# Patient Record
Sex: Male | Born: 1950
Health system: Southern US, Community
[De-identification: ages and names within clinical notes are randomized; demographics above are authoritative.]

## PROBLEM LIST (undated history)

## (undated) DIAGNOSIS — C801 Malignant (primary) neoplasm, unspecified: Secondary | ICD-10-CM

## (undated) DIAGNOSIS — Z973 Presence of spectacles and contact lenses: Secondary | ICD-10-CM

## (undated) DIAGNOSIS — I10 Essential (primary) hypertension: Secondary | ICD-10-CM

## (undated) DIAGNOSIS — K760 Fatty (change of) liver, not elsewhere classified: Secondary | ICD-10-CM

## (undated) DIAGNOSIS — Z8619 Personal history of other infectious and parasitic diseases: Secondary | ICD-10-CM

## (undated) DIAGNOSIS — I2510 Atherosclerotic heart disease of native coronary artery without angina pectoris: Secondary | ICD-10-CM

## (undated) DIAGNOSIS — J302 Other seasonal allergic rhinitis: Secondary | ICD-10-CM

## (undated) DIAGNOSIS — Z87891 Personal history of nicotine dependence: Secondary | ICD-10-CM

## (undated) DIAGNOSIS — E119 Type 2 diabetes mellitus without complications: Secondary | ICD-10-CM

## (undated) DIAGNOSIS — G4733 Obstructive sleep apnea (adult) (pediatric): Secondary | ICD-10-CM

## (undated) DIAGNOSIS — R0902 Hypoxemia: Secondary | ICD-10-CM

## (undated) DIAGNOSIS — Z972 Presence of dental prosthetic device (complete) (partial): Secondary | ICD-10-CM

## (undated) DIAGNOSIS — M1990 Unspecified osteoarthritis, unspecified site: Secondary | ICD-10-CM

## (undated) DIAGNOSIS — H269 Unspecified cataract: Secondary | ICD-10-CM

## (undated) DIAGNOSIS — M8888 Osteitis deformans of other bones: Secondary | ICD-10-CM

## (undated) DIAGNOSIS — R351 Nocturia: Secondary | ICD-10-CM

## (undated) DIAGNOSIS — G4730 Sleep apnea, unspecified: Secondary | ICD-10-CM

## (undated) DIAGNOSIS — K219 Gastro-esophageal reflux disease without esophagitis: Secondary | ICD-10-CM

## (undated) DIAGNOSIS — I219 Acute myocardial infarction, unspecified: Secondary | ICD-10-CM

## (undated) DIAGNOSIS — K635 Polyp of colon: Secondary | ICD-10-CM

## (undated) DIAGNOSIS — I639 Cerebral infarction, unspecified: Secondary | ICD-10-CM

## (undated) DIAGNOSIS — E785 Hyperlipidemia, unspecified: Secondary | ICD-10-CM

## (undated) DIAGNOSIS — J3089 Other allergic rhinitis: Secondary | ICD-10-CM

## (undated) DIAGNOSIS — R21 Rash and other nonspecific skin eruption: Secondary | ICD-10-CM

## (undated) HISTORY — DX: Sleep apnea, unspecified: G47.30

## (undated) HISTORY — DX: Personal history of nicotine dependence: Z87.891

## (undated) HISTORY — DX: Fatty (change of) liver, not elsewhere classified: K76.0

## (undated) HISTORY — PX: ABDOMINAL HERNIA REPAIR: SHX539

## (undated) HISTORY — PX: DENTAL SURGERY: SHX609

## (undated) HISTORY — DX: Atherosclerotic heart disease of native coronary artery without angina pectoris: I25.10

## (undated) HISTORY — DX: Unspecified cataract: H26.9

## (undated) HISTORY — DX: Malignant (primary) neoplasm, unspecified: C80.1

## (undated) HISTORY — DX: Acute myocardial infarction, unspecified: I21.9

## (undated) HISTORY — DX: Personal history of other infectious and parasitic diseases: Z86.19

## (undated) HISTORY — DX: Hyperlipidemia, unspecified: E78.5

## (undated) HISTORY — PX: TONSILLECTOMY: SUR1361

## (undated) HISTORY — PX: APPENDECTOMY: SHX54

## (undated) HISTORY — DX: Hypoxemia: R09.02

## (undated) HISTORY — DX: Osteitis deformans of other bones: M88.88

## (undated) HISTORY — PX: CARDIAC CATHETERIZATION: SHX172

## (undated) HISTORY — DX: Polyp of colon: K63.5

## (undated) HISTORY — DX: Essential (primary) hypertension: I10

## (undated) SURGERY — ANTERIOR CERVICAL DECOMPRESSION/DISCECTOMY FUSION 4 LEVELS

## (undated) SURGERY — LEFT HEART CATH AND CORONARY ANGIOGRAPHY
Anesthesia: Moderate Sedation

## (undated) MED FILL — Dulaglutide Soln Auto-injector 0.75 MG/0.5ML: SUBCUTANEOUS | Fill #6 | Status: CN

---

## 1990-01-29 HISTORY — PX: CERVICAL FUSION: SHX112

## 2004-01-30 HISTORY — PX: CHOLECYSTECTOMY OPEN: SUR202

## 2006-05-13 ENCOUNTER — Ambulatory Visit: Payer: Self-pay | Admitting: Cardiovascular Disease

## 2006-09-13 ENCOUNTER — Ambulatory Visit: Payer: Self-pay | Admitting: Surgery

## 2006-09-16 ENCOUNTER — Ambulatory Visit: Payer: Self-pay | Admitting: Surgery

## 2007-10-03 ENCOUNTER — Encounter: Payer: Self-pay | Admitting: Cardiology

## 2007-10-03 LAB — CONVERTED CEMR LAB
Cholesterol: 138 mg/dL
HDL: 28 mg/dL
LDL Cholesterol: 62 mg/dL
Triglyceride fasting, serum: 192 mg/dL

## 2007-12-02 ENCOUNTER — Ambulatory Visit: Payer: Self-pay | Admitting: Cardiology

## 2007-12-12 ENCOUNTER — Ambulatory Visit: Payer: Self-pay

## 2007-12-22 ENCOUNTER — Ambulatory Visit: Payer: Self-pay | Admitting: Cardiology

## 2007-12-22 LAB — CONVERTED CEMR LAB
BUN: 22 mg/dL (ref 6–23)
CO2: 26 meq/L (ref 19–32)
Calcium: 9.8 mg/dL (ref 8.4–10.5)
Chloride: 102 meq/L (ref 96–112)
Creatinine, Ser: 1.08 mg/dL (ref 0.40–1.50)
Glucose, Bld: 125 mg/dL — ABNORMAL HIGH (ref 70–99)
HCT: 48.3 % (ref 39.0–52.0)
Hemoglobin: 16.4 g/dL (ref 13.0–17.0)
INR: 0.9 (ref 0.0–1.5)
MCHC: 34 g/dL (ref 30.0–36.0)
MCV: 98.4 fL (ref 78.0–100.0)
Platelets: 285 10*3/uL (ref 150–400)
Potassium: 4.5 meq/L (ref 3.5–5.3)
Prothrombin Time: 12.2 s (ref 11.6–15.2)
RBC: 4.91 M/uL (ref 4.22–5.81)
RDW: 12.5 % (ref 11.5–15.5)
Sodium: 141 meq/L (ref 135–145)
WBC: 8.1 10*3/uL (ref 4.0–10.5)

## 2007-12-23 ENCOUNTER — Ambulatory Visit: Payer: Self-pay | Admitting: Cardiology

## 2008-02-03 ENCOUNTER — Ambulatory Visit: Payer: Self-pay | Admitting: Cardiology

## 2008-02-24 ENCOUNTER — Ambulatory Visit: Payer: Self-pay | Admitting: General Surgery

## 2008-12-14 ENCOUNTER — Encounter: Payer: Self-pay | Admitting: Cardiology

## 2009-02-15 ENCOUNTER — Ambulatory Visit: Payer: Self-pay | Admitting: Cardiology

## 2009-02-15 DIAGNOSIS — R0789 Other chest pain: Secondary | ICD-10-CM | POA: Insufficient documentation

## 2009-02-15 DIAGNOSIS — R5383 Other fatigue: Secondary | ICD-10-CM

## 2009-02-15 DIAGNOSIS — I2510 Atherosclerotic heart disease of native coronary artery without angina pectoris: Secondary | ICD-10-CM | POA: Insufficient documentation

## 2009-02-15 DIAGNOSIS — I200 Unstable angina: Secondary | ICD-10-CM | POA: Insufficient documentation

## 2009-02-15 DIAGNOSIS — I10 Essential (primary) hypertension: Secondary | ICD-10-CM | POA: Insufficient documentation

## 2009-02-15 DIAGNOSIS — R0602 Shortness of breath: Secondary | ICD-10-CM | POA: Insufficient documentation

## 2009-02-15 DIAGNOSIS — R5381 Other malaise: Secondary | ICD-10-CM | POA: Insufficient documentation

## 2009-02-15 DIAGNOSIS — I209 Angina pectoris, unspecified: Secondary | ICD-10-CM | POA: Insufficient documentation

## 2010-01-19 ENCOUNTER — Encounter: Payer: Self-pay | Admitting: Cardiovascular Disease

## 2010-02-14 ENCOUNTER — Ambulatory Visit
Admission: RE | Admit: 2010-02-14 | Discharge: 2010-02-14 | Payer: Self-pay | Source: Home / Self Care | Attending: Cardiovascular Disease | Admitting: Cardiovascular Disease

## 2010-02-14 ENCOUNTER — Encounter: Payer: Self-pay | Admitting: Cardiovascular Disease

## 2010-02-28 NOTE — Assessment & Plan Note (Signed)
Summary: F1Y/AMD   Visit Type:  Follow-up Primary Provider:  Gelene Mink, MD  CC:  no cp and maybe once a month maybe a touch of pressure. no sob. no edema.  History of Present Illness: Casey Golden returns today for evaluation and management of his coronary artery disease, mixed hyperlipidemia, hypertension.  He occasionally has a little chest tightness in the center of his chest about the size of a half-dollar. It is not clearly exertion related. He denies any dyspnea on exertion or other ischemic equivalent.  He does have some occasional dyspepsia when he eats hot foods. October take p.r.n. Pepcid or ranitidine.  His weight is up and so is his blood pressure today. He had saw when he cooks. He seems to avoid other types of hidden salt.  Hip positive Myoview several years ago which resulted in a heart catheterization. He had nonobstructive disease. His normal left ventricular function.  Recent blood work showed total cholesterol 152, HDL 36, triglycerides 195, LDL 77, blood sugar 136.  Preventive Screening-Counseling & Management  Alcohol-Tobacco     Alcohol drinks/day: 0     Smoking Status: quit  Caffeine-Diet-Exercise     Caffeine use/day: 1     Does Patient Exercise: yes  Current Medications (verified): 1)  Simcor 1000-20 Mg Xr24h-Tab (Niacin-Simvastatin) .Marland Kitchen.. 1 By Mouth Once Daily 2)  Tricor 145 Mg Tabs (Fenofibrate) .Marland Kitchen.. 1 By Mouth Once Daily 3)  Testim 1 % Gel (Testosterone) .... As Directed 4)  Atenolol 25 Mg Tabs (Atenolol) .... Take One Tablet By Mouth Daily 5)  Aspirin 81 Mg Tbec (Aspirin) .... Take One Tablet By Mouth Daily 6)  Vitamin D 2000 Unit Tabs (Cholecalciferol) .Marland Kitchen.. 1 By Mouth Once Daily 7)  Cinnamon 500 Mg Caps (Cinnamon) .Marland Kitchen.. 1 By Mouth Two Times A Day 8)  Lovaza 1 Gm Caps (Omega-3-Acid Ethyl Esters) .... 2 By Mouth Two Times A Day 9)  Tylenol 325 Mg Tabs (Acetaminophen) .... As Needed 10)  Benadryl 25 Mg Caps (Diphenhydramine Hcl) .Marland Kitchen.. 1 By Mouth As  Needed 11)  Zegerid 40-1100 Mg Caps (Omeprazole-Sodium Bicarbonate) .Marland Kitchen.. 1 By Mouth As Needed - For Chest Tightness  Allergies (verified): 1)  ! Sulfa  Social History: Alcohol drinks/day:  0 Caffeine use/day:  1 Does Patient Exercise:  yes  Review of Systems       negative other than history of present illness  Vital Signs:  Patient profile:   60 year old male Height:      71 inches Weight:      264.50 pounds BMI:     37.02 Pulse rate:   65 / minute Pulse rhythm:   regular BP sitting:   162 / 80  (left arm) Cuff size:   large  Vitals Entered By: Charlena Cross, RN, BSN (February 15, 2009 10:31 AM)  Physical Exam  General:  obese.  obese.   Head:  normocephalic and atraumatic Eyes:  PERRLA/EOM intact; conjunctiva and lids normal. Mouth:  Teeth, gums and palate normal. Oral mucosa normal. Neck:  Neck supple, no JVD. No masses, thyromegaly or abnormal cervical nodes. Chest Shaquoya Cosper:  no deformities or breast masses noted Lungs:  Clear bilaterally to auscultation and percussion. Heart:  PMI difficult to appreciate, normal S1-S2, no S4, regular rate and rhythm, no carotid bruit Abdomen:  Bowel sounds positive; abdomen soft and non-tender without masses, organomegaly, or hernias noted. No hepatosplenomegaly. Msk:  Back normal, normal gait. Muscle strength and tone normal. Pulses:  pulses normal in all 4 extremities  Extremities:  No clubbing or cyanosis. Neurologic:  Alert and oriented x 3.   Problems:  Medical Problems Added: 1)  Dx of Hypertension, Unspecified  (ICD-401.9) 2)  Dx of Cad, Native Vessel  (ICD-414.01) 3)  Dx of Other Chest Pain  (ICD-786.59) 4)  Dx of Fatigue  (ICD-780.79) 5)  Dx of Shortness of Breath  (ICD-786.05) 6)  Dx of Cad  (ICD-414.00)  EKG  Procedure date:  02/15/2009  Findings:      normal sinus rhythm, normal EKG  Impression & Recommendations:  Problem # 1:  OTHER CHEST PAIN (ICD-786.59) Assessment New I do not think this is cardiac  or coronary related. I reviewed the symptoms of angina or acute cardiac syndrome. I do not see any value repeating a stress nuclear study since it was a false positive in the past. I'll see him back in one year. His updated medication list for this problem includes:    Atenolol 25 Mg Tabs (Atenolol) .Marland Kitchen... Take one tablet by mouth daily    Aspirin 81 Mg Tbec (Aspirin) .Marland Kitchen... Take one tablet by mouth daily  Problem # 2:  CAD, NATIVE VESSEL (ICD-414.01) Assessment: Unchanged  His updated medication list for this problem includes:    Atenolol 25 Mg Tabs (Atenolol) .Marland Kitchen... Take one tablet by mouth daily    Aspirin 81 Mg Tbec (Aspirin) .Marland Kitchen... Take one tablet by mouth daily  Problem # 3:  HYPERTENSION, UNSPECIFIED (ICD-401.9) Assessment: New His blood pressures clearly elevated currently systolically. I rechecked it with a large cuff in the left arm and we left. I spent about 15 minutes talking about dietary restriction and sodium, losing about 15-20 pounds which also helped his blood sugar, and walking about 3 hours per week. He has a blood pressure cuff and will continue to check it. I have told him if it runs above 140 that he needs medical treatment. He will follow with his primary care physician. His updated medication list for this problem includes:    Atenolol 25 Mg Tabs (Atenolol) .Marland Kitchen... Take one tablet by mouth daily    Aspirin 81 Mg Tbec (Aspirin) .Marland Kitchen... Take one tablet by mouth daily  Patient Instructions: 1)  Your physician recommends that you schedule a follow-up appointment in: 1 YEAR 2)  Your physician has requested that you regularly monitor and record your blood pressure readings at home.  Please use the same machine at the same time of day to check your readings and record them to bring to your follow-up visit.

## 2010-03-02 NOTE — Assessment & Plan Note (Signed)
Summary: F/U 6 months   Visit Type:  Follow-up Primary Provider:  Dr.Neimeyer  CC:  "doing well". Denies chest pain and SOB.  History of Present Illness: Casey Golden returns today for evaluation and management of his coronary artery disease, mixed hyperlipidemia, hypertension. he does report a syncopal episode many years ago while walking his dog. He was down for one minute. He had no warning. He has not had any further episodes since that time  Casey Golden reports that he is doing well. He is walking his dog on a frequent basis but no chest pain, no shortness of breath. He has no edema. Overall he has no new complaints. He did hurt his back lifting something.  EKG shows normal sinus rhythm with rate 57 beats per minute, no significant ST or T wave changes  He had a positive Myoview several years ago which resulted in a heart catheterization. He had nonobstructive disease. His normal left ventricular function.  Recent blood work showed total cholesterol 111, LDL 58, HDL 30, triglycerides 117  Current Medications (verified): 1)  Tricor 145 Mg Tabs (Fenofibrate) .Marland Kitchen.. 1 By Mouth Once Daily 2)  Testim 1 % Gel (Testosterone) .... As Directed 3)  Atenolol 25 Mg Tabs (Atenolol) .... Take One Tablet By Mouth Daily 4)  Aspirin 81 Mg Tbec (Aspirin) .... Take One Tablet By Mouth Daily 5)  Vitamin D 2000 Unit Tabs (Cholecalciferol) .Marland Kitchen.. 1 By Mouth Once Daily 6)  Lovaza 1 Gm Caps (Omega-3-Acid Ethyl Esters) .... 2 By Mouth Two Times A Day 7)  Benadryl 25 Mg Caps (Diphenhydramine Hcl) .Marland Kitchen.. 1 By Mouth As Needed 8)  Zegerid 40-1100 Mg Caps (Omeprazole-Sodium Bicarbonate) .Marland Kitchen.. 1 By Mouth As Needed - For Chest Tightness 9)  Lipitor 40 Mg Tabs (Atorvastatin Calcium) .Marland Kitchen.. 1 Tablet Daily 10)  Lisinopril 5 Mg Tabs (Lisinopril) .Marland Kitchen.. 1 Tablet Daily 11)  Metformin Hcl 500 Mg Tabs (Metformin Hcl) .Marland Kitchen.. 1 Tablet Two Times A Day 12)  Centrum Silver  Tabs (Multiple Vitamins-Minerals) .Marland Kitchen.. 1 Tablet Daily  Allergies  (verified): 1)  ! Sulfa  Past History:  Past Medical History: Last updated: 12/14/2008 Hyperlipidemia Hypertension  Past Surgical History: Last updated: 12/14/2008 Appendectomy Cardiac Catheterization x2 Cholecystectomy Tonsillectomy  Family History: Last updated: 12/14/2008 negative for premature coronary disease  Social History: Last updated: 12/14/2008 Full Time Married  Tobacco Use - Former: quit 1985 Alcohol Use - no Drug Use - no  Risk Factors: Alcohol Use: 0 (02/15/2009) Caffeine Use: 1 (02/15/2009) Exercise: yes (02/15/2009)  Risk Factors: Smoking Status: quit (02/15/2009)  Review of Systems  The patient denies fever, weight loss, weight gain, vision loss, decreased hearing, hoarseness, chest pain, syncope, dyspnea on exertion, peripheral edema, prolonged cough, abdominal pain, incontinence, muscle weakness, depression, and enlarged lymph nodes.         back pain  Vital Signs:  Patient profile:   60 year old male Height:      71 inches Weight:      263.75 pounds BMI:     36.92 Pulse rate:   57 / minute BP sitting:   153 / 79  (left arm) Cuff size:   large  Vitals Entered By: Lysbeth Galas CMA (February 14, 2010 10:08 AM)  Physical Exam  General:  Well developed, well nourished, in no acute distress. Head:  normocephalic and atraumatic Neck:  Neck supple, no JVD. No masses, thyromegaly or abnormal cervical nodes. Lungs:  Clear bilaterally to auscultation and percussion. Heart:  Non-displaced PMI, chest non-tender; regular rate  and rhythm, S1, S2 without murmurs, rubs or gallops. Carotid upstroke normal, no bruit.  Pedals normal pulses. No edema, no varicosities. Abdomen:  Bowel sounds positive; abdomen soft and non-tender without masses Msk:  Back normal, normal gait. Muscle strength and tone normal. Pulses:  pulses normal in all 4 extremities Extremities:  No clubbing or cyanosis. Neurologic:  Alert and oriented x 3. Skin:  Intact without  lesions or rashes. Psych:  Normal affect.   Impression & Recommendations:  Problem # 1:  CAD, NATIVE VESSEL (ICD-414.01)  mild nonobstructive coronary artery disease by remote cardiac catheterization. Continue aggressive medical management. Encouraged exercise and weight loss. Diabetes is under excellent control with hemoglobin A1c 6.3. He is on aspirin. Nonsmoker..  His updated medication list for this problem includes:    Atenolol 25 Mg Tabs (Atenolol) .Marland Kitchen... Take one tablet by mouth daily    Aspirin 81 Mg Tbec (Aspirin) .Marland Kitchen... Take one tablet by mouth daily    Lisinopril 5 Mg Tabs (Lisinopril) .Marland Kitchen... 1 tablet daily  Orders: EKG w/ Interpretation (93000)  His updated medication list for this problem includes:    Atenolol 25 Mg Tabs (Atenolol) .Marland Kitchen... Take one tablet by mouth daily    Aspirin 81 Mg Tbec (Aspirin) .Marland Kitchen... Take one tablet by mouth daily    Lisinopril 5 Mg Tabs (Lisinopril) .Marland Kitchen... 1 tablet daily  Problem # 2:  HYPERTENSION, UNSPECIFIED (ICD-401.9)  Repeat blood pressure was 140/70. Her last and to monitor his blood pressure at home and call us for systolic pressures greater than 135. We would increase his lisinopril if needed.  His updated medication list for this problem includes:    Atenolol 25 Mg Tabs (Atenolol) .Marland Kitchen... Take one tablet by mouth daily    Aspirin 81 Mg Tbec (Aspirin) .Marland Kitchen... Take one tablet by mouth daily    Lisinopril 5 Mg Tabs (Lisinopril) .Marland Kitchen... 1 tablet daily  His updated medication list for this problem includes:    Atenolol 25 Mg Tabs (Atenolol) .Marland Kitchen... Take one tablet by mouth daily    Aspirin 81 Mg Tbec (Aspirin) .Marland Kitchen... Take one tablet by mouth daily    Lisinopril 5 Mg Tabs (Lisinopril) .Marland Kitchen... 1 tablet daily  Problem # 3:  HYPERLIPIDEMIA-MIXED (ICD-272.4)  Cholesterol is under excellent control. Continue Lipitor.  The following medications were removed from the medication list:    Simcor 1000-20 Mg Xr24h-tab (Niacin-simvastatin) .Marland Kitchen... 1 by mouth  once daily His updated medication list for this problem includes:    Tricor 145 Mg Tabs (Fenofibrate) .Marland Kitchen... 1 by mouth once daily    Lovaza 1 Gm Caps (Omega-3-acid ethyl esters) .Marland Kitchen... 2 by mouth two times a day    Lipitor 40 Mg Tabs (Atorvastatin calcium) .Marland Kitchen... 1 tablet daily  The following medications were removed from the medication list:    Simcor 1000-20 Mg Xr24h-tab (Niacin-simvastatin) .Marland Kitchen... 1 by mouth once daily His updated medication list for this problem includes:    Tricor 145 Mg Tabs (Fenofibrate) .Marland Kitchen... 1 by mouth once daily    Lovaza 1 Gm Caps (Omega-3-acid ethyl esters) .Marland Kitchen... 2 by mouth two times a day    Lipitor 40 Mg Tabs (Atorvastatin calcium) .Marland Kitchen... 1 tablet daily  Patient Instructions: 1)  Your physician recommends that you schedule a follow-up appointment in: 1 year 2)  Your physician recommends that you continue on your current medications as directed. Please refer to the Current Medication list given to you today.

## 2010-06-13 NOTE — Assessment & Plan Note (Signed)
[facility] OFFICE NOTE   NAME:LEESherard, Sutch                          MRN:          [id number]  DATE:12/02/2007                            DOB:          May 31, 1950    I was asked by Dr. Gelene Mink to consult on Gillermo Murdoch with chest  discomfort and shortness of breath.   HISTORY OF PRESENT ILLNESS:  [name] is a 60 year old married white male  who has a history of coronary disease.  He has been having exertional  chest tightness with excess fatigue and shortness of breath.  This has  been going on for several months.  Interestingly, it does not happen on  a daily basis.  He has good days and bad days as he puts it.   He denies any orthopnea, PND, palpitations, presyncope, or syncope.  He  has had no peripheral edema.   Looking back through his records, he has had a catheterization at  [facility] in April 2008.  At that time, he had a  50% right coronary artery stenosis with normal circumflex and normal  right coronary artery.  His EF was 65% with normal left ventricular  function.  Prior to that catheterization, there was a question of an  inferior scar on the stress nuclear study.  In addition, he had a 2-D  echocardiogram that was normal.   He is intolerant of SULFA.   He does not smoke anymore.  He quit about 15 years ago.  He does not  drink alcohol.  He drinks very little coffee.  He enjoys walking 2-4  miles per day.   His current meds are:  1. Simcor 1000 mg/20 daily.  2. Tricor 145 mg a day.  3. Atenolol 25 mg a day.  4. Lovaza 4 daily.  5. Aspirin 81 mg a day.  6. Vitamin D 2000 units a day.   He has had a previous cholecystectomy, 2 catheterizations in the past  with no intervention, tonsillectomy, and appendectomy.   His history is significant for hypertension and hyperlipidemia.   Family history is negative for premature coronary disease.   SOCIAL HISTORY:   He is married and has 3 children.  He works at [employer] in [employer].   His review of systems are totally negative other than the HPI.  All  systems were queried.   PHYSICAL EXAMINATION:  VITAL SIGNS:  His blood pressure today is 146/86,  his pulse is 68 and regular.  He is 5 feet 11 inches, weighs 262 pounds.  HEENT:  Normal.  He wears glasses.  He has a ruddy complexion.  SKIN:  Warm and dry, otherwise.  NECK:  Supple.  Carotid upstrokes were equal bilateral bruits without  JVD.  Thyroid is not enlarged.  Trachea is midline.  LUNGS:  Clear to auscultation and percussion.  HEART:  Poorly appreciated PMI.  He is a big, muscular, thick male in  the chest.  He has a soft S1 and S2.  No murmur, rub, or gallop.  ABDOMEN:  Protuberant,  good bowel sounds.  No obvious midline bruit or  hepatomegaly.  There is no pulsatile mass.  EXTREMITIES:  There were no cyanosis, clubbing, or edema.  Pulses were  bilaterally symmetrical and present.  There was no sign of DVT.  NEUROLOGIC:  Intact.   An EKG in [facility] office on October 03, 2007, showed normal sinus  rhythm with first-degree AV block, but no sign of previous infarct.   ASSESSMENT AND PLAN:  Mr. Marcott has inconsistent symptoms of coronary  ischemia with exertion.  He does have a history of 50% right coronary  artery stenosis with normal left ventricular function in April 2008 by a  cardiac catheterization at [facility].  After a  long discussion, I think a stress nuclear study would be helpful.  If he  has inferior wall thinning, normal left ventricular function, no  significant ischemia, we would just reassure him and maintain current  secondary preventative strategies.   After a long discussion, he agrees to proceed.  We will arrange for this  in the next week or so.     Thomas C. Daleen Squibb, MD, [facility]  Electronically Signed    TCW/MedQ  DD: 12/02/2007  DT: 12/02/2007  Job #: [id number]   cc:   Galen Daft. Timoteo Gaul, MD

## 2010-06-13 NOTE — Assessment & Plan Note (Signed)
[facility] OFFICE NOTE   NAME:Casey Golden, Zimmerle                          MRN:          [id number]  DATE:02/03/2008                            DOB:          Mar 05, 1950    [name] returns today for followup after having a cardiac  catheterization.  I saw him on December 02, 2007.  He had had some  exertional chest tightness and excess fatigue and shortness of breath.  He had known coronary artery disease with a 50% lesion in the right  coronary artery.   We performed a stress Myoview, which showed relatively good exercise  tolerance; however, he did have some inferior wall ischemia at the mid  and basal level.  He had a hypertensive blood pressure response to  exercise.  His EF was 65%.   For this reason, he underwent cardiac catheterization at [facility] on December 23, 2007.  This showed a 40% mid right  coronary artery stenosis with nonobstructive and no significant plaque  in his left system.  His EF was 55%.   He has had no problems with the cath site.  He is on excellent medical  program for secondary prevention.   His blood pressure today is 146/88, his pulse is 74 and regular, his  weight is 264.  Rest of the exam is unchanged.   I spent 10 minutes or so talking to Mr. Pieczynski about secondary prevention  including decreasing plaque progression as well as the risk of plaque  rupture.  We also talked about acute coronary syndromes and their  associated symptoms, and how to respond with 911.   Assuming he is stable, we will see him back in a year.     Thomas C. Daleen Squibb, MD, [facility]  Electronically Signed    TCW/MedQ  DD: 02/03/2008  DT: 02/04/2008  Job #: [id number]   cc:   Dr. Gelene Mink  [facility]

## 2010-07-14 ENCOUNTER — Encounter: Payer: Self-pay | Admitting: Cardiovascular Disease

## 2011-03-12 ENCOUNTER — Ambulatory Visit: Payer: Self-pay | Admitting: Cardiovascular Disease

## 2011-05-01 ENCOUNTER — Ambulatory Visit (INDEPENDENT_AMBULATORY_CARE_PROVIDER_SITE_OTHER): Payer: BC Managed Care – PPO | Admitting: Cardiovascular Disease

## 2011-05-01 ENCOUNTER — Encounter: Payer: Self-pay | Admitting: Cardiovascular Disease

## 2011-05-01 VITALS — BP 158/90 | HR 64 | Resp 18 | Ht 71.0 in | Wt 240.1 lb

## 2011-05-01 DIAGNOSIS — I10 Essential (primary) hypertension: Secondary | ICD-10-CM

## 2011-05-01 DIAGNOSIS — R0602 Shortness of breath: Secondary | ICD-10-CM

## 2011-05-01 DIAGNOSIS — R0789 Other chest pain: Secondary | ICD-10-CM

## 2011-05-01 DIAGNOSIS — I2510 Atherosclerotic heart disease of native coronary artery without angina pectoris: Secondary | ICD-10-CM

## 2011-05-01 DIAGNOSIS — E785 Hyperlipidemia, unspecified: Secondary | ICD-10-CM

## 2011-05-01 MED ORDER — LISINOPRIL 10 MG PO TABS: 10.0000 mg | ORAL_TABLET | Freq: Every day | ORAL | Status: DC

## 2011-05-01 NOTE — Assessment & Plan Note (Signed)
Cholesterol is at goal on the current lipid regimen. No changes to the medications were made.  

## 2011-05-01 NOTE — Assessment & Plan Note (Signed)
Currently with no symptoms of angina. No further workup at this time. Continue current medication regimen. 

## 2011-05-01 NOTE — Progress Notes (Signed)
Patient ID: Casey Golden, male    DOB: 11-05-1950, 61 y.o.   MRN: [id number]  HPI Comments: Casey Golden returns today for evaluation and management of his coronary artery disease, history of smoking for 27 years,  mixed hyperlipidemia, hypertension. he does report a syncopal episode many years ago while walking his dog. He was down for one minute. He had no warning. He has not had any further episodes since that time   Casey Golden reports that he is doing well. He is walking his dog on a frequent basis but no chest pain, no shortness of breath. He has no edema. Overall he has no new complaints. he has started to use a total gym at home for exercise. Blood pressure is typically well controlled at home.   EKG shows normal sinus rhythm with rate 60 beats per minute, no significant ST or T wave changes, poor R wave progression through the anterior precordial leads   He had a positive Myoview several years ago which resulted in a heart catheterization. He had nonobstructive disease. His normal left ventricular function.   total cholesterol 111, LDL 58, HDL 30, triglycerides 117      Outpatient Encounter Prescriptions as of 05/01/2011  Medication Sig Dispense Refill  . aspirin 81 MG EC tablet Take 81 mg by mouth daily.        Marland Kitchen atenolol (TENORMIN) 25 MG tablet Take 25 mg by mouth daily.        Marland Kitchen atorvastatin (LIPITOR) 40 MG tablet Take 40 mg by mouth daily. Take 1/2 tablet by mouth daily.       . Cholecalciferol (VITAMIN D) 2000 UNITS tablet Take 2,000 Units by mouth daily.        . fenofibrate (TRICOR) 145 MG tablet Take 145 mg by mouth daily.        Marland Kitchen lisinopril (PRINIVIL,ZESTRIL) 10 MG tablet Take 1 tablet (10 mg total) by mouth daily.  90 tablet  3  . metFORMIN (GLUCOPHAGE) 500 MG tablet Take 500 mg by mouth 2 (two) times daily with a meal.        . omega-3 acid ethyl esters (LOVAZA) 1 G capsule Take 2 capsules by mouth 2 (two) times daily.        Marland Kitchen DISCONTD: lisinopril (PRINIVIL,ZESTRIL) 5 MG tablet  Take 5 mg by mouth daily.        . diphenhydrAMINE (SOMINEX) 25 MG tablet Take 25 mg by mouth as needed.        . Multiple Vitamins-Minerals (CENTRUM SILVER) tablet Take 1 tablet by mouth daily.        Marland Kitchen omeprazole-sodium bicarbonate (ZEGERID) 40-1100 MG per capsule Take 1 capsule by mouth as needed.        . testosterone (TESTIM) 50 MG/5GM GEL Place 5 g onto the skin as directed.           Review of Systems  Constitutional: Negative.   HENT: Negative.   Eyes: Negative.   Respiratory: Negative.   Cardiovascular: Negative.   Gastrointestinal: Negative.   Musculoskeletal: Negative.   Skin: Negative.   Neurological: Negative.   Hematological: Negative.   Psychiatric/Behavioral: Negative.   All other systems reviewed and are negative.    BP 158/90  Pulse 64  Resp 18  Ht 5\' 11"  (1.803 m)  Wt 240 lb 1.9 oz (108.918 kg)  BMI 33.49 kg/m2 He reports blood pressure is elevated because he was arguing with his wife this morning Physical Exam  Nursing note and vitals reviewed.  Constitutional: He is oriented to person, place, and time. He appears well-developed and well-nourished.  HENT:  Head: Normocephalic.  Nose: Nose normal.  Mouth/Throat: Oropharynx is clear and moist.  Eyes: Conjunctivae are normal. Pupils are equal, round, and reactive to light.  Neck: Normal range of motion. Neck supple. No JVD present.  Cardiovascular: Normal rate, regular rhythm, S1 normal, S2 normal, normal heart sounds and intact distal pulses.  Exam reveals no gallop and no friction rub.   No murmur heard. Pulmonary/Chest: Effort normal and breath sounds normal. No respiratory distress. He has no wheezes. He has no rales. He exhibits no tenderness.  Abdominal: Soft. Bowel sounds are normal. He exhibits no distension. There is no tenderness.  Musculoskeletal: Normal range of motion. He exhibits no edema and no tenderness.  Lymphadenopathy:    He has no cervical adenopathy.  Neurological: He is alert and  oriented to person, place, and time. Coordination normal.  Skin: Skin is warm and dry. No rash noted. No erythema.  Psychiatric: He has a normal mood and affect. His behavior is normal. Judgment and thought content normal.           Assessment and Plan

## 2011-05-01 NOTE — Assessment & Plan Note (Signed)
Blood pressure is elevated today which he attributes to an argument with his wife. Typically at home he reports better blood pressure. We have suggested he increase his lisinopril to 10 mg daily and monitor his blood pressure.

## 2011-05-01 NOTE — Patient Instructions (Signed)
You are doing well. No medication changes were made.  Please call us if you have new issues that need to be addressed before your next appt.  Your physician wants you to follow-up in: 12 months.  You will receive a reminder letter in the mail two months in advance. If you don't receive a letter, please call our office to schedule the follow-up appointment. 

## 2012-03-31 ENCOUNTER — Ambulatory Visit: Payer: BC Managed Care – PPO | Admitting: Adult Health

## 2012-04-25 ENCOUNTER — Other Ambulatory Visit: Payer: Self-pay | Admitting: Cardiovascular Disease

## 2012-04-25 NOTE — Telephone Encounter (Signed)
Refilled Lisinopril sent to [facility] pharmacy. 

## 2012-05-19 ENCOUNTER — Ambulatory Visit: Payer: BC Managed Care – PPO | Admitting: Cardiovascular Disease

## 2012-05-27 ENCOUNTER — Ambulatory Visit (INDEPENDENT_AMBULATORY_CARE_PROVIDER_SITE_OTHER): Payer: Managed Care, Other (non HMO) | Admitting: Cardiovascular Disease

## 2012-05-27 ENCOUNTER — Encounter: Payer: Self-pay | Admitting: Cardiovascular Disease

## 2012-05-27 VITALS — BP 130/80 | HR 61 | Ht 71.0 in | Wt 258.5 lb

## 2012-05-27 DIAGNOSIS — R0789 Other chest pain: Secondary | ICD-10-CM

## 2012-05-27 DIAGNOSIS — E785 Hyperlipidemia, unspecified: Secondary | ICD-10-CM

## 2012-05-27 DIAGNOSIS — I10 Essential (primary) hypertension: Secondary | ICD-10-CM

## 2012-05-27 DIAGNOSIS — I2510 Atherosclerotic heart disease of native coronary artery without angina pectoris: Secondary | ICD-10-CM

## 2012-05-27 DIAGNOSIS — R0602 Shortness of breath: Secondary | ICD-10-CM

## 2012-05-27 MED ORDER — AZITHROMYCIN 250 MG PO TABS: ORAL_TABLET | ORAL | Status: DC

## 2012-05-27 MED ORDER — LISINOPRIL 10 MG PO TABS: 10.0000 mg | ORAL_TABLET | Freq: Every day | ORAL | Status: DC

## 2012-05-27 MED ORDER — ALBUTEROL SULFATE HFA 108 (90 BASE) MCG/ACT IN AERS: 2.0000 | INHALATION_SPRAY | Freq: Four times a day (QID) | RESPIRATORY_TRACT | Status: DC | PRN

## 2012-05-27 NOTE — Assessment & Plan Note (Signed)
Currently with no symptoms of angina. No further workup at this time. Continue current medication regimen. 

## 2012-05-27 NOTE — Assessment & Plan Note (Signed)
Recent shortness of breath consistent with bronchitis. Worsening cough. We'll start him on Z-Pak and give him albuterol inhaler as needed for chest tightness

## 2012-05-27 NOTE — Progress Notes (Signed)
Patient ID: Casey Golden, male    DOB: 1950-08-27, 62 y.o.   MRN: [id number]  HPI Comments: Casey Golden is a very pleasant 62 year old gentleman returns today for evaluation and management of his coronary artery disease, history of smoking for 27 years,  mixed hyperlipidemia, hypertension. he does report a syncopal episode many years ago while walking his dog. He was down for one minute. He had no warning. He has not had any further episodes since that time   Casey Golden has been very active. In the past week, has had worsening congestion, cough, chest tightness. In the past with similar symptoms he was diagnosed with bronchitis and required antibiotics. Also used inhalers in the past for similar symptoms and symptom relief. Significant cough keeping him awake. Denies any chest pain with exertion. Otherwise feels well.   EKG shows normal sinus rhythm with rate 61 beats per minute, no significant ST or T wave changes   He had a positive Myoview several years ago which resulted in a heart catheterization. He had nonobstructive disease. His normal left ventricular function.   total cholesterol 111, LDL 58, HDL 30, triglycerides 117      Outpatient Encounter Prescriptions as of 05/27/2012  Medication Sig Dispense Refill  . aspirin 81 MG EC tablet Take 81 mg by mouth daily.        Marland Kitchen atenolol (TENORMIN) 25 MG tablet Take 25 mg by mouth daily.        Marland Kitchen atorvastatin (LIPITOR) 40 MG tablet Take 20 mg by mouth daily.       . Cholecalciferol (VITAMIN D) 2000 UNITS tablet Take 2,000 Units by mouth daily.        . diphenhydrAMINE (SOMINEX) 25 MG tablet Take 25 mg by mouth as needed.        . fenofibrate (TRICOR) 145 MG tablet Take 145 mg by mouth daily.        Marland Kitchen lisinopril (PRINIVIL,ZESTRIL) 10 MG tablet TAKE 1 TABLET (10 MG TOTAL) BY MOUTH DAILY.  90 tablet  3  . metFORMIN (GLUCOPHAGE) 500 MG tablet Take 500 mg by mouth 2 (two) times daily with a meal.        . Multiple Vitamins-Minerals (CENTRUM SILVER) tablet  Take 1 tablet by mouth daily.        Marland Kitchen omega-3 acid ethyl esters (LOVAZA) 1 G capsule Take 2 capsules by mouth 2 (two) times daily.        . [DISCONTINUED] omeprazole-sodium bicarbonate (ZEGERID) 40-1100 MG per capsule Take 1 capsule by mouth as needed.        . [DISCONTINUED] testosterone (TESTIM) 50 MG/5GM GEL Place 5 g onto the skin as directed.         No facility-administered encounter medications on file as of 05/27/2012.     Review of Systems  Constitutional: Negative.   HENT: Negative.   Eyes: Negative.   Respiratory: Positive for cough, chest tightness and shortness of breath.   Cardiovascular: Negative.   Gastrointestinal: Negative.   Musculoskeletal: Negative.   Skin: Negative.   Neurological: Negative.   Psychiatric/Behavioral: Negative.   All other systems reviewed and are negative.    BP 130/80  Pulse 61  Ht 5\' 11"  (1.803 m)  Wt 258 lb 8 oz (117.255 kg)  BMI 36.07 kg/m2  Physical Exam  Nursing note and vitals reviewed. Constitutional: He is oriented to person, place, and time. He appears well-developed and well-nourished.  HENT:  Head: Normocephalic.  Nose: Nose normal.  Mouth/Throat: Oropharynx is  clear and moist.  Eyes: Conjunctivae are normal. Pupils are equal, round, and reactive to light.  Neck: Normal range of motion. Neck supple. No JVD present.  Cardiovascular: Normal rate, regular rhythm, S1 normal, S2 normal, normal heart sounds and intact distal pulses.  Exam reveals no gallop and no friction rub.   No murmur heard. Pulmonary/Chest: Effort normal. No respiratory distress. He has no wheezes. He has no rales. He exhibits no tenderness.  Coarse upper airway breath sounds bilaterally  Abdominal: Soft. Bowel sounds are normal. He exhibits no distension. There is no tenderness.  Musculoskeletal: Normal range of motion. He exhibits no edema and no tenderness.  Lymphadenopathy:    He has no cervical adenopathy.  Neurological: He is alert and oriented to  person, place, and time. Coordination normal.  Skin: Skin is warm and dry. No rash noted. No erythema.  Psychiatric: He has a normal mood and affect. His behavior is normal. Judgment and thought content normal.      Assessment and Plan

## 2012-05-27 NOTE — Patient Instructions (Addendum)
Please start Z-pak 2 the first day then one a day until you run out Use albuterol inhaler for shortness of breath  Please call us if you have new issues that need to be addressed before your next appt.  Your physician wants you to follow-up in: 6 months.  You will receive a reminder letter in the mail two months in advance. If you don't receive a letter, please call our office to schedule the follow-up appointment.

## 2012-05-27 NOTE — Assessment & Plan Note (Signed)
Blood pressure is well controlled on today's visit. No changes made to the medications. 

## 2012-05-27 NOTE — Assessment & Plan Note (Signed)
Cholesterol is at goal on the current lipid regimen. No changes to the medications were made.  

## 2012-09-16 ENCOUNTER — Ambulatory Visit: Payer: Self-pay | Admitting: Family Medicine

## 2012-09-19 ENCOUNTER — Encounter: Payer: Self-pay | Admitting: Cardiovascular Disease

## 2012-09-19 ENCOUNTER — Ambulatory Visit (INDEPENDENT_AMBULATORY_CARE_PROVIDER_SITE_OTHER): Payer: Managed Care, Other (non HMO) | Admitting: Cardiovascular Disease

## 2012-09-19 VITALS — BP 124/72 | HR 62 | Ht 71.0 in | Wt 255.0 lb

## 2012-09-19 DIAGNOSIS — R0602 Shortness of breath: Secondary | ICD-10-CM

## 2012-09-19 DIAGNOSIS — R0789 Other chest pain: Secondary | ICD-10-CM

## 2012-09-19 DIAGNOSIS — I2510 Atherosclerotic heart disease of native coronary artery without angina pectoris: Secondary | ICD-10-CM

## 2012-09-19 DIAGNOSIS — I10 Essential (primary) hypertension: Secondary | ICD-10-CM

## 2012-09-19 NOTE — Assessment & Plan Note (Signed)
Currently with no symptoms of angina. No further workup at this time. Continue current medication regimen. 

## 2012-09-19 NOTE — Assessment & Plan Note (Signed)
I suspect his shortness of breath could be from mild COPD. He did report previous improvement on Symbicort, symptoms resolved at that time. He is unable to stay on Symbicort secondary to price. Have asked him to talk with Dr. Suzie Portela about a substitute. Albuterol does help but for a short period of time.  If he continues to have shortness of breath despite inhalers, we mentioned that additional cardiac testing to be done such as stress testing. He reports that he sees Dr. Suzie Portela next week.

## 2012-09-19 NOTE — Progress Notes (Signed)
Patient ID: Casey Golden, male    DOB: Jul 29, 1950, 62 y.o.   MRN: [id number]  HPI Comments: Casey Golden is a very pleasant 62 year old gentleman returns today for evaluation and management of his coronary artery disease, history of smoking for 27 years,  mixed hyperlipidemia, hypertension. he does report a syncopal episode many years ago while walking his dog. He was down for one minute. He had no warning. He has not had any further episodes since that time   Casey Golden has been very active. He works at [employer]. He does report having mild chronic shortness of breath. Even at rest he feels slight tightness with breathing. He thinks that the breathing is "sapping his strength". He reports trying Symbicort in the past which improved his breathing. He is unable to stay on Symbicort secondary to price. He does use albuterol which helps his symptoms for a short period of time.  He reports recent LFT abnormality, Lipitor was held by Dr. Marguerite Olea.  EKG shows normal sinus rhythm with rate 62 beats per minute, no significant ST or T wave changes  He had a positive Myoview several years ago which resulted in a heart catheterization. He had nonobstructive disease. His normal left ventricular function. Prior lab work showing total cholesterol 111, LDL 58, HDL 30, triglycerides 117      Outpatient Encounter Prescriptions as of 09/19/2012  Medication Sig Dispense Refill  . albuterol (PROVENTIL HFA;VENTOLIN HFA) 108 (90 BASE) MCG/ACT inhaler Inhale 2 puffs into the lungs every 6 (six) hours as needed for wheezing.  1 Inhaler  2  . aspirin 81 MG EC tablet Take 81 mg by mouth daily.        Marland Kitchen atenolol (TENORMIN) 25 MG tablet Take 25 mg by mouth daily.        Marland Kitchen atorvastatin (LIPITOR) 40 MG tablet Take 20 mg by mouth daily.       . Cholecalciferol (VITAMIN D) 2000 UNITS tablet Take 5,000 Units by mouth daily.       . fenofibrate (TRICOR) 145 MG tablet Take 145 mg by mouth daily.        . metFORMIN (GLUCOPHAGE) 500 MG  tablet Take 500 mg by mouth 2 (two) times daily with a meal.        . omega-3 acid ethyl esters (LOVAZA) 1 G capsule Take 2 capsules by mouth 2 (two) times daily.        . [DISCONTINUED] azithromycin (ZITHROMAX) 250 MG tablet Please take two teh first day, then one a day  6 each  1  . [DISCONTINUED] diphenhydrAMINE (SOMINEX) 25 MG tablet Take 25 mg by mouth as needed.        Marland Kitchen lisinopril (PRINIVIL,ZESTRIL) 10 MG tablet Take 1 tablet (10 mg total) by mouth daily.  90 tablet  3  . [DISCONTINUED] Multiple Vitamins-Minerals (CENTRUM SILVER) tablet Take 1 tablet by mouth daily.         No facility-administered encounter medications on file as of 09/19/2012.     Review of Systems  Constitutional: Negative.   HENT: Negative.   Eyes: Negative.   Respiratory: Positive for shortness of breath.   Cardiovascular: Negative.   Gastrointestinal: Negative.   Musculoskeletal: Negative.   Skin: Negative.   Neurological: Negative.   Psychiatric/Behavioral: Negative.   All other systems reviewed and are negative.    BP 124/72  Pulse 62  Ht 5\' 11"  (1.803 m)  Wt 255 lb (115.667 kg)  BMI 35.58 kg/m2  Physical Exam  Nursing  note and vitals reviewed. Constitutional: He is oriented to person, place, and time. He appears well-developed and well-nourished.  HENT:  Head: Normocephalic.  Nose: Nose normal.  Mouth/Throat: Oropharynx is clear and moist.  Eyes: Conjunctivae are normal. Pupils are equal, round, and reactive to light.  Neck: Normal range of motion. Neck supple. No JVD present.  Cardiovascular: Normal rate, regular rhythm, S1 normal, S2 normal, normal heart sounds and intact distal pulses.  Exam reveals no gallop and no friction rub.   No murmur heard. Pulmonary/Chest: Effort normal and breath sounds normal. No respiratory distress. He has no wheezes. He has no rales. He exhibits no tenderness.  Coarse upper airway breath sounds bilaterally  Abdominal: Soft. Bowel sounds are normal. He  exhibits no distension. There is no tenderness.  Musculoskeletal: Normal range of motion. He exhibits no edema and no tenderness.  Lymphadenopathy:    He has no cervical adenopathy.  Neurological: He is alert and oriented to person, place, and time. Coordination normal.  Skin: Skin is warm and dry. No rash noted. No erythema.  Psychiatric: He has a normal mood and affect. His behavior is normal. Judgment and thought content normal.      Assessment and Plan

## 2012-09-19 NOTE — Assessment & Plan Note (Signed)
Blood pressure is well controlled on today's visit. No changes made to the medications. 

## 2012-09-19 NOTE — Patient Instructions (Addendum)
You are doing well. No medication changes were made.  Please call us if you have new issues that need to be addressed before your next appt.  Your physician wants you to follow-up in: 6 months.  You will receive a reminder letter in the mail two months in advance. If you don't receive a letter, please call our office to schedule the follow-up appointment.   

## 2012-09-26 ENCOUNTER — Encounter: Payer: Self-pay | Admitting: Cardiovascular Disease

## 2012-12-03 ENCOUNTER — Ambulatory Visit: Payer: Self-pay | Admitting: Podiatry

## 2012-12-04 ENCOUNTER — Other Ambulatory Visit: Payer: Self-pay

## 2013-01-01 DIAGNOSIS — Z85828 Personal history of other malignant neoplasm of skin: Secondary | ICD-10-CM | POA: Insufficient documentation

## 2013-01-29 DIAGNOSIS — K760 Fatty (change of) liver, not elsewhere classified: Secondary | ICD-10-CM

## 2013-01-29 HISTORY — PX: MOHS SURGERY: SUR867

## 2013-01-29 HISTORY — DX: Fatty (change of) liver, not elsewhere classified: K76.0

## 2013-03-12 ENCOUNTER — Ambulatory Visit: Payer: Self-pay

## 2013-03-12 ENCOUNTER — Ambulatory Visit: Payer: Self-pay | Admitting: Urgent Care

## 2013-03-12 LAB — RAPID INFLUENZA A&B ANTIGENS

## 2013-03-18 ENCOUNTER — Ambulatory Visit: Payer: Self-pay | Admitting: Physician Assistant

## 2013-05-11 ENCOUNTER — Encounter: Payer: Self-pay | Admitting: Cardiovascular Disease

## 2013-05-11 ENCOUNTER — Ambulatory Visit (INDEPENDENT_AMBULATORY_CARE_PROVIDER_SITE_OTHER): Payer: Managed Care, Other (non HMO) | Admitting: Cardiovascular Disease

## 2013-05-11 VITALS — BP 142/78 | HR 62 | Ht 71.0 in | Wt 254.8 lb

## 2013-05-11 DIAGNOSIS — R0602 Shortness of breath: Secondary | ICD-10-CM

## 2013-05-11 DIAGNOSIS — E785 Hyperlipidemia, unspecified: Secondary | ICD-10-CM

## 2013-05-11 DIAGNOSIS — I10 Essential (primary) hypertension: Secondary | ICD-10-CM

## 2013-05-11 DIAGNOSIS — I2510 Atherosclerotic heart disease of native coronary artery without angina pectoris: Secondary | ICD-10-CM

## 2013-05-11 MED ORDER — ATORVASTATIN CALCIUM 20 MG PO TABS: 20.0000 mg | ORAL_TABLET | Freq: Every day | ORAL | Status: DC

## 2013-05-11 NOTE — Assessment & Plan Note (Signed)
Cholesterol is at goal on the current lipid regimen. No changes to the medications were made.  

## 2013-05-11 NOTE — Assessment & Plan Note (Signed)
Chronic mild shortness of breath at baseline. Long-standing issue. Stable

## 2013-05-11 NOTE — Assessment & Plan Note (Signed)
Currently with no symptoms of angina. No further workup at this time. Continue current medication regimen. 

## 2013-05-11 NOTE — Progress Notes (Signed)
Patient ID: Casey Golden, male    DOB: Apr 08, 1950, 63 y.o.   MRN: [id number]  HPI Comments: Casey Golden is a very pleasant 63 year old gentleman returns today for evaluation and management of his coronary artery disease, history of smoking for 27 years,  mixed hyperlipidemia, hypertension. he does report a syncopal episode many years ago while walking his dog. He was down for one minute. He had no warning. He has not had any further episodes since that time   Casey. Golden has been very active. He works full-time at [employer] Plans on working several more years. On today's visit, reports having some sweating episodes at times, some reflux symptoms. Reports he was previously off his statin for elevated LFTs, now back on his Lipitor Was previously on Symbicort but this was expensive for him, now uses albuterol as needed Continues to have mild chronic shortness of breath  EKG shows normal sinus rhythm with rate 62 beats per minute, no significant ST or T wave changes  He had a positive Myoview several years ago which resulted in a heart catheterization. He had nonobstructive disease. His normal left ventricular function. Prior lab work showing total cholesterol 111, LDL 58, HDL 30, triglycerides 117 while on a statin      Outpatient Encounter Prescriptions as of 05/11/2013  Medication Sig  . albuterol (PROVENTIL HFA;VENTOLIN HFA) 108 (90 BASE) MCG/ACT inhaler Inhale 2 puffs into the lungs every 6 (six) hours as needed for wheezing.  Marland Kitchen aspirin 81 MG EC tablet Take 81 mg by mouth daily.    Marland Kitchen atenolol (TENORMIN) 25 MG tablet Take 25 mg by mouth daily.    Marland Kitchen atorvastatin (LIPITOR) 20 MG tablet Take 20 mg by mouth daily.  . cholecalciferol (VITAMIN D) 1000 UNITS tablet Take 1,500 Units by mouth daily.  . ergocalciferol (VITAMIN D2) 50000 UNITS capsule Take 50,000 Units by mouth once a week.  . fenofibrate (TRICOR) 145 MG tablet Take 145 mg by mouth daily.    Marland Kitchen lisinopril (PRINIVIL,ZESTRIL) 10 MG tablet Take 1  tablet (10 mg total) by mouth daily.  . metFORMIN (GLUCOPHAGE) 500 MG tablet Take 500 mg by mouth 2 (two) times daily with a meal.      Review of Systems  Constitutional: Negative.   HENT: Negative.   Eyes: Negative.   Respiratory: Positive for shortness of breath.   Cardiovascular: Negative.   Gastrointestinal: Negative.   Endocrine: Negative.   Musculoskeletal: Negative.   Skin: Negative.   Allergic/Immunologic: Negative.   Neurological: Negative.   Hematological: Negative.   Psychiatric/Behavioral: Negative.   All other systems reviewed and are negative.   BP 142/78  Pulse 62  Ht 5\' 11"  (1.803 m)  Wt 254 lb 12 oz (115.554 kg)  BMI 35.55 kg/m2  Physical Exam  Nursing note and vitals reviewed. Constitutional: He is oriented to person, place, and time. He appears well-developed and well-nourished.  HENT:  Head: Normocephalic.  Nose: Nose normal.  Mouth/Throat: Oropharynx is clear and moist.  Eyes: Conjunctivae are normal. Pupils are equal, round, and reactive to light.  Neck: Normal range of motion. Neck supple. No JVD present.  Cardiovascular: Normal rate, regular rhythm, S1 normal, S2 normal, normal heart sounds and intact distal pulses.  Exam reveals no gallop and no friction rub.   No murmur heard. Pulmonary/Chest: Effort normal and breath sounds normal. No respiratory distress. He has no wheezes. He has no rales. He exhibits no tenderness.  Abdominal: Soft. Bowel sounds are normal. He exhibits no distension.  There is no tenderness.  Musculoskeletal: Normal range of motion. He exhibits no edema and no tenderness.  Lymphadenopathy:    He has no cervical adenopathy.  Neurological: He is alert and oriented to person, place, and time. Coordination normal.  Skin: Skin is warm and dry. No rash noted. No erythema.  Psychiatric: He has a normal mood and affect. His behavior is normal. Judgment and thought content normal.      Assessment and Plan

## 2013-05-11 NOTE — Patient Instructions (Addendum)
You are doing well. No medication changes were made.  For heartburn, try pepcid/zantac with tums  Please call us if you have new issues that need to be addressed before your next appt.  Your physician wants you to follow-up in: 12 months.  You will receive a reminder letter in the mail two months in advance. If you don't receive a letter, please call our office to schedule the follow-up appointment.

## 2013-05-11 NOTE — Assessment & Plan Note (Signed)
Blood pressure is well controlled on today's visit. No changes made to the medications. 

## 2013-07-29 ENCOUNTER — Ambulatory Visit: Payer: Self-pay | Admitting: Otolaryngology

## 2013-09-16 ENCOUNTER — Ambulatory Visit: Payer: Self-pay | Admitting: Otolaryngology

## 2013-12-05 ENCOUNTER — Ambulatory Visit: Payer: Self-pay | Admitting: Family Medicine

## 2013-12-05 LAB — COMPREHENSIVE METABOLIC PANEL
Albumin: 3.7 g/dL (ref 3.4–5.0)
Alkaline Phosphatase: 60 U/L
Anion Gap: 7 (ref 7–16)
BUN: 19 mg/dL — ABNORMAL HIGH (ref 7–18)
Bilirubin,Total: 0.6 mg/dL (ref 0.2–1.0)
Calcium, Total: 9.2 mg/dL (ref 8.5–10.1)
Chloride: 101 mmol/L (ref 98–107)
Co2: 32 mmol/L (ref 21–32)
Creatinine: 1.29 mg/dL (ref 0.60–1.30)
EGFR (African American): >60
EGFR (Non-African Amer.): 59 — ABNORMAL LOW
Glucose: 157 mg/dL — ABNORMAL HIGH (ref 65–99)
Osmolality: 285 (ref 275–301)
Potassium: 4.3 mmol/L (ref 3.5–5.1)
SGOT(AST): 35 U/L (ref 15–37)
SGPT (ALT): 55 U/L
Sodium: 140 mmol/L (ref 136–145)
Total Protein: 7.3 g/dL (ref 6.4–8.2)

## 2013-12-05 LAB — CBC WITH DIFFERENTIAL/PLATELET
Basophil #: 0.1 10*3/uL (ref 0.0–0.1)
Basophil %: 0.8 %
Eosinophil #: 0.3 10*3/uL (ref 0.0–0.7)
Eosinophil %: 2.7 %
HCT: 46 % (ref 40.0–52.0)
HGB: 15.6 g/dL (ref 13.0–18.0)
Lymphocyte #: 1.6 10*3/uL (ref 1.0–3.6)
Lymphocyte %: 16.8 %
MCH: 32.9 pg (ref 26.0–34.0)
MCHC: 33.9 g/dL (ref 32.0–36.0)
MCV: 97 fL (ref 80–100)
Monocyte #: 0.9 x10 3/mm (ref 0.2–1.0)
Monocyte %: 9.1 %
Neutrophil #: 6.7 10*3/uL — ABNORMAL HIGH (ref 1.4–6.5)
Neutrophil %: 70.6 %
Platelet: 243 10*3/uL (ref 150–440)
RBC: 4.74 10*6/uL (ref 4.40–5.90)
RDW: 12 % (ref 11.5–14.5)
WBC: 9.6 10*3/uL (ref 3.8–10.6)

## 2013-12-05 LAB — URINALYSIS, COMPLETE
Bacteria: NEGATIVE
Bilirubin,UR: NEGATIVE
Blood: NEGATIVE
Glucose,UR: 250
Ketone: NEGATIVE
Leukocyte Esterase: NEGATIVE
Nitrite: NEGATIVE
Ph: 6 (ref 5.0–8.0)
Protein: NEGATIVE
Specific Gravity: 1.02 (ref 1.000–1.030)
WBC UR: NONE SEEN /HPF (ref 0–5)

## 2013-12-05 LAB — SEDIMENTATION RATE: Erythrocyte Sed Rate: 6 mm/hr (ref 0–20)

## 2013-12-07 LAB — URINE CULTURE

## 2014-04-08 ENCOUNTER — Other Ambulatory Visit: Payer: Self-pay | Admitting: Cardiovascular Disease

## 2014-05-06 DIAGNOSIS — E118 Type 2 diabetes mellitus with unspecified complications: Secondary | ICD-10-CM | POA: Insufficient documentation

## 2014-05-06 DIAGNOSIS — E785 Hyperlipidemia, unspecified: Secondary | ICD-10-CM

## 2014-05-06 DIAGNOSIS — I2510 Atherosclerotic heart disease of native coronary artery without angina pectoris: Secondary | ICD-10-CM | POA: Insufficient documentation

## 2014-05-06 DIAGNOSIS — Z1331 Encounter for screening for depression: Secondary | ICD-10-CM | POA: Insufficient documentation

## 2014-05-06 DIAGNOSIS — Z9181 History of falling: Secondary | ICD-10-CM | POA: Insufficient documentation

## 2014-05-06 DIAGNOSIS — E875 Hyperkalemia: Secondary | ICD-10-CM | POA: Insufficient documentation

## 2014-05-06 DIAGNOSIS — N5089 Other specified disorders of the male genital organs: Secondary | ICD-10-CM | POA: Insufficient documentation

## 2014-05-06 DIAGNOSIS — E559 Vitamin D deficiency, unspecified: Secondary | ICD-10-CM | POA: Insufficient documentation

## 2014-05-06 DIAGNOSIS — M79603 Pain in arm, unspecified: Secondary | ICD-10-CM | POA: Insufficient documentation

## 2014-05-06 DIAGNOSIS — G4733 Obstructive sleep apnea (adult) (pediatric): Secondary | ICD-10-CM | POA: Insufficient documentation

## 2014-05-06 DIAGNOSIS — I10 Essential (primary) hypertension: Secondary | ICD-10-CM | POA: Insufficient documentation

## 2014-05-06 DIAGNOSIS — E1169 Type 2 diabetes mellitus with other specified complication: Secondary | ICD-10-CM | POA: Insufficient documentation

## 2014-05-06 DIAGNOSIS — Z23 Encounter for immunization: Secondary | ICD-10-CM | POA: Insufficient documentation

## 2014-05-12 ENCOUNTER — Ambulatory Visit (INDEPENDENT_AMBULATORY_CARE_PROVIDER_SITE_OTHER): Payer: Managed Care, Other (non HMO) | Admitting: Cardiovascular Disease

## 2014-05-12 ENCOUNTER — Encounter: Payer: Self-pay | Admitting: Cardiovascular Disease

## 2014-05-12 VITALS — BP 140/80 | HR 68 | Ht 72.0 in | Wt 255.2 lb

## 2014-05-12 DIAGNOSIS — E785 Hyperlipidemia, unspecified: Secondary | ICD-10-CM | POA: Diagnosis not present

## 2014-05-12 DIAGNOSIS — I10 Essential (primary) hypertension: Secondary | ICD-10-CM | POA: Diagnosis not present

## 2014-05-12 DIAGNOSIS — R0602 Shortness of breath: Secondary | ICD-10-CM

## 2014-05-12 DIAGNOSIS — I25119 Atherosclerotic heart disease of native coronary artery with unspecified angina pectoris: Secondary | ICD-10-CM

## 2014-05-12 MED ORDER — ALBUTEROL SULFATE HFA 108 (90 BASE) MCG/ACT IN AERS: 2.0000 | INHALATION_SPRAY | Freq: Four times a day (QID) | RESPIRATORY_TRACT | Status: DC | PRN

## 2014-05-12 NOTE — Assessment & Plan Note (Signed)
Currently with no symptoms of angina. No further workup at this time. Continue current medication regimen. 

## 2014-05-12 NOTE — Assessment & Plan Note (Signed)
Currently not taking his statin per primary care. We will try to obtain a copy of his LFTs

## 2014-05-12 NOTE — Assessment & Plan Note (Signed)
Blood pressure is well controlled on today's visit. No changes made to the medications. 

## 2014-05-12 NOTE — Progress Notes (Signed)
Patient ID: Calil Amor, male    DOB: 1950/06/04, 64 y.o.   MRN: [id number]  HPI Comments: Mr Bourbon is a very pleasant 64 year old gentleman returns today for evaluation and management of his coronary artery disease, history of smoking for 27 years,  mixed hyperlipidemia, hypertension. he does report a syncopal episode many years ago while walking his dog. He was down for one minute. He had no warning. He has not had any further episodes since that time  Mr. Brisco continues to work  full-time at [employer] Plans on working several more years. Rare episodes of shortness of breath. Does not take any inhalers on a regular basis. Reports having elevated liver function test, statin held one month ago by primary care. No regular exercise program but does do significant activity through his work  EKG shows normal sinus rhythm with rate 68 beats per minute, no significant ST or T wave changes  Other past medical history Previously had some sweating episodes at times Was previously on Symbicort but this was expensive for him, now uses albuterol as needed  He had a positive Myoview several years ago which resulted in a heart catheterization. He had nonobstructive disease. His normal left ventricular function. Prior lab work showing total cholesterol 111, LDL 58, HDL 30, triglycerides 117 while on a statin     Allergies  Allergen Reactions  . Sulfonamide Derivatives Hives  . Sulfa Antibiotics     Outpatient Encounter Prescriptions as of 05/12/2014  Medication Sig  . albuterol (PROVENTIL HFA;VENTOLIN HFA) 108 (90 BASE) MCG/ACT inhaler Inhale 2 puffs into the lungs every 6 (six) hours as needed for wheezing.  Marland Kitchen amLODipine (NORVASC) 5 MG tablet Take by mouth.  Marland Kitchen aspirin 81 MG EC tablet Take 81 mg by mouth daily.    Marland Kitchen atenolol (TENORMIN) 25 MG tablet Take 25 mg by mouth daily.    . budesonide-formoterol (SYMBICORT) 160-4.5 MCG/ACT inhaler Inhale into the lungs.  . cholecalciferol (VITAMIN D) 1000  UNITS tablet Take 5,000 Units by mouth daily.   . Cholecalciferol 5000 UNITS capsule Take 5,000 Units by mouth 2 (two) times daily.   . ergocalciferol (VITAMIN D2) 50000 UNITS capsule Take 50,000 Units by mouth once a week.  Marland Kitchen lisinopril (PRINIVIL,ZESTRIL) 10 MG tablet Take 1 tablet (10 mg total) by mouth daily.  . metFORMIN (GLUCOPHAGE) 500 MG tablet Take 500 mg by mouth 2 (two) times daily with a meal.    . [DISCONTINUED] albuterol (PROVENTIL HFA;VENTOLIN HFA) 108 (90 BASE) MCG/ACT inhaler Inhale 2 puffs into the lungs every 6 (six) hours as needed for wheezing. (Patient not taking: Reported on 05/12/2014)  . [DISCONTINUED] aspirin 81 MG EC tablet Take by mouth.  . [DISCONTINUED] atenolol (TENORMIN) 25 MG tablet Take by mouth.  . [DISCONTINUED] atorvastatin (LIPITOR) 20 MG tablet TAKE 1 TABLET (20 MG TOTAL) BY MOUTH DAILY. (Patient not taking: Reported on 05/12/2014)  . [DISCONTINUED] fenofibrate (TRICOR) 145 MG tablet Take 145 mg by mouth daily.    . [DISCONTINUED] fenofibrate (TRICOR) 145 MG tablet Take by mouth.  . [DISCONTINUED] metFORMIN (GLUCOPHAGE) 500 MG tablet Take by mouth.    Past Medical History  Diagnosis Date  . Hyperlipidemia   . Hypertension     Past Surgical History  Procedure Laterality Date  . Appendectomy    . Cardiac catheterization      x2  . Cholecystectomy    . Tonsillectomy      Social History  reports that he quit smoking about 31 years  ago. He does not have any smokeless tobacco history on file. He reports that he does not drink alcohol or use illicit drugs.  Family History family history includes Heart attack in his mother; Heart disease in his mother. There is no history of Coronary artery disease.   Review of Systems  Constitutional: Negative.   Respiratory: Positive for shortness of breath.   Cardiovascular: Negative.   Gastrointestinal: Negative.   Musculoskeletal: Negative.   Skin: Negative.   Neurological: Negative.   Hematological:  Negative.   Psychiatric/Behavioral: Negative.   All other systems reviewed and are negative.   BP 140/80 mmHg  Pulse 68  Ht 6' (1.829 m)  Wt 255 lb 4 oz (115.781 kg)  BMI 34.61 kg/m2  Physical Exam  Constitutional: He is oriented to person, place, and time. He appears well-developed and well-nourished.  HENT:  Head: Normocephalic.  Nose: Nose normal.  Mouth/Throat: Oropharynx is clear and moist.  Eyes: Conjunctivae are normal. Pupils are equal, round, and reactive to light.  Neck: Normal range of motion. Neck supple. No JVD present.  Cardiovascular: Normal rate, regular rhythm, S1 normal, S2 normal, normal heart sounds and intact distal pulses.  Exam reveals no gallop and no friction rub.   No murmur heard. Pulmonary/Chest: Effort normal and breath sounds normal. No respiratory distress. He has no wheezes. He has no rales. He exhibits no tenderness.  Abdominal: Soft. Bowel sounds are normal. He exhibits no distension. There is no tenderness.  Musculoskeletal: Normal range of motion. He exhibits no edema or tenderness.  Lymphadenopathy:    He has no cervical adenopathy.  Neurological: He is alert and oriented to person, place, and time. Coordination normal.  Skin: Skin is warm and dry. No rash noted. No erythema.  Psychiatric: He has a normal mood and affect. His behavior is normal. Judgment and thought content normal.      Assessment and Plan   Nursing note and vitals reviewed.

## 2014-05-12 NOTE — Assessment & Plan Note (Signed)
Prior smoking history. Unable to afford Symbicort. Recommended he use albuterol as needed

## 2014-05-12 NOTE — Patient Instructions (Addendum)
You are doing well. No medication changes were made.  We will request you lab work  Please call us if you have new issues that need to be addressed before your next appt.  Your physician wants you to follow-up in: 12 months.  You will receive a reminder letter in the mail two months in advance. If you don't receive a letter, please call our office to schedule the follow-up appointment.

## 2014-07-21 ENCOUNTER — Ambulatory Visit (INDEPENDENT_AMBULATORY_CARE_PROVIDER_SITE_OTHER): Payer: Managed Care, Other (non HMO) | Admitting: Family Medicine

## 2014-07-21 ENCOUNTER — Encounter: Payer: Self-pay | Admitting: Family Medicine

## 2014-07-21 VITALS — BP 130/68 | HR 74 | Temp 97.6°F | Ht 72.0 in | Wt 256.4 lb

## 2014-07-21 DIAGNOSIS — R748 Abnormal levels of other serum enzymes: Secondary | ICD-10-CM | POA: Diagnosis not present

## 2014-07-21 DIAGNOSIS — E119 Type 2 diabetes mellitus without complications: Secondary | ICD-10-CM | POA: Diagnosis not present

## 2014-07-21 DIAGNOSIS — I10 Essential (primary) hypertension: Secondary | ICD-10-CM | POA: Diagnosis not present

## 2014-07-21 DIAGNOSIS — E1169 Type 2 diabetes mellitus with other specified complication: Secondary | ICD-10-CM

## 2014-07-21 DIAGNOSIS — E785 Hyperlipidemia, unspecified: Secondary | ICD-10-CM

## 2014-07-21 DIAGNOSIS — E669 Obesity, unspecified: Secondary | ICD-10-CM

## 2014-07-21 DIAGNOSIS — I2510 Atherosclerotic heart disease of native coronary artery without angina pectoris: Secondary | ICD-10-CM

## 2014-07-21 DIAGNOSIS — E559 Vitamin D deficiency, unspecified: Secondary | ICD-10-CM

## 2014-07-21 NOTE — Progress Notes (Signed)
Name: Casey Golden   MRN: [id number]    DOB: 03/30/1950   Date:07/21/2014       Progress Note  Subjective  Chief Complaint  Chief Complaint  Patient presents with  . Establish Care    was Dr Jacqualine Code patient    Diabetes He presents for his follow-up diabetic visit. He has type 2 diabetes mellitus. There are no hypoglycemic associated symptoms. Pertinent negatives for hypoglycemia include no headaches. Pertinent negatives for diabetes include no chest pain, no fatigue, no foot paresthesias, no polydipsia and no polyuria. Symptoms are stable. Diabetic complications include heart disease. Pertinent negatives for diabetic complications include no autonomic neuropathy or CVA. Risk factors for coronary artery disease include diabetes mellitus, family history, male sex and obesity. Current diabetic treatment includes oral agent (monotherapy). He is compliant with treatment all of the time. His weight is stable. He is following a diabetic and generally healthy diet. He has not had a previous visit with a dietitian. He rarely participates in exercise. (Blood glucose at home is not being checked.) An ACE inhibitor/angiotensin II receptor blocker is being taken.  Hypertension This is a chronic problem. The problem is controlled. Pertinent negatives include no chest pain, headaches, palpitations, peripheral edema or shortness of breath. Past treatments include ACE inhibitors, calcium channel blockers and beta blockers. There are no compliance problems.  Hypertensive end-organ damage includes CAD/MI. There is no history of angina, kidney disease or CVA.  Hyperlipidemia This is a chronic problem. The problem is controlled. Recent lipid tests were reviewed and are high. Exacerbating diseases include diabetes and obesity. Pertinent negatives include no chest pain, myalgias or shortness of breath. He is currently on no antihyperlipidemic treatment (Patient was on a atorvastatin and fenofibrate, both of which were  stopped presumably due to elevated liver enzymes.).      Past Medical History  Diagnosis Date  . Hyperlipidemia   . Hypertension   . Diabetes mellitus without complication   . CAD (coronary artery disease)     Followed by Dr. Rockey Situ  . Chronic kidney disease     Past Surgical History  Procedure Laterality Date  . Cardiac catheterization      x2  . Cholecystectomy    . Tonsillectomy    . Appendectomy    . Spine surgery      Spinal fusion.  Marland Kitchen Hernia repair      umbilical hernia.    Family History  Problem Relation Age of Onset  . Coronary artery disease Neg Hx     Premature  . Heart attack Mother   . Heart disease Mother   . Diabetes Mother   . Cancer Father   . Heart disease Paternal Grandmother   . Heart disease Paternal Grandfather     History   Social History  . Marital Status: Married    Spouse Name: N/A  . Number of Children: N/A  . Years of Education: N/A   Occupational History  . Full time [employer]   Social History Main Topics  . Smoking status: Former Smoker    Quit date: 01/29/1990  . Smokeless tobacco: Not on file  . Alcohol Use: No  . Drug Use: No  . Sexual Activity: Not on file   Other Topics Concern  . Not on file   Social History Narrative   Married     Current outpatient prescriptions:  .  albuterol (PROVENTIL HFA;VENTOLIN HFA) 108 (90 BASE) MCG/ACT inhaler, Inhale 2 puffs into the lungs every 6 (six)  hours as needed for wheezing., Disp: 1 Inhaler, Rfl: 2 .  amLODipine (NORVASC) 5 MG tablet, Take by mouth., Disp: , Rfl:  .  aspirin 81 MG EC tablet, Take 81 mg by mouth daily.  , Disp: , Rfl:  .  atenolol (TENORMIN) 25 MG tablet, Take 25 mg by mouth daily.  , Disp: , Rfl:  .  budesonide-formoterol (SYMBICORT) 160-4.5 MCG/ACT inhaler, Inhale into the lungs., Disp: , Rfl:  .  ergocalciferol (VITAMIN D2) 50000 UNITS capsule, Take 50,000 Units by mouth once a week., Disp: , Rfl:  .  lisinopril (PRINIVIL,ZESTRIL) 10 MG  tablet, Take 1 tablet (10 mg total) by mouth daily., Disp: 90 tablet, Rfl: 3 .  metFORMIN (GLUCOPHAGE) 500 MG tablet, Take 500 mg by mouth 2 (two) times daily with a meal.  , Disp: , Rfl:   Allergies  Allergen Reactions  . Sulfonamide Derivatives Hives  . Sulfa Antibiotics      Review of Systems  Constitutional: Negative for fatigue.  Respiratory: Negative for shortness of breath.   Cardiovascular: Negative for chest pain, palpitations and leg swelling.  Musculoskeletal: Negative for myalgias.  Neurological: Negative for headaches.  Endo/Heme/Allergies: Negative for polydipsia.      Objective  Filed Vitals:   07/21/14 0817  BP: 130/68  Pulse: 74  Temp: 97.6 F (36.4 C)  TempSrc: Oral  Height: 6' (1.829 m)  Weight: 256 lb 6.4 oz (116.302 kg)  SpO2: 96%    Physical Exam  Constitutional: He is oriented to person, place, and time and well-developed, well-nourished, and in no distress.  HENT:  Head: Normocephalic and atraumatic.  Eyes: Conjunctivae are normal. Pupils are equal, round, and reactive to light.  Neck: Neck supple.  Cardiovascular: Normal rate and regular rhythm.   Pulmonary/Chest: Effort normal and breath sounds normal.  Abdominal: Soft. Bowel sounds are normal.  Musculoskeletal:       Right ankle: He exhibits swelling.       Left ankle: He exhibits swelling.  Neurological: He is alert and oriented to person, place, and time.  Nursing note and vitals reviewed.      Assessment & Plan 1. Essential hypertension Blood pressure at goal. Continue present management.  2. Coronary artery disease involving native coronary artery of native heart without angina pectoris Patient being followed by cardiology. Continue baby aspirin daily.  3. Diabetes mellitus type 2 in obese We will repeat A1c today and follow-up. - HgB A1c  4. Dyslipidemia associated with type 2 diabetes mellitus Patient is no longer on fenofibrate. Repeat fasting lipid panel today and  follow-up - Lipid Profile  5. Vitamin D deficiency Repeat vitamin D levels today. - Vitamin D (25 hydroxy)  6. Elevated liver enzymes Patient is being followed by gastroenterology. Elevated liver enzymes most likely from fatty liver disease. Repeat levels today. - Comprehensive metabolic panel   Kue Fox Asad A. [facility] 07/21/2014 9:07 AM

## 2014-07-22 LAB — COMPREHENSIVE METABOLIC PANEL
ALT: 82 IU/L — ABNORMAL HIGH (ref 0–44)
AST: 66 IU/L — ABNORMAL HIGH (ref 0–40)
Albumin/Globulin Ratio: 1.6 (ref 1.1–2.5)
Albumin: 4.4 g/dL (ref 3.6–4.8)
Alkaline Phosphatase: 74 IU/L (ref 39–117)
BUN/Creatinine Ratio: 14 (ref 10–22)
BUN: 13 mg/dL (ref 8–27)
Bilirubin Total: 0.8 mg/dL (ref 0.0–1.2)
CO2: 25 mmol/L (ref 18–29)
Calcium: 9.7 mg/dL (ref 8.6–10.2)
Chloride: 96 mmol/L — ABNORMAL LOW (ref 97–108)
Creatinine, Ser: 0.94 mg/dL (ref 0.76–1.27)
GFR calc Af Amer: 97 mL/min/{1.73_m2} (ref >59)
GFR calc non Af Amer: 84 mL/min/{1.73_m2} (ref >59)
Globulin, Total: 2.8 g/dL (ref 1.5–4.5)
Glucose: 133 mg/dL — ABNORMAL HIGH (ref 65–99)
Potassium: 5.1 mmol/L (ref 3.5–5.2)
Sodium: 139 mmol/L (ref 134–144)
Total Protein: 7.2 g/dL (ref 6.0–8.5)

## 2014-07-22 LAB — LIPID PANEL
Chol/HDL Ratio: 5.8 ratio units — ABNORMAL HIGH (ref 0.0–5.0)
Cholesterol, Total: 179 mg/dL (ref 100–199)
HDL: 31 mg/dL — ABNORMAL LOW (ref >39)
LDL Calculated: 74 mg/dL (ref 0–99)
Triglycerides: 370 mg/dL — ABNORMAL HIGH (ref 0–149)
VLDL Cholesterol Cal: 74 mg/dL — ABNORMAL HIGH (ref 5–40)

## 2014-07-22 LAB — HEMOGLOBIN A1C
Est. average glucose Bld gHb Est-mCnc: 151 mg/dL
Hgb A1c MFr Bld: 6.9 % — ABNORMAL HIGH (ref 4.8–5.6)

## 2014-07-22 LAB — VITAMIN D 25 HYDROXY (VIT D DEFICIENCY, FRACTURES): Vit D, 25-Hydroxy: 35.5 ng/mL (ref 30.0–100.0)

## 2014-07-23 ENCOUNTER — Telehealth: Payer: Self-pay

## 2014-07-23 NOTE — Telephone Encounter (Signed)
-----   Message from Roselee Nova, MD sent at 07/22/2014  6:06 PM EDT ----- Fasting lipid panel shows elevated triglycerides at 370, below normal HDL at 31, and a normal total cholesterol and LDL cholesterol at 179 and 74 respectively. Patient was initially on fenofibrate and atorvastatin, both of which were stopped by previous PCP due to elevated liver enzymes. We can start him on Lovaza or Vascepa for hypertriglyceridemia. Please schedule patient for an appointment. A1c 6.9%, considered at goal for diabetes mellitus. It is improved from 7.0% from 02/23/2014. He should continue on present metformin dosage and repeat A1c in 4 months. CMP shows elevated AST at 66 and ALT at 82. These are worse from 04/05/2014 when they were 36 and 54 respectively. Patient should be referred to gastroenterology for further management of elevated liver enzymes. Vitamin D level is within normal limits at 35.5.

## 2014-07-23 NOTE — Telephone Encounter (Signed)
Spoke with pt. Pt. Advised of all results and will call back to make an appointment with Dr. Manuella Ghazi (driving at time of phone call) Verbalized understanding of lab work. Thanks

## 2014-08-11 ENCOUNTER — Encounter: Payer: Self-pay | Admitting: Family Medicine

## 2014-08-11 ENCOUNTER — Ambulatory Visit (INDEPENDENT_AMBULATORY_CARE_PROVIDER_SITE_OTHER): Payer: Managed Care, Other (non HMO) | Admitting: Family Medicine

## 2014-08-11 VITALS — BP 130/70 | HR 69 | Temp 97.7°F | Resp 18 | Ht 71.0 in | Wt 254.9 lb

## 2014-08-11 DIAGNOSIS — E781 Pure hyperglyceridemia: Secondary | ICD-10-CM

## 2014-08-11 DIAGNOSIS — R748 Abnormal levels of other serum enzymes: Secondary | ICD-10-CM

## 2014-08-11 MED ORDER — OMEGA-3-ACID ETHYL ESTERS 1 G PO CAPS: 2.0000 g | ORAL_CAPSULE | Freq: Two times a day (BID) | ORAL | Status: DC

## 2014-08-11 NOTE — Progress Notes (Signed)
Name: Casey Golden   MRN: [id number]    DOB: 08-Jan-1951   Date:08/11/2014       Progress Note  Subjective  Chief Complaint  Chief Complaint  Patient presents with  . Follow-up    Cholesterol  . Hyperlipidemia    Hyperlipidemia This is a chronic problem. Recent lipid tests were reviewed and are high (Elevated TG, low HDL). Exacerbating diseases include diabetes, liver disease and obesity. Pertinent negatives include no chest pain, myalgias or shortness of breath.  Pt. has been on a atorvastatin and fenofibrate in the past, both of which were stopped by previous PCP Dr. Jacqualine Code because of elevated liver enzymes. He has recently restarted taking atorvastatin after it was refilled by his cardiologist Dr. Candis Musa.   Past Medical History  Diagnosis Date  . Hyperlipidemia   . Hypertension   . Diabetes mellitus without complication   . CAD (coronary artery disease)     Followed by Dr. Rockey Situ  . Chronic kidney disease     Past Surgical History  Procedure Laterality Date  . Cardiac catheterization      x2  . Cholecystectomy    . Tonsillectomy    . Appendectomy    . Spine surgery      Spinal fusion.  Marland Kitchen Hernia repair      umbilical hernia.    Family History  Problem Relation Age of Onset  . Coronary artery disease Neg Hx     Premature  . Heart attack Mother   . Heart disease Mother   . Diabetes Mother   . Cancer Father   . Heart disease Paternal Grandmother   . Heart disease Paternal Grandfather     History   Social History  . Marital Status: Married    Spouse Name: N/A  . Number of Children: N/A  . Years of Education: N/A   Occupational History  . Full time [employer]   Social History Main Topics  . Smoking status: Former Smoker    Quit date: 01/29/1990  . Smokeless tobacco: Not on file  . Alcohol Use: No  . Drug Use: No  . Sexual Activity: Not on file   Other Topics Concern  . Not on file   Social History Narrative   Married     Current  outpatient prescriptions:  .  albuterol (PROVENTIL HFA;VENTOLIN HFA) 108 (90 BASE) MCG/ACT inhaler, Inhale 2 puffs into the lungs every 6 (six) hours as needed for wheezing., Disp: 1 Inhaler, Rfl: 2 .  amLODipine (NORVASC) 5 MG tablet, Take by mouth., Disp: , Rfl:  .  aspirin 81 MG EC tablet, Take 81 mg by mouth daily.  , Disp: , Rfl:  .  atenolol (TENORMIN) 25 MG tablet, Take 25 mg by mouth daily.  , Disp: , Rfl:  .  atorvastatin (LIPITOR) 20 MG tablet, , Disp: , Rfl:  .  budesonide-formoterol (SYMBICORT) 160-4.5 MCG/ACT inhaler, Inhale into the lungs., Disp: , Rfl:  .  ergocalciferol (VITAMIN D2) 50000 UNITS capsule, Take 50,000 Units by mouth once a week., Disp: , Rfl:  .  lisinopril (PRINIVIL,ZESTRIL) 10 MG tablet, Take 1 tablet (10 mg total) by mouth daily., Disp: 90 tablet, Rfl: 3 .  metFORMIN (GLUCOPHAGE) 500 MG tablet, Take 500 mg by mouth 2 (two) times daily with a meal.  , Disp: , Rfl:  .  omega-3 acid ethyl esters (LOVAZA) 1 G capsule, Take 2 capsules (2 g total) by mouth 2 (two) times daily., Disp: 360 capsule, Rfl: 0  Allergies  Allergen Reactions  . Sulfonamide Derivatives Hives  . Sulfa Antibiotics      Review of Systems  Constitutional: Negative for fever and chills.  Respiratory: Negative for shortness of breath.   Cardiovascular: Negative for chest pain.  Musculoskeletal: Negative for myalgias.      Objective  Filed Vitals:   08/11/14 1210  BP: 130/70  Pulse: 69  Temp: 97.7 F (36.5 C)  TempSrc: Oral  Resp: 18  Height: 5\' 11"  (1.803 m)  Weight: 254 lb 14.4 oz (115.622 kg)  SpO2: 95%    Physical Exam  Constitutional: He is oriented to person, place, and time and well-developed, well-nourished, and in no distress.  Cardiovascular: Normal rate and regular rhythm.   Pulmonary/Chest: Effort normal and breath sounds normal.  Abdominal: Soft. Bowel sounds are normal.  Neurological: He is alert and oriented to person, place, and time.  Skin: Skin is warm and  dry.  Nursing note and vitals reviewed.      Recent Results (from the past 2160 hour(s))  Lipid Profile     Status: Abnormal   Collection Time: 07/21/14  9:35 AM  Result Value Ref Range   Cholesterol, Total 179 100 - 199 mg/dL   Triglycerides 370 (H) 0 - 149 mg/dL   HDL 31 (L) >39 mg/dL    Comment: According to ATP-III Guidelines, HDL-C >59 mg/dL is considered a negative risk factor for CHD.    VLDL Cholesterol Cal 74 (H) 5 - 40 mg/dL   LDL Calculated 74 0 - 99 mg/dL   Chol/HDL Ratio 5.8 (H) 0.0 - 5.0 ratio units    Comment:                                   T. Chol/HDL Ratio                                             Men  Women                               1/2 Avg.Risk  3.4    3.3                                   Avg.Risk  5.0    4.4                                2X Avg.Risk  9.6    7.1                                3X Avg.Risk 23.4   11.0   HgB A1c     Status: Abnormal   Collection Time: 07/21/14  9:35 AM  Result Value Ref Range   Hgb A1c MFr Bld 6.9 (H) 4.8 - 5.6 %    Comment:          Pre-diabetes: 5.7 - 6.4          Diabetes: >6.4          Glycemic control for adults with diabetes: <7.0  Est. average glucose Bld gHb Est-mCnc 151 mg/dL  Comprehensive metabolic panel     Status: Abnormal   Collection Time: 07/21/14  9:35 AM  Result Value Ref Range   Glucose 133 (H) 65 - 99 mg/dL   BUN 13 8 - 27 mg/dL   Creatinine, Ser 0.94 0.76 - 1.27 mg/dL   GFR calc non Af Amer 84 >59 mL/min/1.73   GFR calc Af Amer 97 >59 mL/min/1.73   BUN/Creatinine Ratio 14 10 - 22   Sodium 139 134 - 144 mmol/L   Potassium 5.1 3.5 - 5.2 mmol/L   Chloride 96 (L) 97 - 108 mmol/L   CO2 25 18 - 29 mmol/L   Calcium 9.7 8.6 - 10.2 mg/dL   Total Protein 7.2 6.0 - 8.5 g/dL   Albumin 4.4 3.6 - 4.8 g/dL   Globulin, Total 2.8 1.5 - 4.5 g/dL   Albumin/Globulin Ratio 1.6 1.1 - 2.5   Bilirubin Total 0.8 0.0 - 1.2 mg/dL   Alkaline Phosphatase 74 39 - 117 IU/L   AST 66 (H) 0 - 40 IU/L   ALT 82  (H) 0 - 44 IU/L  Vitamin D (25 hydroxy)     Status: None   Collection Time: 07/21/14  9:35 AM  Result Value Ref Range   Vit D, 25-Hydroxy 35.5 30.0 - 100.0 ng/mL    Comment: Vitamin D deficiency has been defined by the Institute of Medicine and an Endocrine Society practice guideline as a level of serum 25-OH vitamin D less than 20 ng/mL (1,2). The Endocrine Society went on to further define vitamin D insufficiency as a level between 21 and 29 ng/mL (2). 1. IOM (Institute of Medicine). 2010. Dietary reference    intakes for calcium and D. Mayer: The    [employer]. 2. Holick MF, Binkley Bostic, Bischoff-Ferrari HA, et al.    Evaluation, treatment, and prevention of vitamin D    deficiency: an Endocrine Society clinical practice    guideline. JCEM. 2011 Jul; 96(7):1911-30.      Assessment & Plan 1. Elevated liver enzymes Recheck liver enzymes and patient will return for complete workup if they're persistently elevated. Have explained the guidelines regarding statin therapy in the setting of elevated liver enzymes statins should be stopped if liver enzymes are more than 3 times the ULN. - Comprehensive metabolic panel  2. Hypertriglyceridemia Start patient on Lovaza to replace fenofibrate which was stopped by previous PCP. Recheck triglyceride levels in 6-8 weeks. - omega-3 acid ethyl esters (LOVAZA) 1 G capsule; Take 2 capsules (2 g total) by mouth 2 (two) times daily.  Dispense: 360 capsule; Refill: 0   Mickenzie Stolar Asad A. [facility] 08/11/2014 7:50 PM

## 2014-08-18 ENCOUNTER — Telehealth: Payer: Self-pay | Admitting: Cardiovascular Disease

## 2014-08-18 ENCOUNTER — Ambulatory Visit: Payer: Managed Care, Other (non HMO) | Admitting: Family Medicine

## 2014-08-18 NOTE — Telephone Encounter (Signed)
He is acceptable risk for non-cardiac surgery.

## 2014-08-18 NOTE — Telephone Encounter (Signed)
Patient needs cardiac clearance for Urological Surgery ( Dr. Junious Silk at [facility] in [place]  [phone]) that they want to do next week  and also wants new RX for Vitamin D.  Wants to be seen to talk about other medication changes as well.     Please call patient to discuss and advise.

## 2014-08-18 NOTE — Telephone Encounter (Signed)
Forward to [employer], PA-C for clearance

## 2014-08-19 NOTE — Telephone Encounter (Signed)
Clearance letter faxed to [phone]

## 2014-08-20 ENCOUNTER — Other Ambulatory Visit: Payer: Self-pay | Admitting: Urology

## 2014-08-20 ENCOUNTER — Other Ambulatory Visit: Payer: Self-pay | Admitting: Family Medicine

## 2014-08-20 MED ORDER — AMLODIPINE BESYLATE 5 MG PO TABS: 5.0000 mg | ORAL_TABLET | Freq: Every day | ORAL | Status: DC

## 2014-08-20 NOTE — Telephone Encounter (Signed)
Medication has been refilled and sent to [place].

## 2014-09-01 ENCOUNTER — Ambulatory Visit (INDEPENDENT_AMBULATORY_CARE_PROVIDER_SITE_OTHER): Payer: Managed Care, Other (non HMO) | Admitting: Cardiovascular Disease

## 2014-09-01 ENCOUNTER — Encounter: Payer: Self-pay | Admitting: Cardiovascular Disease

## 2014-09-01 VITALS — BP 130/70 | HR 73 | Ht 69.0 in | Wt 252.8 lb

## 2014-09-01 DIAGNOSIS — E785 Hyperlipidemia, unspecified: Secondary | ICD-10-CM

## 2014-09-01 DIAGNOSIS — I10 Essential (primary) hypertension: Secondary | ICD-10-CM | POA: Diagnosis not present

## 2014-09-01 DIAGNOSIS — R945 Abnormal results of liver function studies: Secondary | ICD-10-CM

## 2014-09-01 DIAGNOSIS — R7989 Other specified abnormal findings of blood chemistry: Secondary | ICD-10-CM

## 2014-09-01 DIAGNOSIS — Z01818 Encounter for other preprocedural examination: Secondary | ICD-10-CM | POA: Diagnosis not present

## 2014-09-01 DIAGNOSIS — I2510 Atherosclerotic heart disease of native coronary artery without angina pectoris: Secondary | ICD-10-CM

## 2014-09-01 DIAGNOSIS — Z01810 Encounter for preprocedural cardiovascular examination: Secondary | ICD-10-CM | POA: Insufficient documentation

## 2014-09-01 MED ORDER — ERGOCALCIFEROL 1.25 MG (50000 UT) PO CAPS: 50000.0000 [IU] | ORAL_CAPSULE | ORAL | Status: DC

## 2014-09-01 NOTE — Assessment & Plan Note (Signed)
Blood pressure is well controlled on today's visit. No changes made to the medications. 

## 2014-09-01 NOTE — Assessment & Plan Note (Signed)
Possibly secondary to statins, unable to exclude fatty liver. Weight has been relatively stable over the past 2 years. Elevated triglycerides has a rough indicator of his diet. Recommended a strict low calorie diet, starting an exercise program after his surgery is done

## 2014-09-01 NOTE — Progress Notes (Signed)
Patient ID: Casey Golden, male    DOB: [birth year], 64 y.o.   MRN: [id number]  HPI Comments: Casey Golden is a very pleasant 64 year old gentleman returns today for evaluation and management of his coronary artery disease, history of smoking for 27 years,  mixed hyperlipidemia, hypertension. he does report a syncopal episode many years ago while walking his dog. He was down for one minute. He had no warning. He has not had any further episodes since that time  In follow-up today, he reports that he is scheduled for testicular surgery 09/14/2014 in [place] with Dr. Junious Silk. Overall he feels well, denies any new symptoms concerning for angina. No shortness of breath, no leg swelling, no further near syncope or syncope He continues to work at [employer], long hours. Has been doing well recently Has had testicular swelling for quite some time, drained twice, now looking for to surgical intervention for permanent fix  He is currently taking Lipitor 20 mg daily. LFTs were normal range earlier in 2016, climbed to the 80, 90 range more recently in the past month Weight has been relatively stable. Denies alcohol, no excessive Tylenol Otherwise feels well. No regular exercise program but does do significant activity through his work  EKG shows normal sinus rhythm with rate 73 beats per minute, no significant ST or T wave changes  Other past medical history Previously had some sweating episodes at times Was previously on Symbicort but this was expensive for him, now uses albuterol as needed  He had a positive Myoview several years ago which resulted in a heart catheterization. He had nonobstructive disease. His normal left ventricular function.      Allergies  Allergen Reactions  . Sulfonamide Derivatives Hives  . Sulfa Antibiotics     Outpatient Encounter Prescriptions as of 09/01/2014  Medication Sig  . albuterol (PROVENTIL HFA;VENTOLIN HFA) 108 (90 BASE) MCG/ACT inhaler Inhale 2 puffs into  the lungs every 6 (six) hours as needed for wheezing.  Marland Kitchen amLODipine (NORVASC) 5 MG tablet Take 1 tablet (5 mg total) by mouth daily.  Marland Kitchen aspirin 81 MG EC tablet Take 81 mg by mouth daily.    Marland Kitchen atenolol (TENORMIN) 25 MG tablet Take 25 mg by mouth daily.    Marland Kitchen atorvastatin (LIPITOR) 20 MG tablet   . budesonide-formoterol (SYMBICORT) 160-4.5 MCG/ACT inhaler Inhale into the lungs as needed.   . ergocalciferol (VITAMIN D2) 50000 UNITS capsule Take 1 capsule (50,000 Units total) by mouth once a week.  Marland Kitchen lisinopril (PRINIVIL,ZESTRIL) 10 MG tablet Take 1 tablet (10 mg total) by mouth daily.  . metFORMIN (GLUCOPHAGE) 500 MG tablet Take 500 mg by mouth 2 (two) times daily with a meal.    . [DISCONTINUED] ergocalciferol (VITAMIN D2) 50000 UNITS capsule Take 50,000 Units by mouth once a week.  . [DISCONTINUED] omega-3 acid ethyl esters (LOVAZA) 1 G capsule Take 2 capsules (2 g total) by mouth 2 (two) times daily. (Patient not taking: Reported on 09/01/2014)   No facility-administered encounter medications on file as of 09/01/2014.    Past Medical History  Diagnosis Date  . Hyperlipidemia   . Hypertension   . Diabetes mellitus without complication   . CAD (coronary artery disease)     Followed by Dr. Rockey Situ  . Chronic kidney disease     Past Surgical History  Procedure Laterality Date  . Cardiac catheterization      x2  . Cholecystectomy    . Tonsillectomy    . Appendectomy    .  Spine surgery      Spinal fusion.  Marland Kitchen Hernia repair      umbilical hernia.    Social History  reports that he quit smoking about 24 years ago. He does not have any smokeless tobacco history on file. He reports that he does not drink alcohol or use illicit drugs.  Family History family history includes Cancer in his father; Diabetes in his mother; Heart attack in his mother; Heart disease in his mother, paternal grandfather, and paternal grandmother. There is no history of Coronary artery disease.   Review of Systems   Constitutional: Negative.   Respiratory: Negative.   Cardiovascular: Negative.   Gastrointestinal: Negative.   Genitourinary:       Testicular swelling  Musculoskeletal: Negative.   Skin: Negative.   Neurological: Negative.   Hematological: Negative.   Psychiatric/Behavioral: Negative.   All other systems reviewed and are negative.   BP 130/70 mmHg  Pulse 73  Ht 5\' 9"  (1.753 m)  Wt 252 lb 12 oz (114.647 kg)  BMI 37.31 kg/m2  Physical Exam  Constitutional: He is oriented to person, place, and time. He appears well-developed and well-nourished.  HENT:  Head: Normocephalic.  Nose: Nose normal.  Mouth/Throat: Oropharynx is clear and moist.  Eyes: Conjunctivae are normal. Pupils are equal, round, and reactive to light.  Neck: Normal range of motion. Neck supple. No JVD present.  Cardiovascular: Normal rate, regular rhythm, S1 normal, S2 normal, normal heart sounds and intact distal pulses.  Exam reveals no gallop and no friction rub.   No murmur heard. Pulmonary/Chest: Effort normal and breath sounds normal. No respiratory distress. He has no wheezes. He has no rales. He exhibits no tenderness.  Abdominal: Soft. Bowel sounds are normal. He exhibits no distension. There is no tenderness.  Genitourinary:  Testicular swelling not examined  Musculoskeletal: Normal range of motion. He exhibits no edema or tenderness.  Lymphadenopathy:    He has no cervical adenopathy.  Neurological: He is alert and oriented to person, place, and time. Coordination normal.  Skin: Skin is warm and dry. No rash noted. No erythema.  Psychiatric: He has a normal mood and affect. His behavior is normal. Judgment and thought content normal.      Assessment and Plan   Nursing note and vitals reviewed.

## 2014-09-01 NOTE — Assessment & Plan Note (Signed)
Currently with no symptoms of angina. No further workup at this time. Continue current medication regimen. 

## 2014-09-01 NOTE — Assessment & Plan Note (Signed)
He is acceptable risk for testicular surgery August 18. No further testing needed No recent symptoms of angina, blood pressure well controlled.

## 2014-09-01 NOTE — Patient Instructions (Signed)
You are doing well. No medication changes were made.  Repeat LFTS at the end of sept/beginning of October  Please call us if you have new issues that need to be addressed before your next appt.  Your physician wants you to follow-up in: 6 months.  You will receive a reminder letter in the mail two months in advance. If you don't receive a letter, please call our office to schedule the follow-up appointment.

## 2014-09-01 NOTE — Assessment & Plan Note (Signed)
Recommended he stay on his Lipitor for now. We'll recheck LFTs in late September, early October. Recommended he work on his weight. Suspect he will lose weight through over the next month given upcoming testicular surgery

## 2014-09-06 ENCOUNTER — Encounter (HOSPITAL_BASED_OUTPATIENT_CLINIC_OR_DEPARTMENT_OTHER): Payer: Self-pay | Admitting: *Deleted

## 2014-09-07 ENCOUNTER — Encounter (HOSPITAL_BASED_OUTPATIENT_CLINIC_OR_DEPARTMENT_OTHER): Payer: Self-pay | Admitting: *Deleted

## 2014-09-08 ENCOUNTER — Encounter (HOSPITAL_BASED_OUTPATIENT_CLINIC_OR_DEPARTMENT_OTHER): Payer: Self-pay | Admitting: *Deleted

## 2014-09-08 NOTE — Progress Notes (Signed)
NPO AFTER MN.  ARRIVE AT 0900.  NEEDS ISTAT, PTT, PT/INR.  CURRENT EKG IN CHART AND EPIC.

## 2014-09-14 ENCOUNTER — Encounter (HOSPITAL_BASED_OUTPATIENT_CLINIC_OR_DEPARTMENT_OTHER): Payer: Self-pay | Admitting: Anesthesiology

## 2014-09-14 ENCOUNTER — Ambulatory Visit (HOSPITAL_BASED_OUTPATIENT_CLINIC_OR_DEPARTMENT_OTHER): Payer: Managed Care, Other (non HMO) | Admitting: Anesthesiology

## 2014-09-14 ENCOUNTER — Encounter (HOSPITAL_BASED_OUTPATIENT_CLINIC_OR_DEPARTMENT_OTHER)
Admission: RE | Disposition: A | Discharge status: Home / Self Care | Payer: Self-pay | Source: Ambulatory Visit | Attending: Urology

## 2014-09-14 ENCOUNTER — Ambulatory Visit (HOSPITAL_BASED_OUTPATIENT_CLINIC_OR_DEPARTMENT_OTHER)
Admission: RE | Admit: 2014-09-14 | Discharge: 2014-09-14 | Disposition: A | Discharge status: Home / Self Care | Payer: Managed Care, Other (non HMO) | Source: Ambulatory Visit | Attending: Urology | Admitting: Urology

## 2014-09-14 DIAGNOSIS — Z7982 Long term (current) use of aspirin: Secondary | ICD-10-CM | POA: Insufficient documentation

## 2014-09-14 DIAGNOSIS — L918 Other hypertrophic disorders of the skin: Secondary | ICD-10-CM | POA: Insufficient documentation

## 2014-09-14 DIAGNOSIS — E669 Obesity, unspecified: Secondary | ICD-10-CM | POA: Insufficient documentation

## 2014-09-14 DIAGNOSIS — I10 Essential (primary) hypertension: Secondary | ICD-10-CM | POA: Insufficient documentation

## 2014-09-14 DIAGNOSIS — M1990 Unspecified osteoarthritis, unspecified site: Secondary | ICD-10-CM | POA: Diagnosis not present

## 2014-09-14 DIAGNOSIS — Z981 Arthrodesis status: Secondary | ICD-10-CM | POA: Insufficient documentation

## 2014-09-14 DIAGNOSIS — Z6834 Body mass index (BMI) 34.0-34.9, adult: Secondary | ICD-10-CM | POA: Diagnosis not present

## 2014-09-14 DIAGNOSIS — Z882 Allergy status to sulfonamides status: Secondary | ICD-10-CM | POA: Diagnosis not present

## 2014-09-14 DIAGNOSIS — G4733 Obstructive sleep apnea (adult) (pediatric): Secondary | ICD-10-CM | POA: Insufficient documentation

## 2014-09-14 DIAGNOSIS — I2510 Atherosclerotic heart disease of native coronary artery without angina pectoris: Secondary | ICD-10-CM | POA: Diagnosis not present

## 2014-09-14 DIAGNOSIS — K219 Gastro-esophageal reflux disease without esophagitis: Secondary | ICD-10-CM | POA: Insufficient documentation

## 2014-09-14 DIAGNOSIS — B356 Tinea cruris: Secondary | ICD-10-CM | POA: Insufficient documentation

## 2014-09-14 DIAGNOSIS — E119 Type 2 diabetes mellitus without complications: Secondary | ICD-10-CM | POA: Insufficient documentation

## 2014-09-14 DIAGNOSIS — E785 Hyperlipidemia, unspecified: Secondary | ICD-10-CM | POA: Diagnosis not present

## 2014-09-14 DIAGNOSIS — N433 Hydrocele, unspecified: Secondary | ICD-10-CM | POA: Insufficient documentation

## 2014-09-14 HISTORY — DX: Gastro-esophageal reflux disease without esophagitis: K21.9

## 2014-09-14 HISTORY — DX: Presence of dental prosthetic device (complete) (partial): Z97.2

## 2014-09-14 HISTORY — PX: EXCISION OF SKIN TAG: SHX6270

## 2014-09-14 HISTORY — DX: Type 2 diabetes mellitus without complications: E11.9

## 2014-09-14 HISTORY — PX: HYDROCELE EXCISION: SHX482

## 2014-09-14 HISTORY — DX: Other seasonal allergic rhinitis: J30.89

## 2014-09-14 HISTORY — DX: Other seasonal allergic rhinitis: J30.2

## 2014-09-14 HISTORY — DX: Unspecified osteoarthritis, unspecified site: M19.90

## 2014-09-14 HISTORY — DX: Presence of spectacles and contact lenses: Z97.3

## 2014-09-14 HISTORY — DX: Nocturia: R35.1

## 2014-09-14 HISTORY — DX: Rash and other nonspecific skin eruption: R21

## 2014-09-14 HISTORY — DX: Obstructive sleep apnea (adult) (pediatric): G47.33

## 2014-09-14 LAB — POCT I-STAT 4, (NA,K, GLUC, HGB,HCT)
Glucose, Bld: 153 mg/dL — ABNORMAL HIGH (ref 65–99)
HCT: 48 % (ref 39.0–52.0)
Hemoglobin: 16.3 g/dL (ref 13.0–17.0)
Potassium: 4.4 mmol/L (ref 3.5–5.1)
Sodium: 140 mmol/L (ref 135–145)

## 2014-09-14 LAB — GLUCOSE, CAPILLARY: Glucose-Capillary: 137 mg/dL — ABNORMAL HIGH (ref 65–99)

## 2014-09-14 LAB — APTT: aPTT: 30 seconds (ref 24–37)

## 2014-09-14 LAB — PROTIME-INR
INR: 1 (ref 0.00–1.49)
Prothrombin Time: 13.4 seconds (ref 11.6–15.2)

## 2014-09-14 SURGERY — HYDROCELECTOMY
Anesthesia: General | Site: Scrotum | Laterality: Right

## 2014-09-14 MED ORDER — BUPIVACAINE-EPINEPHRINE (PF) 0.25% -1:200000 IJ SOLN
INTRAMUSCULAR | Status: DC | PRN
  Administered 2014-09-14: 2 mL via INTRAMUSCULAR

## 2014-09-14 MED ORDER — FENTANYL CITRATE (PF) 100 MCG/2ML IJ SOLN
INTRAMUSCULAR | Status: DC | PRN
  Administered 2014-09-14 (×8): 25 ug via INTRAVENOUS

## 2014-09-14 MED ORDER — CEFAZOLIN SODIUM-DEXTROSE 2-3 GM-% IV SOLR
INTRAVENOUS | Status: AC
  Filled 2014-09-14: qty 50

## 2014-09-14 MED ORDER — FLUCONAZOLE 150 MG PO TABS: 150.0000 mg | ORAL_TABLET | Freq: Once | ORAL | Status: DC

## 2014-09-14 MED ORDER — FENTANYL CITRATE (PF) 100 MCG/2ML IJ SOLN
25.0000 ug | INTRAMUSCULAR | Status: DC | PRN
  Filled 2014-09-14: qty 1

## 2014-09-14 MED ORDER — ACETAMINOPHEN 10 MG/ML IV SOLN
INTRAVENOUS | Status: DC | PRN
  Administered 2014-09-14: 1000 mg via INTRAVENOUS

## 2014-09-14 MED ORDER — FENTANYL CITRATE (PF) 100 MCG/2ML IJ SOLN
INTRAMUSCULAR | Status: AC
  Filled 2014-09-14: qty 4

## 2014-09-14 MED ORDER — LIDOCAINE HCL (CARDIAC) 20 MG/ML IV SOLN
INTRAVENOUS | Status: DC | PRN
  Administered 2014-09-14: 80 mg via INTRAVENOUS

## 2014-09-14 MED ORDER — LIDOCAINE-EPINEPHRINE (PF) 1 %-1:200000 IJ SOLN
INTRAMUSCULAR | Status: DC | PRN
  Administered 2014-09-14: 2 mL

## 2014-09-14 MED ORDER — ASPIRIN 81 MG PO TBEC: 81.0000 mg | DELAYED_RELEASE_TABLET | Freq: Every day | ORAL | Status: AC

## 2014-09-14 MED ORDER — LACTATED RINGERS IV SOLN
INTRAVENOUS | Status: DC
  Administered 2014-09-14 (×2): via INTRAVENOUS
  Filled 2014-09-14: qty 1000

## 2014-09-14 MED ORDER — GLYCOPYRROLATE 0.2 MG/ML IJ SOLN
INTRAMUSCULAR | Status: DC | PRN
  Administered 2014-09-14: 0.2 mg via INTRAVENOUS

## 2014-09-14 MED ORDER — KETOROLAC TROMETHAMINE 30 MG/ML IJ SOLN
INTRAMUSCULAR | Status: DC | PRN
  Administered 2014-09-14: 30 mg via INTRAVENOUS

## 2014-09-14 MED ORDER — PROPOFOL 10 MG/ML IV BOLUS
INTRAVENOUS | Status: DC | PRN
  Administered 2014-09-14: 200 mg via INTRAVENOUS

## 2014-09-14 MED ORDER — CEFAZOLIN SODIUM 1-5 GM-% IV SOLN
1.0000 g | INTRAVENOUS | Status: DC
  Filled 2014-09-14: qty 50

## 2014-09-14 MED ORDER — CEFAZOLIN SODIUM-DEXTROSE 2-3 GM-% IV SOLR
2.0000 g | INTRAVENOUS | Status: AC
  Administered 2014-09-14: 2 g via INTRAVENOUS
  Filled 2014-09-14: qty 50

## 2014-09-14 MED ORDER — PROMETHAZINE HCL 25 MG/ML IJ SOLN
6.2500 mg | INTRAMUSCULAR | Status: DC | PRN
  Filled 2014-09-14: qty 1

## 2014-09-14 MED ORDER — MIDAZOLAM HCL 2 MG/2ML IJ SOLN
INTRAMUSCULAR | Status: AC
  Filled 2014-09-14: qty 2

## 2014-09-14 MED ORDER — DEXAMETHASONE SODIUM PHOSPHATE 10 MG/ML IJ SOLN
INTRAMUSCULAR | Status: DC | PRN
  Administered 2014-09-14: 10 mg via INTRAVENOUS

## 2014-09-14 MED ORDER — EPHEDRINE SULFATE 50 MG/ML IJ SOLN
INTRAMUSCULAR | Status: DC | PRN
  Administered 2014-09-14 (×3): 10 mg via INTRAVENOUS

## 2014-09-14 MED ORDER — CEPHALEXIN 500 MG PO CAPS: 500.0000 mg | ORAL_CAPSULE | Freq: Three times a day (TID) | ORAL | Status: DC

## 2014-09-14 MED ORDER — ONDANSETRON HCL 4 MG/2ML IJ SOLN
INTRAMUSCULAR | Status: DC | PRN
  Administered 2014-09-14: 4 mg via INTRAVENOUS

## 2014-09-14 MED ORDER — OXYCODONE-ACETAMINOPHEN 5-325 MG PO TABS: 1.0000 | ORAL_TABLET | Freq: Four times a day (QID) | ORAL | Status: DC | PRN

## 2014-09-14 MED ORDER — MIDAZOLAM HCL 5 MG/5ML IJ SOLN
INTRAMUSCULAR | Status: DC | PRN
  Administered 2014-09-14 (×2): 1 mg via INTRAVENOUS

## 2014-09-14 SURGICAL SUPPLY — 41 items
BLADE CLIPPER SURG (BLADE) ×4 IMPLANT
BLADE SURG 15 STRL LF DISP TIS (BLADE) ×2 IMPLANT
BLADE SURG 15 STRL SS (BLADE) ×2
BNDG GAUZE ELAST 4 BULKY (GAUZE/BANDAGES/DRESSINGS) ×4 IMPLANT
COVER BACK TABLE 60X90IN (DRAPES) ×4 IMPLANT
COVER MAYO STAND STRL (DRAPES) ×4 IMPLANT
DRAIN PENROSE 18X1/4 LTX STRL (WOUND CARE) IMPLANT
DRAPE PED LAPAROTOMY (DRAPES) ×4 IMPLANT
DRSG KUZMA FLUFF (GAUZE/BANDAGES/DRESSINGS) ×4 IMPLANT
DRSG TELFA 3X8 NADH (GAUZE/BANDAGES/DRESSINGS) ×4 IMPLANT
ELECT REM PT RETURN 9FT ADLT (ELECTROSURGICAL) ×4
ELECTRODE REM PT RTRN 9FT ADLT (ELECTROSURGICAL) ×2 IMPLANT
GAUZE SPONGE 4X4 16PLY XRAY LF (GAUZE/BANDAGES/DRESSINGS) ×4 IMPLANT
GLOVE BIOGEL M STRL SZ7.5 (GLOVE) ×4 IMPLANT
GLOVE BIOGEL PI IND STRL 7.5 (GLOVE) ×2 IMPLANT
GLOVE BIOGEL PI INDICATOR 7.5 (GLOVE) ×2
GLOVE INDICATOR 7.5 STRL GRN (GLOVE) ×4 IMPLANT
GOWN STRL REUS W/ TWL XL LVL3 (GOWN DISPOSABLE) ×2 IMPLANT
GOWN STRL REUS W/TWL XL LVL3 (GOWN DISPOSABLE) ×2
MANIFOLD NEPTUNE II (INSTRUMENTS) IMPLANT
NEEDLE HYPO 22GX1.5 SAFETY (NEEDLE) ×4 IMPLANT
NS IRRIG 500ML POUR BTL (IV SOLUTION) ×4 IMPLANT
PACK BASIN DAY SURGERY FS (CUSTOM PROCEDURE TRAY) ×4 IMPLANT
PENCIL BUTTON HOLSTER BLD 10FT (ELECTRODE) ×4 IMPLANT
SPONGE LAP 4X18 X RAY DECT (DISPOSABLE) ×4 IMPLANT
SUPPORT SCROTAL LG STRP (MISCELLANEOUS) ×3 IMPLANT
SUPPORT SCROTAL MED ADLT STRP (MISCELLANEOUS) IMPLANT
SUPPORTER ATHLETIC LG (MISCELLANEOUS) ×1
SUPPORTER ATHLETIC MED (MISCELLANEOUS)
SUT CHROMIC 3 0 PS 2 (SUTURE) ×4 IMPLANT
SUT CHROMIC 3 0 SH 27 (SUTURE) IMPLANT
SUT PDS AB 4-0 RB1 27 (SUTURE) IMPLANT
SUT VIC AB 2-0 SH 27 (SUTURE) ×4
SUT VIC AB 2-0 SH 27XBRD (SUTURE) ×4 IMPLANT
SYR BULB IRRIGATION 50ML (SYRINGE) ×4 IMPLANT
SYR CONTROL 10ML LL (SYRINGE) IMPLANT
TRAY DSU PREP LF (CUSTOM PROCEDURE TRAY) ×4 IMPLANT
TUBE CONNECTING 12'X1/4 (SUCTIONS) ×1
TUBE CONNECTING 12X1/4 (SUCTIONS) ×3 IMPLANT
WATER STERILE IRR 500ML POUR (IV SOLUTION) IMPLANT
YANKAUER SUCT BULB TIP NO VENT (SUCTIONS) ×4 IMPLANT

## 2014-09-14 NOTE — H&P (Signed)
H&P  Chief Complaint: Left hydrocele  History of Present Illness: Patient presents with bothersome enlarging left hydrocele. Plan for left hydrocelectomy. He has been well and without complaints.  Past Medical History  Diagnosis Date  . Hyperlipidemia   . Hypertension   . Type 2 diabetes mellitus   . GERD (gastroesophageal reflux disease)   . CAD (coronary artery disease)     Cardiologist--  Dr. Ida Rogue  . Arthritis   . Rash of genital area     09-08-2014  per pt Dr Junious Silk aware  . Nocturia   . Seasonal and perennial allergic rhinitis   . OSA (obstructive sleep apnea)     per pt no cpap due to sleep center/ insurance issue-- study done 2014  . Wears glasses   . Wears dentures     full upper/  partial lower   Past Surgical History  Procedure Laterality Date  . Tonsillectomy  age 35  . Appendectomy  age 20  . Cholecystectomy open  2006  . Abdominal hernia repair  2007      [facility]  . Cervical fusion  1992  . Cardiac catheterization  12-23-2007   [facility]    Abnormal myoview w/ ischemia/  40% mRCA with nonobstructive and no sig. plaque in his left system, ef 55%  . Cardiac catheterization  Apr 2008    [facility]    Abnormal myoview/  50% RCA,  ef 65%  . Cardiac catheterization  1999      [facility]    Home Medications:  Prescriptions prior to admission  Medication Sig Dispense Refill Last Dose  . aspirin 81 MG EC tablet Take 81 mg by mouth daily.     Past Week at Unknown time  . atenolol (TENORMIN) 25 MG tablet Take 25 mg by mouth every evening.    09/13/2014 at Unknown time  . atorvastatin (LIPITOR) 20 MG tablet Take 20 mg by mouth every evening.    09/13/2014 at Unknown time  . ergocalciferol (VITAMIN D2) 50000 UNITS capsule Take 1 capsule (50,000 Units total) by mouth once a week. 12 capsule 4 Past Month at Unknown time  . fenofibrate (TRICOR) 145 MG tablet Take 145 mg by mouth every evening.   09/13/2014 at Unknown time  . lisinopril (PRINIVIL,ZESTRIL) 10 MG tablet Take 1 tablet  (10 mg total) by mouth daily. (Patient taking differently: Take 10 mg by mouth every evening. ) 90 tablet 3 09/13/2014 at Unknown time  . metFORMIN (GLUCOPHAGE) 500 MG tablet Take 500 mg by mouth 2 (two) times daily with a meal.     09/13/2014 at Unknown time  . albuterol (PROVENTIL HFA;VENTOLIN HFA) 108 (90 BASE) MCG/ACT inhaler Inhale 2 puffs into the lungs every 6 (six) hours as needed for wheezing. 1 Inhaler 2 More than a month at Unknown time  . amLODipine (NORVASC) 5 MG tablet Take 1 tablet (5 mg total) by mouth daily. (Patient taking differently: Take 5 mg by mouth every evening. ) 90 tablet 0 Unknown at Unknown time  . budesonide-formoterol (SYMBICORT) 160-4.5 MCG/ACT inhaler Inhale into the lungs as needed.    More than a month at Unknown time   Allergies:  Allergies  Allergen Reactions  . Sulfa Antibiotics Hives    Family History  Problem Relation Age of Onset  . Coronary artery disease Neg Hx     Premature  . Heart attack Mother   . Heart disease Mother   . Diabetes Mother   . Cancer Father   .  Heart disease Paternal Grandmother   . Heart disease Paternal Grandfather    Social History:  reports that he quit smoking about 24 years ago. His smoking use included Cigarettes. He has a 70 pack-year smoking history. He has never used smokeless tobacco. He reports that he does not drink alcohol or use illicit drugs.  ROS: A complete review of systems was performed.  All systems are negative except for pertinent findings as noted. Review of Systems  All other systems reviewed and are negative.    Physical Exam:  Vital signs in last 24 hours: Temp:  [97.9 F (36.6 C)] 97.9 F (36.6 C) (08/16 0853) Pulse Rate:  [62] 62 (08/16 0853) Resp:  [18] 18 (08/16 0853) BP: (135)/(65) 135/65 mmHg (08/16 0853) SpO2:  [99 %] 99 % (08/16 0853) Weight:  [113.399 kg (250 lb)] 113.399 kg (250 lb) (08/16 0853) General:  Alert and oriented, No acute distress HEENT: Normocephalic,  atraumatic Cardiovascular: Regular rate and rhythm Lungs: Regular rate and effort Abdomen: Soft, nontender, nondistended, no abdominal masses Back: No CVA tenderness Extremities: No edema Neurologic: Grossly intact  Laboratory Data:  No results found for this or any previous visit (from the past 24 hour(s)). No results found for this or any previous visit (from the past 240 hour(s)). Creatinine: No results for input(s): CREATININE in the last 168 hours.  Impression/Assessment/plan: I discussed with the patient the nature, potential benefits, risks and alternatives to left hydrocelectomy, including side effects of the proposed treatment, the likelihood of the patient achieving the goals of the procedure, and any potential problems that might occur during the procedure or recuperation. We discussed some fluid around the testicle is normal for example we were not approached the right side or drain any of this fluid. We discussed risk of bleeding, infection and hematoma which can be significant after hydrocelectomy among other risks. We discussed postop care and limitations.  All questions answered. Patient elects to proceed.    Yan Pankratz 09/14/2014, 10:17 AM

## 2014-09-14 NOTE — Anesthesia Postprocedure Evaluation (Signed)
  Anesthesia Post-op Note  Patient: Casey Golden  Procedure(s) Performed: Procedure(s) (LRB): LEFT HYDROCELECTOMY ADULT (Left) EXCISION OF SKIN TAG (Right)  Patient Location: PACU  Anesthesia Type: General  Level of Consciousness: awake and alert   Airway and Oxygen Therapy: Patient Spontanous Breathing  Post-op Pain: mild  Post-op Assessment: Post-op Vital signs reviewed, Patient's Cardiovascular Status Stable, Respiratory Function Stable, Patent Airway and No signs of Nausea or vomiting  Last Vitals:  Filed Vitals:   09/14/14 1245  BP: 112/56  Pulse: 84  Temp:   Resp: 12    Post-op Vital Signs: stable   Complications: No apparent anesthesia complications

## 2014-09-14 NOTE — Anesthesia Preprocedure Evaluation (Addendum)
Anesthesia Evaluation  Patient identified by MRN, date of birth, ID band Patient awake    Reviewed: Allergy & Precautions, NPO status , Patient's Chart, lab work & pertinent test results  Airway Mallampati: II  TM Distance: >3 FB Neck ROM: Full    Dental no notable dental hx.    Pulmonary shortness of breath, sleep apnea , former smoker,  breath sounds clear to auscultation  Pulmonary exam normal       Cardiovascular hypertension, Pt. on medications and Pt. on home beta blockers + CAD Normal cardiovascular examRhythm:Regular Rate:Normal     Neuro/Psych negative neurological ROS  negative psych ROS   GI/Hepatic Neg liver ROS, GERD-  ,  Endo/Other  negative endocrine ROSdiabetes, Type 2, Oral Hypoglycemic Agents  Renal/GU negative Renal ROS  negative genitourinary   Musculoskeletal  (+) Arthritis -,   Abdominal (+) + obese,   Peds negative pediatric ROS (+)  Hematology negative hematology ROS (+)   Anesthesia Other Findings   Reproductive/Obstetrics negative OB ROS                            Anesthesia Physical Anesthesia Plan  ASA: III  Anesthesia Plan: General   Post-op Pain Management:    Induction: Intravenous  Airway Management Planned: LMA  Additional Equipment:   Intra-op Plan:   Post-operative Plan: Extubation in OR  Informed Consent: I have reviewed the patients History and Physical, chart, labs and discussed the procedure including the risks, benefits and alternatives for the proposed anesthesia with the patient or authorized representative who has indicated his/her understanding and acceptance.   Dental advisory given  Plan Discussed with: CRNA  Anesthesia Plan Comments:         Anesthesia Quick Evaluation

## 2014-09-14 NOTE — Op Note (Signed)
Preoperative diagnosis: Left hydrocele Postoperative diagnosis: Left hydrocele, right hemiscrotum skin tag, Tinea cruris  Procedure: Left hydrocelectomy; Excision of right hemiscrotal skin tag  Surgeon: Junious Silk  Anesthesia: Alert and any  Type of anesthesia: Gen.  Findings: Large left hydrocele 600 mL of straw-colored fluid, normal left testicle. 3 mm right hemiscrotal skin tag. Tinea cruris in the intertriginous folds of the left groin.  The right testicle was palpably normal.  Description of procedure: After consent was obtained patient brought to the operating room. After adequate anesthesia patient was prepped and draped in the usual sterile fashion. The left hemiscrotal incision was made and the dartos fascia dissected off the hydrocele sac. The sac was delivered and dissected clean. The cord and testicle were visible posteriorly. The anterior hydrocele sac was opened and the fluid drained. There was 600 mL of straw-colored fluid. The sac was opened superiorly and inferiorly completely. There was no peritoneal connection. The edges of the sac were trimmed and it was E everted behind the testicle and sewn together it with a running 20 Vicryls suture. Hemostasis was excellent. Hemostasis of the dartos and cremasteric layers was carefully insured. The testicle appeared normal and was palpably normal. The epididymis was palpably normal. The testicle was irrigated and placed back in the left hemiscrotum without torsion. The dartos fascia was closed with a running 20 Vicryls suture. The skin was closed with a running horizontal mattress suture. Toward the midline there was a small laceration from delivering the hydrocele sac and this was put back together with a few interrupted 3-0 chromic sutures. The patient was cleaned up and a sterile dressing of Telfa fluffs and scrotal support was placed. His awakened taken to recovery room in stable condition. There was a very small 3 mm flesh-colored skin  tag on the right hemiscrotum which was excised with the scalpel and the base cauterized.  Complications: None Blood loss: Minimal Specimens: None Drains: None

## 2014-09-14 NOTE — Transfer of Care (Signed)
Immediate Anesthesia Transfer of Care Note  Patient: Casey Golden  Procedure(s) Performed: Procedure(s) (LRB): LEFT HYDROCELECTOMY ADULT (Left) EXCISION OF SKIN TAG (Right)  Patient Location: PACU  Anesthesia Type: General  Level of Consciousness: awake, sedated, patient cooperative and responds to stimulation  Airway & Oxygen Therapy: Patient Spontanous Breathing and Patient connected to face mask oxygen  Post-op Assessment: Report given to PACU RN, Post -op Vital signs reviewed and stable and Patient moving all extremities  Post vital signs: Reviewed and stable  Complications: No apparent anesthesia complications

## 2014-09-14 NOTE — Anesthesia Procedure Notes (Signed)
Procedure Name: LMA Insertion Date/Time: 09/14/2014 10:28 AM Performed by: Justice Rocher Pre-anesthesia Checklist: Patient identified, Emergency Drugs available, Suction available and Patient being monitored Patient Re-evaluated:Patient Re-evaluated prior to inductionOxygen Delivery Method: Circle System Utilized Preoxygenation: Pre-oxygenation with 100% oxygen Intubation Type: IV induction Ventilation: Mask ventilation without difficulty LMA: LMA inserted LMA Size: 5.0 Number of attempts: 1 Airway Equipment and Method: Bite block Placement Confirmation: positive ETCO2 Tube secured with: Tape Dental Injury: Teeth and Oropharynx as per pre-operative assessment

## 2014-09-14 NOTE — Discharge Instructions (Signed)
Incision Care An incision is when a surgeon cuts into your body tissues. After surgery, the incision needs to be cared for properly to prevent infection.  HOME CARE INSTRUCTIONS   In the event that your dressing becomes wet, dirty, or starts to smell, change the dressing and call your surgeon for instructions as soon as possible.  No heavy lifting or strenuous activity for one week.   Ice the left scrotum 30 minutes on and 30 minutes off over next 12 - 24 hrs   Take showers. Do not take tub baths, swim, or do anything that may soak the wound until it is healed.  Resume your normal diet.  Do not pick or scratch at the wound.  Drink enough fluids to keep your urine clear or pale yellow. SEEK MEDICAL CARE IF:   You have redness, swelling, or increasing pain in the wound that is not controlled with medicine.  You have drainage, blood, or pus coming from the wound that lasts longer than 1 day.  You develop muscle aches, chills, or a general ill feeling.  You notice a bad smell coming from the wound or dressing.  Your wound edges separate after the sutures, staples, or skin adhesive strips have been removed.  You develop persistent nausea or vomiting. SEEK IMMEDIATE MEDICAL CARE IF:   You have a fever.  You develop a rash.  You develop dizzy episodes or faint while standing.  You have difficulty breathing.  You develop any reaction or side effects to medicine given. MAKE SURE YOU:   Understand these instructions.  Will watch your condition.  Will get help right away if you are not doing well or get worse. Document Released: 08/04/2004 Document Revised: 04/09/2011 Document Reviewed: 03/11/2013 [facility] Patient Information 2015 [place], [place]. This information is not intended to replace advice given to you by your health care provider. Make sure you discuss any questions you have with your health care provider.     HOME CARE INSTRUCTIONS FOR SCROTAL PROCEDURES  Wound  Care & Hygiene: You may apply an ice bag to the scrotum for the first 24 hours.  This may help decrease swelling and soreness.  You may have a dressing held in place by an athletic supporter.  You may remove the dressing in 24 hours and shower in 48 hours.  Continue to use the athletic supporter or tight briefs for at least a week. Activity: Rest today - not necessarily flat bed rest.  Just take it easy.  You should not do strenuous activities until your follow-up visit with your doctor.  You may resume light activity in 48 hours.  Return to Work:  Your doctor will advise you of this depending on the type of work you do  Diet: Drink liquids or eat a light diet this evening.  You may resume a regular diet tomorrow.  General Expectations: You may have a small amount of bleeding.  The scrotum may be swollen or bruised for about a week.  Call your Doctor if these occur:  -persistent or heavy bleeding  -temperature of 101 degrees or more  -severe pain, not relieved by your pain medication  Return to [employer]: Call to set up and appointment.      Post Anesthesia Home Care Instructions  Activity: Get plenty of rest for the remainder of the day. A responsible adult should stay with you for 24 hours following the procedure.  For the next 24 hours, DO NOT: -Drive a car Film/video editor -Drink  alcoholic beverages -Take any medication unless instructed by your physician -Make any legal decisions or sign important papers.  Meals: Start with liquid foods such as gelatin or soup. Progress to regular foods as tolerated. Avoid greasy, spicy, heavy foods. If nausea and/or vomiting occur, drink only clear liquids until the nausea and/or vomiting subsides. Call your physician if vomiting continues.  Special Instructions/Symptoms: Your throat may feel dry or sore from the anesthesia or the breathing tube placed in your throat during surgery. If this causes discomfort, gargle with warm  salt water. The discomfort should disappear within 24 hours.  If you had a scopolamine patch placed behind your ear for the management of post- operative nausea and/or vomiting:  1. The medication in the patch is effective for 72 hours, after which it should be removed.  Wrap patch in a tissue and discard in the trash. Wash hands thoroughly with soap and water. 2. You may remove the patch earlier than 72 hours if you experience unpleasant side effects which may include dry mouth, dizziness or visual disturbances. 3. Avoid touching the patch. Wash your hands with soap and water after contact with the patch.

## 2014-09-15 ENCOUNTER — Encounter (HOSPITAL_BASED_OUTPATIENT_CLINIC_OR_DEPARTMENT_OTHER): Payer: Self-pay | Admitting: Urology

## 2014-09-30 LAB — HM DIABETES EYE EXAM

## 2014-10-08 ENCOUNTER — Other Ambulatory Visit: Payer: Self-pay | Admitting: Cardiovascular Disease

## 2014-10-14 ENCOUNTER — Other Ambulatory Visit (INDEPENDENT_AMBULATORY_CARE_PROVIDER_SITE_OTHER): Payer: Managed Care, Other (non HMO) | Admitting: *Deleted

## 2014-10-14 DIAGNOSIS — E785 Hyperlipidemia, unspecified: Secondary | ICD-10-CM | POA: Diagnosis not present

## 2014-10-15 LAB — HEPATIC FUNCTION PANEL: Bilirubin, Direct: 0.15 mg/dL (ref 0.00–0.40)

## 2014-10-25 ENCOUNTER — Ambulatory Visit: Payer: Managed Care, Other (non HMO) | Admitting: Family Medicine

## 2014-11-09 ENCOUNTER — Telehealth: Payer: Self-pay | Admitting: Cardiovascular Disease

## 2014-11-09 ENCOUNTER — Other Ambulatory Visit: Payer: Self-pay | Admitting: Family Medicine

## 2014-11-09 NOTE — Telephone Encounter (Signed)
°  1. Which medications need to be refilled? fenofibrate (TRICOR) 145 MG tablet po daily   2. Which pharmacy is medication to be sent to?  [facility] [address] [place]   3. Do they need a 30 day or 90 day supply? 90  4. Would they like a call back once the medication has been sent to the pharmacy? [facility] with problems

## 2014-11-10 ENCOUNTER — Other Ambulatory Visit: Payer: Self-pay | Admitting: *Deleted

## 2014-11-10 MED ORDER — FENOFIBRATE 145 MG PO TABS: 145.0000 mg | ORAL_TABLET | Freq: Every evening | ORAL | Status: DC

## 2014-11-18 ENCOUNTER — Other Ambulatory Visit: Payer: Self-pay | Admitting: Cardiovascular Disease

## 2014-11-18 ENCOUNTER — Telehealth: Payer: Self-pay | Admitting: *Deleted

## 2014-11-18 MED ORDER — METFORMIN HCL 500 MG PO TABS: 500.0000 mg | ORAL_TABLET | Freq: Two times a day (BID) | ORAL | Status: DC

## 2014-11-18 NOTE — Telephone Encounter (Signed)
Pt calling requesting a refill on metformin 500 mg, pt stated that Dr. Rockey Situ stated that he would help with this medication until he sees his PCP in December. Please advise

## 2014-11-18 NOTE — Telephone Encounter (Signed)
Ok to renew medications

## 2014-11-18 NOTE — Telephone Encounter (Signed)
Pt is on metformin and is due for refill Has 2 days left. patient said that Dr Rockey Situ said  we could help him with these until pt go see's PCP in December

## 2014-11-19 NOTE — Telephone Encounter (Signed)
Reviewed for refill.

## 2014-11-20 ENCOUNTER — Other Ambulatory Visit: Payer: Self-pay | Admitting: Family Medicine

## 2015-01-04 ENCOUNTER — Ambulatory Visit (INDEPENDENT_AMBULATORY_CARE_PROVIDER_SITE_OTHER): Payer: Managed Care, Other (non HMO) | Admitting: Family Medicine

## 2015-01-04 ENCOUNTER — Encounter: Payer: Self-pay | Admitting: Family Medicine

## 2015-01-04 VITALS — BP 128/74 | HR 68 | Temp 97.8°F | Ht 69.0 in | Wt 250.5 lb

## 2015-01-04 DIAGNOSIS — E1169 Type 2 diabetes mellitus with other specified complication: Secondary | ICD-10-CM

## 2015-01-04 DIAGNOSIS — I2510 Atherosclerotic heart disease of native coronary artery without angina pectoris: Secondary | ICD-10-CM | POA: Diagnosis not present

## 2015-01-04 DIAGNOSIS — I10 Essential (primary) hypertension: Secondary | ICD-10-CM

## 2015-01-04 DIAGNOSIS — E785 Hyperlipidemia, unspecified: Secondary | ICD-10-CM

## 2015-01-04 DIAGNOSIS — R7989 Other specified abnormal findings of blood chemistry: Secondary | ICD-10-CM

## 2015-01-04 DIAGNOSIS — R945 Abnormal results of liver function studies: Secondary | ICD-10-CM

## 2015-01-04 DIAGNOSIS — E669 Obesity, unspecified: Secondary | ICD-10-CM

## 2015-01-04 DIAGNOSIS — E119 Type 2 diabetes mellitus without complications: Secondary | ICD-10-CM

## 2015-01-04 MED ORDER — METFORMIN HCL 500 MG PO TABS: 500.0000 mg | ORAL_TABLET | Freq: Two times a day (BID) | ORAL | Status: DC

## 2015-01-04 NOTE — Assessment & Plan Note (Signed)
Recheck next labs

## 2015-01-04 NOTE — Assessment & Plan Note (Signed)
Chronic, stable. Continue current regimen. 

## 2015-01-04 NOTE — Assessment & Plan Note (Signed)
Chronic, stable. Continue metformin 500mg  bid. Check A1c next labwork. Foot exam today.

## 2015-01-04 NOTE — Assessment & Plan Note (Signed)
Check FLP next labs. Continue lipitor, fenofibrate.

## 2015-01-04 NOTE — Progress Notes (Signed)
Pre visit review using our clinic review tool, if applicable. No additional management support is needed unless otherwise documented below in the visit note. 

## 2015-01-04 NOTE — Assessment & Plan Note (Signed)
Nonocclusive on latest cath.

## 2015-01-04 NOTE — Progress Notes (Signed)
BP 128/74 mmHg  Pulse 68  Temp(Src) 97.8 F (36.6 C) (Oral)  Ht 5\' 9"  (1.753 m)  Wt 250 lb 8 oz (113.626 kg)  BMI 36.98 kg/m2   CC: new pt to establish care  Subjective:    Patient ID: Casey Golden, male    DOB: [birth year], 64 y.o.   MRN: [id number]  HPI: Casey Golden is a 64 y.o. male presenting on 01/04/2015 for [place] patient to establish. Prior saw Dr Jacqualine Code. Now sees Dr Rockey Situ - non-obstructive heart catheterization several years ago with normal LV function (done after positive Myoview).   RAD - takes symbicort and albuterol when he gets bronchitis. In smoker, ?COPD.  HTN - Compliant with current antihypertensive regimen of amlodipine 5mg  daily, atenolol 25mg  daily, lisinporil 10mg  daily.  Does check blood pressures at home: and well controlled. No low blood pressure readings or symptoms of dizziness/syncope.  Denies HA, vision changes, CP/tightness, SOB, leg swelling.    DM - regularly does not check sugars. Compliant with antihyperglycemic regimen which includes: metformin 500mg  BID. Denies hypoglycemic symptoms. Occasional finger paresthesias. Last diabetic eye exam 09/2014. Pneumovax: ?Marland Kitchen Prevnar: ?. Pneumonia shot done by Dr Jacqualine Code. Records requested.  Lab Results  Component Value Date   HGBA1C 6.9* 07/21/2014   Diabetic Foot Exam - Simple   Simple Foot Form  Diabetic Foot exam was performed with the following findings:  Yes 01/04/2015  3:02 PM  Visual Inspection  No deformities, no ulcerations, no other skin breakdown bilaterally:  Yes  Sensation Testing  Intact to touch and monofilament testing bilaterally:  Yes  Pulse Check  Posterior Tibialis and Dorsalis pulse intact bilaterally:  Yes  Comments      HLD - compliant with lipitor 20mg  daily and fenofibrate 145mg  daily without myalgias. Recent elevated LFTs noted however. ?fatty liver. No EtOH use, no significant tylenol use.   Had hydrocele excision 08/2014 by Dr Junious Silk and tolerated well.    Relevant past medical, surgical, family and social history reviewed and updated as indicated. Interim medical history since our last visit reviewed. Allergies and medications reviewed and updated. Current Outpatient Prescriptions on File Prior to Visit  Medication Sig  . albuterol (PROVENTIL HFA;VENTOLIN HFA) 108 (90 BASE) MCG/ACT inhaler Inhale 2 puffs into the lungs every 6 (six) hours as needed for wheezing.  Marland Kitchen aspirin 81 MG EC tablet Take 1 tablet (81 mg total) by mouth daily.  Marland Kitchen atenolol (TENORMIN) 25 MG tablet TAKE 1 TABLET EVERY DAY  . atorvastatin (LIPITOR) 20 MG tablet Take 20 mg by mouth every evening.   . budesonide-formoterol (SYMBICORT) 160-4.5 MCG/ACT inhaler Inhale into the lungs as needed.   . ergocalciferol (VITAMIN D2) 50000 UNITS capsule Take 1 capsule (50,000 Units total) by mouth once a week.  . fenofibrate (TRICOR) 145 MG tablet Take 1 tablet (145 mg total) by mouth every evening.  Marland Kitchen lisinopril (PRINIVIL,ZESTRIL) 10 MG tablet Take 1 tablet (10 mg total) by mouth daily. (Patient taking differently: Take 10 mg by mouth every evening. )  . amLODipine (NORVASC) 5 MG tablet TAKE 1 TABLET (5 MG TOTAL) BY MOUTH DAILY. (Patient not taking: Reported on 01/04/2015)  . oxyCODONE-acetaminophen (ROXICET) 5-325 MG per tablet Take 1-2 tablets by mouth every 6 (six) hours as needed for severe pain. (Patient not taking: Reported on 01/04/2015)   No current facility-administered medications on file prior to visit.    Review of Systems Per HPI unless specifically indicated in ROS section  Objective:    BP 128/74 mmHg  Pulse 68  Temp(Src) 97.8 F (36.6 C) (Oral)  Ht 5\' 9"  (1.753 m)  Wt 250 lb 8 oz (113.626 kg)  BMI 36.98 kg/m2  Wt Readings from Last 3 Encounters:  01/04/15 250 lb 8 oz (113.626 kg)  09/14/14 250 lb (113.399 kg)  09/01/14 252 lb 12 oz (114.647 kg)    Physical Exam  Constitutional: He is oriented to person, place, and time. He appears well-developed and  well-nourished. No distress.  HENT:  Head: Normocephalic and atraumatic.  Right Ear: Hearing, tympanic membrane, external ear and ear canal normal.  Left Ear: Hearing, tympanic membrane, external ear and ear canal normal.  Nose: Nose normal.  Mouth/Throat: Uvula is midline, oropharynx is clear and moist and mucous membranes are normal. No oropharyngeal exudate, posterior oropharyngeal edema or posterior oropharyngeal erythema.  Eyes: Conjunctivae and EOM are normal. Pupils are equal, round, and reactive to light. No scleral icterus.  Neck: Normal range of motion. Neck supple.  Cardiovascular: Normal rate, regular rhythm, normal heart sounds and intact distal pulses.   No murmur heard. Pulses:      Radial pulses are 2+ on the right side, and 2+ on the left side.  Pulmonary/Chest: Effort normal and breath sounds normal. No respiratory distress. He has no wheezes. He has no rales.  Musculoskeletal: Normal range of motion. He exhibits no edema.  See HPI for foot exam if done  Lymphadenopathy:    He has no cervical adenopathy.  Neurological: He is alert and oriented to person, place, and time.  CN grossly intact, station and gait intact  Skin: Skin is warm and dry. No rash noted.  Psychiatric: He has a normal mood and affect.  Nursing note and vitals reviewed.     Assessment & Plan:   Problem List Items Addressed This Visit    HTN (hypertension) - Primary    Chronic, stable. Continue current regimen.      Dyslipidemia    Check FLP next labs. Continue lipitor, fenofibrate.      Diabetes mellitus type 2 in obese (HCC)    Chronic, stable. Continue metformin 500mg  bid. Check A1c next labwork. Foot exam today.      Relevant Medications   metFORMIN (GLUCOPHAGE) 500 MG tablet   CAD (coronary artery disease), native coronary artery    Nonocclusive on latest cath.       Abnormal LFTs    Recheck next labs.          Follow up plan: Return in about 3 months (around 04/04/2015), or  as needed, for annual exam, prior fasting for blood work.

## 2015-01-04 NOTE — Patient Instructions (Addendum)
Nice to meet you today, call us with questions. Sign release for records from Dr Jacqualine Code.  Return as needed or in 2-3 months for CPE.

## 2015-01-13 ENCOUNTER — Encounter: Payer: Self-pay | Admitting: Family Medicine

## 2015-01-13 ENCOUNTER — Ambulatory Visit (INDEPENDENT_AMBULATORY_CARE_PROVIDER_SITE_OTHER): Payer: Managed Care, Other (non HMO) | Admitting: Family Medicine

## 2015-01-13 VITALS — BP 148/72 | HR 72 | Temp 98.3°F | Wt 248.2 lb

## 2015-01-13 DIAGNOSIS — J209 Acute bronchitis, unspecified: Secondary | ICD-10-CM | POA: Insufficient documentation

## 2015-01-13 MED ORDER — AZITHROMYCIN 250 MG PO TABS: ORAL_TABLET | ORAL | Status: DC

## 2015-01-13 MED ORDER — GUAIFENESIN-CODEINE 100-10 MG/5ML PO SYRP: 5.0000 mL | ORAL_SOLUTION | Freq: Every evening | ORAL | Status: DC | PRN

## 2015-01-13 NOTE — Progress Notes (Signed)
BP 148/72 mmHg  Pulse 72  Temp(Src) 98.3 F (36.8 C) (Oral)  Wt 248 lb 4 oz (112.605 kg)  SpO2 97%   CC: chest congestion  Subjective:    Patient ID: Casey Golden, male    DOB: 1950/03/04, 64 y.o.   MRN: [id number]  HPI: Casey Golden is a 64 y.o. male presenting on 01/13/2015 for URI   Established care last week.   2d h/o chest congestion, worse yesterday - cough productive of heavy green mucous. Subjective fever/chills last night. Malaise, fatigue, body aches. Mild ST.   No headache, PNDrainage, ear or tooth pain.   Yesterday started mucinex, cough drops.   H/o RAD - usually takes symbicort and albuterol when he gets bronchitis. In ex smoker, ?COPD. Grandsons sick recently.  No smokers at home. Did receive flu shot this year.   Relevant past medical, surgical, family and social history reviewed and updated as indicated. Interim medical history since our last visit reviewed. Allergies and medications reviewed and updated. Current Outpatient Prescriptions on File Prior to Visit  Medication Sig  . albuterol (PROVENTIL HFA;VENTOLIN HFA) 108 (90 BASE) MCG/ACT inhaler Inhale 2 puffs into the lungs every 6 (six) hours as needed for wheezing.  Marland Kitchen aspirin 81 MG EC tablet Take 1 tablet (81 mg total) by mouth daily.  Marland Kitchen atenolol (TENORMIN) 25 MG tablet TAKE 1 TABLET EVERY DAY  . atorvastatin (LIPITOR) 20 MG tablet Take 20 mg by mouth every evening.   . budesonide-formoterol (SYMBICORT) 160-4.5 MCG/ACT inhaler Inhale into the lungs as needed.   . ergocalciferol (VITAMIN D2) 50000 UNITS capsule Take 1 capsule (50,000 Units total) by mouth once a week.  . fenofibrate (TRICOR) 145 MG tablet Take 1 tablet (145 mg total) by mouth every evening.  Marland Kitchen lisinopril (PRINIVIL,ZESTRIL) 10 MG tablet Take 1 tablet (10 mg total) by mouth daily. (Patient taking differently: Take 10 mg by mouth every evening. )  . metFORMIN (GLUCOPHAGE) 500 MG tablet Take 1 tablet (500 mg total) by mouth 2 (two) times  daily with a meal.  . amLODipine (NORVASC) 5 MG tablet TAKE 1 TABLET (5 MG TOTAL) BY MOUTH DAILY. (Patient not taking: Reported on 01/04/2015)   No current facility-administered medications on file prior to visit.    Review of Systems Per HPI unless specifically indicated in ROS section     Objective:    BP 148/72 mmHg  Pulse 72  Temp(Src) 98.3 F (36.8 C) (Oral)  Wt 248 lb 4 oz (112.605 kg)  SpO2 97%  Wt Readings from Last 3 Encounters:  01/13/15 248 lb 4 oz (112.605 kg)  01/04/15 250 lb 8 oz (113.626 kg)  09/14/14 250 lb (113.399 kg)    Physical Exam  Constitutional: He appears well-developed and well-nourished. No distress.  HENT:  Head: Normocephalic and atraumatic.  Right Ear: Hearing, tympanic membrane, external ear and ear canal normal.  Left Ear: Hearing, tympanic membrane, external ear and ear canal normal.  Nose: Mucosal edema present. No rhinorrhea. Right sinus exhibits no maxillary sinus tenderness and no frontal sinus tenderness. Left sinus exhibits no maxillary sinus tenderness and no frontal sinus tenderness.  Mouth/Throat: Uvula is midline, oropharynx is clear and moist and mucous membranes are normal. No oropharyngeal exudate, posterior oropharyngeal edema, posterior oropharyngeal erythema or tonsillar abscesses.  Nasal mucosal inflammation  Eyes: Conjunctivae and EOM are normal. Pupils are equal, round, and reactive to light. No scleral icterus.  Neck: Normal range of motion. Neck supple.  Cardiovascular: Normal rate, regular  rhythm, normal heart sounds and intact distal pulses.   No murmur heard. Pulmonary/Chest: Effort normal and breath sounds normal. No respiratory distress. He has no wheezes. He has no rales.  No wheezing. Productive cough present  Lymphadenopathy:    He has no cervical adenopathy.  Skin: Skin is warm and dry. No rash noted.  Nursing note and vitals reviewed.  Results for orders placed or performed in visit on 01/04/15  HM DIABETES EYE  EXAM  Result Value Ref Range   HM Diabetic Eye Exam No Retinopathy No Retinopathy      Assessment & Plan:   Problem List Items Addressed This Visit    Acute bronchitis - Primary    Anticipate viral given short duration. Supportive care discussed, rec plain mucinex, fluids, rest, may use cheratussin at night time for cough. Discussed albuterol/symbicort use. Given comorbidities, provided with WASP for zpack with indications to fill. Update if not improving as expected.Pt agrees with plan. Flu swab also done today.          Follow up plan: Return if symptoms worsen or fail to improve.

## 2015-01-13 NOTE — Assessment & Plan Note (Signed)
Anticipate viral given short duration. Supportive care discussed, rec plain mucinex, fluids, rest, may use cheratussin at night time for cough. Discussed albuterol/symbicort use. Given comorbidities, provided with WASP for zpack with indications to fill. Update if not improving as expected.Pt agrees with plan. Flu swab also done today.

## 2015-01-13 NOTE — Progress Notes (Signed)
Pre visit review using our clinic review tool, if applicable. No additional management support is needed unless otherwise documented below in the visit note. 

## 2015-01-13 NOTE — Patient Instructions (Addendum)
I do think you may be developing bronchitis but likely viral as early on.  Treat with plain mucinex with plenty of water to help mobilize mucous. May use cheratussin for night time cough. Push fluids and rest. Fill zpack antibiotic if no improvement noted over next several days.  May use albuterol/symbicort if you start developing wheezing or shortness of breath. Let us know if not improving with above treatment.

## 2015-02-25 ENCOUNTER — Ambulatory Visit (INDEPENDENT_AMBULATORY_CARE_PROVIDER_SITE_OTHER): Payer: Managed Care, Other (non HMO) | Admitting: Cardiovascular Disease

## 2015-02-25 ENCOUNTER — Encounter: Payer: Self-pay | Admitting: Cardiovascular Disease

## 2015-02-25 VITALS — BP 110/68 | HR 58 | Ht 72.0 in | Wt 250.5 lb

## 2015-02-25 DIAGNOSIS — I10 Essential (primary) hypertension: Secondary | ICD-10-CM | POA: Diagnosis not present

## 2015-02-25 DIAGNOSIS — R0602 Shortness of breath: Secondary | ICD-10-CM

## 2015-02-25 DIAGNOSIS — E669 Obesity, unspecified: Secondary | ICD-10-CM

## 2015-02-25 DIAGNOSIS — I2510 Atherosclerotic heart disease of native coronary artery without angina pectoris: Secondary | ICD-10-CM | POA: Diagnosis not present

## 2015-02-25 DIAGNOSIS — E1169 Type 2 diabetes mellitus with other specified complication: Secondary | ICD-10-CM

## 2015-02-25 DIAGNOSIS — E785 Hyperlipidemia, unspecified: Secondary | ICD-10-CM

## 2015-02-25 DIAGNOSIS — E119 Type 2 diabetes mellitus without complications: Secondary | ICD-10-CM

## 2015-02-25 MED ORDER — MECLIZINE HCL 25 MG PO TABS: 25.0000 mg | ORAL_TABLET | Freq: Three times a day (TID) | ORAL | Status: DC | PRN

## 2015-02-25 NOTE — Assessment & Plan Note (Signed)
goal LDL less than 70 If numbers are high, could add zetia 10 mg daily

## 2015-02-25 NOTE — Progress Notes (Signed)
Patient ID: Casey Golden, male    DOB: 1950-08-10, 65 y.o.   MRN: [id number]  HPI Comments: Mr Barrilleaux is a very pleasant 65 year old gentleman returns today for evaluation and management of his coronary artery disease,  He has 50% mid RCA disease by cardiac catheterization in 2009 with 30% also LAD disease  He has a history of smoking for 27 years,  mixed hyperlipidemia, hypertension. he does report a syncopal episode many years ago while walking his dog. He was down for one minute. He had no warning. He has not had any further episodes since that time He reports having cardiac catheterization 3 in the past Most recently at [facility]. Report has been requested  In follow-up, he reports that he is doing well. Active, works at [employer] Denies any significant chest pain on exertion No significant shortness of breath. No leg edema Previous lab work reviewed showing elevated LFTs No regular exercise program  EKG on today's visit shows no sinus rhythm with rate 58 bpm, no significant ST or T-wave changes  Cardiac catheterization report 05/13/2006 detailing normal ejection fraction, 50% mid RCA disease  cardiac catheterization November 2009 showing 40% mid RCA disease, normal ejection fraction  Other past medical history reviewed   testicular surgery 09/14/2014 in [place] with Dr. Junious Silk.  Previously had some sweating episodes at times Was previously on Symbicort but this was expensive for him, now uses albuterol as needed  He had a positive Myoview several years ago which resulted in a heart catheterization. He had nonobstructive disease. His normal left ventricular function.      Allergies  Allergen Reactions  . Sulfa Antibiotics Hives    Outpatient Encounter Prescriptions as of 02/25/2015  Medication Sig  . albuterol (PROVENTIL HFA;VENTOLIN HFA) 108 (90 BASE) MCG/ACT inhaler Inhale 2 puffs into the lungs every 6 (six) hours as needed for wheezing.  Marland Kitchen aspirin 81 MG EC tablet Take 1  tablet (81 mg total) by mouth daily.  Marland Kitchen atenolol (TENORMIN) 25 MG tablet TAKE 1 TABLET EVERY DAY  . atorvastatin (LIPITOR) 20 MG tablet Take 20 mg by mouth every evening.   . budesonide-formoterol (SYMBICORT) 160-4.5 MCG/ACT inhaler Inhale into the lungs as needed.   . ergocalciferol (VITAMIN D2) 50000 UNITS capsule Take 1 capsule (50,000 Units total) by mouth once a week.  . fenofibrate (TRICOR) 145 MG tablet Take 1 tablet (145 mg total) by mouth every evening.  Marland Kitchen lisinopril (PRINIVIL,ZESTRIL) 10 MG tablet Take 1 tablet (10 mg total) by mouth daily. (Patient taking differently: Take 10 mg by mouth every evening. )  . metFORMIN (GLUCOPHAGE) 500 MG tablet Take 1 tablet (500 mg total) by mouth 2 (two) times daily with a meal.  . [DISCONTINUED] amLODipine (NORVASC) 5 MG tablet TAKE 1 TABLET (5 MG TOTAL) BY MOUTH DAILY.  . meclizine (ANTIVERT) 25 MG tablet Take 1 tablet (25 mg total) by mouth 3 (three) times daily as needed.  . [DISCONTINUED] azithromycin (ZITHROMAX) 250 MG tablet Take two tablets on day one followed by one tablet on days 2-5 (Patient not taking: Reported on 02/25/2015)  . [DISCONTINUED] guaiFENesin-codeine (CHERATUSSIN AC) 100-10 MG/5ML syrup Take 5 mLs by mouth at bedtime as needed for cough (sedation precautions). (Patient not taking: Reported on 02/25/2015)   No facility-administered encounter medications on file as of 02/25/2015.    Past Medical History  Diagnosis Date  . Hyperlipidemia   . Hypertension   . Type 2 diabetes mellitus ([place])   . GERD (gastroesophageal reflux disease)   .  CAD (coronary artery disease)     Cardiologist--  Dr. Ida Rogue  . Arthritis   . Rash of genital area     09-08-2014  per pt Dr Junious Silk aware  . Nocturia   . Seasonal and perennial allergic rhinitis   . OSA (obstructive sleep apnea)     per pt no cpap due to sleep center/ insurance issue-- study done 2014  . Wears glasses   . Wears dentures     full upper/  partial lower  . History  of chicken pox   . Colonic polyp     Past Surgical History  Procedure Laterality Date  . Tonsillectomy  age 23  . Appendectomy  age 24  . Cholecystectomy open  2006  . Abdominal hernia repair  2007      [facility]    open repair  . Cervical fusion  1992  . Cardiac catheterization  12-23-2007   [facility]    Abnormal myoview w/ ischemia/  40% mRCA with nonobstructive and no sig. plaque in his left system, EF 55%  . Cardiac catheterization  Apr 2008    [facility]    Abnormal myoview/  50% RCA,  ef 65%  . Cardiac catheterization  1999      [facility]  . Hydrocele excision Left 09/14/2014    Procedure: LEFT HYDROCELECTOMY ADULT;  Surgeon: Festus Aloe, MD;  Location: [facility];  Service: Urology;  Laterality: Left;  . Excision of skin tag Right 09/14/2014    Procedure: EXCISION OF SKIN TAG;  Surgeon: Festus Aloe, MD;  Location: [facility];  Service: Urology;  Laterality: Right;    Social History  reports that he quit smoking about 25 years ago. His smoking use included Cigarettes. He has a 70 pack-year smoking history. He has never used smokeless tobacco. He reports that he does not drink alcohol or use illicit drugs.  Family History family history includes CAD in his mother; Cancer in his sister; Cancer (age of onset: 61) in his father; Diabetes in his mother; Heart disease in his paternal grandfather and paternal grandmother. There is no history of Coronary artery disease.   Review of Systems  Constitutional: Negative.   Respiratory: Negative.   Cardiovascular: Negative.   Gastrointestinal: Negative.   Musculoskeletal: Negative.   Neurological: Negative.   Hematological: Negative.   Psychiatric/Behavioral: Negative.   All other systems reviewed and are negative.   BP 110/68 mmHg  Pulse 58  Ht 6' (1.829 m)  Wt 250 lb 8 oz (113.626 kg)  BMI 33.97 kg/m2  Physical Exam  Constitutional: He is oriented to person, place, and time. He appears well-developed and  well-nourished.  HENT:  Head: Normocephalic.  Nose: Nose normal.  Mouth/Throat: Oropharynx is clear and moist.  Eyes: Conjunctivae are normal. Pupils are equal, round, and reactive to light.  Neck: Normal range of motion. Neck supple. No JVD present.  Cardiovascular: Normal rate, regular rhythm, S1 normal, S2 normal, normal heart sounds and intact distal pulses.  Exam reveals no gallop and no friction rub.   No murmur heard. Pulmonary/Chest: Effort normal and breath sounds normal. No respiratory distress. He has no wheezes. He has no rales. He exhibits no tenderness.  Abdominal: Soft. Bowel sounds are normal. He exhibits no distension. There is no tenderness.  Musculoskeletal: Normal range of motion. He exhibits no edema or tenderness.  Lymphadenopathy:    He has no cervical adenopathy.  Neurological: He is alert and oriented to person, place, and time. Coordination  normal.  Skin: Skin is warm and dry. No rash noted. No erythema.  Psychiatric: He has a normal mood and affect. His behavior is normal. Judgment and thought content normal.      Assessment and Plan   Nursing note and vitals reviewed.

## 2015-02-25 NOTE — Patient Instructions (Signed)
You are doing well. No medication changes were made.  Please call us if you have new issues that need to be addressed before your next appt.  Your physician wants you to follow-up in: 12 months.  You will receive a reminder letter in the mail two months in advance. If you don't receive a letter, please call our office to schedule the follow-up appointment. 

## 2015-02-25 NOTE — Assessment & Plan Note (Signed)
Blood pressure is well controlled on today's visit. No changes made to the medications. 

## 2015-02-25 NOTE — Assessment & Plan Note (Signed)
Currently with no symptoms of angina. No further workup at this time. Continue current medication regimen. Hemoglobin A1c 6.9

## 2015-02-25 NOTE — Assessment & Plan Note (Signed)
Currently with no symptoms of angina. No further workup at this time. Continue current medication regimen. 

## 2015-03-01 ENCOUNTER — Other Ambulatory Visit: Payer: Self-pay | Admitting: Family Medicine

## 2015-03-01 ENCOUNTER — Other Ambulatory Visit (INDEPENDENT_AMBULATORY_CARE_PROVIDER_SITE_OTHER): Payer: Managed Care, Other (non HMO)

## 2015-03-01 DIAGNOSIS — E1169 Type 2 diabetes mellitus with other specified complication: Secondary | ICD-10-CM

## 2015-03-01 DIAGNOSIS — Z125 Encounter for screening for malignant neoplasm of prostate: Secondary | ICD-10-CM

## 2015-03-01 DIAGNOSIS — E669 Obesity, unspecified: Secondary | ICD-10-CM

## 2015-03-01 DIAGNOSIS — E785 Hyperlipidemia, unspecified: Secondary | ICD-10-CM

## 2015-03-01 DIAGNOSIS — Z1159 Encounter for screening for other viral diseases: Secondary | ICD-10-CM

## 2015-03-01 DIAGNOSIS — I10 Essential (primary) hypertension: Secondary | ICD-10-CM

## 2015-03-01 DIAGNOSIS — E559 Vitamin D deficiency, unspecified: Secondary | ICD-10-CM

## 2015-03-01 DIAGNOSIS — E119 Type 2 diabetes mellitus without complications: Secondary | ICD-10-CM

## 2015-03-01 DIAGNOSIS — R7989 Other specified abnormal findings of blood chemistry: Secondary | ICD-10-CM

## 2015-03-01 DIAGNOSIS — R945 Abnormal results of liver function studies: Secondary | ICD-10-CM

## 2015-03-01 LAB — COMPREHENSIVE METABOLIC PANEL
ALT: 58 U/L — ABNORMAL HIGH (ref 0–53)
AST: 46 U/L — ABNORMAL HIGH (ref 0–37)
Albumin: 4.2 g/dL (ref 3.5–5.2)
Alkaline Phosphatase: 47 U/L (ref 39–117)
BUN: 19 mg/dL (ref 6–23)
CO2: 27 mEq/L (ref 19–32)
Calcium: 9.7 mg/dL (ref 8.4–10.5)
Chloride: 104 mEq/L (ref 96–112)
Creatinine, Ser: 1.04 mg/dL (ref 0.40–1.50)
GFR: 75.75 mL/min (ref >60.00)
Glucose, Bld: 142 mg/dL — ABNORMAL HIGH (ref 70–99)
Potassium: 4.9 mEq/L (ref 3.5–5.1)
Sodium: 141 mEq/L (ref 135–145)
Total Bilirubin: 0.6 mg/dL (ref 0.2–1.2)
Total Protein: 7.4 g/dL (ref 6.0–8.3)

## 2015-03-01 LAB — LIPID PANEL
Cholesterol: 112 mg/dL (ref 0–200)
HDL: 30.8 mg/dL — ABNORMAL LOW (ref >39.00)
LDL Cholesterol: 57 mg/dL (ref 0–99)
NonHDL: 81.68
Total CHOL/HDL Ratio: 4
Triglycerides: 124 mg/dL (ref 0.0–149.0)
VLDL: 24.8 mg/dL (ref 0.0–40.0)

## 2015-03-01 LAB — PSA, MEDICARE: PSA: 1.82 ng/ml (ref 0.10–4.00)

## 2015-03-01 LAB — HEMOGLOBIN A1C: Hgb A1c MFr Bld: 7.1 % — ABNORMAL HIGH (ref 4.6–6.5)

## 2015-03-01 LAB — VITAMIN D 25 HYDROXY (VIT D DEFICIENCY, FRACTURES): VITD: 41.39 ng/mL (ref 30.00–100.00)

## 2015-03-02 LAB — HEPATITIS C ANTIBODY: HCV Ab: NEGATIVE

## 2015-03-03 ENCOUNTER — Other Ambulatory Visit: Payer: Managed Care, Other (non HMO)

## 2015-03-07 ENCOUNTER — Encounter: Payer: Self-pay | Admitting: Family Medicine

## 2015-03-07 ENCOUNTER — Ambulatory Visit (INDEPENDENT_AMBULATORY_CARE_PROVIDER_SITE_OTHER): Payer: Managed Care, Other (non HMO) | Admitting: Family Medicine

## 2015-03-07 VITALS — BP 132/78 | HR 60 | Temp 97.7°F | Wt 252.2 lb

## 2015-03-07 DIAGNOSIS — I10 Essential (primary) hypertension: Secondary | ICD-10-CM

## 2015-03-07 DIAGNOSIS — Z0000 Encounter for general adult medical examination without abnormal findings: Secondary | ICD-10-CM | POA: Diagnosis not present

## 2015-03-07 DIAGNOSIS — E66811 Obesity, class 1: Secondary | ICD-10-CM

## 2015-03-07 DIAGNOSIS — E1165 Type 2 diabetes mellitus with hyperglycemia: Secondary | ICD-10-CM

## 2015-03-07 DIAGNOSIS — E1169 Type 2 diabetes mellitus with other specified complication: Secondary | ICD-10-CM

## 2015-03-07 DIAGNOSIS — Z7189 Other specified counseling: Secondary | ICD-10-CM | POA: Insufficient documentation

## 2015-03-07 DIAGNOSIS — R7401 Elevation of levels of liver transaminase levels: Secondary | ICD-10-CM

## 2015-03-07 DIAGNOSIS — I2510 Atherosclerotic heart disease of native coronary artery without angina pectoris: Secondary | ICD-10-CM

## 2015-03-07 DIAGNOSIS — E669 Obesity, unspecified: Secondary | ICD-10-CM | POA: Insufficient documentation

## 2015-03-07 DIAGNOSIS — R740 Nonspecific elevation of levels of transaminase and lactic acid dehydrogenase [LDH]: Secondary | ICD-10-CM

## 2015-03-07 DIAGNOSIS — E785 Hyperlipidemia, unspecified: Secondary | ICD-10-CM

## 2015-03-07 DIAGNOSIS — E118 Type 2 diabetes mellitus with unspecified complications: Secondary | ICD-10-CM

## 2015-03-07 DIAGNOSIS — IMO0002 Reserved for concepts with insufficient information to code with codable children: Secondary | ICD-10-CM

## 2015-03-07 NOTE — Assessment & Plan Note (Signed)
Chronic, stable. Continue current regimen. 

## 2015-03-07 NOTE — Assessment & Plan Note (Signed)
Advanced directive discussion - discussed. Planning on making appt with attorney. Packet provided today.

## 2015-03-07 NOTE — Assessment & Plan Note (Signed)
Chronic, great control on lipitor. HDL remains low.

## 2015-03-07 NOTE — Assessment & Plan Note (Signed)
Preventative protocols reviewed and updated unless pt declined. Discussed healthy diet and lifestyle.  

## 2015-03-07 NOTE — Progress Notes (Signed)
Pre visit review using our clinic review tool, if applicable. No additional management support is needed unless otherwise documented below in the visit note. 

## 2015-03-07 NOTE — Assessment & Plan Note (Signed)
Chronic, A1c increased to 7.1%. Reviewed healthy diet and lifestyle changes to keep diabetes under control.

## 2015-03-07 NOTE — Assessment & Plan Note (Signed)
Discussed healthy diet and lifestyle changes to affect sustainable weight loss  

## 2015-03-07 NOTE — Progress Notes (Signed)
BP 132/78 mmHg  Pulse 60  Temp(Src) 97.7 F (36.5 C) (Oral)  Wt 252 lb 4 oz (114.42 kg)   CC: CPE  Subjective:    Patient ID: Casey Golden, male    DOB: 12/06/1950, 65 y.o.   MRN: [id number]  HPI: Casey Golden is a 65 y.o. male presenting on 03/07/2015 for Annual Exam   RAD - has been taking proventil daily. No significant allergic rhinitis. Dusty work environment [employer] pl[employer]).  Preventative: Colon cancer screening - has had colonoscopy >25yrs, no records Prostate cancer screening - discussed pros/cons of screening. Opts to screen today and if normal declines further screening Lung cancer screening - does not meet criteria Flu shot - yearly Tetanus shot - states this has been done Pneumonia shot - states this has been done Shingles shot - ~2012 Advanced directive discussion - discussed. Planning on making appt with attorney. Packet provided today. Seat belt use discussed Sunscreen use and skin screen discussed. Sees derm.  Lives with wife, dog and cats Occupation: retired, was self employed, now works at [employer]  Edu: HS Activity: walks 1.5 mi daily Diet: some water, fruits/vegetables daily  Relevant past medical, surgical, family and social history reviewed and updated as indicated. Interim medical history since our last visit reviewed. Allergies and medications reviewed and updated. Current Outpatient Prescriptions on File Prior to Visit  Medication Sig  . albuterol (PROVENTIL HFA;VENTOLIN HFA) 108 (90 BASE) MCG/ACT inhaler Inhale 2 puffs into the lungs every 6 (six) hours as needed for wheezing.  Marland Kitchen aspirin 81 MG EC tablet Take 1 tablet (81 mg total) by mouth daily.  Marland Kitchen atenolol (TENORMIN) 25 MG tablet TAKE 1 TABLET EVERY DAY  . atorvastatin (LIPITOR) 20 MG tablet Take 20 mg by mouth every evening.   . budesonide-formoterol (SYMBICORT) 160-4.5 MCG/ACT inhaler Inhale into the lungs as needed.   . ergocalciferol (VITAMIN D2) 50000 UNITS capsule Take 1 capsule (50,000  Units total) by mouth once a week.  . fenofibrate (TRICOR) 145 MG tablet Take 1 tablet (145 mg total) by mouth every evening.  Marland Kitchen lisinopril (PRINIVIL,ZESTRIL) 10 MG tablet Take 1 tablet (10 mg total) by mouth daily. (Patient taking differently: Take 10 mg by mouth every evening. )  . meclizine (ANTIVERT) 25 MG tablet Take 1 tablet (25 mg total) by mouth 3 (three) times daily as needed.  . metFORMIN (GLUCOPHAGE) 500 MG tablet Take 1 tablet (500 mg total) by mouth 2 (two) times daily with a meal.   No current facility-administered medications on file prior to visit.    Review of Systems  Constitutional: Positive for fever. Negative for chills, activity change, appetite change, fatigue and unexpected weight change.  HENT: Positive for congestion. Negative for hearing loss.   Eyes: Negative for visual disturbance.  Respiratory: Positive for cough (recent bronchitis) and wheezing (mild). Negative for chest tightness and shortness of breath.   Cardiovascular: Negative for chest pain, palpitations and leg swelling.  Gastrointestinal: Positive for diarrhea and blood in stool (hemorrhoids). Negative for nausea, vomiting, abdominal pain, constipation and abdominal distention.  Genitourinary: Negative for hematuria and difficulty urinating.  Musculoskeletal: Negative for myalgias, arthralgias and neck pain.  Skin: Negative for rash.  Neurological: Positive for dizziness (vertigo treated with meclizine). Negative for seizures, syncope and headaches.  Hematological: Negative for adenopathy. Does not bruise/bleed easily.  Psychiatric/Behavioral: Negative for dysphoric mood. The patient is not nervous/anxious.    Per HPI unless specifically indicated in ROS section     Objective:  BP 132/78 mmHg  Pulse 60  Temp(Src) 97.7 F (36.5 C) (Oral)  Wt 252 lb 4 oz (114.42 kg)  Wt Readings from Last 3 Encounters:  03/07/15 252 lb 4 oz (114.42 kg)  02/25/15 250 lb 8 oz (113.626 kg)  01/13/15 248 lb 4 oz  (112.605 kg)   Body mass index is 34.2 kg/(m^2).  Physical Exam  Constitutional: He is oriented to person, place, and time. He appears well-developed and well-nourished. No distress.  HENT:  Head: Normocephalic and atraumatic.  Right Ear: Hearing, tympanic membrane, external ear and ear canal normal.  Left Ear: Hearing, tympanic membrane, external ear and ear canal normal.  Nose: Nose normal.  Mouth/Throat: Uvula is midline, oropharynx is clear and moist and mucous membranes are normal. No oropharyngeal exudate, posterior oropharyngeal edema or posterior oropharyngeal erythema.  Eyes: Conjunctivae and EOM are normal. Pupils are equal, round, and reactive to light. No scleral icterus.  Neck: Normal range of motion. Neck supple. Carotid bruit is not present. No thyromegaly present.  Cardiovascular: Normal rate, regular rhythm, normal heart sounds and intact distal pulses.   No murmur heard. Pulses:      Radial pulses are 2+ on the right side, and 2+ on the left side.  Pulmonary/Chest: Effort normal and breath sounds normal. No respiratory distress. He has no wheezes. He has no rales.  Abdominal: Soft. Bowel sounds are normal. He exhibits no distension and no mass. There is no tenderness. There is no rebound and no guarding.  Musculoskeletal: Normal range of motion. He exhibits no edema.  Lymphadenopathy:    He has no cervical adenopathy.  Neurological: He is alert and oriented to person, place, and time.  CN grossly intact, station and gait intact  Skin: Skin is warm and dry. No rash noted.  Psychiatric: He has a normal mood and affect. His behavior is normal. Judgment and thought content normal.  Nursing note and vitals reviewed.  Results for orders placed or performed in visit on 03/01/15  Lipid panel  Result Value Ref Range   Cholesterol 112 0 - 200 mg/dL   Triglycerides 124.0 0.0 - 149.0 mg/dL   HDL 30.80 (L) >39.00 mg/dL   VLDL 24.8 0.0 - 40.0 mg/dL   LDL Cholesterol 57 0 - 99  mg/dL   Total CHOL/HDL Ratio 4    NonHDL 81.68   Comprehensive metabolic panel  Result Value Ref Range   Sodium 141 135 - 145 mEq/L   Potassium 4.9 3.5 - 5.1 mEq/L   Chloride 104 96 - 112 mEq/L   CO2 27 19 - 32 mEq/L   Glucose, Bld 142 (H) 70 - 99 mg/dL   BUN 19 6 - 23 mg/dL   Creatinine, Ser 1.04 0.40 - 1.50 mg/dL   Total Bilirubin 0.6 0.2 - 1.2 mg/dL   Alkaline Phosphatase 47 39 - 117 U/L   AST 46 (H) 0 - 37 U/L   ALT 58 (H) 0 - 53 U/L   Total Protein 7.4 6.0 - 8.3 g/dL   Albumin 4.2 3.5 - 5.2 g/dL   Calcium 9.7 8.4 - 10.5 mg/dL   GFR 75.75 >60.00 mL/min  Hemoglobin A1c  Result Value Ref Range   Hgb A1c MFr Bld 7.1 (H) 4.6 - 6.5 %  VITAMIN D 25 Hydroxy (Vit-D Deficiency, Fractures)  Result Value Ref Range   VITD 41.39 30.00 - 100.00 ng/mL  PSA, Medicare  Result Value Ref Range   PSA 1.82 0.10 - 4.00 ng/ml  Assessment & Plan:   Problem List Items Addressed This Visit    Transaminitis    Anticipate fatty liver related. Continue to monitor. Pt states he saw GI clinic in past       Obesity, Class I, BMI 30-34.9    Discussed healthy diet and lifestyle changes to affect sustainable weight loss.      HTN (hypertension)    Chronic, stable. Continue current regimen.      Health maintenance examination - Primary    Preventative protocols reviewed and updated unless pt declined. Discussed healthy diet and lifestyle.       Dyslipidemia associated with type 2 diabetes mellitus (HCC)    Chronic, great control on lipitor. HDL remains low.      Diabetes mellitus type 2, uncontrolled, with complications ([facility])    Chronic, A1c increased to 7.1%. Reviewed healthy diet and lifestyle changes to keep diabetes under control.      CAD (coronary artery disease), native coronary artery    Chronic, stable, asxs. Appreciate cards care of patient.      Advanced care planning/counseling discussion    Advanced directive discussion - discussed. Planning on making appt with  attorney. Packet provided today.          Follow up plan: Return in about 6 months (around 09/04/2015), or as needed, for follow up visit.

## 2015-03-07 NOTE — Assessment & Plan Note (Signed)
Anticipate fatty liver related. Continue to monitor. Pt states he saw GI clinic in past

## 2015-03-07 NOTE — Patient Instructions (Addendum)
Sign release for records from Dr Jacqualine Code. You are doing well today Advanced directive packet provided today. Return in 6 months for diabetes follow up.  Health Maintenance, Male A healthy lifestyle and preventative care can promote health and wellness.  Maintain regular health, dental, and eye exams.  Eat a healthy diet. Foods like vegetables, fruits, whole grains, low-fat dairy products, and lean protein foods contain the nutrients you need and are low in calories. Decrease your intake of foods high in solid fats, added sugars, and salt. Get information about a proper diet from your health care provider, if necessary.  Regular physical exercise is one of the most important things you can do for your health. Most adults should get at least 150 minutes of moderate-intensity exercise (any activity that increases your heart rate and causes you to sweat) each week. In addition, most adults need muscle-strengthening exercises on 2 or more days a week.   Maintain a healthy weight. The body mass index (BMI) is a screening tool to identify possible weight problems. It provides an estimate of body fat based on height and weight. Your health care provider can find your BMI and can help you achieve or maintain a healthy weight. For males 20 years and older:  A BMI below 18.5 is considered underweight.  A BMI of 18.5 to 24.9 is normal.  A BMI of 25 to 29.9 is considered overweight.  A BMI of 30 and above is considered obese.  Maintain normal blood lipids and cholesterol by exercising and minimizing your intake of saturated fat. Eat a balanced diet with plenty of fruits and vegetables. Blood tests for lipids and cholesterol should begin at age 38 and be repeated every 5 years. If your lipid or cholesterol levels are high, you are over age 24, or you are at high risk for heart disease, you may need your cholesterol levels checked more frequently.Ongoing high lipid and cholesterol levels should be treated  with medicines if diet and exercise are not working.  If you smoke, find out from your health care provider how to quit. If you do not use tobacco, do not start.  Lung cancer screening is recommended for adults aged 45-80 years who are at high risk for developing lung cancer because of a history of smoking. A yearly low-dose CT scan of the lungs is recommended for people who have at least a 30-pack-year history of smoking and are current smokers or have quit within the past 15 years. A pack year of smoking is smoking an average of 1 pack of cigarettes a day for 1 year (for example, a 30-pack-year history of smoking could mean smoking 1 pack a day for 30 years or 2 packs a day for 15 years). Yearly screening should continue until the smoker has stopped smoking for at least 15 years. Yearly screening should be stopped for people who develop a health problem that would prevent them from having lung cancer treatment.  If you choose to drink alcohol, do not have more than 2 drinks per day. One drink is considered to be 12 oz (360 mL) of beer, 5 oz (150 mL) of wine, or 1.5 oz (45 mL) of liquor.  Avoid the use of street drugs. Do not share needles with anyone. Ask for help if you need support or instructions about stopping the use of drugs.  High blood pressure causes heart disease and increases the risk of stroke. High blood pressure is more likely to develop in:  People who have  blood pressure in the end of the normal range (100-139/85-89 mm Hg).  People who are overweight or obese.  People who are African American.  If you are 19-69 years of age, have your blood pressure checked every 3-5 years. If you are 36 years of age or older, have your blood pressure checked every year. You should have your blood pressure measured twice--once when you are at a hospital or clinic, and once when you are not at a hospital or clinic. Record the average of the two measurements. To check your blood pressure when you  are not at a hospital or clinic, you can use:  An automated blood pressure machine at a pharmacy.  A home blood pressure monitor.  If you are 59-63 years old, ask your health care provider if you should take aspirin to prevent heart disease.  Diabetes screening involves taking a blood sample to check your fasting blood sugar level. This should be done once every 3 years after age 69 if you are at a normal weight and without risk factors for diabetes. Testing should be considered at a younger age or be carried out more frequently if you are overweight and have at least 1 risk factor for diabetes.  Colorectal cancer can be detected and often prevented. Most routine colorectal cancer screening begins at the age of 28 and continues through age 53. However, your health care provider may recommend screening at an earlier age if you have risk factors for colon cancer. On a yearly basis, your health care provider may provide home test kits to check for hidden blood in the stool. A small camera at the end of a tube may be used to directly examine the colon (sigmoidoscopy or colonoscopy) to detect the earliest forms of colorectal cancer. Talk to your health care provider about this at age 13 when routine screening begins. A direct exam of the colon should be repeated every 5-10 years through age 81, unless early forms of precancerous polyps or small growths are found.  People who are at an increased risk for hepatitis B should be screened for this virus. You are considered at high risk for hepatitis B if:  You were born in a country where hepatitis B occurs often. Talk with your health care provider about which countries are considered high risk.  Your parents were born in a high-risk country and you have not received a shot to protect against hepatitis B (hepatitis B vaccine).  You have HIV or AIDS.  You use needles to inject street drugs.  You live with, or have sex with, someone who has hepatitis  B.  You are a man who has sex with other men (MSM).  You get hemodialysis treatment.  You take certain medicines for conditions like cancer, organ transplantation, and autoimmune conditions.  Hepatitis C blood testing is recommended for all people born from [birth year] through 1967 and any individual with known risk factors for hepatitis C.  Healthy men should no longer receive prostate-specific antigen (PSA) blood tests as part of routine cancer screening. Talk to your health care provider about prostate cancer screening.  Testicular cancer screening is not recommended for adolescents or adult males who have no symptoms. Screening includes self-exam, a health care provider exam, and other screening tests. Consult with your health care provider about any symptoms you have or any concerns you have about testicular cancer.  Practice safe sex. Use condoms and avoid high-risk sexual practices to reduce the spread of sexually transmitted infections (  STIs).  You should be screened for STIs, including gonorrhea and chlamydia if:  You are sexually active and are younger than 24 years.  You are older than 24 years, and your health care provider tells you that you are at risk for this type of infection.  Your sexual activity has changed since you were last screened, and you are at an increased risk for chlamydia or gonorrhea. Ask your health care provider if you are at risk.  If you are at risk of being infected with HIV, it is recommended that you take a prescription medicine daily to prevent HIV infection. This is called pre-exposure prophylaxis (PrEP). You are considered at risk if:  You are a man who has sex with other men (MSM).  You are a heterosexual man who is sexually active with multiple partners.  You take drugs by injection.  You are sexually active with a partner who has HIV.  Talk with your health care provider about whether you are at high risk of being infected with HIV. If you  choose to begin PrEP, you should first be tested for HIV. You should then be tested every 3 months for as long as you are taking PrEP.  Use sunscreen. Apply sunscreen liberally and repeatedly throughout the day. You should seek shade when your shadow is shorter than you. Protect yourself by wearing long sleeves, pants, a wide-brimmed hat, and sunglasses year round whenever you are outdoors.  Tell your health care provider of new moles or changes in moles, especially if there is a change in shape or color. Also, tell your health care provider if a mole is larger than the size of a pencil eraser.  A one-time screening for abdominal aortic aneurysm (AAA) and surgical repair of large AAAs by ultrasound is recommended for men aged 21-75 years who are current or former smokers.  Stay current with your vaccines (immunizations).   This information is not intended to replace advice given to you by your health care provider. Make sure you discuss any questions you have with your health care provider.   Document Released: 07/14/2007 Document Revised: 02/05/2014 Document Reviewed: 06/12/2010 Elsevier Interactive Patient Education [employer].

## 2015-03-07 NOTE — Assessment & Plan Note (Signed)
Chronic, stable, asxs. Appreciate cards care of patient.

## 2015-03-10 ENCOUNTER — Other Ambulatory Visit: Payer: Self-pay | Admitting: Cardiovascular Disease

## 2015-04-28 DIAGNOSIS — L570 Actinic keratosis: Secondary | ICD-10-CM | POA: Diagnosis not present

## 2015-04-28 DIAGNOSIS — Z85828 Personal history of other malignant neoplasm of skin: Secondary | ICD-10-CM | POA: Diagnosis not present

## 2015-04-28 DIAGNOSIS — S70361A Insect bite (nonvenomous), right thigh, initial encounter: Secondary | ICD-10-CM | POA: Diagnosis not present

## 2015-04-28 DIAGNOSIS — Z1283 Encounter for screening for malignant neoplasm of skin: Secondary | ICD-10-CM | POA: Diagnosis not present

## 2015-04-28 DIAGNOSIS — D1800 Hemangioma unspecified site: Secondary | ICD-10-CM | POA: Diagnosis not present

## 2015-04-28 DIAGNOSIS — L821 Other seborrheic keratosis: Secondary | ICD-10-CM | POA: Diagnosis not present

## 2015-04-28 DIAGNOSIS — L578 Other skin changes due to chronic exposure to nonionizing radiation: Secondary | ICD-10-CM | POA: Diagnosis not present

## 2015-04-28 DIAGNOSIS — D229 Melanocytic nevi, unspecified: Secondary | ICD-10-CM | POA: Diagnosis not present

## 2015-04-28 DIAGNOSIS — L812 Freckles: Secondary | ICD-10-CM | POA: Diagnosis not present

## 2015-07-12 ENCOUNTER — Encounter: Payer: Self-pay | Admitting: Family Medicine

## 2015-07-12 ENCOUNTER — Ambulatory Visit (INDEPENDENT_AMBULATORY_CARE_PROVIDER_SITE_OTHER): Payer: Managed Care, Other (non HMO) | Admitting: Family Medicine

## 2015-07-12 ENCOUNTER — Telehealth: Payer: Self-pay | Admitting: Family Medicine

## 2015-07-12 ENCOUNTER — Ambulatory Visit
Admission: RE | Admit: 2015-07-12 | Discharge: 2015-07-12 | Disposition: A | Discharge status: Home / Self Care | Payer: Managed Care, Other (non HMO) | Source: Ambulatory Visit | Attending: Family Medicine | Admitting: Family Medicine

## 2015-07-12 VITALS — BP 142/74 | HR 76 | Temp 98.1°F | Wt 251.8 lb

## 2015-07-12 DIAGNOSIS — M7989 Other specified soft tissue disorders: Secondary | ICD-10-CM

## 2015-07-12 DIAGNOSIS — M79604 Pain in right leg: Secondary | ICD-10-CM | POA: Diagnosis not present

## 2015-07-12 DIAGNOSIS — M79661 Pain in right lower leg: Secondary | ICD-10-CM

## 2015-07-12 DIAGNOSIS — R600 Localized edema: Secondary | ICD-10-CM | POA: Diagnosis not present

## 2015-07-12 DIAGNOSIS — I2510 Atherosclerotic heart disease of native coronary artery without angina pectoris: Secondary | ICD-10-CM

## 2015-07-12 DIAGNOSIS — M25561 Pain in right knee: Secondary | ICD-10-CM | POA: Insufficient documentation

## 2015-07-12 MED ORDER — DICLOFENAC SODIUM 1 % TD GEL: 1.0000 "application " | Freq: Three times a day (TID) | TRANSDERMAL | Status: DC

## 2015-07-12 NOTE — Telephone Encounter (Signed)
Patient notified and verbalized understanding. Insurance will probably not cover voltaren gel. Patient aware he may have to pay out of pocket. He asked that Rx be sent to [facility] instead of [facility] [address] [address]. Rx changed as requested.

## 2015-07-12 NOTE — Progress Notes (Signed)
Pre visit review using our clinic review tool, if applicable. No additional management support is needed unless otherwise documented below in the visit note. 

## 2015-07-12 NOTE — Patient Instructions (Signed)
You do have ongoing swelling of that R leg - see Casey Golden to schedule ultrasound of leg to rule out blood clot. Possibly just baker's cyst.

## 2015-07-12 NOTE — Progress Notes (Signed)
BP 142/74 mmHg  Pulse 76  Temp(Src) 98.1 F (36.7 C) (Oral)  Wt 251 lb 12 oz (114.193 kg)   CC: R leg swelling  Subjective:    Patient ID: Casey Golden, male    DOB: 01/21/1951, 65 y.o.   MRN: [id number]  HPI: [name] is a 65 y.o. male presenting on 07/12/2015 for Leg Swelling   3d h/o R leg pain and swelling posterior calf, thigh and lateral leg, yesterday red as well. Today actually feeling better. Improved with advil. Has also been elevating leg.   Denies inciting trauma/injury.   No fmhx blood clots, no personal history blood clots. Not on hormonal therapy. No long car or plane ride recently.   Denies chest pain, tightness, dyspnea.   Relevant past medical, surgical, family and social history reviewed and updated as indicated. Interim medical history since our last visit reviewed. Allergies and medications reviewed and updated. Current Outpatient Prescriptions on File Prior to Visit  Medication Sig  . albuterol (PROVENTIL HFA;VENTOLIN HFA) 108 (90 BASE) MCG/ACT inhaler Inhale 2 puffs into the lungs every 6 (six) hours as needed for wheezing.  Marland Kitchen aspirin 81 MG EC tablet Take 1 tablet (81 mg total) by mouth daily.  Marland Kitchen atenolol (TENORMIN) 25 MG tablet TAKE 1 TABLET EVERY DAY  . atorvastatin (LIPITOR) 20 MG tablet Take 20 mg by mouth every evening.   . budesonide-formoterol (SYMBICORT) 160-4.5 MCG/ACT inhaler Inhale into the lungs as needed.   . ergocalciferol (VITAMIN D2) 50000 UNITS capsule Take 1 capsule (50,000 Units total) by mouth once a week.  . fenofibrate (TRICOR) 145 MG tablet Take 1 tablet (145 mg total) by mouth every evening.  Marland Kitchen lisinopril (PRINIVIL,ZESTRIL) 10 MG tablet TAKE 1 TABLET EVERY DAY  . meclizine (ANTIVERT) 25 MG tablet Take 1 tablet (25 mg total) by mouth 3 (three) times daily as needed.  . metFORMIN (GLUCOPHAGE) 500 MG tablet Take 1 tablet (500 mg total) by mouth 2 (two) times daily with a meal.   No current facility-administered medications on  file prior to visit.    Review of Systems Per HPI unless specifically indicated in ROS section     Objective:    BP 142/74 mmHg  Pulse 76  Temp(Src) 98.1 F (36.7 C) (Oral)  Wt 251 lb 12 oz (114.193 kg)  Wt Readings from Last 3 Encounters:  07/12/15 251 lb 12 oz (114.193 kg)  03/07/15 252 lb 4 oz (114.42 kg)  02/25/15 250 lb 8 oz (113.626 kg)    Physical Exam  Constitutional: He appears well-developed and well-nourished. No distress.  Musculoskeletal: He exhibits no edema.  2+ DP bilaterally L calf circ 41.5 cm R calf circ 44 cm FROM L knee Some limited ROM R knee 2/2 pain/swelling, no evident effusion or pain to palpation of knee landmarks. No erythema or warmth of R leg present today No palpable cords.  Nursing note and vitals reviewed.  Results for orders placed or performed in visit on 03/01/15  Lipid panel  Result Value Ref Range   Cholesterol 112 0 - 200 mg/dL   Triglycerides 124.0 0.0 - 149.0 mg/dL   HDL 30.80 (L) >39.00 mg/dL   VLDL 24.8 0.0 - 40.0 mg/dL   LDL Cholesterol 57 0 - 99 mg/dL   Total CHOL/HDL Ratio 4    NonHDL 81.68   Comprehensive metabolic panel  Result Value Ref Range   Sodium 141 135 - 145 mEq/L   Potassium 4.9 3.5 - 5.1 mEq/L  Chloride 104 96 - 112 mEq/L   CO2 27 19 - 32 mEq/L   Glucose, Bld 142 (H) 70 - 99 mg/dL   BUN 19 6 - 23 mg/dL   Creatinine, Ser 1.04 0.40 - 1.50 mg/dL   Total Bilirubin 0.6 0.2 - 1.2 mg/dL   Alkaline Phosphatase 47 39 - 117 U/L   AST 46 (H) 0 - 37 U/L   ALT 58 (H) 0 - 53 U/L   Total Protein 7.4 6.0 - 8.3 g/dL   Albumin 4.2 3.5 - 5.2 g/dL   Calcium 9.7 8.4 - 10.5 mg/dL   GFR 75.75 >60.00 mL/min  Hemoglobin A1c  Result Value Ref Range   Hgb A1c MFr Bld 7.1 (H) 4.6 - 6.5 %  VITAMIN D 25 Hydroxy (Vit-D Deficiency, Fractures)  Result Value Ref Range   VITD 41.39 30.00 - 100.00 ng/mL  PSA, Medicare  Result Value Ref Range   PSA 1.82 0.10 - 4.00 ng/ml      Assessment & Plan:   Problem List Items Addressed  This Visit    Pain and swelling of right lower leg - Primary    Symptoms actually improved today however ongoing stiffness of RLE along with marked difference in calf circumference.  Will need venous US to r/o DVT, possibly baker's cyst on right.  Will send for STAT US to [facility] then call with results. Pt agrees with plan.      Relevant Orders   US Venous Img Lower Unilateral Right       Follow up plan: Return if symptoms worsen or fail to improve.  Ria Bush, MD

## 2015-07-12 NOTE — Assessment & Plan Note (Signed)
Symptoms actually improved today however ongoing stiffness of RLE along with marked difference in calf circumference.  Will need venous US to r/o DVT, possibly baker's cyst on right.  Will send for STAT US to [facility] then call with results. Pt agrees with plan.

## 2015-07-12 NOTE — Telephone Encounter (Signed)
Received call report - negative for RLE DVT. Anticipate possible baker's cyst causing pain/swelling and tightness behind R knee as discussed at office visit. Recommend he buy sleeve knee brace he can pull up for extra support, as well as rest knee, and trial tylenol 500mg  twice daily with meals for next 5 days. I have also sent topical anti inflammatory to price out at his local pharmacy for swelling behind the knee. Let us know if not improving with treatment.

## 2015-07-13 ENCOUNTER — Ambulatory Visit (INDEPENDENT_AMBULATORY_CARE_PROVIDER_SITE_OTHER): Payer: Managed Care, Other (non HMO) | Admitting: Podiatry

## 2015-07-13 ENCOUNTER — Encounter: Payer: Self-pay | Admitting: Podiatry

## 2015-07-13 ENCOUNTER — Ambulatory Visit (INDEPENDENT_AMBULATORY_CARE_PROVIDER_SITE_OTHER): Payer: Managed Care, Other (non HMO)

## 2015-07-13 ENCOUNTER — Ambulatory Visit: Payer: Managed Care, Other (non HMO)

## 2015-07-13 VITALS — BP 151/86 | HR 60 | Resp 16

## 2015-07-13 DIAGNOSIS — M79672 Pain in left foot: Secondary | ICD-10-CM

## 2015-07-13 DIAGNOSIS — B351 Tinea unguium: Secondary | ICD-10-CM

## 2015-07-13 DIAGNOSIS — E1142 Type 2 diabetes mellitus with diabetic polyneuropathy: Secondary | ICD-10-CM

## 2015-07-13 DIAGNOSIS — E119 Type 2 diabetes mellitus without complications: Secondary | ICD-10-CM

## 2015-07-13 DIAGNOSIS — M79671 Pain in right foot: Secondary | ICD-10-CM

## 2015-07-13 DIAGNOSIS — M79676 Pain in unspecified toe(s): Secondary | ICD-10-CM

## 2015-07-13 DIAGNOSIS — I2510 Atherosclerotic heart disease of native coronary artery without angina pectoris: Secondary | ICD-10-CM

## 2015-07-13 DIAGNOSIS — Z0189 Encounter for other specified special examinations: Secondary | ICD-10-CM

## 2015-07-13 MED ORDER — GABAPENTIN 100 MG PO CAPS: 100.0000 mg | ORAL_CAPSULE | Freq: Every day | ORAL | Status: DC

## 2015-07-13 NOTE — Progress Notes (Signed)
   Subjective:    Patient ID: Casey Golden, male    DOB: 1950/07/21, 65 y.o.   MRN: [id number]  HPI: He presents today with chief complaint of numb feet particularly at nighttime he has burning and numbness to deal with. He states that sometimes hard to sleep more often than not. He is also concerned about the toenails and being diabetic. He states that his last hemoglobin A1c was 7.1.    Review of Systems  Musculoskeletal: Positive for arthralgias and gait problem.  Neurological: Positive for numbness.  All other systems reviewed and are negative.      Objective:   Physical Exam: Vital signs are stable he is alert and oriented 3 in no apparent distress. Pulses are strongly palpable. Neurologic sensorium is intact per Semmes-Weinstein monofilament. Deep tendon reflexes are intact muscle strength is intact. Orthopedic evaluation x-rays all joints distal to the ankle range of motion without crepitation. Mild hammertoe deformities are noted bilateral A symptomatically. Rectus foot type bilateral. Radiographs taken do not demonstrate any type of osseus abnormalities is major. Cutaneous evaluation demonstrates supple hydrated cutis no erythema edema saline drainage or odor toenails are thick yellow dystrophic likely mycotic.        Assessment & Plan:  Assessment: Diabetic peripheral neuropathy with pain in limb secondary to onychomycosis 1 through 5 bilateral.  Plan: Started him on 100 mg gabapentin just at nighttime. Debrided his toenails 1 through 5 bilateral. Follow up with him in 2 months at which time we will review the nails once again and evaluate necessity for changing the gabapentin.

## 2015-07-27 ENCOUNTER — Ambulatory Visit (INDEPENDENT_AMBULATORY_CARE_PROVIDER_SITE_OTHER): Payer: Managed Care, Other (non HMO) | Admitting: Internal Medicine

## 2015-07-27 ENCOUNTER — Encounter: Payer: Self-pay | Admitting: Internal Medicine

## 2015-07-27 VITALS — BP 114/74 | HR 63 | Temp 98.2°F | Wt 252.0 lb

## 2015-07-27 DIAGNOSIS — M79604 Pain in right leg: Secondary | ICD-10-CM

## 2015-07-27 DIAGNOSIS — I2510 Atherosclerotic heart disease of native coronary artery without angina pectoris: Secondary | ICD-10-CM

## 2015-07-27 NOTE — Progress Notes (Signed)
Pre visit review using our clinic review tool, if applicable. No additional management support is needed unless otherwise documented below in the visit note. 

## 2015-07-27 NOTE — Assessment & Plan Note (Signed)
No DVT per ultrasound Didn't have Baker's cyst Has right hip findings but different pain with exam The radiation from thigh to calf sounds radicular--but no worrisome features If persists, may want to proceed with neurology eval

## 2015-07-27 NOTE — Progress Notes (Signed)
Subjective:    Patient ID: Casey Golden, male    DOB: 02/25/1950, 65 y.o.   MRN: [id number]  HPI Here due to ongoing pain of right leg Reviewed ultrasound and Dr Darnell Level note  Had bad cramp this morning--both thigh and calf Did resolve and feels better Noticed burning and pain on drive to [facility] yesterday Has had ongoing pain since last visit  Some swelling in right calf--persists Started in knee--still has some pain there No pain in hip  Improved after cramp Also helped by advil 400-600 tid No real pattern for time of day May be some worse with prolonged time on feet  Current Outpatient Prescriptions on File Prior to Visit  Medication Sig Dispense Refill  . albuterol (PROVENTIL HFA;VENTOLIN HFA) 108 (90 BASE) MCG/ACT inhaler Inhale 2 puffs into the lungs every 6 (six) hours as needed for wheezing. 1 Inhaler 2  . aspirin 81 MG EC tablet Take 1 tablet (81 mg total) by mouth daily. 30 tablet 12  . atenolol (TENORMIN) 25 MG tablet TAKE 1 TABLET EVERY DAY 90 tablet 3  . atorvastatin (LIPITOR) 20 MG tablet Take 20 mg by mouth every evening.     . budesonide-formoterol (SYMBICORT) 160-4.5 MCG/ACT inhaler Inhale into the lungs as needed.     . diclofenac sodium (VOLTAREN) 1 % GEL Apply 1 application topically 3 (three) times daily. 1 Tube 1  . ergocalciferol (VITAMIN D2) 50000 UNITS capsule Take 1 capsule (50,000 Units total) by mouth once a week. 12 capsule 4  . fenofibrate (TRICOR) 145 MG tablet Take 1 tablet (145 mg total) by mouth every evening. 90 tablet 3  . gabapentin (NEURONTIN) 100 MG capsule Take 1 capsule (100 mg total) by mouth at bedtime. 90 capsule 0  . lisinopril (PRINIVIL,ZESTRIL) 10 MG tablet TAKE 1 TABLET EVERY DAY 90 tablet 3  . meclizine (ANTIVERT) 25 MG tablet Take 1 tablet (25 mg total) by mouth 3 (three) times daily as needed. 90 tablet 3  . metFORMIN (GLUCOPHAGE) 500 MG tablet Take 1 tablet (500 mg total) by mouth 2 (two) times daily with a meal. 180 tablet 3    No current facility-administered medications on file prior to visit.    Allergies  Allergen Reactions  . Sulfa Antibiotics Hives    Past Medical History  Diagnosis Date  . Hyperlipidemia   . Hypertension   . Type 2 diabetes mellitus ([place])   . GERD (gastroesophageal reflux disease)   . CAD (coronary artery disease)     Cardiologist--  Dr. Ida Rogue  . Arthritis   . Rash of genital area     09-08-2014  per pt Dr Junious Silk aware  . Nocturia   . Seasonal and perennial allergic rhinitis   . OSA (obstructive sleep apnea)     per pt no cpap due to sleep center/ insurance issue-- study done 2014  . Wears glasses   . Wears dentures     full upper/  partial lower  . History of chicken pox   . Colonic polyp     Past Surgical History  Procedure Laterality Date  . Tonsillectomy  age 72  . Appendectomy  age 50  . Cholecystectomy open  2006  . Abdominal hernia repair  2007      [facility]    open repair  . Cervical fusion  1992  . Cardiac catheterization  12-23-2007   [facility]    Abnormal myoview w/ ischemia/  40% mRCA with nonobstructive and no sig. plaque in  his left system, EF 55%  . Cardiac catheterization  Apr 2008    [facility]    Abnormal myoview/  50% RCA,  ef 65%  . Cardiac catheterization  1999      [facility]  . Hydrocele excision Left 09/14/2014    Procedure: LEFT HYDROCELECTOMY ADULT;  Surgeon: Festus Aloe, MD;  Location: [facility];  Service: Urology;  Laterality: Left;  . Excision of skin tag Right 09/14/2014    Procedure: EXCISION OF SKIN TAG;  Surgeon: Festus Aloe, MD;  Location: [facility];  Service: Urology;  Laterality: Right;  . Mohs surgery  2015    skin cancer  . Dental surgery      metal dental implant L mandible    Family History  Problem Relation Age of Onset  . Coronary artery disease Neg Hx     Premature  . CAD Mother     MI  . Diabetes Mother   . Cancer Father 47    lung (smoker)  . Heart disease Paternal  Grandmother   . Heart disease Paternal Grandfather   . Cancer Sister     lung  . Stroke Mother     mini-stroke    Social History   Social History  . Marital Status: Married    Spouse Name: N/A  . Number of Children: N/A  . Years of Education: N/A   Occupational History  . Full time [employer]   Social History Main Topics  . Smoking status: Former Smoker -- 2.00 packs/day for 35 years    Types: Cigarettes    Quit date: 01/29/1990  . Smokeless tobacco: Never Used  . Alcohol Use: No  . Drug Use: No  . Sexual Activity: Not on file   Other Topics Concern  . Not on file   Social History Narrative   Lives with wife, dog and cats   Occupation: retired, was self employed, now works at [employer]   Edu: HS   Review of Systems Works on concrete 5 days per week-- [employer]    Objective:   Physical Exam  Musculoskeletal:  Right calf is larger than left--but no inflammation or tenderness Right knee exam is basically normal Sig restriction of internal rotation of right hip--causing pain (but localized at hip)  Neurological:  Normal gait now No focal weakness          Assessment & Plan:

## 2015-08-10 DIAGNOSIS — M9903 Segmental and somatic dysfunction of lumbar region: Secondary | ICD-10-CM | POA: Diagnosis not present

## 2015-08-10 DIAGNOSIS — M5137 Other intervertebral disc degeneration, lumbosacral region: Secondary | ICD-10-CM | POA: Diagnosis not present

## 2015-08-10 DIAGNOSIS — G5792 Unspecified mononeuropathy of left lower limb: Secondary | ICD-10-CM | POA: Diagnosis not present

## 2015-08-10 DIAGNOSIS — M5441 Lumbago with sciatica, right side: Secondary | ICD-10-CM | POA: Diagnosis not present

## 2015-08-10 DIAGNOSIS — M791 Myalgia: Secondary | ICD-10-CM | POA: Diagnosis not present

## 2015-08-10 DIAGNOSIS — M5136 Other intervertebral disc degeneration, lumbar region: Secondary | ICD-10-CM | POA: Diagnosis not present

## 2015-08-10 DIAGNOSIS — G5791 Unspecified mononeuropathy of right lower limb: Secondary | ICD-10-CM | POA: Diagnosis not present

## 2015-08-10 DIAGNOSIS — M9904 Segmental and somatic dysfunction of sacral region: Secondary | ICD-10-CM | POA: Diagnosis not present

## 2015-08-11 DIAGNOSIS — M9904 Segmental and somatic dysfunction of sacral region: Secondary | ICD-10-CM | POA: Diagnosis not present

## 2015-08-11 DIAGNOSIS — M5137 Other intervertebral disc degeneration, lumbosacral region: Secondary | ICD-10-CM | POA: Diagnosis not present

## 2015-08-11 DIAGNOSIS — G5792 Unspecified mononeuropathy of left lower limb: Secondary | ICD-10-CM | POA: Diagnosis not present

## 2015-08-11 DIAGNOSIS — M791 Myalgia: Secondary | ICD-10-CM | POA: Diagnosis not present

## 2015-08-11 DIAGNOSIS — G5791 Unspecified mononeuropathy of right lower limb: Secondary | ICD-10-CM | POA: Diagnosis not present

## 2015-08-11 DIAGNOSIS — M5441 Lumbago with sciatica, right side: Secondary | ICD-10-CM | POA: Diagnosis not present

## 2015-08-11 DIAGNOSIS — M9903 Segmental and somatic dysfunction of lumbar region: Secondary | ICD-10-CM | POA: Diagnosis not present

## 2015-08-11 DIAGNOSIS — M5136 Other intervertebral disc degeneration, lumbar region: Secondary | ICD-10-CM | POA: Diagnosis not present

## 2015-08-16 DIAGNOSIS — M9904 Segmental and somatic dysfunction of sacral region: Secondary | ICD-10-CM | POA: Diagnosis not present

## 2015-08-16 DIAGNOSIS — M791 Myalgia: Secondary | ICD-10-CM | POA: Diagnosis not present

## 2015-08-16 DIAGNOSIS — M9903 Segmental and somatic dysfunction of lumbar region: Secondary | ICD-10-CM | POA: Diagnosis not present

## 2015-08-16 DIAGNOSIS — M5441 Lumbago with sciatica, right side: Secondary | ICD-10-CM | POA: Diagnosis not present

## 2015-08-16 DIAGNOSIS — G5791 Unspecified mononeuropathy of right lower limb: Secondary | ICD-10-CM | POA: Diagnosis not present

## 2015-08-16 DIAGNOSIS — M5136 Other intervertebral disc degeneration, lumbar region: Secondary | ICD-10-CM | POA: Diagnosis not present

## 2015-08-16 DIAGNOSIS — M5137 Other intervertebral disc degeneration, lumbosacral region: Secondary | ICD-10-CM | POA: Diagnosis not present

## 2015-08-16 DIAGNOSIS — G5792 Unspecified mononeuropathy of left lower limb: Secondary | ICD-10-CM | POA: Diagnosis not present

## 2015-08-17 DIAGNOSIS — M9903 Segmental and somatic dysfunction of lumbar region: Secondary | ICD-10-CM | POA: Diagnosis not present

## 2015-08-17 DIAGNOSIS — M5136 Other intervertebral disc degeneration, lumbar region: Secondary | ICD-10-CM | POA: Diagnosis not present

## 2015-08-17 DIAGNOSIS — G5792 Unspecified mononeuropathy of left lower limb: Secondary | ICD-10-CM | POA: Diagnosis not present

## 2015-08-17 DIAGNOSIS — M5137 Other intervertebral disc degeneration, lumbosacral region: Secondary | ICD-10-CM | POA: Diagnosis not present

## 2015-08-17 DIAGNOSIS — G5791 Unspecified mononeuropathy of right lower limb: Secondary | ICD-10-CM | POA: Diagnosis not present

## 2015-08-17 DIAGNOSIS — M5441 Lumbago with sciatica, right side: Secondary | ICD-10-CM | POA: Diagnosis not present

## 2015-08-17 DIAGNOSIS — M791 Myalgia: Secondary | ICD-10-CM | POA: Diagnosis not present

## 2015-08-17 DIAGNOSIS — M9904 Segmental and somatic dysfunction of sacral region: Secondary | ICD-10-CM | POA: Diagnosis not present

## 2015-08-23 DIAGNOSIS — G5791 Unspecified mononeuropathy of right lower limb: Secondary | ICD-10-CM | POA: Diagnosis not present

## 2015-08-23 DIAGNOSIS — M5136 Other intervertebral disc degeneration, lumbar region: Secondary | ICD-10-CM | POA: Diagnosis not present

## 2015-08-23 DIAGNOSIS — M791 Myalgia: Secondary | ICD-10-CM | POA: Diagnosis not present

## 2015-08-23 DIAGNOSIS — M5441 Lumbago with sciatica, right side: Secondary | ICD-10-CM | POA: Diagnosis not present

## 2015-08-23 DIAGNOSIS — M9903 Segmental and somatic dysfunction of lumbar region: Secondary | ICD-10-CM | POA: Diagnosis not present

## 2015-08-23 DIAGNOSIS — M9904 Segmental and somatic dysfunction of sacral region: Secondary | ICD-10-CM | POA: Diagnosis not present

## 2015-08-23 DIAGNOSIS — M5137 Other intervertebral disc degeneration, lumbosacral region: Secondary | ICD-10-CM | POA: Diagnosis not present

## 2015-08-23 DIAGNOSIS — G5792 Unspecified mononeuropathy of left lower limb: Secondary | ICD-10-CM | POA: Diagnosis not present

## 2015-08-24 DIAGNOSIS — M9903 Segmental and somatic dysfunction of lumbar region: Secondary | ICD-10-CM | POA: Diagnosis not present

## 2015-08-24 DIAGNOSIS — G5791 Unspecified mononeuropathy of right lower limb: Secondary | ICD-10-CM | POA: Diagnosis not present

## 2015-08-24 DIAGNOSIS — G5792 Unspecified mononeuropathy of left lower limb: Secondary | ICD-10-CM | POA: Diagnosis not present

## 2015-08-24 DIAGNOSIS — M5137 Other intervertebral disc degeneration, lumbosacral region: Secondary | ICD-10-CM | POA: Diagnosis not present

## 2015-08-24 DIAGNOSIS — M791 Myalgia: Secondary | ICD-10-CM | POA: Diagnosis not present

## 2015-08-24 DIAGNOSIS — M9904 Segmental and somatic dysfunction of sacral region: Secondary | ICD-10-CM | POA: Diagnosis not present

## 2015-08-24 DIAGNOSIS — M5441 Lumbago with sciatica, right side: Secondary | ICD-10-CM | POA: Diagnosis not present

## 2015-08-24 DIAGNOSIS — M5136 Other intervertebral disc degeneration, lumbar region: Secondary | ICD-10-CM | POA: Diagnosis not present

## 2015-08-30 DIAGNOSIS — M9903 Segmental and somatic dysfunction of lumbar region: Secondary | ICD-10-CM | POA: Diagnosis not present

## 2015-08-30 DIAGNOSIS — M5137 Other intervertebral disc degeneration, lumbosacral region: Secondary | ICD-10-CM | POA: Diagnosis not present

## 2015-08-30 DIAGNOSIS — M5136 Other intervertebral disc degeneration, lumbar region: Secondary | ICD-10-CM | POA: Diagnosis not present

## 2015-08-30 DIAGNOSIS — M5441 Lumbago with sciatica, right side: Secondary | ICD-10-CM | POA: Diagnosis not present

## 2015-08-30 DIAGNOSIS — G5792 Unspecified mononeuropathy of left lower limb: Secondary | ICD-10-CM | POA: Diagnosis not present

## 2015-08-30 DIAGNOSIS — M791 Myalgia: Secondary | ICD-10-CM | POA: Diagnosis not present

## 2015-08-30 DIAGNOSIS — G5791 Unspecified mononeuropathy of right lower limb: Secondary | ICD-10-CM | POA: Diagnosis not present

## 2015-08-30 DIAGNOSIS — M9904 Segmental and somatic dysfunction of sacral region: Secondary | ICD-10-CM | POA: Diagnosis not present

## 2015-08-31 DIAGNOSIS — M5441 Lumbago with sciatica, right side: Secondary | ICD-10-CM | POA: Diagnosis not present

## 2015-08-31 DIAGNOSIS — M9903 Segmental and somatic dysfunction of lumbar region: Secondary | ICD-10-CM | POA: Diagnosis not present

## 2015-08-31 DIAGNOSIS — M5137 Other intervertebral disc degeneration, lumbosacral region: Secondary | ICD-10-CM | POA: Diagnosis not present

## 2015-08-31 DIAGNOSIS — M791 Myalgia: Secondary | ICD-10-CM | POA: Diagnosis not present

## 2015-08-31 DIAGNOSIS — G5791 Unspecified mononeuropathy of right lower limb: Secondary | ICD-10-CM | POA: Diagnosis not present

## 2015-08-31 DIAGNOSIS — G5792 Unspecified mononeuropathy of left lower limb: Secondary | ICD-10-CM | POA: Diagnosis not present

## 2015-08-31 DIAGNOSIS — M9904 Segmental and somatic dysfunction of sacral region: Secondary | ICD-10-CM | POA: Diagnosis not present

## 2015-08-31 DIAGNOSIS — M5136 Other intervertebral disc degeneration, lumbar region: Secondary | ICD-10-CM | POA: Diagnosis not present

## 2015-09-02 ENCOUNTER — Other Ambulatory Visit: Payer: Self-pay | Admitting: Cardiovascular Disease

## 2015-09-05 ENCOUNTER — Encounter: Payer: Self-pay | Admitting: Family Medicine

## 2015-09-05 ENCOUNTER — Ambulatory Visit (INDEPENDENT_AMBULATORY_CARE_PROVIDER_SITE_OTHER): Payer: Managed Care, Other (non HMO) | Admitting: Family Medicine

## 2015-09-05 VITALS — BP 140/78 | HR 72 | Temp 98.1°F | Wt 255.2 lb

## 2015-09-05 DIAGNOSIS — I2510 Atherosclerotic heart disease of native coronary artery without angina pectoris: Secondary | ICD-10-CM

## 2015-09-05 DIAGNOSIS — M79661 Pain in right lower leg: Secondary | ICD-10-CM

## 2015-09-05 DIAGNOSIS — E1140 Type 2 diabetes mellitus with diabetic neuropathy, unspecified: Secondary | ICD-10-CM

## 2015-09-05 DIAGNOSIS — I10 Essential (primary) hypertension: Secondary | ICD-10-CM | POA: Diagnosis not present

## 2015-09-05 DIAGNOSIS — E1142 Type 2 diabetes mellitus with diabetic polyneuropathy: Secondary | ICD-10-CM

## 2015-09-05 DIAGNOSIS — R7401 Elevation of levels of liver transaminase levels: Secondary | ICD-10-CM

## 2015-09-05 DIAGNOSIS — Z23 Encounter for immunization: Secondary | ICD-10-CM

## 2015-09-05 DIAGNOSIS — IMO0002 Reserved for concepts with insufficient information to code with codable children: Secondary | ICD-10-CM

## 2015-09-05 DIAGNOSIS — M79604 Pain in right leg: Secondary | ICD-10-CM

## 2015-09-05 DIAGNOSIS — E1165 Type 2 diabetes mellitus with hyperglycemia: Secondary | ICD-10-CM | POA: Diagnosis not present

## 2015-09-05 DIAGNOSIS — E785 Hyperlipidemia, unspecified: Secondary | ICD-10-CM

## 2015-09-05 DIAGNOSIS — E1169 Type 2 diabetes mellitus with other specified complication: Secondary | ICD-10-CM | POA: Diagnosis not present

## 2015-09-05 DIAGNOSIS — M7989 Other specified soft tissue disorders: Secondary | ICD-10-CM

## 2015-09-05 DIAGNOSIS — R740 Nonspecific elevation of levels of transaminase and lactic acid dehydrogenase [LDH]: Secondary | ICD-10-CM

## 2015-09-05 LAB — COMPREHENSIVE METABOLIC PANEL
ALT: 57 U/L — ABNORMAL HIGH (ref 0–53)
AST: 52 U/L — ABNORMAL HIGH (ref 0–37)
Albumin: 4.1 g/dL (ref 3.5–5.2)
Alkaline Phosphatase: 43 U/L (ref 39–117)
BUN: 19 mg/dL (ref 6–23)
CO2: 31 mEq/L (ref 19–32)
Calcium: 9.7 mg/dL (ref 8.4–10.5)
Chloride: 102 mEq/L (ref 96–112)
Creatinine, Ser: 0.92 mg/dL (ref 0.40–1.50)
GFR: 87.13 mL/min (ref >60.00)
Glucose, Bld: 158 mg/dL — ABNORMAL HIGH (ref 70–99)
Potassium: 4.8 mEq/L (ref 3.5–5.1)
Sodium: 138 mEq/L (ref 135–145)
Total Bilirubin: 0.6 mg/dL (ref 0.2–1.2)
Total Protein: 7.1 g/dL (ref 6.0–8.3)

## 2015-09-05 LAB — HEMOGLOBIN A1C: Hgb A1c MFr Bld: 7.5 % — ABNORMAL HIGH (ref 4.6–6.5)

## 2015-09-05 LAB — VITAMIN B12: Vitamin B-12: 378 pg/mL (ref 211–911)

## 2015-09-05 NOTE — Assessment & Plan Note (Addendum)
Chronic. Check labs today. Foot exam today. UTD eye exam. Discussed pneumococcal vaccine - prevnar today.  Endorses mild diabetic neuropathy symptoms today, and noted diminished sensation to monofilament testing.

## 2015-09-05 NOTE — Addendum Note (Signed)
Addended by: Royann Shivers A on: 09/05/2015 09:26 AM   Modules accepted: Orders

## 2015-09-05 NOTE — Patient Instructions (Addendum)
Prevnar today.  Labs today.  Good to see you today, call us with questions. Return as needed or in 6 months for physical.

## 2015-09-05 NOTE — Assessment & Plan Note (Signed)
Chronic, stable. Continue current regimen. 

## 2015-09-05 NOTE — Assessment & Plan Note (Signed)
Check LFTs 

## 2015-09-05 NOTE — Progress Notes (Signed)
Pre visit review using our clinic review tool, if applicable. No additional management support is needed unless otherwise documented below in the visit note. 

## 2015-09-05 NOTE — Assessment & Plan Note (Signed)
Chronic, stable. Continue fenofibrate and statin.

## 2015-09-05 NOTE — Progress Notes (Signed)
BP 140/78   Pulse 72   Temp 98.1 F (36.7 C) (Oral)   Wt 255 lb 4 oz (115.8 kg)   BMI 34.62 kg/m    CC: 6 mo f/u visit Subjective:    Patient ID: Casey Golden, male    DOB: [birth year], 65 y.o.   MRN: [id number]  HPI: Casey Golden is a 65 y.o. male presenting on 09/05/2015 for Follow-up (discuss changing atenolol to bystolic due to fatigue)   R leg pain/swelling - s/p normal dopplers. He saw chiropractor who helped his back pain. Ongoing R leg swelling.   DM - regularly does not check sugars. Compliant with antihyperglycemic regimen which includes: metformin 500mg  bid. Denies hypoglycemic symptoms. Denies paresthesias. Last diabetic eye exam 09/2014.  Pneumovax: received by Dr Jacqualine Code 2013. Prevnar: today. Foot exam today. Less walking recently.  Lab Results  Component Value Date   HGBA1C 7.1 (H) 03/01/2015   Diabetic Foot Exam - Simple   Simple Foot Form Diabetic Foot exam was performed with the following findings:  Yes 09/05/2015  8:56 AM  Visual Inspection No deformities, no ulcerations, no other skin breakdown bilaterally:  Yes Sensation Testing See comments:  Yes Pulse Check Posterior Tibialis and Dorsalis pulse intact bilaterally:  Yes Comments Decreased testing to monofilament     HLD - compliant with atorvastatin and tricor without myalgias. Noticing increasing fatigue which he attributes to chol medications.   HTN - Compliant with current antihypertensive regimen of atenolol 25mg  daily, lisinopril 10mg  daily. Does not check blood pressures at home. No low blood pressure readings or symptoms of dizziness/syncope. Denies HA, vision changes, CP/tightness, SOB.   Relevant past medical, surgical, family and social history reviewed and updated as indicated. Interim medical history since our last visit reviewed. Allergies and medications reviewed and updated. Current Outpatient Prescriptions on File Prior to Visit  Medication Sig  . aspirin 81 MG EC tablet Take 1 tablet  (81 mg total) by mouth daily.  Marland Kitchen atenolol (TENORMIN) 25 MG tablet TAKE 1 TABLET BY MOUTH EVERY DAY  . atorvastatin (LIPITOR) 20 MG tablet Take 20 mg by mouth every evening.   . diclofenac sodium (VOLTAREN) 1 % GEL Apply 1 application topically 3 (three) times daily.  . ergocalciferol (VITAMIN D2) 50000 UNITS capsule Take 1 capsule (50,000 Units total) by mouth once a week.  . fenofibrate (TRICOR) 145 MG tablet Take 1 tablet (145 mg total) by mouth every evening.  . gabapentin (NEURONTIN) 100 MG capsule Take 1 capsule (100 mg total) by mouth at bedtime.  Marland Kitchen ibuprofen (ADVIL,MOTRIN) 200 MG tablet Take 200 mg by mouth every 6 (six) hours as needed.  Marland Kitchen lisinopril (PRINIVIL,ZESTRIL) 10 MG tablet TAKE 1 TABLET EVERY DAY  . meclizine (ANTIVERT) 25 MG tablet Take 1 tablet (25 mg total) by mouth 3 (three) times daily as needed.  . metFORMIN (GLUCOPHAGE) 500 MG tablet Take 1 tablet (500 mg total) by mouth 2 (two) times daily with a meal.  . albuterol (PROVENTIL HFA;VENTOLIN HFA) 108 (90 BASE) MCG/ACT inhaler Inhale 2 puffs into the lungs every 6 (six) hours as needed for wheezing. (Patient not taking: Reported on 09/05/2015)  . budesonide-formoterol (SYMBICORT) 160-4.5 MCG/ACT inhaler Inhale into the lungs as needed.    No current facility-administered medications on file prior to visit.     Review of Systems Per HPI unless specifically indicated in ROS section     Objective:    BP 140/78   Pulse 72   Temp 98.1 F (36.7  C) (Oral)   Wt 255 lb 4 oz (115.8 kg)   BMI 34.62 kg/m   Wt Readings from Last 3 Encounters:  09/05/15 255 lb 4 oz (115.8 kg)  07/27/15 252 lb (114.3 kg)  07/12/15 251 lb 12 oz (114.2 kg)    Physical Exam  Constitutional: He appears well-developed and well-nourished. No distress.  HENT:  Head: Normocephalic and atraumatic.  Right Ear: External ear normal.  Left Ear: External ear normal.  Nose: Nose normal.  Mouth/Throat: Oropharynx is clear and moist. No oropharyngeal  exudate.  Eyes: Conjunctivae and EOM are normal. Pupils are equal, round, and reactive to light. No scleral icterus.  Neck: Normal range of motion. Neck supple.  Cardiovascular: Normal rate, regular rhythm, normal heart sounds and intact distal pulses.   No murmur heard. Pulmonary/Chest: Effort normal and breath sounds normal. No respiratory distress. He has no wheezes. He has no rales.  Musculoskeletal: He exhibits no edema.  See HPI for foot exam if done  Lymphadenopathy:    He has no cervical adenopathy.  Skin: Skin is warm and dry. No rash noted.  Psychiatric: He has a normal mood and affect.  Nursing note and vitals reviewed.  Results for orders placed or performed in visit on 03/01/15  Hepatitis C antibody  Result Value Ref Range   HCV Ab NEGATIVE NEGATIVE   Lab Results  Component Value Date   CHOL 112 03/01/2015   HDL 30.80 (L) 03/01/2015   LDLCALC 57 03/01/2015   TRIG 124.0 03/01/2015   CHOLHDL 4 03/01/2015       Assessment & Plan:   Problem List Items Addressed This Visit    Dyslipidemia associated with type 2 diabetes mellitus (HCC)    Chronic, stable. Continue fenofibrate and statin.       Relevant Orders   Comprehensive metabolic panel   HTN (hypertension)    Chronic, stable. Continue current regimen.       Relevant Orders   Comprehensive metabolic panel   Pain and swelling of right lower leg    Improved, mild swelling persists      Transaminitis    Check LFTs      Uncontrolled type 2 diabetes with neuropathy ([place]) - Primary    Chronic. Check labs today. Foot exam today. UTD eye exam. Discussed pneumococcal vaccine - prevnar today.  Endorses mild diabetic neuropathy symptoms today, and noted diminished sensation to monofilament testing.      Relevant Orders   Hemoglobin A1c   Comprehensive metabolic panel   Vitamin [id number]    Other Visit Diagnoses    Diabetic polyneuropathy associated with type 2 diabetes mellitus ([place])       Relevant Orders    Vitamin B12       Follow up plan: Return in about 6 months (around 03/07/2016), or as needed, for annual exam, prior fasting for blood work.  Casey Bush, MD

## 2015-09-05 NOTE — Assessment & Plan Note (Signed)
Improved, mild swelling persists

## 2015-09-06 ENCOUNTER — Other Ambulatory Visit: Payer: Self-pay | Admitting: Cardiovascular Disease

## 2015-09-06 DIAGNOSIS — M9903 Segmental and somatic dysfunction of lumbar region: Secondary | ICD-10-CM | POA: Diagnosis not present

## 2015-09-06 DIAGNOSIS — M5441 Lumbago with sciatica, right side: Secondary | ICD-10-CM | POA: Diagnosis not present

## 2015-09-06 DIAGNOSIS — M5137 Other intervertebral disc degeneration, lumbosacral region: Secondary | ICD-10-CM | POA: Diagnosis not present

## 2015-09-06 DIAGNOSIS — G5792 Unspecified mononeuropathy of left lower limb: Secondary | ICD-10-CM | POA: Diagnosis not present

## 2015-09-06 DIAGNOSIS — M791 Myalgia: Secondary | ICD-10-CM | POA: Diagnosis not present

## 2015-09-06 DIAGNOSIS — G5791 Unspecified mononeuropathy of right lower limb: Secondary | ICD-10-CM | POA: Diagnosis not present

## 2015-09-06 DIAGNOSIS — M5136 Other intervertebral disc degeneration, lumbar region: Secondary | ICD-10-CM | POA: Diagnosis not present

## 2015-09-06 DIAGNOSIS — M9904 Segmental and somatic dysfunction of sacral region: Secondary | ICD-10-CM | POA: Diagnosis not present

## 2015-09-07 DIAGNOSIS — G5791 Unspecified mononeuropathy of right lower limb: Secondary | ICD-10-CM | POA: Diagnosis not present

## 2015-09-07 DIAGNOSIS — G5792 Unspecified mononeuropathy of left lower limb: Secondary | ICD-10-CM | POA: Diagnosis not present

## 2015-09-07 DIAGNOSIS — M9903 Segmental and somatic dysfunction of lumbar region: Secondary | ICD-10-CM | POA: Diagnosis not present

## 2015-09-07 DIAGNOSIS — M5136 Other intervertebral disc degeneration, lumbar region: Secondary | ICD-10-CM | POA: Diagnosis not present

## 2015-09-07 DIAGNOSIS — M9904 Segmental and somatic dysfunction of sacral region: Secondary | ICD-10-CM | POA: Diagnosis not present

## 2015-09-07 DIAGNOSIS — M5441 Lumbago with sciatica, right side: Secondary | ICD-10-CM | POA: Diagnosis not present

## 2015-09-07 DIAGNOSIS — M5137 Other intervertebral disc degeneration, lumbosacral region: Secondary | ICD-10-CM | POA: Diagnosis not present

## 2015-09-07 DIAGNOSIS — M791 Myalgia: Secondary | ICD-10-CM | POA: Diagnosis not present

## 2015-09-08 DIAGNOSIS — M9903 Segmental and somatic dysfunction of lumbar region: Secondary | ICD-10-CM | POA: Diagnosis not present

## 2015-09-08 DIAGNOSIS — M9904 Segmental and somatic dysfunction of sacral region: Secondary | ICD-10-CM | POA: Diagnosis not present

## 2015-09-08 DIAGNOSIS — M5136 Other intervertebral disc degeneration, lumbar region: Secondary | ICD-10-CM | POA: Diagnosis not present

## 2015-09-08 DIAGNOSIS — M5441 Lumbago with sciatica, right side: Secondary | ICD-10-CM | POA: Diagnosis not present

## 2015-09-08 DIAGNOSIS — M791 Myalgia: Secondary | ICD-10-CM | POA: Diagnosis not present

## 2015-09-08 DIAGNOSIS — G5791 Unspecified mononeuropathy of right lower limb: Secondary | ICD-10-CM | POA: Diagnosis not present

## 2015-09-08 DIAGNOSIS — M5137 Other intervertebral disc degeneration, lumbosacral region: Secondary | ICD-10-CM | POA: Diagnosis not present

## 2015-09-08 DIAGNOSIS — G5792 Unspecified mononeuropathy of left lower limb: Secondary | ICD-10-CM | POA: Diagnosis not present

## 2015-09-10 ENCOUNTER — Other Ambulatory Visit: Payer: Self-pay | Admitting: Family Medicine

## 2015-09-10 MED ORDER — METFORMIN HCL 500 MG PO TABS: ORAL_TABLET | ORAL | 3 refills | Status: DC

## 2015-09-12 DIAGNOSIS — G5792 Unspecified mononeuropathy of left lower limb: Secondary | ICD-10-CM | POA: Diagnosis not present

## 2015-09-12 DIAGNOSIS — M5441 Lumbago with sciatica, right side: Secondary | ICD-10-CM | POA: Diagnosis not present

## 2015-09-12 DIAGNOSIS — M5136 Other intervertebral disc degeneration, lumbar region: Secondary | ICD-10-CM | POA: Diagnosis not present

## 2015-09-12 DIAGNOSIS — M9904 Segmental and somatic dysfunction of sacral region: Secondary | ICD-10-CM | POA: Diagnosis not present

## 2015-09-12 DIAGNOSIS — M5137 Other intervertebral disc degeneration, lumbosacral region: Secondary | ICD-10-CM | POA: Diagnosis not present

## 2015-09-12 DIAGNOSIS — M791 Myalgia: Secondary | ICD-10-CM | POA: Diagnosis not present

## 2015-09-12 DIAGNOSIS — G5791 Unspecified mononeuropathy of right lower limb: Secondary | ICD-10-CM | POA: Diagnosis not present

## 2015-09-12 DIAGNOSIS — M9903 Segmental and somatic dysfunction of lumbar region: Secondary | ICD-10-CM | POA: Diagnosis not present

## 2015-09-13 ENCOUNTER — Other Ambulatory Visit: Payer: Self-pay | Admitting: Cardiovascular Disease

## 2015-09-13 ENCOUNTER — Telehealth: Payer: Self-pay

## 2015-09-13 DIAGNOSIS — M5136 Other intervertebral disc degeneration, lumbar region: Secondary | ICD-10-CM | POA: Diagnosis not present

## 2015-09-13 DIAGNOSIS — M791 Myalgia: Secondary | ICD-10-CM | POA: Diagnosis not present

## 2015-09-13 DIAGNOSIS — M9904 Segmental and somatic dysfunction of sacral region: Secondary | ICD-10-CM | POA: Diagnosis not present

## 2015-09-13 DIAGNOSIS — M9903 Segmental and somatic dysfunction of lumbar region: Secondary | ICD-10-CM | POA: Diagnosis not present

## 2015-09-13 DIAGNOSIS — G5791 Unspecified mononeuropathy of right lower limb: Secondary | ICD-10-CM | POA: Diagnosis not present

## 2015-09-13 DIAGNOSIS — G5792 Unspecified mononeuropathy of left lower limb: Secondary | ICD-10-CM | POA: Diagnosis not present

## 2015-09-13 DIAGNOSIS — M5137 Other intervertebral disc degeneration, lumbosacral region: Secondary | ICD-10-CM | POA: Diagnosis not present

## 2015-09-13 DIAGNOSIS — M5441 Lumbago with sciatica, right side: Secondary | ICD-10-CM | POA: Diagnosis not present

## 2015-09-13 NOTE — Telephone Encounter (Signed)
Pt left v/m has question about recent labs. Left v/m requesting pt to cb.

## 2015-09-19 ENCOUNTER — Ambulatory Visit: Payer: Managed Care, Other (non HMO) | Admitting: Podiatry

## 2015-09-19 ENCOUNTER — Telehealth: Payer: Self-pay

## 2015-09-19 DIAGNOSIS — M79661 Pain in right lower leg: Secondary | ICD-10-CM

## 2015-09-19 DIAGNOSIS — M7989 Other specified soft tissue disorders: Principal | ICD-10-CM

## 2015-09-19 DIAGNOSIS — M9903 Segmental and somatic dysfunction of lumbar region: Secondary | ICD-10-CM | POA: Diagnosis not present

## 2015-09-19 DIAGNOSIS — G5791 Unspecified mononeuropathy of right lower limb: Secondary | ICD-10-CM | POA: Diagnosis not present

## 2015-09-19 DIAGNOSIS — M5136 Other intervertebral disc degeneration, lumbar region: Secondary | ICD-10-CM | POA: Diagnosis not present

## 2015-09-19 DIAGNOSIS — G5792 Unspecified mononeuropathy of left lower limb: Secondary | ICD-10-CM | POA: Diagnosis not present

## 2015-09-19 DIAGNOSIS — M791 Myalgia: Secondary | ICD-10-CM | POA: Diagnosis not present

## 2015-09-19 DIAGNOSIS — M9904 Segmental and somatic dysfunction of sacral region: Secondary | ICD-10-CM | POA: Diagnosis not present

## 2015-09-19 DIAGNOSIS — M5441 Lumbago with sciatica, right side: Secondary | ICD-10-CM | POA: Diagnosis not present

## 2015-09-19 DIAGNOSIS — K760 Fatty (change of) liver, not elsewhere classified: Secondary | ICD-10-CM

## 2015-09-19 DIAGNOSIS — M5137 Other intervertebral disc degeneration, lumbosacral region: Secondary | ICD-10-CM | POA: Diagnosis not present

## 2015-09-19 NOTE — Telephone Encounter (Signed)
Pt left /vm; pt last seen 09/05/15 and due to leg pain discussed possible CT of hip. Pt wonders if could get CT done.Pt request cb

## 2015-09-20 DIAGNOSIS — M9903 Segmental and somatic dysfunction of lumbar region: Secondary | ICD-10-CM | POA: Diagnosis not present

## 2015-09-20 DIAGNOSIS — M9904 Segmental and somatic dysfunction of sacral region: Secondary | ICD-10-CM | POA: Diagnosis not present

## 2015-09-20 DIAGNOSIS — M791 Myalgia: Secondary | ICD-10-CM | POA: Diagnosis not present

## 2015-09-20 DIAGNOSIS — M5441 Lumbago with sciatica, right side: Secondary | ICD-10-CM | POA: Diagnosis not present

## 2015-09-20 DIAGNOSIS — G5791 Unspecified mononeuropathy of right lower limb: Secondary | ICD-10-CM | POA: Diagnosis not present

## 2015-09-20 DIAGNOSIS — M5136 Other intervertebral disc degeneration, lumbar region: Secondary | ICD-10-CM | POA: Diagnosis not present

## 2015-09-20 DIAGNOSIS — M5137 Other intervertebral disc degeneration, lumbosacral region: Secondary | ICD-10-CM | POA: Diagnosis not present

## 2015-09-20 DIAGNOSIS — G5792 Unspecified mononeuropathy of left lower limb: Secondary | ICD-10-CM | POA: Diagnosis not present

## 2015-09-21 ENCOUNTER — Ambulatory Visit (INDEPENDENT_AMBULATORY_CARE_PROVIDER_SITE_OTHER): Payer: Managed Care, Other (non HMO) | Admitting: Podiatry

## 2015-09-21 ENCOUNTER — Encounter: Payer: Self-pay | Admitting: Podiatry

## 2015-09-21 DIAGNOSIS — B351 Tinea unguium: Secondary | ICD-10-CM

## 2015-09-21 DIAGNOSIS — I2510 Atherosclerotic heart disease of native coronary artery without angina pectoris: Secondary | ICD-10-CM

## 2015-09-21 DIAGNOSIS — E1142 Type 2 diabetes mellitus with diabetic polyneuropathy: Secondary | ICD-10-CM | POA: Diagnosis not present

## 2015-09-21 DIAGNOSIS — M79676 Pain in unspecified toe(s): Secondary | ICD-10-CM | POA: Diagnosis not present

## 2015-09-21 MED ORDER — GABAPENTIN 300 MG PO CAPS: ORAL_CAPSULE | ORAL | 3 refills | Status: DC

## 2015-09-21 NOTE — Progress Notes (Signed)
He presents today for routine toenail debridement. He's also following up for his diabetic peripheral neuropathy. He states that the gabapentin may be helping a little bit at nighttime but not much.  Objective: Vital signs are stable alert and oriented 3. Toenails are thick yellow dystrophic with mycotic painful palpation as well as debridement. All other physical exams are unchanged.  Assessment: Diabetic peripheral neuropathy. Phalen signal onychomycosis.  Plan: We are going to increase his gabapentin 300 mg twice daily and I debrided nails 1 through 5 bilateral. I will follow-up with him and his nails 4 and 3 months in 1 month for med check.

## 2015-09-27 DIAGNOSIS — M9904 Segmental and somatic dysfunction of sacral region: Secondary | ICD-10-CM | POA: Diagnosis not present

## 2015-09-27 DIAGNOSIS — M9903 Segmental and somatic dysfunction of lumbar region: Secondary | ICD-10-CM | POA: Diagnosis not present

## 2015-09-27 DIAGNOSIS — M5136 Other intervertebral disc degeneration, lumbar region: Secondary | ICD-10-CM | POA: Diagnosis not present

## 2015-09-27 DIAGNOSIS — G5791 Unspecified mononeuropathy of right lower limb: Secondary | ICD-10-CM | POA: Diagnosis not present

## 2015-09-27 DIAGNOSIS — M5137 Other intervertebral disc degeneration, lumbosacral region: Secondary | ICD-10-CM | POA: Diagnosis not present

## 2015-09-27 DIAGNOSIS — M791 Myalgia: Secondary | ICD-10-CM | POA: Diagnosis not present

## 2015-09-27 DIAGNOSIS — M5441 Lumbago with sciatica, right side: Secondary | ICD-10-CM | POA: Diagnosis not present

## 2015-09-27 DIAGNOSIS — G5792 Unspecified mononeuropathy of left lower limb: Secondary | ICD-10-CM | POA: Diagnosis not present

## 2015-09-28 DIAGNOSIS — M9904 Segmental and somatic dysfunction of sacral region: Secondary | ICD-10-CM | POA: Diagnosis not present

## 2015-09-28 DIAGNOSIS — M5136 Other intervertebral disc degeneration, lumbar region: Secondary | ICD-10-CM | POA: Diagnosis not present

## 2015-09-28 DIAGNOSIS — M791 Myalgia: Secondary | ICD-10-CM | POA: Diagnosis not present

## 2015-09-28 DIAGNOSIS — M9903 Segmental and somatic dysfunction of lumbar region: Secondary | ICD-10-CM | POA: Diagnosis not present

## 2015-09-28 DIAGNOSIS — G5792 Unspecified mononeuropathy of left lower limb: Secondary | ICD-10-CM | POA: Diagnosis not present

## 2015-09-28 DIAGNOSIS — M5441 Lumbago with sciatica, right side: Secondary | ICD-10-CM | POA: Diagnosis not present

## 2015-09-28 DIAGNOSIS — G5791 Unspecified mononeuropathy of right lower limb: Secondary | ICD-10-CM | POA: Diagnosis not present

## 2015-09-28 DIAGNOSIS — M5137 Other intervertebral disc degeneration, lumbosacral region: Secondary | ICD-10-CM | POA: Diagnosis not present

## 2015-09-29 NOTE — Telephone Encounter (Signed)
Tried to call pt to get above questions answered. No answer on home or cell.  Asked him to call us back for more information. May want to re evaluate prior to imaging study. Could place in 12:45 slot tomorrow.

## 2015-09-29 NOTE — Telephone Encounter (Signed)
Is he having ongoing leg pain? Or just leg swelling? Will order pelvic CT to r/o pelvic venous obstruction as cause of unilateral leg swelling.

## 2015-09-29 NOTE — Telephone Encounter (Signed)
Appt made at 12:45, CT cancelled.

## 2015-09-29 NOTE — Telephone Encounter (Signed)
Spoke with patient. plz cancel CT tomorrow. plz schedule at 12:45pm for office visit tomorrow with me for eval R leg pain.

## 2015-09-30 ENCOUNTER — Encounter: Payer: Self-pay | Admitting: Family Medicine

## 2015-09-30 ENCOUNTER — Ambulatory Visit (INDEPENDENT_AMBULATORY_CARE_PROVIDER_SITE_OTHER): Payer: Managed Care, Other (non HMO) | Admitting: Family Medicine

## 2015-09-30 ENCOUNTER — Inpatient Hospital Stay: Admission: RE | Admit: 2015-09-30 | Payer: Managed Care, Other (non HMO) | Source: Ambulatory Visit

## 2015-09-30 VITALS — BP 128/80 | HR 76 | Temp 98.0°F | Wt 249.0 lb

## 2015-09-30 DIAGNOSIS — Z23 Encounter for immunization: Secondary | ICD-10-CM

## 2015-09-30 DIAGNOSIS — M25561 Pain in right knee: Secondary | ICD-10-CM | POA: Diagnosis not present

## 2015-09-30 DIAGNOSIS — I2510 Atherosclerotic heart disease of native coronary artery without angina pectoris: Secondary | ICD-10-CM

## 2015-09-30 MED ORDER — TRAMADOL HCL 50 MG PO TABS: 25.0000 mg | ORAL_TABLET | Freq: Two times a day (BID) | ORAL | 0 refills | Status: DC | PRN

## 2015-09-30 NOTE — Progress Notes (Signed)
Pre visit review using our clinic review tool, if applicable. No additional management support is needed unless otherwise documented below in the visit note. 

## 2015-09-30 NOTE — Assessment & Plan Note (Addendum)
Calf swelling largely resolved.  Exam today most consistent with knee etiology and not radiculopathy, sciatica, or leg swelling - anticipate LCL strain and degenerative meniscal injury. rec knee sleeve, will phone in tramadol with sedation precautions (ibuprofen ineffective) and rec ice, elevation, rest.  If not better, advised pt to return for steroid injection or eval with Dr Lorelei Pont. Pt agrees with plan. No need or further imaging at this time.

## 2015-09-30 NOTE — Patient Instructions (Addendum)
I think your pain is coming from your right knee - ligament sprain and meniscal injury.  Find larger knee sleeve for support, elevate leg, ice to knee.  Do exercises provided today.  If no better, let me know for steroid shot or call to schedule appointment with our sports medicine doctor Dr Lorelei Pont.

## 2015-09-30 NOTE — Addendum Note (Signed)
Addended by: Pilar Grammes on: 09/30/2015 02:00 PM   Modules accepted: Orders

## 2015-09-30 NOTE — Progress Notes (Signed)
BP 128/80 (BP Location: Left Arm, Patient Position: Sitting, Cuff Size: Large)   Pulse 76   Temp 98 F (36.7 C) (Oral)   Wt 249 lb (112.9 kg)   SpO2 97%   BMI 33.77 kg/m    CC: discuss back pain/R leg pain  Subjective:    Patient ID: Casey Golden, male    DOB: 25-Dec-1950, 65 y.o.   MRN: [id number]  HPI: Casey Golden is a 65 y.o. male presenting on 09/30/2015 for Leg Swelling   Ongoing lower back pain and R leg pain over last 6-9 months. Describes lower back pain with radiation down lateral leg to mid calf as well as posterior thigh and popliteal pain. Some knee pain off and on as well. Venous US was negative for DVT 06/2015. Worse pain with prolonged sitting (esp in car) or standing. R leg tingling present but no numbness/weakness. Some chronic R leg swelling over last 6-9 months as well.   No bowel/bladder accidents present. No fevers/chills. No saddle anesthesia.  Treating with advil 600mg  at a time (caution with fatty liver). Has also tried voltaren gel and icy hot.   Chiropractor helped back pain. Told had degenerative discs of lumbar spine. Leg pain persists.   Sees podiatrist for diabetic neuropathy - on 300mg  bid.   Relevant past medical, surgical, family and social history reviewed and updated as indicated. Interim medical history since our last visit reviewed. Allergies and medications reviewed and updated. Current Outpatient Prescriptions on File Prior to Visit  Medication Sig  . albuterol (PROVENTIL HFA;VENTOLIN HFA) 108 (90 BASE) MCG/ACT inhaler Inhale 2 puffs into the lungs every 6 (six) hours as needed for wheezing.  Marland Kitchen aspirin 81 MG EC tablet Take 1 tablet (81 mg total) by mouth daily.  Marland Kitchen atenolol (TENORMIN) 25 MG tablet TAKE 1 TABLET BY MOUTH EVERY DAY  . atorvastatin (LIPITOR) 20 MG tablet Take 20 mg by mouth every evening.   . budesonide-formoterol (SYMBICORT) 160-4.5 MCG/ACT inhaler Inhale into the lungs as needed.   . diclofenac sodium (VOLTAREN) 1 % GEL Apply 1  application topically 3 (three) times daily.  . ergocalciferol (VITAMIN D2) 50000 UNITS capsule Take 1 capsule (50,000 Units total) by mouth once a week.  . fenofibrate (TRICOR) 145 MG tablet TAKE 1 TABLET (145 MG TOTAL) BY MOUTH EVERY EVENING.  Marland Kitchen gabapentin (NEURONTIN) 300 MG capsule Take one capsule by mouth in the morning and one capsule by mouth at bedtime.  Marland Kitchen ibuprofen (ADVIL,MOTRIN) 200 MG tablet Take 200 mg by mouth every 6 (six) hours as needed.  Marland Kitchen lisinopril (PRINIVIL,ZESTRIL) 10 MG tablet TAKE 1 TABLET EVERY DAY  . meclizine (ANTIVERT) 25 MG tablet Take 1 tablet (25 mg total) by mouth 3 (three) times daily as needed.  . metFORMIN (GLUCOPHAGE) 500 MG tablet Take one tablet in am and two tablets in pm   No current facility-administered medications on file prior to visit.     Review of Systems Per HPI unless specifically indicated in ROS section     Objective:    BP 128/80 (BP Location: Left Arm, Patient Position: Sitting, Cuff Size: Large)   Pulse 76   Temp 98 F (36.7 C) (Oral)   Wt 249 lb (112.9 kg)   SpO2 97%   BMI 33.77 kg/m   Wt Readings from Last 3 Encounters:  09/30/15 249 lb (112.9 kg)  09/05/15 255 lb 4 oz (115.8 kg)  07/27/15 252 lb (114.3 kg)    Physical Exam  Constitutional: He  is oriented to person, place, and time. He appears well-developed and well-nourished. No distress.  Musculoskeletal: He exhibits no edema.  2+ DP bilaterally R calf circ 44cm L calf circ 43cm  Mild discomfort midline lower lumbar spine No paraspinous mm tenderness Neg SLR bilaterally. No pain with int/ext rotation at hip. Neg FABER.  L knee WNL R Knee exam: No deformity on inspection. Tender to palpation at lateral knee and at [facility] No effusion/swelling noted. FROM in flex/extension without crepitus, tender with full extension. + popliteal fullness. Neg drawer test. ++ mcmurray test. No pain with valgus/varus stress. No PFgrind. No abnormal patellar mobility.     Neurological: He is alert and oriented to person, place, and time.  Skin: Skin is warm and dry. No rash noted.  Psychiatric: He has a normal mood and affect.  Nursing note and vitals reviewed.  Results for orders placed or performed in visit on 09/05/15  Hemoglobin A1c  Result Value Ref Range   Hgb A1c MFr Bld 7.5 (H) 4.6 - 6.5 %  Comprehensive metabolic panel  Result Value Ref Range   Sodium 138 135 - 145 mEq/L   Potassium 4.8 3.5 - 5.1 mEq/L   Chloride 102 96 - 112 mEq/L   CO2 31 19 - 32 mEq/L   Glucose, Bld 158 (H) 70 - 99 mg/dL   BUN 19 6 - 23 mg/dL   Creatinine, Ser 0.92 0.40 - 1.50 mg/dL   Total Bilirubin 0.6 0.2 - 1.2 mg/dL   Alkaline Phosphatase 43 39 - 117 U/L   AST 52 (H) 0 - 37 U/L   ALT 57 (H) 0 - 53 U/L   Total Protein 7.1 6.0 - 8.3 g/dL   Albumin 4.1 3.5 - 5.2 g/dL   Calcium 9.7 8.4 - 10.5 mg/dL   GFR 87.13 >60.00 mL/min  Vitamin B12  Result Value Ref Range   Vitamin B-12 378 211 - 911 pg/mL      Assessment & Plan:   Problem List Items Addressed This Visit    Right knee pain - Primary    Calf swelling largely resolved.  Exam today most consistent with knee etiology and not radiculopathy, sciatica, or leg swelling - anticipate LCL strain and degenerative meniscal injury. rec knee sleeve, will phone in tramadol with sedation precautions (ibuprofen ineffective) and rec ice, elevation, rest.  If not better, advised pt to return for steroid injection or eval with Dr Lorelei Pont. Pt agrees with plan. No need or further imaging at this time.        Other Visit Diagnoses   None.      Follow up plan: Return if symptoms worsen or fail to improve.  Ria Bush, MD

## 2015-10-04 DIAGNOSIS — G5791 Unspecified mononeuropathy of right lower limb: Secondary | ICD-10-CM | POA: Diagnosis not present

## 2015-10-04 DIAGNOSIS — M5137 Other intervertebral disc degeneration, lumbosacral region: Secondary | ICD-10-CM | POA: Diagnosis not present

## 2015-10-04 DIAGNOSIS — M9903 Segmental and somatic dysfunction of lumbar region: Secondary | ICD-10-CM | POA: Diagnosis not present

## 2015-10-04 DIAGNOSIS — M9904 Segmental and somatic dysfunction of sacral region: Secondary | ICD-10-CM | POA: Diagnosis not present

## 2015-10-04 DIAGNOSIS — M5441 Lumbago with sciatica, right side: Secondary | ICD-10-CM | POA: Diagnosis not present

## 2015-10-04 DIAGNOSIS — M791 Myalgia: Secondary | ICD-10-CM | POA: Diagnosis not present

## 2015-10-04 DIAGNOSIS — M5136 Other intervertebral disc degeneration, lumbar region: Secondary | ICD-10-CM | POA: Diagnosis not present

## 2015-10-04 DIAGNOSIS — G5792 Unspecified mononeuropathy of left lower limb: Secondary | ICD-10-CM | POA: Diagnosis not present

## 2015-10-05 DIAGNOSIS — M5441 Lumbago with sciatica, right side: Secondary | ICD-10-CM | POA: Diagnosis not present

## 2015-10-05 DIAGNOSIS — M791 Myalgia: Secondary | ICD-10-CM | POA: Diagnosis not present

## 2015-10-05 DIAGNOSIS — M9904 Segmental and somatic dysfunction of sacral region: Secondary | ICD-10-CM | POA: Diagnosis not present

## 2015-10-05 DIAGNOSIS — M9903 Segmental and somatic dysfunction of lumbar region: Secondary | ICD-10-CM | POA: Diagnosis not present

## 2015-10-05 DIAGNOSIS — M5136 Other intervertebral disc degeneration, lumbar region: Secondary | ICD-10-CM | POA: Diagnosis not present

## 2015-10-05 DIAGNOSIS — G5792 Unspecified mononeuropathy of left lower limb: Secondary | ICD-10-CM | POA: Diagnosis not present

## 2015-10-05 DIAGNOSIS — G5791 Unspecified mononeuropathy of right lower limb: Secondary | ICD-10-CM | POA: Diagnosis not present

## 2015-10-05 DIAGNOSIS — M5137 Other intervertebral disc degeneration, lumbosacral region: Secondary | ICD-10-CM | POA: Diagnosis not present

## 2015-10-08 ENCOUNTER — Other Ambulatory Visit: Payer: Self-pay | Admitting: Podiatry

## 2015-10-12 DIAGNOSIS — G5792 Unspecified mononeuropathy of left lower limb: Secondary | ICD-10-CM | POA: Diagnosis not present

## 2015-10-12 DIAGNOSIS — M791 Myalgia: Secondary | ICD-10-CM | POA: Diagnosis not present

## 2015-10-12 DIAGNOSIS — M5136 Other intervertebral disc degeneration, lumbar region: Secondary | ICD-10-CM | POA: Diagnosis not present

## 2015-10-12 DIAGNOSIS — M5441 Lumbago with sciatica, right side: Secondary | ICD-10-CM | POA: Diagnosis not present

## 2015-10-12 DIAGNOSIS — M9904 Segmental and somatic dysfunction of sacral region: Secondary | ICD-10-CM | POA: Diagnosis not present

## 2015-10-12 DIAGNOSIS — G5791 Unspecified mononeuropathy of right lower limb: Secondary | ICD-10-CM | POA: Diagnosis not present

## 2015-10-12 DIAGNOSIS — M5137 Other intervertebral disc degeneration, lumbosacral region: Secondary | ICD-10-CM | POA: Diagnosis not present

## 2015-10-12 DIAGNOSIS — M9903 Segmental and somatic dysfunction of lumbar region: Secondary | ICD-10-CM | POA: Diagnosis not present

## 2015-10-17 ENCOUNTER — Ambulatory Visit (INDEPENDENT_AMBULATORY_CARE_PROVIDER_SITE_OTHER): Payer: Managed Care, Other (non HMO) | Admitting: Podiatry

## 2015-10-17 ENCOUNTER — Encounter: Payer: Self-pay | Admitting: Podiatry

## 2015-10-17 DIAGNOSIS — E1142 Type 2 diabetes mellitus with diabetic polyneuropathy: Secondary | ICD-10-CM

## 2015-10-17 DIAGNOSIS — I2510 Atherosclerotic heart disease of native coronary artery without angina pectoris: Secondary | ICD-10-CM

## 2015-10-17 NOTE — Progress Notes (Signed)
He presents today for follow-up of his neuropathy. He states that by taking 300 mg twice daily of the gabapentin initially worked 100% and he felt great for about 3 weeks. He states that since that time it has regressed to proximally 70% improved. He continues to take 300 mg of gabapentin twice daily.  Objective: Vital signs are stable alert and oriented 3. Pulses are palpable. Neurologic sensorium is unchanged from previous evaluation.  Assessment: Neuropathy bilateral.  Plan: Continue 300 mg twice daily until further notice. If he has any greater regression that we will consider increasing 300 mg to 3 times daily.

## 2015-11-02 ENCOUNTER — Ambulatory Visit (INDEPENDENT_AMBULATORY_CARE_PROVIDER_SITE_OTHER)
Admission: RE | Admit: 2015-11-02 | Discharge: 2015-11-02 | Disposition: A | Discharge status: Home / Self Care | Payer: Managed Care, Other (non HMO) | Source: Ambulatory Visit | Attending: Family Medicine | Admitting: Family Medicine

## 2015-11-02 ENCOUNTER — Encounter: Payer: Self-pay | Admitting: Family Medicine

## 2015-11-02 ENCOUNTER — Ambulatory Visit (INDEPENDENT_AMBULATORY_CARE_PROVIDER_SITE_OTHER): Payer: Managed Care, Other (non HMO) | Admitting: Family Medicine

## 2015-11-02 VITALS — BP 130/68 | HR 80 | Temp 98.2°F | Wt 254.5 lb

## 2015-11-02 DIAGNOSIS — M25551 Pain in right hip: Secondary | ICD-10-CM | POA: Diagnosis not present

## 2015-11-02 DIAGNOSIS — M25552 Pain in left hip: Secondary | ICD-10-CM

## 2015-11-02 DIAGNOSIS — M25561 Pain in right knee: Secondary | ICD-10-CM

## 2015-11-02 DIAGNOSIS — M160 Bilateral primary osteoarthritis of hip: Secondary | ICD-10-CM | POA: Diagnosis not present

## 2015-11-02 DIAGNOSIS — M179 Osteoarthritis of knee, unspecified: Secondary | ICD-10-CM | POA: Diagnosis not present

## 2015-11-02 DIAGNOSIS — I2510 Atherosclerotic heart disease of native coronary artery without angina pectoris: Secondary | ICD-10-CM

## 2015-11-02 NOTE — Assessment & Plan Note (Signed)
Anticipate degenerative hip OA. Check xray to eval arthritic burden. Continue tramadol PRN. Likely refer to ortho. Pt agrees with plan.

## 2015-11-02 NOTE — Progress Notes (Signed)
Pre visit review using our clinic review tool, if applicable. No additional management support is needed unless otherwise documented below in the visit note. 

## 2015-11-02 NOTE — Progress Notes (Signed)
BP 130/68   Pulse 80   Temp 98.2 F (36.8 C) (Oral)   Wt 254 lb 8 oz (115.4 kg)   BMI 34.52 kg/m    CC: f/u R knee and hip pain Subjective:    Patient ID: Casey Golden, male    DOB: 1950/12/21, 65 y.o.   MRN: [id number]  HPI: Casey Golden is a 65 y.o. male presenting on 11/02/2015 for Knee Pain and Hip Pain   See prior note for details. Last visit R knee pain thought LCL strain with degenerative meniscal injury. Treated with knee sleeve, tramadol (ibuprofen ineffective) and ice, elevation, rest.   Bilateral hip pain has worsened. Finds he's able to tolerate ambulation better when he's leaning on buggy.   Finds gabapentin for neuropathy by podiatrist helps him sleep at night.  Ibuprofen 600mg  also helps.  Has seen chiropractor, last 2-3 wks ago, saw for total 12 wks.   Relevant past medical, surgical, family and social history reviewed and updated as indicated. Interim medical history since our last visit reviewed. Allergies and medications reviewed and updated. Current Outpatient Prescriptions on File Prior to Visit  Medication Sig  . albuterol (PROVENTIL HFA;VENTOLIN HFA) 108 (90 BASE) MCG/ACT inhaler Inhale 2 puffs into the lungs every 6 (six) hours as needed for wheezing.  Marland Kitchen aspirin 81 MG EC tablet Take 1 tablet (81 mg total) by mouth daily.  Marland Kitchen atenolol (TENORMIN) 25 MG tablet TAKE 1 TABLET BY MOUTH EVERY DAY  . atorvastatin (LIPITOR) 20 MG tablet Take 20 mg by mouth every evening.   . budesonide-formoterol (SYMBICORT) 160-4.5 MCG/ACT inhaler Inhale into the lungs as needed.   . diclofenac sodium (VOLTAREN) 1 % GEL Apply 1 application topically 3 (three) times daily.  . ergocalciferol (VITAMIN D2) 50000 UNITS capsule Take 1 capsule (50,000 Units total) by mouth once a week.  . fenofibrate (TRICOR) 145 MG tablet TAKE 1 TABLET (145 MG TOTAL) BY MOUTH EVERY EVENING.  Marland Kitchen gabapentin (NEURONTIN) 100 MG capsule TAKE 1 CAPSULE (100 MG TOTAL) BY MOUTH AT BEDTIME.  Marland Kitchen gabapentin  (NEURONTIN) 300 MG capsule Take one capsule by mouth in the morning and one capsule by mouth at bedtime.  Marland Kitchen ibuprofen (ADVIL,MOTRIN) 200 MG tablet Take 200 mg by mouth every 6 (six) hours as needed.  Marland Kitchen lisinopril (PRINIVIL,ZESTRIL) 10 MG tablet TAKE 1 TABLET EVERY DAY  . meclizine (ANTIVERT) 25 MG tablet Take 1 tablet (25 mg total) by mouth 3 (three) times daily as needed.  . metFORMIN (GLUCOPHAGE) 500 MG tablet Take one tablet in am and two tablets in pm  . traMADol (ULTRAM) 50 MG tablet Take 0.5-1 tablets (25-50 mg total) by mouth 2 (two) times daily as needed.   No current facility-administered medications on file prior to visit.     Review of Systems Per HPI unless specifically indicated in ROS section     Objective:    BP 130/68   Pulse 80   Temp 98.2 F (36.8 C) (Oral)   Wt 254 lb 8 oz (115.4 kg)   BMI 34.52 kg/m   Wt Readings from Last 3 Encounters:  11/02/15 254 lb 8 oz (115.4 kg)  09/30/15 249 lb (112.9 kg)  09/05/15 255 lb 4 oz (115.8 kg)    Physical Exam  Constitutional: He appears well-developed and well-nourished. No distress.  Musculoskeletal: Normal range of motion. He exhibits no edema.  Neg SLR bilaterally. ++ pain with int/ext rotation at bilateral hips.  L knee WNL R Knee exam: No  deformity on inspection. + pain with palpation of joint line + swelling noted. FROM in flex/extension with tenderness and crepitus. No popliteal fullness. Neg drawer test. ++ mcmurray test. Discomfort with valgus/varus stress. No PFgrind. No abnormal patellar mobility.   Nursing note and vitals reviewed.     Assessment & Plan:   Problem List Items Addressed This Visit    Bilateral hip pain    Anticipate degenerative hip OA. Check xray to eval arthritic burden. Continue tramadol PRN. Likely refer to ortho. Pt agrees with plan.       Relevant Orders   DG HIPS BILAT WITH PELVIS 3-4 VIEWS   Ambulatory referral to Orthopedic Surgery   Right knee pain - Primary     Anticipate degenerative meniscal injury given + mcmurray test, as well as DJD. Check weight bearing xray to eval arthritic burden. Continue tramadol PRN. Likely refer to ortho. Pt will defer knee injection at this time.       Relevant Orders   DG Knee AP/LAT W/[place] Right   Ambulatory referral to Orthopedic Surgery    Other Visit Diagnoses   None.      Follow up plan: Return if symptoms worsen or fail to improve.  Ria Bush, MD

## 2015-11-02 NOTE — Assessment & Plan Note (Signed)
Anticipate degenerative meniscal injury given + mcmurray test, as well as DJD. Check weight bearing xray to eval arthritic burden. Continue tramadol PRN. Likely refer to ortho. Pt will defer knee injection at this time.

## 2015-11-02 NOTE — Patient Instructions (Addendum)
I do think you have wear and tear arthritis of your knee and hips. Xrays today of R knee, and bilateral hips.  We will refer you to orthopedist.

## 2015-11-18 DIAGNOSIS — M1711 Unilateral primary osteoarthritis, right knee: Secondary | ICD-10-CM | POA: Diagnosis not present

## 2015-11-18 DIAGNOSIS — M5136 Other intervertebral disc degeneration, lumbar region: Secondary | ICD-10-CM | POA: Diagnosis not present

## 2015-11-18 DIAGNOSIS — M1611 Unilateral primary osteoarthritis, right hip: Secondary | ICD-10-CM | POA: Diagnosis not present

## 2015-11-26 ENCOUNTER — Other Ambulatory Visit: Payer: Self-pay | Admitting: Cardiovascular Disease

## 2015-12-02 NOTE — Telephone Encounter (Signed)
I spoke with pt and he was in for appt and got lab results then; nothing further needed.

## 2015-12-12 DIAGNOSIS — M5136 Other intervertebral disc degeneration, lumbar region: Secondary | ICD-10-CM | POA: Diagnosis not present

## 2015-12-14 LAB — HM DIABETES EYE EXAM

## 2015-12-16 ENCOUNTER — Encounter: Payer: Self-pay | Admitting: *Deleted

## 2015-12-18 ENCOUNTER — Other Ambulatory Visit: Payer: Self-pay | Admitting: Cardiovascular Disease

## 2015-12-19 ENCOUNTER — Ambulatory Visit: Payer: Managed Care, Other (non HMO) | Admitting: Podiatry

## 2015-12-19 DIAGNOSIS — M5136 Other intervertebral disc degeneration, lumbar region: Secondary | ICD-10-CM | POA: Diagnosis not present

## 2015-12-26 ENCOUNTER — Telehealth: Payer: Self-pay

## 2015-12-26 NOTE — Telephone Encounter (Signed)
Pt requested new rx for metformin with instructions one in AM and two tabs at night to [facility] [place]. I spoke with Cori Razor at [place] and  rx sent 09/10/15 with those instructions. Cori Razor will get ready for pick up. Mrs Marinez (DPR signed) notified info and voiced understanding.

## 2015-12-27 ENCOUNTER — Telehealth: Payer: Self-pay | Admitting: Family Medicine

## 2015-12-27 ENCOUNTER — Telehealth: Payer: Self-pay | Admitting: Cardiovascular Disease

## 2015-12-27 DIAGNOSIS — M5136 Other intervertebral disc degeneration, lumbar region: Secondary | ICD-10-CM | POA: Diagnosis not present

## 2015-12-27 NOTE — Telephone Encounter (Signed)
Received cardiac clearance request for pt to proceed w/ lateral & post spinal fusion L2-5 w/ Dr. Melina Schools. DOS has not been scheduled yet pending this clearance.  Please route clearance to [facility] Attn: Orson Slick @ [phone].

## 2015-12-27 NOTE — Telephone Encounter (Signed)
Patient needs clearance for spine surgery.  Patient took form to Cardiology for clearance,but Dr.Dahari Rolena Infante at [place] wants clearance from cardiology and from patient's PCP.  Patient said he can bring form to office or do you need to see patient for clearance.  If you want to see patient,when can he be scheduled.  They want to get surgery done by the end of the year.

## 2015-12-28 NOTE — Telephone Encounter (Signed)
In your IN box 

## 2015-12-28 NOTE — Telephone Encounter (Signed)
PT brought in clearance form. Form placed in prescription tower.

## 2016-01-01 NOTE — Telephone Encounter (Signed)
Acceptable risk for surgery No further testing needed 

## 2016-01-01 NOTE — Telephone Encounter (Signed)
Filled out and in my outbox.

## 2016-01-02 ENCOUNTER — Encounter: Payer: Self-pay | Admitting: Podiatry

## 2016-01-02 ENCOUNTER — Telehealth: Payer: Self-pay | Admitting: *Deleted

## 2016-01-02 ENCOUNTER — Ambulatory Visit (INDEPENDENT_AMBULATORY_CARE_PROVIDER_SITE_OTHER): Payer: Managed Care, Other (non HMO) | Admitting: Podiatry

## 2016-01-02 VITALS — Ht 72.0 in | Wt 254.0 lb

## 2016-01-02 DIAGNOSIS — B351 Tinea unguium: Secondary | ICD-10-CM | POA: Diagnosis not present

## 2016-01-02 DIAGNOSIS — E1142 Type 2 diabetes mellitus with diabetic polyneuropathy: Secondary | ICD-10-CM

## 2016-01-02 DIAGNOSIS — M79676 Pain in unspecified toe(s): Secondary | ICD-10-CM | POA: Diagnosis not present

## 2016-01-02 MED ORDER — GABAPENTIN 300 MG PO CAPS: ORAL_CAPSULE | ORAL | 3 refills | Status: DC

## 2016-01-02 NOTE — Telephone Encounter (Signed)
Received request for 90 days for Gabapentin 300mg . Dr. Milinda Pointer ordered #[id number] +3refills.

## 2016-01-02 NOTE — Telephone Encounter (Signed)
Routed to fax # provided. 

## 2016-01-02 NOTE — Progress Notes (Signed)
Complaint:  Visit Type: Patient returns to my office for continued preventative foot care services. Complaint: Patient states" my nails have grown long and thick and become painful to walk and wear shoes" Patient has been diagnosed with DM with neuropathy. The patient presents for preventative foot care services. No changes to ROS  Podiatric Exam: Vascular: dorsalis pedis and posterior tibial pulses are palpable bilateral. Capillary return is immediate. Temperature gradient is WNL. Skin turgor WNL  Sensorium: Normal Semmes Weinstein monofilament test. Normal tactile sensation bilaterally. Nail Exam: Pt has thick disfigured discolored nails with subungual debris noted bilateral entire nail hallux through fifth toenails Ulcer Exam: There is no evidence of ulcer or pre-ulcerative changes or infection. Orthopedic Exam: Muscle tone and strength are WNL. No limitations in general ROM. No crepitus or effusions noted. Foot type and digits show no abnormalities. Bony prominences are unremarkable. Skin: No Porokeratosis. No infection or ulcers  Diagnosis:  Onychomycosis, , Pain in right toe, pain in left toes  Treatment & Plan Procedures and Treatment: Consent by patient was obtained for treatment procedures. The patient understood the discussion of treatment and procedures well. All questions were answered thoroughly reviewed. Debridement of mycotic and hypertrophic toenails, 1 through 5 bilateral and clearing of subungual debris. No ulceration, no infection noted. Patient desires refill on his medicine and requests 90 days for the refill. Return Visit-Office Procedure: Patient instructed to return to the office for a follow up visit 3 months for continued evaluation and treatment.    Gardiner Barefoot DPM

## 2016-01-02 NOTE — Telephone Encounter (Signed)
I faxed clearance form to [facility] and I notified patient form was faxed.

## 2016-01-03 ENCOUNTER — Ambulatory Visit: Payer: Managed Care, Other (non HMO) | Attending: Orthopedic Surgery

## 2016-01-03 DIAGNOSIS — G8929 Other chronic pain: Secondary | ICD-10-CM | POA: Insufficient documentation

## 2016-01-03 DIAGNOSIS — M6281 Muscle weakness (generalized): Secondary | ICD-10-CM | POA: Diagnosis not present

## 2016-01-03 DIAGNOSIS — R262 Difficulty in walking, not elsewhere classified: Secondary | ICD-10-CM | POA: Insufficient documentation

## 2016-01-03 DIAGNOSIS — M5441 Lumbago with sciatica, right side: Secondary | ICD-10-CM | POA: Insufficient documentation

## 2016-01-03 NOTE — Therapy (Signed)
[place] MAIN [facility] SERVICES [address] [place], [place], [address] Phone: [phone]   Fax:  [phone]  Physical Therapy Evaluation  Patient Details  Name: Casey Golden MRN: [id number] Date of Birth: [birth year] Referring Provider: Sherlean Foot PA-C  Encounter Date: 01/03/2016      PT End of Session - 01/03/16 1203    Visit Number 1   Number of Visits 16   Date for PT Re-Evaluation 02/28/16   PT Start Time 0838   PT Stop Time 0930   PT Time Calculation (min) 52 min   Equipment Utilized During Treatment Gait belt   Activity Tolerance Patient tolerated treatment well      Past Medical History:  Diagnosis Date  . Arthritis   . CAD (coronary artery disease)    Cardiologist--  Dr. Ida Rogue  . Colonic polyp   . Fatty liver disease, nonalcoholic 123456   by [place]  . GERD (gastroesophageal reflux disease)   . History of chicken pox   . Hyperlipidemia   . Hypertension   . Nocturia   . OSA (obstructive sleep apnea)    per pt no cpap due to sleep center/ insurance issue-- study done 2014  . Rash of genital area    09-08-2014  per pt Dr Junious Silk aware  . Seasonal and perennial allergic rhinitis   . Type 2 diabetes mellitus ([place])   . Wears dentures    full upper/  partial lower  . Wears glasses     Past Surgical History:  Procedure Laterality Date  . ABDOMINAL HERNIA REPAIR  2007      [facility]   open repair  . APPENDECTOMY  age 44  . CARDIAC CATHETERIZATION  12-23-2007   [facility]   Abnormal myoview w/ ischemia/  40% mRCA with nonobstructive and no sig. plaque in his left system, EF 55%  . CARDIAC CATHETERIZATION  Apr 2008    [facility]   Abnormal myoview/  50% RCA,  ef 65%  . CARDIAC CATHETERIZATION  1999      [facility]  . CERVICAL FUSION  1992  . CHOLECYSTECTOMY OPEN  2006  . DENTAL SURGERY     metal dental implant L mandible  . EXCISION OF SKIN TAG Right 09/14/2014   Procedure: EXCISION OF SKIN TAG;  Surgeon: Festus Aloe, MD;   Location: [facility];  Service: Urology;  Laterality: Right;  . HYDROCELE EXCISION Left 09/14/2014   Procedure: LEFT HYDROCELECTOMY ADULT;  Surgeon: Festus Aloe, MD;  Location: [facility];  Service: Urology;  Laterality: Left;  . MOHS SURGERY  2015   skin cancer  . TONSILLECTOMY  age 54    There were no vitals filed for this visit.       Subjective Assessment - 01/03/16 0855    Subjective Patient reports increased low back pain with recent exaccerbation of sxs's starting 3 or 4 months. Went to the chiropractic which improved symptoms 2 months ago. Patient reports radiating symptoms down the right leg along the lateral aspect of the ankle. Pain gets worse with lifting, driving the car, sitting for prolonged periods of time. Patient states he resting and taking advil to take away. Worse pain in the back 4-5/10, pain is constantly 2/10, 0/10 at best. Worse with activity    Pertinent History 3-4 year history of LBP: Went to chiropractics 2 years.    Limitations Lifting;Standing;Sitting;Walking   How long can you sit comfortably? 1 hours   Currently in Pain?  Yes   Pain Score 2    Pain Location Back   Pain Orientation Right   Pain Descriptors / Indicators Aching;Radiating;Burning;Stabbing   Pain Type Chronic pain   Pain Radiating Towards Down into the lateral side of the ankle             [facility] PT Assessment - 01/03/16 0848      Assessment   Medical Diagnosis Degenerative Lumbar Disc   Referring Provider Sherlean Foot PA-C   Onset Date/Surgical Date 01/30/12   Hand Dominance Right   Next MD Visit unknown   Prior Therapy Chiropractic     Restrictions   Weight Bearing Restrictions No     Balance Screen   Has the patient fallen in the past 6 months No   Has the patient had a decrease in activity level because of a fear of falling?  Yes   Is the patient reluctant to leave their home because of a fear of falling?  No     Home Social research officer, government residence   Living Arrangements Spouse/significant other   Available Help at Discharge Family   Type of [place] to enter   Entrance Stairs-Number of Steps 3   Entrance Stairs-Rails Can reach both   [place] One level   [place] None     Prior Function   Level of Independence Independent   Vocation Full time employment   Vocation Requirements lifting, walking, sitting,    Leisure walk the Orthoptist   Overall Cognitive Status Within Functional Limits for tasks assessed      OBSERVATION: LUMBAR AROM: Lumbar flexion: WNL; Lumbar ext: Can achieve neutral spine (patient stands in lumbar flexion),R Rotation: 25% limited, L Rotation: 25% limited, R Side bending: 50% limited, L Side Bending: 50% limited  Gait: Increased hip external rotation on the R versus left, antalgic gait pattern with forward trunk flexion throughout entirety of movement  Special Tests: Prone SLR: Decreased multifidus and glute activity on the R LE Prone press ups -- x 10 decreased radiating symptoms into the R LE SLUMP: + on R LE for reproduction of symptoms   TREATMENT: Prone press ups with cueing -- x10 Prone on elbows -- 5 min Sit to stands -- x10 with cueing on body positioning   Response to Treatment: Decreased pain and radiating symptoms down the R LE post performing prone exercises.          PT Education - 01/03/16 1201    Education provided Yes   Education Details HEP: Prone lying for 5 min   Person(s) Educated Patient   Methods Explanation;Demonstration   Comprehension Verbalized understanding;Returned demonstration             PT Long Term Goals - 01/03/16 1239      PT LONG TERM GOAL #1   Title Patient will improve MODI to under 10% to demonstrate significant improvement in lumbar dysfunction and less difficulty with performing lifting tasks   Time 8   Period Weeks   Status New     PT LONG TERM GOAL #2   Title  Patient will improve 5XSTS to <16 sec to demonstrate significant improvement in functional LE strength and improved ability to transfer   Baseline 5XSTS: 36sec    Time 8   Period Weeks   Status New     PT LONG TERM GOAL #3   Title Patient will have a worse  pain in the back/LE to 2/10 in the past 2 weeks to demonstrate significant improvement in pain level and better ability to walk.    Baseline worse pain: 5/10   Time 8   Period Weeks   Status New     PT LONG TERM GOAL #4   Title Patient will have a negative SLUMP test on the R to demonstrate significant improvement in LE neuromuscular mobility and greater ability to walk without pain.   Baseline positive SLUMP on R   Time 8   Period Weeks   Status New               Plan - 01/19/2016 1219    Clinical Impression Statement Pt is 65 yo right hand dominant male presenting with increased chronic right sided LBP with radiating symptoms down into the lateral aspect of his ankle. Patient demonstrates lumbar dysfunction as indicated my decreased lumbar extension, pain with funcitonal activities such as lifting, and decrease in pain after performing prone press ups. Patient also demonstrates decreased R  LE strength and impaired sensation along the L4-S1 nerve roots further indicating lumbar dysfunction. Patient also demonstrates decreased muscular strength and endurance and will benefit from further skilled therapy to return to prior level of function.    Rehab Potential Fair   Clinical Impairments Affecting Rehab Potential (+) highly motivated, family support; (-) chronicity of condition   PT Frequency 2x / week   PT Duration 8 weeks   PT Treatment/Interventions Neuromuscular re-education;Stair training;Traction;Moist Heat;Electrical Stimulation;Cryotherapy;Balance training;Therapeutic exercise;Therapeutic activities;Functional mobility training;Manual techniques;Dry needling;Patient/family education   PT Next Visit Plan Improve lumbar  extension in prone   Consulted and Agree with Plan of Care Patient      Patient will benefit from skilled therapeutic intervention in order to improve the following deficits and impairments:  Decreased balance, Difficulty walking, Impaired flexibility, Decreased endurance, Decreased range of motion, Decreased strength, Hypomobility, Increased muscle spasms, Postural dysfunction, Decreased coordination, Abnormal gait, Impaired sensation, Pain  Visit Diagnosis: Chronic right-sided low back pain with right-sided sciatica  Muscle weakness (generalized)  Difficulty in walking, not elsewhere classified      G-Codes - 2016/01/19 1246    Functional Assessment Tool Used 5xSTS, clinical judgement, SLUMP test   Functional Limitation Changing and maintaining body position   Changing and Maintaining Body Position Current Status [id number]) At least 40 percent but less than 60 percent impaired, limited or restricted   Changing and Maintaining Body Position Goal Status [id number]) At least 1 percent but less than 20 percent impaired, limited or restricted       Problem List Patient Active Problem List   Diagnosis Date Noted  . Bilateral hip pain 11/02/2015  . Right knee pain 07/12/2015  . Health maintenance examination 03/07/2015  . Obesity, Class I, BMI 30-34.9 03/07/2015  . Advanced care planning/counseling discussion 03/07/2015  . Transaminitis 05/06/2014  . Uncontrolled type 2 diabetes with neuropathy ([place]) 05/06/2014  . Dyslipidemia associated with type 2 diabetes mellitus ([place]) 05/06/2014  . Dyssomnia 05/06/2014  . Fatty liver disease, nonalcoholic [id number]  . H/O malignant neoplasm of skin 01/01/2013  . HTN (hypertension) 02/15/2009  . CAD (coronary artery disease), native coronary artery 02/15/2009  . Shortness of breath 02/15/2009    Blythe Stanford, PT DPT 19-Jan-2016, 12:48 PM  [place] MAIN [facility] SERVICES [address] [place], [place],  [address] Phone: [phone]   Fax:  [phone]  Name: Casey Golden MRN: [id number] Date of Birth: 1950-05-30

## 2016-01-05 ENCOUNTER — Ambulatory Visit: Payer: Managed Care, Other (non HMO)

## 2016-01-06 ENCOUNTER — Ambulatory Visit: Payer: Self-pay | Admitting: Physician Assistant

## 2016-01-06 ENCOUNTER — Ambulatory Visit: Payer: Managed Care, Other (non HMO)

## 2016-01-06 DIAGNOSIS — G8929 Other chronic pain: Secondary | ICD-10-CM

## 2016-01-06 DIAGNOSIS — R262 Difficulty in walking, not elsewhere classified: Secondary | ICD-10-CM | POA: Diagnosis not present

## 2016-01-06 DIAGNOSIS — M6281 Muscle weakness (generalized): Secondary | ICD-10-CM

## 2016-01-06 DIAGNOSIS — M5441 Lumbago with sciatica, right side: Principal | ICD-10-CM

## 2016-01-06 NOTE — Therapy (Signed)
[place] MAIN [facility] SERVICES [address] [place], [place], [address] Phone: [phone]   Fax:  [phone]  Physical Therapy Treatment  Patient Details  Name: Casey Golden MRN: [id number] Date of Birth: 04-Jan-1951 Referring Provider: Sherlean Foot PA-C  Encounter Date: 01/06/2016      PT End of Session - 01/06/16 0952    Visit Number 2   Number of Visits 16   Date for PT Re-Evaluation 02/28/16   PT Start Time 0915   PT Stop Time 1000   PT Time Calculation (min) 45 min   Equipment Utilized During Treatment Gait belt   Activity Tolerance Patient tolerated treatment well   Behavior During Therapy [facility] for tasks assessed/performed      Past Medical History:  Diagnosis Date  . Arthritis   . CAD (coronary artery disease)    Cardiologist--  Dr. Ida Rogue  . Colonic polyp   . Fatty liver disease, nonalcoholic 123456   by [place]  . GERD (gastroesophageal reflux disease)   . History of chicken pox   . Hyperlipidemia   . Hypertension   . Nocturia   . OSA (obstructive sleep apnea)    per pt no cpap due to sleep center/ insurance issue-- study done 2014  . Rash of genital area    09-08-2014  per pt Dr Junious Silk aware  . Seasonal and perennial allergic rhinitis   . Type 2 diabetes mellitus ([place])   . Wears dentures    full upper/  partial lower  . Wears glasses     Past Surgical History:  Procedure Laterality Date  . ABDOMINAL HERNIA REPAIR  2007      [facility]   open repair  . APPENDECTOMY  age 73  . CARDIAC CATHETERIZATION  12-23-2007   [facility]   Abnormal myoview w/ ischemia/  40% mRCA with nonobstructive and no sig. plaque in his left system, EF 55%  . CARDIAC CATHETERIZATION  Apr 2008    [facility]   Abnormal myoview/  50% RCA,  ef 65%  . CARDIAC CATHETERIZATION  1999      [facility]  . CERVICAL FUSION  1992  . CHOLECYSTECTOMY OPEN  2006  . DENTAL SURGERY     metal dental implant L mandible  . EXCISION OF SKIN TAG Right 09/14/2014    Procedure: EXCISION OF SKIN TAG;  Surgeon: Festus Aloe, MD;  Location: [facility];  Service: Urology;  Laterality: Right;  . HYDROCELE EXCISION Left 09/14/2014   Procedure: LEFT HYDROCELECTOMY ADULT;  Surgeon: Festus Aloe, MD;  Location: [facility];  Service: Urology;  Laterality: Left;  . MOHS SURGERY  2015   skin cancer  . TONSILLECTOMY  age 26    There were no vitals filed for this visit.      Subjective Assessment - 01/06/16 0918    Subjective Patient states he walks every morning around 1.25 miles with his dog. States his back pain is not aggravated after walking. Patient states walking faster upsets his back pain. Patient reports he worked last night which upsetted his back and did not sleep well last night.    Pertinent History 3-4 year history of LBP: Went to chiropractics 2 years.    Limitations Lifting;Standing;Sitting;Walking   How long can you sit comfortably? 1 hours   Currently in Pain? Yes   Pain Score 3    Pain Location Back   Pain Orientation Right   Pain Descriptors / Indicators Aching   Pain  Type Chronic pain         TREATMENT: Manual therapy: STM to R and L lumbar spine along the multifidus to decreased increased muscle spasms and pain. Grade I-II along L2-5 for 30 sec x2 to decrease pain and spasms in the back.  Prone on elbows - x 10 min Prone press ups with cueing on relaxing lumbar and glute musculature - 2 x 10 Prone hip extension - 2 x 7 Closed kinetic chain lumbar/hip extension with UE support - x 15 Hip extension with UE support - 2 x 10  Hip abduction with UE support - 2 x 10          PT Education - 01/06/16 0951    Education provided Yes   Education Details Educated on [employer]) Educated Patient   Methods Explanation;Demonstration   Comprehension Verbalized understanding;Returned demonstration             PT Long Term Goals - 01/03/16 1239      PT LONG TERM GOAL #1    Title Patient will improve MODI to under 10% to demonstrate significant improvement in lumbar dysfunction and less difficulty with performing lifting tasks   Time 8   Period Weeks   Status New     PT LONG TERM GOAL #2   Title Patient will improve 5XSTS to <16 sec to demonstrate significant improvement in functional LE strength and improved ability to transfer   Baseline 5XSTS: 36sec    Time 8   Period Weeks   Status New     PT LONG TERM GOAL #3   Title Patient will have a worse pain in the back/LE to 2/10 in the past 2 weeks to demonstrate significant improvement in pain level and better ability to walk.    Baseline worse pain: 5/10   Time 8   Period Weeks   Status New     PT LONG TERM GOAL #4   Title Patient will have a negative SLUMP test on the R to demonstrate significant improvement in LE neuromuscular mobility and greater ability to walk without pain.   Baseline positive SLUMP on R   Time 8   Period Weeks   Status New               Plan - 01/06/16 [id number]    Clinical Impression Statement Patient demonstrates decreased lumbar pain after performing extension based exercises indicating improved coordination and lumbar AROM. Patient demonstrates increased fatigue at end of session indicating decreased muscular endurance and patient will benefit from further skilled therapy to return to prior level of function.    Rehab Potential Fair   Clinical Impairments Affecting Rehab Potential (+) highly motivated, family support; (-) chronicity of condition   PT Frequency 2x / week   PT Duration 8 weeks   PT Treatment/Interventions Neuromuscular re-education;Stair training;Traction;Moist Heat;Electrical Stimulation;Cryotherapy;Balance training;Therapeutic exercise;Therapeutic activities;Functional mobility training;Manual techniques;Dry needling;Patient/family education   PT Next Visit Plan Improve lumbar extension in prone   Consulted and Agree with Plan of Care Patient       Patient will benefit from skilled therapeutic intervention in order to improve the following deficits and impairments:  Decreased balance, Difficulty walking, Impaired flexibility, Decreased endurance, Decreased range of motion, Decreased strength, Hypomobility, Increased muscle spasms, Postural dysfunction, Decreased coordination, Abnormal gait, Impaired sensation, Pain  Visit Diagnosis: Chronic right-sided low back pain with right-sided sciatica  Muscle weakness (generalized)  Difficulty in walking, not elsewhere classified     Problem List Patient  Active Problem List   Diagnosis Date Noted  . Bilateral hip pain 11/02/2015  . Right knee pain 07/12/2015  . Health maintenance examination 03/07/2015  . Obesity, Class I, BMI 30-34.9 03/07/2015  . Advanced care planning/counseling discussion 03/07/2015  . Transaminitis 05/06/2014  . Uncontrolled type 2 diabetes with neuropathy ([place]) 05/06/2014  . Dyslipidemia associated with type 2 diabetes mellitus ([place]) 05/06/2014  . Dyssomnia 05/06/2014  . Fatty liver disease, nonalcoholic [id number]  . H/O malignant neoplasm of skin 01/01/2013  . HTN (hypertension) 02/15/2009  . CAD (coronary artery disease), native coronary artery 02/15/2009  . Shortness of breath 02/15/2009    Blythe Stanford, PT DPT 01/06/2016, 10:03 AM  [place] MAIN [facility] SERVICES [address] [place], [place], [address] Phone: [phone]   Fax:  [phone]  Name: Casey Golden MRN: [id number] Date of Birth: 04/01/1950

## 2016-01-10 ENCOUNTER — Ambulatory Visit: Payer: Managed Care, Other (non HMO)

## 2016-01-10 DIAGNOSIS — R262 Difficulty in walking, not elsewhere classified: Secondary | ICD-10-CM

## 2016-01-10 DIAGNOSIS — M5441 Lumbago with sciatica, right side: Principal | ICD-10-CM

## 2016-01-10 DIAGNOSIS — M6281 Muscle weakness (generalized): Secondary | ICD-10-CM | POA: Diagnosis not present

## 2016-01-10 DIAGNOSIS — G8929 Other chronic pain: Secondary | ICD-10-CM

## 2016-01-10 NOTE — Therapy (Signed)
[place] MAIN [facility] SERVICES [address] [place], [place], [address] Phone: [phone]   Fax:  [phone]  Physical Therapy Treatment  Patient Details  Name: Casey Golden MRN: [id number] Date of Birth: 06/29/1950 Referring Provider: Sherlean Foot PA-C  Encounter Date: 01/10/2016      PT End of Session - 01/10/16 0901    Visit Number 3   Number of Visits 16   Date for PT Re-Evaluation 02/28/16   PT Start Time 0845   PT Stop Time 0930   PT Time Calculation (min) 45 min   Equipment Utilized During Treatment Gait belt   Activity Tolerance Patient tolerated treatment well   Behavior During Therapy [facility] for tasks assessed/performed      Past Medical History:  Diagnosis Date  . Arthritis   . CAD (coronary artery disease)    Cardiologist--  Dr. Ida Rogue  . Colonic polyp   . Fatty liver disease, nonalcoholic 123456   by [place]  . GERD (gastroesophageal reflux disease)   . History of chicken pox   . Hyperlipidemia   . Hypertension   . Nocturia   . OSA (obstructive sleep apnea)    per pt no cpap due to sleep center/ insurance issue-- study done 2014  . Rash of genital area    09-08-2014  per pt Dr Junious Silk aware  . Seasonal and perennial allergic rhinitis   . Type 2 diabetes mellitus ([place])   . Wears dentures    full upper/  partial lower  . Wears glasses     Past Surgical History:  Procedure Laterality Date  . ABDOMINAL HERNIA REPAIR  2007      [facility]   open repair  . APPENDECTOMY  age 73  . CARDIAC CATHETERIZATION  12-23-2007   [facility]   Abnormal myoview w/ ischemia/  40% mRCA with nonobstructive and no sig. plaque in his left system, EF 55%  . CARDIAC CATHETERIZATION  Apr 2008    [facility]   Abnormal myoview/  50% RCA,  ef 65%  . CARDIAC CATHETERIZATION  1999      [facility]  . CERVICAL FUSION  1992  . CHOLECYSTECTOMY OPEN  2006  . DENTAL SURGERY     metal dental implant L mandible  . EXCISION OF SKIN TAG Right 09/14/2014   Procedure: EXCISION OF SKIN TAG;  Surgeon: Festus Aloe, MD;  Location: [facility];  Service: Urology;  Laterality: Right;  . HYDROCELE EXCISION Left 09/14/2014   Procedure: LEFT HYDROCELECTOMY ADULT;  Surgeon: Festus Aloe, MD;  Location: [facility];  Service: Urology;  Laterality: Left;  . MOHS SURGERY  2015   skin cancer  . TONSILLECTOMY  age 77    There were no vitals filed for this visit.      Subjective Assessment - 01/10/16 0853    Subjective Patient states he's had increased back pain this morning and states he needed to take 3 advil this morning to decrease his pain. Reports he's schedule his nack surgery for early January.    Pertinent History 3-4 year history of LBP: Went to chiropractics 2 years.    Limitations Lifting;Standing;Sitting;Walking   How long can you sit comfortably? 1 hours   Currently in Pain? Yes   Pain Score 4    Pain Location Back   Pain Orientation Right   Pain Descriptors / Indicators Aching   Pain Type Chronic pain      TREATMENT: Manual therapy: STM to R and  L lumbar spine along the multifidus to decreased increased muscle spasms and pain. Grade I-II along L2-5 for 30 sec x2 to decrease pain and spasms in the back.   Prone on elbows - x 10 min Prone press ups with cueing on relaxing lumbar and glute musculature - 2 x 10 Prone hip extension - 2 x 10 Manually assisted hip extension in prone - x 10 Prone press ups with arms straight - 2 x 10 Standing hip/lumbar extension in stand - x 10         PT Education - 01/10/16 0900    Education provided Yes   Education Details HEP: CKC lumbar/hip extension in standing   Person(s) Educated Patient   Methods Explanation;Demonstration   Comprehension Verbalized understanding;Returned demonstration             PT Long Term Goals - 01/03/16 1239      PT LONG TERM GOAL #1   Title Patient will improve MODI to under 10% to demonstrate significant improvement  in lumbar dysfunction and less difficulty with performing lifting tasks   Time 8   Period Weeks   Status New     PT LONG TERM GOAL #2   Title Patient will improve 5XSTS to <16 sec to demonstrate significant improvement in functional LE strength and improved ability to transfer   Baseline 5XSTS: 36sec    Time 8   Period Weeks   Status New     PT LONG TERM GOAL #3   Title Patient will have a worse pain in the back/LE to 2/10 in the past 2 weeks to demonstrate significant improvement in pain level and better ability to walk.    Baseline worse pain: 5/10   Time 8   Period Weeks   Status New     PT LONG TERM GOAL #4   Title Patient will have a negative SLUMP test on the R to demonstrate significant improvement in LE neuromuscular mobility and greater ability to walk without pain.   Baseline positive SLUMP on R   Time 8   Period Weeks   Status New               Plan - 01/10/16 1026    Clinical Impression Statement Patient demonstrates decreased low back pain after prone based exercises indicating muscular coordination and improved AROM. Patient's pain increases when returning to standing due to poor standing posture. Patient's pain decreased when performing standing lumbar extension. Patient will benefit from further skilled therapy to improve standing tolerance.    Rehab Potential Fair   Clinical Impairments Affecting Rehab Potential (+) highly motivated, family support; (-) chronicity of condition   PT Frequency 2x / week   PT Duration 8 weeks   PT Treatment/Interventions Neuromuscular re-education;Stair training;Traction;Moist Heat;Electrical Stimulation;Cryotherapy;Balance training;Therapeutic exercise;Therapeutic activities;Functional mobility training;Manual techniques;Dry needling;Patient/family education   PT Next Visit Plan Improve lumbar extension in prone   Consulted and Agree with Plan of Care Patient      Patient will benefit from skilled therapeutic intervention  in order to improve the following deficits and impairments:  Decreased balance, Difficulty walking, Impaired flexibility, Decreased endurance, Decreased range of motion, Decreased strength, Hypomobility, Increased muscle spasms, Postural dysfunction, Decreased coordination, Abnormal gait, Impaired sensation, Pain  Visit Diagnosis: Chronic right-sided low back pain with right-sided sciatica  Muscle weakness (generalized)  Difficulty in walking, not elsewhere classified     Problem List Patient Active Problem List   Diagnosis Date Noted  . Bilateral hip pain 11/02/2015  .  Right knee pain 07/12/2015  . Health maintenance examination 03/07/2015  . Obesity, Class I, BMI 30-34.9 03/07/2015  . Advanced care planning/counseling discussion 03/07/2015  . Transaminitis 05/06/2014  . Uncontrolled type 2 diabetes with neuropathy ([place]) 05/06/2014  . Dyslipidemia associated with type 2 diabetes mellitus ([place]) 05/06/2014  . Dyssomnia 05/06/2014  . Fatty liver disease, nonalcoholic [id number]  . H/O malignant neoplasm of skin 01/01/2013  . HTN (hypertension) 02/15/2009  . CAD (coronary artery disease), native coronary artery 02/15/2009  . Shortness of breath 02/15/2009    Blythe Stanford, PT DPT 01/10/2016, 10:39 AM  [place] MAIN [facility] SERVICES [place], [place], [address] Phone: [phone]   Fax:  [phone]  Name: JAHMAL MCCLARNON MRN: [id number] Date of Birth: [birth year]

## 2016-01-12 ENCOUNTER — Ambulatory Visit: Payer: Managed Care, Other (non HMO)

## 2016-01-12 DIAGNOSIS — G8929 Other chronic pain: Secondary | ICD-10-CM | POA: Diagnosis not present

## 2016-01-12 DIAGNOSIS — M5441 Lumbago with sciatica, right side: Principal | ICD-10-CM

## 2016-01-12 DIAGNOSIS — R262 Difficulty in walking, not elsewhere classified: Secondary | ICD-10-CM | POA: Diagnosis not present

## 2016-01-12 DIAGNOSIS — M6281 Muscle weakness (generalized): Secondary | ICD-10-CM

## 2016-01-12 NOTE — Therapy (Signed)
[place] MAIN [facility] SERVICES [address] [place], [place], [address] Phone: [phone]   Fax:  [phone]  Physical Therapy Treatment  Patient Details  Name: Casey Golden MRN: [id number] Date of Birth: 12-21-1950 Referring Provider: Sherlean Foot PA-C  Encounter Date: 01/12/2016      PT End of Session - 01/12/16 0856    Visit Number 4   Number of Visits 16   Date for PT Re-Evaluation 02/28/16   PT Start Time 0845   PT Stop Time 0930   PT Time Calculation (min) 45 min   Activity Tolerance Patient tolerated treatment well   Behavior During Therapy [facility] for tasks assessed/performed      Past Medical History:  Diagnosis Date  . Arthritis   . CAD (coronary artery disease)    Cardiologist--  Dr. Ida Rogue  . Colonic polyp   . Fatty liver disease, nonalcoholic 123456   by [place]  . GERD (gastroesophageal reflux disease)   . History of chicken pox   . Hyperlipidemia   . Hypertension   . Nocturia   . OSA (obstructive sleep apnea)    per pt no cpap due to sleep center/ insurance issue-- study done 2014  . Rash of genital area    09-08-2014  per pt Dr Junious Silk aware  . Seasonal and perennial allergic rhinitis   . Type 2 diabetes mellitus ([place])   . Wears dentures    full upper/  partial lower  . Wears glasses     Past Surgical History:  Procedure Laterality Date  . ABDOMINAL HERNIA REPAIR  2007      [facility]   open repair  . APPENDECTOMY  age 59  . CARDIAC CATHETERIZATION  12-23-2007   [facility]   Abnormal myoview w/ ischemia/  40% mRCA with nonobstructive and no sig. plaque in his left system, EF 55%  . CARDIAC CATHETERIZATION  Apr 2008    [facility]   Abnormal myoview/  50% RCA,  ef 65%  . CARDIAC CATHETERIZATION  1999      [facility]  . CERVICAL FUSION  1992  . CHOLECYSTECTOMY OPEN  2006  . DENTAL SURGERY     metal dental implant L mandible  . EXCISION OF SKIN TAG Right 09/14/2014   Procedure: EXCISION OF SKIN TAG;  Surgeon: Festus Aloe, MD;  Location: [facility];  Service: Urology;  Laterality: Right;  . HYDROCELE EXCISION Left 09/14/2014   Procedure: LEFT HYDROCELECTOMY ADULT;  Surgeon: Festus Aloe, MD;  Location: [facility];  Service: Urology;  Laterality: Left;  . MOHS SURGERY  2015   skin cancer  . TONSILLECTOMY  age 89    There were no vitals filed for this visit.      Subjective Assessment - 01/12/16 0851    Subjective Patient states decreased LBP this morning and reports he's feeling pretty good.   Pertinent History 3-4 year history of LBP: Went to chiropractics 2 years.    Limitations Lifting;Standing;Sitting;Walking   How long can you sit comfortably? 1 hours   Currently in Pain? No/denies   Pain Score 0-No pain   Pain Descriptors / Indicators Aching      TREATMENT: Manual therapy: STM to R and L lumbar spine along the multifidus to decreased increased muscle spasms and pain. Grade III along L2-5 for 30 sec x2 to decrease pain and spasms in the back.    Prone on elbows - x 5 min Prone press ups with cueing  on relaxing lumbar and glute musculature - 2 x 10 Prone hip extension - x 10 Glute squeezes in prone - x 15 Bridges in hooklying - x15 Heel taps with TrA activation in hooklying - x10  Standing hip/lumbar extension in standing - x 10 Closed kinetic chain lumbar/hip extension - x15        PT Education - 01/12/16 0855    Education provided Yes   Education Details HEP: Glute sets in prone   Person(s) Educated Patient   Methods Explanation;Demonstration   Comprehension Verbalized understanding;Returned demonstration             PT Long Term Goals - 01/03/16 1239      PT LONG TERM GOAL #1   Title Patient will improve MODI to under 10% to demonstrate significant improvement in lumbar dysfunction and less difficulty with performing lifting tasks   Time 8   Period Weeks   Status New     PT LONG TERM GOAL #2   Title Patient will improve  5XSTS to <16 sec to demonstrate significant improvement in functional LE strength and improved ability to transfer   Baseline 5XSTS: 36sec    Time 8   Period Weeks   Status New     PT LONG TERM GOAL #3   Title Patient will have a worse pain in the back/LE to 2/10 in the past 2 weeks to demonstrate significant improvement in pain level and better ability to walk.    Baseline worse pain: 5/10   Time 8   Period Weeks   Status New     PT LONG TERM GOAL #4   Title Patient will have a negative SLUMP test on the R to demonstrate significant improvement in LE neuromuscular mobility and greater ability to walk without pain.   Baseline positive SLUMP on R   Time 8   Period Weeks   Status New               Plan - 01/12/16 0939    Clinical Impression Statement Patient demonstrates improved resting low back pain today indicating functional carryover between treatment sessions. Although patient is improving, he continues to lack core control indicating decreased muscular coordination and will benefit from further skilled therapy to return to prior level of function.    Rehab Potential Fair   Clinical Impairments Affecting Rehab Potential (+) highly motivated, family support; (-) chronicity of condition   PT Frequency 2x / week   PT Duration 8 weeks   PT Treatment/Interventions Neuromuscular re-education;Stair training;Traction;Moist Heat;Electrical Stimulation;Cryotherapy;Balance training;Therapeutic exercise;Therapeutic activities;Functional mobility training;Manual techniques;Dry needling;Patient/family education   PT Next Visit Plan Improve lumbar extension in prone   Consulted and Agree with Plan of Care Patient      Patient will benefit from skilled therapeutic intervention in order to improve the following deficits and impairments:  Decreased balance, Difficulty walking, Impaired flexibility, Decreased endurance, Decreased range of motion, Decreased strength, Hypomobility, Increased  muscle spasms, Postural dysfunction, Decreased coordination, Abnormal gait, Impaired sensation, Pain  Visit Diagnosis: Chronic right-sided low back pain with right-sided sciatica  Muscle weakness (generalized)  Difficulty in walking, not elsewhere classified     Problem List Patient Active Problem List   Diagnosis Date Noted  . Bilateral hip pain 11/02/2015  . Right knee pain 07/12/2015  . Health maintenance examination 03/07/2015  . Obesity, Class I, BMI 30-34.9 03/07/2015  . Advanced care planning/counseling discussion 03/07/2015  . Transaminitis 05/06/2014  . Uncontrolled type 2 diabetes with neuropathy ([place]) 05/06/2014  .  Dyslipidemia associated with type 2 diabetes mellitus ([place]) 05/06/2014  . Dyssomnia 05/06/2014  . Fatty liver disease, nonalcoholic [id number]  . H/O malignant neoplasm of skin 01/01/2013  . HTN (hypertension) 02/15/2009  . CAD (coronary artery disease), native coronary artery 02/15/2009  . Shortness of breath 02/15/2009    Blythe Stanford, PT DPT 01/12/2016, 10:14 AM  [place] MAIN [facility] SERVICES [address] [place], [place], [address] Phone: [phone]   Fax:  [phone]  Name: Casey Golden MRN: [id number] Date of Birth: [birth year]

## 2016-01-17 ENCOUNTER — Other Ambulatory Visit: Payer: Self-pay | Admitting: Podiatry

## 2016-01-17 ENCOUNTER — Ambulatory Visit: Payer: Managed Care, Other (non HMO)

## 2016-01-17 DIAGNOSIS — M6281 Muscle weakness (generalized): Secondary | ICD-10-CM

## 2016-01-17 DIAGNOSIS — R262 Difficulty in walking, not elsewhere classified: Secondary | ICD-10-CM

## 2016-01-17 DIAGNOSIS — M5441 Lumbago with sciatica, right side: Principal | ICD-10-CM

## 2016-01-17 DIAGNOSIS — G8929 Other chronic pain: Secondary | ICD-10-CM

## 2016-01-17 NOTE — Therapy (Signed)
[place] MAIN [facility] SERVICES [address] [place], [place], [address] Phone: [phone]   Fax:  [phone]  Physical Therapy Treatment  Patient Details  Name: Casey Golden MRN: [id number] Date of Birth: 1950-03-09 Referring Provider: Sherlean Foot PA-C  Encounter Date: 01/17/2016      PT End of Session - 01/17/16 0906    Visit Number 5   Number of Visits 16   Date for PT Re-Evaluation 02/28/16   PT Start Time 0847   PT Stop Time 0930   PT Time Calculation (min) 43 min   Activity Tolerance Patient tolerated treatment well   Behavior During Therapy [facility] for tasks assessed/performed      Past Medical History:  Diagnosis Date  . Arthritis   . CAD (coronary artery disease)    Cardiologist--  Dr. Ida Rogue  . Colonic polyp   . Fatty liver disease, nonalcoholic 123456   by [place]  . GERD (gastroesophageal reflux disease)   . History of chicken pox   . Hyperlipidemia   . Hypertension   . Nocturia   . OSA (obstructive sleep apnea)    per pt no cpap due to sleep center/ insurance issue-- study done 2014  . Rash of genital area    09-08-2014  per pt Dr Junious Silk aware  . Seasonal and perennial allergic rhinitis   . Type 2 diabetes mellitus ([place])   . Wears dentures    full upper/  partial lower  . Wears glasses     Past Surgical History:  Procedure Laterality Date  . ABDOMINAL HERNIA REPAIR  2007      [facility]   open repair  . APPENDECTOMY  age 50  . CARDIAC CATHETERIZATION  12-23-2007   [facility]   Abnormal myoview w/ ischemia/  40% mRCA with nonobstructive and no sig. plaque in his left system, EF 55%  . CARDIAC CATHETERIZATION  Apr 2008    [facility]   Abnormal myoview/  50% RCA,  ef 65%  . CARDIAC CATHETERIZATION  1999      [facility]  . CERVICAL FUSION  1992  . CHOLECYSTECTOMY OPEN  2006  . DENTAL SURGERY     metal dental implant L mandible  . EXCISION OF SKIN TAG Right 09/14/2014   Procedure: EXCISION OF SKIN TAG;  Surgeon: Festus Aloe, MD;  Location: [facility];  Service: Urology;  Laterality: Right;  . HYDROCELE EXCISION Left 09/14/2014   Procedure: LEFT HYDROCELECTOMY ADULT;  Surgeon: Festus Aloe, MD;  Location: [facility];  Service: Urology;  Laterality: Left;  . MOHS SURGERY  2015   skin cancer  . TONSILLECTOMY  age 27    There were no vitals filed for this visit.      Subjective Assessment - 01/17/16 0853    Subjective Patient reports he had no pain in the low back since the previous visit. Patient states he heard recovery time for the surgery is ~ 1 year and is nervous about the procedure being performed.    Pertinent History 3-4 year history of LBP: Went to chiropractics 2 years.    Limitations Lifting;Standing;Sitting;Walking   How long can you sit comfortably? 1 hours   Currently in Pain? No/denies      TREATMENT:  TrA activation with tactile and verbal cueing - x 10 Hookyling marches with TrA activation - 2 x 10 Bridges in hooklying - 2x15 Overhead shoulder flexion with physioball with TrA activation - x 15 Standing hip/lumbar extension  in standing - x 10 Closed kinetic chain lumbar/hip extension - x15  Standing hip abduction with UE support - 2 x 15 Standing hip extension with UE support - 2 x15 Mini squat in standing - 2 x 10          PT Education - 01/17/16 0905    Education provided Yes   Education Details HEP: bridges, standing hip abduction   Person(s) Educated Patient   Methods Explanation;Demonstration   Comprehension Verbalized understanding;Returned demonstration             PT Long Term Goals - 01/03/16 1239      PT LONG TERM GOAL #1   Title Patient will improve MODI to under 10% to demonstrate significant improvement in lumbar dysfunction and less difficulty with performing lifting tasks   Time 8   Period Weeks   Status New     PT LONG TERM GOAL #2   Title Patient will improve 5XSTS to <16 sec to demonstrate significant  improvement in functional LE strength and improved ability to transfer   Baseline 5XSTS: 36sec    Time 8   Period Weeks   Status New     PT LONG TERM GOAL #3   Title Patient will have a worse pain in the back/LE to 2/10 in the past 2 weeks to demonstrate significant improvement in pain level and better ability to walk.    Baseline worse pain: 5/10   Time 8   Period Weeks   Status New     PT LONG TERM GOAL #4   Title Patient will have a negative SLUMP test on the R to demonstrate significant improvement in LE neuromuscular mobility and greater ability to walk without pain.   Baseline positive SLUMP on R   Time 8   Period Weeks   Status New               Plan - 01/17/16 0909    Clinical Impression Statement Patient demonstrates no resting back pain upon returning to the clinic indicating functional carryover and focused treatment on performing core stabilization to progress treatment. Patient requires frequent cueing for proper muscular activation during exercises indicating decreased coordination and patient will benefit from further skilled therapy to return to prior level of function.    Rehab Potential Fair   Clinical Impairments Affecting Rehab Potential (+) highly motivated, family support; (-) chronicity of condition   PT Frequency 2x / week   PT Duration 8 weeks   PT Treatment/Interventions Neuromuscular re-education;Stair training;Traction;Moist Heat;Electrical Stimulation;Cryotherapy;Balance training;Therapeutic exercise;Therapeutic activities;Functional mobility training;Manual techniques;Dry needling;Patient/family education   PT Next Visit Plan Improve lumbar extension in prone   Consulted and Agree with Plan of Care Patient      Patient will benefit from skilled therapeutic intervention in order to improve the following deficits and impairments:  Decreased balance, Difficulty walking, Impaired flexibility, Decreased endurance, Decreased range of motion, Decreased  strength, Hypomobility, Increased muscle spasms, Postural dysfunction, Decreased coordination, Abnormal gait, Impaired sensation, Pain  Visit Diagnosis: Chronic right-sided low back pain with right-sided sciatica  Muscle weakness (generalized)  Difficulty in walking, not elsewhere classified     Problem List Patient Active Problem List   Diagnosis Date Noted  . Bilateral hip pain 11/02/2015  . Right knee pain 07/12/2015  . Health maintenance examination 03/07/2015  . Obesity, Class I, BMI 30-34.9 03/07/2015  . Advanced care planning/counseling discussion 03/07/2015  . Transaminitis 05/06/2014  . Uncontrolled type 2 diabetes with neuropathy ([place]) 05/06/2014  .  Dyslipidemia associated with type 2 diabetes mellitus ([place]) 05/06/2014  . Dyssomnia 05/06/2014  . Fatty liver disease, nonalcoholic [id number]  . H/O malignant neoplasm of skin 01/01/2013  . HTN (hypertension) 02/15/2009  . CAD (coronary artery disease), native coronary artery 02/15/2009  . Shortness of breath 02/15/2009    Blythe Stanford, PT DPT 01/17/2016, 9:33 AM  [place] MAIN [facility] SERVICES [address] [place], [place], [address] Phone: [phone]   Fax:  [phone]  Name: ALDOUS LANDSMAN MRN: [id number] Date of Birth: 28-Dec-1950

## 2016-01-19 ENCOUNTER — Ambulatory Visit: Payer: Managed Care, Other (non HMO)

## 2016-01-24 ENCOUNTER — Ambulatory Visit: Payer: Managed Care, Other (non HMO) | Admitting: Physical Therapy

## 2016-01-24 ENCOUNTER — Encounter: Payer: Self-pay | Admitting: Physical Therapy

## 2016-01-24 DIAGNOSIS — M5441 Lumbago with sciatica, right side: Secondary | ICD-10-CM | POA: Diagnosis not present

## 2016-01-24 DIAGNOSIS — G8929 Other chronic pain: Secondary | ICD-10-CM

## 2016-01-24 DIAGNOSIS — R262 Difficulty in walking, not elsewhere classified: Secondary | ICD-10-CM | POA: Diagnosis not present

## 2016-01-24 DIAGNOSIS — M6281 Muscle weakness (generalized): Secondary | ICD-10-CM | POA: Diagnosis not present

## 2016-01-24 NOTE — Therapy (Signed)
[place] MAIN [facility] SERVICES [address] [place], [place], [address] Phone: [phone]   Fax:  [phone]  Physical Therapy Treatment  Patient Details  Name: Casey Golden MRN: [id number] Date of Birth: November 19, 1950 Referring Provider: Sherlean Foot PA-C  Encounter Date: 01/24/2016      PT End of Session - 01/24/16 1019    Visit Number 6   Number of Visits 16   Date for PT Re-Evaluation 02/28/16   PT Start Time 0849   PT Stop Time 0930   PT Time Calculation (min) 41 min   Activity Tolerance Patient tolerated treatment well;No increased pain   Behavior During Therapy WFL for tasks assessed/performed      Past Medical History:  Diagnosis Date  . Arthritis   . CAD (coronary artery disease)    Cardiologist--  Dr. Ida Rogue  . Colonic polyp   . Fatty liver disease, nonalcoholic 123456   by [place]  . GERD (gastroesophageal reflux disease)   . History of chicken pox   . Hyperlipidemia   . Hypertension   . Nocturia   . OSA (obstructive sleep apnea)    per pt no cpap due to sleep center/ insurance issue-- study done 2014  . Rash of genital area    09-08-2014  per pt Dr Junious Silk aware  . Seasonal and perennial allergic rhinitis   . Type 2 diabetes mellitus ([place])   . Wears dentures    full upper/  partial lower  . Wears glasses     Past Surgical History:  Procedure Laterality Date  . ABDOMINAL HERNIA REPAIR  2007      [facility]   open repair  . APPENDECTOMY  age 19  . CARDIAC CATHETERIZATION  12-23-2007   [facility]   Abnormal myoview w/ ischemia/  40% mRCA with nonobstructive and no sig. plaque in his left system, EF 55%  . CARDIAC CATHETERIZATION  Apr 2008    [facility]   Abnormal myoview/  50% RCA,  ef 65%  . CARDIAC CATHETERIZATION  1999      [facility]  . CERVICAL FUSION  1992  . CHOLECYSTECTOMY OPEN  2006  . DENTAL SURGERY     metal dental implant L mandible  . EXCISION OF SKIN TAG Right 09/14/2014   Procedure: EXCISION OF SKIN TAG;   Surgeon: Festus Aloe, MD;  Location: [facility];  Service: Urology;  Laterality: Right;  . HYDROCELE EXCISION Left 09/14/2014   Procedure: LEFT HYDROCELECTOMY ADULT;  Surgeon: Festus Aloe, MD;  Location: [facility];  Service: Urology;  Laterality: Left;  . MOHS SURGERY  2015   skin cancer  . TONSILLECTOMY  age 89    There were no vitals filed for this visit.      Subjective Assessment - 01/24/16 0854    Subjective Patient reports having bronchitis over the weekend; He reports being sick with that but feeling better after taking a zpack; Patient reports still having occasional back pain but it not being as bad as before; He reports having some RLE lower leg lateral pain 1/10;    Pertinent History 3-4 year history of LBP: Went to chiropractics 2 years.    Limitations Lifting;Standing;Sitting;Walking   How long can you sit comfortably? 1 hours   Currently in Pain? Yes   Pain Score 1    Pain Location Leg   Pain Orientation Right;Lower;Lateral   Pain Descriptors / Indicators Aching   Pain Type Chronic pain  TREATMENT: Manual therapy: Patient prone: STM to R and L lumbar spine along the multifidus to decrease muscle spasms and pain.  Performed ASTYM with edge tool to bilateral lumbar paraspinals for better myofascial release and to reduce discomfort x12 min;  Grade I-II PA mobs along L2-5 for 30 sec x2 to decrease pain and spasms in the back.  Grade II PA mobs to L2, L3 with prone press up with arms straight x10 reps x2 sets to facilitate better lumbar extension and reduce stiffness.  Gentle right lumbosacral distraction 10 sec hold, 5 sec rest x4 min with patient reporting less right LE discomfort;   Prone press ups with cueing on relaxing lumbar and glute musculature - x 10 Alternate knee flexion x10 with cues to avoid painful ROM and to relax lumbar spine during movement; Alternate UE lift x5 bilaterally with patient requiring min  Vcs to avoid rotating spine and to avoid painful ROM;  Alternate LE lift x5 bilaterally with cues to avoid trunk rotation and to increase hip extension; Patient denies any pain with UE/LE movement. However he does demonstrate increased weakness with difficulty performing motion. Decreased right multifidi activation noted;   Patient reports no RLE pain up standing; Standing: Facing wall, lumbar extension AROM x10 reps with cues for positioning to improve lumbar extension;  Seated: Lumbar flexion AROM stretch x4 reps; Educated patient on importance of using towel roll when sitting to promote better lumbar extension and posture;                          PT Education - 01/24/16 1018    Education provided Yes   Education Details posture, positioning, lumbar extension;    Person(s) Educated Patient   Methods Explanation;Demonstration;Verbal cues   Comprehension Verbalized understanding;Returned demonstration;Verbal cues required;Need further instruction             PT Long Term Goals - 01/03/16 1239      PT LONG TERM GOAL #1   Title Patient will improve MODI to under 10% to demonstrate significant improvement in lumbar dysfunction and less difficulty with performing lifting tasks   Time 8   Period Weeks   Status New     PT LONG TERM GOAL #2   Title Patient will improve 5XSTS to <16 sec to demonstrate significant improvement in functional LE strength and improved ability to transfer   Baseline 5XSTS: 36sec    Time 8   Period Weeks   Status New     PT LONG TERM GOAL #3   Title Patient will have a worse pain in the back/LE to 2/10 in the past 2 weeks to demonstrate significant improvement in pain level and better ability to walk.    Baseline worse pain: 5/10   Time 8   Period Weeks   Status New     PT LONG TERM GOAL #4   Title Patient will have a negative SLUMP test on the R to demonstrate significant improvement in LE neuromuscular mobility and greater  ability to walk without pain.   Baseline positive SLUMP on R   Time 8   Period Weeks   Status New               Plan - 01/24/16 1031    Clinical Impression Statement Patient reports slight discomfort at start of session today; Instructed patient in lumbar extension exercise to faciliate better flexibility and reduce back and RLE discomfort. Patient responded well to manual therapy;  He was able to perform a better prone press up with less discomfort. educated patient in correct positioning with sitting. He would benefit from additional skilled PT intervention to reduce pain and return to PLOF.    Rehab Potential Fair   Clinical Impairments Affecting Rehab Potential (+) highly motivated, family support; (-) chronicity of condition   PT Frequency 2x / week   PT Duration 8 weeks   PT Treatment/Interventions Neuromuscular re-education;Stair training;Traction;Moist Heat;Electrical Stimulation;Cryotherapy;Balance training;Therapeutic exercise;Therapeutic activities;Functional mobility training;Manual techniques;Dry needling;Patient/family education   PT Next Visit Plan Improve lumbar extension in prone   Consulted and Agree with Plan of Care Patient      Patient will benefit from skilled therapeutic intervention in order to improve the following deficits and impairments:  Decreased balance, Difficulty walking, Impaired flexibility, Decreased endurance, Decreased range of motion, Decreased strength, Hypomobility, Increased muscle spasms, Postural dysfunction, Decreased coordination, Abnormal gait, Impaired sensation, Pain  Visit Diagnosis: Chronic right-sided low back pain with right-sided sciatica  Muscle weakness (generalized)  Difficulty in walking, not elsewhere classified     Problem List Patient Active Problem List   Diagnosis Date Noted  . Bilateral hip pain 11/02/2015  . Right knee pain 07/12/2015  . Health maintenance examination 03/07/2015  . Obesity, Class I, BMI  30-34.9 03/07/2015  . Advanced care planning/counseling discussion 03/07/2015  . Transaminitis 05/06/2014  . Uncontrolled type 2 diabetes with neuropathy ([place]) 05/06/2014  . Dyslipidemia associated with type 2 diabetes mellitus ([place]) 05/06/2014  . Dyssomnia 05/06/2014  . Fatty liver disease, nonalcoholic [id number]  . H/O malignant neoplasm of skin 01/01/2013  . HTN (hypertension) 02/15/2009  . CAD (coronary artery disease), native coronary artery 02/15/2009  . Shortness of breath 02/15/2009    Seletha Zimmermann PT, DPT 01/24/2016, 10:36 AM  [place] MAIN [facility] SERVICES [address] [place], [place], [address] Phone: [phone]   Fax:  [phone]  Name: Casey Golden MRN: [id number] Date of Birth: Dec 09, 1950

## 2016-01-26 ENCOUNTER — Ambulatory Visit: Payer: Managed Care, Other (non HMO)

## 2016-01-26 DIAGNOSIS — G8929 Other chronic pain: Secondary | ICD-10-CM | POA: Diagnosis not present

## 2016-01-26 DIAGNOSIS — M6281 Muscle weakness (generalized): Secondary | ICD-10-CM | POA: Diagnosis not present

## 2016-01-26 DIAGNOSIS — R262 Difficulty in walking, not elsewhere classified: Secondary | ICD-10-CM | POA: Diagnosis not present

## 2016-01-26 DIAGNOSIS — M5441 Lumbago with sciatica, right side: Secondary | ICD-10-CM | POA: Diagnosis not present

## 2016-01-26 NOTE — Therapy (Signed)
[place] MAIN [facility] SERVICES [address] [place], [place], [address] Phone: [phone]   Fax:  [phone]  Physical Therapy Treatment  Patient Details  Name: Casey Golden MRN: [id number] Date of Birth: Feb 06, 1950 Referring Provider: Sherlean Foot PA-C  Encounter Date: 01/26/2016      PT End of Session - 01/26/16 0855    Visit Number 7   Number of Visits 16   Date for PT Re-Evaluation 02/28/16   PT Start Time 0846   PT Stop Time 0930   PT Time Calculation (min) 44 min   Activity Tolerance Patient tolerated treatment well;No increased pain   Behavior During Therapy WFL for tasks assessed/performed      Past Medical History:  Diagnosis Date  . Arthritis   . CAD (coronary artery disease)    Cardiologist--  Dr. Ida Rogue  . Colonic polyp   . Fatty liver disease, nonalcoholic 123456   by [place]  . GERD (gastroesophageal reflux disease)   . History of chicken pox   . Hyperlipidemia   . Hypertension   . Nocturia   . OSA (obstructive sleep apnea)    per pt no cpap due to sleep center/ insurance issue-- study done 2014  . Rash of genital area    09-08-2014  per pt Dr Junious Silk aware  . Seasonal and perennial allergic rhinitis   . Type 2 diabetes mellitus ([place])   . Wears dentures    full upper/  partial lower  . Wears glasses     Past Surgical History:  Procedure Laterality Date  . ABDOMINAL HERNIA REPAIR  2007      [facility]   open repair  . APPENDECTOMY  age 3  . CARDIAC CATHETERIZATION  12-23-2007   [facility]   Abnormal myoview w/ ischemia/  40% mRCA with nonobstructive and no sig. plaque in his left system, EF 55%  . CARDIAC CATHETERIZATION  Apr 2008    [facility]   Abnormal myoview/  50% RCA,  ef 65%  . CARDIAC CATHETERIZATION  1999      [facility]  . CERVICAL FUSION  1992  . CHOLECYSTECTOMY OPEN  2006  . DENTAL SURGERY     metal dental implant L mandible  . EXCISION OF SKIN TAG Right 09/14/2014   Procedure: EXCISION OF SKIN TAG;   Surgeon: Festus Aloe, MD;  Location: [facility];  Service: Urology;  Laterality: Right;  . HYDROCELE EXCISION Left 09/14/2014   Procedure: LEFT HYDROCELECTOMY ADULT;  Surgeon: Festus Aloe, MD;  Location: [facility];  Service: Urology;  Laterality: Left;  . MOHS SURGERY  2015   skin cancer  . TONSILLECTOMY  age 89    There were no vitals filed for this visit.      Subjective Assessment - 01/26/16 0851    Subjective Patient reports he's still having residual symptoms of the bronchitis and some minor back pain. States no pain radiating down the leg.    Pertinent History 3-4 year history of LBP: Went to chiropractics 2 years.    Limitations Lifting;Standing;Sitting;Walking   How long can you sit comfortably? 1 hours   Currently in Pain? Yes   Pain Score 1    Pain Location Back   Pain Orientation Right   Pain Descriptors / Indicators Aching   Pain Type Chronic pain      TREATMENT:  Prone press ups - 2 x 10 Prone hip extension SLR - x 10 B Bridges in hooklying - 2x10  Overhead shoulder flexion with physioball with TrA activation - 2x 10 Hookyling marches with TrA activation - 2 x 10 Closed kinetic chain lumbar/hip extension in standing - x15  Standing hip abduction with UE support - 2 x 15 Standing hip extension with UE support - 2 x15 Resisted walking backwards at MATRIX - 2x5 12.5#       PT Education - 01/26/16 0854    Education provided Yes   Education Details form and technique   Person(s) Educated Patient   Methods Explanation;Demonstration   Comprehension Verbalized understanding;Returned demonstration             PT Long Term Goals - 01/03/16 1239      PT LONG TERM GOAL #1   Title Patient will improve MODI to under 10% to demonstrate significant improvement in lumbar dysfunction and less difficulty with performing lifting tasks   Time 8   Period Weeks   Status New     PT LONG TERM GOAL #2   Title Patient will  improve 5XSTS to <16 sec to demonstrate significant improvement in functional LE strength and improved ability to transfer   Baseline 5XSTS: 36sec    Time 8   Period Weeks   Status New     PT LONG TERM GOAL #3   Title Patient will have a worse pain in the back/LE to 2/10 in the past 2 weeks to demonstrate significant improvement in pain level and better ability to walk.    Baseline worse pain: 5/10   Time 8   Period Weeks   Status New     PT LONG TERM GOAL #4   Title Patient will have a negative SLUMP test on the R to demonstrate significant improvement in LE neuromuscular mobility and greater ability to walk without pain.   Baseline positive SLUMP on R   Time 8   Period Weeks   Status New               Plan - 01/26/16 [id number]    Clinical Impression Statement Improvement in pain/symtpoms after performing extension based exercises indicating improvement in muscular coordination and improvement lumbar ext AROM. Although patient is improving, he continues to have minimal pain and increased weakness throughout R LE and will benefit from further skilled therapy to return to prior level of function.    Rehab Potential Fair   Clinical Impairments Affecting Rehab Potential (+) highly motivated, family support; (-) chronicity of condition   PT Frequency 2x / week   PT Duration 8 weeks   PT Treatment/Interventions Neuromuscular re-education;Stair training;Traction;Moist Heat;Electrical Stimulation;Cryotherapy;Balance training;Therapeutic exercise;Therapeutic activities;Functional mobility training;Manual techniques;Dry needling;Patient/family education   PT Next Visit Plan Improve lumbar extension in prone   Consulted and Agree with Plan of Care Patient      Patient will benefit from skilled therapeutic intervention in order to improve the following deficits and impairments:  Decreased balance, Difficulty walking, Impaired flexibility, Decreased endurance, Decreased range of motion,  Decreased strength, Hypomobility, Increased muscle spasms, Postural dysfunction, Decreased coordination, Abnormal gait, Impaired sensation, Pain  Visit Diagnosis: Chronic right-sided low back pain with right-sided sciatica  Muscle weakness (generalized)  Difficulty in walking, not elsewhere classified     Problem List Patient Active Problem List   Diagnosis Date Noted  . Bilateral hip pain 11/02/2015  . Right knee pain 07/12/2015  . Health maintenance examination 03/07/2015  . Obesity, Class I, BMI 30-34.9 03/07/2015  . Advanced care planning/counseling discussion 03/07/2015  . Transaminitis 05/06/2014  . Uncontrolled type  2 diabetes with neuropathy ([place]) 05/06/2014  . Dyslipidemia associated with type 2 diabetes mellitus ([place]) 05/06/2014  . Dyssomnia 05/06/2014  . Fatty liver disease, nonalcoholic [id number]  . H/O malignant neoplasm of skin 01/01/2013  . HTN (hypertension) 02/15/2009  . CAD (coronary artery disease), native coronary artery 02/15/2009  . Shortness of breath 02/15/2009    Blythe Stanford, PT DPT 01/26/2016, 9:11 AM  [place] MAIN [facility] SERVICES [address] [place], [place], [address] Phone: [phone]   Fax:  [phone]  Name: Casey Golden MRN: [id number] Date of Birth: 1950/06/30

## 2016-01-31 ENCOUNTER — Ambulatory Visit: Payer: Managed Care, Other (non HMO) | Attending: Orthopedic Surgery

## 2016-01-31 DIAGNOSIS — M5441 Lumbago with sciatica, right side: Secondary | ICD-10-CM | POA: Insufficient documentation

## 2016-01-31 DIAGNOSIS — M6281 Muscle weakness (generalized): Secondary | ICD-10-CM | POA: Insufficient documentation

## 2016-01-31 DIAGNOSIS — G8929 Other chronic pain: Secondary | ICD-10-CM | POA: Insufficient documentation

## 2016-01-31 DIAGNOSIS — R262 Difficulty in walking, not elsewhere classified: Secondary | ICD-10-CM | POA: Insufficient documentation

## 2016-01-31 NOTE — Therapy (Signed)
[place] MAIN [facility] SERVICES [address] [place], [place], [address] Phone: [phone]   Fax:  [phone]  Physical Therapy Treatment  Patient Details  Name: Casey Golden MRN: [id number] Date of Birth: [birth year] Referring Provider: Sherlean Foot PA-C  Encounter Date: 01/31/2016      PT End of Session - 01/31/16 0914    Visit Number 8   Number of Visits 16   Date for PT Re-Evaluation 02/28/16   PT Start Time [id number]   PT Stop Time 0930   PT Time Calculation (min) 43 min   Activity Tolerance Patient tolerated treatment well;No increased pain   Behavior During Therapy WFL for tasks assessed/performed      Past Medical History:  Diagnosis Date  . Arthritis   . CAD (coronary artery disease)    Cardiologist--  Dr. Ida Rogue  . Colonic polyp   . Fatty liver disease, nonalcoholic 123456   by [place]  . GERD (gastroesophageal reflux disease)   . History of chicken pox   . Hyperlipidemia   . Hypertension   . Nocturia   . OSA (obstructive sleep apnea)    per pt no cpap due to sleep center/ insurance issue-- study done 2014  . Rash of genital area    09-08-2014  per pt Dr Junious Silk aware  . Seasonal and perennial allergic rhinitis   . Type 2 diabetes mellitus ([place])   . Wears dentures    full upper/  partial lower  . Wears glasses     Past Surgical History:  Procedure Laterality Date  . ABDOMINAL HERNIA REPAIR  2007      [facility]   open repair  . APPENDECTOMY  age 86  . CARDIAC CATHETERIZATION  12-23-2007   [facility]   Abnormal myoview w/ ischemia/  40% mRCA with nonobstructive and no sig. plaque in his left system, EF 55%  . CARDIAC CATHETERIZATION  Apr 2008    [facility]   Abnormal myoview/  50% RCA,  ef 65%  . CARDIAC CATHETERIZATION  1999      [facility]  . CERVICAL FUSION  1992  . CHOLECYSTECTOMY OPEN  2006  . DENTAL SURGERY     metal dental implant L mandible  . EXCISION OF SKIN TAG Right 09/14/2014   Procedure: EXCISION OF SKIN TAG;   Surgeon: Festus Aloe, MD;  Location: [facility];  Service: Urology;  Laterality: Right;  . HYDROCELE EXCISION Left 09/14/2014   Procedure: LEFT HYDROCELECTOMY ADULT;  Surgeon: Festus Aloe, MD;  Location: [facility];  Service: Urology;  Laterality: Left;  . MOHS SURGERY  2015   skin cancer  . TONSILLECTOMY  age 33    There were no vitals filed for this visit.      Subjective Assessment - 01/31/16 0906    Subjective Patient reports no back pain but reports increased R knee pain.   Pertinent History 3-4 year history of LBP: Went to chiropractics 2 years.    Limitations Lifting;Standing;Sitting;Walking   How long can you sit comfortably? 1 hours   Currently in Pain? No/denies      LTRs under green physioball - 2 min Knees to chest under physioball - 2 min  Bridges in hooklying - 2 x 15 SLR with TrA activation-2 x 10 B CKC hip and lumbar extension-2 min Seated ball roll outs under the Right knee - 2 min  Standing Hip abduction with UE support - 2 x 15 Standing hip extension with UE  support - 2 x 15  Standing Mini squats with cueing on glute activation - x 20  Leg Press at quantum -  x 20 105#       PT Education - 01/31/16 0913    Education provided Yes   Education Details pain physiology   Person(s) Educated Patient   Methods Explanation   Comprehension Verbalized understanding             PT Long Term Goals - 01/03/16 1239      PT LONG TERM GOAL #1   Title Patient will improve MODI to under 10% to demonstrate significant improvement in lumbar dysfunction and less difficulty with performing lifting tasks   Time 8   Period Weeks   Status New     PT LONG TERM GOAL #2   Title Patient will improve 5XSTS to <16 sec to demonstrate significant improvement in functional LE strength and improved ability to transfer   Baseline 5XSTS: 36sec    Time 8   Period Weeks   Status New     PT LONG TERM GOAL #3   Title Patient will have a  worse pain in the back/LE to 2/10 in the past 2 weeks to demonstrate significant improvement in pain level and better ability to walk.    Baseline worse pain: 5/10   Time 8   Period Weeks   Status New     PT LONG TERM GOAL #4   Title Patient will have a negative SLUMP test on the R to demonstrate significant improvement in LE neuromuscular mobility and greater ability to walk without pain.   Baseline positive SLUMP on R   Time 8   Period Weeks   Status New               Plan - 01/31/16 0915    Clinical Impression Statement Patient demonstrates improvement in resting symptoms indicating functional carryover between visits. Although patient is improving, he continues to have difficulties with performing standing activties indicating decreased LE strength and endurance and patient will benefit from further skilled therapy to return to prior level of function.    Rehab Potential Fair   Clinical Impairments Affecting Rehab Potential (+) highly motivated, family support; (-) chronicity of condition   PT Frequency 2x / week   PT Duration 8 weeks   PT Treatment/Interventions Neuromuscular re-education;Stair training;Traction;Moist Heat;Electrical Stimulation;Cryotherapy;Balance training;Therapeutic exercise;Therapeutic activities;Functional mobility training;Manual techniques;Dry needling;Patient/family education   PT Next Visit Plan Improve lumbar extension in prone   Consulted and Agree with Plan of Care Patient      Patient will benefit from skilled therapeutic intervention in order to improve the following deficits and impairments:  Decreased balance, Difficulty walking, Impaired flexibility, Decreased endurance, Decreased range of motion, Decreased strength, Hypomobility, Increased muscle spasms, Postural dysfunction, Decreased coordination, Abnormal gait, Impaired sensation, Pain  Visit Diagnosis: Muscle weakness (generalized)  Difficulty in walking, not elsewhere  classified  Chronic right-sided low back pain with right-sided sciatica     Problem List Patient Active Problem List   Diagnosis Date Noted  . Bilateral hip pain 11/02/2015  . Right knee pain 07/12/2015  . Health maintenance examination 03/07/2015  . Obesity, Class I, BMI 30-34.9 03/07/2015  . Advanced care planning/counseling discussion 03/07/2015  . Transaminitis 05/06/2014  . Uncontrolled type 2 diabetes with neuropathy ([place]) 05/06/2014  . Dyslipidemia associated with type 2 diabetes mellitus ([place]) 05/06/2014  . Dyssomnia 05/06/2014  . Fatty liver disease, nonalcoholic [id number]  . H/O malignant neoplasm of skin  01/01/2013  . HTN (hypertension) 02/15/2009  . CAD (coronary artery disease), native coronary artery 02/15/2009  . Shortness of breath 02/15/2009    Blythe Stanford, PT DPT 01/31/2016, 9:29 AM  [place] MAIN [facility] SERVICES [address] [place], [place], [address] Phone: [phone]   Fax:  [phone]  Name: Casey Golden MRN: [id number] Date of Birth: [birth year]

## 2016-02-02 ENCOUNTER — Ambulatory Visit: Payer: Managed Care, Other (non HMO)

## 2016-02-03 ENCOUNTER — Inpatient Hospital Stay (HOSPITAL_COMMUNITY): Admission: RE | Admit: 2016-02-03 | Payer: Managed Care, Other (non HMO) | Source: Ambulatory Visit

## 2016-02-06 ENCOUNTER — Ambulatory Visit: Payer: Managed Care, Other (non HMO)

## 2016-02-06 DIAGNOSIS — M6281 Muscle weakness (generalized): Secondary | ICD-10-CM

## 2016-02-06 DIAGNOSIS — M5441 Lumbago with sciatica, right side: Secondary | ICD-10-CM

## 2016-02-06 DIAGNOSIS — R262 Difficulty in walking, not elsewhere classified: Secondary | ICD-10-CM

## 2016-02-06 DIAGNOSIS — G8929 Other chronic pain: Secondary | ICD-10-CM

## 2016-02-06 NOTE — Therapy (Signed)
[place] MAIN [facility] SERVICES [address] [place], [place], [address] Phone: [phone]   Fax:  [phone]  Physical Therapy Treatment  Patient Details  Name: Casey Golden MRN: [id number] Date of Birth: [birth year] Referring Provider: Sherlean Foot PA-C  Encounter Date: 02/06/2016      PT End of Session - 02/06/16 0906    Visit Number 9   Number of Visits 16   Date for PT Re-Evaluation 02/28/16   PT Start Time 0848   PT Stop Time 0930   PT Time Calculation (min) 42 min   Activity Tolerance Patient tolerated treatment well;No increased pain   Behavior During Therapy WFL for tasks assessed/performed      Past Medical History:  Diagnosis Date  . Arthritis   . CAD (coronary artery disease)    Cardiologist--  Dr. Ida Rogue  . Colonic polyp   . Fatty liver disease, nonalcoholic [id number]   by [place]  . GERD (gastroesophageal reflux disease)   . History of chicken pox   . Hyperlipidemia   . Hypertension   . Nocturia   . OSA (obstructive sleep apnea)    per pt no cpap due to sleep center/ insurance issue-- study done 2014  . Rash of genital area    09-08-2014  per pt Dr Junious Silk aware  . Seasonal and perennial allergic rhinitis   . Type 2 diabetes mellitus ([employer])   . Wears dentures    full upper/  partial lower  . Wears glasses     Past Surgical History:  Procedure Laterality Date  . ABDOMINAL HERNIA REPAIR  2007      [facility]   open repair  . APPENDECTOMY  age 2  . CARDIAC CATHETERIZATION  12-23-2007   [facility]   Abnormal myoview w/ ischemia/  40% mRCA with nonobstructive and no sig. plaque in his left system, EF 55%  . CARDIAC CATHETERIZATION  Apr 2008    [facility]   Abnormal myoview/  50% RCA,  ef 65%  . CARDIAC CATHETERIZATION  1999      [facility]  . CERVICAL FUSION  1992  . CHOLECYSTECTOMY OPEN  2006  . DENTAL SURGERY     metal dental implant L mandible  . EXCISION OF SKIN TAG Right 09/14/2014   Procedure: EXCISION OF SKIN TAG;   Surgeon: Festus Aloe, MD;  Location: [facility];  Service: Urology;  Laterality: Right;  . HYDROCELE EXCISION Left 09/14/2014   Procedure: LEFT HYDROCELECTOMY ADULT;  Surgeon: Festus Aloe, MD;  Location: [facility];  Service: Urology;  Laterality: Left;  . MOHS SURGERY  2015   skin cancer  . TONSILLECTOMY  age 67    There were no vitals filed for this visit.      Subjective Assessment - 02/06/16 0904    Subjective Patient reports no back pain today and reports minimal numbness in the LE over the weekend.    Pertinent History 3-4 year history of LBP: Went to chiropractics 2 years.    Limitations Lifting;Standing;Sitting;Walking   How long can you sit comfortably? 1 hours   Currently in Pain? No/denies      Therapeutic Exercise Seated multifidus crunch - x15  Bridges in hooklying -  x 15 SLR with TrA activation-2 x 10 B CKC hip and lumbar extension-2 min Standing Hip abduction with UE support - x25 Standing hip extension with UE support - x25 Resisted walkouts (backward/side stepping) at [employer] - x3 down and back  PT Education - 02/06/16 0906    Education provided Yes   Education Details Form/Technique with exercise   Person(s) Educated Patient   Methods Explanation;Demonstration   Comprehension Verbalized understanding;Returned demonstration             PT Long Term Goals - 01/03/16 1239      PT LONG TERM GOAL #1   Title Patient will improve MODI to under 10% to demonstrate significant improvement in lumbar dysfunction and less difficulty with performing lifting tasks   Time 8   Period Weeks   Status New     PT LONG TERM GOAL #2   Title Patient will improve 5XSTS to <16 sec to demonstrate significant improvement in functional LE strength and improved ability to transfer   Baseline 5XSTS: 36sec    Time 8   Period Weeks   Status New     PT LONG TERM GOAL #3   Title Patient will have a worse pain in the  back/LE to 2/10 in the past 2 weeks to demonstrate significant improvement in pain level and better ability to walk.    Baseline worse pain: 5/10   Time 8   Period Weeks   Status New     PT LONG TERM GOAL #4   Title Patient will have a negative SLUMP test on the R to demonstrate significant improvement in LE neuromuscular mobility and greater ability to walk without pain.   Baseline positive SLUMP on R   Time 8   Period Weeks   Status New               Plan - 02/06/16 0911    Clinical Impression Statement Patient demonstrates improvement in core stabilization with decreased fatigue with exercise. Patient continues to demonstrate decreased motor control with movement requiring verbal cueing to perform exercises and will benefit from further skilled therapy to return to prior level of function.    Rehab Potential Fair   Clinical Impairments Affecting Rehab Potential (+) highly motivated, family support; (-) chronicity of condition   PT Frequency 2x / week   PT Duration 8 weeks   PT Treatment/Interventions Neuromuscular re-education;Stair training;Traction;Moist Heat;Electrical Stimulation;Cryotherapy;Balance training;Therapeutic exercise;Therapeutic activities;Functional mobility training;Manual techniques;Dry needling;Patient/family education   PT Next Visit Plan Improve lumbar extension in prone   Consulted and Agree with Plan of Care Patient      Patient will benefit from skilled therapeutic intervention in order to improve the following deficits and impairments:  Decreased balance, Difficulty walking, Impaired flexibility, Decreased endurance, Decreased range of motion, Decreased strength, Hypomobility, Increased muscle spasms, Postural dysfunction, Decreased coordination, Abnormal gait, Impaired sensation, Pain  Visit Diagnosis: Muscle weakness (generalized)  Difficulty in walking, not elsewhere classified  Chronic right-sided low back pain with right-sided  sciatica     Problem List Patient Active Problem List   Diagnosis Date Noted  . Bilateral hip pain 11/02/2015  . Right knee pain 07/12/2015  . Health maintenance examination 03/07/2015  . Obesity, Class I, BMI 30-34.9 03/07/2015  . Advanced care planning/counseling discussion 03/07/2015  . Transaminitis 05/06/2014  . Uncontrolled type 2 diabetes with neuropathy ([place]) 05/06/2014  . Dyslipidemia associated with type 2 diabetes mellitus ([place]) 05/06/2014  . Dyssomnia 05/06/2014  . Fatty liver disease, nonalcoholic [id number]  . H/O malignant neoplasm of skin 01/01/2013  . HTN (hypertension) 02/15/2009  . CAD (coronary artery disease), native coronary artery 02/15/2009  . Shortness of breath 02/15/2009    Blythe Stanford, PT DPT 02/06/2016, 10:48 AM  [place] MAIN [facility] SERVICES [place], [place], [address] Phone: [phone]   Fax:  [phone]  Name: Casey Golden MRN: [id number] Date of Birth: [birth year]

## 2016-02-08 ENCOUNTER — Encounter (HOSPITAL_COMMUNITY): Admission: RE | Payer: Self-pay | Source: Ambulatory Visit

## 2016-02-08 ENCOUNTER — Inpatient Hospital Stay (HOSPITAL_COMMUNITY)
Admission: RE | Admit: 2016-02-08 | Payer: Managed Care, Other (non HMO) | Source: Ambulatory Visit | Admitting: Orthopedic Surgery

## 2016-02-08 SURGERY — ANTERIOR LATERAL LUMBAR FUSION 3 LEVELS
Anesthesia: General

## 2016-02-09 ENCOUNTER — Inpatient Hospital Stay: Admit: 2016-02-09 | Payer: Managed Care, Other (non HMO) | Admitting: Orthopedic Surgery

## 2016-02-09 SURGERY — POSTERIOR LUMBAR FUSION 3 LEVEL
Anesthesia: General

## 2016-02-13 DIAGNOSIS — M5136 Other intervertebral disc degeneration, lumbar region: Secondary | ICD-10-CM | POA: Diagnosis not present

## 2016-02-27 ENCOUNTER — Encounter: Payer: Self-pay | Admitting: Cardiovascular Disease

## 2016-02-27 ENCOUNTER — Ambulatory Visit (INDEPENDENT_AMBULATORY_CARE_PROVIDER_SITE_OTHER): Payer: Managed Care, Other (non HMO) | Admitting: Cardiovascular Disease

## 2016-02-27 VITALS — BP 162/70 | HR 67 | Ht 67.0 in | Wt 255.5 lb

## 2016-02-27 DIAGNOSIS — I2510 Atherosclerotic heart disease of native coronary artery without angina pectoris: Secondary | ICD-10-CM

## 2016-02-27 DIAGNOSIS — I10 Essential (primary) hypertension: Secondary | ICD-10-CM

## 2016-02-27 DIAGNOSIS — M545 Low back pain: Secondary | ICD-10-CM

## 2016-02-27 DIAGNOSIS — M25561 Pain in right knee: Secondary | ICD-10-CM | POA: Diagnosis not present

## 2016-02-27 DIAGNOSIS — E1140 Type 2 diabetes mellitus with diabetic neuropathy, unspecified: Secondary | ICD-10-CM | POA: Diagnosis not present

## 2016-02-27 DIAGNOSIS — E1165 Type 2 diabetes mellitus with hyperglycemia: Secondary | ICD-10-CM

## 2016-02-27 DIAGNOSIS — IMO0002 Reserved for concepts with insufficient information to code with codable children: Secondary | ICD-10-CM

## 2016-02-27 DIAGNOSIS — G8929 Other chronic pain: Secondary | ICD-10-CM

## 2016-02-27 MED ORDER — LOSARTAN POTASSIUM 100 MG PO TABS: 100.0000 mg | ORAL_TABLET | Freq: Every day | ORAL | 3 refills | Status: DC

## 2016-02-27 NOTE — Patient Instructions (Signed)
Medication Instructions:   Please stop the lisinopril when you run out Then change to losartan one a day  Labwork:  No new labs needed  Testing/Procedures:  No further testing at this time   I recommend watching educational videos on topics of interest to you at:       [email or web address]  Enter code: HEARTCARE    Follow-Up: It was a pleasure seeing you in the office today. Please call us if you have new issues that need to be addressed before your next appt.  [phone]  Your physician wants you to follow-up in: 12 months.  You will receive a reminder letter in the mail two months in advance. If you don't receive a letter, please call our office to schedule the follow-up appointment.  If you need a refill on your cardiac medications before your next appointment, please call your pharmacy.

## 2016-02-27 NOTE — Progress Notes (Signed)
Cardiology Office Note  Date:  02/27/2016   ID:  Casey Golden, DOB [birth year], MRN [id number]  PCP:  Ria Bush, MD   Chief Complaint  Patient presents with  . other    1 yr f/u c/o sob. Meds reviewed verbally with pt.    HPI:  Casey Golden is a very pleasant 66 year old gentleman returns today for evaluation and management of his coronary artery disease,  He has 50% mid RCA disease by cardiac catheterization in 2009 with 30% also LAD disease  He has a history of smoking for 27 years,  mixed hyperlipidemia, hypertension. he does report a syncopal episode many years ago while walking his dog. He was down for one minute. He had no warning. He has not had any further episodes since that time He reports having cardiac catheterization 3 in the past Most recently at [facility]. Report has been requested  Needs right knee surgery Did cortisone, sugars went up Needs back surgery, saw chiropractic, did PT, Active, works at [employer]  Denies any significant chest pain on exertion No significant shortness of breath. No leg edema No regular exercise program  HBA1C 7.5 (cortisone pushed it up) Trending upwards, 6.9 last year Total 112, LDL 57 LFTS stable, mildly elevated  EKG on today's visit shows no sinus rhythm with rate 67 bpm, no significant ST or T-wave changes  Cardiac catheterization report 05/13/2006 detailing normal ejection fraction, 50% mid RCA disease  cardiac catheterization November 2009 showing 40% mid RCA disease, normal ejection fraction  Other past medical history reviewed   testicular surgery 09/14/2014 in [place] with Dr. Junious Silk.  Previously had some sweating episodes at times Was previously on Symbicort but this was expensive for him, now uses albuterol as needed  He had a positive Myoview several years ago which resulted in a heart catheterization. He had nonobstructive disease. His normal left ventricular function.   PMH:   has a past medical history of  Arthritis; CAD (coronary artery disease); Colonic polyp; Fatty liver disease, nonalcoholic (123456); GERD (gastroesophageal reflux disease); History of chicken pox; Hyperlipidemia; Hypertension; Nocturia; OSA (obstructive sleep apnea); Rash of genital area; Seasonal and perennial allergic rhinitis; Type 2 diabetes mellitus ([place]); Wears dentures; and Wears glasses.  PSH:    Past Surgical History:  Procedure Laterality Date  . ABDOMINAL HERNIA REPAIR  2007      [facility]   open repair  . APPENDECTOMY  age 27  . CARDIAC CATHETERIZATION  12-23-2007   [facility]   Abnormal myoview w/ ischemia/  40% mRCA with nonobstructive and no sig. plaque in his left system, EF 55%  . CARDIAC CATHETERIZATION  Apr 2008    [facility]   Abnormal myoview/  50% RCA,  ef 65%  . CARDIAC CATHETERIZATION  1999      [facility]  . CERVICAL FUSION  1992  . CHOLECYSTECTOMY OPEN  2006  . DENTAL SURGERY     metal dental implant L mandible  . EXCISION OF SKIN TAG Right 09/14/2014   Procedure: EXCISION OF SKIN TAG;  Surgeon: Festus Aloe, MD;  Location: [facility];  Service: Urology;  Laterality: Right;  . HYDROCELE EXCISION Left 09/14/2014   Procedure: LEFT HYDROCELECTOMY ADULT;  Surgeon: Festus Aloe, MD;  Location: [facility];  Service: Urology;  Laterality: Left;  . MOHS SURGERY  2015   skin cancer  . TONSILLECTOMY  age 55    Current Outpatient Prescriptions  Medication Sig Dispense Refill  . albuterol (PROVENTIL HFA;VENTOLIN HFA) 108 (90  BASE) MCG/ACT inhaler Inhale 2 puffs into the lungs every 6 (six) hours as needed for wheezing. 1 Inhaler 2  . aspirin 81 MG EC tablet Take 1 tablet (81 mg total) by mouth daily. 30 tablet 12  . atenolol (TENORMIN) 25 MG tablet TAKE 1 TABLET BY MOUTH EVERY DAY 90 tablet 3  . atorvastatin (LIPITOR) 20 MG tablet TAKE 1 TABLET (20 MG TOTAL) BY MOUTH DAILY. 90 tablet 1  . diclofenac sodium (VOLTAREN) 1 % GEL Apply 1 application topically 3 (three) times daily. 1 Tube 1   . fenofibrate (TRICOR) 145 MG tablet TAKE 1 TABLET (145 MG TOTAL) BY MOUTH EVERY EVENING. 90 tablet 2  . gabapentin (NEURONTIN) 100 MG capsule TAKE 1 CAPSULE BY MOUTH AT BEDTIME. (Patient taking differently: TAKE 1 CAPSULE BY MOUTH AT BEDTIME ALONG WITH 300 MG TO EQUAL 400 MG) 90 capsule 3  . gabapentin (NEURONTIN) 300 MG capsule Take one capsule by mouth in the morning and one capsule by mouth at bedtime. (Patient taking differently: Take 300 mg by mouth 2 (two) times daily. Take one capsule by mouth in the morning and one capsule by mouth at bedtime ALONG WITH 100 MG TO EQUAL 400 MG) 180 capsule 3  . ibuprofen (ADVIL,MOTRIN) 200 MG tablet Take 400-600 mg by mouth every 6 (six) hours as needed for mild pain (DEPENDS ON PAIN IF TAKES 400-600 MG).     Marland Kitchen lisinopril (PRINIVIL,ZESTRIL) 10 MG tablet TAKE 1 TABLET EVERY DAY 90 tablet 3  . metFORMIN (GLUCOPHAGE) 500 MG tablet Take one tablet in am and two tablets in pm (Patient taking differently: Take 500-1,000 mg by mouth 2 (two) times daily with a meal. Take one tablet in am and two tablets in pm) 270 tablet 3   No current facility-administered medications for this visit.      Allergies:   Sulfa antibiotics   Social History:  The patient  reports that he quit smoking about 26 years ago. His smoking use included Cigarettes. He has a 70.00 pack-year smoking history. He has never used smokeless tobacco. He reports that he does not drink alcohol or use drugs.   Family History:   family history includes CAD in his mother; Cancer in his sister; Cancer (age of onset: 68) in his father; Diabetes in his mother; Heart disease in his paternal grandfather and paternal grandmother; Stroke in his mother.    Review of Systems: Review of Systems  Constitutional: Negative.   Respiratory: Negative.   Cardiovascular: Negative.   Gastrointestinal: Negative.   Musculoskeletal: Positive for back pain and joint pain.  Neurological: Negative.    Psychiatric/Behavioral: Negative.   All other systems reviewed and are negative.    PHYSICAL EXAM: VS:  BP (!) 162/70 (BP Location: Left Arm, Patient Position: Sitting, Cuff Size: Normal)   Pulse 67   Ht 5\' 7"  (1.702 m)   Wt 255 lb 8 oz (115.9 kg)   BMI 40.02 kg/m  , BMI Body mass index is 40.02 kg/m. GEN: Well nourished, well developed, in no acute distress, obese  HEENT: normal  Neck: no JVD, carotid bruits, or masses Cardiac: RRR; no murmurs, rubs, or gallops,no edema  Respiratory:  clear to auscultation bilaterally, normal work of breathing GI: soft, nontender, nondistended, + BS MS: no deformity or atrophy  Skin: warm and dry, no rash Neuro:  Strength and sensation are intact Psych: euthymic mood, full affect    Recent Labs: 09/05/2015: ALT 57; BUN 19; Creatinine, Ser 0.92; Potassium 4.8; Sodium 138  Lipid Panel Lab Results  Component Value Date   CHOL 112 03/01/2015   HDL 30.80 (L) 03/01/2015   LDLCALC 57 03/01/2015   TRIG 124.0 03/01/2015      Wt Readings from Last 3 Encounters:  02/27/16 255 lb 8 oz (115.9 kg)  01/02/16 254 lb (115.2 kg)  11/02/15 254 lb 8 oz (115.4 kg)       ASSESSMENT AND PLAN:  Coronary artery disease involving native coronary artery of native heart without angina pectoris - Plan: EKG 12-Lead Currently with no symptoms of angina. No further workup at this time. Continue current medication regimen.  Essential hypertension Elevated today [id number] systolic on repeat Will change from lisinopril to losartn 100 mg daily  Uncontrolled type 2 diabetes with neuropathy ([place]) We have encouraged continued exercise, careful diet management in an effort to lose weight.  Right knee pain, unspecified chronicity Preparing for surgery  Chronic midline low back pain, with sciatica presence unspecified Chronic pain, may need back surgery  Disposition:   F/U  12 months   Total encounter time more than 25 minutes  Greater than 50% was spent in  counseling and coordination of care with the patient    Orders Placed This Encounter  Procedures  . EKG 12-Lead     Signed, Esmond Plants, M.D., Ph.D. 02/27/2016  [place], [place]

## 2016-03-01 DIAGNOSIS — M1711 Unilateral primary osteoarthritis, right knee: Secondary | ICD-10-CM | POA: Diagnosis not present

## 2016-03-06 ENCOUNTER — Other Ambulatory Visit: Payer: Self-pay | Admitting: Family Medicine

## 2016-03-06 DIAGNOSIS — E1140 Type 2 diabetes mellitus with diabetic neuropathy, unspecified: Secondary | ICD-10-CM

## 2016-03-06 DIAGNOSIS — Z125 Encounter for screening for malignant neoplasm of prostate: Secondary | ICD-10-CM

## 2016-03-06 DIAGNOSIS — IMO0002 Reserved for concepts with insufficient information to code with codable children: Secondary | ICD-10-CM

## 2016-03-06 DIAGNOSIS — E1165 Type 2 diabetes mellitus with hyperglycemia: Principal | ICD-10-CM

## 2016-03-06 DIAGNOSIS — E1169 Type 2 diabetes mellitus with other specified complication: Secondary | ICD-10-CM

## 2016-03-06 DIAGNOSIS — E785 Hyperlipidemia, unspecified: Secondary | ICD-10-CM

## 2016-03-07 ENCOUNTER — Other Ambulatory Visit (INDEPENDENT_AMBULATORY_CARE_PROVIDER_SITE_OTHER): Payer: Managed Care, Other (non HMO)

## 2016-03-07 DIAGNOSIS — E785 Hyperlipidemia, unspecified: Secondary | ICD-10-CM | POA: Diagnosis not present

## 2016-03-07 DIAGNOSIS — E1140 Type 2 diabetes mellitus with diabetic neuropathy, unspecified: Secondary | ICD-10-CM | POA: Diagnosis not present

## 2016-03-07 DIAGNOSIS — E1165 Type 2 diabetes mellitus with hyperglycemia: Secondary | ICD-10-CM | POA: Diagnosis not present

## 2016-03-07 DIAGNOSIS — Z125 Encounter for screening for malignant neoplasm of prostate: Secondary | ICD-10-CM

## 2016-03-07 DIAGNOSIS — IMO0002 Reserved for concepts with insufficient information to code with codable children: Secondary | ICD-10-CM

## 2016-03-07 DIAGNOSIS — E1169 Type 2 diabetes mellitus with other specified complication: Secondary | ICD-10-CM | POA: Diagnosis not present

## 2016-03-07 LAB — PSA, MEDICARE: PSA: 2.69 ng/ml (ref 0.10–4.00)

## 2016-03-07 LAB — LIPID PANEL
Cholesterol: 109 mg/dL (ref 0–200)
HDL: 29.6 mg/dL — ABNORMAL LOW (ref >39.00)
LDL Cholesterol: 44 mg/dL (ref 0–99)
NonHDL: 79.43
Total CHOL/HDL Ratio: 4
Triglycerides: 175 mg/dL — ABNORMAL HIGH (ref 0.0–149.0)
VLDL: 35 mg/dL (ref 0.0–40.0)

## 2016-03-07 LAB — HEMOGLOBIN A1C: Hgb A1c MFr Bld: 8.3 % — ABNORMAL HIGH (ref 4.6–6.5)

## 2016-03-07 LAB — COMPREHENSIVE METABOLIC PANEL
ALT: 58 U/L — ABNORMAL HIGH (ref 0–53)
AST: 58 U/L — ABNORMAL HIGH (ref 0–37)
Albumin: 4.1 g/dL (ref 3.5–5.2)
Alkaline Phosphatase: 57 U/L (ref 39–117)
BUN: 17 mg/dL (ref 6–23)
CO2: 32 mEq/L (ref 19–32)
Calcium: 9.4 mg/dL (ref 8.4–10.5)
Chloride: 101 mEq/L (ref 96–112)
Creatinine, Ser: 0.88 mg/dL (ref 0.40–1.50)
GFR: 91.57 mL/min (ref >60.00)
Glucose, Bld: 218 mg/dL — ABNORMAL HIGH (ref 70–99)
Potassium: 4.6 mEq/L (ref 3.5–5.1)
Sodium: 138 mEq/L (ref 135–145)
Total Bilirubin: 0.6 mg/dL (ref 0.2–1.2)
Total Protein: 6.9 g/dL (ref 6.0–8.3)

## 2016-03-12 ENCOUNTER — Ambulatory Visit (INDEPENDENT_AMBULATORY_CARE_PROVIDER_SITE_OTHER): Payer: Managed Care, Other (non HMO) | Admitting: Family Medicine

## 2016-03-12 ENCOUNTER — Encounter: Payer: Self-pay | Admitting: Family Medicine

## 2016-03-12 VITALS — BP 136/82 | HR 70 | Temp 98.2°F | Ht 69.0 in | Wt 255.8 lb

## 2016-03-12 DIAGNOSIS — Z1211 Encounter for screening for malignant neoplasm of colon: Secondary | ICD-10-CM

## 2016-03-12 DIAGNOSIS — I10 Essential (primary) hypertension: Secondary | ICD-10-CM

## 2016-03-12 DIAGNOSIS — E1169 Type 2 diabetes mellitus with other specified complication: Secondary | ICD-10-CM

## 2016-03-12 DIAGNOSIS — Z7189 Other specified counseling: Secondary | ICD-10-CM | POA: Diagnosis not present

## 2016-03-12 DIAGNOSIS — I2510 Atherosclerotic heart disease of native coronary artery without angina pectoris: Secondary | ICD-10-CM

## 2016-03-12 DIAGNOSIS — Z6835 Body mass index (BMI) 35.0-35.9, adult: Secondary | ICD-10-CM

## 2016-03-12 DIAGNOSIS — E1140 Type 2 diabetes mellitus with diabetic neuropathy, unspecified: Secondary | ICD-10-CM

## 2016-03-12 DIAGNOSIS — E1165 Type 2 diabetes mellitus with hyperglycemia: Secondary | ICD-10-CM

## 2016-03-12 DIAGNOSIS — Z0000 Encounter for general adult medical examination without abnormal findings: Secondary | ICD-10-CM | POA: Diagnosis not present

## 2016-03-12 DIAGNOSIS — K760 Fatty (change of) liver, not elsewhere classified: Secondary | ICD-10-CM

## 2016-03-12 DIAGNOSIS — IMO0002 Reserved for concepts with insufficient information to code with codable children: Secondary | ICD-10-CM

## 2016-03-12 DIAGNOSIS — E785 Hyperlipidemia, unspecified: Secondary | ICD-10-CM

## 2016-03-12 MED ORDER — METFORMIN HCL 1000 MG PO TABS: 1000.0000 mg | ORAL_TABLET | Freq: Two times a day (BID) | ORAL | 3 refills | Status: DC

## 2016-03-12 NOTE — Assessment & Plan Note (Signed)
Anticipate fatty liver related - recheck next labwork, consider updating [place].

## 2016-03-12 NOTE — Assessment & Plan Note (Signed)
Chronic, deteriorated despite statin, fibrate. Anticipate weight gain related.

## 2016-03-12 NOTE — Assessment & Plan Note (Signed)
Advanced directive discussion - discussed. Planning on making appt with attorney.

## 2016-03-12 NOTE — Assessment & Plan Note (Signed)
Appreciate cards care. 

## 2016-03-12 NOTE — Assessment & Plan Note (Signed)
Chronic, deteriorated.  Discussed diet changes and staying active.  UTD eye exam.  Increase metformin. RTC 4 mo f/u visit DM.

## 2016-03-12 NOTE — Progress Notes (Signed)
Pre visit review using our clinic review tool, if applicable. No additional management support is needed unless otherwise documented below in the visit note. 

## 2016-03-12 NOTE — Assessment & Plan Note (Signed)
Continue to monitor with LFTs

## 2016-03-12 NOTE — Assessment & Plan Note (Signed)
Weight gain noted discussed. Discussed healthy diet and lifestyle changes to affect sustainable weight loss.

## 2016-03-12 NOTE — Assessment & Plan Note (Signed)
Chronic, stable. Continue current regimen. 

## 2016-03-12 NOTE — Assessment & Plan Note (Signed)
Preventative protocols reviewed and updated unless pt declined. Discussed healthy diet and lifestyle.  

## 2016-03-12 NOTE — Progress Notes (Signed)
BP 136/82   Pulse 70   Temp 98.2 F (36.8 C) (Oral)   Ht 5\' 9"  (1.753 m)   Wt 255 lb 12 oz (116 kg)   SpO2 97%   BMI 37.77 kg/m    CC: CPE Subjective:    Patient ID: Casey Golden, male    DOB: 25-Jun-1950, 66 y.o.   MRN: [id number]  HPI: Casey Golden is a 66 y.o. male presenting on 03/12/2016 for Annual Exam   Discussing knee and back surgeries - decided to cancel at this time. Sees Dr Rolena Infante.   Preventative: Colon cancer screening - has had colonoscopies in the past but no records available [facility]) - requests new referral today, states he received letter to reschedule colonoscopy.  Prostate cancer screening - will screen today.  Lung cancer screening - does not meet criteria Flu shot - yearly Td 2017 Pneumovax 2013, prevnar 2017 Shingles shot - 2012 Advanced directive discussion - discussed. Planning on making appt with attorney.  Seat belt use discussed Sunscreen use discussed - no changing moles on skin. Ex smoker quit 1992  Alcohol - none   Lives with wife, dog and cats  Occupation: retired, was self employed, now works at [employer]  Edu: HS Activity: walks 1.5 mi daily Diet: some water, fruits/vegetables daily  Relevant past medical, surgical, family and social history reviewed and updated as indicated. Interim medical history since our last visit reviewed. Allergies and medications reviewed and updated. Current Outpatient Prescriptions on File Prior to Visit  Medication Sig  . albuterol (PROVENTIL HFA;VENTOLIN HFA) 108 (90 BASE) MCG/ACT inhaler Inhale 2 puffs into the lungs every 6 (six) hours as needed for wheezing.  Marland Kitchen aspirin 81 MG EC tablet Take 1 tablet (81 mg total) by mouth daily.  Marland Kitchen atenolol (TENORMIN) 25 MG tablet TAKE 1 TABLET BY MOUTH EVERY DAY  . atorvastatin (LIPITOR) 20 MG tablet TAKE 1 TABLET (20 MG TOTAL) BY MOUTH DAILY.  . fenofibrate (TRICOR) 145 MG tablet TAKE 1 TABLET (145 MG TOTAL) BY MOUTH EVERY EVENING.  Marland Kitchen gabapentin (NEURONTIN) 100 MG  capsule TAKE 1 CAPSULE BY MOUTH AT BEDTIME. (Patient taking differently: TAKE 1 CAPSULE BY MOUTH AT BEDTIME ALONG WITH 300 MG TO EQUAL 400 MG)  . gabapentin (NEURONTIN) 300 MG capsule Take one capsule by mouth in the morning and one capsule by mouth at bedtime. (Patient taking differently: Take 300 mg by mouth 2 (two) times daily. Take one capsule by mouth in the morning and one capsule by mouth at bedtime ALONG WITH 100 MG TO EQUAL 400 MG)  . ibuprofen (ADVIL,MOTRIN) 200 MG tablet Take 400-600 mg by mouth every 6 (six) hours as needed for mild pain (DEPENDS ON PAIN IF TAKES 400-600 MG).   Marland Kitchen losartan (COZAAR) 100 MG tablet Take 1 tablet (100 mg total) by mouth daily.   No current facility-administered medications on file prior to visit.     Review of Systems  Constitutional: Negative for activity change, appetite change, chills, fatigue, fever and unexpected weight change.  HENT: Negative for hearing loss.   Eyes: Negative for visual disturbance.  Respiratory: Positive for shortness of breath (occasional) and wheezing. Negative for cough and chest tightness.   Cardiovascular: Positive for leg swelling (left). Negative for chest pain and palpitations.  Gastrointestinal: Negative for abdominal distention, abdominal pain, blood in stool, constipation, diarrhea, nausea and vomiting.  Genitourinary: Negative for difficulty urinating and hematuria.  Musculoskeletal: Negative for arthralgias, myalgias and neck pain.  Skin: Negative for  rash.  Neurological: Negative for dizziness, seizures, syncope and headaches.  Hematological: Negative for adenopathy. Does not bruise/bleed easily.  Psychiatric/Behavioral: Negative for dysphoric mood. The patient is not nervous/anxious.    Per HPI unless specifically indicated in ROS section     Objective:    BP 136/82   Pulse 70   Temp 98.2 F (36.8 C) (Oral)   Ht 5\' 9"  (1.753 m)   Wt 255 lb 12 oz (116 kg)   SpO2 97%   BMI 37.77 kg/m   Wt Readings from  Last 3 Encounters:  03/12/16 255 lb 12 oz (116 kg)  02/27/16 255 lb 8 oz (115.9 kg)  01/02/16 254 lb (115.2 kg)    Physical Exam  Constitutional: He is oriented to person, place, and time. He appears well-developed and well-nourished. No distress.  HENT:  Head: Normocephalic and atraumatic.  Right Ear: Hearing, tympanic membrane, external ear and ear canal normal.  Left Ear: Hearing, tympanic membrane, external ear and ear canal normal.  Nose: Nose normal.  Mouth/Throat: Uvula is midline, oropharynx is clear and moist and mucous membranes are normal. No oropharyngeal exudate, posterior oropharyngeal edema or posterior oropharyngeal erythema.  Eyes: Conjunctivae and EOM are normal. Pupils are equal, round, and reactive to light. No scleral icterus.  Neck: Normal range of motion. Neck supple. Carotid bruit is not present. No thyromegaly present.  Cardiovascular: Normal rate, regular rhythm, normal heart sounds and intact distal pulses.   No murmur heard. Pulses:      Radial pulses are 2+ on the right side, and 2+ on the left side.  Pulmonary/Chest: Effort normal and breath sounds normal. No respiratory distress. He has no wheezes. He has no rales.  Abdominal: Soft. Bowel sounds are normal. He exhibits no distension and no mass. There is no tenderness. There is no rebound and no guarding.  Genitourinary: Rectum normal and prostate normal. Rectal exam shows no external hemorrhoid, no internal hemorrhoid, no fissure, no mass, no tenderness and anal tone normal. Prostate is not enlarged (20gm) and not tender.  Musculoskeletal: Normal range of motion. He exhibits no edema.  Lymphadenopathy:    He has no cervical adenopathy.  Neurological: He is alert and oriented to person, place, and time.  CN grossly intact, station and gait intact  Skin: Skin is warm and dry. No rash noted.  Psychiatric: He has a normal mood and affect. His behavior is normal. Judgment and thought content normal.  Nursing  note and vitals reviewed.  Results for orders placed or performed in visit on 03/07/16  Comprehensive metabolic panel  Result Value Ref Range   Sodium 138 135 - 145 mEq/L   Potassium 4.6 3.5 - 5.1 mEq/L   Chloride 101 96 - 112 mEq/L   CO2 32 19 - 32 mEq/L   Glucose, Bld 218 (H) 70 - 99 mg/dL   BUN 17 6 - 23 mg/dL   Creatinine, Ser 0.88 0.40 - 1.50 mg/dL   Total Bilirubin 0.6 0.2 - 1.2 mg/dL   Alkaline Phosphatase 57 39 - 117 U/L   AST 58 (H) 0 - 37 U/L   ALT 58 (H) 0 - 53 U/L   Total Protein 6.9 6.0 - 8.3 g/dL   Albumin 4.1 3.5 - 5.2 g/dL   Calcium 9.4 8.4 - 10.5 mg/dL   GFR 91.57 >60.00 mL/min  Lipid panel  Result Value Ref Range   Cholesterol 109 0 - 200 mg/dL   Triglycerides 175.0 (H) 0.0 - 149.0 mg/dL   HDL  29.60 (L) >39.00 mg/dL   VLDL 35.0 0.0 - 40.0 mg/dL   LDL Cholesterol 44 0 - 99 mg/dL   Total CHOL/HDL Ratio 4    NonHDL 79.43   Hemoglobin A1c  Result Value Ref Range   Hgb A1c MFr Bld 8.3 (H) 4.6 - 6.5 %  PSA, Medicare  Result Value Ref Range   PSA 2.69 0.10 - 4.00 ng/ml      Assessment & Plan:   Problem List Items Addressed This Visit    Advanced care planning/counseling discussion    Advanced directive discussion - discussed. Planning on making appt with attorney.       CAD (coronary artery disease), native coronary artery    Appreciate cards care.       Dyslipidemia associated with type 2 diabetes mellitus (HCC)    Chronic, deteriorated despite statin, fibrate. Anticipate weight gain related.       Relevant Medications   metFORMIN (GLUCOPHAGE) 1000 MG tablet   Fatty liver disease, nonalcoholic    Continue to monitor with LFTs      Health maintenance examination - Primary    Preventative protocols reviewed and updated unless pt declined. Discussed healthy diet and lifestyle.       HTN (hypertension)    Chronic, stable. Continue current regimen.       Severe obesity (BMI 35.0-35.9 with comorbidity) ([place])    Weight gain noted discussed.  Discussed healthy diet and lifestyle changes to affect sustainable weight loss.      Relevant Medications   metFORMIN (GLUCOPHAGE) 1000 MG tablet   Uncontrolled type 2 diabetes with neuropathy (HCC)    Chronic, deteriorated.  Discussed diet changes and staying active.  UTD eye exam.  Increase metformin. RTC 4 mo f/u visit DM.       Relevant Medications   metFORMIN (GLUCOPHAGE) 1000 MG tablet    Other Visit Diagnoses    Special screening for malignant neoplasms, colon       Relevant Orders   Ambulatory referral to Gastroenterology       Follow up plan: Return in about 4 months (around 07/10/2016) for follow up visit.  Ria Bush, MD

## 2016-03-12 NOTE — Patient Instructions (Addendum)
We will refer you for colonoscopy.  Schedule appointment at work to set up advanced directive and bring me copy.  Sugar was too high and cholesterol levels were off - increase metformin to 1000mg  twice daily - new dose at pharmacy.  Return in 4 months for diabetes check.   Health Maintenance, Male A healthy lifestyle and preventative care can promote health and wellness.  Maintain regular health, dental, and eye exams.  Eat a healthy diet. Foods like vegetables, fruits, whole grains, low-fat dairy products, and lean protein foods contain the nutrients you need and are low in calories. Decrease your intake of foods high in solid fats, added sugars, and salt. Get information about a proper diet from your health care provider, if necessary.  Regular physical exercise is one of the most important things you can do for your health. Most adults should get at least 150 minutes of moderate-intensity exercise (any activity that increases your heart rate and causes you to sweat) each week. In addition, most adults need muscle-strengthening exercises on 2 or more days a week.   Maintain a healthy weight. The body mass index (BMI) is a screening tool to identify possible weight problems. It provides an estimate of body fat based on height and weight. Your health care provider can find your BMI and can help you achieve or maintain a healthy weight. For males 20 years and older:  A BMI below 18.5 is considered underweight.  A BMI of 18.5 to 24.9 is normal.  A BMI of 25 to 29.9 is considered overweight.  A BMI of 30 and above is considered obese.  Maintain normal blood lipids and cholesterol by exercising and minimizing your intake of saturated fat. Eat a balanced diet with plenty of fruits and vegetables. Blood tests for lipids and cholesterol should begin at age 47 and be repeated every 5 years. If your lipid or cholesterol levels are high, you are over age 56, or you are at high risk for heart disease,  you may need your cholesterol levels checked more frequently.Ongoing high lipid and cholesterol levels should be treated with medicines if diet and exercise are not working.  If you smoke, find out from your health care provider how to quit. If you do not use tobacco, do not start.  Lung cancer screening is recommended for adults aged 22-80 years who are at high risk for developing lung cancer because of a history of smoking. A yearly low-dose CT scan of the lungs is recommended for people who have at least a 30-pack-year history of smoking and are current smokers or have quit within the past 15 years. A pack year of smoking is smoking an average of 1 pack of cigarettes a day for 1 year (for example, a 30-pack-year history of smoking could mean smoking 1 pack a day for 30 years or 2 packs a day for 15 years). Yearly screening should continue until the smoker has stopped smoking for at least 15 years. Yearly screening should be stopped for people who develop a health problem that would prevent them from having lung cancer treatment.  If you choose to drink alcohol, do not have more than 2 drinks per day. One drink is considered to be 12 oz (360 mL) of beer, 5 oz (150 mL) of wine, or 1.5 oz (45 mL) of liquor.  Avoid the use of street drugs. Do not share needles with anyone. Ask for help if you need support or instructions about stopping the use of drugs.  High blood pressure causes heart disease and increases the risk of stroke. High blood pressure is more likely to develop in:  People who have blood pressure in the end of the normal range (100-139/85-89 mm Hg).  People who are overweight or obese.  People who are African American.  If you are 34-26 years of age, have your blood pressure checked every 3-5 years. If you are 54 years of age or older, have your blood pressure checked every year. You should have your blood pressure measured twice-once when you are at a hospital or clinic, and once when  you are not at a hospital or clinic. Record the average of the two measurements. To check your blood pressure when you are not at a hospital or clinic, you can use:  An automated blood pressure machine at a pharmacy.  A home blood pressure monitor.  If you are 35-51 years old, ask your health care provider if you should take aspirin to prevent heart disease.  Diabetes screening involves taking a blood sample to check your fasting blood sugar level. This should be done once every 3 years after age 31 if you are at a normal weight and without risk factors for diabetes. Testing should be considered at a younger age or be carried out more frequently if you are overweight and have at least 1 risk factor for diabetes.  Colorectal cancer can be detected and often prevented. Most routine colorectal cancer screening begins at the age of 11 and continues through age 63. However, your health care provider may recommend screening at an earlier age if you have risk factors for colon cancer. On a yearly basis, your health care provider may provide home test kits to check for hidden blood in the stool. A small camera at the end of a tube may be used to directly examine the colon (sigmoidoscopy or colonoscopy) to detect the earliest forms of colorectal cancer. Talk to your health care provider about this at age 63 when routine screening begins. A direct exam of the colon should be repeated every 5-10 years through age 15, unless early forms of precancerous polyps or small growths are found.  People who are at an increased risk for hepatitis B should be screened for this virus. You are considered at high risk for hepatitis B if:  You were born in a country where hepatitis B occurs often. Talk with your health care provider about which countries are considered high risk.  Your parents were born in a high-risk country and you have not received a shot to protect against hepatitis B (hepatitis B vaccine).  You have HIV  or AIDS.  You use needles to inject street drugs.  You live with, or have sex with, someone who has hepatitis B.  You are a man who has sex with other men (MSM).  You get hemodialysis treatment.  You take certain medicines for conditions like cancer, organ transplantation, and autoimmune conditions.  Hepatitis C blood testing is recommended for all people born from [birth year] through 1967 and any individual with known risk factors for hepatitis C.  Healthy men should no longer receive prostate-specific antigen (PSA) blood tests as part of routine cancer screening. Talk to your health care provider about prostate cancer screening.  Testicular cancer screening is not recommended for adolescents or adult males who have no symptoms. Screening includes self-exam, a health care provider exam, and other screening tests. Consult with your health care provider about any symptoms you have or any  concerns you have about testicular cancer.  Practice safe sex. Use condoms and avoid high-risk sexual practices to reduce the spread of sexually transmitted infections (STIs).  You should be screened for STIs, including gonorrhea and chlamydia if:  You are sexually active and are younger than 24 years.  You are older than 24 years, and your health care provider tells you that you are at risk for this type of infection.  Your sexual activity has changed since you were last screened, and you are at an increased risk for chlamydia or gonorrhea. Ask your health care provider if you are at risk.  If you are at risk of being infected with HIV, it is recommended that you take a prescription medicine daily to prevent HIV infection. This is called pre-exposure prophylaxis (PrEP). You are considered at risk if:  You are a man who has sex with other men (MSM).  You are a heterosexual man who is sexually active with multiple partners.  You take drugs by injection.  You are sexually active with a partner who has  HIV.  Talk with your health care provider about whether you are at high risk of being infected with HIV. If you choose to begin PrEP, you should first be tested for HIV. You should then be tested every 3 months for as long as you are taking PrEP.  Use sunscreen. Apply sunscreen liberally and repeatedly throughout the day. You should seek shade when your shadow is shorter than you. Protect yourself by wearing long sleeves, pants, a wide-brimmed hat, and sunglasses year round whenever you are outdoors.  Tell your health care provider of new moles or changes in moles, especially if there is a change in shape or color. Also, tell your health care provider if a mole is larger than the size of a pencil eraser.  A one-time screening for abdominal aortic aneurysm (AAA) and surgical repair of large AAAs by ultrasound is recommended for men aged 107-75 years who are current or former smokers.  Stay current with your vaccines (immunizations). This information is not intended to replace advice given to you by your health care provider. Make sure you discuss any questions you have with your health care provider. Document Released: 07/14/2007 Document Revised: 02/05/2014 Document Reviewed: 10/19/2014 Elsevier Interactive Patient Education  2017 [employer].

## 2016-03-14 ENCOUNTER — Encounter: Payer: Managed Care, Other (non HMO) | Admitting: Family Medicine

## 2016-04-06 ENCOUNTER — Other Ambulatory Visit: Payer: Self-pay

## 2016-04-06 ENCOUNTER — Telehealth: Payer: Self-pay

## 2016-04-06 DIAGNOSIS — Z86010 Personal history of colonic polyps: Secondary | ICD-10-CM

## 2016-04-06 NOTE — Telephone Encounter (Signed)
Gastroenterology Pre-Procedure Review  Request Date: 04/24/16 Requesting Physician: Dr. Vicente Males  PATIENT REVIEW QUESTIONS: The patient responded to the following health history questions as indicated:    1. Are you having any GI issues? no 2. Do you have a personal history of Polyps? yes (removed) 3. Do you have a family history of Colon Cancer or Polyps? no 4. Diabetes Mellitus? yes (Type II) 5. Joint replacements in the past 12 months?no 6. Major health problems in the past 3 months?no 7. Any artificial heart valves, MVP, or defibrillator?no    MEDICATIONS & ALLERGIES:    Patient reports the following regarding taking any anticoagulation/antiplatelet therapy:   Plavix, Coumadin, Eliquis, Xarelto, Lovenox, Pradaxa, Brilinta, or Effient? no Aspirin? 81mg   Patient confirms/reports the following medications:  Current Outpatient Prescriptions  Medication Sig Dispense Refill  . albuterol (PROVENTIL HFA;VENTOLIN HFA) 108 (90 BASE) MCG/ACT inhaler Inhale 2 puffs into the lungs every 6 (six) hours as needed for wheezing. 1 Inhaler 2  . aspirin 81 MG EC tablet Take 1 tablet (81 mg total) by mouth daily. 30 tablet 12  . atenolol (TENORMIN) 25 MG tablet TAKE 1 TABLET BY MOUTH EVERY DAY 90 tablet 3  . atorvastatin (LIPITOR) 20 MG tablet TAKE 1 TABLET (20 MG TOTAL) BY MOUTH DAILY. 90 tablet 1  . fenofibrate (TRICOR) 145 MG tablet TAKE 1 TABLET (145 MG TOTAL) BY MOUTH EVERY EVENING. 90 tablet 2  . gabapentin (NEURONTIN) 100 MG capsule TAKE 1 CAPSULE BY MOUTH AT BEDTIME. (Patient taking differently: TAKE 1 CAPSULE BY MOUTH AT BEDTIME ALONG WITH 300 MG TO EQUAL 400 MG) 90 capsule 3  . gabapentin (NEURONTIN) 300 MG capsule Take one capsule by mouth in the morning and one capsule by mouth at bedtime. (Patient taking differently: Take 300 mg by mouth 2 (two) times daily. Take one capsule by mouth in the morning and one capsule by mouth at bedtime ALONG WITH 100 MG TO EQUAL 400 MG) 180 capsule 3  . ibuprofen  (ADVIL,MOTRIN) 200 MG tablet Take 400-600 mg by mouth every 6 (six) hours as needed for mild pain (DEPENDS ON PAIN IF TAKES 400-600 MG).     Marland Kitchen losartan (COZAAR) 100 MG tablet Take 1 tablet (100 mg total) by mouth daily. 90 tablet 3  . metFORMIN (GLUCOPHAGE) 1000 MG tablet Take 1 tablet (1,000 mg total) by mouth 2 (two) times daily with a meal. New dose 180 tablet 3   No current facility-administered medications for this visit.     Patient confirms/reports the following allergies:  Allergies  Allergen Reactions  . Sulfa Antibiotics Hives    No orders of the defined types were placed in this encounter.   AUTHORIZATION INFORMATION Primary Insurance: 1D#: Group #:  Secondary Insurance: 1D#: Group #:  SCHEDULE INFORMATION: Date: 04/24/16 Time: Location: [place]

## 2016-04-16 ENCOUNTER — Encounter: Payer: Self-pay | Admitting: *Deleted

## 2016-04-17 ENCOUNTER — Ambulatory Visit: Payer: Managed Care, Other (non HMO) | Admitting: Anesthesiology

## 2016-04-17 ENCOUNTER — Encounter
Admission: RE | Disposition: A | Discharge status: Home / Self Care | Payer: Self-pay | Source: Ambulatory Visit | Attending: Gastroenterology

## 2016-04-17 ENCOUNTER — Encounter: Payer: Self-pay | Admitting: *Deleted

## 2016-04-17 ENCOUNTER — Ambulatory Visit
Admission: RE | Admit: 2016-04-17 | Discharge: 2016-04-17 | Disposition: A | Discharge status: Home / Self Care | Payer: Managed Care, Other (non HMO) | Source: Ambulatory Visit | Attending: Gastroenterology | Admitting: Gastroenterology

## 2016-04-17 DIAGNOSIS — D123 Benign neoplasm of transverse colon: Secondary | ICD-10-CM

## 2016-04-17 DIAGNOSIS — K641 Second degree hemorrhoids: Secondary | ICD-10-CM | POA: Diagnosis not present

## 2016-04-17 DIAGNOSIS — K5790 Diverticulosis of intestine, part unspecified, without perforation or abscess without bleeding: Secondary | ICD-10-CM | POA: Diagnosis not present

## 2016-04-17 DIAGNOSIS — D124 Benign neoplasm of descending colon: Secondary | ICD-10-CM | POA: Diagnosis not present

## 2016-04-17 DIAGNOSIS — D125 Benign neoplasm of sigmoid colon: Secondary | ICD-10-CM | POA: Diagnosis not present

## 2016-04-17 DIAGNOSIS — I10 Essential (primary) hypertension: Secondary | ICD-10-CM | POA: Insufficient documentation

## 2016-04-17 DIAGNOSIS — E785 Hyperlipidemia, unspecified: Secondary | ICD-10-CM | POA: Insufficient documentation

## 2016-04-17 DIAGNOSIS — Z87891 Personal history of nicotine dependence: Secondary | ICD-10-CM | POA: Diagnosis not present

## 2016-04-17 DIAGNOSIS — Z7982 Long term (current) use of aspirin: Secondary | ICD-10-CM | POA: Insufficient documentation

## 2016-04-17 DIAGNOSIS — K635 Polyp of colon: Secondary | ICD-10-CM

## 2016-04-17 DIAGNOSIS — K760 Fatty (change of) liver, not elsewhere classified: Secondary | ICD-10-CM | POA: Diagnosis not present

## 2016-04-17 DIAGNOSIS — K5730 Diverticulosis of large intestine without perforation or abscess without bleeding: Secondary | ICD-10-CM | POA: Diagnosis not present

## 2016-04-17 DIAGNOSIS — I2510 Atherosclerotic heart disease of native coronary artery without angina pectoris: Secondary | ICD-10-CM | POA: Diagnosis not present

## 2016-04-17 DIAGNOSIS — E119 Type 2 diabetes mellitus without complications: Secondary | ICD-10-CM | POA: Insufficient documentation

## 2016-04-17 DIAGNOSIS — Z79899 Other long term (current) drug therapy: Secondary | ICD-10-CM | POA: Insufficient documentation

## 2016-04-17 DIAGNOSIS — Z860100 Personal history of colon polyps, unspecified: Secondary | ICD-10-CM

## 2016-04-17 DIAGNOSIS — Z1211 Encounter for screening for malignant neoplasm of colon: Secondary | ICD-10-CM | POA: Diagnosis not present

## 2016-04-17 DIAGNOSIS — K648 Other hemorrhoids: Secondary | ICD-10-CM | POA: Diagnosis not present

## 2016-04-17 DIAGNOSIS — Z7984 Long term (current) use of oral hypoglycemic drugs: Secondary | ICD-10-CM | POA: Diagnosis not present

## 2016-04-17 HISTORY — PX: COLONOSCOPY WITH PROPOFOL: SHX5780

## 2016-04-17 LAB — GLUCOSE, CAPILLARY: Glucose-Capillary: 131 mg/dL — ABNORMAL HIGH (ref 65–99)

## 2016-04-17 SURGERY — COLONOSCOPY WITH PROPOFOL
Anesthesia: General

## 2016-04-17 MED ORDER — PROPOFOL 10 MG/ML IV BOLUS
INTRAVENOUS | Status: DC | PRN
  Administered 2016-04-17: 70 mg via INTRAVENOUS

## 2016-04-17 MED ORDER — SODIUM CHLORIDE 0.9 % IV SOLN
INTRAVENOUS | Status: DC
  Administered 2016-04-17: 10:00:00 via INTRAVENOUS

## 2016-04-17 MED ORDER — PROPOFOL 500 MG/50ML IV EMUL
INTRAVENOUS | Status: DC | PRN
  Administered 2016-04-17: 150 ug/kg/min via INTRAVENOUS

## 2016-04-17 MED ORDER — PROPOFOL 500 MG/50ML IV EMUL
INTRAVENOUS | Status: AC
  Filled 2016-04-17: qty 50

## 2016-04-17 MED ORDER — PROPOFOL 10 MG/ML IV BOLUS
INTRAVENOUS | Status: AC
  Filled 2016-04-17: qty 20

## 2016-04-17 NOTE — Anesthesia Preprocedure Evaluation (Signed)
Anesthesia Evaluation  Patient identified by MRN, date of birth, ID band  Reviewed: Allergy & Precautions, NPO status , Patient's Chart, lab work & pertinent test results  History of Anesthesia Complications Negative for: history of anesthetic complications  Airway Mallampati: III       Dental  (+) Partial Lower, Partial Upper   Pulmonary sleep apnea (diagnosed, no tx) , former smoker,           Cardiovascular hypertension, Pt. on medications and Pt. on home beta blockers + CAD and + Past MI       Neuro/Psych negative neurological ROS     GI/Hepatic GERD  Medicated and Controlled,  Endo/Other  diabetes, Type 2, Oral Hypoglycemic Agents  Renal/GU negative Renal ROS     Musculoskeletal   Abdominal   Peds  Hematology negative hematology ROS (+)   Anesthesia Other Findings   Reproductive/Obstetrics                             Anesthesia Physical Anesthesia Plan  ASA: III  Anesthesia Plan: General   Post-op Pain Management:    Induction:   Airway Management Planned: Nasal Cannula  Additional Equipment:   Intra-op Plan:   Post-operative Plan:   Informed Consent: I have reviewed the patients History and Physical, chart, labs and discussed the procedure including the risks, benefits and alternatives for the proposed anesthesia with the patient or authorized representative who has indicated his/her understanding and acceptance.     Plan Discussed with:   Anesthesia Plan Comments:         Anesthesia Quick Evaluation

## 2016-04-17 NOTE — Anesthesia Post-op Follow-up Note (Cosign Needed)
Anesthesia QCDR form completed.        

## 2016-04-17 NOTE — H&P (Signed)
Lucilla Lame, MD [facility] [address], [place], [place] [address] Phone:[phone] Fax : [phone]  Primary Care Physician:  Ria Bush, MD Primary Gastroenterologist:  Dr. Allen Norris  Pre-Procedure History & Physical: HPI:  Casey Golden is a 66 y.o. male is here for an colonoscopy.   Past Medical History:  Diagnosis Date  . Arthritis   . CAD (coronary artery disease)    Cardiologist--  Dr. Ida Rogue  . Colonic polyp   . Fatty liver disease, nonalcoholic 5053   by [place]  . GERD (gastroesophageal reflux disease)   . History of chicken pox   . Hyperlipidemia   . Hypertension   . Nocturia   . OSA (obstructive sleep apnea)    per pt no cpap due to sleep center/ insurance issue-- study done 2014  . Rash of genital area    09-08-2014  per pt Dr Junious Silk aware  . Seasonal and perennial allergic rhinitis   . Type 2 diabetes mellitus ([place])   . Wears dentures    full upper/  partial lower  . Wears glasses     Past Surgical History:  Procedure Laterality Date  . ABDOMINAL HERNIA REPAIR  2007      [facility]   open repair  . APPENDECTOMY  age 27  . CARDIAC CATHETERIZATION  12-23-2007   [facility]   Abnormal myoview w/ ischemia/  40% mRCA with nonobstructive and no sig. plaque in his left system, EF 55%  . CARDIAC CATHETERIZATION  Apr 2008    [facility]   Abnormal myoview/  50% RCA,  ef 65%  . CARDIAC CATHETERIZATION  1999      [facility]  . CERVICAL FUSION  1992  . CHOLECYSTECTOMY OPEN  2006  . DENTAL SURGERY     metal dental implant L mandible  . EXCISION OF SKIN TAG Right 09/14/2014   Procedure: EXCISION OF SKIN TAG;  Surgeon: Festus Aloe, MD;  Location: [facility];  Service: Urology;  Laterality: Right;  . HYDROCELE EXCISION Left 09/14/2014   Procedure: LEFT HYDROCELECTOMY ADULT;  Surgeon: Festus Aloe, MD;  Location: [facility];  Service: Urology;  Laterality: Left;  . MOHS SURGERY  2015   skin cancer  . TONSILLECTOMY  age 32     Prior to Admission medications   Medication Sig Start Date End Date Taking? Authorizing Provider  aspirin 81 MG EC tablet Take 1 tablet (81 mg total) by mouth daily. 09/17/14  Yes Festus Aloe, MD  atenolol (TENORMIN) 25 MG tablet TAKE 1 TABLET BY MOUTH EVERY DAY 09/02/15  Yes Minna Merritts, MD  atorvastatin (LIPITOR) 20 MG tablet TAKE 1 TABLET (20 MG TOTAL) BY MOUTH DAILY. 12/19/15  Yes Minna Merritts, MD  fenofibrate (TRICOR) 145 MG tablet TAKE 1 TABLET (145 MG TOTAL) BY MOUTH EVERY EVENING. 09/13/15  Yes Minna Merritts, MD  gabapentin (NEURONTIN) 100 MG capsule TAKE 1 CAPSULE BY MOUTH AT BEDTIME. Patient taking differently: TAKE 1 CAPSULE BY MOUTH AT BEDTIME ALONG WITH 300 MG TO EQUAL 400 MG 01/19/16  Yes Max T Hyatt, DPM  gabapentin (NEURONTIN) 300 MG capsule Take one capsule by mouth in the morning and one capsule by mouth at bedtime. Patient taking differently: Take 300 mg by mouth 2 (two) times daily. Take one capsule by mouth in the morning and one capsule by mouth at bedtime ALONG WITH 100 MG TO EQUAL 400 MG 01/02/16  Yes Max T Hyatt, DPM  ibuprofen (ADVIL,MOTRIN) 200 MG tablet Take 400-600  mg by mouth every 6 (six) hours as needed for mild pain (DEPENDS ON PAIN IF TAKES 400-600 MG).    Yes Historical Provider, MD  losartan (COZAAR) 100 MG tablet Take 1 tablet (100 mg total) by mouth daily. 02/27/16 05/27/16 Yes Minna Merritts, MD  albuterol (PROVENTIL HFA;VENTOLIN HFA) 108 (90 BASE) MCG/ACT inhaler Inhale 2 puffs into the lungs every 6 (six) hours as needed for wheezing. 05/12/14   Minna Merritts, MD  metFORMIN (GLUCOPHAGE) 1000 MG tablet Take 1 tablet (1,000 mg total) by mouth 2 (two) times daily with a meal. New dose 03/12/16   Ria Bush, MD    Allergies as of 04/06/2016 - Review Complete 03/12/2016  Allergen Reaction Noted  . Sulfa antibiotics Hives 05/06/2014    Family History  Problem Relation Age of Onset  . CAD Mother     MI  . Diabetes Mother   . Stroke  Mother     mini-stroke  . Cancer Father 28    lung (smoker)  . Heart disease Paternal Grandmother   . Heart disease Paternal Grandfather   . Cancer Sister     lung  . Coronary artery disease Neg Hx     Premature    Social History   Social History  . Marital status: Married    Spouse name: N/A  . Number of children: N/A  . Years of education: N/A   Occupational History  . Full time [employer]   Social History Main Topics  . Smoking status: Former Smoker    Packs/day: 2.00    Years: 35.00    Types: Cigarettes    Quit date: 01/29/1990  . Smokeless tobacco: Never Used  . Alcohol use No  . Drug use: No  . Sexual activity: Not on file   Other Topics Concern  . Not on file   Social History Narrative   Lives with wife, dog and cats   Occupation: retired, was self employed, now works at [employer]   Edu: HS    Review of Systems: See HPI, otherwise negative ROS  Physical Exam: BP 117/63   Pulse 69   Temp 97.2 F (36.2 C) (Tympanic)   Resp 14   Ht 5\' 9"  (1.753 m)   Wt 255 lb (115.7 kg)   SpO2 98%   BMI 37.66 kg/m  General:   Alert,  pleasant and cooperative in NAD Head:  Normocephalic and atraumatic. Neck:  Supple; no masses or thyromegaly. Lungs:  Clear throughout to auscultation.    Heart:  Regular rate and rhythm. Abdomen:  Soft, nontender and nondistended. Normal bowel sounds, without guarding, and without rebound.   Neurologic:  Alert and  oriented x4;  grossly normal neurologically.  Impression/Plan: Casey Golden is here for an colonoscopy to be performed for history of polyps  Risks, benefits, limitations, and alternatives regarding  colonoscopy have been reviewed with the patient.  Questions have been answered.  All parties agreeable.   Lucilla Lame, MD  04/17/2016, 10:11 AM

## 2016-04-17 NOTE — Anesthesia Procedure Notes (Signed)
Date/Time: 04/17/2016 10:20 AM Performed by: Darlyne Russian Pre-anesthesia Checklist: Patient identified, Emergency Drugs available, Suction available, Patient being monitored and Timeout performed Patient Re-evaluated:Patient Re-evaluated prior to inductionOxygen Delivery Method: Nasal cannula

## 2016-04-17 NOTE — Anesthesia Postprocedure Evaluation (Signed)
Anesthesia Post Note  Patient: Casey Golden  Procedure(s) Performed: Procedure(s) (LRB): COLONOSCOPY WITH PROPOFOL (N/A)  Patient location during evaluation: Endoscopy Anesthesia Type: General Level of consciousness: awake and alert Pain management: pain level controlled Vital Signs Assessment: post-procedure vital signs reviewed and stable Respiratory status: spontaneous breathing and respiratory function stable Cardiovascular status: stable Anesthetic complications: no     Last Vitals:  Vitals:   04/17/16 1045 04/17/16 1055  BP: (!) 103/55 115/65  Pulse: 71 70  Resp: 18 17  Temp: 36.7 C     Last Pain:  Vitals:   04/17/16 1045  TempSrc: Tympanic                 KEPHART,Ebubechukwu K

## 2016-04-17 NOTE — Op Note (Signed)
[facility] Patient Name: Casey Golden Procedure Date: 04/17/2016 10:18 AM MRN: [id number] Account #: [id number] Date of Birth: 17-Mar-1950 Admit Type: Outpatient Age: 66 Room: [facility] ENDO ROOM 4 Gender: Male Note Status: Finalized Procedure:            Colonoscopy Indications:          High risk colon cancer surveillance: Personal history                        of colonic polyps Providers:            Lucilla Lame MD, MD Referring MD:         Ria Bush (Referring MD) Medicines:            Propofol per Anesthesia Complications:        No immediate complications. Procedure:            Pre-Anesthesia Assessment:                       - Prior to the procedure, a History and Physical was                        performed, and patient medications and allergies were                        reviewed. The patient's tolerance of previous                        anesthesia was also reviewed. The risks and benefits of                        the procedure and the sedation options and risks were                        discussed with the patient. All questions were                        answered, and informed consent was obtained. Prior                        Anticoagulants: The patient has taken no previous                        anticoagulant or antiplatelet agents. ASA Grade                        Assessment: II - A patient with mild systemic disease.                        After reviewing the risks and benefits, the patient was                        deemed in satisfactory condition to undergo the                        procedure.                       After obtaining informed consent, the colonoscope was  passed under direct vision. Throughout the procedure,                        the patient's blood pressure, pulse, and oxygen                        saturations were monitored continuously. The Olympus                        CF-HQ190L  Colonoscope (S#. [id number]) was introduced                        through the anus and advanced to the the cecum,                        identified by appendiceal orifice and ileocecal valve.                        The colonoscopy was performed without difficulty. The                        patient tolerated the procedure well. The quality of                        the bowel preparation was excellent. Findings:      The perianal and digital rectal examinations were normal.      A 10 mm polyp was found in the sigmoid colon. The polyp was       pedunculated. The polyp was removed with a hot snare. Resection and       retrieval were complete.      A 3 mm polyp was found in the sigmoid colon. The polyp was sessile. The       polyp was removed with a cold snare. Resection and retrieval were       complete.      A 4 mm polyp was found in the transverse colon. The polyp was sessile.       The polyp was removed with a cold biopsy forceps. Resection and       retrieval were complete.      A 4 mm polyp was found in the descending colon. The polyp was sessile.       The polyp was removed with a cold biopsy forceps. Resection and       retrieval were complete.      A few small-mouthed diverticula were found in the entire colon.      Non-bleeding internal hemorrhoids were found during retroflexion. The       hemorrhoids were Grade II (internal hemorrhoids that prolapse but reduce       spontaneously). Impression:           - One 10 mm polyp in the sigmoid colon, removed with a                        hot snare. Resected and retrieved.                       - One 3 mm polyp in the sigmoid colon, removed with a                        cold snare.  Resected and retrieved.                       - One 4 mm polyp in the transverse colon, removed with                        a cold biopsy forceps. Resected and retrieved.                       - One 4 mm polyp in the descending colon, removed with                         a cold biopsy forceps. Resected and retrieved.                       - Diverticulosis in the entire examined colon.                       - Non-bleeding internal hemorrhoids. Recommendation:       - Discharge patient to home.                       - Resume previous diet.                       - Continue present medications.                       - Await pathology results.                       - Repeat colonoscopy in 3 years for surveillance. Procedure Code(s):    --- Professional ---                       [id number], Colonoscopy, flexible; with removal of tumor(s),                        polyp(s), or other lesion(s) by snare technique                       45380, 32, Colonoscopy, flexible; with biopsy, single                        or multiple Diagnosis Code(s):    --- Professional ---                       Z86.010, Personal history of colonic polyps                       D12.3, Benign neoplasm of transverse colon (hepatic                        flexure or splenic flexure)                       D12.5, Benign neoplasm of sigmoid colon                       D12.4, Benign neoplasm of descending colon CPT copyright 2016 American Medical Association. All rights reserved. The codes documented in this report are preliminary and upon coder review may  be revised to  meet current compliance requirements. Lucilla Lame MD, MD 04/17/2016 10:39:45 AM This report has been signed electronically. Number of Addenda: 0 Note Initiated On: 04/17/2016 10:18 AM Scope Withdrawal Time: 0 hours 11 minutes 4 seconds  Total Procedure Duration: 0 hours 12 minutes 56 seconds       [facility]

## 2016-04-17 NOTE — Transfer of Care (Signed)
Immediate Anesthesia Transfer of Care Note  Patient: Casey Golden  Procedure(s) Performed: Procedure(s): COLONOSCOPY WITH PROPOFOL (N/A)  Patient Location: PACU  Anesthesia Type:General  Level of Consciousness: awake, alert , oriented and patient cooperative  Airway & Oxygen Therapy: Patient Spontanous Breathing and Patient connected to nasal cannula oxygen  Post-op Assessment: Report given to RN and Post -op Vital signs reviewed and stable  Post vital signs: Reviewed and stable  Last Vitals:  Vitals:   04/17/16 0930 04/17/16 1045  BP: 117/63 (!) 103/55  Pulse: 69 71  Resp: 14 18  Temp: 36.2 C 36.7 C    Last Pain:  Vitals:   04/17/16 1045  TempSrc: Tympanic         Complications: No apparent anesthesia complications

## 2016-04-18 ENCOUNTER — Encounter: Payer: Self-pay | Admitting: Gastroenterology

## 2016-04-18 LAB — SURGICAL PATHOLOGY

## 2016-04-19 ENCOUNTER — Other Ambulatory Visit: Payer: Self-pay | Admitting: Cardiovascular Disease

## 2016-04-23 ENCOUNTER — Encounter: Payer: Self-pay | Admitting: Podiatry

## 2016-04-23 ENCOUNTER — Ambulatory Visit (INDEPENDENT_AMBULATORY_CARE_PROVIDER_SITE_OTHER): Payer: Managed Care, Other (non HMO) | Admitting: Podiatry

## 2016-04-23 VITALS — BP 159/96 | HR 74 | Resp 16

## 2016-04-23 DIAGNOSIS — B351 Tinea unguium: Secondary | ICD-10-CM | POA: Diagnosis not present

## 2016-04-23 DIAGNOSIS — M79676 Pain in unspecified toe(s): Secondary | ICD-10-CM | POA: Diagnosis not present

## 2016-04-23 DIAGNOSIS — E1142 Type 2 diabetes mellitus with diabetic polyneuropathy: Secondary | ICD-10-CM

## 2016-04-23 NOTE — Progress Notes (Signed)
Complaint:  Visit Type: Patient returns to my office for continued preventative foot care services. Complaint: Patient states" my nails have grown long and thick and become painful to walk and wear shoes" Patient has been diagnosed with DM with neuropathy. The patient presents for preventative foot care services. No changes to ROS  Podiatric Exam: Vascular: dorsalis pedis and posterior tibial pulses are palpable bilateral. Capillary return is immediate. Temperature gradient is WNL. Skin turgor WNL  Sensorium: Normal Semmes Weinstein monofilament test. Normal tactile sensation bilaterally. Nail Exam: Pt has thick disfigured discolored nails with subungual debris noted bilateral entire nail hallux through fifth toenails Ulcer Exam: There is no evidence of ulcer or pre-ulcerative changes or infection. Orthopedic Exam: Muscle tone and strength are WNL. No limitations in general ROM. No crepitus or effusions noted. HAV with hallux malleus. Skin: No Porokeratosis. No infection or ulcers  Diagnosis:  Onychomycosis, , Pain in right toe, pain in left toes  Treatment & Plan Procedures and Treatment: Consent by patient was obtained for treatment procedures. The patient understood the discussion of treatment and procedures well. All questions were answered thoroughly reviewed. Debridement of mycotic and hypertrophic toenails, 1 through 5 bilateral and clearing of subungual debris. No ulceration, no infection noted.  Return Visit-Office Procedure: Patient instructed to return to the office for a follow up visit 3 months for continued evaluation and treatment.    Gardiner Barefoot DPM

## 2016-04-26 DIAGNOSIS — M1711 Unilateral primary osteoarthritis, right knee: Secondary | ICD-10-CM | POA: Diagnosis not present

## 2016-05-04 ENCOUNTER — Telehealth: Payer: Self-pay

## 2016-05-04 NOTE — Telephone Encounter (Signed)
Contacted pt regarding scheduling follow up colonoscopy result. Pt will call back to schedule when he gets his calender.

## 2016-05-04 NOTE — Telephone Encounter (Signed)
-----   Message from Lucilla Lame, MD sent at 04/23/2016 12:03 PM EDT ----- Please have the patient come in for a follow up.

## 2016-05-07 NOTE — Telephone Encounter (Signed)
Pt returned call and scheduled follow up appt.

## 2016-05-10 ENCOUNTER — Other Ambulatory Visit: Payer: Self-pay | Admitting: Family Medicine

## 2016-05-10 ENCOUNTER — Ambulatory Visit (INDEPENDENT_AMBULATORY_CARE_PROVIDER_SITE_OTHER): Payer: Managed Care, Other (non HMO)

## 2016-05-10 VITALS — Ht 68.5 in | Wt 248.5 lb

## 2016-05-10 DIAGNOSIS — Z0000 Encounter for general adult medical examination without abnormal findings: Secondary | ICD-10-CM

## 2016-05-10 NOTE — Progress Notes (Signed)
AWV is not being completed. Casey Golden is primary and Medicare PART B is secondary.

## 2016-05-15 ENCOUNTER — Encounter: Payer: Self-pay | Admitting: Family Medicine

## 2016-05-15 ENCOUNTER — Ambulatory Visit (INDEPENDENT_AMBULATORY_CARE_PROVIDER_SITE_OTHER): Payer: Managed Care, Other (non HMO) | Admitting: Family Medicine

## 2016-05-15 VITALS — BP 136/78 | HR 68 | Temp 97.6°F | Wt 248.0 lb

## 2016-05-15 DIAGNOSIS — Z638 Other specified problems related to primary support group: Secondary | ICD-10-CM | POA: Diagnosis not present

## 2016-05-15 DIAGNOSIS — L989 Disorder of the skin and subcutaneous tissue, unspecified: Secondary | ICD-10-CM | POA: Insufficient documentation

## 2016-05-15 DIAGNOSIS — R21 Rash and other nonspecific skin eruption: Secondary | ICD-10-CM | POA: Diagnosis not present

## 2016-05-15 DIAGNOSIS — I2510 Atherosclerotic heart disease of native coronary artery without angina pectoris: Secondary | ICD-10-CM

## 2016-05-15 DIAGNOSIS — E1165 Type 2 diabetes mellitus with hyperglycemia: Secondary | ICD-10-CM | POA: Diagnosis not present

## 2016-05-15 DIAGNOSIS — E1140 Type 2 diabetes mellitus with diabetic neuropathy, unspecified: Secondary | ICD-10-CM

## 2016-05-15 DIAGNOSIS — IMO0002 Reserved for concepts with insufficient information to code with codable children: Secondary | ICD-10-CM

## 2016-05-15 MED ORDER — NYSTATIN 100000 UNIT/GM EX CREA: 1.0000 "application " | TOPICAL_CREAM | Freq: Three times a day (TID) | CUTANEOUS | 1 refills | Status: DC

## 2016-05-15 NOTE — Assessment & Plan Note (Signed)
Longstanding marriage discord with mistrust issues.  I have placed referral for marriage counseling.  Pt aware wife will need to agree to counseling - he states wife has agreed.

## 2016-05-15 NOTE — Patient Instructions (Addendum)
Check with pharmacy or your insurance on which glucose meter brand is preferred and let us know - we will send this in for you.  Once we have glucose meter, I would suggest checking sugars first thing in the morning before breakfast (ok for black coffee) a few times a week to get a measure of how your sugar control is doing. Goal fasting sugar 80-120.  I have placed referral for couples counseling - see Rosaria Ferries on your way out.  For groin rash - treat with nystatin cream Keep previously scheduled follow up with me in June.

## 2016-05-15 NOTE — Progress Notes (Signed)
BP 136/78   Pulse 68   Temp 97.6 F (36.4 C) (Oral)   Wt 248 lb (112.5 kg)   BMI 37.16 kg/m    CC: DM f/u visit Subjective:    Patient ID: Casey Golden, male    DOB: 1950-07-07, 66 y.o.   MRN: [id number]  HPI: Casey Golden is a 66 y.o. male presenting on 05/15/2016 for Follow-up (DM)   Recent visit with Casey Golden, no AMW at that time. He did discuss some concerns with Casey Golden and requested marriage counseling at that time. He has wanted to go through couples counseling for the past 10 yrs. Some mistrust issues. Referral placed today.   DM - regularly does not check sugars, interested in glucometer. Would like to discuss this today. Compliant with antihyperglycemic regimen which includes: metformin 1000mg  bid. Denies hypoglycemic symptoms. Denies paresthesias. Last diabetic eye exam 11/2015.  Pneumovax: 2013.  Prevnar: 2017. Lab Results  Component Value Date   HGBA1C 8.3 (H) 03/07/2016   Diabetic Foot Exam - Simple   No data filed       Chronic back and knee pain - discussing surgery with ortho vs other options.   Rash on scrotum over last 3 yrs, pruritic with intermittent burning. Treating with peroxide which helps. Casey Golden treated with some cream which helped.   COLONOSCOPY WITH PROPOFOL 04/17/2016; TAs, high grade dysplasia with margins clear, diverticulosis Casey Lame, Golden). Told to rpt 3 yrs. Has GI f/u later this month.  Relevant past medical, surgical, family and social history reviewed and updated as indicated. Interim medical history since our last visit reviewed. Allergies and medications reviewed and updated. Outpatient Medications Prior to Visit  Medication Sig Dispense Refill  . albuterol (PROVENTIL HFA;VENTOLIN HFA) 108 (90 BASE) MCG/ACT inhaler Inhale 2 puffs into the lungs every 6 (six) hours as needed for wheezing. 1 Inhaler 2  . aspirin 81 MG EC tablet Take 1 tablet (81 mg total) by mouth daily. 30 tablet 12  . atenolol (TENORMIN) 25 MG tablet TAKE 1  TABLET BY MOUTH EVERY DAY 90 tablet 3  . atorvastatin (LIPITOR) 20 MG tablet TAKE 1 TABLET (20 MG TOTAL) BY MOUTH DAILY. 90 tablet 3  . fenofibrate (TRICOR) 145 MG tablet TAKE 1 TABLET (145 MG TOTAL) BY MOUTH EVERY EVENING. 90 tablet 2  . gabapentin (NEURONTIN) 100 MG capsule TAKE 1 CAPSULE BY MOUTH AT BEDTIME. (Patient taking differently: TAKE 1 CAPSULE BY MOUTH AT BEDTIME ALONG WITH 300 MG TO EQUAL 400 MG) 90 capsule 3  . gabapentin (NEURONTIN) 300 MG capsule Take one capsule by mouth in the morning and one capsule by mouth at bedtime. (Patient taking differently: Take 300 mg by mouth 2 (two) times daily. Take one capsule by mouth in the morning and one capsule by mouth at bedtime ALONG WITH 100 MG TO EQUAL 400 MG) 180 capsule 3  . ibuprofen (ADVIL,MOTRIN) 200 MG tablet Take 400-600 mg by mouth every 6 (six) hours as needed for mild pain (DEPENDS ON PAIN IF TAKES 400-600 MG).     Marland Kitchen losartan (COZAAR) 100 MG tablet Take 1 tablet (100 mg total) by mouth daily. 90 tablet 3  . metFORMIN (GLUCOPHAGE) 1000 MG tablet Take 1 tablet (1,000 mg total) by mouth 2 (two) times daily with a meal. New dose 180 tablet 3   No facility-administered medications prior to visit.      Per HPI unless specifically indicated in ROS section below Review of Systems     Objective:  BP 136/78   Pulse 68   Temp 97.6 F (36.4 C) (Oral)   Wt 248 lb (112.5 kg)   BMI 37.16 kg/m   Wt Readings from Last 3 Encounters:  05/15/16 248 lb (112.5 kg)  05/10/16 248 lb 8 oz (112.7 kg)  04/17/16 255 lb (115.7 kg)    Physical Exam  Constitutional: He appears well-developed and well-nourished. No distress.  Cardiovascular: Normal rate, regular rhythm, normal heart sounds and intact distal pulses.   No murmur heard. Pulmonary/Chest: Effort normal and breath sounds normal. No respiratory distress. He has no wheezes. He has no rales.  Skin: Skin is warm and dry. Rash noted. There is erythema.  Erythematous macerated L scrotal  and perineal rash   Nursing note and vitals reviewed.      Assessment & Plan:   Problem List Items Addressed This Visit    Groin rash    Anticipate candidal rash - treat with nystatin cream.       Stress due to family tension    Longstanding marriage discord with mistrust issues.  I have placed referral for marriage counseling.  Pt aware wife will need to agree to counseling - he states wife has agreed.       Relevant Orders   Ambulatory referral to Psychology   Uncontrolled type 2 diabetes with neuropathy (Casey Golden) - Primary    Discussed glucose meter use and procedure to check cbg's.  rec update [place] with preferred brand and we will prescribe with strips and lancets.           Follow up plan: No Follow-up on file.  Casey Golden

## 2016-05-15 NOTE — Assessment & Plan Note (Signed)
Anticipate candidal rash - treat with nystatin cream.

## 2016-05-15 NOTE — Assessment & Plan Note (Signed)
Discussed glucose meter use and procedure to check cbg's.  rec update [place] with preferred brand and we will prescribe with strips and lancets.

## 2016-05-15 NOTE — Progress Notes (Signed)
Pre visit review using our clinic review tool, if applicable. No additional management support is needed unless otherwise documented below in the visit note. 

## 2016-05-23 ENCOUNTER — Telehealth: Payer: Self-pay | Admitting: Gastroenterology

## 2016-05-23 ENCOUNTER — Other Ambulatory Visit: Payer: Self-pay

## 2016-05-23 ENCOUNTER — Encounter: Payer: Self-pay | Admitting: Gastroenterology

## 2016-05-23 ENCOUNTER — Ambulatory Visit (INDEPENDENT_AMBULATORY_CARE_PROVIDER_SITE_OTHER): Payer: Managed Care, Other (non HMO) | Admitting: Gastroenterology

## 2016-05-23 VITALS — BP 149/81 | HR 66 | Temp 98.2°F | Ht 69.0 in | Wt 249.0 lb

## 2016-05-23 DIAGNOSIS — I2510 Atherosclerotic heart disease of native coronary artery without angina pectoris: Secondary | ICD-10-CM

## 2016-05-23 DIAGNOSIS — D126 Benign neoplasm of colon, unspecified: Secondary | ICD-10-CM | POA: Diagnosis not present

## 2016-05-23 DIAGNOSIS — Z86010 Personal history of colonic polyps: Secondary | ICD-10-CM | POA: Diagnosis not present

## 2016-05-23 NOTE — Progress Notes (Signed)
Primary Care Physician: Ria Bush, MD  Primary Gastroenterologist:  Dr. Lucilla Lame  No chief complaint on file.   HPI: Casey Golden is a 66 y.o. male here for follow-up after having a colonoscopy. The patient was found to have multiple polyps throughout the colon. The patient did have a polyp in the sigmoid colon that showed high-grade dysplasia. The patient is now here for follow-up to discuss the pathology results.  Current Outpatient Prescriptions  Medication Sig Dispense Refill  . albuterol (PROVENTIL HFA;VENTOLIN HFA) 108 (90 BASE) MCG/ACT inhaler Inhale 2 puffs into the lungs every 6 (six) hours as needed for wheezing. 1 Inhaler 2  . aspirin 81 MG EC tablet Take 1 tablet (81 mg total) by mouth daily. 30 tablet 12  . atenolol (TENORMIN) 25 MG tablet TAKE 1 TABLET BY MOUTH EVERY DAY 90 tablet 3  . atorvastatin (LIPITOR) 20 MG tablet TAKE 1 TABLET (20 MG TOTAL) BY MOUTH DAILY. 90 tablet 3  . fenofibrate (TRICOR) 145 MG tablet TAKE 1 TABLET (145 MG TOTAL) BY MOUTH EVERY EVENING. 90 tablet 2  . gabapentin (NEURONTIN) 100 MG capsule TAKE 1 CAPSULE BY MOUTH AT BEDTIME. (Patient taking differently: TAKE 1 CAPSULE BY MOUTH AT BEDTIME ALONG WITH 300 MG TO EQUAL 400 MG) 90 capsule 3  . gabapentin (NEURONTIN) 300 MG capsule Take one capsule by mouth in the morning and one capsule by mouth at bedtime. (Patient taking differently: Take 300 mg by mouth 2 (two) times daily. Take one capsule by mouth in the morning and one capsule by mouth at bedtime ALONG WITH 100 MG TO EQUAL 400 MG) 180 capsule 3  . ibuprofen (ADVIL,MOTRIN) 200 MG tablet Take 400-600 mg by mouth every 6 (six) hours as needed for mild pain (DEPENDS ON PAIN IF TAKES 400-600 MG).     Marland Kitchen losartan (COZAAR) 100 MG tablet Take 1 tablet (100 mg total) by mouth daily. 90 tablet 3  . metFORMIN (GLUCOPHAGE) 1000 MG tablet Take 1 tablet (1,000 mg total) by mouth 2 (two) times daily with a meal. New dose 180 tablet 3  . nystatin  cream (MYCOSTATIN) Apply 1 application topically 3 (three) times daily. 60 g 1   No current facility-administered medications for this visit.     Allergies as of 05/23/2016 - Review Complete 05/15/2016  Allergen Reaction Noted  . Sulfa antibiotics Hives 05/06/2014    ROS:  General: Negative for anorexia, weight loss, fever, chills, fatigue, weakness. ENT: Negative for hoarseness, difficulty swallowing , nasal congestion. CV: Negative for chest pain, angina, palpitations, dyspnea on exertion, peripheral edema.  Respiratory: Negative for dyspnea at rest, dyspnea on exertion, cough, sputum, wheezing.  GI: See history of present illness. GU:  Negative for dysuria, hematuria, urinary incontinence, urinary frequency, nocturnal urination.  Endo: Negative for unusual weight change.    Physical Examination:   There were no vitals taken for this visit.  General: Well-nourished, well-developed in no acute distress.  Eyes: No icterus. Conjunctivae pink. Mouth: Oropharyngeal mucosa moist and pink , no lesions erythema or exudate. Lungs: Clear to auscultation bilaterally. Non-labored. Heart: Regular rate and rhythm, no murmurs rubs or gallops.  Abdomen: Bowel sounds are normal, nontender, nondistended, no hepatosplenomegaly or masses, no abdominal bruits or hernia , no rebound or guarding.   Extremities: No lower extremity edema. No clubbing or deformities. Neuro: Alert and oriented x 3.  Grossly intact. Skin: Warm and dry, no jaundice.   Psych: Alert and cooperative, normal mood and affect.  Labs:  Imaging Studies: No results found.  Assessment and Plan:   Casey Golden is a 66 y.o. y/o male with a recent colonoscopy showing high-grade dysplasia. The patient will need a repeat colonoscopy in 3 months to assess completeness of the polyp removal. I have discussed risks & benefits which include, but are not limited to, bleeding, infection, perforation & drug reaction.  The patient  agrees with this plan & written consent will be obtained.       Lucilla Lame, MD. Marval Regal   Note: This dictation was prepared with Dragon dictation along with smaller phrase technology. Any transcriptional errors that result from this process are unintentional.

## 2016-05-23 NOTE — Telephone Encounter (Signed)
05/23/16 Faxed Prior Auth Form to [facility] with clinicals.

## 2016-05-24 ENCOUNTER — Other Ambulatory Visit: Payer: Self-pay | Admitting: *Deleted

## 2016-05-24 MED ORDER — GLUCOSE BLOOD VI STRP: 1.0000 | ORAL_STRIP | Freq: Every day | 1 refills | Status: DC

## 2016-05-24 MED ORDER — ONETOUCH ULTRA SYSTEM W/DEVICE KIT: 1.0000 | PACK | Freq: Once | 0 refills | Status: AC

## 2016-05-24 MED ORDER — ONETOUCH LANCETS MISC: 1 refills | Status: DC

## 2016-06-14 ENCOUNTER — Other Ambulatory Visit: Payer: Self-pay | Admitting: Family Medicine

## 2016-06-14 NOTE — Telephone Encounter (Signed)
Last office visit 05/15/2016.  Diclofenac gel is not on current medication list.  Refill?

## 2016-06-19 ENCOUNTER — Telehealth: Payer: Self-pay

## 2016-06-19 NOTE — Telephone Encounter (Signed)
Pt left v/m requesting cb with Dr Synthia Innocent opinion about letter or email  Dr Darnell Level should have received from [facility] about pts back situation. Pt request cb with Dr Synthia Innocent thoughts.

## 2016-06-20 NOTE — Telephone Encounter (Addendum)
I did receive [facility] report. It looked like the doctor agreed with surgery recommendation by ortho. I think it'd be good to continue f/u with local ortho.

## 2016-06-21 NOTE — Telephone Encounter (Signed)
Pt notified of Dr. Synthia Innocent recommendation, he verbalized understanding.

## 2016-07-10 ENCOUNTER — Telehealth: Payer: Self-pay | Admitting: *Deleted

## 2016-07-10 ENCOUNTER — Ambulatory Visit (INDEPENDENT_AMBULATORY_CARE_PROVIDER_SITE_OTHER): Payer: Managed Care, Other (non HMO) | Admitting: Family Medicine

## 2016-07-10 ENCOUNTER — Encounter: Payer: Self-pay | Admitting: Family Medicine

## 2016-07-10 VITALS — BP 132/70 | HR 63 | Temp 98.2°F | Wt 247.2 lb

## 2016-07-10 DIAGNOSIS — I2510 Atherosclerotic heart disease of native coronary artery without angina pectoris: Secondary | ICD-10-CM

## 2016-07-10 DIAGNOSIS — D125 Benign neoplasm of sigmoid colon: Secondary | ICD-10-CM

## 2016-07-10 DIAGNOSIS — E1165 Type 2 diabetes mellitus with hyperglycemia: Secondary | ICD-10-CM

## 2016-07-10 DIAGNOSIS — E1140 Type 2 diabetes mellitus with diabetic neuropathy, unspecified: Secondary | ICD-10-CM | POA: Diagnosis not present

## 2016-07-10 DIAGNOSIS — I10 Essential (primary) hypertension: Secondary | ICD-10-CM

## 2016-07-10 DIAGNOSIS — E785 Hyperlipidemia, unspecified: Secondary | ICD-10-CM

## 2016-07-10 DIAGNOSIS — E1169 Type 2 diabetes mellitus with other specified complication: Secondary | ICD-10-CM | POA: Diagnosis not present

## 2016-07-10 DIAGNOSIS — IMO0002 Reserved for concepts with insufficient information to code with codable children: Secondary | ICD-10-CM

## 2016-07-10 DIAGNOSIS — K760 Fatty (change of) liver, not elsewhere classified: Secondary | ICD-10-CM

## 2016-07-10 DIAGNOSIS — M545 Low back pain: Secondary | ICD-10-CM

## 2016-07-10 DIAGNOSIS — G4733 Obstructive sleep apnea (adult) (pediatric): Secondary | ICD-10-CM

## 2016-07-10 DIAGNOSIS — G8929 Other chronic pain: Secondary | ICD-10-CM | POA: Diagnosis not present

## 2016-07-10 LAB — COMPREHENSIVE METABOLIC PANEL
ALT: 49 U/L (ref 0–53)
AST: 45 U/L — ABNORMAL HIGH (ref 0–37)
Albumin: 4.4 g/dL (ref 3.5–5.2)
Alkaline Phosphatase: 49 U/L (ref 39–117)
BUN: 19 mg/dL (ref 6–23)
CO2: 33 mEq/L — ABNORMAL HIGH (ref 19–32)
Calcium: 9.9 mg/dL (ref 8.4–10.5)
Chloride: 103 mEq/L (ref 96–112)
Creatinine, Ser: 0.91 mg/dL (ref 0.40–1.50)
GFR: 88.01 mL/min (ref >60.00)
Glucose, Bld: 134 mg/dL — ABNORMAL HIGH (ref 70–99)
Potassium: 4.6 mEq/L (ref 3.5–5.1)
Sodium: 139 mEq/L (ref 135–145)
Total Bilirubin: 0.7 mg/dL (ref 0.2–1.2)
Total Protein: 7.5 g/dL (ref 6.0–8.3)

## 2016-07-10 LAB — LIPID PANEL
Cholesterol: 106 mg/dL (ref 0–200)
HDL: 28.1 mg/dL — ABNORMAL LOW (ref >39.00)
LDL Cholesterol: 47 mg/dL (ref 0–99)
NonHDL: 77.69
Total CHOL/HDL Ratio: 4
Triglycerides: 154 mg/dL — ABNORMAL HIGH (ref 0.0–149.0)
VLDL: 30.8 mg/dL (ref 0.0–40.0)

## 2016-07-10 LAB — HEMOGLOBIN A1C: Hgb A1c MFr Bld: 7.3 % — ABNORMAL HIGH (ref 4.6–6.5)

## 2016-07-10 MED ORDER — ZOSTER VAC RECOMB ADJUVANTED 50 MCG/0.5ML IM SUSR: 0.5000 mL | Freq: Once | INTRAMUSCULAR | 1 refills | Status: AC

## 2016-07-10 NOTE — Telephone Encounter (Signed)
Patient advised.

## 2016-07-10 NOTE — Assessment & Plan Note (Signed)
Endorses h/o OSA based on prior sleep study - I don't have records of this. He was never started on CPAP. Will refer for sleep evaluation again today.

## 2016-07-10 NOTE — Assessment & Plan Note (Signed)
Update LFT on lower tricor dose.

## 2016-07-10 NOTE — Progress Notes (Signed)
BP 132/70   Pulse 63   Temp 98.2 F (36.8 C) (Oral)   Wt 247 lb 4 oz (112.2 kg)   SpO2 97%   BMI 36.51 kg/m    CC: 4 mo f/u visit Subjective:    Patient ID: Casey Golden, male    DOB: 1951/01/19, 66 y.o.   MRN: [id number]  HPI: Jaimon Bugaj is a 66 y.o. male presenting on 07/10/2016 for 4 mos FU   Planned GI surgery next month - rpt colonoscopy.  Discussing back surgery with ortho for known spinal stenosis. Steroid shot caused hyperglycemia.   He was tested for OSA years ago, told he needed CPAP. He has not used this. Endorses night time snoring. Requests sleep referral today.   DM - regularly does not check sugars. He does have glucose meter but doesn't know how to use. Compliant with antihyperglycemic regimen which includes: metformin 1000mg  bid. Denies low sugars or hypoglycemic symptoms. Foot paresthesias. Last diabetic eye exam 11/2015. Pneumovax: 2013. Prevnar: 2017.  Lab Results  Component Value Date   HGBA1C 8.3 (H) 03/07/2016   Diabetic Foot Exam - Simple   Simple Foot Form Diabetic Foot exam was performed with the following findings:  Yes 07/10/2016  9:44 AM  Visual Inspection No deformities, no ulcerations, no other skin breakdown bilaterally:  Yes Sensation Testing See comments:  Yes Pulse Check See comments:  Yes Comments Diminished pulses L foot Diminished sensation to monofilament testing at bilateral soles      HTN - Compliant with current antihypertensive regimen of atenolol 25mg  daily, losartan 100mg  daily. Does not check blood pressures at home. No low blood pressure readings or symptoms of dizziness/syncope.  Denies HA, vision changes, CP/tightness, SOB, leg swelling.    Not fasting today.  Relevant past medical, surgical, family and social history reviewed and updated as indicated. Interim medical history since our last visit reviewed. Allergies and medications reviewed and updated. Outpatient Medications Prior to Visit  Medication Sig  Dispense Refill  . albuterol (PROVENTIL HFA;VENTOLIN HFA) 108 (90 BASE) MCG/ACT inhaler Inhale 2 puffs into the lungs every 6 (six) hours as needed for wheezing. 1 Inhaler 2  . aspirin 81 MG EC tablet Take 1 tablet (81 mg total) by mouth daily. 30 tablet 12  . atenolol (TENORMIN) 25 MG tablet TAKE 1 TABLET BY MOUTH EVERY DAY 90 tablet 3  . atorvastatin (LIPITOR) 20 MG tablet TAKE 1 TABLET (20 MG TOTAL) BY MOUTH DAILY. 90 tablet 3  . diclofenac sodium (VOLTAREN) 1 % GEL APPLY 1 APPLICATION TOPICALLY 3 (THREE) TIMES DAILY. 100 g 1  . fenofibrate (TRICOR) 145 MG tablet TAKE 1 TABLET (145 MG TOTAL) BY MOUTH EVERY EVENING. 90 tablet 2  . gabapentin (NEURONTIN) 100 MG capsule TAKE 1 CAPSULE BY MOUTH AT BEDTIME. (Patient taking differently: TAKE 1 CAPSULE BY MOUTH AT BEDTIME ALONG WITH 300 MG TO EQUAL 400 MG) 90 capsule 3  . gabapentin (NEURONTIN) 300 MG capsule Take one capsule by mouth in the morning and one capsule by mouth at bedtime. (Patient taking differently: Take 300 mg by mouth 2 (two) times daily. Take one capsule by mouth in the morning and one capsule by mouth at bedtime ALONG WITH 100 MG TO EQUAL 400 MG) 180 capsule 3  . glucose blood test strip 1 each by Other route daily. Use to check sugar once daily and as needed. Dx: E11.40, E11.65 **One Touch Ultra per insurance 100 each 1  . ibuprofen (ADVIL,MOTRIN) 200 MG tablet Take  400-600 mg by mouth every 6 (six) hours as needed for mild pain (DEPENDS ON PAIN IF TAKES 400-600 MG).     . metFORMIN (GLUCOPHAGE) 1000 MG tablet Take 1 tablet (1,000 mg total) by mouth 2 (two) times daily with a meal. New dose 180 tablet 3  . nystatin cream (MYCOSTATIN) Apply 1 application topically 3 (three) times daily. 60 g 1  . ONE TOUCH LANCETS MISC Use to check sugar once daily and as needed. Dx: E11.40, E11.65 **One Touch Ultra per insurance 100 each 1  . losartan (COZAAR) 100 MG tablet Take 1 tablet (100 mg total) by mouth daily. 90 tablet 3   No  facility-administered medications prior to visit.      Per HPI unless specifically indicated in ROS section below Review of Systems     Objective:    BP 132/70   Pulse 63   Temp 98.2 F (36.8 C) (Oral)   Wt 247 lb 4 oz (112.2 kg)   SpO2 97%   BMI 36.51 kg/m   Wt Readings from Last 3 Encounters:  07/10/16 247 lb 4 oz (112.2 kg)  05/23/16 249 lb (112.9 kg)  05/15/16 248 lb (112.5 kg)    Physical Exam  Constitutional: He appears well-developed and well-nourished. No distress.  HENT:  Head: Normocephalic and atraumatic.  Right Ear: External ear normal.  Left Ear: External ear normal.  Nose: Nose normal.  Mouth/Throat: Oropharynx is clear and moist. No oropharyngeal exudate.  Eyes: Conjunctivae and EOM are normal. Pupils are equal, round, and reactive to light. No scleral icterus.  Neck: Normal range of motion. Neck supple.  Cardiovascular: Normal rate, regular rhythm, normal heart sounds and intact distal pulses.   No murmur heard. Pulmonary/Chest: Effort normal and breath sounds normal. No respiratory distress. He has no wheezes. He has no rales.  Musculoskeletal: He exhibits no edema.  See HPI for foot exam if done  Lymphadenopathy:    He has no cervical adenopathy.  Skin: Skin is warm and dry. No rash noted.  Psychiatric: He has a normal mood and affect.  Nursing note and vitals reviewed.      Assessment & Plan:  Over 25 minutes were spent face-to-face with the patient during this encounter and >50% of that time was spent on counseling and coordination of care  Problem List Items Addressed This Visit    Chronic midline low back pain    Known severe spinal stenosis, discussing surgery with ortho.       Dyslipidemia associated with type 2 diabetes mellitus ([place])    Update FLP today on lipitor, 1/2 tricor tablet.       Relevant Orders   Lipid panel   Comprehensive metabolic panel   Fatty liver disease, nonalcoholic    Update LFT on lower tricor dose.       HTN (hypertension)    Chronic, stable. Continue current regimen.       OSA (obstructive sleep apnea)    Endorses h/o OSA based on prior sleep study - I don't have records of this. He was never started on CPAP. Will refer for sleep evaluation again today.       Polyp of sigmoid colon    Large, dysplastic. Pending rpt colonoscopy next month.       Uncontrolled type 2 diabetes with neuropathy ([facility]) - Primary    Chronic, uncontrolled Check labs today. He will take glucose meter to pharmacy for education. rec bring log to next visit.  Continue gabapentin.  Relevant Orders   Comprehensive metabolic panel   Hemoglobin A1c       Follow up plan: Return in about 3 months (around 10/10/2016) for follow up visit.  Ria Bush, MD

## 2016-07-10 NOTE — Patient Instructions (Addendum)
Labs today. We will refer you for sleep testing.  Take glucose meter to pharmacy to learn how to use. I recommend checking sugars in am before breakfast a few times a week, keep log and bring to next appointment.  Return in 3 months for follow up visit.

## 2016-07-10 NOTE — Assessment & Plan Note (Signed)
Large, dysplastic. Pending rpt colonoscopy next month.

## 2016-07-10 NOTE — Assessment & Plan Note (Signed)
Chronic, stable. Continue current regimen. 

## 2016-07-10 NOTE — Assessment & Plan Note (Signed)
Known severe spinal stenosis, discussing surgery with ortho.

## 2016-07-10 NOTE — Assessment & Plan Note (Signed)
Update FLP today on lipitor, 1/2 tricor tablet.

## 2016-07-10 NOTE — Telephone Encounter (Signed)
Pt contacted triage stating that he has checked with his pharmacy and they do have the new shingles vaccine in stock and available. He is needing a Rx sent to [facility] on [address]

## 2016-07-10 NOTE — Assessment & Plan Note (Addendum)
Chronic, uncontrolled Check labs today. He will take glucose meter to pharmacy for education. rec bring log to next visit.  Continue gabapentin.

## 2016-07-10 NOTE — Telephone Encounter (Signed)
rx sent in. 2 shot series, will need to return to pharmacy in 2 months for 2nd shot.

## 2016-07-18 DIAGNOSIS — H6123 Impacted cerumen, bilateral: Secondary | ICD-10-CM | POA: Diagnosis not present

## 2016-07-18 DIAGNOSIS — H903 Sensorineural hearing loss, bilateral: Secondary | ICD-10-CM | POA: Diagnosis not present

## 2016-08-07 ENCOUNTER — Ambulatory Visit: Payer: Managed Care, Other (non HMO) | Admitting: Anesthesiology

## 2016-08-07 ENCOUNTER — Ambulatory Visit
Admission: RE | Admit: 2016-08-07 | Discharge: 2016-08-07 | Disposition: A | Discharge status: Home / Self Care | Payer: Managed Care, Other (non HMO) | Source: Ambulatory Visit | Attending: Gastroenterology | Admitting: Gastroenterology

## 2016-08-07 ENCOUNTER — Encounter: Payer: Self-pay | Admitting: *Deleted

## 2016-08-07 ENCOUNTER — Encounter
Admission: RE | Disposition: A | Discharge status: Home / Self Care | Payer: Self-pay | Source: Ambulatory Visit | Attending: Gastroenterology

## 2016-08-07 DIAGNOSIS — Z87891 Personal history of nicotine dependence: Secondary | ICD-10-CM | POA: Diagnosis not present

## 2016-08-07 DIAGNOSIS — Z860100 Personal history of colon polyps, unspecified: Secondary | ICD-10-CM

## 2016-08-07 DIAGNOSIS — Z1211 Encounter for screening for malignant neoplasm of colon: Secondary | ICD-10-CM | POA: Diagnosis not present

## 2016-08-07 DIAGNOSIS — E119 Type 2 diabetes mellitus without complications: Secondary | ICD-10-CM | POA: Insufficient documentation

## 2016-08-07 DIAGNOSIS — K219 Gastro-esophageal reflux disease without esophagitis: Secondary | ICD-10-CM | POA: Diagnosis not present

## 2016-08-07 DIAGNOSIS — Z8249 Family history of ischemic heart disease and other diseases of the circulatory system: Secondary | ICD-10-CM | POA: Insufficient documentation

## 2016-08-07 DIAGNOSIS — Z79899 Other long term (current) drug therapy: Secondary | ICD-10-CM | POA: Insufficient documentation

## 2016-08-07 DIAGNOSIS — I10 Essential (primary) hypertension: Secondary | ICD-10-CM | POA: Diagnosis not present

## 2016-08-07 DIAGNOSIS — K635 Polyp of colon: Secondary | ICD-10-CM | POA: Diagnosis not present

## 2016-08-07 DIAGNOSIS — D125 Benign neoplasm of sigmoid colon: Secondary | ICD-10-CM | POA: Diagnosis not present

## 2016-08-07 DIAGNOSIS — Z85828 Personal history of other malignant neoplasm of skin: Secondary | ICD-10-CM | POA: Insufficient documentation

## 2016-08-07 DIAGNOSIS — M1990 Unspecified osteoarthritis, unspecified site: Secondary | ICD-10-CM | POA: Diagnosis not present

## 2016-08-07 DIAGNOSIS — I2510 Atherosclerotic heart disease of native coronary artery without angina pectoris: Secondary | ICD-10-CM | POA: Diagnosis not present

## 2016-08-07 DIAGNOSIS — E785 Hyperlipidemia, unspecified: Secondary | ICD-10-CM | POA: Diagnosis not present

## 2016-08-07 DIAGNOSIS — Z882 Allergy status to sulfonamides status: Secondary | ICD-10-CM | POA: Diagnosis not present

## 2016-08-07 DIAGNOSIS — Z7982 Long term (current) use of aspirin: Secondary | ICD-10-CM | POA: Insufficient documentation

## 2016-08-07 DIAGNOSIS — G4733 Obstructive sleep apnea (adult) (pediatric): Secondary | ICD-10-CM | POA: Diagnosis not present

## 2016-08-07 DIAGNOSIS — Z6836 Body mass index (BMI) 36.0-36.9, adult: Secondary | ICD-10-CM | POA: Diagnosis not present

## 2016-08-07 DIAGNOSIS — D126 Benign neoplasm of colon, unspecified: Secondary | ICD-10-CM | POA: Diagnosis not present

## 2016-08-07 DIAGNOSIS — K5730 Diverticulosis of large intestine without perforation or abscess without bleeding: Secondary | ICD-10-CM | POA: Diagnosis not present

## 2016-08-07 DIAGNOSIS — E669 Obesity, unspecified: Secondary | ICD-10-CM | POA: Diagnosis not present

## 2016-08-07 DIAGNOSIS — Z7984 Long term (current) use of oral hypoglycemic drugs: Secondary | ICD-10-CM | POA: Insufficient documentation

## 2016-08-07 DIAGNOSIS — K760 Fatty (change of) liver, not elsewhere classified: Secondary | ICD-10-CM | POA: Diagnosis not present

## 2016-08-07 HISTORY — PX: COLONOSCOPY WITH PROPOFOL: SHX5780

## 2016-08-07 LAB — GLUCOSE, CAPILLARY: Glucose-Capillary: 140 mg/dL — ABNORMAL HIGH (ref 65–99)

## 2016-08-07 SURGERY — COLONOSCOPY WITH PROPOFOL
Anesthesia: General

## 2016-08-07 MED ORDER — EPHEDRINE SULFATE 50 MG/ML IJ SOLN
INTRAMUSCULAR | Status: AC
  Filled 2016-08-07: qty 1

## 2016-08-07 MED ORDER — LIDOCAINE HCL (PF) 2 % IJ SOLN
INTRAMUSCULAR | Status: AC
  Filled 2016-08-07: qty 2

## 2016-08-07 MED ORDER — GLYCOPYRROLATE 0.2 MG/ML IJ SOLN
INTRAMUSCULAR | Status: DC | PRN
  Administered 2016-08-07 (×2): 0.1 mg via INTRAVENOUS

## 2016-08-07 MED ORDER — PROPOFOL 500 MG/50ML IV EMUL
INTRAVENOUS | Status: AC
  Filled 2016-08-07: qty 100

## 2016-08-07 MED ORDER — PROPOFOL 10 MG/ML IV BOLUS
INTRAVENOUS | Status: DC | PRN
  Administered 2016-08-07: 50 mg via INTRAVENOUS

## 2016-08-07 MED ORDER — GLYCOPYRROLATE 0.2 MG/ML IJ SOLN
INTRAMUSCULAR | Status: AC
  Filled 2016-08-07: qty 1

## 2016-08-07 MED ORDER — SODIUM CHLORIDE 0.9 % IV SOLN
INTRAVENOUS | Status: DC
  Administered 2016-08-07 (×2): via INTRAVENOUS

## 2016-08-07 MED ORDER — PHENYLEPHRINE HCL 10 MG/ML IJ SOLN
INTRAMUSCULAR | Status: DC | PRN
  Administered 2016-08-07 (×2): 100 ug via INTRAVENOUS

## 2016-08-07 MED ORDER — PROPOFOL 10 MG/ML IV BOLUS
INTRAVENOUS | Status: AC
  Filled 2016-08-07: qty 20

## 2016-08-07 MED ORDER — LIDOCAINE HCL (CARDIAC) 20 MG/ML IV SOLN
INTRAVENOUS | Status: DC | PRN
  Administered 2016-08-07: 100 mg via INTRAVENOUS

## 2016-08-07 MED ORDER — PHENYLEPHRINE HCL 10 MG/ML IJ SOLN
INTRAMUSCULAR | Status: AC
  Filled 2016-08-07: qty 1

## 2016-08-07 MED ORDER — PROPOFOL 500 MG/50ML IV EMUL
INTRAVENOUS | Status: DC | PRN
  Administered 2016-08-07: 120 ug/kg/min via INTRAVENOUS

## 2016-08-07 NOTE — H&P (Signed)
Lucilla Lame, MD [facility] [address] [place], [place] [address] Phone:[phone] Fax : [phone]  Primary Care Physician:  Ria Bush, MD Primary Gastroenterologist:  Dr. Allen Norris  Pre-Procedure History & Physical: HPI:  Casey Golden is a 66 y.o. male is here for an colonoscopy.   Past Medical History:  Diagnosis Date  . Arthritis   . CAD (coronary artery disease)    Cardiologist--  Dr. Ida Rogue  . Colonic polyp   . Fatty liver disease, nonalcoholic 3893   by [place]  . GERD (gastroesophageal reflux disease)   . History of chicken pox   . Hyperlipidemia   . Hypertension   . Nocturia   . OSA (obstructive sleep apnea)    per pt no cpap due to sleep center/ insurance issue-- study done 2014  . Rash of genital area    09-08-2014  per pt Dr Junious Silk aware  . Seasonal and perennial allergic rhinitis   . Type 2 diabetes mellitus ([place])   . Wears dentures    full upper/  partial lower  . Wears glasses     Past Surgical History:  Procedure Laterality Date  . ABDOMINAL HERNIA REPAIR  2007      [facility]   open repair  . APPENDECTOMY  age 59  . CARDIAC CATHETERIZATION  12-23-2007   [facility]   Abnormal myoview w/ ischemia/  40% mRCA with nonobstructive and no sig. plaque in his left system, EF 55%  . CARDIAC CATHETERIZATION  Apr 2008    [facility]   Abnormal myoview/  50% RCA,  ef 65%  . CARDIAC CATHETERIZATION  1999      [facility]  . CERVICAL FUSION  1992  . CHOLECYSTECTOMY OPEN  2006  . COLONOSCOPY WITH PROPOFOL N/A 04/17/2016   TAs, high grade dysplasia with margins clear, diverticulosis Lucilla Lame, MD)  . DENTAL SURGERY     metal dental implant L mandible  . EXCISION OF SKIN TAG Right 09/14/2014   Procedure: EXCISION OF SKIN TAG;  Surgeon: Festus Aloe, MD;  Location: [facility];  Service: Urology;  Laterality: Right;  . HYDROCELE EXCISION Left 09/14/2014   Procedure: LEFT HYDROCELECTOMY ADULT;  Surgeon: Festus Aloe, MD;  Location[facility];  Service: Urology;  Laterality: Left;  . MOHS SURGERY  2015   skin cancer  . TONSILLECTOMY  age 52    Prior to Admission medications   Medication Sig Start Date End Date Taking? Authorizing Provider  albuterol (PROVENTIL HFA;VENTOLIN HFA) 108 (90 BASE) MCG/ACT inhaler Inhale 2 puffs into the lungs every 6 (six) hours as needed for wheezing. 05/12/14  Yes Minna Merritts, MD  atenolol (TENORMIN) 25 MG tablet TAKE 1 TABLET BY MOUTH EVERY DAY 09/02/15  Yes Gollan, Kathlene November, MD  atorvastatin (LIPITOR) 20 MG tablet TAKE 1 TABLET (20 MG TOTAL) BY MOUTH DAILY. 04/20/16  Yes Gollan, Kathlene November, MD  diclofenac sodium (VOLTAREN) 1 % GEL APPLY 1 APPLICATION TOPICALLY 3 (THREE) TIMES DAILY. 06/14/16  Yes Ria Bush, MD  gabapentin (NEURONTIN) 100 MG capsule TAKE 1 CAPSULE BY MOUTH AT BEDTIME. Patient taking differently: TAKE 1 CAPSULE BY MOUTH AT BEDTIME ALONG WITH 300 MG TO EQUAL 400 MG 01/19/16  Yes Hyatt, Max T, DPM  gabapentin (NEURONTIN) 300 MG capsule Take one capsule by mouth in the morning and one capsule by mouth at bedtime. Patient taking differently: Take 300 mg by mouth 2 (two) times daily. Take one capsule by mouth in the morning and  one capsule by mouth at bedtime ALONG WITH 100 MG TO EQUAL 400 MG 01/02/16  Yes Hyatt, Max T, DPM  ibuprofen (ADVIL,MOTRIN) 200 MG tablet Take 400-600 mg by mouth every 6 (six) hours as needed for mild pain (DEPENDS ON PAIN IF TAKES 400-600 MG).    Yes [provider]  metFORMIN (GLUCOPHAGE) 1000 MG tablet Take 1 tablet (1,000 mg total) by mouth 2 (two) times daily with a meal. New dose 03/12/16  Yes Ria Bush, MD  aspirin 81 MG EC tablet Take 1 tablet (81 mg total) by mouth daily. 09/17/14   Festus Aloe, MD  glucose blood test strip 1 each by Other route daily. Use to check sugar once daily and as needed. Dx: E11.40, E11.65 **One Touch Ultra per insurance 05/24/16   Ria Bush, MD  losartan (COZAAR) 100 MG tablet  Take 1 tablet (100 mg total) by mouth daily. 02/27/16 05/27/16  Minna Merritts, MD  nystatin cream (MYCOSTATIN) Apply 1 application topically 3 (three) times daily. 05/15/16   Ria Bush, MD  ONE TOUCH LANCETS MISC Use to check sugar once daily and as needed. Dx: E11.40, E11.65 **One Touch Ultra per insurance 05/24/16   Ria Bush, MD    Allergies as of 05/23/2016 - Review Complete 05/23/2016  Allergen Reaction Noted  . Sulfa antibiotics Hives 05/06/2014    Family History  Problem Relation Age of Onset  . CAD Mother        MI  . Diabetes Mother   . Stroke Mother        mini-stroke  . Cancer Father 90       lung (smoker)  . Heart disease Paternal Grandmother   . Heart disease Paternal Grandfather   . Cancer Sister        lung  . Coronary artery disease Neg Hx        Premature    Social History   Social History  . Marital status: Married    Spouse name: N/A  . Number of children: N/A  . Years of education: N/A   Occupational History  . Full time [employer]   Social History Main Topics  . Smoking status: Former Smoker    Packs/day: 2.00    Years: 35.00    Types: Cigarettes    Quit date: 01/29/1990  . Smokeless tobacco: Never Used  . Alcohol use No  . Drug use: No  . Sexual activity: Not on file   Other Topics Concern  . Not on file   Social History Narrative   Lives with wife, dog and cats   Occupation: retired, was self employed, now works at [employer]   Edu: HS    Review of Systems: See HPI, otherwise negative ROS  Physical Exam: BP 119/62   Pulse (!) 55   Temp (!) 97 F (36.1 C) (Tympanic)   Resp 16   Ht 5\' 9"  (1.753 m)   Wt 247 lb (112 kg)   SpO2 96%   BMI 36.48 kg/m  General:   Alert,  pleasant and cooperative in NAD Head:  Normocephalic and atraumatic. Neck:  Supple; no masses or thyromegaly. Lungs:  Clear throughout to auscultation.    Heart:  Regular rate and rhythm. Abdomen:  Soft, nontender and nondistended.  Normal bowel sounds, without guarding, and without rebound.   Neurologic:  Alert and  oriented x4;  grossly normal neurologically.  Impression/Plan: Casey Golden is here for an colonoscopy to be performed for history of a  polyp with high grade sysplasia  Risks, benefits, limitations, and alternatives regarding  colonoscopy have been reviewed with the patient.  Questions have been answered.  All parties agreeable.   Lucilla Lame, MD  08/07/2016, 7:53 AM

## 2016-08-07 NOTE — Anesthesia Postprocedure Evaluation (Signed)
Anesthesia Post Note  Patient: Casey Golden  Procedure(s) Performed: Procedure(s) (LRB): COLONOSCOPY WITH PROPOFOL (N/A)  Patient location during evaluation: Endoscopy Anesthesia Type: General Level of consciousness: awake and alert and oriented Pain management: pain level controlled Vital Signs Assessment: post-procedure vital signs reviewed and stable Respiratory status: spontaneous breathing, nonlabored ventilation and respiratory function stable Cardiovascular status: blood pressure returned to baseline and stable Postop Assessment: no signs of nausea or vomiting Anesthetic complications: no     Last Vitals:  Vitals:   08/07/16 0846 08/07/16 0856  BP: 111/72 129/62  Pulse: 66 64  Resp: 14 15  Temp:      Last Pain:  Vitals:   08/07/16 0826  TempSrc: Tympanic                 [name]

## 2016-08-07 NOTE — Transfer of Care (Signed)
Immediate Anesthesia Transfer of Care Note  Patient: Casey Golden  Procedure(s) Performed: Procedure(s): COLONOSCOPY WITH PROPOFOL (N/A)  Patient Location: Endoscopy Unit  Anesthesia Type:General  Level of Consciousness: drowsy and patient cooperative  Airway & Oxygen Therapy: Patient Spontanous Breathing and Patient connected to nasal cannula oxygen  Post-op Assessment: Report given to RN, Post -op Vital signs reviewed and stable and Patient moving all extremities X 4  Post vital signs: Reviewed and stable  Last Vitals:  Vitals:   08/07/16 0709  BP: 119/62  Pulse: (!) 55  Resp: 16  Temp: (!) 36.1 C    Last Pain:  Vitals:   08/07/16 0709  TempSrc: Tympanic         Complications: No apparent anesthesia complications

## 2016-08-07 NOTE — Op Note (Signed)
[facility] Gastroenterology Patient Name: Casey Golden Procedure Date: 08/07/2016 7:21 AM MRN: [id number] Account #: [id number] Date of Birth: 1950-07-18 Admit Type: Outpatient Age: 66 Room: [facility] ENDO ROOM 4 Gender: Male Note Status: Finalized Procedure:            Colonoscopy Indications:          High risk colon cancer surveillance: Personal history                        of adenoma with high grade dysplasia Providers:            Lucilla Lame MD, MD Referring MD:         Ria Bush (Referring MD) Medicines:            Propofol per Anesthesia Complications:        No immediate complications. Procedure:            Pre-Anesthesia Assessment:                       - Prior to the procedure, a History and Physical was                        performed, and patient medications and allergies were                        reviewed. The patient's tolerance of previous                        anesthesia was also reviewed. The risks and benefits of                        the procedure and the sedation options and risks were                        discussed with the patient. All questions were                        answered, and informed consent was obtained. Prior                        Anticoagulants: The patient has taken no previous                        anticoagulant or antiplatelet agents. ASA Grade                        Assessment: II - A patient with mild systemic disease.                        After reviewing the risks and benefits, the patient was                        deemed in satisfactory condition to undergo the                        procedure.                       After obtaining informed consent, the colonoscope was  passed under direct vision. Throughout the procedure,                        the patient's blood pressure, pulse, and oxygen                        saturations were monitored continuously. The   Colonoscope was introduced through the anus and                        advanced to the the cecum, identified by appendiceal                        orifice and ileocecal valve. The colonoscopy was                        performed without difficulty. The patient tolerated the                        procedure well. The quality of the bowel preparation                        was excellent. Findings:      The perianal and digital rectal examinations were normal.      A 2 mm polyp was found in the sigmoid colon. The polyp was sessile. The       polyp was removed with a cold biopsy forceps. Resection and retrieval       were complete.      A 5 mm polyp was found in the sigmoid colon. The polyp was pedunculated.       The polyp was removed with a cold snare. Resection and retrieval were       complete.      A few small-mouthed diverticula were found in the sigmoid colon. Impression:           - One 2 mm polyp in the sigmoid colon, removed with a                        cold biopsy forceps. Resected and retrieved.                       - One 5 mm polyp in the sigmoid colon, removed with a                        cold snare. Resected and retrieved.                       - Diverticulosis in the sigmoid colon. Recommendation:       - Discharge patient to home.                       - Resume previous diet.                       - Continue present medications.                       - Await pathology results.                       - Repeat colonoscopy in 3 years for  surveillance. Procedure Code(s):    --- Professional ---                       [phone], Colonoscopy, flexible; with removal of tumor(s),                        polyp(s), or other lesion(s) by snare technique                       [id number], 43, Colonoscopy, flexible; with biopsy, single                        or multiple Diagnosis Code(s):    --- Professional ---                       Z86.010, Personal history of colonic polyps                        D12.5, Benign neoplasm of sigmoid colon CPT copyright 2016 American Medical Association. All rights reserved. The codes documented in this report are preliminary and upon coder review may  be revised to meet current compliance requirements. Lucilla Lame MD, MD 08/07/2016 8:24:59 AM This report has been signed electronically. Number of Addenda: 0 Note Initiated On: 08/07/2016 7:21 AM Scope Withdrawal Time: 0 hours 10 minutes 50 seconds  Total Procedure Duration: 0 hours 13 minutes 24 seconds       [facility]

## 2016-08-07 NOTE — Anesthesia Preprocedure Evaluation (Signed)
Anesthesia Evaluation  Patient identified by MRN, date of birth, ID band Patient awake    Reviewed: Allergy & Precautions, NPO status , Patient's Chart, lab work & pertinent test results  History of Anesthesia Complications Negative for: history of anesthetic complications  Airway Mallampati: III  TM Distance: >3 FB Neck ROM: Full    Dental  (+) Upper Dentures, Partial Lower   Pulmonary sleep apnea , neg COPD, former smoker,    breath sounds clear to auscultation- rhonchi (-) wheezing      Cardiovascular hypertension, Pt. on medications + CAD  (-) Cardiac Stents and (-) CABG  Rhythm:Regular Rate:Normal - Systolic murmurs and - Diastolic murmurs    Neuro/Psych negative neurological ROS  negative psych ROS   GI/Hepatic Neg liver ROS, GERD  ,  Endo/Other  diabetes, Oral Hypoglycemic Agents  Renal/GU negative Renal ROS     Musculoskeletal  (+) Arthritis ,   Abdominal (+) + obese,   Peds  Hematology negative hematology ROS (+)   Anesthesia Other Findings Past Medical History: No date: Arthritis No date: CAD (coronary artery disease)     Comment: Cardiologist--  Dr. Ida Rogue No date: Colonic polyp 2015: Fatty liver disease, nonalcoholic     Comment: by [place] No date: GERD (gastroesophageal reflux disease) No date: History of chicken pox No date: Hyperlipidemia No date: Hypertension No date: Nocturia No date: OSA (obstructive sleep apnea)     Comment: per pt no cpap due to sleep center/ insurance               issue-- study done 2014 No date: Rash of genital area     Comment: 09-08-2014  per pt Dr Junious Silk aware No date: Seasonal and perennial allergic rhinitis No date: Type 2 diabetes mellitus ([place]) No date: Wears dentures     Comment: full upper/  partial lower No date: Wears glasses   Reproductive/Obstetrics                             Anesthesia Physical Anesthesia  Plan  ASA: III  Anesthesia Plan: General   Post-op Pain Management:    Induction: Intravenous  PONV Risk Score and Plan: 1 and Propofol  Airway Management Planned: Natural Airway  Additional Equipment:   Intra-op Plan:   Post-operative Plan:   Informed Consent: I have reviewed the patients History and Physical, chart, labs and discussed the procedure including the risks, benefits and alternatives for the proposed anesthesia with the patient or authorized representative who has indicated his/her understanding and acceptance.   Dental advisory given  Plan Discussed with: CRNA and Anesthesiologist  Anesthesia Plan Comments:         Anesthesia Quick Evaluation

## 2016-08-07 NOTE — Anesthesia Post-op Follow-up Note (Cosign Needed)
Anesthesia QCDR form completed.        

## 2016-08-08 ENCOUNTER — Encounter: Payer: Self-pay | Admitting: Gastroenterology

## 2016-08-08 LAB — SURGICAL PATHOLOGY

## 2016-08-20 ENCOUNTER — Ambulatory Visit: Payer: Managed Care, Other (non HMO) | Admitting: Psychology

## 2016-09-12 ENCOUNTER — Other Ambulatory Visit: Payer: Self-pay | Admitting: Family Medicine

## 2016-09-17 ENCOUNTER — Encounter: Payer: Self-pay | Admitting: Podiatry

## 2016-09-17 ENCOUNTER — Ambulatory Visit (INDEPENDENT_AMBULATORY_CARE_PROVIDER_SITE_OTHER): Payer: Managed Care, Other (non HMO) | Admitting: Podiatry

## 2016-09-17 DIAGNOSIS — M79676 Pain in unspecified toe(s): Secondary | ICD-10-CM | POA: Diagnosis not present

## 2016-09-17 DIAGNOSIS — M2011 Hallux valgus (acquired), right foot: Secondary | ICD-10-CM

## 2016-09-17 DIAGNOSIS — M2030 Hallux varus (acquired), unspecified foot: Secondary | ICD-10-CM

## 2016-09-17 DIAGNOSIS — B351 Tinea unguium: Secondary | ICD-10-CM

## 2016-09-17 DIAGNOSIS — M2012 Hallux valgus (acquired), left foot: Secondary | ICD-10-CM

## 2016-09-17 DIAGNOSIS — E1142 Type 2 diabetes mellitus with diabetic polyneuropathy: Secondary | ICD-10-CM

## 2016-09-17 NOTE — Progress Notes (Signed)
Complaint:  Visit Type: Patient returns to my office for continued preventative foot care services. Complaint: Patient states" my nails have grown long and thick and become painful to walk and wear shoes" Patient has been diagnosed with DM with neuropathy. The patient presents for preventative foot care services. No changes to ROS  Podiatric Exam: Vascular: dorsalis pedis and posterior tibial pulses are palpable bilateral. Capillary return is immediate. Temperature gradient is WNL. Skin turgor WNL  Sensorium: Absent  Semmes Weinstein monofilament test. Diminished  tactile sensation bilaterally. Nail Exam: Pt has thick disfigured discolored nails with subungual debris noted bilateral entire nail hallux through fifth toenails Ulcer Exam: There is no evidence of ulcer or pre-ulcerative changes or infection. Orthopedic Exam: Muscle tone and strength are WNL. No limitations in general ROM. No crepitus or effusions noted. HAV with hallux malleus. Tailors Bunion  B/L Skin: No Porokeratosis. No infection or ulcers  Diagnosis:  Onychomycosis, , Pain in right toe, pain in left toes,  DPN  HAV with hallux malleus.  Tailors bunion  B/L  Treatment & Plan Procedures and Treatment: Consent by patient was obtained for treatment procedures. The patient understood the discussion of treatment and procedures well. All questions were answered thoroughly reviewed. Debridement of mycotic and hypertrophic toenails, 1 through 5 bilateral and clearing of subungual debris. No ulceration, no infection noted. Initiate diabetic footwear paperwork for DPN, HAV  With tailors bunion.  Return Visit-Office Procedure: Patient instructed to return to the office for a follow up visit 3 months for continued evaluation and treatment.    Bryona Foxworthy DPM 

## 2016-09-18 DIAGNOSIS — M5136 Other intervertebral disc degeneration, lumbar region: Secondary | ICD-10-CM | POA: Diagnosis not present

## 2016-10-02 DIAGNOSIS — M1711 Unilateral primary osteoarthritis, right knee: Secondary | ICD-10-CM | POA: Diagnosis not present

## 2016-10-05 ENCOUNTER — Other Ambulatory Visit: Payer: Self-pay | Admitting: Cardiovascular Disease

## 2016-10-08 DIAGNOSIS — N5201 Erectile dysfunction due to arterial insufficiency: Secondary | ICD-10-CM | POA: Diagnosis not present

## 2016-10-08 DIAGNOSIS — N400 Enlarged prostate without lower urinary tract symptoms: Secondary | ICD-10-CM | POA: Diagnosis not present

## 2016-10-18 DIAGNOSIS — Z23 Encounter for immunization: Secondary | ICD-10-CM | POA: Diagnosis not present

## 2016-10-24 DIAGNOSIS — N5201 Erectile dysfunction due to arterial insufficiency: Secondary | ICD-10-CM | POA: Diagnosis not present

## 2016-10-25 ENCOUNTER — Encounter: Payer: Self-pay | Admitting: Family Medicine

## 2016-10-25 ENCOUNTER — Ambulatory Visit (INDEPENDENT_AMBULATORY_CARE_PROVIDER_SITE_OTHER): Payer: Managed Care, Other (non HMO) | Admitting: Podiatry

## 2016-10-25 ENCOUNTER — Ambulatory Visit (INDEPENDENT_AMBULATORY_CARE_PROVIDER_SITE_OTHER): Payer: Managed Care, Other (non HMO) | Admitting: Family Medicine

## 2016-10-25 VITALS — BP 124/76 | HR 75 | Temp 97.8°F | Wt 242.0 lb

## 2016-10-25 DIAGNOSIS — E1165 Type 2 diabetes mellitus with hyperglycemia: Secondary | ICD-10-CM

## 2016-10-25 DIAGNOSIS — IMO0002 Reserved for concepts with insufficient information to code with codable children: Secondary | ICD-10-CM

## 2016-10-25 DIAGNOSIS — E1140 Type 2 diabetes mellitus with diabetic neuropathy, unspecified: Secondary | ICD-10-CM | POA: Diagnosis not present

## 2016-10-25 DIAGNOSIS — I2510 Atherosclerotic heart disease of native coronary artery without angina pectoris: Secondary | ICD-10-CM

## 2016-10-25 DIAGNOSIS — M2030 Hallux varus (acquired), unspecified foot: Secondary | ICD-10-CM

## 2016-10-25 DIAGNOSIS — G4733 Obstructive sleep apnea (adult) (pediatric): Secondary | ICD-10-CM

## 2016-10-25 DIAGNOSIS — I10 Essential (primary) hypertension: Secondary | ICD-10-CM | POA: Diagnosis not present

## 2016-10-25 LAB — HEMOGLOBIN A1C: Hgb A1c MFr Bld: 6.6 % — ABNORMAL HIGH (ref 4.6–6.5)

## 2016-10-25 LAB — MICROALBUMIN / CREATININE URINE RATIO
Creatinine,U: 92.9 mg/dL
Microalb Creat Ratio: 0.8 mg/g (ref 0.0–30.0)
Microalb, Ur: <0.7 mg/dL (ref 0.0–1.9)

## 2016-10-25 LAB — BASIC METABOLIC PANEL
BUN: 15 mg/dL (ref 6–23)
CO2: 32 mEq/L (ref 19–32)
Calcium: 9.9 mg/dL (ref 8.4–10.5)
Chloride: 99 mEq/L (ref 96–112)
Creatinine, Ser: 0.89 mg/dL (ref 0.40–1.50)
GFR: 90.22 mL/min (ref >60.00)
Glucose, Bld: 122 mg/dL — ABNORMAL HIGH (ref 70–99)
Potassium: 4.3 mEq/L (ref 3.5–5.1)
Sodium: 137 mEq/L (ref 135–145)

## 2016-10-25 MED ORDER — DICLOFENAC SODIUM 1 % TD GEL: 2.0000 g | Freq: Three times a day (TID) | TRANSDERMAL | 1 refills | Status: DC | PRN

## 2016-10-25 MED ORDER — ALBUTEROL SULFATE HFA 108 (90 BASE) MCG/ACT IN AERS: 2.0000 | INHALATION_SPRAY | Freq: Four times a day (QID) | RESPIRATORY_TRACT | 2 refills | Status: DC | PRN

## 2016-10-25 MED ORDER — NYSTATIN 100000 UNIT/GM EX CREA: 1.0000 "application " | TOPICAL_CREAM | Freq: Three times a day (TID) | CUTANEOUS | 1 refills | Status: DC

## 2016-10-25 NOTE — Assessment & Plan Note (Signed)
Chronic, stable. Continue current regimen. 

## 2016-10-25 NOTE — Assessment & Plan Note (Signed)
Will refer for rpt sleep evaluation. I don't see records of prior sleep study in chart. Will refer to neurology.

## 2016-10-25 NOTE — Patient Instructions (Addendum)
Glucose meter training today.  Labs today Urine check today for protein.  Return in 3 months for recheck diabetes.

## 2016-10-25 NOTE — Assessment & Plan Note (Addendum)
Overall improving, update labs today. Continue metformin 1000mg  bid.  I asked my CMA to review glucose meter use with patient.

## 2016-10-25 NOTE — Progress Notes (Addendum)
BP 124/76 (BP Location: Left Arm, Patient Position: Sitting, Cuff Size: Normal)   Pulse 75   Temp 97.8 F (36.6 C) (Oral)   Wt 242 lb (109.8 kg)   SpO2 97%   BMI 35.74 kg/m    CC: med refill visit Subjective:    Patient ID: Casey Golden, male    DOB: 1950-03-06, 66 y.o.   MRN: [id number]  HPI: Casey Golden is a 67 y.o. male presenting on 10/25/2016 for Medication Refill ( Wants to discuss Voltaren and nystatin)   Saw urologist Dr Junious Silk.  5 lb weight loss.  He had flu and pneumonia shots. Chart updated.  Requests voltaren and nystatin refills.  Has decided to postpone back surgery for known spinal stenosis.   Requests return to sleep doctor for eval OSA. Endorses h/o abnormal sleep study but he never was started on CPAP. Bad experience with [facility].   DM - does not regularly check sugars. He has been drinking okra infused water which he feels is helping lower his sugars. Compliant with antihyperglycemic regimen which includes: metformin 1085m bid. Denies low sugars or hypoglycemic symptoms. Denies paresthesias. Last diabetic eye exam 11/2015. Pneumovax: completed. Prevnar: completed. Glucometer brand: one touch ultra - saw pharmacist without benefit. DSME: not completed Lab Results  Component Value Date   HGBA1C 7.3 (H) 07/10/2016   Diabetic Foot Exam - Simple   No data filed     No results found for: MDerl [place]  Relevant past medical, surgical, family and social history reviewed and updated as indicated. Interim medical history since our last visit reviewed. Allergies and medications reviewed and updated. Outpatient Medications Prior to Visit  Medication Sig Dispense Refill  . aspirin 81 MG EC tablet Take 1 tablet (81 mg total) by mouth daily. 30 tablet 12  . atenolol (TENORMIN) 25 MG tablet TAKE 1 TABLET BY MOUTH EVERY DAY 90 tablet 3  . atorvastatin (LIPITOR) 20 MG tablet TAKE 1 TABLET (20 MG TOTAL) BY MOUTH DAILY. 90 tablet 3  . gabapentin  (NEURONTIN) 100 MG capsule TAKE 1 CAPSULE BY MOUTH AT BEDTIME. (Patient taking differently: TAKE 1 CAPSULE BY MOUTH AT BEDTIME ALONG WITH 300 MG TO EQUAL 400 MG) 90 capsule 3  . gabapentin (NEURONTIN) 300 MG capsule Take one capsule by mouth in the morning and one capsule by mouth at bedtime. (Patient taking differently: Take 300 mg by mouth 2 (two) times daily. Take one capsule by mouth in the morning and one capsule by mouth at bedtime ALONG WITH 100 MG TO EQUAL 400 MG) 180 capsule 3  . glucose blood test strip 1 each by Other route daily. Use to check sugar once daily and as needed. Dx: E11.40, E11.65 **One Touch Ultra per insurance 100 each 1  . ibuprofen (ADVIL,MOTRIN) 200 MG tablet Take 400-600 mg by mouth every 6 (six) hours as needed for mild pain (DEPENDS ON PAIN IF TAKES 400-600 MG).     . metFORMIN (GLUCOPHAGE) 1000 MG tablet Take 1 tablet (1,000 mg total) by mouth 2 (two) times daily with a meal. New dose 180 tablet 3  . ONE TOUCH LANCETS MISC Use to check sugar once daily and as needed. Dx: E11.40, E11.65 **One Touch Ultra per insurance 100 each 1  . albuterol (PROVENTIL HFA;VENTOLIN HFA) 108 (90 BASE) MCG/ACT inhaler Inhale 2 puffs into the lungs every 6 (six) hours as needed for wheezing. 1 Inhaler 2  . diclofenac sodium (VOLTAREN) 1 % GEL APPLY TO AFFECTED AREA 3  TIMES A DAY 100 g 1  . metFORMIN (GLUCOPHAGE) 500 MG tablet TAKE 1 TABLET IN THE MORNING AND 2 TABLETS IN THE EVENING  3  . nystatin cream (MYCOSTATIN) Apply 1 application topically 3 (three) times daily. 60 g 1  . SUPREP BOWEL PREP KIT 17.5-3.13-1.6 GM/180ML SOLN     . losartan (COZAAR) 100 MG tablet Take 1 tablet (100 mg total) by mouth daily. 90 tablet 3   No facility-administered medications prior to visit.      Per HPI unless specifically indicated in ROS section below Review of Systems     Objective:    BP 124/76 (BP Location: Left Arm, Patient Position: Sitting, Cuff Size: Normal)   Pulse 75   Temp 97.8 F  (36.6 C) (Oral)   Wt 242 lb (109.8 kg)   SpO2 97%   BMI 35.74 kg/m   Wt Readings from Last 3 Encounters:  10/25/16 242 lb (109.8 kg)  08/07/16 247 lb (112 kg)  07/10/16 247 lb 4 oz (112.2 kg)    Physical Exam  Constitutional: He appears well-developed and well-nourished. No distress.  HENT:  Head: Normocephalic and atraumatic.  Right Ear: External ear normal.  Left Ear: External ear normal.  Nose: Nose normal.  Mouth/Throat: Oropharynx is clear and moist. No oropharyngeal exudate.  Eyes: Pupils are equal, round, and reactive to light. Conjunctivae and EOM are normal. No scleral icterus.  Neck: Normal range of motion. Neck supple.  Cardiovascular: Normal rate, regular rhythm, normal heart sounds and intact distal pulses.   No murmur heard. Pulmonary/Chest: Effort normal and breath sounds normal. No respiratory distress. He has no wheezes. He has no rales.  Musculoskeletal: He exhibits no edema.  See HPI for foot exam if done  Lymphadenopathy:    He has no cervical adenopathy.  Skin: Skin is warm and dry. No rash noted.  Psychiatric: He has a normal mood and affect.  Nursing note and vitals reviewed.      Assessment & Plan:  Over 25 minutes were spent face-to-face with the patient during this encounter and >50% of that time was spent on counseling and coordination of care  Problem List Items Addressed This Visit    HTN (hypertension)    Chronic, stable. Continue current regimen       OSA (obstructive sleep apnea)    Will refer for rpt sleep evaluation. I don't see records of prior sleep study in chart. Will refer to neurology.       Relevant Orders   Ambulatory referral to Neurology   Uncontrolled type 2 diabetes with neuropathy ([place]) - Primary    Overall improving, update labs today. Continue metformin 1072m bid.  I asked my CMA to review glucose meter use with patient.       Relevant Orders   Microalbumin / creatinine urine ratio   Basic metabolic panel    Hemoglobin A1c       Follow up plan: Return in about 3 months (around 01/24/2017) for follow up visit.  JRia Bush MD

## 2016-10-25 NOTE — Addendum Note (Signed)
Addended by: Ria Bush on: 10/25/2016 10:45 AM   Modules accepted: Orders, Level of Service

## 2016-10-25 NOTE — Progress Notes (Signed)
This patient presents the office to pick up his newly ordered diabetic shoes.  Diabetic neuropathy.  HAV bilaterally.  Hallux malleus  Patient was brought into the treatment room and his 1 she was found to be too tight and to fit to snugly.  Therefore was determined that the shoes be sent back in and wider she will be reordered.  Patient will be called when the new shoes a lot.   Gardiner Barefoot DPM

## 2016-10-30 ENCOUNTER — Other Ambulatory Visit: Payer: Self-pay

## 2016-11-14 ENCOUNTER — Encounter: Payer: Managed Care, Other (non HMO) | Admitting: Orthotics

## 2016-11-21 ENCOUNTER — Ambulatory Visit: Payer: Self-pay

## 2016-11-21 ENCOUNTER — Telehealth: Payer: Self-pay

## 2016-11-21 ENCOUNTER — Ambulatory Visit: Payer: Managed Care, Other (non HMO) | Admitting: Orthotics

## 2016-11-21 DIAGNOSIS — E1142 Type 2 diabetes mellitus with diabetic polyneuropathy: Secondary | ICD-10-CM

## 2016-11-21 DIAGNOSIS — Z0189 Encounter for other specified special examinations: Secondary | ICD-10-CM

## 2016-11-21 DIAGNOSIS — E119 Type 2 diabetes mellitus without complications: Secondary | ICD-10-CM

## 2016-11-21 NOTE — Progress Notes (Signed)
Patient came in to pick up Diabetic shoes...fit well, but didn't like the way they sounded when he walked.   Reordered Apex [id number]

## 2016-11-21 NOTE — Telephone Encounter (Signed)
Triaged pt. with c/o dizziness x 2 days, and described a pressure on top of head today; rated 1-2/10.  Stated the dizziness is "mild" today.  Denied feeling faint.  Denied any change in vision, speech difficulty, weakness of extremities, or confusion. Denied shortness of breath or chest pain.  Reported blood sugar was 110 this AM, fasting.  Blood sugar 176 @ 12:15 PM; reported had eaten approx. 30 minutes prior.  Reported BP of 173/81, pulse 71.  Advised to see MD 10/25, at 9:15 AM.  Strongly advised to go to the ER if symptoms worsen.  Encouraged to have someone drive him to the appt. tomorrow, due to dizziness.    Reason for Disposition . [1] MILD dizziness (e.g., walking normally) AND [2] has NOT been evaluated by physician for this  (Exception: dizziness caused by heat exposure, sudden standing, or poor fluid intake)  Answer Assessment - Initial Assessment Questions 1. DESCRIPTION: "Describe your dizziness."     Yesterday, I felt like my sugar dropped.  But it feels a little different today; It feels like a pressure on my head; I woke up with a light headache this morning.  Rated head pressure at 1-2/10 2. LIGHTHEADED: "Do you feel lightheaded?" (e.g., somewhat faint, woozy, weak upon standing)     Denies feeling faint.  But stated the dizziness is worse when standing 3. VERTIGO: "Do you feel like either you or the room is spinning or tilting?" (i.e. vertigo)     no 4. SEVERITY: "How bad is it?"  "Do you feel like you are going to faint?" "Can you stand and walk?"   - MILD - walking normally   - MODERATE - interferes with normal activities (e.g., work, school)    - SEVERE - unable to stand, requires support to walk, feels like passing out now.      Mild; reported he felt worse this AM, when he 1st got up. 5. ONSET:  "When did the dizziness begin?"     Yesterday, but it felt more like my blood sugar was low.  Today, my blood sugar was 110 before I ate anything.  6. AGGRAVATING FACTORS: "Does  anything make it worse?" (e.g., standing, change in head position)     No, I haven't noticed. 7. HEART RATE: "Can you tell me your heart rate?" "How many beats in 15 seconds?"  (Note: not all patients can do this)       Pulse 71; BP 173/81 @ 12:00 PM; denies feeling any heart palpitations. 8. CAUSE: "What do you think is causing the dizziness?"     Doesn't know 9. RECURRENT SYMPTOM: "Have you had dizziness before?" If so, ask: "When was the last time?" "What happened that time?"     Not like this 10. OTHER SYMPTOMS: "Do you have any other symptoms?" (e.g., fever, chest pain, vomiting, diarrhea, bleeding)      "Head feels a little funny"; also reported "a little nausea", denies vomiting. Denies any weakness of extremities; alert and oriented.  11. PREGNANCY: "Is there any chance you are pregnant?" "When was your last menstrual period?"       n/a  Protocols used: DIZZINESS [facility]

## 2016-11-21 NOTE — Telephone Encounter (Signed)
Pt has appt with Dr Darnell Level on 11/22/16 at 9:15; please also see nurse triage note 11/21/16.

## 2016-11-21 NOTE — Telephone Encounter (Signed)
Noted. Will see tomorrow. 

## 2016-11-21 NOTE — Telephone Encounter (Signed)
-----   Message from Denman George, RN sent at 11/21/2016 12:35 PM EDT ----- Regarding: triage call Please see Triage note today; I scheduled pt. an appt. in the AM with Dr. Darnell Level.  Although dizziness is reported as "mild", I am concerned about "pressure on top of head."  I advised him to go to the ER if symptoms worsen.

## 2016-11-22 ENCOUNTER — Encounter: Payer: Self-pay | Admitting: Family Medicine

## 2016-11-22 ENCOUNTER — Ambulatory Visit (INDEPENDENT_AMBULATORY_CARE_PROVIDER_SITE_OTHER): Payer: Managed Care, Other (non HMO) | Admitting: Family Medicine

## 2016-11-22 VITALS — BP 120/78 | HR 61 | Temp 98.2°F | Wt 240.5 lb

## 2016-11-22 DIAGNOSIS — R51 Headache: Secondary | ICD-10-CM | POA: Diagnosis not present

## 2016-11-22 DIAGNOSIS — R519 Headache, unspecified: Secondary | ICD-10-CM | POA: Insufficient documentation

## 2016-11-22 DIAGNOSIS — I2510 Atherosclerotic heart disease of native coronary artery without angina pectoris: Secondary | ICD-10-CM

## 2016-11-22 NOTE — Patient Instructions (Signed)
Good to see you today. You are doing well today.  Next time this happens if at home, check blood pressure to ensure not too low or too high. If abnormal, let us know.  If ongoing dizziness, could try decreasing metformin to 1/2 tablet at bedtime.  Let us know how you do.

## 2016-11-22 NOTE — Progress Notes (Signed)
BP 120/78 (BP Location: Left Arm, Patient Position: Sitting, Cuff Size: Large)   Pulse 61   Temp 98.2 F (36.8 C) (Oral)   Wt 240 lb 8 oz (109.1 kg)   SpO2 96%   BMI 35.52 kg/m    CC: dizziness Subjective:    Patient ID: Casey Golden, male    DOB: 04/26/1950, 66 y.o.   MRN: [id number]  HPI: Casey Golden is a 66 y.o. male presenting on 11/22/2016 for Dizziness (Started 2-3 days ago upon awakening. Worse in AM. Also, had pressure in top of head. Says this morning sxs are moderate)   3d h/o pressure on top of head as well as mild dizziness, first noticed while buying groceries Tuesday morning. Seems to notice dizziness more in the mornings. Doesn't describe vertigo, syncope or presyncope. No true headache, dyspnea, palpitations. No unilateral weakness, numbness, or slurred speech or syncope.   Sugars 110-160s. No low sugars noted.   Relevant past medical, surgical, family and social history reviewed and updated as indicated. Interim medical history since our last visit reviewed. Allergies and medications reviewed and updated. Outpatient Medications Prior to Visit  Medication Sig Dispense Refill  . albuterol (PROVENTIL HFA;VENTOLIN HFA) 108 (90 Base) MCG/ACT inhaler Inhale 2 puffs into the lungs every 6 (six) hours as needed for wheezing. 1 Inhaler 2  . aspirin 81 MG EC tablet Take 1 tablet (81 mg total) by mouth daily. 30 tablet 12  . atenolol (TENORMIN) 25 MG tablet TAKE 1 TABLET BY MOUTH EVERY DAY 90 tablet 3  . atorvastatin (LIPITOR) 20 MG tablet TAKE 1 TABLET (20 MG TOTAL) BY MOUTH DAILY. 90 tablet 3  . diclofenac sodium (VOLTAREN) 1 % GEL Apply 2 g topically 3 (three) times daily as needed (anti inflammatory). 100 g 1  . gabapentin (NEURONTIN) 100 MG capsule TAKE 1 CAPSULE BY MOUTH AT BEDTIME. (Patient taking differently: TAKE 1 CAPSULE BY MOUTH AT BEDTIME ALONG WITH 300 MG TO EQUAL 400 MG) 90 capsule 3  . gabapentin (NEURONTIN) 300 MG capsule Take one capsule by mouth in  the morning and one capsule by mouth at bedtime. (Patient taking differently: Take 300 mg by mouth 2 (two) times daily. Take one capsule by mouth in the morning and one capsule by mouth at bedtime ALONG WITH 100 MG TO EQUAL 400 MG) 180 capsule 3  . glucose blood test strip 1 each by Other route daily. Use to check sugar once daily and as needed. Dx: E11.40, E11.65 **One Touch Ultra per insurance 100 each 1  . ibuprofen (ADVIL,MOTRIN) 200 MG tablet Take 400-600 mg by mouth every 6 (six) hours as needed for mild pain (DEPENDS ON PAIN IF TAKES 400-600 MG).     . metFORMIN (GLUCOPHAGE) 1000 MG tablet Take 1 tablet (1,000 mg total) by mouth 2 (two) times daily with a meal. New dose 180 tablet 3  . nystatin cream (MYCOSTATIN) Apply 1 application topically 3 (three) times daily. (Antifungal) 60 g 1  . ONE TOUCH LANCETS MISC Use to check sugar once daily and as needed. Dx: E11.40, E11.65 **One Touch Ultra per insurance 100 each 1  . losartan (COZAAR) 100 MG tablet Take 1 tablet (100 mg total) by mouth daily. 90 tablet 3   No facility-administered medications prior to visit.      Per HPI unless specifically indicated in ROS section below Review of Systems     Objective:    BP 120/78 (BP Location: Left Arm, Patient Position: Sitting, Cuff Size:  Large)   Pulse 61   Temp 98.2 F (36.8 C) (Oral)   Wt 240 lb 8 oz (109.1 kg)   SpO2 96%   BMI 35.52 kg/m   Wt Readings from Last 3 Encounters:  11/22/16 240 lb 8 oz (109.1 kg)  10/25/16 242 lb (109.8 kg)  08/07/16 247 lb (112 kg)    Physical Exam  Constitutional: He is oriented to person, place, and time. He appears well-developed and well-nourished. No distress.  HENT:  Mouth/Throat: Oropharynx is clear and moist. No oropharyngeal exudate.  Eyes: Pupils are equal, round, and reactive to light. EOM are normal.  Neck: Normal range of motion. Neck supple. No thyromegaly present.  Cardiovascular: Normal rate, regular rhythm, normal heart sounds and  intact distal pulses.   No murmur heard. Pulmonary/Chest: Effort normal and breath sounds normal. No respiratory distress. He has no wheezes. He has no rales.  Musculoskeletal: He exhibits no edema.  Lymphadenopathy:    He has no cervical adenopathy.  Neurological: He is alert and oriented to person, place, and time. He has normal strength. No cranial nerve deficit or sensory deficit. He displays a negative Romberg sign. Coordination and gait normal.  CN 2-12 intact FTN intact EOMI No pronator drift  Skin: Skin is warm and dry. No rash noted.  Psychiatric: He has a normal mood and affect.  Nursing note and vitals reviewed.  Results for orders placed or performed in visit on 10/25/16  Microalbumin / creatinine urine ratio  Result Value Ref Range   Microalb, Ur <0.7 0.0 - 1.9 mg/dL   Creatinine,U 92.9 mg/dL   Microalb Creat Ratio 0.8 0.0 - 30.0 mg/g  Basic metabolic panel  Result Value Ref Range   Sodium 137 135 - 145 mEq/L   Potassium 4.3 3.5 - 5.1 mEq/L   Chloride 99 96 - 112 mEq/L   CO2 32 19 - 32 mEq/L   Glucose, Bld 122 (H) 70 - 99 mg/dL   BUN 15 6 - 23 mg/dL   Creatinine, Ser 0.89 0.40 - 1.50 mg/dL   Calcium 9.9 8.4 - 10.5 mg/dL   GFR 90.22 >60.00 mL/min  Hemoglobin A1c  Result Value Ref Range   Hgb A1c MFr Bld 6.6 (H) 4.6 - 6.5 %   Orthostatics normal today.     Assessment & Plan:   Problem List Items Addressed This Visit    Pressure in head - Primary    Nonspecific symptoms of pressure in head and mild lighteadedness noted. Benign exam today.  Symptoms more noticeable in ams.  ?hypoglycemia related although no documented lows - okra infused water may be contributing - suggested if ongoing symptoms to decrease night time metformin to 500mg .  ?hypotension related - I asked him to check BP next time symptoms develop to rule this out.           Follow up plan: Return if symptoms worsen or fail to improve.  Ria Bush, MD

## 2016-11-22 NOTE — Assessment & Plan Note (Signed)
Nonspecific symptoms of pressure in head and mild lighteadedness noted. Benign exam today.  Symptoms more noticeable in ams.  ?hypoglycemia related although no documented lows - okra infused water may be contributing - suggested if ongoing symptoms to decrease night time metformin to 500mg .  ?hypotension related - I asked him to check BP next time symptoms develop to rule this out.

## 2016-11-28 ENCOUNTER — Other Ambulatory Visit: Payer: Self-pay | Admitting: Family Medicine

## 2016-11-28 ENCOUNTER — Ambulatory Visit (INDEPENDENT_AMBULATORY_CARE_PROVIDER_SITE_OTHER): Payer: Managed Care, Other (non HMO) | Admitting: Orthotics

## 2016-11-28 DIAGNOSIS — E1142 Type 2 diabetes mellitus with diabetic polyneuropathy: Secondary | ICD-10-CM

## 2016-11-28 DIAGNOSIS — M2030 Hallux varus (acquired), unspecified foot: Secondary | ICD-10-CM | POA: Diagnosis not present

## 2016-11-28 DIAGNOSIS — E119 Type 2 diabetes mellitus without complications: Secondary | ICD-10-CM | POA: Diagnosis not present

## 2016-11-28 DIAGNOSIS — Z0189 Encounter for other specified special examinations: Secondary | ICD-10-CM

## 2016-11-28 NOTE — Progress Notes (Signed)

## 2016-12-04 ENCOUNTER — Encounter: Payer: Self-pay | Admitting: Neurology

## 2016-12-04 ENCOUNTER — Ambulatory Visit (INDEPENDENT_AMBULATORY_CARE_PROVIDER_SITE_OTHER): Payer: Managed Care, Other (non HMO) | Admitting: Neurology

## 2016-12-04 VITALS — BP 150/75 | HR 61 | Ht 69.0 in | Wt 240.5 lb

## 2016-12-04 DIAGNOSIS — G4719 Other hypersomnia: Secondary | ICD-10-CM | POA: Diagnosis not present

## 2016-12-04 DIAGNOSIS — E669 Obesity, unspecified: Secondary | ICD-10-CM | POA: Diagnosis not present

## 2016-12-04 DIAGNOSIS — R351 Nocturia: Secondary | ICD-10-CM

## 2016-12-04 DIAGNOSIS — R609 Edema, unspecified: Secondary | ICD-10-CM

## 2016-12-04 DIAGNOSIS — G4733 Obstructive sleep apnea (adult) (pediatric): Secondary | ICD-10-CM

## 2016-12-04 DIAGNOSIS — I2510 Atherosclerotic heart disease of native coronary artery without angina pectoris: Secondary | ICD-10-CM

## 2016-12-04 NOTE — Patient Instructions (Signed)
Thank you for choosing [facility] for your sleep related care! It was nice to meet you today! I appreciate that you entrust me with your sleep related healthcare concerns. I hope, I was able to address at least some of your concerns today, and that I can help you feel reassured and also get better.    Here is what we discussed today and what we came up with as our plan for you:    Based on your symptoms and your exam I believe you are at risk for obstructive sleep apnea or OSA, and I think we should proceed with a sleep study to determine whether you do or do not have OSA and how severe it is. If you have more than mild OSA, I want you to consider treatment with CPAP. Please remember, the risks and ramifications of moderate to severe obstructive sleep apnea or OSA are: Cardiovascular disease, including congestive heart failure, stroke, difficult to control hypertension, arrhythmias, and even type 2 diabetes has been linked to untreated OSA. Sleep apnea causes disruption of sleep and sleep deprivation in most cases, which, in turn, can cause recurrent headaches, problems with memory, mood, concentration, focus, and vigilance. Most people with untreated sleep apnea report excessive daytime sleepiness, which can affect their ability to drive. Please do not drive if you feel sleepy.   I will likely see you back after your sleep study to go over the test results and where to go from there. We will call you after your sleep study to advise about the results (most likely, you will hear from Kristen, my nurse) and to set up an appointment at the time, as necessary.    Our sleep lab administrative assistant, Dawn will call you to schedule your sleep study. If you don't hear back from her by about 2 weeks from now, please feel free to call her at [phone]. You can leave a message with your phone number and concerns, if you get the voicemail box. She will call back as soon as possible.   

## 2016-12-04 NOTE — Progress Notes (Signed)
Subjective:    Patient ID: Casey Golden is a 66 y.o. male.  HPI     Casey Age, MD, PhD [facility] [address] [address] [place], [place] [address]  Dear Dr. Danise Golden,  I saw your patient, Casey Golden, upon your kind request in my neurologic clinic today for initial consultation of his sleep disorder, in particular, reevaluation of his prior diagnosis of OSA. The patient is unaccompanied today. As you know, Casey Golden is a 66 year old right-handed gentleman with an underlying medical history of type 2 diabetes, allergic rhinitis, hypertension, hyperlipidemia, reflux disease, fatty liver, coronary artery disease, arthritis and obesity who was previously diagnosed with obstructive sleep apnea. I reviewed his prior study results from 2015. He had a baseline sleep study on 07/29/2013 which showed an AHI of 11.3 per hour, 37.9 per hour during REM sleep. REM latency was 50 minutes, sleep efficiency was 80.3%. Average oxygen saturation was 96.8%, nadir was 72.6%. He had a PLM index of 51.2 with an associated arousal index of 0.5 per hour. He had a CPAP titration study on 09/16/2013. He was titrated from 5 cm to 10 cm. His optimal pressure was deemed 8 cm. His BMI at that time was 32. I reviewed your office note from 11/22/2016. He has not actually been on CPAP therapy. I reviewed your office note from 10/25/2016 as well. His Epworth sleepiness score is 10 out of 24 on the fatigue score is 15 out of 63. He lives at home with his wife. They have 3 children. He works at [employer] He quit smoking in 1990 and does not typically drink alcohol, drinks caffeine in the form of coffee, 5 cups a day on average. His bedtime is typically around 11. By that time he has typically already taken a short nap while watching TV. His wake up time is around 5, he helps out with his grandchildren, taking them to school. He denies restless leg symptoms. He has neuropathy secondary to  diabetes and is on gabapentin. He has a history of pneumonia twice or 3 times in his life, also bronchitis. He denies morning headaches but has nocturia about once or twice per average night.  His Past Medical History Is Significant For: Past Medical History:  Diagnosis Date  . Arthritis   . CAD (coronary artery disease)    Cardiologist--  Dr. Ida Rogue  . Colonic polyp   . Fatty liver disease, nonalcoholic 5400   by [place]  . GERD (gastroesophageal reflux disease)   . History of chicken pox   . Hyperlipidemia   . Hypertension   . Nocturia   . OSA (obstructive sleep apnea)    per pt no cpap due to sleep center/ insurance issue-- study done 2014  . Rash of genital area    09-08-2014  per pt Dr Junious Silk aware  . Seasonal and perennial allergic rhinitis   . Type 2 diabetes mellitus ([place])   . Wears dentures    full upper/  partial lower  . Wears glasses     His Past Surgical History Is Significant For: Past Surgical History:  Procedure Laterality Date  . ABDOMINAL HERNIA REPAIR  2007      [facility]   open repair  . APPENDECTOMY  Golden 52  . CARDIAC CATHETERIZATION  12-23-2007   [facility]   Abnormal myoview w/ ischemia/  40% mRCA with nonobstructive and no sig. plaque in his left system, EF 55%  . CARDIAC CATHETERIZATION  Apr 2008  [facility]   Abnormal myoview/  50% RCA,  ef 65%  . CARDIAC CATHETERIZATION  1999      [facility]  . CERVICAL FUSION  1992  . CHOLECYSTECTOMY OPEN  2006  . DENTAL SURGERY     metal dental implant L mandible  . MOHS SURGERY  2015   skin cancer  . TONSILLECTOMY  Golden 21    His Family History Is Significant For: Family History  Problem Relation Golden of Onset  . CAD Mother        MI  . Diabetes Mother   . Stroke Mother        mini-stroke  . Cancer Father 2       lung (smoker)  . Heart disease Paternal Grandmother   . Heart disease Paternal Grandfather   . Cancer Sister        lung  . Coronary artery disease Neg Hx        Premature    His Social History  Is Significant For: Social History   Socioeconomic History  . Marital status: Married    Spouse name: None  . Number of children: None  . Years of education: None  . Highest education level: None  Social Needs  . Financial resource strain: None  . Food insecurity - worry: None  . Food insecurity - inability: None  . Transportation needs - medical: None  . Transportation needs - non-medical: None  Occupational History  . Occupation: Full time    Employer: [employer]  Tobacco Use  . Smoking status: Former Smoker    Packs/day: 2.00    Years: 35.00    Pack years: 70.00    Types: Cigarettes    Last attempt to quit: 01/29/1990    Years since quitting: 26.8  . Smokeless tobacco: Never Used  Substance and Sexual Activity  . Alcohol use: No  . Drug use: No  . Sexual activity: None  Other Topics Concern  . None  Social History Narrative   Lives with wife, dog and cats   Occupation: retired, was self employed, now works at [employer]   Edu: HS    His Allergies Are:  Allergies  Allergen Reactions  . Sulfa Antibiotics Hives  :  His Current Medications Are:  Outpatient Encounter Medications as of 12/04/2016  Medication Sig  . albuterol (PROVENTIL HFA;VENTOLIN HFA) 108 (90 Base) MCG/ACT inhaler Inhale 2 puffs into the lungs every 6 (six) hours as needed for wheezing.  Marland Kitchen aspirin 81 MG EC tablet Take 1 tablet (81 mg total) by mouth daily.  Marland Kitchen atenolol (TENORMIN) 25 MG tablet TAKE 1 TABLET BY MOUTH EVERY DAY  . atorvastatin (LIPITOR) 20 MG tablet TAKE 1 TABLET (20 MG TOTAL) BY MOUTH DAILY.  Marland Kitchen diclofenac sodium (VOLTAREN) 1 % GEL Apply 2 g topically 3 (three) times daily as needed (anti inflammatory).  . gabapentin (NEURONTIN) 100 MG capsule TAKE 1 CAPSULE BY MOUTH AT BEDTIME. (Patient taking differently: TAKE 1 CAPSULE BY MOUTH AT BEDTIME ALONG WITH 300 MG TO EQUAL 400 MG)  . gabapentin (NEURONTIN) 300 MG capsule Take one capsule by mouth in the morning and one capsule by  mouth at bedtime. (Patient taking differently: Take 300 mg by mouth 2 (two) times daily. Take one capsule by mouth in the morning and one capsule by mouth at bedtime ALONG WITH 100 MG TO EQUAL 400 MG)  . ibuprofen (ADVIL,MOTRIN) 200 MG tablet Take 400-600 mg by mouth every 6 (six) hours as needed for mild  pain (DEPENDS ON PAIN IF TAKES 400-600 MG).   . metFORMIN (GLUCOPHAGE) 1000 MG tablet Take 1 tablet (1,000 mg total) by mouth 2 (two) times daily with a meal. New dose  . nystatin cream (MYCOSTATIN) Apply 1 application topically 3 (three) times daily. (Antifungal)  . ONE TOUCH LANCETS MISC Use to check sugar once daily and as needed. Dx: E11.40, E11.65 **One Touch Ultra per insurance  . ONE TOUCH ULTRA TEST test strip USE TO CHECK SUGAR ONCE DAILY AND AS NEEDED  . losartan (COZAAR) 100 MG tablet Take 1 tablet (100 mg total) by mouth daily.   No facility-administered encounter medications on file as of 12/04/2016.   :  Review of Systems:  Out of a complete 14 point review of systems, all are reviewed and negative with the exception of these symptoms as listed below:  Review of Systems  Neurological:       Patient had a sleep study done a few years ago but states that he did not have a very good experience and has not followed up since.   Epworth Sleepiness Scale 0= would never doze 1= slight chance of dozing 2= moderate chance of dozing 3= high chance of dozing  Sitting and reading: 2 Watching TV: 2 Sitting inactive in a public place (ex. Theater or meeting): 3  As a passenger in a car for an hour without a break: 0 Lying down to rest in the afternoon: 3  Sitting and talking to someone: 0 Sitting quietly after lunch (no alcohol): 0 In a car, while stopped in traffic: 0 Total: 10     Objective:  Neurological Exam  Physical Exam Physical Examination:   Vitals:   12/04/16 1056  BP: (!) 150/75  Pulse: 61   General Examination: The patient is a very pleasant 66 y.o. male in  no acute distress. He appears well-developed and well-nourished and well groomed.   HEENT: Normocephalic, atraumatic, pupils are equal, round and reactive to light and accommodation. Corrective eye glasses. Extraocular tracking is good without limitation to gaze excursion or nystagmus noted. Normal smooth pursuit is noted. Hearing is grossly intact. Face is symmetric with normal facial animation and normal facial sensation. Speech is clear with no dysarthria noted. There is no hypophonia. There is no lip, neck/head, jaw or voice tremor. Neck is supple with full range of passive and active motion. There are no carotid bruits on auscultation. Oropharynx exam reveals: mild mouth dryness, adequate dental hygiene with full dentures on top and partials on the bottom, and moderate airway crowding, due to redundant soft palate. Mallampati is class II. Tongue protrudes centrally and palate elevates symmetrically. Tonsils are absent. Neck size is 19 1/8 inches.   Chest: Clear to auscultation without wheezing, rhonchi or crackles noted.  Heart: S1+S2+0, regular and normal without murmurs, rubs or gallops noted.   Abdomen: Soft, non-tender and non-distended with normal bowel sounds appreciated on auscultation.  Extremities: There is trace pitting edema in the distal lower extremity on the R.   Skin: Warm and dry without trophic changes noted.  Musculoskeletal: exam reveals no obvious joint deformities, tenderness or joint swelling or erythema.   Neurologically:  Mental status: The patient is awake, alert and oriented in all 4 spheres. His immediate and remote memory, attention, language skills and fund of knowledge are appropriate. There is no evidence of aphasia, agnosia, apraxia or anomia. Speech is clear with normal prosody and enunciation. Thought process is linear. Mood is normal and affect is normal.  Cranial nerves II - XII are as described above under HEENT exam. In addition: shoulder shrug is normal  with equal shoulder height noted. Motor exam: Normal bulk, strength and tone is noted. There is no drift, tremor or rebound. Romberg is negative. Reflexes are 1+ throughout. Fine motor skills and coordination: intact with normal finger taps, normal hand movements, normal rapid alternating patting, normal foot taps and normal foot agility.  Cerebellar testing: No dysmetria or intention tremor on finger to nose testing. Heel to shin is unremarkable bilaterally. There is no truncal or gait ataxia.  Sensory exam: intact to light touch, pinprick, vibration, temp in the upper and lower extremities.  Gait, station and balance: He stands with difficulty. No veering to one side is noted. Posture with increase in lumbar kyphosis and tilt to R. Gait is slow and cautious. Tandem walk is not tested for safety.   Assessment and Plan:  In summary, Rastus Borton is a very pleasant 66 y.o.-year old male with an underlying medical history of type 2 diabetes, allergic rhinitis, hypertension, hyperlipidemia, reflux disease, fatty liver, coronary artery disease, arthritis and obesity whose history and physical exam are in keeping with obstructive sleep apnea (OSA). He has been able to lose weight, would need re-evaluation.  I had a long chat with the patient about my findings and the diagnosis of OSA, its prognosis and treatment options. We talked about medical treatments, surgical interventions and non-pharmacological approaches. I explained in particular the risks and ramifications of untreated moderate to severe OSA, especially with respect to developing cardiovascular disease down the Road, including congestive heart failure, difficult to treat hypertension, cardiac arrhythmias, or stroke. Even type 2 diabetes has, in part, been linked to untreated OSA. Symptoms of untreated OSA include daytime sleepiness, memory problems, mood irritability and mood disorder such as depression and anxiety, lack of energy, as well as  recurrent headaches, especially morning headaches. We talked about trying to maintain a healthy lifestyle in general, as well as the importance of weight control. I encouraged the patient to eat healthy, exercise daily and keep well hydrated, to keep a scheduled bedtime and wake time routine, to not skip any meals and eat healthy snacks in between meals. I advised the patient not to drive when feeling sleepy. I recommended the following at this time: sleep study with potential positive airway pressure titration. (We will score hypopneas at 4%).   I explained the sleep test procedure to the patient and also outlined possible surgical and non-surgical treatment options of OSA, including the use of a custom-made dental device (which would require a referral to a specialist dentist or oral surgeon), upper airway surgical options, such as pillar implants, radiofrequency surgery, tongue base surgery, and UPPP (which would involve a referral to an ENT surgeon). Rarely, jaw surgery such as mandibular advancement may be considered.  I also explained the CPAP treatment option to the patient, who indicated that he would be willing to try CPAP if the need arises. I explained the importance of being compliant with PAP treatment, not only for insurance purposes but primarily to improve His symptoms, and for the patient's long term health benefit, including to reduce His cardiovascular risks. I answered all his questions today and the patient was in agreement. I would like to see him back after the sleep study is completed and encouraged him to call with any interim questions, concerns, problems or updates.   Thank you very much for allowing me to participate in the care of  this nice patient. If I can be of any further assistance to you please do not hesitate to call me at [phone].  Sincerely,   Casey Age, MD, PhD

## 2016-12-12 LAB — HM DIABETES EYE EXAM

## 2016-12-26 ENCOUNTER — Telehealth: Payer: Self-pay

## 2016-12-26 DIAGNOSIS — G4733 Obstructive sleep apnea (adult) (pediatric): Secondary | ICD-10-CM

## 2016-12-26 NOTE — Telephone Encounter (Signed)
HST order placed. 

## 2016-12-26 NOTE — Telephone Encounter (Signed)
[employer] denied in-lab study, need HST order

## 2016-12-31 ENCOUNTER — Ambulatory Visit (INDEPENDENT_AMBULATORY_CARE_PROVIDER_SITE_OTHER): Payer: Managed Care, Other (non HMO) | Admitting: Podiatry

## 2016-12-31 ENCOUNTER — Encounter: Payer: Self-pay | Admitting: Podiatry

## 2016-12-31 DIAGNOSIS — M79676 Pain in unspecified toe(s): Secondary | ICD-10-CM

## 2016-12-31 DIAGNOSIS — B351 Tinea unguium: Secondary | ICD-10-CM

## 2016-12-31 DIAGNOSIS — M2011 Hallux valgus (acquired), right foot: Secondary | ICD-10-CM

## 2016-12-31 DIAGNOSIS — M2012 Hallux valgus (acquired), left foot: Secondary | ICD-10-CM

## 2016-12-31 NOTE — Progress Notes (Signed)
Complaint:  Visit Type: Patient returns to my office for continued preventative foot care services. Complaint: Patient states" my nails have grown long and thick and become painful to walk and wear shoes" Patient has been diagnosed with DM with neuropathy. The patient presents for preventative foot care services. No changes to ROS  Podiatric Exam: Vascular: dorsalis pedis and posterior tibial pulses are palpable bilateral. Capillary return is immediate. Temperature gradient is WNL. Skin turgor WNL  Sensorium: Absent  Semmes Weinstein monofilament test. Diminished  tactile sensation bilaterally. Nail Exam: Pt has thick disfigured discolored nails with subungual debris noted bilateral entire nail hallux through fifth toenails Ulcer Exam: There is no evidence of ulcer or pre-ulcerative changes or infection. Orthopedic Exam: Muscle tone and strength are WNL. No limitations in general ROM. No crepitus or effusions noted. HAV with hallux malleus. Tailors Bunion  B/L Skin: No Porokeratosis. No infection or ulcers  Diagnosis:  Onychomycosis, , Pain in right toe, pain in left toes,  DPN  HAV with hallux malleus.  Tailors bunion  B/L  Treatment & Plan Procedures and Treatment: Consent by patient was obtained for treatment procedures. The patient understood the discussion of treatment and procedures well. All questions were answered thoroughly reviewed. Debridement of mycotic and hypertrophic toenails, 1 through 5 bilateral and clearing of subungual debris. No ulceration, no infection noted. Initiate diabetic footwear paperwork for DPN, HAV  With tailors bunion.  Return Visit-Office Procedure: Patient instructed to return to the office for a follow up visit 3 months for continued evaluation and treatment.    Gardiner Barefoot DPM

## 2017-01-09 ENCOUNTER — Telehealth: Payer: Self-pay | Admitting: Podiatry

## 2017-01-09 MED ORDER — GABAPENTIN 100 MG PO CAPS: ORAL_CAPSULE | ORAL | 3 refills | Status: DC

## 2017-01-09 MED ORDER — GABAPENTIN 300 MG PO CAPS: 300.0000 mg | ORAL_CAPSULE | Freq: Two times a day (BID) | ORAL | 3 refills | Status: DC

## 2017-01-09 NOTE — Telephone Encounter (Signed)
Per Dr. Milinda Pointer, ok for refill for another year.  Scripts for Gabapentin 100mg  and 300mg  have been sent to pharmacy

## 2017-01-09 NOTE — Addendum Note (Signed)
Addended by: Graceann Congress D on: 01/09/2017 02:44 PM   Modules accepted: Orders

## 2017-01-09 NOTE — Telephone Encounter (Signed)
I'm calling from [facility]. The prescriber is [facility] and we wanted to confirm the daily dosage of gabapentin because we have prescriptions for 300 mg and 100 mg. We just wanted to know what is his max daily dose for gabapentin. Our phone number is [phone]. Thank you.

## 2017-01-09 NOTE — Telephone Encounter (Signed)
pATIENT NEEDS REFILL OF GABAPENTIN. [facility] 

## 2017-01-14 ENCOUNTER — Ambulatory Visit (INDEPENDENT_AMBULATORY_CARE_PROVIDER_SITE_OTHER): Payer: Managed Care, Other (non HMO) | Admitting: Neurology

## 2017-01-14 DIAGNOSIS — G4733 Obstructive sleep apnea (adult) (pediatric): Secondary | ICD-10-CM

## 2017-01-29 DIAGNOSIS — H269 Unspecified cataract: Secondary | ICD-10-CM

## 2017-01-29 HISTORY — DX: Unspecified cataract: H26.9

## 2017-02-01 ENCOUNTER — Other Ambulatory Visit: Payer: Self-pay | Admitting: Neurology

## 2017-02-01 DIAGNOSIS — G4733 Obstructive sleep apnea (adult) (pediatric): Secondary | ICD-10-CM

## 2017-02-01 NOTE — Procedures (Signed)
[facility] @[facility] [address]. [address] [place], [place] [address] NAME:  Casey Golden                                                        DOB: Jun 15, 1950 MEDICAL RECORD NUMBER   [id number]                                                   DOS: 01/14/17 REFERRING PHYSICIAN: Dr. Ria Bush STUDY PERFORMED: HST HISTORY: [name] is a 67 year old man with a history of type 2 diabetes, allergic rhinitis, hypertension, hyperlipidemia, reflux disease, fatty liver, coronary artery disease, arthritis and obesity who was previously diagnosed with obstructive sleep apnea. He has not been on CPAP therapy. Epworth sleepiness score is 10 out of 24, BMI of 35.4.  STUDY RESULTS: Total Recording Time: 7 hours, 45 minutes Total Apnea/Hypopnea Index (AHI):  9.5/hour Average Oxygen Saturation:   93% Lowest Oxygen Desaturation:   69%  Total Time Oxygen Saturation Below or at 88%: 24 mins (5%)  Average Heart Rate: 56 bpm  IMPRESSION: OSA RECOMMENDATION: This home sleep test demonstrates evidence of mild obstructive sleep apnea with a total AHI of 9.5/hour and O2 nadir of 69% as well as significant time below 89% saturation of 24 minutes. Given the patient's medical history and sleep related complaints, treatment with positive airway pressure (in the form of CPAP) is recommended. This will require a full night CPAP titration study for proper treatment settings, proper O2 monitoring and mask fitting. Please note that untreated obstructive sleep apnea carries additional perioperative morbidity. Patients with significant obstructive sleep apnea should receive perioperative PAP therapy and the surgeons and particularly the anesthesiologist should be informed of the diagnosis and the severity of the sleep disordered breathing. The patient should be cautioned not to drive, work at heights, or operate dangerous or heavy equipment when tired or sleepy. Review and reiteration of good sleep hygiene measures  should be pursued with any patient. Please note, that a home sleep test does not rule out any other underlying sleep disorder. The patient and his referring provider will be notified of the test results. The patient will be seen in follow up in sleep clinic at [facility]. I certify that I have reviewed the raw data recording prior to the issuance of this report in accordance with the standards of the American Academy of Sleep Medicine (AASM). Star Age, MD, PhD [facility]) Diplomat, ABPN (neurology and sleep)

## 2017-02-01 NOTE — Progress Notes (Signed)
Patient referred by Dr. Danise Mina, seen by me on 12/04/16, HST on 01/14/17:  Please call and notify the patient that the recent home sleep test did suggest the diagnosis of mild obstructive sleep apnea, but with more moderate desaturations. I recommend treatment for this in the form of CPAP. I will request an overnight sleep study for proper titration and mask fitting. Please explain to patient and arrange for a CPAP titration study. I have placed an order in the chart. Thanks,    Star Age, MD, PhD [facility])

## 2017-02-04 ENCOUNTER — Telehealth: Payer: Self-pay

## 2017-02-04 NOTE — Telephone Encounter (Signed)
I called pt to discuss his sleep study results. No answer, left a message asking him to call me back. 

## 2017-02-04 NOTE — Telephone Encounter (Signed)
Pt returned my call. I advised pt that [name] reviewed their sleep study results and found that pt has mild osa with moderate desaturations and recommends that pt be treated with a cpap. [name] recommends that pt return for a repeat sleep study in order to properly titrate the cpap and ensure a good mask fit. Pt is agreeable to returning for a titration study. I advised pt that our sleep lab will file with pt's insurance and call pt to schedule the sleep study when we hear back from the pt's insurance regarding coverage of this sleep study. Pt verbalized understanding of results. Pt had no questions at this time but was encouraged to call back if questions arise.

## 2017-02-04 NOTE — Telephone Encounter (Signed)
-----   Message from Star Age, MD sent at 02/01/2017 10:05 AM EST ----- Patient referred by Dr. Danise Mina, seen by me on 12/04/16, HST on 01/14/17:  Please call and notify the patient that the recent home sleep test did suggest the diagnosis of mild obstructive sleep apnea, but with more moderate desaturations. I recommend treatment for this in the form of CPAP. I will request an overnight sleep study for proper titration and mask fitting. Please explain to patient and arrange for a CPAP titration study. I have placed an order in the chart. Thanks,    Star Age, MD, PhD [facility])

## 2017-02-05 ENCOUNTER — Telehealth: Payer: Self-pay | Admitting: Podiatry

## 2017-02-05 ENCOUNTER — Other Ambulatory Visit: Payer: Self-pay | Admitting: Podiatry

## 2017-02-05 ENCOUNTER — Telehealth: Payer: Self-pay

## 2017-02-05 DIAGNOSIS — G4733 Obstructive sleep apnea (adult) (pediatric): Secondary | ICD-10-CM

## 2017-02-05 MED ORDER — GABAPENTIN 300 MG PO CAPS: 300.0000 mg | ORAL_CAPSULE | Freq: Two times a day (BID) | ORAL | 3 refills | Status: DC

## 2017-02-05 NOTE — Telephone Encounter (Signed)
Patient stopped by the office to say that the last time his Gabepentin was refilled it was only filled for 100 mlg, but he takes the 300 mlg tablet in am, and then he takes 300 mlg tablet and 137mlg at bedtime. We only refilled the 100 mlg tablet. He needs the 300 mlg tablet refilled as well and note he takes this 2 x day.

## 2017-02-05 NOTE — Telephone Encounter (Signed)
Casey Golden will deny in-lab titration, can we set him up on Auto?

## 2017-02-05 NOTE — Addendum Note (Signed)
Addended by: Clovis Riley E on: 02/05/2017 04:09 PM   Modules accepted: Orders

## 2017-02-05 NOTE — Telephone Encounter (Signed)
Could you please figure out what the heck he is talking about?

## 2017-02-05 NOTE — Telephone Encounter (Signed)
Both gabapentin 100mg  and 300mg  were refilled on 01/09/17, however it appears the 300mg  printed in the office and did not electronically go to the pharmacy. I will send in gabapentin 300mg  to pharmacy today.

## 2017-02-05 NOTE — Addendum Note (Signed)
Addended by: Rip Harbour on: 02/05/2017 04:47 PM   Modules accepted: Orders

## 2017-02-05 NOTE — Telephone Encounter (Signed)
We will set patient up with autoPAP at home, as insurance denied in house titration study for OSA. Pls process order and notify patient and set up FU in 10 weeks with me or NP.     

## 2017-02-06 ENCOUNTER — Telehealth: Payer: Self-pay | Admitting: Neurology

## 2017-02-06 NOTE — Telephone Encounter (Signed)
I called pt to discuss. No answer, left a message asking him to call me back. 

## 2017-02-06 NOTE — Telephone Encounter (Signed)
Pt has returned the call to [place], he is asking for a call  Back when she is available

## 2017-02-06 NOTE — Telephone Encounter (Signed)
I called pt. I advised pt that his insurance will deny his in lab titration study. [name] recommends that pt start an auto pap at home. I reviewed PAP compliance expectations with the pt. Pt is agreeable to starting an auto-PAP. I advised pt that an order will be sent to a DME, [facility], and [facility] will call the pt within about one week after they file with the pt's insurance. [facility] will show the pt how to use the machine, fit for masks, and troubleshoot the auto-PAP if needed. A follow up appt was made for insurance purposes with [name] on 3/.19/19 at 9:30am. Pt verbalized understanding to arrive 15 minutes early and bring their auto-PAP. A letter with all of this information in it will be mailed to the pt as a reminder. I verified with the pt that the address we have on file is correct. Pt verbalized understanding of results.  Pt is asking why his insurance will not cover his sleep study when he has [place] and Medicare and is asking for a call from our sleep lab to discuss. I will go ahead and send auto pap order to [facility] in the meantime. Pt is aware this.

## 2017-02-06 NOTE — Telephone Encounter (Signed)
Pt returned your call about auto pap. If you could give a call back at your earliest convenience.

## 2017-02-07 ENCOUNTER — Other Ambulatory Visit: Payer: Self-pay | Admitting: Cardiovascular Disease

## 2017-02-08 ENCOUNTER — Other Ambulatory Visit: Payer: Self-pay | Admitting: Family Medicine

## 2017-02-09 ENCOUNTER — Other Ambulatory Visit: Payer: Self-pay | Admitting: Family Medicine

## 2017-02-13 ENCOUNTER — Other Ambulatory Visit: Payer: Self-pay | Admitting: Family Medicine

## 2017-03-02 NOTE — Progress Notes (Signed)
Cardiology Office Note  Date:  03/05/2017   ID:  Ruven Corradi, DOB 1950/12/10, MRN [id number]  PCP:  Ria Bush, MD   Chief Complaint  Patient presents with  . Other    12 month follow up. Patient chest pain and SOB. Meds reviewed verbally with patient.     HPI:  Mr Marques is a very pleasant 67 year old gentleman returns today for evaluation and management of his coronary artery disease,  He has 50% mid RCA disease by cardiac catheterization in 2009 with 30% also LAD disease  He has a history of smoking for 27 years,  mixed hyperlipidemia, hypertension. he does report a syncopal episode many years ago while walking his dog. He was down for one minute. He had no warning. He has not had any further episodes since that time He reports having cardiac catheterization 3 in the past Most recently at [facility]. Report has been requested  Mixes ocra and water, 3 times a day Feels well, no chest pain, no shortness of breath Working long hours at [employer] Chronic back pain would like to see a chiropractor Previously did physical therapy was told he might need back surgery  Previous cortisone shot to the knee, sugars went up Hemoglobin A1c 7.6 now down to 6.6  HBA1C 6.6 Total 106, LDL 47 LFTS stable, mildly elevated  EKG on today's visit shows no sinus rhythm with rate 63 bpm, no significant ST or T-wave changes  Cardiac catheterization report 05/13/2006 detailing normal ejection fraction, 50% mid RCA disease  cardiac catheterization November 2009 showing 40% mid RCA disease, normal ejection fraction  Other past medical history reviewed   testicular surgery 09/14/2014 in [place] with Dr. Junious Silk.  Previously had some sweating episodes at times Was previously on Symbicort but this was expensive for him, now uses albuterol as needed  He had a positive Myoview several years ago which resulted in a heart catheterization. He had nonobstructive disease. His normal left  ventricular function.   PMH:   has a past medical history of Arthritis, CAD (coronary artery disease), Colonic polyp, Fatty liver disease, nonalcoholic (2836), GERD (gastroesophageal reflux disease), History of chicken pox, Hyperlipidemia, Hypertension, Nocturia, OSA (obstructive sleep apnea), Rash of genital area, Seasonal and perennial allergic rhinitis, Type 2 diabetes mellitus ([place]), Wears dentures, and Wears glasses.  PSH:    Past Surgical History:  Procedure Laterality Date  . ABDOMINAL HERNIA REPAIR  2007      [facility]   open repair  . APPENDECTOMY  age 89  . CARDIAC CATHETERIZATION  12-23-2007   [facility]   Abnormal myoview w/ ischemia/  40% mRCA with nonobstructive and no sig. plaque in his left system, EF 55%  . CARDIAC CATHETERIZATION  Apr 2008    [facility]   Abnormal myoview/  50% RCA,  ef 65%  . CARDIAC CATHETERIZATION  1999      [facility]  . CERVICAL FUSION  1992  . CHOLECYSTECTOMY OPEN  2006  . COLONOSCOPY WITH PROPOFOL N/A 04/17/2016   TAs, high grade dysplasia with margins clear, diverticulosis Lucilla Lame, MD)  . COLONOSCOPY WITH PROPOFOL N/A 08/07/2016   TAx1, diverticulosis, rpt 3 yrs ([facility])  . DENTAL SURGERY     metal dental implant L mandible  . EXCISION OF SKIN TAG Right 09/14/2014   Procedure: EXCISION OF SKIN TAG;  Surgeon: Festus Aloe, MD;  Location: [facility];  Service: Urology;  Laterality: Right;  . HYDROCELE EXCISION Left 09/14/2014   Procedure: LEFT HYDROCELECTOMY ADULT;  Surgeon:  Festus Aloe, MD;  Location: [facility];  Service: Urology;  Laterality: Left;  . MOHS SURGERY  2015   skin cancer  . TONSILLECTOMY  age 69    Current Outpatient Medications  Medication Sig Dispense Refill  . albuterol (PROVENTIL HFA;VENTOLIN HFA) 108 (90 Base) MCG/ACT inhaler Inhale 2 puffs into the lungs every 6 (six) hours as needed for wheezing. 1 Inhaler 2  . aspirin 81 MG EC tablet Take 1 tablet (81 mg total) by mouth daily. 30 tablet 12  .  atenolol (TENORMIN) 25 MG tablet TAKE 1 TABLET BY MOUTH EVERY DAY 90 tablet 3  . atorvastatin (LIPITOR) 20 MG tablet TAKE 1 TABLET (20 MG TOTAL) BY MOUTH DAILY. 90 tablet 3  . diclofenac sodium (VOLTAREN) 1 % GEL APPLY 2 G TOPICALLY 3 (THREE) TIMES DAILY AS NEEDED (ANTI INFLAMMATORY). 100 g 1  . gabapentin (NEURONTIN) 300 MG capsule Take 1 capsule (300 mg total) by mouth 2 (two) times daily. Take 1 caps in AM and 1 caps at bedtime along with 100mg  cap (total 400mg ) 180 capsule 3  . ibuprofen (ADVIL,MOTRIN) 200 MG tablet Take 400-600 mg by mouth every 6 (six) hours as needed for mild pain (DEPENDS ON PAIN IF TAKES 400-600 MG).     Marland Kitchen losartan (COZAAR) 100 MG tablet TAKE 1 TABLET EVERY DAY 90 tablet 3  . metFORMIN (GLUCOPHAGE) 1000 MG tablet TAKE 1 TABLET TWICE DAILY WITH A MEAL 180 tablet 0  . ONE TOUCH LANCETS MISC Use to check sugar once daily and as needed. Dx: E11.40, E11.65 **One Touch Ultra per insurance 100 each 1  . ONE TOUCH ULTRA TEST test strip USE TO CHECK SUGAR ONCE DAILY AND AS NEEDED 50 each 1   No current facility-administered medications for this visit.      Allergies:   Sulfa antibiotics   Social History:  The patient  reports that he quit smoking about 27 years ago. His smoking use included cigarettes. He has a 70.00 pack-year smoking history. he has never used smokeless tobacco. He reports that he does not drink alcohol or use drugs.   Family History:   family history includes CAD in his mother; Cancer in his sister; Cancer (age of onset: 86) in his father; Diabetes in his mother; Heart disease in his paternal grandfather and paternal grandmother; Stroke in his mother.    Review of Systems: Review of Systems  Constitutional: Negative.   Respiratory: Negative.   Cardiovascular: Negative.   Gastrointestinal: Negative.   Musculoskeletal: Positive for back pain and joint pain.  Neurological: Negative.   Psychiatric/Behavioral: Negative.   All other systems reviewed and are  negative.    PHYSICAL EXAM: VS:  BP (!) 142/80 (BP Location: Left Arm, Patient Position: Sitting, Cuff Size: Normal)   Pulse 63   Ht 5\' 9"  (1.753 m)   Wt 243 lb 12 oz (110.6 kg)   BMI 36.00 kg/m  , BMI Body mass index is 36 kg/m. Constitutional:  oriented to person, place, and time. No distress.  HENT:  Head: Normocephalic and atraumatic.  Eyes:  no discharge. No scleral icterus.  Neck: Normal range of motion. Neck supple. No JVD present.  Cardiovascular: Normal rate, regular rhythm, normal heart sounds and intact distal pulses. Exam reveals no gallop and no friction rub. No edema No murmur heard. Pulmonary/Chest: Effort normal and breath sounds normal. No stridor. No respiratory distress.  no wheezes.  no rales.  no tenderness.  Abdominal: Soft.  no distension.  no  tenderness.  Musculoskeletal: Normal range of motion.  no  tenderness or deformity.  Neurological:  normal muscle tone. Coordination normal. No atrophy Skin: Skin is warm and dry. No rash noted. not diaphoretic.  Psychiatric:  normal mood and affect. behavior is normal. Thought content normal.       Recent Labs: 07/10/2016: ALT 49 10/25/2016: BUN 15; Creatinine, Ser 0.89; Potassium 4.3; Sodium 137    Lipid Panel Lab Results  Component Value Date   CHOL 106 07/10/2016   HDL 28.10 (L) 07/10/2016   LDLCALC 47 07/10/2016   TRIG 154.0 (H) 07/10/2016      Wt Readings from Last 3 Encounters:  03/05/17 243 lb 12 oz (110.6 kg)  12/04/16 240 lb 8 oz (109.1 kg)  11/22/16 240 lb 8 oz (109.1 kg)       ASSESSMENT AND PLAN:  Coronary artery disease involving native coronary artery of native heart without angina pectoris - Plan: EKG 12-Lead Currently with no symptoms of angina. No further workup at this time. Continue current medication regimen.  Stable  Essential hypertension Blood pressure is well controlled on today's visit. No changes made to the medications.  Uncontrolled type 2 diabetes with neuropathy  (HCC) Hemoglobin A1c improved 6.6 Continue dietary restriction  Right knee pain, unspecified chronicity Stable, previous cortisone shot  Chronic midline low back pain, with sciatica presence unspecified Chronic pain, may need back surgery He is preparing to see chiropractor  Disposition:   F/U  12 months   Total encounter time more than 25 minutes  Greater than 50% was spent in counseling and coordination of care with the patient    Orders Placed This Encounter  Procedures  . EKG 12-Lead     Signed, Esmond Plants, M.D., Ph.D. 03/05/2017  [place], [place]

## 2017-03-05 ENCOUNTER — Encounter: Payer: Self-pay | Admitting: Cardiovascular Disease

## 2017-03-05 ENCOUNTER — Ambulatory Visit (INDEPENDENT_AMBULATORY_CARE_PROVIDER_SITE_OTHER): Payer: Managed Care, Other (non HMO) | Admitting: Cardiovascular Disease

## 2017-03-05 VITALS — BP 142/80 | HR 63 | Ht 69.0 in | Wt 243.8 lb

## 2017-03-05 DIAGNOSIS — I10 Essential (primary) hypertension: Secondary | ICD-10-CM | POA: Diagnosis not present

## 2017-03-05 DIAGNOSIS — E782 Mixed hyperlipidemia: Secondary | ICD-10-CM

## 2017-03-05 DIAGNOSIS — E1165 Type 2 diabetes mellitus with hyperglycemia: Secondary | ICD-10-CM

## 2017-03-05 DIAGNOSIS — E1140 Type 2 diabetes mellitus with diabetic neuropathy, unspecified: Secondary | ICD-10-CM

## 2017-03-05 DIAGNOSIS — I209 Angina pectoris, unspecified: Secondary | ICD-10-CM

## 2017-03-05 DIAGNOSIS — IMO0002 Reserved for concepts with insufficient information to code with codable children: Secondary | ICD-10-CM

## 2017-03-05 DIAGNOSIS — I25118 Atherosclerotic heart disease of native coronary artery with other forms of angina pectoris: Secondary | ICD-10-CM

## 2017-03-05 NOTE — Patient Instructions (Signed)

## 2017-03-15 ENCOUNTER — Telehealth: Payer: Self-pay | Admitting: Neurology

## 2017-03-15 NOTE — Telephone Encounter (Signed)
Pt is requesting a call back to discuss his CPAP. He stating he hasn't heard anything else back

## 2017-03-18 NOTE — Telephone Encounter (Signed)
Received this notice from [facility]: "On February 05th Lovena Le gave him his intake ID to Call [place] and get financials and advised he would call back to schedule.  I will ask Lovena Le to reach out to him today"

## 2017-03-18 NOTE — Telephone Encounter (Signed)
I called pt, asked him to call [facility], gave him [facility]'s phone number. Pt will call them.

## 2017-03-18 NOTE — Telephone Encounter (Signed)
I have reached out to [facility] to find out what is going on. 

## 2017-03-19 NOTE — Telephone Encounter (Signed)
I called pt. He spoke with [facility]. I advised him that we should push his appt out farther since pt has not started cpap. Pt is agreeable to moving his appt from March 19th, 2019 to 05/28/17. Pt asked me to send him a letter with his new appts. Pt verbalized understanding of new appt date and time.

## 2017-03-20 ENCOUNTER — Other Ambulatory Visit: Payer: Self-pay | Admitting: Family Medicine

## 2017-03-20 DIAGNOSIS — E1165 Type 2 diabetes mellitus with hyperglycemia: Principal | ICD-10-CM

## 2017-03-20 DIAGNOSIS — E785 Hyperlipidemia, unspecified: Secondary | ICD-10-CM

## 2017-03-20 DIAGNOSIS — Z125 Encounter for screening for malignant neoplasm of prostate: Secondary | ICD-10-CM

## 2017-03-20 DIAGNOSIS — IMO0002 Reserved for concepts with insufficient information to code with codable children: Secondary | ICD-10-CM

## 2017-03-20 DIAGNOSIS — K760 Fatty (change of) liver, not elsewhere classified: Secondary | ICD-10-CM

## 2017-03-20 DIAGNOSIS — E1169 Type 2 diabetes mellitus with other specified complication: Secondary | ICD-10-CM

## 2017-03-20 DIAGNOSIS — E1140 Type 2 diabetes mellitus with diabetic neuropathy, unspecified: Secondary | ICD-10-CM

## 2017-03-21 ENCOUNTER — Other Ambulatory Visit (INDEPENDENT_AMBULATORY_CARE_PROVIDER_SITE_OTHER): Payer: Managed Care, Other (non HMO)

## 2017-03-21 DIAGNOSIS — Z125 Encounter for screening for malignant neoplasm of prostate: Secondary | ICD-10-CM

## 2017-03-21 DIAGNOSIS — E1169 Type 2 diabetes mellitus with other specified complication: Secondary | ICD-10-CM

## 2017-03-21 DIAGNOSIS — E1165 Type 2 diabetes mellitus with hyperglycemia: Secondary | ICD-10-CM | POA: Diagnosis not present

## 2017-03-21 DIAGNOSIS — E1140 Type 2 diabetes mellitus with diabetic neuropathy, unspecified: Secondary | ICD-10-CM | POA: Diagnosis not present

## 2017-03-21 DIAGNOSIS — E785 Hyperlipidemia, unspecified: Secondary | ICD-10-CM | POA: Diagnosis not present

## 2017-03-21 DIAGNOSIS — K760 Fatty (change of) liver, not elsewhere classified: Secondary | ICD-10-CM

## 2017-03-21 DIAGNOSIS — IMO0002 Reserved for concepts with insufficient information to code with codable children: Secondary | ICD-10-CM

## 2017-03-21 LAB — LIPID PANEL
Cholesterol: 105 mg/dL (ref 0–200)
HDL: 28.4 mg/dL — ABNORMAL LOW (ref >39.00)
LDL Cholesterol: 43 mg/dL (ref 0–99)
NonHDL: 76.39
Total CHOL/HDL Ratio: 4
Triglycerides: 166 mg/dL — ABNORMAL HIGH (ref 0.0–149.0)
VLDL: 33.2 mg/dL (ref 0.0–40.0)

## 2017-03-21 LAB — COMPREHENSIVE METABOLIC PANEL
ALT: 27 U/L (ref 0–53)
AST: 26 U/L (ref 0–37)
Albumin: 4.1 g/dL (ref 3.5–5.2)
Alkaline Phosphatase: 64 U/L (ref 39–117)
BUN: 13 mg/dL (ref 6–23)
CO2: 35 mEq/L — ABNORMAL HIGH (ref 19–32)
Calcium: 9.8 mg/dL (ref 8.4–10.5)
Chloride: 100 mEq/L (ref 96–112)
Creatinine, Ser: 0.89 mg/dL (ref 0.40–1.50)
GFR: 90.11 mL/min (ref >60.00)
Glucose, Bld: 124 mg/dL — ABNORMAL HIGH (ref 70–99)
Potassium: 5.1 mEq/L (ref 3.5–5.1)
Sodium: 138 mEq/L (ref 135–145)
Total Bilirubin: 0.9 mg/dL (ref 0.2–1.2)
Total Protein: 6.9 g/dL (ref 6.0–8.3)

## 2017-03-21 LAB — HEMOGLOBIN A1C: Hgb A1c MFr Bld: 6.5 % (ref 4.6–6.5)

## 2017-03-21 LAB — PSA, MEDICARE: PSA: 6.81 ng/mL — ABNORMAL HIGH (ref 0.10–4.00)

## 2017-03-27 ENCOUNTER — Ambulatory Visit (INDEPENDENT_AMBULATORY_CARE_PROVIDER_SITE_OTHER): Payer: Managed Care, Other (non HMO) | Admitting: Family Medicine

## 2017-03-27 ENCOUNTER — Encounter: Payer: Self-pay | Admitting: Family Medicine

## 2017-03-27 ENCOUNTER — Ambulatory Visit (INDEPENDENT_AMBULATORY_CARE_PROVIDER_SITE_OTHER)
Admission: RE | Admit: 2017-03-27 | Discharge: 2017-03-27 | Disposition: A | Discharge status: Home / Self Care | Payer: Managed Care, Other (non HMO) | Source: Ambulatory Visit | Attending: Family Medicine | Admitting: Family Medicine

## 2017-03-27 VITALS — BP 136/68 | HR 64 | Temp 97.9°F | Ht 68.0 in | Wt 236.0 lb

## 2017-03-27 DIAGNOSIS — G4733 Obstructive sleep apnea (adult) (pediatric): Secondary | ICD-10-CM | POA: Diagnosis not present

## 2017-03-27 DIAGNOSIS — K760 Fatty (change of) liver, not elsewhere classified: Secondary | ICD-10-CM

## 2017-03-27 DIAGNOSIS — E1169 Type 2 diabetes mellitus with other specified complication: Secondary | ICD-10-CM

## 2017-03-27 DIAGNOSIS — R0602 Shortness of breath: Secondary | ICD-10-CM | POA: Diagnosis not present

## 2017-03-27 DIAGNOSIS — Z87891 Personal history of nicotine dependence: Secondary | ICD-10-CM | POA: Diagnosis not present

## 2017-03-27 DIAGNOSIS — I2510 Atherosclerotic heart disease of native coronary artery without angina pectoris: Secondary | ICD-10-CM | POA: Diagnosis not present

## 2017-03-27 DIAGNOSIS — Z0000 Encounter for general adult medical examination without abnormal findings: Secondary | ICD-10-CM

## 2017-03-27 DIAGNOSIS — E118 Type 2 diabetes mellitus with unspecified complications: Secondary | ICD-10-CM

## 2017-03-27 DIAGNOSIS — I10 Essential (primary) hypertension: Secondary | ICD-10-CM

## 2017-03-27 DIAGNOSIS — Z7189 Other specified counseling: Secondary | ICD-10-CM

## 2017-03-27 DIAGNOSIS — R972 Elevated prostate specific antigen [PSA]: Secondary | ICD-10-CM

## 2017-03-27 DIAGNOSIS — E785 Hyperlipidemia, unspecified: Secondary | ICD-10-CM

## 2017-03-27 NOTE — Assessment & Plan Note (Signed)
Advanced directive discussion - scanned and in chart 12/2016. Does not want prolonged life support if terminal condition. No HCPOA form filled out. He will think about this - maybe daughter Lenna Sciara.

## 2017-03-27 NOTE — Assessment & Plan Note (Signed)
Preventative protocols reviewed and updated unless pt declined. Discussed healthy diet and lifestyle.  

## 2017-03-27 NOTE — Assessment & Plan Note (Signed)
LFTs stable. Off tricor.

## 2017-03-27 NOTE — Assessment & Plan Note (Signed)
Appreciate cards care. Continue aspirin, statin.  

## 2017-03-27 NOTE — Patient Instructions (Addendum)
If interested, check with pharmacy about new 2 shot shingles series (shingrix).  Recheck prostate level today. We will be in touch with results.  Chest xray today.  Good to see you today, call us with questions.  Return as needed or in 6 months for follow up visit.   Health Maintenance, Male A healthy lifestyle and preventive care is important for your health and wellness. Ask your health care provider about what schedule of regular examinations is right for you. What should I know about weight and diet? Eat a Healthy Diet  Eat plenty of vegetables, fruits, whole grains, low-fat dairy products, and lean protein.  Do not eat a lot of foods high in solid fats, added sugars, or salt.  Maintain a Healthy Weight Regular exercise can help you achieve or maintain a healthy weight. You should:  Do at least 150 minutes of exercise each week. The exercise should increase your heart rate and make you sweat (moderate-intensity exercise).  Do strength-training exercises at least twice a week.  Watch Your Levels of Cholesterol and Blood Lipids  Have your blood tested for lipids and cholesterol every 5 years starting at 69 years of age. If you are at high risk for heart disease, you should start having your blood tested when you are 69 years old. You may need to have your cholesterol levels checked more often if: ? Your lipid or cholesterol levels are high. ? You are older than 69 years of age. ? You are at high risk for heart disease.  What should I know about cancer screening? Many types of cancers can be detected early and may often be prevented. Lung Cancer  You should be screened every year for lung cancer if: ? You are a current smoker who has smoked for at least 30 years. ? You are a former smoker who has quit within the past 15 years.  Talk to your health care provider about your screening options, when you should start screening, and how often you should be screened.  Colorectal  Cancer  Routine colorectal cancer screening usually begins at 69 years of age and should be repeated every 5-10 years until you are 69 years old. You may need to be screened more often if early forms of precancerous polyps or small growths are found. Your health care provider may recommend screening at an earlier age if you have risk factors for colon cancer.  Your health care provider may recommend using home test kits to check for hidden blood in the stool.  A small camera at the end of a tube can be used to examine your colon (sigmoidoscopy or colonoscopy). This checks for the earliest forms of colorectal cancer.  Prostate and Testicular Cancer  Depending on your age and overall health, your health care provider may do certain tests to screen for prostate and testicular cancer.  Talk to your health care provider about any symptoms or concerns you have about testicular or prostate cancer.  Skin Cancer  Check your skin from head to toe regularly.  Tell your health care provider about any new moles or changes in moles, especially if: ? There is a change in a mole's size, shape, or color. ? You have a mole that is larger than a pencil eraser.  Always use sunscreen. Apply sunscreen liberally and repeat throughout the day.  Protect yourself by wearing long sleeves, pants, a wide-brimmed hat, and sunglasses when outside.  What should I know about heart disease, diabetes, and high blood  pressure?  If you are 62-37 years of age, have your blood pressure checked every 3-5 years. If you are 34 years of age or older, have your blood pressure checked every year. You should have your blood pressure measured twice-once when you are at a hospital or clinic, and once when you are not at a hospital or clinic. Record the average of the two measurements. To check your blood pressure when you are not at a hospital or clinic, you can use: ? An automated blood pressure machine at a pharmacy. ? A home blood  pressure monitor.  Talk to your health care provider about your target blood pressure.  If you are between 53-62 years old, ask your health care provider if you should take aspirin to prevent heart disease.  Have regular diabetes screenings by checking your fasting blood sugar level. ? If you are at a normal weight and have a low risk for diabetes, have this test once every three years after the age of 66. ? If you are overweight and have a high risk for diabetes, consider being tested at a younger age or more often.  A one-time screening for abdominal aortic aneurysm (AAA) by ultrasound is recommended for men aged 64-75 years who are current or former smokers. What should I know about preventing infection? Hepatitis B If you have a higher risk for hepatitis B, you should be screened for this virus. Talk with your health care provider to find out if you are at risk for hepatitis B infection. Hepatitis C Blood testing is recommended for:  Everyone born from [birth year] through 1967.  Anyone with known risk factors for hepatitis C.  Sexually Transmitted Diseases (STDs)  You should be screened each year for STDs including gonorrhea and chlamydia if: ? You are sexually active and are younger than 69 years of age. ? You are older than 69 years of age and your health care provider tells you that you are at risk for this type of infection. ? Your sexual activity has changed since you were last screened and you are at an increased risk for chlamydia or gonorrhea. Ask your health care provider if you are at risk.  Talk with your health care provider about whether you are at high risk of being infected with HIV. Your health care provider may recommend a prescription medicine to help prevent HIV infection.  What else can I do?  Schedule regular health, dental, and eye exams.  Stay current with your vaccines (immunizations).  Do not use any tobacco products, such as cigarettes, chewing tobacco, and  e-cigarettes. If you need help quitting, ask your health care provider.  Limit alcohol intake to no more than 2 drinks per day. One drink equals 12 ounces of beer, 5 ounces of wine, or 1 ounces of hard liquor.  Do not use street drugs.  Do not share needles.  Ask your health care provider for help if you need support or information about quitting drugs.  Tell your health care provider if you often feel depressed.  Tell your health care provider if you have ever been abused or do not feel safe at home. This information is not intended to replace advice given to you by your health care provider. Make sure you discuss any questions you have with your health care provider. Document Released: 07/14/2007 Document Revised: 09/14/2015 Document Reviewed: 10/19/2014 Elsevier Interactive Patient Education  [employer].

## 2017-03-27 NOTE — Assessment & Plan Note (Addendum)
Chronic, stable. Continue lipitor. Now off tricor. Encouraged increased exercise to improve HDL The ASCVD Risk score Mikey Bussing DC Jr., et al., 2013) failed to calculate for the following reasons:   The valid total cholesterol range is 130 to 320 mg/dL

## 2017-03-27 NOTE — Assessment & Plan Note (Signed)
Discussed healthy lifestyle for sustainable weight loss.

## 2017-03-27 NOTE — Progress Notes (Signed)
BP 136/68 (BP Location: Left Arm, Patient Position: Sitting, Cuff Size: Normal)   Pulse 64   Temp 97.9 F (36.6 C) (Oral)   Ht 5\' 8"  (1.727 m)   Wt 236 lb (107 kg)   SpO2 98%   BMI 35.88 kg/m    CC: CPE Subjective:    Patient ID: Casey Golden, male    DOB: Apr 22, 1950, 67 y.o.   MRN: [id number]  HPI: Casey Golden is a 67 y.o. male presenting on 03/27/2017 for Annual Exam (Says he had a recent DM eye exam)   Has medicare part B as secondary OSA on CPAP - started CPAP last week ([facility]).  CAD followed by cardiology Rockey Situ) Using okra water to control diabetes.  Extensive smoking history - asks about CXR.   Preventative: COLONOSCOPY WITH PROPOFOL 04/17/2016 TAs, high grade dysplasia with margins clear, diverticulosis Lucilla Lame, MD) COLONOSCOPY WITH PROPOFOL 08/07/2016 TAx1, diverticulosis, rpt 3 yrs Allen Norris)  Prostate cancer screening - yearly Lung cancer screening - does not meet criteria Flu shot - yearly Td 2017 Pneumovax 2013, prevnar 2017 zostavax - 08/2012 shingrix - discussed Advanced directive discussion - scanned and in chart 12/2016. Does not want prolonged life support if terminal condition. No HCPOA form filled out. He will think about this - maybe daughter Casey Golden.  Seat belt use discussed.  Sunscreen use discussed, no changing moles on skin.  Ex smoker quit 1992  Alcohol - none   Lives with wife, dog and cats  Occupation: retired, was self employed, now works at [employer]  Edu: HS Activity: walks 1.5 mi daily Diet: some water, fruits/vegetables daily  Relevant past medical, surgical, family and social history reviewed and updated as indicated. Interim medical history since our last visit reviewed. Allergies and medications reviewed and updated. Outpatient Medications Prior to Visit  Medication Sig Dispense Refill  . albuterol (PROVENTIL HFA;VENTOLIN HFA) 108 (90 Base) MCG/ACT inhaler Inhale 2 puffs into the lungs every 6 (six) hours as needed for  wheezing. 1 Inhaler 2  . aspirin 81 MG EC tablet Take 1 tablet (81 mg total) by mouth daily. 30 tablet 12  . atenolol (TENORMIN) 25 MG tablet TAKE 1 TABLET BY MOUTH EVERY DAY 90 tablet 3  . atorvastatin (LIPITOR) 20 MG tablet TAKE 1 TABLET (20 MG TOTAL) BY MOUTH DAILY. 90 tablet 3  . diclofenac sodium (VOLTAREN) 1 % GEL APPLY 2 G TOPICALLY 3 (THREE) TIMES DAILY AS NEEDED (ANTI INFLAMMATORY). 100 g 1  . gabapentin (NEURONTIN) 300 MG capsule Take 1 capsule (300 mg total) by mouth 2 (two) times daily. Take 1 caps in AM and 1 caps at bedtime along with 100mg  cap (total 400mg ) 180 capsule 3  . ibuprofen (ADVIL,MOTRIN) 200 MG tablet Take 400-600 mg by mouth every 6 (six) hours as needed for mild pain (DEPENDS ON PAIN IF TAKES 400-600 MG).     Marland Kitchen losartan (COZAAR) 100 MG tablet TAKE 1 TABLET EVERY DAY 90 tablet 3  . metFORMIN (GLUCOPHAGE) 1000 MG tablet TAKE 1 TABLET TWICE DAILY WITH A MEAL 180 tablet 0  . ONE TOUCH LANCETS MISC Use to check sugar once daily and as needed. Dx: E11.40, E11.65 **One Touch Ultra per insurance 100 each 1  . ONE TOUCH ULTRA TEST test strip USE TO CHECK SUGAR ONCE DAILY AND AS NEEDED 50 each 1   No facility-administered medications prior to visit.      Per HPI unless specifically indicated in ROS section below Review of Systems  Constitutional:  Negative for activity change, appetite change, chills, fatigue, fever and unexpected weight change.  HENT: Negative for hearing loss.   Eyes: Negative for visual disturbance.  Respiratory: Negative for cough, chest tightness, shortness of breath and wheezing.   Cardiovascular: Negative for chest pain, palpitations and leg swelling.  Gastrointestinal: Positive for blood in stool (rare, occasional hemorrhoids). Negative for abdominal distention, abdominal pain, constipation, diarrhea, nausea and vomiting.       UTD colonoscopy  Genitourinary: Negative for decreased urine volume, difficulty urinating, frequency and hematuria.        Nocturia x1. No new hesitancy or urgency or dribbling.  Musculoskeletal: Negative for arthralgias, myalgias and neck pain.  Skin: Negative for rash.  Neurological: Negative for dizziness, seizures, syncope and headaches.  Hematological: Negative for adenopathy. Bruises/bleeds easily (likely aspirin related).  Psychiatric/Behavioral: Negative for dysphoric mood. The patient is not nervous/anxious.        Objective:    BP 136/68 (BP Location: Left Arm, Patient Position: Sitting, Cuff Size: Normal)   Pulse 64   Temp 97.9 F (36.6 C) (Oral)   Ht 5\' 8"  (1.727 m)   Wt 236 lb (107 kg)   SpO2 98%   BMI 35.88 kg/m   Wt Readings from Last 3 Encounters:  03/27/17 236 lb (107 kg)  03/05/17 243 lb 12 oz (110.6 kg)  12/04/16 240 lb 8 oz (109.1 kg)    Physical Exam  Constitutional: He is oriented to person, place, and time. He appears well-developed and well-nourished. No distress.  HENT:  Head: Normocephalic and atraumatic.  Right Ear: Hearing, tympanic membrane, external ear and ear canal normal.  Left Ear: Hearing, tympanic membrane, external ear and ear canal normal.  Nose: Nose normal.  Mouth/Throat: Uvula is midline, oropharynx is clear and moist and mucous membranes are normal. No oropharyngeal exudate, posterior oropharyngeal edema or posterior oropharyngeal erythema.  Eyes: Conjunctivae and EOM are normal. Pupils are equal, round, and reactive to light. No scleral icterus.  Neck: Normal range of motion. Neck supple. Carotid bruit is not present. No thyromegaly present.  Cardiovascular: Normal rate, regular rhythm, normal heart sounds and intact distal pulses.  No murmur heard. Pulses:      Radial pulses are 2+ on the right side, and 2+ on the left side.  Pulmonary/Chest: Effort normal and breath sounds normal. No respiratory distress. He has no wheezes. He has no rales.  Abdominal: Soft. Bowel sounds are normal. He exhibits no distension and no mass. There is no tenderness. There is  no rebound and no guarding.  Genitourinary: Rectum normal and prostate normal. Rectal exam shows no external hemorrhoid, no internal hemorrhoid, no fissure, no tenderness and anal tone normal. Prostate is not enlarged (20gm) and not tender.  Musculoskeletal: Normal range of motion. He exhibits edema (mild fullness without pitting edema RLE).  Lymphadenopathy:    He has no cervical adenopathy.  Neurological: He is alert and oriented to person, place, and time.  CN grossly intact, station and gait intact  Skin: Skin is warm and dry. No rash noted.  Psychiatric: He has a normal mood and affect. His behavior is normal. Judgment and thought content normal.  Nursing note and vitals reviewed.  Results for orders placed or performed in visit on 03/21/17  PSA, Medicare  Result Value Ref Range   PSA 6.81 (H) 0.10 - 4.00 ng/ml  Hemoglobin A1c  Result Value Ref Range   Hgb A1c MFr Bld 6.5 4.6 - 6.5 %  Comprehensive metabolic panel  Result  Value Ref Range   Sodium 138 135 - 145 mEq/L   Potassium 5.1 3.5 - 5.1 mEq/L   Chloride 100 96 - 112 mEq/L   CO2 35 (H) 19 - 32 mEq/L   Glucose, Bld 124 (H) 70 - 99 mg/dL   BUN 13 6 - 23 mg/dL   Creatinine, Ser 0.89 0.40 - 1.50 mg/dL   Total Bilirubin 0.9 0.2 - 1.2 mg/dL   Alkaline Phosphatase 64 39 - 117 U/L   AST 26 0 - 37 U/L   ALT 27 0 - 53 U/L   Total Protein 6.9 6.0 - 8.3 g/dL   Albumin 4.1 3.5 - 5.2 g/dL   Calcium 9.8 8.4 - 10.5 mg/dL   GFR 90.11 >60.00 mL/min  Lipid panel  Result Value Ref Range   Cholesterol 105 0 - 200 mg/dL   Triglycerides 166.0 (H) 0.0 - 149.0 mg/dL   HDL 28.40 (L) >39.00 mg/dL   VLDL 33.2 0.0 - 40.0 mg/dL   LDL Cholesterol 43 0 - 99 mg/dL   Total CHOL/HDL Ratio 4    NonHDL 76.39    Lab Results  Component Value Date   PSA 6.81 (H) 03/21/2017   PSA 2.69 03/07/2016   PSA 1.82 03/01/2015      Assessment & Plan:   Problem List Items Addressed This Visit    Advanced care planning/counseling discussion    Advanced  directive discussion - scanned and in chart 12/2016. Does not want prolonged life support if terminal condition. No HCPOA form filled out. He will think about this - maybe daughter Casey Golden.       CAD (coronary artery disease), native coronary artery    Appreciate cards care. Continue aspirin, statin.       Controlled diabetes mellitus type 2 with complications (HCC)    Chronic, stable. Continue metformin 1000mg  bid. RTC 6 mo DM f/u visit.       Dyslipidemia associated with type 2 diabetes mellitus (HCC)    Chronic, stable. Continue lipitor. Now off tricor. Encouraged increased exercise to improve HDL The ASCVD Risk score Mikey Bussing DC Jr., et al., 2013) failed to calculate for the following reasons:   The valid total cholesterol range is 130 to 320 mg/dL       Elevated PSA    DRE reassuring. Recheck PSA with free PSA. If persistently elevated, discussed urology referral. Pt agrees with plan.       Relevant Orders   PSA, Total with Reflex to PSA, Free   Ex-smoker    Not eligible for lung cancer screening CT as he quit > 15 yrs ago. Check CXR today.       Relevant Orders   DG Chest 2 View   Fatty liver disease, nonalcoholic    LFTs stable. Off tricor.       Health maintenance examination - Primary    Preventative protocols reviewed and updated unless pt declined. Discussed healthy diet and lifestyle.       HTN (hypertension)    Chronic, stable. Continue current regimen.       OSA (obstructive sleep apnea)    Recently started CPAP ([facility]). Encouraged use.       Relevant Orders   DG Chest 2 View   Severe obesity (BMI 35.0-39.9) with comorbidity ([place])    Discussed healthy lifestyle for sustainable weight loss.       Shortness of breath   Relevant Orders   DG Chest 2 View       No orders of  the defined types were placed in this encounter.  Orders Placed This Encounter  Procedures  . DG Chest 2 View    Standing Status:   Future    Number of Occurrences:   1     Standing Expiration Date:   05/26/2018    Order Specific Question:   Reason for Exam (SYMPTOM  OR DIAGNOSIS REQUIRED)    Answer:   ex-smoker, h/o COPD    Order Specific Question:   Preferred imaging location?    Answer:   [facility]    Order Specific Question:   Radiology Contrast Protocol - do NOT remove file path    Answer:   [email or web address]  . PSA, Total with Reflex to PSA, Free    Follow up plan: Return in about 6 months (around 09/24/2017) for follow up visit.  Ria Bush, MD

## 2017-03-27 NOTE — Assessment & Plan Note (Signed)
Chronic, stable. Continue current regimen. 

## 2017-03-27 NOTE — Assessment & Plan Note (Signed)
DRE reassuring. Recheck PSA with free PSA. If persistently elevated, discussed urology referral. Pt agrees with plan.

## 2017-03-27 NOTE — Assessment & Plan Note (Signed)
Recently started CPAP ([facility]ar). Encouraged use.

## 2017-03-27 NOTE — Assessment & Plan Note (Signed)
Chronic, stable. Continue metformin 1000mg  bid. RTC 6 mo DM f/u visit.

## 2017-03-27 NOTE — Assessment & Plan Note (Signed)
Not eligible for lung cancer screening CT as he quit > 15 yrs ago. Check CXR today.

## 2017-03-28 ENCOUNTER — Encounter: Payer: Self-pay | Admitting: Family Medicine

## 2017-03-28 LAB — PSA, TOTAL WITH REFLEX TO PSA, FREE: PSA, Total: 6.3 ng/mL — ABNORMAL HIGH (ref <=4.0)

## 2017-03-28 LAB — REFLEX PSA, FREE
PSA, % Free: 19 % (calc) — ABNORMAL LOW (ref >25)
PSA, Free: 1.2 ng/mL

## 2017-03-30 ENCOUNTER — Other Ambulatory Visit: Payer: Self-pay | Admitting: Family Medicine

## 2017-03-30 DIAGNOSIS — R972 Elevated prostate specific antigen [PSA]: Secondary | ICD-10-CM

## 2017-04-01 ENCOUNTER — Ambulatory Visit: Payer: Managed Care, Other (non HMO) | Admitting: Podiatry

## 2017-04-02 DIAGNOSIS — R972 Elevated prostate specific antigen [PSA]: Secondary | ICD-10-CM | POA: Diagnosis not present

## 2017-04-16 ENCOUNTER — Ambulatory Visit: Payer: Managed Care, Other (non HMO) | Admitting: Neurology

## 2017-04-22 ENCOUNTER — Ambulatory Visit (INDEPENDENT_AMBULATORY_CARE_PROVIDER_SITE_OTHER): Payer: Managed Care, Other (non HMO) | Admitting: Podiatry

## 2017-04-22 ENCOUNTER — Encounter: Payer: Self-pay | Admitting: Podiatry

## 2017-04-22 DIAGNOSIS — M2011 Hallux valgus (acquired), right foot: Secondary | ICD-10-CM

## 2017-04-22 DIAGNOSIS — E1142 Type 2 diabetes mellitus with diabetic polyneuropathy: Secondary | ICD-10-CM | POA: Diagnosis not present

## 2017-04-22 DIAGNOSIS — B351 Tinea unguium: Secondary | ICD-10-CM | POA: Diagnosis not present

## 2017-04-22 DIAGNOSIS — M2012 Hallux valgus (acquired), left foot: Secondary | ICD-10-CM

## 2017-04-22 DIAGNOSIS — M79676 Pain in unspecified toe(s): Secondary | ICD-10-CM | POA: Diagnosis not present

## 2017-04-22 LAB — HM DIABETES FOOT EXAM

## 2017-04-22 NOTE — Progress Notes (Signed)
Complaint:  Visit Type: Patient returns to my office for continued preventative foot care services. Complaint: Patient states" my nails have grown long and thick and become painful to walk and wear shoes" Patient has been diagnosed with DM with neuropathy. The patient presents for preventative foot care services. No changes to ROS  Podiatric Exam: Vascular: dorsalis pedis and posterior tibial pulses are palpable bilateral. Capillary return is immediate. Temperature gradient is WNL. Skin turgor WNL  Sensorium: Absent  Semmes Weinstein monofilament test. Diminished  tactile sensation bilaterally. Nail Exam: Pt has thick disfigured discolored nails with subungual debris noted bilateral entire nail hallux through fifth toenails Ulcer Exam: There is no evidence of ulcer or pre-ulcerative changes or infection. Orthopedic Exam: Muscle tone and strength are WNL. No limitations in general ROM. No crepitus or effusions noted. HAV with hallux malleus. Tailors Bunion  B/L Skin: No Porokeratosis. No infection or ulcers  Diagnosis:  Onychomycosis, , Pain in right toe, pain in left toes,  DPN  HAV with hallux malleus.  Tailors bunion  B/L  Treatment & Plan Procedures and Treatment: Consent by patient was obtained for treatment procedures. The patient understood the discussion of treatment and procedures well. All questions were answered thoroughly reviewed. Debridement of mycotic and hypertrophic toenails, 1 through 5 bilateral and clearing of subungual debris. No ulceration, no infection noted.To mold his feet for diabetic shoes next time. Return Visit-Office Procedure: Patient instructed to return to the office for a follow up visit 3 months for continued evaluation and treatment.    Gardiner Barefoot DPM

## 2017-04-23 DIAGNOSIS — R972 Elevated prostate specific antigen [PSA]: Secondary | ICD-10-CM | POA: Diagnosis not present

## 2017-05-10 ENCOUNTER — Other Ambulatory Visit: Payer: Self-pay | Admitting: Family Medicine

## 2017-05-10 ENCOUNTER — Other Ambulatory Visit: Payer: Self-pay | Admitting: Cardiovascular Disease

## 2017-05-13 ENCOUNTER — Ambulatory Visit: Payer: Managed Care, Other (non HMO)

## 2017-05-13 ENCOUNTER — Ambulatory Visit (INDEPENDENT_AMBULATORY_CARE_PROVIDER_SITE_OTHER): Payer: Managed Care, Other (non HMO)

## 2017-05-13 VITALS — BP 122/76 | HR 59 | Temp 97.9°F | Ht 68.0 in | Wt 243.2 lb

## 2017-05-13 DIAGNOSIS — Z0000 Encounter for general adult medical examination without abnormal findings: Secondary | ICD-10-CM | POA: Diagnosis not present

## 2017-05-13 NOTE — Progress Notes (Signed)
PCP notes:   Health maintenance:  No gaps identified.  Abnormal screenings:   Mini-Cog score: 17/20 MMSE - Mini Mental State Exam 05/13/2017  Orientation to time 5  Orientation to Place 5  Registration 3  Attention/ Calculation 0  Recall 2  Recall-comments unable to recall 1 of 3 words  Language- name 2 objects 0  Language- repeat 1  Language- follow 3 step command 1  Language- follow 3 step command-comments unable to follow 1 step of 3 step command  Language- read & follow direction 0  Write a sentence 0  Copy design 0  Total score 17    Patient concerns:   Rash on both arms. PCP notified. After assessment, PCP advised pt to take Claritin 10 mg 1 tablet daily. Pt was also instructed to contact PCP if rash spreads to other regions of body or if rash becomes painful.   Nurse concerns:  None  Next PCP appt:    05/20/17 @ 0915

## 2017-05-13 NOTE — Progress Notes (Signed)
I reviewed health advisor's note, was available for consultation, and agree with documentation and plan.  

## 2017-05-13 NOTE — Patient Instructions (Signed)
[name] , Thank you for taking time to come for your Medicare Wellness Visit. I appreciate your ongoing commitment to your health goals. Please review the following plan we discussed and let me know if I can assist you in the future.   These are the goals we discussed: Goals    . Increase physical activity     Starting 05/13/2017, I will continue to walk 45-60 minutes daily.        This is a list of the screening recommended for you and due dates:  Health Maintenance  Topic Date Due  . DTaP/Tdap/Td vaccine (1 - Tdap) 10/26/2025*  . Complete foot exam   07/10/2017  . Flu Shot  08/29/2017  . Hemoglobin A1C  09/18/2017  . Eye exam for diabetics  12/12/2017  . Colon Cancer Screening  08/08/2019  . Tetanus Vaccine  10/26/2025  .  Hepatitis C: One time screening is recommended by Center for Disease Control  (CDC) for  adults born from [birth year] through 1967.   Completed  . Pneumonia vaccines  Completed  *Topic was postponed. The date shown is not the original due date.   Preventive Care for Adults  A healthy lifestyle and preventive care can promote health and wellness. Preventive health guidelines for adults include the following key practices.  . A routine yearly physical is a good way to check with your health care provider about your health and preventive screening. It is a chance to share any concerns and updates on your health and to receive a thorough exam.  . Visit your dentist for a routine exam and preventive care every 6 months. Brush your teeth twice a day and floss once a day. Good oral hygiene prevents tooth decay and gum disease.  . The frequency of eye exams is based on your age, health, family medical history, use  of contact lenses, and other factors. Follow your health care provider's recommendations for frequency of eye exams.  . Eat a healthy diet. Foods like vegetables, fruits, whole grains, low-fat dairy products, and lean protein foods contain the nutrients you need  without too many calories. Decrease your intake of foods high in solid fats, added sugars, and salt. Eat the right amount of calories for you. Get information about a proper diet from your health care provider, if necessary.  . Regular physical exercise is one of the most important things you can do for your health. Most adults should get at least 150 minutes of moderate-intensity exercise (any activity that increases your heart rate and causes you to sweat) each week. In addition, most adults need muscle-strengthening exercises on 2 or more days a week.  Silver Sneakers may be a benefit available to you. To determine eligibility, you may visit the website: www.silversneakers.com or contact program at 848-103-3079 Mon-Fri between 8AM-8PM.   . Maintain a healthy weight. The body mass index (BMI) is a screening tool to identify possible weight problems. It provides an estimate of body fat based on height and weight. Your health care provider can find your BMI and can help you achieve or maintain a healthy weight.   For adults 20 years and older: ? A BMI below 18.5 is considered underweight. ? A BMI of 18.5 to 24.9 is normal. ? A BMI of 25 to 29.9 is considered overweight. ? A BMI of 30 and above is considered obese.   . Maintain normal blood lipids and cholesterol levels by exercising and minimizing your intake of saturated fat. Eat a  balanced diet with plenty of fruit and vegetables. Blood tests for lipids and cholesterol should begin at age 41 and be repeated every 5 years. If your lipid or cholesterol levels are high, you are over 50, or you are at high risk for heart disease, you may need your cholesterol levels checked more frequently. Ongoing high lipid and cholesterol levels should be treated with medicines if diet and exercise are not working.  . If you smoke, find out from your health care provider how to quit. If you do not use tobacco, please do not start.  . If you choose to drink  alcohol, please do not consume more than 2 drinks per day. One drink is considered to be 12 ounces (355 mL) of beer, 5 ounces (148 mL) of wine, or 1.5 ounces (44 mL) of liquor.  . If you are 74-95 years old, ask your health care provider if you should take aspirin to prevent strokes.  . Use sunscreen. Apply sunscreen liberally and repeatedly throughout the day. You should seek shade when your shadow is shorter than you. Protect yourself by wearing long sleeves, pants, a wide-brimmed hat, and sunglasses year round, whenever you are outdoors.  . Once a month, do a whole body skin exam, using a mirror to look at the skin on your back. Tell your health care provider of new moles, moles that have irregular borders, moles that are larger than a pencil eraser, or moles that have changed in shape or color.

## 2017-05-13 NOTE — Progress Notes (Signed)
Subjective:   Casey Golden is a 67 y.o. male who presents for an Initial Medicare Annual Wellness Visit.  Review of Systems   Cardiac Risk Factors include: advanced age (>62men, >19 women);male gender;diabetes mellitus;obesity (BMI >30kg/m2);dyslipidemia;hypertension    Objective:    Today's Vitals   05/13/17 0955  BP: 122/76  Pulse: (!) 59  Temp: 97.9 F (36.6 C)  TempSrc: Oral  SpO2: 96%  Weight: 243 lb 4 oz (110.3 kg)  Height: 5\' 8"  (1.727 m)  PainSc: 0-No pain   Body mass index is 36.99 kg/m.  Advanced Directives 05/13/2017 08/07/2016 04/17/2016 01/03/2016 09/14/2014 08/11/2014  Does Patient Have a Medical Advance Directive? Yes No No No No No  Type of Advance Directive Living will - - - - -  Would patient like information on creating a medical advance directive? - No - Patient declined No - Patient declined No - Patient declined No - patient declined information No - patient declined information    Current Medications (verified) Outpatient Encounter Medications as of 05/13/2017  Medication Sig  . albuterol (PROVENTIL HFA;VENTOLIN HFA) 108 (90 Base) MCG/ACT inhaler Inhale 2 puffs into the lungs every 6 (six) hours as needed for wheezing.  Marland Kitchen aspirin 81 MG EC tablet Take 1 tablet (81 mg total) by mouth daily.  Marland Kitchen atenolol (TENORMIN) 25 MG tablet TAKE 1 TABLET BY MOUTH EVERY DAY  . atorvastatin (LIPITOR) 20 MG tablet TAKE 1 TABLET (20 MG TOTAL) BY MOUTH DAILY.  Marland Kitchen diclofenac sodium (VOLTAREN) 1 % GEL APPLY 2 G TOPICALLY 3 (THREE) TIMES DAILY AS NEEDED (ANTI INFLAMMATORY).  Marland Kitchen gabapentin (NEURONTIN) 300 MG capsule Take 1 capsule (300 mg total) by mouth 2 (two) times daily. Take 1 caps in AM and 1 caps at bedtime along with 100mg  cap (total 400mg )  . ibuprofen (ADVIL,MOTRIN) 200 MG tablet Take 400-600 mg by mouth every 6 (six) hours as needed for mild pain (DEPENDS ON PAIN IF TAKES 400-600 MG).   Marland Kitchen losartan (COZAAR) 100 MG tablet TAKE 1 TABLET EVERY DAY  . metFORMIN  (GLUCOPHAGE) 1000 MG tablet TAKE 1 TABLET TWICE DAILY WITH A MEAL  . ONE TOUCH LANCETS MISC Use to check sugar once daily and as needed. Dx: E11.40, E11.65 **One Touch Ultra per insurance  . ONE TOUCH ULTRA TEST test strip USE TO CHECK SUGAR ONCE DAILY AND AS NEEDED   No facility-administered encounter medications on file as of 05/13/2017.     Allergies (verified) Sulfa antibiotics   History: Past Medical History:  Diagnosis Date  . Arthritis   . CAD (coronary artery disease)    Cardiologist--  Dr. Ida Rogue  . Colonic polyp   . Fatty liver disease, nonalcoholic 2409   by [place]  . GERD (gastroesophageal reflux disease)   . History of chicken pox   . Hyperlipidemia   . Hypertension   . Nocturia   . OSA (obstructive sleep apnea)    per pt no cpap due to sleep center/ insurance issue-- study done 2014  . Rash of genital area    09-08-2014  per pt Dr Junious Silk aware  . Seasonal and perennial allergic rhinitis   . Type 2 diabetes mellitus ([place])   . Wears dentures    full upper/  partial lower  . Wears glasses    Past Surgical History:  Procedure Laterality Date  . ABDOMINAL HERNIA REPAIR  2007      [facility]   open repair  . APPENDECTOMY  age 45  . CARDIAC CATHETERIZATION  12-23-2007   [facility]   Abnormal myoview w/ ischemia/  40% mRCA with nonobstructive and no sig. plaque in his left system, EF 55%  . CARDIAC CATHETERIZATION  Apr 2008    [facility]   Abnormal myoview/  50% RCA,  ef 65%  . CARDIAC CATHETERIZATION  1999      [facility]  . CERVICAL FUSION  1992  . CHOLECYSTECTOMY OPEN  2006  . COLONOSCOPY WITH PROPOFOL N/A 04/17/2016   TAs, high grade dysplasia with margins clear, diverticulosis Lucilla Lame, MD)  . COLONOSCOPY WITH PROPOFOL N/A 08/07/2016   TAx1, diverticulosis, rpt 3 yrs ([place])  . DENTAL SURGERY     metal dental implant L mandible  . EXCISION OF SKIN TAG Right 09/14/2014   Procedure: EXCISION OF SKIN TAG;  Surgeon: Festus Aloe, MD;  Location: [facility];  Service: Urology;  Laterality: Right;  . HYDROCELE EXCISION Left 09/14/2014   Procedure: LEFT HYDROCELECTOMY ADULT;  Surgeon: Festus Aloe, MD;  Location: [facility];  Service: Urology;  Laterality: Left;  . MOHS SURGERY  2015   skin cancer  . TONSILLECTOMY  age 55   Family History  Problem Relation Age of Onset  . CAD Mother        MI  . Diabetes Mother   . Stroke Mother        mini-stroke  . Cancer Father 18       lung (smoker)  . Heart disease Paternal Grandmother   . Heart disease Paternal Grandfather   . Cancer Sister        lung  . Coronary artery disease Neg Hx        Premature   Social History   Socioeconomic History  . Marital status: Married    Spouse name: Not on file  . Number of children: Not on file  . Years of education: Not on file  . Highest education level: Not on file  Occupational History  . Occupation: Full time    Employer: [employer]  Social Needs  . Financial resource strain: Not on file  . Food insecurity:    Worry: Not on file    Inability: Not on file  . Transportation needs:    Medical: Not on file    Non-medical: Not on file  Tobacco Use  . Smoking status: Former Smoker    Packs/day: 2.00    Years: 35.00    Pack years: 70.00    Types: Cigarettes    Last attempt to quit: 01/29/1990    Years since quitting: 27.3  . Smokeless tobacco: Never Used  Substance and Sexual Activity  . Alcohol use: No  . Drug use: No  . Sexual activity: Yes  Lifestyle  . Physical activity:    Days per week: Not on file    Minutes per session: Not on file  . Stress: Not on file  Relationships  . Social connections:    Talks on phone: Not on file    Gets together: Not on file    Attends religious service: Not on file    Active member of club or organization: Not on file    Attends meetings of clubs or organizations: Not on file    Relationship status: Not on file  Other Topics Concern  . Not on file  Social  History Narrative   Lives with wife, dog and cats    Occupation: retired, was self employed, now works at [employer]    Edu: HS  Activity: walks 1.5 mi daily   Diet: some water, fruits/vegetables daily   Tobacco Counseling Counseling given: No   Clinical Intake:  Pre-visit preparation completed: Yes  Pain : No/denies pain Pain Score: 0-No pain     Nutritional Status: BMI > 30  Obese Nutritional Risks: None Diabetes: Yes CBG done?: No Did pt. bring in CBG monitor from home?: No  How often do you need to have someone help you when you read instructions, pamphlets, or other written materials from your doctor or pharmacy?: 1 - Never What is the last grade level you completed in school?: 12th grade  Interpreter Needed?: No  Comments: pt lives with spouse Information entered by :: LPinson, LPN  Activities of Daily Living In your present state of health, do you have any difficulty performing the following activities: 05/13/2017  Hearing? Y  Vision? N  Difficulty concentrating or making decisions? N  Walking or climbing stairs? N  Dressing or bathing? N  Doing errands, shopping? N  Preparing Food and eating ? N  Using the Toilet? N  In the past six months, have you accidently leaked urine? N  Do you have problems with loss of bowel control? N  Managing your Medications? N  Managing your Finances? N  Housekeeping or managing your Housekeeping? N  Some recent data might be hidden     Immunizations and Health Maintenance Immunization History  Administered Date(s) Administered  . Influenza, High Dose Seasonal PF 11/30/2013  . Influenza,inj,Quad PF,6+ Mos 10/15/2012, 09/30/2015  . Influenza-Unspecified 11/01/2014, 10/13/2016  . Pneumococcal Conjugate-13 09/05/2015  . Pneumococcal Polysaccharide-23 01/30/2011, 10/13/2016, 10/18/2016  . Td 10/27/2015  . Zoster 09/15/2012   There are no preventive care reminders to display for this patient.  Patient Care  Team: Ria Bush, MD as PCP - General (Family Medicine) Rockey Situ Kathlene November, MD as Consulting Physician (Cardiology)  Indicate any recent Medical Services you may have received from other than [facility] providers in the past year (date may be approximate).    Assessment:   This is a routine wellness examination for Casey Golden.  Hearing/Vision screen Hearing Screening Comments: Formal hearing exam in Oct 2018; candidate for hearing aids Vision Screening Comments: Vision exam in Nov 2018 with Dr. Chauncey Fischer  Dietary issues and exercise activities discussed: Current Exercise Habits: Home exercise routine, Type of exercise: walking, Time (Minutes): 60, Frequency (Times/Week): 7, Weekly Exercise (Minutes/Week): 420, Intensity: Mild, Exercise limited by: None identified  Goals    . Increase physical activity     Starting 05/13/2017, I will continue to walk 45-60 minutes daily.       Depression Screen PHQ 2/9 Scores 05/13/2017 03/27/2017 08/11/2014  PHQ - 2 Score 0 0 0  PHQ- 9 Score 0 - -    Fall Risk Fall Risk  05/13/2017 08/11/2014  Falls in the past year? No No    Cognitive Function: MMSE - Mini Mental State Exam 05/13/2017  Orientation to time 5  Orientation to Place 5  Registration 3  Attention/ Calculation 0  Recall 2  Recall-comments unable to recall 1 of 3 words  Language- name 2 objects 0  Language- repeat 1  Language- follow 3 step command 1  Language- follow 3 step command-comments unable to follow 1 step of 3 step command  Language- read & follow direction 0  Write a sentence 0  Copy design 0  Total score 17       PLEASE NOTE: A Mini-Cog screen was completed. Maximum score is 20. A value of  0 denotes this part of [place] MMSE was not completed or the patient failed this part of the Mini-Cog screening.   Mini-Cog Screening Orientation to Time - Max 5 pts Orientation to Place - Max 5 pts Registration - Max 3 pts Recall - Max 3 pts Language Repeat - Max 1  pts Language Follow 3 Step Command - Max 3 pts   Screening Tests Health Maintenance  Topic Date Due  . DTaP/Tdap/Td (1 - Tdap) 10/26/2025 (Originally 10/28/2015)  . FOOT EXAM  07/10/2017  . INFLUENZA VACCINE  08/29/2017  . HEMOGLOBIN A1C  09/18/2017  . OPHTHALMOLOGY EXAM  12/12/2017  . COLONOSCOPY  08/08/2019  . TETANUS/TDAP  10/26/2025  . Hepatitis C Screening  Completed  . PNA vac Low Risk Adult  Completed       Plan:     I have personally reviewed, addressed, and noted the following in the patient's chart:  A. Medical and social history B. Use of alcohol, tobacco or illicit drugs  C. Current medications and supplements D. Functional ability and status E.  Nutritional status F.  Physical activity G. Advance directives H. List of other physicians I.  Hospitalizations, surgeries, and ER visits in previous 12 months J.  [place] to include hearing, vision, cognitive, depression L. Referrals and appointments - none  In addition, I have reviewed and discussed with patient certain preventive protocols, quality metrics, and best practice recommendations. A written personalized care plan for preventive services as well as general preventive health recommendations were provided to patient.  See attached scanned questionnaire for additional information.   Signed,   Lindell Noe, MHA, BS, LPN Health Coach

## 2017-05-20 ENCOUNTER — Encounter: Payer: Self-pay | Admitting: Family Medicine

## 2017-05-20 ENCOUNTER — Ambulatory Visit (INDEPENDENT_AMBULATORY_CARE_PROVIDER_SITE_OTHER): Payer: Managed Care, Other (non HMO) | Admitting: Family Medicine

## 2017-05-20 VITALS — BP 124/76 | HR 64 | Temp 98.0°F | Ht 68.0 in | Wt 242.0 lb

## 2017-05-20 DIAGNOSIS — R21 Rash and other nonspecific skin eruption: Secondary | ICD-10-CM | POA: Diagnosis not present

## 2017-05-20 DIAGNOSIS — H6121 Impacted cerumen, right ear: Secondary | ICD-10-CM | POA: Diagnosis not present

## 2017-05-20 DIAGNOSIS — H6120 Impacted cerumen, unspecified ear: Secondary | ICD-10-CM | POA: Insufficient documentation

## 2017-05-20 DIAGNOSIS — I2510 Atherosclerotic heart disease of native coronary artery without angina pectoris: Secondary | ICD-10-CM

## 2017-05-20 NOTE — Progress Notes (Signed)
BP 124/76 (BP Location: Left Arm, Patient Position: Sitting, Cuff Size: Normal)   Pulse 64   Temp 98 F (36.7 C) (Oral)   Ht 5\' 8"  (1.727 m)   Wt 242 lb (109.8 kg)   SpO2 95%   BMI 36.80 kg/m    CC: rash, earwax Subjective:    Patient ID: Casey Golden, male    DOB: 12-23-1950, 67 y.o.   MRN: [id number]  HPI: Casey Golden is a 67 y.o. male presenting on 05/20/2017 for Rash (Here for follow-up. States rash is improving.) and Earwax removal   Saw Lesia last week for medicare wellness visit. Note reviewed.  Had urticarial rash - claritin and calomine lotion started with good improvement. Only arms were affected, small amt at ankles. Unclear etiology. ?jacket or pineapple. No poison oak exposure.  No exam data present  Hearing test was not attempted last week due to wax buildup.   Relevant past medical, surgical, family and social history reviewed and updated as indicated. Interim medical history since our last visit reviewed. Allergies and medications reviewed and updated. Outpatient Medications Prior to Visit  Medication Sig Dispense Refill  . albuterol (PROVENTIL HFA;VENTOLIN HFA) 108 (90 Base) MCG/ACT inhaler Inhale 2 puffs into the lungs every 6 (six) hours as needed for wheezing. 1 Inhaler 2  . aspirin 81 MG EC tablet Take 1 tablet (81 mg total) by mouth daily. 30 tablet 12  . atenolol (TENORMIN) 25 MG tablet TAKE 1 TABLET BY MOUTH EVERY DAY 90 tablet 3  . atorvastatin (LIPITOR) 20 MG tablet TAKE 1 TABLET (20 MG TOTAL) BY MOUTH DAILY. 90 tablet 3  . diclofenac sodium (VOLTAREN) 1 % GEL APPLY 2 G TOPICALLY 3 (THREE) TIMES DAILY AS NEEDED (ANTI INFLAMMATORY). 100 g 1  . gabapentin (NEURONTIN) 300 MG capsule Take 1 capsule (300 mg total) by mouth 2 (two) times daily. Take 1 caps in AM and 1 caps at bedtime along with 100mg  cap (total 400mg ) 180 capsule 3  . ibuprofen (ADVIL,MOTRIN) 200 MG tablet Take 400-600 mg by mouth every 6 (six) hours as needed for mild pain (DEPENDS ON  PAIN IF TAKES 400-600 MG).     Marland Kitchen loratadine (CLARITIN) 10 MG tablet Take 10 mg by mouth daily as needed for itching (hives).    . losartan (COZAAR) 100 MG tablet TAKE 1 TABLET EVERY DAY 90 tablet 3  . metFORMIN (GLUCOPHAGE) 1000 MG tablet TAKE 1 TABLET TWICE DAILY WITH A MEAL 180 tablet 1  . ONE TOUCH LANCETS MISC Use to check sugar once daily and as needed. Dx: E11.40, E11.65 **One Touch Ultra per insurance 100 each 1  . ONE TOUCH ULTRA TEST test strip USE TO CHECK SUGAR ONCE DAILY AND AS NEEDED 50 each 1   No facility-administered medications prior to visit.      Per HPI unless specifically indicated in ROS section below Review of Systems     Objective:    BP 124/76 (BP Location: Left Arm, Patient Position: Sitting, Cuff Size: Normal)   Pulse 64   Temp 98 F (36.7 C) (Oral)   Ht 5\' 8"  (1.727 m)   Wt 242 lb (109.8 kg)   SpO2 95%   BMI 36.80 kg/m   Wt Readings from Last 3 Encounters:  05/20/17 242 lb (109.8 kg)  05/13/17 243 lb 4 oz (110.3 kg)  03/27/17 236 lb (107 kg)    Physical Exam  Constitutional: He appears well-developed and well-nourished. No distress.  HENT:  Right Ear:  Hearing, tympanic membrane, external ear and ear canal normal.  Left Ear: Hearing, tympanic membrane, external ear and ear canal normal.  Small amt cerumen remains in right canal - cleaned with plastic curette, pt tolerated well.   Skin: Skin is warm. No rash noted. No erythema.  Nursing note and vitals reviewed.  Results for orders placed or performed in visit on 03/28/17  HM DIABETES EYE EXAM  Result Value Ref Range   HM Diabetic Eye Exam No Retinopathy No Retinopathy      Assessment & Plan:   Problem List Items Addressed This Visit    Cerumen impaction    Cerumen disimpaction performed and pt tolerated well.       Skin rash - Primary    Urticarial arm rash has improved with claritin and calamine lotion.           No orders of the defined types were placed in this encounter.  No  orders of the defined types were placed in this encounter.   Follow up plan: No follow-ups on file.  Ria Bush, MD

## 2017-05-20 NOTE — Assessment & Plan Note (Signed)
Cerumen disimpaction performed and pt tolerated well.

## 2017-05-20 NOTE — Assessment & Plan Note (Signed)
Urticarial arm rash has improved with claritin and calamine lotion.

## 2017-05-20 NOTE — Patient Instructions (Addendum)
I'm glad rash is doing better.  Ears looking ok today - R ear cleaned out.  Continue debrox as needed.

## 2017-05-23 DIAGNOSIS — M1710 Unilateral primary osteoarthritis, unspecified knee: Secondary | ICD-10-CM | POA: Insufficient documentation

## 2017-05-23 DIAGNOSIS — M179 Osteoarthritis of knee, unspecified: Secondary | ICD-10-CM | POA: Insufficient documentation

## 2017-05-28 ENCOUNTER — Ambulatory Visit: Payer: Managed Care, Other (non HMO) | Admitting: Neurology

## 2017-06-09 ENCOUNTER — Other Ambulatory Visit: Payer: Self-pay | Admitting: Family Medicine

## 2017-06-17 ENCOUNTER — Ambulatory Visit (INDEPENDENT_AMBULATORY_CARE_PROVIDER_SITE_OTHER): Payer: Managed Care, Other (non HMO) | Admitting: Family Medicine

## 2017-06-17 ENCOUNTER — Encounter: Payer: Self-pay | Admitting: Family Medicine

## 2017-06-17 VITALS — BP 116/68 | HR 60 | Temp 98.5°F | Ht 68.0 in | Wt 247.5 lb

## 2017-06-17 DIAGNOSIS — I2510 Atherosclerotic heart disease of native coronary artery without angina pectoris: Secondary | ICD-10-CM

## 2017-06-17 DIAGNOSIS — J019 Acute sinusitis, unspecified: Secondary | ICD-10-CM | POA: Insufficient documentation

## 2017-06-17 DIAGNOSIS — J0100 Acute maxillary sinusitis, unspecified: Secondary | ICD-10-CM

## 2017-06-17 MED ORDER — AMOXICILLIN-POT CLAVULANATE 875-125 MG PO TABS: 1.0000 | ORAL_TABLET | Freq: Two times a day (BID) | ORAL | 0 refills | Status: DC

## 2017-06-17 MED ORDER — BENZONATATE 200 MG PO CAPS: 200.0000 mg | ORAL_CAPSULE | Freq: Three times a day (TID) | ORAL | 1 refills | Status: DC | PRN

## 2017-06-17 NOTE — Assessment & Plan Note (Signed)
S/p uri with cough  augmentin  Tessalon  otc cold formula -he has used prn  Watch for fever  Watch for inc cough or wheeze Reassuring exam  Disc symptomatic care - see instructions on AVS  Update if not starting to improve in a week or if worsening

## 2017-06-17 NOTE — Patient Instructions (Signed)
For sinus infection take the augmentin  Drink lots of water  Rest when you can   Try the tessalon for cough   The over the counter medicine you use is also fine   Nasal saline spray helps also   Update if not starting to improve in a week or if worsening   If more wheezing-call and let us know

## 2017-06-17 NOTE — Progress Notes (Signed)
Subjective:    Patient ID: Casey Golden, male    DOB: 1950/03/12, 67 y.o.   MRN: [id number]  HPI 67 yo pt of Dr Darnell Level here with cough/congestion and elevated temp  H/o DM2 and CAD and prior smoker   Symptoms for a week  Cough- productive - small amt of yellow phlegm  Chest is sore from coughing  Mild ha  Mild ST  Nasal congestion   Temp up and down -fine now--highest about 101  No chills occ achey  Took some children's tylenol formula for cold and cough  No other otc medicines   Using inhaler about twice daily -occ wheezing    Temp: 98.5 F (36.9 C)   Did get a flu shot this season   Patient Active Problem List   Diagnosis Date Noted  . Acute sinusitis 06/17/2017  . Cerumen impaction 05/20/2017  . Ex-smoker 03/27/2017  . Elevated PSA 03/27/2017  . Personal history of colonic polyps   . Stress due to family tension 05/15/2016  . Skin rash 05/15/2016  . Hx of colonic polyps   . Benign neoplasm of transverse colon   . Polyp of sigmoid colon   . Benign neoplasm of descending colon   . Chronic midline low back pain 02/27/2016  . Bilateral hip pain 11/02/2015  . Right knee pain 07/12/2015  . Health maintenance examination 03/07/2015  . Severe obesity (BMI 35.0-39.9) with comorbidity ([place]) 03/07/2015  . Advanced care planning/counseling discussion 03/07/2015  . Controlled diabetes mellitus type 2 with complications ([place]) 02/58/5277  . Dyslipidemia associated with type 2 diabetes mellitus ([place]) 05/06/2014  . OSA (obstructive sleep apnea) 05/06/2014  . Fatty liver disease, nonalcoholic 82/42/3536  . H/O malignant neoplasm of skin 01/01/2013  . HTN (hypertension) 02/15/2009  . CAD (coronary artery disease), native coronary artery 02/15/2009  . Shortness of breath 02/15/2009   Past Medical History:  Diagnosis Date  . Arthritis   . CAD (coronary artery disease)    Cardiologist--  Dr. Ida Rogue  . Colonic polyp   . Fatty liver disease, nonalcoholic 1443   by  [place]  . GERD (gastroesophageal reflux disease)   . History of chicken pox   . Hyperlipidemia   . Hypertension   . Nocturia   . OSA (obstructive sleep apnea)    per pt no cpap due to sleep center/ insurance issue-- study done 2014  . Rash of genital area    09-08-2014  per pt Dr Junious Silk aware  . Seasonal and perennial allergic rhinitis   . Type 2 diabetes mellitus ([place])   . Wears dentures    full upper/  partial lower  . Wears glasses    Past Surgical History:  Procedure Laterality Date  . ABDOMINAL HERNIA REPAIR  2007      [facility]   open repair  . APPENDECTOMY  age 4  . CARDIAC CATHETERIZATION  12-23-2007   [facility]   Abnormal myoview w/ ischemia/  40% mRCA with nonobstructive and no sig. plaque in his left system, EF 55%  . CARDIAC CATHETERIZATION  Apr 2008    [facility]   Abnormal myoview/  50% RCA,  ef 65%  . CARDIAC CATHETERIZATION  1999      [facility]  . CERVICAL FUSION  1992  . CHOLECYSTECTOMY OPEN  2006  . COLONOSCOPY WITH PROPOFOL N/A 04/17/2016   TAs, high grade dysplasia with margins clear, diverticulosis Lucilla Lame, MD)  . COLONOSCOPY WITH PROPOFOL N/A 08/07/2016   TAx1, diverticulosis, rpt 3 yrs (  [facility])  . DENTAL SURGERY     metal dental implant L mandible  . EXCISION OF SKIN TAG Right 09/14/2014   Procedure: EXCISION OF SKIN TAG;  Surgeon: Festus Aloe, MD;  Location[facility];  Service: Urology;  Laterality: Right;  . HYDROCELE EXCISION Left 09/14/2014   Procedure: LEFT HYDROCELECTOMY ADULT;  Surgeon: Festus Aloe, MD;  Location: [facility];  Service: Urology;  Laterality: Left;  . MOHS SURGERY  2015   skin cancer  . TONSILLECTOMY  age 73   Social History   Tobacco Use  . Smoking status: Former Smoker    Packs/day: 2.00    Years: 35.00    Pack years: 70.00    Types: Cigarettes    Last attempt to quit: 01/29/1990    Years since quitting: 27.4  . Smokeless tobacco: Never Used  Substance Use Topics  . Alcohol use: No  . Drug use:  No   Family History  Problem Relation Age of Onset  . CAD Mother        MI  . Diabetes Mother   . Stroke Mother        mini-stroke  . Cancer Father 41       lung (smoker)  . Heart disease Paternal Grandmother   . Heart disease Paternal Grandfather   . Cancer Sister        lung  . Coronary artery disease Neg Hx        Premature   Allergies  Allergen Reactions  . Sulfa Antibiotics Hives   Current Outpatient Medications on File Prior to Visit  Medication Sig Dispense Refill  . albuterol (PROVENTIL HFA;VENTOLIN HFA) 108 (90 Base) MCG/ACT inhaler Inhale 2 puffs into the lungs every 6 (six) hours as needed for wheezing. 1 Inhaler 2  . aspirin 81 MG EC tablet Take 1 tablet (81 mg total) by mouth daily. 30 tablet 12  . atenolol (TENORMIN) 25 MG tablet TAKE 1 TABLET BY MOUTH EVERY DAY 90 tablet 3  . atorvastatin (LIPITOR) 20 MG tablet TAKE 1 TABLET (20 MG TOTAL) BY MOUTH DAILY. 90 tablet 3  . diclofenac sodium (VOLTAREN) 1 % GEL APPLY 2 G TOPICALLY 3 (THREE) TIMES DAILY AS NEEDED (ANTI INFLAMMATORY). 100 g 5  . gabapentin (NEURONTIN) 300 MG capsule Take 1 capsule (300 mg total) by mouth 2 (two) times daily. Take 1 caps in AM and 1 caps at bedtime along with 100mg  cap (total 400mg ) 180 capsule 3  . ibuprofen (ADVIL,MOTRIN) 200 MG tablet Take 400-600 mg by mouth every 6 (six) hours as needed for mild pain (DEPENDS ON PAIN IF TAKES 400-600 MG).     Marland Kitchen loratadine (CLARITIN) 10 MG tablet Take 10 mg by mouth daily as needed for itching (hives).    . losartan (COZAAR) 100 MG tablet TAKE 1 TABLET EVERY DAY 90 tablet 3  . metFORMIN (GLUCOPHAGE) 1000 MG tablet TAKE 1 TABLET TWICE DAILY WITH A MEAL 180 tablet 1  . ONE TOUCH LANCETS MISC Use to check sugar once daily and as needed. Dx: E11.40, E11.65 **One Touch Ultra per insurance 100 each 1  . ONE TOUCH ULTRA TEST test strip USE TO CHECK SUGAR ONCE DAILY AND AS NEEDED 50 each 1   No current facility-administered medications on file prior to visit.       Review of Systems  Constitutional: Positive for appetite change. Negative for fatigue and fever.  HENT: Positive for congestion, ear pain, postnasal drip, rhinorrhea, sinus pressure and sore  throat. Negative for nosebleeds.   Eyes: Negative for pain, redness and itching.  Respiratory: Positive for cough and wheezing. Negative for shortness of breath and stridor.   Cardiovascular: Negative for chest pain.  Gastrointestinal: Negative for abdominal pain, diarrhea, nausea and vomiting.  Endocrine: Negative for polyuria.  Genitourinary: Negative for dysuria, frequency and urgency.  Musculoskeletal: Negative for arthralgias and myalgias.  Allergic/Immunologic: Negative for immunocompromised state.  Neurological: Positive for headaches. Negative for dizziness, tremors, syncope, weakness and numbness.  Hematological: Negative for adenopathy. Does not bruise/bleed easily.  Psychiatric/Behavioral: Negative for dysphoric mood. The patient is not nervous/anxious.        Objective:   Physical Exam  Constitutional: He appears well-developed and well-nourished. No distress.  HENT:  Head: Normocephalic and atraumatic.  Right Ear: External ear normal.  Left Ear: External ear normal.  Mouth/Throat: Oropharynx is clear and moist. No oropharyngeal exudate.  obese and well appearing   Eyes: Pupils are equal, round, and reactive to light. Conjunctivae and EOM are normal. Right eye exhibits no discharge. Left eye exhibits no discharge.  Neck: Normal range of motion. Neck supple.  Cardiovascular: Normal rate and regular rhythm.  Pulmonary/Chest: Effort normal and breath sounds normal. No respiratory distress. He has no wheezes. He has no rales. He exhibits no tenderness.  Distant bs that are harsh  No rales /rhonchi Good air exch / no wheeze even on forced exp   Musculoskeletal: Normal range of motion.  Lymphadenopathy:    He has no cervical adenopathy.  Neurological: He is alert. No cranial nerve  deficit.  Skin: Skin is warm and dry. No rash noted. No erythema.  Psychiatric: He has a normal mood and affect.          Assessment & Plan:   Problem List Items Addressed This Visit      Respiratory   Acute sinusitis - Primary    S/p uri with cough  augmentin  Tessalon  otc cold formula -he has used prn  Watch for fever  Watch for inc cough or wheeze Reassuring exam  Disc symptomatic care - see instructions on AVS  Update if not starting to improve in a week or if worsening        Relevant Medications   amoxicillin-clavulanate (AUGMENTIN) 875-125 MG tablet   benzonatate (TESSALON) 200 MG capsule

## 2017-06-26 ENCOUNTER — Encounter: Payer: Self-pay | Admitting: Neurology

## 2017-07-01 ENCOUNTER — Ambulatory Visit (INDEPENDENT_AMBULATORY_CARE_PROVIDER_SITE_OTHER): Payer: Managed Care, Other (non HMO) | Admitting: Neurology

## 2017-07-01 ENCOUNTER — Encounter: Payer: Self-pay | Admitting: Neurology

## 2017-07-01 VITALS — BP 152/83 | HR 58 | Ht 68.5 in

## 2017-07-01 DIAGNOSIS — Z9989 Dependence on other enabling machines and devices: Secondary | ICD-10-CM | POA: Diagnosis not present

## 2017-07-01 DIAGNOSIS — G4733 Obstructive sleep apnea (adult) (pediatric): Secondary | ICD-10-CM | POA: Diagnosis not present

## 2017-07-01 DIAGNOSIS — I2510 Atherosclerotic heart disease of native coronary artery without angina pectoris: Secondary | ICD-10-CM

## 2017-07-01 NOTE — Patient Instructions (Addendum)
I would like for you to make an appointment with your DME company, [employer] is: [address] [phone]:  1. Your air is leaking too much from the mask for the autoPAP machine.  2. As discussed, we will do an overnight oxygen level test, called ONO, and your DME company will call and set this up for one night, while you also use your autoPAP as usual. We will call you with the results. This is to make sure that your oxygen levels stay in the 90s, while you are treated with autoPAP for your OSA. Remember, your oxygen levels dropped into the 70s and as low as 69% during the home sleep test. 3. Tell [facility], that the headgear bothers the back of your head, you should be able to sleep on your back and sides, maybe you need a different mask altogether.  4. If all goes well, we can see you in 6 months, you can see one of our nurse practitioners.

## 2017-07-01 NOTE — Progress Notes (Signed)
Subjective:    Patient ID: Casey Golden is a 67 y.o. male.  HPI     Interim history:   Casey Golden is a 67 year old right-handed gentleman with an underlying medical history of type 2 diabetes, allergic rhinitis, hypertension, hyperlipidemia, reflux disease, fatty liver, coronary artery disease, arthritis and obesity who presents for follow-up consultation of his obstructive sleep apnea after her recent home sleep testing and starting AutoPap therapy. The patient is unaccompanied today. I first met him on 12/04/2016 at the request of his primary care physician, at which time the patient reported a prior diagnosis of sleep apnea, he had a diagnostic test in July 2015 which indicated mild sleep apnea he had a CPAP titration study in August 2015 which determined an adequate treatment pressure of 8 cm. He had not start CPAP therapy at the time. He had a home sleep test on 01/14/2017 which indicated mild sleep apnea with an AHI of 9.5 per hour, however desaturation nadir was 69% which was significant, and time below or at 88% saturation was 24 minutes for the test time of 7 hours and 45 minutes. He was advised to start AutoPap therapy.  Today, 07/01/2017: I reviewed his AutoPap compliance data from 05/28/2017 through 06/26/2017 which is a total of 30 days, during which time he used his machine every night with percent used days greater than 4 hours at 87%, indicating very good compliance however average usage of only 5 hours and 6 minutes, residual AHI borderline at 5.5 per hour, 95th percentile pressure at 9.2 cm, leak quite high with the 95th percentile at 46.7 L/m at a pressure range of 6 cm to 13 cm with EPR. He reports having had some difficulty with the headgear, it bothers the back of his head. He tries to sleep on his back which is his preferred sleep position but the head care pushes into the back of his head and causes even soreness during the day. He has had a recent cold and bronchitis, was  treated with Augmentin for 7 days, as well as Tessalon. He continues to be compliant with treatment. He is benefiting some from the treatment with AutoPap but still struggles with discomfort from the headgear. His leak is not typically from the mouth he estimates. He tries to hydrate well.   The patient's allergies, current medications, family history, past medical history, past social history, past surgical history and problem list were reviewed and updated as appropriate.   Previously:  12/04/2016: (He) was previously diagnosed with obstructive sleep apnea. I reviewed his prior study results from 2015. He had a baseline sleep study on 07/29/2013 which showed an AHI of 11.3 per hour, 37.9 per hour during REM sleep. REM latency was 50 minutes, sleep efficiency was 80.3%. Average oxygen saturation was 96.8%, nadir was 72.6%. He had a PLM index of 51.2 with an associated arousal index of 0.5 per hour. He had a CPAP titration study on 09/16/2013. He was titrated from 5 cm to 10 cm. His optimal pressure was deemed 8 cm. His BMI at that time was 32. I reviewed your office note from 11/22/2016. He has not actually been on CPAP therapy. I reviewed your office note from 10/25/2016 as well. His Epworth sleepiness score is 10 out of 24 on the fatigue score is 15 out of 63. He lives at home with his wife. They have 3 children. He works at [employer] He quit smoking in 1990 and does not typically drink alcohol, drinks caffeine in  the form of coffee, 5 cups a day on average. His bedtime is typically around 11. By that time he has typically already taken a short nap while watching TV. His wake up time is around 5, he helps out with his grandchildren, taking them to school. He denies restless leg symptoms. He has neuropathy secondary to diabetes and is on gabapentin. He has a history of pneumonia twice or 3 times in his life, also bronchitis. He denies morning headaches but has nocturia about once or twice per average  night.  Her Past Medical History Is Significant For: Past Medical History:  Diagnosis Date  . Arthritis   . CAD (coronary artery disease)    Cardiologist--  Dr. Ida Rogue  . Colonic polyp   . Fatty liver disease, nonalcoholic 5465   by [place]  . GERD (gastroesophageal reflux disease)   . History of chicken pox   . Hyperlipidemia   . Hypertension   . Nocturia   . OSA (obstructive sleep apnea)    per pt no cpap due to sleep center/ insurance issue-- study done 2014  . Rash of genital area    09-08-2014  per pt Dr Junious Silk aware  . Seasonal and perennial allergic rhinitis   . Type 2 diabetes mellitus ([place])   . Wears dentures    full upper/  partial lower  . Wears glasses     His Past Surgical History Is Significant For: Past Surgical History:  Procedure Laterality Date  . ABDOMINAL HERNIA REPAIR  2007      [facility]   open repair  . APPENDECTOMY  age 71  . CARDIAC CATHETERIZATION  12-23-2007   [facility]   Abnormal myoview w/ ischemia/  40% mRCA with nonobstructive and no sig. plaque in his left system, EF 55%  . CARDIAC CATHETERIZATION  Apr 2008    [facility]   Abnormal myoview/  50% RCA,  ef 65%  . CARDIAC CATHETERIZATION  1999      [facility]  . CERVICAL FUSION  1992  . CHOLECYSTECTOMY OPEN  2006  . COLONOSCOPY WITH PROPOFOL N/A 04/17/2016   TAs, high grade dysplasia with margins clear, diverticulosis Casey Lame, MD)  . COLONOSCOPY WITH PROPOFOL N/A 08/07/2016   TAx1, diverticulosis, rpt 3 yrs ([facility])  . DENTAL SURGERY     metal dental implant L mandible  . EXCISION OF SKIN TAG Right 09/14/2014   Procedure: EXCISION OF SKIN TAG;  Surgeon: Festus Aloe, MD;  Location: [facility];  Service: Urology;  Laterality: Right;  . HYDROCELE EXCISION Left 09/14/2014   Procedure: LEFT HYDROCELECTOMY ADULT;  Surgeon: Festus Aloe, MD;  Location: [facility];  Service: Urology;  Laterality: Left;  . MOHS SURGERY  2015   skin cancer  . TONSILLECTOMY  age 66     His Family History Is Significant For: Family History  Problem Relation Age of Onset  . CAD Mother        MI  . Diabetes Mother   . Stroke Mother        mini-stroke  . Cancer Father 61       lung (smoker)  . Heart disease Paternal Grandmother   . Heart disease Paternal Grandfather   . Cancer Sister        lung  . Coronary artery disease Neg Hx        Premature    His Social History Is Significant For: Social History   Socioeconomic History  . Marital status: Married  Spouse name: Not on file  . Number of children: Not on file  . Years of education: Not on file  . Highest education level: Not on file  Occupational History  . Occupation: Full time    Employer: [employer]  Social Needs  . Financial resource strain: Not on file  . Food insecurity:    Worry: Not on file    Inability: Not on file  . Transportation needs:    Medical: Not on file    Non-medical: Not on file  Tobacco Use  . Smoking status: Former Smoker    Packs/day: 2.00    Years: 35.00    Pack years: 70.00    Types: Cigarettes    Last attempt to quit: 01/29/1990    Years since quitting: 27.4  . Smokeless tobacco: Never Used  Substance and Sexual Activity  . Alcohol use: No  . Drug use: No  . Sexual activity: Yes  Lifestyle  . Physical activity:    Days per week: Not on file    Minutes per session: Not on file  . Stress: Not on file  Relationships  . Social connections:    Talks on phone: Not on file    Gets together: Not on file    Attends religious service: Not on file    Active member of club or organization: Not on file    Attends meetings of clubs or organizations: Not on file    Relationship status: Not on file  Other Topics Concern  . Not on file  Social History Narrative   Lives with wife, dog and cats    Occupation: retired, was self employed, now works at [employer]    Edu: HS   Activity: walks 1.5 mi daily   Diet: some water, fruits/vegetables daily    His  Allergies Are:  Allergies  Allergen Reactions  . Sulfa Antibiotics Hives  :   His Current Medications Are:  Outpatient Encounter Medications as of 07/01/2017  Medication Sig  . albuterol (PROVENTIL HFA;VENTOLIN HFA) 108 (90 Base) MCG/ACT inhaler Inhale 2 puffs into the lungs every 6 (six) hours as needed for wheezing.  Marland Kitchen aspirin 81 MG EC tablet Take 1 tablet (81 mg total) by mouth daily.  Marland Kitchen atenolol (TENORMIN) 25 MG tablet TAKE 1 TABLET BY MOUTH EVERY DAY  . atorvastatin (LIPITOR) 20 MG tablet TAKE 1 TABLET (20 MG TOTAL) BY MOUTH DAILY.  Marland Kitchen diclofenac sodium (VOLTAREN) 1 % GEL APPLY 2 G TOPICALLY 3 (THREE) TIMES DAILY AS NEEDED (ANTI INFLAMMATORY).  Marland Kitchen gabapentin (NEURONTIN) 300 MG capsule Take 1 capsule (300 mg total) by mouth 2 (two) times daily. Take 1 caps in AM and 1 caps at bedtime along with 170m cap (total 4080m  . ibuprofen (ADVIL,MOTRIN) 200 MG tablet Take 400-600 mg by mouth every 6 (six) hours as needed for mild pain (DEPENDS ON PAIN IF TAKES 400-600 MG).   . Marland Kitchenosartan (COZAAR) 100 MG tablet TAKE 1 TABLET EVERY DAY  . metFORMIN (GLUCOPHAGE) 1000 MG tablet TAKE 1 TABLET TWICE DAILY WITH A MEAL  . ONE TOUCH LANCETS MISC Use to check sugar once daily and as needed. Dx: E11.40, E11.65 **One Touch Ultra per insurance  . ONE TOUCH ULTRA TEST test strip USE TO CHECK SUGAR ONCE DAILY AND AS NEEDED  . [DISCONTINUED] benzonatate (TESSALON) 200 MG capsule Take 1 capsule (200 mg total) by mouth 3 (three) times daily as needed.  . [DISCONTINUED] loratadine (CLARITIN) 10 MG tablet Take 10 mg by mouth  daily as needed for itching (hives).  . [DISCONTINUED] amoxicillin-clavulanate (AUGMENTIN) 875-125 MG tablet Take 1 tablet by mouth 2 (two) times daily.   No facility-administered encounter medications on file as of 07/01/2017.   :  Review of Systems:  Out of a complete 14 point review of systems, all are reviewed and negative with the exception of these symptoms as listed below: Review of Systems   Neurological:       Pt presents today to discuss his cpap. Pt had a cold a few weeks back and was unable to to use his cpap.    Objective:  Neurological Exam  Physical Exam Physical Examination:   Vitals:   07/01/17 0910  BP: (!) 152/83  Pulse: (!) 58   General Examination: The patient is a very pleasant 67 y.o. male in no acute distress. He appears well-developed and well-nourished and well groomed. Good spirits.   HEENT: Normocephalic, atraumatic, pupils are equal, round and reactive to light and accommodation. Corrective eye glasses. Extraocular tracking is good without limitation to gaze excursion or nystagmus noted. Normal smooth pursuit is noted. Hearing is mildly impaired. Face is symmetric with normal facial animation and normal facial sensation. Speech is clear with no dysarthria noted. There is no hypophonia. There is no lip, neck/head, jaw or voice tremor. Neck is supple with full range of passive and active motion. Oropharynx exam reveals: mild mouth dryness, adequate dental hygiene with full dentures on top and partials on the bottom, and moderate airway crowding. Tongue protrudes centrally and palate elevates symmetrically. Tonsils are absent.    Chest: Clear to auscultation without wheezing, rhonchi or crackles noted.  Heart: S1+S2+0, regular and normal without murmurs, rubs or gallops noted.   Abdomen: Soft, non-tender and non-distended with normal bowel sounds appreciated on auscultation.  Extremities: There is no pitting edema in the distal lower extremities.    Skin: Warm and dry without trophic changes noted.  Musculoskeletal: exam reveals no obvious joint deformities, tenderness or joint swelling or erythema.   Neurologically:  Mental status: The patient is awake, alert and oriented in all 4 spheres. His immediate and remote memory, attention, language skills and fund of knowledge are appropriate. There is no evidence of aphasia, agnosia, apraxia or  anomia. Speech is clear with normal prosody and enunciation. Thought process is linear. Mood is normal and affect is normal.  Cranial nerves II - XII are as described above under HEENT exam. In addition: shoulder shrug is normal with equal shoulder height noted. Motor exam: Normal bulk, strength and tone is noted. There is no tremor. Fine motor skills and coordination: grossly intact.  Cerebellar testing: No dysmetria or intention tremor. There is no truncal or gait ataxia.  Sensory exam: intact to light touch in the upper and lower extremities.  Gait, station and balance: He stands with some difficulty. No veering to one side is noted. Posture with increase in lumbar kyphosis and tilt to R, both stable. Gait is slow and cautious. Tandem walk is not tested for safety.   Assessment and Plan:  In summary, Wei Poplaski is a very pleasant 67 year old male with an underlying medical history of type 2 diabetes, allergic rhinitis, hypertension, hyperlipidemia, reflux disease, fatty liver, coronary artery disease, arthritis and obesity who presents for follow-up consultation of his obstructive sleep apnea. He had a prior diagnosis of sleep apnea. Mild sleep apnea was confirmed via home sleep test recently on 01/14/2017 which indicated an AHI of 9.5 per hour, average oxygen saturation  of 93% but O2 nadir was 69% with time below 89% saturation of 24 minutes for the night. He has established treatment with AutoPap therapy at home. He is compliant with treatment, had a recent cold and could not use his AutoPap consistently. His leak from the mask is quite high. He is using a nasal dreamwear interface. It bothers the back of his head. He likes to sleep on his back but he has residual soreness at the back of his head during the day. He has not fully benefited from AutoPap therapy but is motivated to continue with treatment. He is commended for his treatment adherence. I suggested that he meet with his DME provider  for a mask refit. In addition, we will proceed with an overnight pulse oximetry test as his home sleep test showed desaturations into the 70s and as low as 69%. We will call him with his pulse oximetry test results, he should be using his AutoPap routinely at the time. If all goes well he can follow-up in 6 months, he can see one of our nurse practitioners. He can be followed yearly after that if stable. I answered all his questions today and the patient was in agreement.  I spent 25 minutes in total face-to-face time with the patient, more than 50% of which was spent in counseling and coordination of care, reviewing test results, reviewing medication and discussing or reviewing the diagnosis of OSA, its prognosis and treatment options. Pertinent laboratory and imaging test results that were available during this visit with the patient were reviewed by me and considered in my medical decision making (see chart for details).

## 2017-07-01 NOTE — Progress Notes (Signed)
ONO order faxed to [facility]. Received a receipt of confirmation.

## 2017-07-03 ENCOUNTER — Ambulatory Visit: Payer: Managed Care, Other (non HMO) | Admitting: Orthotics

## 2017-07-03 DIAGNOSIS — E1142 Type 2 diabetes mellitus with diabetic polyneuropathy: Secondary | ICD-10-CM

## 2017-07-03 DIAGNOSIS — M2011 Hallux valgus (acquired), right foot: Secondary | ICD-10-CM

## 2017-07-03 DIAGNOSIS — M2012 Hallux valgus (acquired), left foot: Secondary | ICD-10-CM

## 2017-07-03 DIAGNOSIS — M2030 Hallux varus (acquired), unspecified foot: Secondary | ICD-10-CM

## 2017-07-03 NOTE — Progress Notes (Signed)
Sanded down bottom of f/o to make 2018 shoes fit better; patient also cast for shoes for [id number].   Chose apex [id number]

## 2017-07-06 ENCOUNTER — Encounter: Payer: Self-pay | Admitting: Neurology

## 2017-07-11 ENCOUNTER — Telehealth: Payer: Self-pay | Admitting: Neurology

## 2017-07-11 DIAGNOSIS — Z9989 Dependence on other enabling machines and devices: Principal | ICD-10-CM

## 2017-07-11 DIAGNOSIS — G4734 Idiopathic sleep related nonobstructive alveolar hypoventilation: Secondary | ICD-10-CM

## 2017-07-11 DIAGNOSIS — G4733 Obstructive sleep apnea (adult) (pediatric): Secondary | ICD-10-CM

## 2017-07-11 NOTE — Telephone Encounter (Signed)
I reviewed his recent overnight pulse oximetry report from 07/05/2017. Total testing time was 7 hours and 44 minutes while on room air and on AutoPap therapy. Average oxygen saturation 93%, lowest oxygen saturation was 81%. He did have nearly 12 minutes below or at 88% saturation for the night. This means that his oxygen saturations are still not optimal while he is using his AutoPap machine. I will request an overnight sleep study with CPAP titration to help optimize his treatment settings and we will submit to his insurance. Please notify the patient that we may request an overnight sleep study during which we will try to optimize his treatment settings and also reliably monitor his oxygen saturations at the time.

## 2017-07-12 NOTE — Telephone Encounter (Signed)
I called pt to discuss his ONO results. No answer, left a message asking him to call me back. 

## 2017-07-12 NOTE — Telephone Encounter (Signed)
Pt's returned RN's call

## 2017-07-12 NOTE — Telephone Encounter (Signed)
I called pt, explained his ONO results. Pt is agreeable to completing an in lab titration study and understands that our sleep lab will check with his insurance again to and let him know the outcome. Pt verbalized understanding of results. Pt had no questions at this time but was encouraged to call back if questions arise.

## 2017-07-18 ENCOUNTER — Encounter: Payer: Self-pay | Admitting: Family Medicine

## 2017-07-24 ENCOUNTER — Other Ambulatory Visit: Payer: Self-pay | Admitting: Family Medicine

## 2017-07-24 MED ORDER — BLOOD GLUCOSE MONITOR SYSTEM W/DEVICE KIT: 1.0000 | PACK | Freq: Every day | 0 refills | Status: DC

## 2017-07-24 MED ORDER — BLOOD GLUCOSE TEST VI STRP: 1.0000 | ORAL_STRIP | Freq: Every day | 0 refills | Status: DC

## 2017-07-24 MED ORDER — LANCETS MISC: 1.0000 | Freq: Every day | 0 refills | Status: DC

## 2017-07-24 NOTE — Telephone Encounter (Signed)
Last filled:  01/18/17, #100 g Last OV:  06/17/17 Next OV:  10/01/17

## 2017-07-24 NOTE — Telephone Encounter (Signed)
According to pharmacy, pt's insurance no longer covers OneTouch.  Requesting general rx for meter, test strips and lancets so they [pharmacy] can fill whichever is covered for pt.  Sent rxs.

## 2017-07-25 ENCOUNTER — Ambulatory Visit: Payer: Managed Care, Other (non HMO) | Admitting: Podiatry

## 2017-08-07 DIAGNOSIS — M1711 Unilateral primary osteoarthritis, right knee: Secondary | ICD-10-CM | POA: Diagnosis not present

## 2017-08-14 ENCOUNTER — Ambulatory Visit (INDEPENDENT_AMBULATORY_CARE_PROVIDER_SITE_OTHER): Payer: Managed Care, Other (non HMO) | Admitting: Neurology

## 2017-08-14 DIAGNOSIS — G4734 Idiopathic sleep related nonobstructive alveolar hypoventilation: Secondary | ICD-10-CM

## 2017-08-14 DIAGNOSIS — G4733 Obstructive sleep apnea (adult) (pediatric): Secondary | ICD-10-CM | POA: Diagnosis not present

## 2017-08-14 DIAGNOSIS — G4720 Circadian rhythm sleep disorder, unspecified type: Secondary | ICD-10-CM

## 2017-08-14 DIAGNOSIS — Z9989 Dependence on other enabling machines and devices: Secondary | ICD-10-CM

## 2017-08-15 NOTE — Procedures (Signed)
S PATIENT'S NAME:  Casey Golden, Casey Golden DOB:      Jun 20, 1950      MR#:    [id number]     DATE OF RECORDING: 08/14/2017 REFERRING M.D.:  Ria Bush MD Study Performed:   CPAP  Titration HISTORY: 67 year old right-handed gentleman with an underlying medical history of type 2 diabetes, allergic rhinitis, hypertension, hyperlipidemia, reflux disease, fatty liver, coronary artery disease, arthritis and obesity who presents for a full night titration study. He has been diagnosed with mild OSA via HST, and has been having difficulty with autoPAP. He had a recent abnormal overnight pulse oximetry test. The patient endorsed the Epworth Sleepiness Scale at 10 points, BMI of 37.4 kg/m2. The patient's neck circumference measured 17.5 inches.  CURRENT MEDICATIONS: Proventil, Aspirin, Tenormin, Lipitor, Voltaren, Neurontin, Advil, Cozaar, Metformin  PROCEDURE:  This is a multichannel digital polysomnogram utilizing the SomnoStar 11.2 system.  Electrodes and sensors were applied and monitored per AASM Specifications.   EEG, EOG, Chin and Limb EMG, were sampled at 200 Hz.  ECG, Snore and Nasal Pressure, Thermal Airflow, Respiratory Effort, CPAP Flow and Pressure, Oximetry was sampled at 50 Hz. Digital video and audio were recorded.      The patient was noted to have mouth venting and oral leak. He declined a chinstrap or full face mask trial, he used his usual N30i nasal mask. CPAP was initiated at 5 cmH20 with heated humidity per AASM standards and pressure was advanced to 7 cmH20 because of hypopneas, apneas and desaturations. At a PAP pressure of 7 cmH20, there was a reduction of the AHI to .4 with supine REM sleep achieved and O2 nadir of 90%.   Lights Out was at 22:55 and Lights On at 05:00. Total recording time (TRT) was 365 minutes, with a total sleep time (TST) of 294.5 minutes. The patient's sleep latency was 12 minutes. REM latency was 58 minutes, which is mildly reduced. The sleep efficiency was 80.7 %.     SLEEP ARCHITECTURE: WASO (Wake after sleep onset) was 58.5 minutes with mild sleep fragmentation noted and one longer period of wakefulness. There were 15 minutes in Stage N1, 97 minutes Stage N2, 127.5 minutes Stage N3 and 55 minutes in Stage REM.  The percentage of Stage N1 was 5.1%, Stage N2 was 32.9%, Stage N3 was 43.3%, which was increased, and Stage R (REM sleep) was 18.7%, near-normal. The arousals were noted as: 28 were spontaneous, 0 were associated with PLMs, 0 were associated with respiratory events.   RESPIRATORY ANALYSIS:  There was a total of 6 respiratory events: 0 obstructive apneas, 0 central apneas and 0 mixed apneas with a total of 0 apneas and an apnea index (AI) of 0 /hour. There were 6 hypopneas with a hypopnea index of 1.2/hour. The patient also had 0 respiratory event related arousals (RERAs).      The total APNEA/HYPOPNEA INDEX  (AHI) was 1.20/hour and the total RESPIRATORY DISTURBANCE INDEX was 1.2 0./hour  5 events occurred in REM sleep and 1 events in NREM. The REM AHI was 5.5 /hour versus a non-REM AHI of .3 0./hour.  The patient spent 294.5 minutes of total sleep time in the supine position and 0 minutes in non-supine. The supine AHI was 1.2, versus a non-supine AHI of 0.0.  OXYGEN SATURATION & C02:  The baseline 02 saturation was 93%, with the lowest being 93% (79% was error). Time spent below 89% saturation equaled 11 minutes.   PERIODIC LIMB MOVEMENTS:  The patient had a total  of 0 Periodic Limb Movements. The Periodic Limb Movement (PLM) index was 0 and the PLM Arousal index was 0 /hour.  Audio and video analysis did not show any abnormal or unusual movements, behaviors, phonations or vocalizations. The patient took no bathroom breaks. The EKG was in keeping with normal sinus rhythm (NSR).  Post-study, the patient indicated that sleep was the same as usual.   DIAGNOSIS:  1. Obstructive Sleep Apnea  2. Dysfunctions associated with sleep stages or arousals from  sleep  PLANS/RECOMMENDATIONS:  1. This study demonstrates resolution of the patient's obstructive sleep apnea with CPAP therapy. I will, therefore, suggest a change from autoPAP to a set pressure of 7 cm. There was notable leak and mouth venting. He will likely benefit from trying a chin strap or trying a full face mask. The patient should be reminded to be fully compliant with PAP therapy to improve sleep related symptoms and decrease long term cardiovascular risks. The patient should be reminded, that it may take up to 3 months to get fully used to using PAP with all planned sleep. The earlier full compliance is achieved, the better long term compliance tends to be. Please note that untreated obstructive sleep apnea may carry additional perioperative morbidity. Patients with significant obstructive sleep apnea should receive perioperative PAP therapy and the surgeons and particularly the anesthesiologist should be informed of the diagnosis and the severity of the sleep disordered breathing. 2. This study shows sleep fragmentation and abnormal sleep stage percentages; these are nonspecific findings and per se do not signify an intrinsic sleep disorder or a cause for the patient's sleep-related symptoms. Causes include (but are not limited to) the first night effect of the sleep study, circadian rhythm disturbances, medication effect or an underlying mood disorder or medical problem.  3. The patient should be cautioned not to drive, work at heights, or operate dangerous or heavy equipment when tired or sleepy. Review and reiteration of good sleep hygiene measures should be pursued with any patient. 4. The patient will be seen in follow-up in the sleep clinic at [facility] for discussion of the test results, symptom and treatment compliance review, further management strategies, etc. The referring provider will be notified of the test results.  I certify that I have reviewed the entire raw data recording prior to  the issuance of this report in accordance with the Standards of Accreditation of the American Academy of Sleep Medicine (AASM)   Star Age, MD, PhD Diplomat, American Board of Neurology and Sleep Medicine (Neurology and Sleep Medicine)

## 2017-08-15 NOTE — Progress Notes (Signed)
Patient had a CPAP titration study on 08/14/17, after being on autoPAP and s/p abn ONO recently.  Please call and inform patient that I have entered an order for treatment with positive airway pressure (PAP) treatment for obstructive sleep apnea (OSA). He did well during the latest sleep study with CPAP. I would suggest changing from autoPAP to a set pressure of 7 cm. The air was leaking from the mouth and he was noted to have mouth breathing. He was using his usual nasal mask, but may need to try a chinstrap or FFM. I will see the patient back in follow-up in about 10 weeks. May see Megan or CM.  Star Age, MD, PhD [facility])

## 2017-08-15 NOTE — Addendum Note (Signed)
Addended by: Star Age on: 08/15/2017 05:17 PM   Modules accepted: Orders

## 2017-08-19 ENCOUNTER — Telehealth: Payer: Self-pay

## 2017-08-19 NOTE — Telephone Encounter (Signed)
-----   Message from Star Age, MD sent at 08/15/2017  5:17 PM EDT ----- Patient had a CPAP titration study on 08/14/17, after being on autoPAP and s/p abn ONO recently.  Please call and inform patient that I have entered an order for treatment with positive airway pressure (PAP) treatment for obstructive sleep apnea (OSA). He did well during the latest sleep study with CPAP. I would suggest changing from autoPAP to a set pressure of 7 cm. The air was leaking from the mouth and he was noted to have mouth breathing. He was using his usual nasal mask, but may need to try a chinstrap or FFM. I will see the patient back in follow-up in about 10 weeks. May see Megan or CM.  Star Age, MD, PhD [facility])

## 2017-08-19 NOTE — Telephone Encounter (Signed)
I called pt. I explained his sleep study results. Pt is agreeable to changing his auto pap pressure to a cpap of 7cm. Pt has tried a chinstrap before but could not tolerate it. He will discuss a new mask with [facility]. Order will be sent to [facility]. Pt has an appt with [name], NP in December, will keep this appt for now and decide if it is still needed pending pt's appt with [name] in October. Pt scheduled an appt with [name] for 11/20/17 at 9:30am. Pt asked me to send him a mychart message with this appt date and time. Pt verbalized understanding. Pt had no questions at this time but was encouraged to call back if questions arise.

## 2017-08-31 ENCOUNTER — Other Ambulatory Visit: Payer: Self-pay | Admitting: Family Medicine

## 2017-09-05 ENCOUNTER — Encounter: Payer: Self-pay | Admitting: Family Medicine

## 2017-09-05 ENCOUNTER — Ambulatory Visit (INDEPENDENT_AMBULATORY_CARE_PROVIDER_SITE_OTHER): Payer: Managed Care, Other (non HMO) | Admitting: Family Medicine

## 2017-09-05 ENCOUNTER — Ambulatory Visit (INDEPENDENT_AMBULATORY_CARE_PROVIDER_SITE_OTHER)
Admission: RE | Admit: 2017-09-05 | Discharge: 2017-09-05 | Disposition: A | Discharge status: Home / Self Care | Payer: Managed Care, Other (non HMO) | Source: Ambulatory Visit | Attending: Family Medicine | Admitting: Family Medicine

## 2017-09-05 VITALS — BP 120/70 | HR 60 | Temp 98.2°F | Ht 68.5 in | Wt 243.5 lb

## 2017-09-05 DIAGNOSIS — R059 Cough, unspecified: Secondary | ICD-10-CM

## 2017-09-05 DIAGNOSIS — R05 Cough: Secondary | ICD-10-CM

## 2017-09-05 DIAGNOSIS — R0789 Other chest pain: Secondary | ICD-10-CM

## 2017-09-05 DIAGNOSIS — I2510 Atherosclerotic heart disease of native coronary artery without angina pectoris: Secondary | ICD-10-CM

## 2017-09-05 DIAGNOSIS — E118 Type 2 diabetes mellitus with unspecified complications: Secondary | ICD-10-CM | POA: Diagnosis not present

## 2017-09-05 DIAGNOSIS — J209 Acute bronchitis, unspecified: Secondary | ICD-10-CM | POA: Diagnosis not present

## 2017-09-05 DIAGNOSIS — R079 Chest pain, unspecified: Secondary | ICD-10-CM | POA: Diagnosis not present

## 2017-09-05 MED ORDER — GUAIFENESIN-CODEINE 100-10 MG/5ML PO SYRP: 5.0000 mL | ORAL_SOLUTION | Freq: Two times a day (BID) | ORAL | 0 refills | Status: DC | PRN

## 2017-09-05 NOTE — Assessment & Plan Note (Signed)
Several week of cough with chest discomfort - anticipate bronchitis. Given duration of symptoms and malaise, check CXR r/o PNA today. Supportive care including codeine cough syrup. Update if not improving with treatment.

## 2017-09-05 NOTE — Patient Instructions (Signed)
I think you may have bronchitis. This is largely viral and you should slowly improve over time.  Take codeine cough syrup - sent to pharmacy. Push fluids and rest over the next several days.  Continue tylenol and may take plain mucinex with plenty of water to help mobilize mucous.  Xray overall looking ok - will await to see what radiologist thinks.

## 2017-09-05 NOTE — Progress Notes (Signed)
BP 120/70 (BP Location: Left Arm, Patient Position: Sitting, Cuff Size: Normal)   Pulse 60   Temp 98.2 F (36.8 C) (Oral)   Ht 5' 8.5" (1.74 m)   Wt 243 lb 8 oz (110.5 kg)   SpO2 96%   BMI 36.49 kg/m    CC: cough Subjective:    Patient ID: Casey Golden, male    DOB: 1950/09/15, 67 y.o.   MRN: [id number]  HPI: Casey Golden is a 67 y.o. male presenting on 09/05/2017 for Cough (Productive cough for wks. Coughs a lot of mucous in the mornings and usually right before bed. C/o chest discomfort from so much coughing. Has been using inhaler. )   2-3 week h/o chest pain/tightness and soreness associated with productive cough. Pain worse with cough. Sweats last night. Out of work for 2 days, not feeling any better. Waking up at night coughing. So far has tried tylenol. Using albuterol inhaler with temporary benefit. Chest congestion and some dyspnea.   No documented fever, head congestion, ST  Known diabetic Lab Results  Component Value Date   HGBA1C 6.5 03/21/2017  No known h/o asthma Remote smoker [name] was sick recently.  Relevant past medical, surgical, family and social history reviewed and updated as indicated. Interim medical history since our last visit reviewed. Allergies and medications reviewed and updated. Outpatient Medications Prior to Visit  Medication Sig Dispense Refill  . albuterol (PROVENTIL HFA;VENTOLIN HFA) 108 (90 Base) MCG/ACT inhaler TAKE 2 PUFFS BY MOUTH EVERY 6 HOURS AS NEEDED FOR WHEEZE 8.5 Inhaler 2  . aspirin 81 MG EC tablet Take 1 tablet (81 mg total) by mouth daily. 30 tablet 12  . atenolol (TENORMIN) 25 MG tablet TAKE 1 TABLET BY MOUTH EVERY DAY 90 tablet 3  . atorvastatin (LIPITOR) 20 MG tablet TAKE 1 TABLET (20 MG TOTAL) BY MOUTH DAILY. 90 tablet 3  . Blood Glucose Monitoring Suppl (BLOOD GLUCOSE MONITOR SYSTEM) w/Device KIT 1 each by Does not apply route daily. Use as directed to check blood sugar once daily 1 each 0  . diclofenac sodium  (VOLTAREN) 1 % GEL APPLY 2 G TOPICALLY 3 (THREE) TIMES DAILY AS NEEDED (ANTI INFLAMMATORY). 100 g 1  . gabapentin (NEURONTIN) 300 MG capsule Take 1 capsule (300 mg total) by mouth 2 (two) times daily. Take 1 caps in AM and 1 caps at bedtime along with 113m cap (total 4082m 180 capsule 3  . Glucose Blood (BLOOD GLUCOSE TEST STRIPS) STRP 1 each by In Vitro route daily. Use as directed to check blood sugar once daily 100 each 0  . ibuprofen (ADVIL,MOTRIN) 200 MG tablet Take 400-600 mg by mouth every 6 (six) hours as needed for mild pain (DEPENDS ON PAIN IF TAKES 400-600 MG).     . Lancets MISC 1 each by Does not apply route daily. Use as directed to check blood sugar daily 100 each 0  . losartan (COZAAR) 100 MG tablet TAKE 1 TABLET EVERY DAY 90 tablet 3  . metFORMIN (GLUCOPHAGE) 1000 MG tablet TAKE 1 TABLET TWICE DAILY WITH A MEAL 180 tablet 1   No facility-administered medications prior to visit.      Per HPI unless specifically indicated in ROS section below Review of Systems     Objective:    BP 120/70 (BP Location: Left Arm, Patient Position: Sitting, Cuff Size: Normal)   Pulse 60   Temp 98.2 F (36.8 C) (Oral)   Ht 5' 8.5" (1.74 m)   Wt 243  lb 8 oz (110.5 kg)   SpO2 96%   BMI 36.49 kg/m   Wt Readings from Last 3 Encounters:  09/05/17 243 lb 8 oz (110.5 kg)  06/17/17 247 lb 8 oz (112.3 kg)  05/20/17 242 lb (109.8 kg)    Physical Exam  Constitutional: He appears well-developed and well-nourished. No distress.  Tired appearing  HENT:  Head: Normocephalic and atraumatic.  Right Ear: Hearing, tympanic membrane, external ear and ear canal normal.  Left Ear: Hearing, tympanic membrane, external ear and ear canal normal.  Nose: Nose normal. No mucosal edema or rhinorrhea. Right sinus exhibits no maxillary sinus tenderness and no frontal sinus tenderness. Left sinus exhibits no maxillary sinus tenderness and no frontal sinus tenderness.  Mouth/Throat: Uvula is midline, oropharynx is  clear and moist and mucous membranes are normal. No oropharyngeal exudate, posterior oropharyngeal edema, posterior oropharyngeal erythema or tonsillar abscesses.  Eyes: Pupils are equal, round, and reactive to light. Conjunctivae and EOM are normal. No scleral icterus.  Neck: Normal range of motion. Neck supple.  Cardiovascular: Normal rate, regular rhythm, normal heart sounds and intact distal pulses.  No murmur heard. Pulmonary/Chest: Effort normal and breath sounds normal. No respiratory distress. He has no wheezes. He has no rales. He exhibits no tenderness.  Lungs overall clear No reproducible chest wall discomfort  Lymphadenopathy:    He has no cervical adenopathy.  Skin: Skin is warm and dry. No rash noted.  Nursing note and vitals reviewed.  Results for orders placed or performed in visit on 07/18/17  HM DIABETES FOOT EXAM  Result Value Ref Range   HM Diabetic Foot Exam done       Assessment & Plan:   Problem List Items Addressed This Visit    Controlled diabetes mellitus type 2 with complications ([place])   Acute bronchitis - Primary    Several week of cough with chest discomfort - anticipate bronchitis. Given duration of symptoms and malaise, check CXR r/o PNA today. Supportive care including codeine cough syrup. Update if not improving with treatment.        Other Visit Diagnoses    Chest discomfort           Meds ordered this encounter  Medications  . guaiFENesin-codeine (CHERATUSSIN AC) 100-10 MG/5ML syrup    Sig: Take 5 mLs by mouth 2 (two) times daily as needed for cough (sedation precautions).    Dispense:  120 mL    Refill:  0   Orders Placed This Encounter  Procedures  . DG Chest 2 View    Standing Status:   Future    Number of Occurrences:   1    Standing Expiration Date:   11/06/2018    Order Specific Question:   Reason for Exam (SYMPTOM  OR DIAGNOSIS REQUIRED)    Answer:   2+ wk h/o cough with chest pain    Order Specific Question:   Preferred imaging  location?    Answer:   [facility]    Order Specific Question:   Radiology Contrast Protocol - do NOT remove file path    Answer:   [email or web address]    Follow up plan: No follow-ups on file.  Ria Bush, MD

## 2017-09-21 ENCOUNTER — Other Ambulatory Visit: Payer: Self-pay | Admitting: Cardiovascular Disease

## 2017-10-01 ENCOUNTER — Encounter: Payer: Self-pay | Admitting: Family Medicine

## 2017-10-01 ENCOUNTER — Ambulatory Visit (INDEPENDENT_AMBULATORY_CARE_PROVIDER_SITE_OTHER): Payer: Managed Care, Other (non HMO) | Admitting: Family Medicine

## 2017-10-01 VITALS — BP 130/70 | HR 59 | Temp 97.9°F | Ht 68.5 in | Wt 240.5 lb

## 2017-10-01 DIAGNOSIS — G4733 Obstructive sleep apnea (adult) (pediatric): Secondary | ICD-10-CM | POA: Diagnosis not present

## 2017-10-01 DIAGNOSIS — R972 Elevated prostate specific antigen [PSA]: Secondary | ICD-10-CM | POA: Diagnosis not present

## 2017-10-01 DIAGNOSIS — E118 Type 2 diabetes mellitus with unspecified complications: Secondary | ICD-10-CM | POA: Diagnosis not present

## 2017-10-01 DIAGNOSIS — I2510 Atherosclerotic heart disease of native coronary artery without angina pectoris: Secondary | ICD-10-CM

## 2017-10-01 LAB — POCT GLYCOSYLATED HEMOGLOBIN (HGB A1C): Hemoglobin A1C: 6 % — AB (ref 4.0–5.6)

## 2017-10-01 NOTE — Assessment & Plan Note (Addendum)
Chronic, great control based on recall cbg's and recent A1c. Interested in DM classes -will refer. Continue metformin 1000mg  bid.

## 2017-10-01 NOTE — Assessment & Plan Note (Signed)
Compliant with CPAP nightly - "doing a whole lot better"

## 2017-10-01 NOTE — Progress Notes (Signed)
BP 130/70 (BP Location: Left Arm, Patient Position: Sitting, Cuff Size: Large)   Pulse (!) 59   Temp 97.9 F (36.6 C) (Oral)   Ht 5' 8.5" (1.74 m)   Wt 240 lb 8 oz (109.1 kg)   SpO2 95%   BMI 36.04 kg/m    CC: 6 mo f/u visit Subjective:    Patient ID: Casey Golden, male    DOB: 05-26-1950, 67 y.o.   MRN: [id number]  HPI: Khyrin Trevathan is a 67 y.o. male presenting on 10/01/2017 for 6 mo follow up   May go part time [employer] in the new year.  CPAP started earlier this year.  DM - does regularly check sugars about once a week - running well controlled. Compliant with antihyperglycemic regimen which includes: metformin 1036m bid. Denies low sugars or hypoglycemic symptoms. Denies paresthesias. Last diabetic eye exam 11/2016. Pneumovax: 2018. Prevnar: 2017. Glucometer brand: doesn't remember brand. DSME: has not completed - agrees to referral today.  Lab Results  Component Value Date   HGBA1C 6.0 (A) 10/01/2017   Diabetic Foot Exam - Simple   Simple Foot Form Diabetic Foot exam was performed with the following findings:  Yes 10/01/2017  9:48 AM  Visual Inspection No deformities, no ulcerations, no other skin breakdown bilaterally:  Yes Sensation Testing See comments:  Yes Pulse Check Posterior Tibialis and Dorsalis pulse intact bilaterally:  Yes Comments Diminished to monofilament testing bilateral soles 1+ DP bilaterally    Lab Results  Component Value Date   HGBA1C 6.0 (A) 10/01/2017   HGBA1C 6.5 03/21/2017   HGBA1C 6.6 (H) 10/25/2016   Lab Results  Component Value Date   MICROALBUR <0.7 10/25/2016   LDLCALC 43 03/21/2017   CREATININE 0.89 03/21/2017    Relevant past medical, surgical, family and social history reviewed and updated as indicated. Interim medical history since our last visit reviewed. Allergies and medications reviewed and updated. Outpatient Medications Prior to Visit  Medication Sig Dispense Refill  . albuterol (PROVENTIL HFA;VENTOLIN  HFA) 108 (90 Base) MCG/ACT inhaler TAKE 2 PUFFS BY MOUTH EVERY 6 HOURS AS NEEDED FOR WHEEZE 8.5 Inhaler 2  . aspirin 81 MG EC tablet Take 1 tablet (81 mg total) by mouth daily. 30 tablet 12  . atenolol (TENORMIN) 25 MG tablet TAKE 1 TABLET BY MOUTH EVERY DAY 90 tablet 3  . atorvastatin (LIPITOR) 20 MG tablet TAKE 1 TABLET (20 MG TOTAL) BY MOUTH DAILY. 90 tablet 3  . Blood Glucose Monitoring Suppl (BLOOD GLUCOSE MONITOR SYSTEM) w/Device KIT 1 each by Does not apply route daily. Use as directed to check blood sugar once daily 1 each 0  . diclofenac sodium (VOLTAREN) 1 % GEL APPLY 2 G TOPICALLY 3 (THREE) TIMES DAILY AS NEEDED (ANTI INFLAMMATORY). 100 g 1  . gabapentin (NEURONTIN) 300 MG capsule Take 1 capsule (300 mg total) by mouth 2 (two) times daily. Take 1 caps in AM and 1 caps at bedtime along with 1083mcap (total 40060m180 capsule 3  . Glucose Blood (BLOOD GLUCOSE TEST STRIPS) STRP 1 each by In Vitro route daily. Use as directed to check blood sugar once daily 100 each 0  . guaiFENesin-codeine (CHERATUSSIN AC) 100-10 MG/5ML syrup Take 5 mLs by mouth 2 (two) times daily as needed for cough (sedation precautions). 120 mL 0  . ibuprofen (ADVIL,MOTRIN) 200 MG tablet Take 400-600 mg by mouth every 6 (six) hours as needed for mild pain (DEPENDS ON PAIN IF TAKES 400-600 MG).     .Marland Kitchen  Lancets MISC 1 each by Does not apply route daily. Use as directed to check blood sugar daily 100 each 0  . losartan (COZAAR) 100 MG tablet TAKE 1 TABLET EVERY DAY 90 tablet 3  . metFORMIN (GLUCOPHAGE) 1000 MG tablet TAKE 1 TABLET TWICE DAILY WITH A MEAL 180 tablet 1   No facility-administered medications prior to visit.      Per HPI unless specifically indicated in ROS section below Review of Systems     Objective:    BP 130/70 (BP Location: Left Arm, Patient Position: Sitting, Cuff Size: Large)   Pulse (!) 59   Temp 97.9 F (36.6 C) (Oral)   Ht 5' 8.5" (1.74 m)   Wt 240 lb 8 oz (109.1 kg)   SpO2 95%   BMI  36.04 kg/m   Wt Readings from Last 3 Encounters:  10/01/17 240 lb 8 oz (109.1 kg)  09/05/17 243 lb 8 oz (110.5 kg)  06/17/17 247 lb 8 oz (112.3 kg)    Physical Exam  Constitutional: He appears well-developed and well-nourished. No distress.  HENT:  Head: Normocephalic and atraumatic.  Right Ear: External ear normal.  Left Ear: External ear normal.  Nose: Nose normal.  Mouth/Throat: Oropharynx is clear and moist. No oropharyngeal exudate.  Eyes: Pupils are equal, round, and reactive to light. Conjunctivae and EOM are normal. No scleral icterus.  Neck: Normal range of motion. Neck supple.  Cardiovascular: Normal rate, regular rhythm, normal heart sounds and intact distal pulses.  No murmur heard. Pulmonary/Chest: Effort normal and breath sounds normal. No respiratory distress. He has no wheezes. He has no rales.  Musculoskeletal: He exhibits no edema.  See HPI for foot exam if done  Lymphadenopathy:    He has no cervical adenopathy.  Skin: Skin is warm and dry. No rash noted.  Psychiatric: He has a normal mood and affect.  Nursing note and vitals reviewed.  Results for orders placed or performed in visit on 10/01/17  POCT glycosylated hemoglobin (Hb A1C)  Result Value Ref Range   Hemoglobin A1C 6.0 (A) 4.0 - 5.6 %   HbA1c POC (<> result, manual entry)     HbA1c, POC (prediabetic range)     HbA1c, POC (controlled diabetic range)        Assessment & Plan:   Problem List Items Addressed This Visit    OSA (obstructive sleep apnea)    Compliant with CPAP nightly - "doing a whole lot better"      Elevated PSA    Pt states he had benign prostate biopsy 03/2017 - I don't have records of this.       Controlled diabetes mellitus type 2 with complications ([facility]) - Primary    Chronic, great control based on recall cbg's and recent A1c. Interested in DM classes -will refer. Continue metformin 1043m bid.      Relevant Orders   POCT glycosylated hemoglobin (Hb A1C) (Completed)    Ambulatory referral to diabetic education       No orders of the defined types were placed in this encounter.  Orders Placed This Encounter  Procedures  . Ambulatory referral to diabetic education    Referral Priority:   Routine    Referral Type:   Consultation    Referral Reason:   Specialty Services Required    Number of Visits Requested:   1  . POCT glycosylated hemoglobin (Hb A1C)    Follow up plan: Return in about 6 months (around 04/01/2018) for annual exam, prior  fasting for blood work, medicare wellness visit.  Ria Bush, MD

## 2017-10-01 NOTE — Patient Instructions (Addendum)
We will refer you to diabetes classes.  You are doing well today Continue current medicines Return as needed or in 6 months for physical.

## 2017-10-01 NOTE — Assessment & Plan Note (Signed)
Pt states he had benign prostate biopsy 03/2017 - I don't have records of this.

## 2017-10-16 ENCOUNTER — Ambulatory Visit: Payer: Managed Care, Other (non HMO) | Admitting: Orthotics

## 2017-10-16 ENCOUNTER — Other Ambulatory Visit: Payer: Managed Care, Other (non HMO) | Admitting: Orthotics

## 2017-10-16 ENCOUNTER — Other Ambulatory Visit: Payer: Self-pay | Admitting: Family Medicine

## 2017-10-16 DIAGNOSIS — E119 Type 2 diabetes mellitus without complications: Secondary | ICD-10-CM

## 2017-10-16 DIAGNOSIS — M2011 Hallux valgus (acquired), right foot: Secondary | ICD-10-CM

## 2017-10-16 NOTE — Progress Notes (Signed)
Need to reorder [id number]

## 2017-10-23 ENCOUNTER — Other Ambulatory Visit: Payer: Managed Care, Other (non HMO) | Admitting: Orthotics

## 2017-10-28 ENCOUNTER — Ambulatory Visit (INDEPENDENT_AMBULATORY_CARE_PROVIDER_SITE_OTHER): Payer: Managed Care, Other (non HMO) | Admitting: Podiatry

## 2017-10-28 ENCOUNTER — Encounter: Payer: Self-pay | Admitting: Podiatry

## 2017-10-28 DIAGNOSIS — E1142 Type 2 diabetes mellitus with diabetic polyneuropathy: Secondary | ICD-10-CM | POA: Diagnosis not present

## 2017-10-28 DIAGNOSIS — M79676 Pain in unspecified toe(s): Secondary | ICD-10-CM | POA: Diagnosis not present

## 2017-10-28 DIAGNOSIS — M2030 Hallux varus (acquired), unspecified foot: Secondary | ICD-10-CM

## 2017-10-28 DIAGNOSIS — B351 Tinea unguium: Secondary | ICD-10-CM

## 2017-10-28 NOTE — Progress Notes (Signed)
Complaint:  Visit Type: Patient returns to my office for continued preventative foot care services. Complaint: Patient states" my nails have grown long and thick and become painful to walk and wear shoes" Patient has been diagnosed with DM with neuropathy. The patient presents for preventative foot care services. No changes to ROS  Podiatric Exam: Vascular: dorsalis pedis and posterior tibial pulses are palpable bilateral. Capillary return is immediate. Temperature gradient is WNL. Skin turgor WNL  Sensorium: Absent  Semmes Weinstein monofilament test. Diminished  tactile sensation bilaterally. Nail Exam: Pt has thick disfigured discolored nails with subungual debris noted bilateral entire nail hallux through fifth toenails Ulcer Exam: There is no evidence of ulcer or pre-ulcerative changes or infection. Orthopedic Exam: Muscle tone and strength are WNL. No limitations in general ROM. No crepitus or effusions noted. HAV with hallux malleus. Tailors Bunion  B/L Skin: No Porokeratosis. No infection or ulcers  Diagnosis:  Onychomycosis, , Pain in right toe, pain in left toes,  DPN  HAV with hallux malleus.  Tailors bunion  B/L  Treatment & Plan Procedures and Treatment: Consent by patient was obtained for treatment procedures. The patient understood the discussion of treatment and procedures well. All questions were answered thoroughly reviewed. Debridement of mycotic and hypertrophic toenails, 1 through 5 bilateral and clearing of subungual debris. No ulceration, no infection noted. Return Visit-Office Procedure: Patient instructed to return to the office for a follow up visit 3 months for continued evaluation and treatment.    Ahlaya Ende DPM 

## 2017-10-30 ENCOUNTER — Ambulatory Visit: Payer: Managed Care, Other (non HMO) | Admitting: Orthotics

## 2017-10-30 DIAGNOSIS — B351 Tinea unguium: Secondary | ICD-10-CM

## 2017-10-30 DIAGNOSIS — E1142 Type 2 diabetes mellitus with diabetic polyneuropathy: Secondary | ICD-10-CM

## 2017-10-30 DIAGNOSIS — M2030 Hallux varus (acquired), unspecified foot: Secondary | ICD-10-CM

## 2017-10-30 DIAGNOSIS — M79676 Pain in unspecified toe(s): Principal | ICD-10-CM

## 2017-10-30 NOTE — Progress Notes (Signed)
Reorder shoes y900 11.5 xw

## 2017-11-01 ENCOUNTER — Encounter: Payer: Managed Care, Other (non HMO) | Attending: Family Medicine | Admitting: *Deleted

## 2017-11-01 ENCOUNTER — Encounter: Payer: Self-pay | Admitting: *Deleted

## 2017-11-01 VITALS — BP 120/70 | Ht 69.0 in | Wt 243.0 lb

## 2017-11-01 DIAGNOSIS — Z6835 Body mass index (BMI) 35.0-35.9, adult: Secondary | ICD-10-CM | POA: Insufficient documentation

## 2017-11-01 DIAGNOSIS — Z713 Dietary counseling and surveillance: Secondary | ICD-10-CM | POA: Diagnosis not present

## 2017-11-01 DIAGNOSIS — E119 Type 2 diabetes mellitus without complications: Secondary | ICD-10-CM

## 2017-11-01 DIAGNOSIS — E118 Type 2 diabetes mellitus with unspecified complications: Secondary | ICD-10-CM | POA: Insufficient documentation

## 2017-11-01 NOTE — Progress Notes (Signed)
Diabetes Self-Management Education  Visit Type: First/Initial  Appt. Start Time: 1030 Appt. End Time: 1130  11/01/2017  [name], identified by name and date of birth, is a 67 y.o. male with a diagnosis of Diabetes: Type 2.   ASSESSMENT  Blood pressure 120/70, height 5\' 9"  (1.753 m), weight 243 lb (110.2 kg). Body mass index is 35.88 kg/m.  Diabetes Self-Management Education - 11/01/17 1152      Visit Information   Visit Type  First/Initial      Initial Visit   Diabetes Type  Type 2    Are you currently following a meal plan?  No    Are you taking your medications as prescribed?  Yes    Date Diagnosed  7-8 years ago      Health Coping   How would you rate your overall health?  Good      Psychosocial Assessment   Patient Belief/Attitude about Diabetes  Other (comment)   "it is not good - it is what it is"   Self-care barriers  None    Self-management support  Doctor's office;Family    Patient Concerns  Nutrition/Meal planning;Glycemic Control;Medication;Monitoring;Healthy Lifestyle;Weight Control    Special Needs  None    Preferred Learning Style  Auditory;Visual    Learning Readiness  Change in progress    How often do you need to have someone help you when you read instructions, pamphlets, or other written materials from your doctor or pharmacy?  1 - Never    What is the last grade level you completed in school?  high school      Pre-Education Assessment   Patient understands the diabetes disease and treatment process.  Needs Review    Patient understands incorporating nutritional management into lifestyle.  Needs Instruction    Patient undertands incorporating physical activity into lifestyle.  Needs Review    Patient understands using medications safely.  Needs Review    Patient understands monitoring blood glucose, interpreting and using results  Needs Review    Patient understands prevention, detection, and treatment of acute complications.  Needs  Instruction    Patient understands prevention, detection, and treatment of chronic complications.  Needs Review    Patient understands how to develop strategies to address psychosocial issues.  Needs Instruction    Patient understands how to develop strategies to promote health/change behavior.  Needs Instruction      Complications   Last HgB A1C per patient/outside source  6 %   10/01/17   How often do you check your blood sugar?  3-4 times / week    Fasting Blood glucose range (mg/dL)  70-129   Pt reports FBG's less than 130's mg/dL.    Postprandial Blood glucose range (mg/dL)  130-179   He reports occasionally checks pm with readings less than 160's mg/dL.    Have you had a dilated eye exam in the past 12 months?  Yes    Have you had a dental exam in the past 12 months?  Yes    Are you checking your feet?  No      Dietary Intake   Breakfast  9 am - grapes, 1/2 banana; cereal and milk; cookies    Lunch  4-6 pm veggie burger, chinese soup and crackers; trail mix with fruit and nuts; chips    Snack (afternoon)  cookies    Dinner  9 pm - veggie burger, chicken, beef, pork, fish with potatoes, peas, beans, corn, rice, lettuce, tomatoes, cuccumbers, green  beans, pinto beans, peppers    Beverage(s)  water, okra water, black coffee and occasional diet soda      Exercise   Exercise Type  Light (walking / raking leaves)    How many days per week to you exercise?  7    How many minutes per day do you exercise?  45    Total minutes per week of exercise  315      Patient Education   Previous Diabetes Education  No    Disease state   Definition of diabetes, type 1 and 2, and the diagnosis of diabetes    Nutrition management   Role of diet in the treatment of diabetes and the relationship between the three main macronutrients and blood glucose level;Food label reading, portion sizes and measuring food.;Reviewed blood glucose goals for pre and post meals and how to evaluate the patients' food  intake on their blood glucose level.    Physical activity and exercise   Role of exercise on diabetes management, blood pressure control and cardiac health.    Medications  Reviewed patients medication for diabetes, action, purpose, timing of dose and side effects.    Monitoring  Purpose and frequency of SMBG.;Taught/discussed recording of test results and interpretation of SMBG.;Identified appropriate SMBG and/or A1C goals.    Chronic complications  Relationship between chronic complications and blood glucose control    Psychosocial adjustment  Identified and addressed patients feelings and concerns about diabetes      Individualized Goals (developed by patient)   Reducing Risk  Improve blood sugars Decrease medications Prevent diabetes complications Lose weight Lead a healthier lifestyle     Outcomes   Expected Outcomes  Demonstrated interest in learning. Expect positive outcomes    Future DMSE  2 wks       Individualized Plan for Diabetes Self-Management Training:   Learning Objective:  Patient will have a greater understanding of diabetes self-management. Patient education plan is to attend individual and/or group sessions per assessed needs and concerns.   Plan:   Patient Instructions  Check blood sugars 1 x day before breakfast or 2 hrs after one meal 3-4 x week Bring blood sugar records to the next appointment Exercise: Continue walking for  30-60  minutes  7 days a week Eat 3 meals day,   1-2  snacks a day Space meals 4-6 hours apart Include 1 serving of protein with meals Limit desserts/sweets Complete 3 Day Food Record and bring to next appt Return for appointment/classes on:  Tuesday November 19, 2017 at 10:00 am with Jaclyn Shaggy (dietitian)  Expected Outcomes:  Demonstrated interest in learning. Expect positive outcomes  Education material provided:  General Meal Planning Guidelines Simple Meal Plan 3 Day Food Record  If problems or questions, patient to contact  team via:  Johny Drilling, RN, CCM, CDE [phone]  Future DSME appointment: 2 wks  He is unable to attend Diabetes Classes due to his work and family schedules. He will attend the 2 Hour Refresher Program. His next appointment is schedule for November 19, 2017 with the dietitian.

## 2017-11-01 NOTE — Patient Instructions (Signed)
Check blood sugars 1 x day before breakfast or 2 hrs after one meal 3-4 x week Bring blood sugar records to the next appointment  Exercise: Continue walking for  30-60  minutes  7 days a week  Eat 3 meals day,   1-2  snacks a day Space meals 4-6 hours apart Include 1 serving of protein with meals Limit desserts/sweets Complete 3 Day Food Record and bring to next appt  Return for appointment/classes on:  Tuesday November 19, 2017 at 10:00 am with Jaclyn Shaggy (dietitian)

## 2017-11-02 LAB — HM DIABETES EYE EXAM

## 2017-11-05 ENCOUNTER — Encounter: Payer: Self-pay | Admitting: Family Medicine

## 2017-11-08 DIAGNOSIS — Z01818 Encounter for other preprocedural examination: Secondary | ICD-10-CM | POA: Diagnosis not present

## 2017-11-08 DIAGNOSIS — H2511 Age-related nuclear cataract, right eye: Secondary | ICD-10-CM | POA: Diagnosis not present

## 2017-11-08 DIAGNOSIS — H52221 Regular astigmatism, right eye: Secondary | ICD-10-CM | POA: Diagnosis not present

## 2017-11-13 ENCOUNTER — Ambulatory Visit: Payer: Managed Care, Other (non HMO) | Admitting: Orthotics

## 2017-11-13 DIAGNOSIS — B351 Tinea unguium: Secondary | ICD-10-CM

## 2017-11-13 DIAGNOSIS — M2030 Hallux varus (acquired), unspecified foot: Secondary | ICD-10-CM

## 2017-11-13 DIAGNOSIS — M79676 Pain in unspecified toe(s): Principal | ICD-10-CM

## 2017-11-13 NOTE — Progress Notes (Signed)
Reordered shoes [id number]

## 2017-11-19 ENCOUNTER — Ambulatory Visit: Payer: Managed Care, Other (non HMO) | Admitting: Dietician

## 2017-11-20 ENCOUNTER — Ambulatory Visit: Payer: Managed Care, Other (non HMO) | Admitting: Neurology

## 2017-11-21 ENCOUNTER — Encounter: Payer: Self-pay | Admitting: *Deleted

## 2017-11-21 DIAGNOSIS — H25811 Combined forms of age-related cataract, right eye: Secondary | ICD-10-CM | POA: Diagnosis not present

## 2017-11-21 DIAGNOSIS — H2512 Age-related nuclear cataract, left eye: Secondary | ICD-10-CM | POA: Diagnosis not present

## 2017-11-21 DIAGNOSIS — H2511 Age-related nuclear cataract, right eye: Secondary | ICD-10-CM | POA: Diagnosis not present

## 2017-11-29 HISTORY — PX: CATARACT EXTRACTION, BILATERAL: SHX1313

## 2017-12-04 ENCOUNTER — Other Ambulatory Visit: Payer: Self-pay | Admitting: Family Medicine

## 2017-12-04 DIAGNOSIS — H2512 Age-related nuclear cataract, left eye: Secondary | ICD-10-CM | POA: Diagnosis not present

## 2017-12-04 DIAGNOSIS — H25812 Combined forms of age-related cataract, left eye: Secondary | ICD-10-CM | POA: Diagnosis not present

## 2017-12-12 ENCOUNTER — Other Ambulatory Visit: Payer: Self-pay | Admitting: Family Medicine

## 2017-12-17 ENCOUNTER — Encounter: Payer: Self-pay | Admitting: Neurology

## 2017-12-19 ENCOUNTER — Ambulatory Visit (INDEPENDENT_AMBULATORY_CARE_PROVIDER_SITE_OTHER): Payer: Managed Care, Other (non HMO) | Admitting: Neurology

## 2017-12-19 ENCOUNTER — Telehealth: Payer: Self-pay | Admitting: Family Medicine

## 2017-12-19 ENCOUNTER — Encounter: Payer: Self-pay | Admitting: Neurology

## 2017-12-19 VITALS — BP 162/80 | HR 74 | Ht 68.5 in | Wt 246.0 lb

## 2017-12-19 DIAGNOSIS — Z9989 Dependence on other enabling machines and devices: Secondary | ICD-10-CM

## 2017-12-19 DIAGNOSIS — G4733 Obstructive sleep apnea (adult) (pediatric): Secondary | ICD-10-CM

## 2017-12-19 DIAGNOSIS — R509 Fever, unspecified: Secondary | ICD-10-CM | POA: Diagnosis not present

## 2017-12-19 DIAGNOSIS — R5381 Other malaise: Secondary | ICD-10-CM

## 2017-12-19 DIAGNOSIS — I2510 Atherosclerotic heart disease of native coronary artery without angina pectoris: Secondary | ICD-10-CM

## 2017-12-19 NOTE — Telephone Encounter (Signed)
°  FYI  Best number [phone]  Pt walked in today wanting an appointment. He stated he had fever since  Sunday.  He did have the shingles vaccine on Sunday.  His bp on Sunday the bottom number was 98 didn't know the top number. He has no chest pains or shortness of breath  He saw dr Star Age today his bp was 162/80.  I offered him an appointment with dr lauren guse @ [place] station today pt refused.  I offered him an appointment with you tomorrow pt stated he was going to go home and and call tele a doc this is free through his insurance.  If they tell him he needs appointment he will call back to schedule

## 2017-12-19 NOTE — Telephone Encounter (Signed)
Noted. Thanks. I'm out of office this afternoon.

## 2017-12-19 NOTE — Progress Notes (Signed)
Subjective:    Patient ID: Casey Golden is a 67 y.o. male.  HPI     Interim history:   Casey Golden is a 67 year old right-handed gentleman with an underlying medical history of type 2 diabetes, allergic rhinitis, hypertension, hyperlipidemia, reflux disease, fatty liver, coronary artery disease, arthritis and obesity who presents for follow-up consultation of his obstructive sleep apnea after her a recent CPAP titration study. The patient is unaccompanied today. I last saw him on 07/01/2017, at which time he was compliant with his AutoPap. However, he was still struggling with tolerance, he was recently treated for an upper respiratory infection.   We did an overnight pulse oximetry test in the interim on 07/05/2017 which showed ongoing issues with desaturations at night with an O2 nadir of 81% and time below 89% saturation of nearly 12 minutes for the night. He was advised to return for a full night CPAP titration study. He had this on 08/14/2017. Sleep efficiency was 80.7%, sleep latency 12 minutes, REM latency 58 minutes. He was fitted with a nasal mask but was noted to have mouth venting. He declined a trial of full face mask. CPAP was started at 5 cm and advanced a 7 cm. On the final pressure his AHI was 0.4 per hour with supine REM sleep achieved an O2 nadir of 90%. Based on his test results I prescribed CPAP therapy for home use at a pressure of 7 cm.   Today, 12/19/2017: I reviewed his CPAP compliance data from 11/18/2017 through 12/17/2017 which is a total of 30 days, during which time he used his CPAP 29 days with percent used days greater than 4 hours at 87%, indicating very good compliance with an average usage of 6 hours and 11 minutes, residual AHI borderline at 4.5 per hour, leak on the high side with the 95th percentile at 24.6 L/m on a pressure of 7 cm with EPR of 3. He reports generally having done well with his CPAP, he wishes not to use a chinstrap but generally does okay with the  nasal mask. In the past 5 days or so he has had intermittent chills and fever, has not checked his fever but felt feverish and has had some myalgias and general malaise. He denies any drainage or productive cough, no wheezing, does not feel like a cold. He took the second shingles shot about 5 days ago when in the afternoon and evening is when his symptoms started. He has not contacted his primary care yet. He has taken over-the-counter medication for fever control and symptom control. He has not slept as well. He took the flu shot and August 2019.    The patient's allergies, current medications, family history, past medical history, past social history, past surgical history and problem list were reviewed and updated as appropriate.    Previously:  I first met him on 12/04/2016 at the request of his primary care physician, at which time the patient reported a prior diagnosis of sleep apnea, he had a diagnostic test in July 2015 which indicated mild sleep apnea he had a CPAP titration study in August 2015 which determined an adequate treatment pressure of 8 cm. He had not start CPAP therapy at the time. He had a home sleep test on 01/14/2017 which indicated mild sleep apnea with an AHI of 9.5 per hour, however desaturation nadir was 69% which was significant, and time below or at 88% saturation was 24 minutes for the test time of 7 hours and 45  minutes. He was advised to start AutoPap therapy.   I reviewed his AutoPap compliance data from 05/28/2017 through 06/26/2017 which is a total of 30 days, during which time he used his machine every night with percent used days greater than 4 hours at 87%, indicating very good compliance however average usage of only 5 hours and 6 minutes, residual AHI borderline at 5.5 per hour, 95th percentile pressure at 9.2 cm, leak quite high with the 95th percentile at 46.7 L/m at a pressure range of 6 cm to 13 cm with EPR.   12/04/2016: (He) was previously diagnosed with  obstructive sleep apnea. I reviewed his prior study results from 2015. He had a baseline sleep study on 07/29/2013 which showed an AHI of 11.3 per hour, 37.9 per hour during REM sleep. REM latency was 50 minutes, sleep efficiency was 80.3%. Average oxygen saturation was 96.8%, nadir was 72.6%. He had a PLM index of 51.2 with an associated arousal index of 0.5 per hour. He had a CPAP titration study on 09/16/2013. He was titrated from 5 cm to 10 cm. His optimal pressure was deemed 8 cm. His BMI at that time was 32. I reviewed your office note from 11/22/2016. He has not actually been on CPAP therapy. I reviewed your office note from 10/25/2016 as well. His Epworth sleepiness score is 10 out of 24 on the fatigue score is 15 out of 63. He lives at home with his wife. They have 3 children. He works at [employer] He quit smoking in 1990 and does not typically drink alcohol, drinks caffeine in the form of coffee, 5 cups a day on average. His bedtime is typically around 11. By that time he has typically already taken a short nap while watching TV. His wake up time is around 5, he helps out with his grandchildren, taking them to school. He denies restless leg symptoms. He has neuropathy secondary to diabetes and is on gabapentin. He has a history of pneumonia twice or 3 times in his life, also bronchitis. He denies morning headaches but has nocturia about once or twice per average night.  His Past Medical History Is Significant For: Past Medical History:  Diagnosis Date  . Arthritis   . CAD (coronary artery disease)    Cardiologist--  Dr. Ida Rogue  . Colonic polyp   . Fatty liver disease, nonalcoholic 0355   by [place]  . GERD (gastroesophageal reflux disease)   . History of chicken pox   . Hyperlipidemia   . Hypertension   . Nocturia   . OSA (obstructive sleep apnea)    per pt no cpap due to sleep center/ insurance issue-- study done 2014  . Rash of genital area    09-08-2014  per pt Dr Junious Silk aware   . Seasonal and perennial allergic rhinitis   . Type 2 diabetes mellitus ([place])   . Wears dentures    full upper/  partial lower  . Wears glasses     His Past Surgical History Is Significant For: Past Surgical History:  Procedure Laterality Date  . ABDOMINAL HERNIA REPAIR  2007      [facility]   open repair  . APPENDECTOMY  age 51  . CARDIAC CATHETERIZATION  12-23-2007   [facility]   Abnormal myoview w/ ischemia/  40% mRCA with nonobstructive and no sig. plaque in his left system, EF 55%  . CARDIAC CATHETERIZATION  Apr 2008    [facility]   Abnormal myoview/  50% RCA,  ef  65%  . CARDIAC CATHETERIZATION  1999      [facility]  . CERVICAL FUSION  1992  . CHOLECYSTECTOMY OPEN  2006  . COLONOSCOPY WITH PROPOFOL N/A 04/17/2016   TAs, high grade dysplasia with margins clear, diverticulosis Lucilla Lame, MD)  . COLONOSCOPY WITH PROPOFOL N/A 08/07/2016   TAx1, diverticulosis, rpt 3 yrs ([place])  . DENTAL SURGERY     metal dental implant L mandible  . EXCISION OF SKIN TAG Right 09/14/2014   Procedure: EXCISION OF SKIN TAG;  Surgeon: Festus Aloe, MD;  Location: [facility];  Service: Urology;  Laterality: Right;  . HYDROCELE EXCISION Left 09/14/2014   Procedure: LEFT HYDROCELECTOMY ADULT;  Surgeon: Festus Aloe, MD;  Location[facility]  Service: Urology;  Laterality: Left;  . MOHS SURGERY  2015   skin cancer  . TONSILLECTOMY  age 31    His Family History Is Significant For: Family History  Problem Relation Age of Onset  . CAD Mother        MI  . Diabetes Mother   . Stroke Mother        mini-stroke  . Cancer Father 48       lung (smoker)  . Heart disease Paternal Grandmother   . Heart disease Paternal Grandfather   . Diabetes Paternal Grandfather   . Cancer Sister        lung  . Coronary artery disease Neg Hx        Premature    His Social History Is Significant For: Social History   Socioeconomic History  . Marital status: Married    Spouse name: Not on  file  . Number of children: Not on file  . Years of education: Not on file  . Highest education level: Not on file  Occupational History  . Occupation: Full time    Employer: [employer]  Social Needs  . Financial resource strain: Not on file  . Food insecurity:    Worry: Not on file    Inability: Not on file  . Transportation needs:    Medical: Not on file    Non-medical: Not on file  Tobacco Use  . Smoking status: Former Smoker    Packs/day: 2.00    Years: 35.00    Pack years: 70.00    Types: Cigarettes    Last attempt to quit: 01/29/1990    Years since quitting: 27.9  . Smokeless tobacco: Never Used  Substance and Sexual Activity  . Alcohol use: No  . Drug use: No  . Sexual activity: Yes  Lifestyle  . Physical activity:    Days per week: Not on file    Minutes per session: Not on file  . Stress: Not on file  Relationships  . Social connections:    Talks on phone: Not on file    Gets together: Not on file    Attends religious service: Not on file    Active member of club or organization: Not on file    Attends meetings of clubs or organizations: Not on file    Relationship status: Not on file  Other Topics Concern  . Not on file  Social History Narrative   Lives with wife, dog and cats    Occupation: retired, was self employed, now works at [employer]    Edu: HS   Activity: walks 1.5 mi daily   Diet: some water, fruits/vegetables daily    His Allergies Are:  Allergies  Allergen Reactions  .  Sulfa Antibiotics Hives  :   His Current Medications Are:  Outpatient Encounter Medications as of 12/19/2017  Medication Sig  . albuterol (PROVENTIL HFA;VENTOLIN HFA) 108 (90 Base) MCG/ACT inhaler INHALE 2 PUFFS BY MOUTH EVERY 6 HOURS AS NEEDED FOR WHEEZE  . aspirin 81 MG EC tablet Take 1 tablet (81 mg total) by mouth daily.  Marland Kitchen atenolol (TENORMIN) 25 MG tablet TAKE 1 TABLET BY MOUTH EVERY DAY  . atorvastatin (LIPITOR) 20 MG tablet TAKE 1 TABLET (20 MG TOTAL)  BY MOUTH DAILY.  Marland Kitchen Blood Glucose Monitoring Suppl (BLOOD GLUCOSE MONITOR SYSTEM) w/Device KIT 1 each by Does not apply route daily. Use as directed to check blood sugar once daily  . Cyanocobalamin (VITAMIN B 12 PO) Take 1 tablet by mouth daily.  . diclofenac sodium (VOLTAREN) 1 % GEL APPLY 2 G TOPICALLY 3 (THREE) TIMES DAILY AS NEEDED (ANTI INFLAMMATORY).  Marland Kitchen diclofenac sodium (VOLTAREN) 1 % GEL APPLY 2 G TOPICALLY TO AFFECTED AREA 3 (THREE) TIMES DAILY AS NEEDED (ANTI INFLAMMATORY).  Marland Kitchen gabapentin (NEURONTIN) 100 MG capsule TAKE 1 CAPSULE BY MOUTH AT BEDTIME ALONG WITH 300 MG TO EQUAL 400 MG  . gabapentin (NEURONTIN) 300 MG capsule Take 1 capsule (300 mg total) by mouth 2 (two) times daily. Take 1 caps in AM and 1 caps at bedtime along with 157m cap (total 4043m  . Glucose Blood (BLOOD GLUCOSE TEST STRIPS) STRP 1 each by In Vitro route daily. Use as directed to check blood sugar once daily  . ibuprofen (ADVIL,MOTRIN) 200 MG tablet Take 400-600 mg by mouth every 6 (six) hours as needed for mild pain (DEPENDS ON PAIN IF TAKES 400-600 MG).   . Lancets MISC 1 each by Does not apply route daily. Use as directed to check blood sugar daily  . losartan (COZAAR) 100 MG tablet TAKE 1 TABLET EVERY DAY  . metFORMIN (GLUCOPHAGE) 1000 MG tablet TAKE 1 TABLET TWICE DAILY WITH A MEAL  . [DISCONTINUED] guaiFENesin-codeine (CHERATUSSIN AC) 100-10 MG/5ML syrup Take 5 mLs by mouth 2 (two) times daily as needed for cough (sedation precautions). (Patient not taking: Reported on 11/01/2017)   No facility-administered encounter medications on file as of 12/19/2017.   :  Review of Systems:  Out of a complete 14 point review of systems, all are reviewed and negative with the exception of these symptoms as listed below: Review of Systems  Neurological:       Pt presents today to discuss his cpap. Pt has felt "under the weather " recently and had a hard time with his cpap.    Objective:  Neurological Exam  Physical  Exam Physical Examination:   Vitals:   12/19/17 1110  BP: (!) 162/80  Pulse: 74    General Examination: The patient is a very pleasant [age].o. male in no acute distress. He appears well-developed and well-nourished and well groomed.   HEENT:Normocephalic, atraumatic, pupils are equal, round and reactive to light and accommodation. Corrective eye glasses.Extraocular tracking is good without limitation to gaze excursion or nystagmus noted. Normal smooth pursuit is noted. Hearing is mildly impaired. Face is symmetric with normal facial animation and normal facial sensation. Speech is clear with no dysarthria noted. There is no hypophonia. There is no lip, neck/head, jaw or voice tremor. Neck is supple with full range of passive and active motion. Oropharynx exam reveals:mildmouth dryness, adequatedental hygiene with full dentures on top and partials on the bottom,and moderateairway crowding, no significant drainage, no pharyngeal erythema, no nasal erythema noted.  Tongue protrudes centrally and palate elevates symmetrically. Tonsils are absent.    Chest:Clear to auscultation without wheezing, rhonchi or crackles noted.  Heart:S1+S2+0, regular and normal without murmurs, rubs or gallops noted.   Abdomen:Soft, non-tender and non-distended with normal bowel sounds appreciated on auscultation.  Extremities:There isno pitting edema in the distal lower extremities.   Skin: Warm and dry without trophic changes noted.  Musculoskeletal: exam reveals no obvious joint deformities, tenderness or joint swelling or erythema.   Neurologically:  Mental status: The patient is awake, alert and oriented in all 4 spheres.Hisimmediate and remote memory, attention, language skills and fund of knowledge are appropriate. There is no evidence of aphasia, agnosia, apraxia or anomia. Speech is clear with normal prosody and enunciation. Thought process is linear. Mood is normaland affect is normal.   Cranial nerves II - XII are as described above under HEENT exam.  Motor exam: Normal bulk, strength and tone is noted. There is no tremor. Fine motor skills and coordination: grossly intact.  Cerebellar testing: No dysmetria or intention tremor. There is no truncal or gait ataxia.  Sensory exam: intact to light touch inthe upper and lower extremities.  Gait, station and balance:Hestands with some difficulty. No veering to one side is noted.Posture with increase in lumbar kyphosis and tilt to R, both stable.Gaitis slow and cautious.Tandem walk isnot tested for safety.  Assessmentand Plan:  In summary,Casey Golden a very pleasant 67 year oldmalewith an underlying medical history of type 2 diabetes, allergic rhinitis, hypertension, hyperlipidemia, reflux disease, fatty liver, coronary artery disease, arthritis and obesity who presents for follow-up consultation of his obstructive sleep apnea. He carries a prior diagnosis of sleep apnea. Mild sleep apnea was confirmed via home sleep testing on 01/14/2017 which indicated an AHI of 9.5 per hour, average oxygen saturation of 93% but O2 nadir was 69% with time below 89% saturation of 24 minutes for the night. He had been compliant with AutoPap therapy but it interim pulse oximetry test while in treatment at home showed ongoing desaturations. He was therefore advised to come back for a full night titration study. He has established treatment on CPAP of 7 cm after his CPAP titration study from 08/14/2017. We talked about his test results. He has been compliant with CPAP. Leak has been on the higher side, he uses a nasal mask and declined trying a full facemask and did not wish to try a chinstrap. In the past few days, about 5 days he has been feeling "under the weather" and reports subjective fevers and chills, no productive cough, some myalgias reported. He is encouraged to get in touch with his primary care physician's office and see about  getting seen today for possible. Sleep apnea standpoint he is compliant with treatment, he is advised to follow-up in one year routinely. I answered all his questions today and he was in agreement. I spent 25 minutes in total face-to-face time with the patient, more than 50% of which was spent in counseling and coordination of care, reviewing test results, reviewing medication and discussing or reviewing the diagnosis of OSA, its prognosis and treatment options. Pertinent laboratory and imaging test results that were available during this visit with the patient were reviewed by me and considered in my medical decision making (see chart for details).

## 2017-12-19 NOTE — Patient Instructions (Addendum)
Please call Dr. Danise Mina' office for your recent illness, flu is not excluded, as you had muscle aches and fevers. Please ask for an appointment if possible today.   Please continue using your CPAP regularly. While your insurance requires that you use CPAP at least 4 hours each night on 70% of the nights, I recommend, that you not skip any nights and use it throughout the night if you can. Getting used to CPAP and staying with the treatment long term does take time and patience and discipline. Untreated obstructive sleep apnea when it is moderate to severe can have an adverse impact on cardiovascular health and raise her risk for heart disease, arrhythmias, hypertension, congestive heart failure, stroke and diabetes. Untreated obstructive sleep apnea causes sleep disruption, nonrestorative sleep, and sleep deprivation. This can have an impact on your day to day functioning and cause daytime sleepiness and impairment of cognitive function, memory loss, mood disturbance, and problems focussing. Using CPAP regularly can improve these symptoms.  Keep up the good work! I will see you back in one year.

## 2017-12-21 ENCOUNTER — Other Ambulatory Visit: Payer: Self-pay | Admitting: Podiatry

## 2017-12-24 NOTE — Telephone Encounter (Signed)
Orders of 02/05/2017 state pt is to take Gabapentin 400mg  (300mg  + 100mg ) morning and at bedtime.

## 2018-01-09 ENCOUNTER — Ambulatory Visit: Payer: Managed Care, Other (non HMO) | Admitting: Adult Health

## 2018-01-17 ENCOUNTER — Other Ambulatory Visit: Payer: Self-pay | Admitting: Podiatry

## 2018-01-27 ENCOUNTER — Ambulatory Visit (INDEPENDENT_AMBULATORY_CARE_PROVIDER_SITE_OTHER): Payer: Managed Care, Other (non HMO) | Admitting: Podiatry

## 2018-01-27 ENCOUNTER — Encounter: Payer: Self-pay | Admitting: Podiatry

## 2018-01-27 DIAGNOSIS — B351 Tinea unguium: Secondary | ICD-10-CM | POA: Diagnosis not present

## 2018-01-27 DIAGNOSIS — E1142 Type 2 diabetes mellitus with diabetic polyneuropathy: Secondary | ICD-10-CM

## 2018-01-27 DIAGNOSIS — M79676 Pain in unspecified toe(s): Secondary | ICD-10-CM | POA: Diagnosis not present

## 2018-01-27 NOTE — Progress Notes (Signed)
Complaint:  Visit Type: Patient returns to my office for continued preventative foot care services. Complaint: Patient states" my nails have grown long and thick and become painful to walk and wear shoes" Patient has been diagnosed with DM with neuropathy. The patient presents for preventative foot care services. No changes to ROS  Podiatric Exam: Vascular: dorsalis pedis and posterior tibial pulses are palpable bilateral. Capillary return is immediate. Temperature gradient is WNL. Skin turgor WNL  Sensorium: Absent  Semmes Weinstein monofilament test. Diminished  tactile sensation bilaterally. Nail Exam: Pt has thick disfigured discolored nails with subungual debris noted bilateral entire nail hallux through fifth toenails Ulcer Exam: There is no evidence of ulcer or pre-ulcerative changes or infection. Orthopedic Exam: Muscle tone and strength are WNL. No limitations in general ROM. No crepitus or effusions noted. HAV with hallux malleus. Tailors Bunion  B/L Skin: No Porokeratosis. No infection or ulcers  Diagnosis:  Onychomycosis, , Pain in right toe, pain in left toes,  DPN  HAV with hallux malleus.  Tailors bunion  B/L  Treatment & Plan Procedures and Treatment: Consent by patient was obtained for treatment procedures. The patient understood the discussion of treatment and procedures well. All questions were answered thoroughly reviewed. Debridement of mycotic and hypertrophic toenails, 1 through 5 bilateral and clearing of subungual debris. No ulceration, no infection noted. Return Visit-Office Procedure: Patient instructed to return to the office for a follow up visit 3 months for continued evaluation and treatment.    Apolinar Bero DPM 

## 2018-03-11 ENCOUNTER — Other Ambulatory Visit: Payer: Self-pay | Admitting: Cardiovascular Disease

## 2018-03-26 ENCOUNTER — Other Ambulatory Visit: Payer: Self-pay | Admitting: Family Medicine

## 2018-03-31 NOTE — Progress Notes (Signed)
Cardiology Office Note  Date:  04/02/2018   ID:  Casey Golden, DOB 07-Mar-1950, MRN [id number]  PCP:  Ria Bush, MD   Chief Complaint  Patient presents with  . other    1 year F/U. Medications reviewed with patient verbally.    HPI:  Casey Golden is a very pleasant 68 year old gentleman returns today for evaluation and management of his coronary artery disease,  Former smoker, quit 1990,  smoking for 27 years,   He has 50% mid RCA disease by cardiac catheterization in 2009 with 30% also LAD disease  mixed hyperlipidemia, hypertension.  syncopal episode many years ago while walking his dog. He was down for one minute. He had no warning. He has not had any further episodes since that time cardiac catheterization 3 in the past Who presents for follow-up of his coronary artery disease  Lab work reviewed with him in detail  HBA1C 6.0, best numbers to date Total chol 105, LDL 43  Reports he is losing weight Long discussion concerning his diet Active, continues to work at [employer] work stress  Chronic back pain  Previously did physical therapy was told he might need back surgery  Denies any chest pain concerning for angina  EKG personally reviewed by myself on todays visit Shows normal sinus rhythm with rate 59 bpm no significant ST or T wave changes  Other past medical history reviewed Cardiac catheterization report 05/13/2006 detailing normal ejection fraction, 50% mid RCA disease  cardiac catheterization November 2009 showing 40% mid RCA disease, normal ejection fraction   testicular surgery 09/14/2014 in [place] with Dr. Junious Silk.  positive Myoview several years ago which resulted in a heart catheterization.  He had nonobstructive disease. His normal left ventricular function.   PMH:   has a past medical history of Arthritis, CAD (coronary artery disease), Colonic polyp, Fatty liver disease, nonalcoholic (1517), GERD (gastroesophageal reflux disease),  History of chicken pox, Hyperlipidemia, Hypertension, Nocturia, OSA (obstructive sleep apnea), Rash of genital area, Seasonal and perennial allergic rhinitis, Type 2 diabetes mellitus ([place]), Wears dentures, and Wears glasses.  PSH:    Past Surgical History:  Procedure Laterality Date  . ABDOMINAL HERNIA REPAIR  2007      [facility]   open repair  . APPENDECTOMY  age 21  . CARDIAC CATHETERIZATION  12-23-2007   [facility]   Abnormal myoview w/ ischemia/  40% mRCA with nonobstructive and no sig. plaque in his left system, EF 55%  . CARDIAC CATHETERIZATION  Apr 2008    [facility]   Abnormal myoview/  50% RCA,  ef 65%  . CARDIAC CATHETERIZATION  1999      [facility]  . CERVICAL FUSION  1992  . CHOLECYSTECTOMY OPEN  2006  . COLONOSCOPY WITH PROPOFOL N/A 04/17/2016   TAs, high grade dysplasia with margins clear, diverticulosis Lucilla Lame, MD)  . COLONOSCOPY WITH PROPOFOL N/A 08/07/2016   TAx1, diverticulosis, rpt 3 yrs ([facility])  . DENTAL SURGERY     metal dental implant L mandible  . EXCISION OF SKIN TAG Right 09/14/2014   Procedure: EXCISION OF SKIN TAG;  Surgeon: Festus Aloe, MD;  Location: [facility];  Service: Urology;  Laterality: Right;  . HYDROCELE EXCISION Left 09/14/2014   Procedure: LEFT HYDROCELECTOMY ADULT;  Surgeon: Festus Aloe, MD;  Location: [facility];  Service: Urology;  Laterality: Left;  . MOHS SURGERY  2015   skin cancer  . TONSILLECTOMY  age 5    Current Outpatient Medications  Medication  Sig Dispense Refill  . albuterol (PROVENTIL HFA;VENTOLIN HFA) 108 (90 Base) MCG/ACT inhaler INHALE 2 PUFFS BY MOUTH EVERY 6 HOURS AS NEEDED FOR WHEEZING 8.5 Inhaler 2  . aspirin 81 MG EC tablet Take 1 tablet (81 mg total) by mouth daily. 30 tablet 12  . atenolol (TENORMIN) 25 MG tablet TAKE 1 TABLET BY MOUTH EVERY DAY 90 tablet 3  . atorvastatin (LIPITOR) 20 MG tablet TAKE 1 TABLET (20 MG TOTAL) BY MOUTH DAILY. 90 tablet 3  . Blood Glucose Monitoring Suppl (BLOOD  GLUCOSE MONITOR SYSTEM) w/Device KIT 1 each by Does not apply route daily. Use as directed to check blood sugar once daily 1 each 0  . Cyanocobalamin (VITAMIN B 12 PO) Take 1 tablet by mouth daily.    . diclofenac sodium (VOLTAREN) 1 % GEL APPLY 2 G TOPICALLY 3 (THREE) TIMES DAILY AS NEEDED (ANTI INFLAMMATORY). 100 g 1  . gabapentin (NEURONTIN) 100 MG capsule TAKE 1 CAPSULE BY MOUTH AT BEDTIME ALONG WITH 300 MG TO EQUAL 400 MG 90 capsule 3  . gabapentin (NEURONTIN) 300 MG capsule TAKE 1 CAPSULE BY MOUTH TWICE A DAY - 1 IN THE MORNING AND 1 AT BEDTIME ALONG WITH 100 MG CAPSULE 180 capsule 3  . Glucose Blood (BLOOD GLUCOSE TEST STRIPS) STRP 1 each by In Vitro route daily. Use as directed to check blood sugar once daily 100 each 0  . ibuprofen (ADVIL,MOTRIN) 200 MG tablet Take 400-600 mg by mouth every 6 (six) hours as needed for mild pain (DEPENDS ON PAIN IF TAKES 400-600 MG).     . Lancets MISC 1 each by Does not apply route daily. Use as directed to check blood sugar daily 100 each 0  . losartan (COZAAR) 100 MG tablet TAKE 1 TABLET BY MOUTH EVERY DAY 90 tablet 3  . metFORMIN (GLUCOPHAGE) 1000 MG tablet TAKE 1 TABLET TWICE DAILY WITH A MEAL 180 tablet 1  . gabapentin (NEURONTIN) 100 MG capsule TAKE 1 CAPSULE BY MOUTH AT BEDTIME ALONG WITH 300 MG TO EQUAL 400 MG  3   No current facility-administered medications for this visit.      Allergies:   Sulfa antibiotics   Social History:  The patient  reports that he quit smoking about 28 years ago. His smoking use included cigarettes. He has a 70.00 pack-year smoking history. He has never used smokeless tobacco. He reports that he does not drink alcohol or use drugs.   Family History:   family history includes CAD in his mother; Cancer in his sister; Cancer (age of onset: 37) in his father; Diabetes in his mother and paternal grandfather; Heart disease in his paternal grandfather and paternal grandmother; Stroke in his mother.    Review of  Systems: Review of Systems  Constitutional: Negative.   Respiratory: Negative.   Cardiovascular: Negative.   Gastrointestinal: Negative.   Musculoskeletal: Positive for back pain and joint pain.  Neurological: Negative.   Psychiatric/Behavioral: Negative.   All other systems reviewed and are negative.    PHYSICAL EXAM: VS:  BP 136/76 (BP Location: Left Arm, Patient Position: Sitting, Cuff Size: Normal)   Pulse (!) 59   Ht 5' 8.5" (1.74 m)   Wt 245 lb 8 oz (111.4 kg)   SpO2 98%   BMI 36.79 kg/m  , BMI Body mass index is 36.79 kg/m. Constitutional:  oriented to person, place, and time. No distress.  Obese HENT:  Head: Grossly normal Eyes:  no discharge. No scleral icterus.  Neck: No JVD,  no carotid bruits  Cardiovascular: Regular rate and rhythm, no murmurs appreciated Pulmonary/Chest: Clear to auscultation bilaterally, no wheezes or rails Abdominal: Soft.  no distension.  no tenderness.  Musculoskeletal: Normal range of motion Neurological:  normal muscle tone. Coordination normal. No atrophy Skin: Skin warm and dry Psychiatric: normal affect, pleasant   Recent Labs: No results found for requested labs within last 8760 hours.    Lipid Panel Lab Results  Component Value Date   CHOL 105 03/21/2017   HDL 28.40 (L) 03/21/2017   LDLCALC 43 03/21/2017   TRIG 166.0 (H) 03/21/2017      Wt Readings from Last 3 Encounters:  04/02/18 245 lb 8 oz (111.4 kg)  12/19/17 246 lb (111.6 kg)  11/01/17 243 lb (110.2 kg)      ASSESSMENT AND PLAN:  Coronary artery disease involving native coronary artery of native heart without angina pectoris - Plan: EKG 12-Lead Active with no regular exercise program, denies anginal symptoms Diabetes numbers, cholesterol numbers discussed with him in detail, at goal No further work-up ordered  Essential hypertension Blood pressure well controlled, no changes made  Uncontrolled type 2 diabetes with neuropathy (HCC) Hemoglobin A1c  improved Discussed his diet with him, recommended walking program Overall doing much better  Osteoarthritis Chronic knee pain, back pain Recommended weight loss, exercise  Chronic midline low back pain, with sciatica presence unspecified Previously seen by chiropractor Recommended good posture, exercise, weight loss  Disposition:   F/U  12 months   Total encounter time more than 25 minutes  Greater than 50% was spent in counseling and coordination of care with the patient    Orders Placed This Encounter  Procedures  . EKG 12-Lead     Signed, Esmond Plants, M.D., Ph.D. 04/02/2018  [place], [place]

## 2018-04-02 ENCOUNTER — Ambulatory Visit (INDEPENDENT_AMBULATORY_CARE_PROVIDER_SITE_OTHER): Payer: 59 | Admitting: Cardiovascular Disease

## 2018-04-02 ENCOUNTER — Encounter: Payer: Self-pay | Admitting: Cardiovascular Disease

## 2018-04-02 VITALS — BP 136/76 | HR 59 | Ht 68.5 in | Wt 245.5 lb

## 2018-04-02 DIAGNOSIS — E1165 Type 2 diabetes mellitus with hyperglycemia: Secondary | ICD-10-CM

## 2018-04-02 DIAGNOSIS — I10 Essential (primary) hypertension: Secondary | ICD-10-CM

## 2018-04-02 DIAGNOSIS — I25118 Atherosclerotic heart disease of native coronary artery with other forms of angina pectoris: Secondary | ICD-10-CM | POA: Diagnosis not present

## 2018-04-02 DIAGNOSIS — E1140 Type 2 diabetes mellitus with diabetic neuropathy, unspecified: Secondary | ICD-10-CM | POA: Diagnosis not present

## 2018-04-02 DIAGNOSIS — E782 Mixed hyperlipidemia: Secondary | ICD-10-CM | POA: Diagnosis not present

## 2018-04-02 DIAGNOSIS — IMO0002 Reserved for concepts with insufficient information to code with codable children: Secondary | ICD-10-CM

## 2018-04-02 NOTE — Patient Instructions (Addendum)

## 2018-04-07 ENCOUNTER — Telehealth: Payer: Self-pay

## 2018-04-07 MED ORDER — OSELTAMIVIR PHOSPHATE 75 MG PO CAPS: 75.0000 mg | ORAL_CAPSULE | Freq: Two times a day (BID) | ORAL | 0 refills | Status: DC

## 2018-04-07 MED ORDER — OSELTAMIVIR PHOSPHATE 75 MG PO CAPS: 75.0000 mg | ORAL_CAPSULE | Freq: Every day | ORAL | 0 refills | Status: DC

## 2018-04-07 NOTE — Telephone Encounter (Signed)
[name] notified as instructed by telephone.  He states his wife has already picked it up for him.

## 2018-04-07 NOTE — Telephone Encounter (Signed)
Pt said that his daughter was dx with the flu today. Pt request tamiflu to [facility] in [place]. Pt is not having any symptoms at this time. Please advise.

## 2018-04-07 NOTE — Telephone Encounter (Addendum)
plz notify sent in. Take once daily for 10 days preventative dosing.

## 2018-04-07 NOTE — Addendum Note (Signed)
Addended by: Ria Bush on: 04/07/2018 02:02 PM   Modules accepted: Orders

## 2018-04-09 ENCOUNTER — Other Ambulatory Visit: Payer: Self-pay | Admitting: Cardiovascular Disease

## 2018-04-21 ENCOUNTER — Telehealth: Payer: Self-pay

## 2018-04-21 NOTE — Telephone Encounter (Signed)
Pt could not get on mychart and wanted to know if he had missed his appt for AWV. I advised no at this time pt has appt on 05/22/18 at 10 Am with Lattie Haw for 1st part of AWV and fasting labs and on 05/26/18 at 10:30 has appt with Dr Darnell Level for AWV. Pt voiced understanding and will keep appts unless receives call to change appts.

## 2018-04-28 ENCOUNTER — Ambulatory Visit (INDEPENDENT_AMBULATORY_CARE_PROVIDER_SITE_OTHER): Payer: Medicare Other | Admitting: Podiatry

## 2018-04-28 ENCOUNTER — Ambulatory Visit: Payer: Medicare Other | Admitting: Podiatry

## 2018-04-28 ENCOUNTER — Other Ambulatory Visit: Payer: Self-pay

## 2018-04-28 ENCOUNTER — Encounter: Payer: Self-pay | Admitting: Podiatry

## 2018-04-28 DIAGNOSIS — E1142 Type 2 diabetes mellitus with diabetic polyneuropathy: Secondary | ICD-10-CM | POA: Diagnosis not present

## 2018-04-28 DIAGNOSIS — B351 Tinea unguium: Secondary | ICD-10-CM | POA: Diagnosis not present

## 2018-04-28 DIAGNOSIS — M79676 Pain in unspecified toe(s): Secondary | ICD-10-CM | POA: Diagnosis not present

## 2018-04-28 NOTE — Progress Notes (Signed)
Complaint:  Visit Type: Patient returns to my office for continued preventative foot care services. Complaint: Patient states" my nails have grown long and thick and become painful to walk and wear shoes" Patient has been diagnosed with DM with neuropathy. The patient presents for preventative foot care services. No changes to ROS  Podiatric Exam: Vascular: dorsalis pedis and posterior tibial pulses are palpable bilateral. Capillary return is immediate. Temperature gradient is WNL. Skin turgor WNL  Sensorium: Absent  Semmes Weinstein monofilament test. Diminished  tactile sensation bilaterally. Nail Exam: Pt has thick disfigured discolored nails with subungual debris noted bilateral entire nail hallux through fifth toenails Ulcer Exam: There is no evidence of ulcer or pre-ulcerative changes or infection. Orthopedic Exam: Muscle tone and strength are WNL. No limitations in general ROM. No crepitus or effusions noted. HAV with hallux malleus. Tailors Bunion  B/L Skin: No Porokeratosis. No infection or ulcers  Diagnosis:  Onychomycosis, , Pain in right toe, pain in left toes,  DPN  HAV with hallux malleus.  Tailors bunion  B/L  Treatment & Plan Procedures and Treatment: Consent by patient was obtained for treatment procedures. The patient understood the discussion of treatment and procedures well. All questions were answered thoroughly reviewed. Debridement of mycotic and hypertrophic toenails, 1 through 5 bilateral and clearing of subungual debris. No ulceration, no infection noted. Return Visit-Office Procedure: Patient instructed to return to the office for a follow up visit 3 months for continued evaluation and treatment.    Gardiner Barefoot DPM

## 2018-05-16 ENCOUNTER — Telehealth: Payer: Self-pay

## 2018-05-16 NOTE — Telephone Encounter (Signed)
I do not see any messages or results to relay to pt or where anyone from our office tried to contact him.

## 2018-05-16 NOTE — Telephone Encounter (Signed)
Copied from [place] [id number]. Topic: General - Other >> May 16, 2018 10:15 AM Carolyn Stare wrote:  Pt lvm on [facility] appt line , said he was returning a call

## 2018-05-21 ENCOUNTER — Ambulatory Visit (INDEPENDENT_AMBULATORY_CARE_PROVIDER_SITE_OTHER): Payer: 59

## 2018-05-21 DIAGNOSIS — Z0000 Encounter for general adult medical examination without abnormal findings: Secondary | ICD-10-CM | POA: Diagnosis not present

## 2018-05-21 NOTE — Patient Instructions (Signed)
Casey Golden , Thank you for taking time to come for your Medicare Wellness Visit. I appreciate your ongoing commitment to your health goals. Please review the following plan we discussed and let me know if I can assist you in the future.   These are the goals we discussed: Goals    . Increase physical activity     Starting 05/21/2018, I will continue to walk 45-60 minutes daily.        This is a list of the screening recommended for you and due dates:  Health Maintenance  Topic Date Due  . Hemoglobin A1C  05/22/2018*  . DTaP/Tdap/Td vaccine (1 - Tdap) 10/26/2025*  . Flu Shot  08/30/2018  . Complete foot exam   10/02/2018  . Eye exam for diabetics  11/03/2018  . Colon Cancer Screening  08/08/2019  . Tetanus Vaccine  10/26/2025  .  Hepatitis C: One time screening is recommended by Center for Disease Control  (CDC) for  adults born from [birth year] through 1967.   Completed  . Pneumonia vaccines  Completed  *Topic was postponed. The date shown is not the original due date.   Preventive Care for Adults  A healthy lifestyle and preventive care can promote health and wellness. Preventive health guidelines for adults include the following key practices.  . A routine yearly physical is a good way to check with your health care provider about your health and preventive screening. It is a chance to share any concerns and updates on your health and to receive a thorough exam.  . Visit your dentist for a routine exam and preventive care every 6 months. Brush your teeth twice a day and floss once a day. Good oral hygiene prevents tooth decay and gum disease.  . The frequency of eye exams is based on your age, health, family medical history, use  of contact lenses, and other factors. Follow your health care provider's recommendations for frequency of eye exams.  . Eat a healthy diet. Foods like vegetables, fruits, whole grains, low-fat dairy products, and lean protein foods contain the nutrients you need  without too many calories. Decrease your intake of foods high in solid fats, added sugars, and salt. Eat the right amount of calories for you. Get information about a proper diet from your health care provider, if necessary.  . Regular physical exercise is one of the most important things you can do for your health. Most adults should get at least 150 minutes of moderate-intensity exercise (any activity that increases your heart rate and causes you to sweat) each week. In addition, most adults need muscle-strengthening exercises on 2 or more days a week.  Silver Sneakers may be a benefit available to you. To determine eligibility, you may visit the website: www.silversneakers.com or contact program at 567-230-8357 Mon-Fri between 8AM-8PM.   . Maintain a healthy weight. The body mass index (BMI) is a screening tool to identify possible weight problems. It provides an estimate of body fat based on height and weight. Your health care provider can find your BMI and can help you achieve or maintain a healthy weight.   For adults 20 years and older: ? A BMI below 18.5 is considered underweight. ? A BMI of 18.5 to 24.9 is normal. ? A BMI of 25 to 29.9 is considered overweight. ? A BMI of 30 and above is considered obese.   . Maintain normal blood lipids and cholesterol levels by exercising and minimizing your intake of saturated fat. Eat a  balanced diet with plenty of fruit and vegetables. Blood tests for lipids and cholesterol should begin at age 90 and be repeated every 5 years. If your lipid or cholesterol levels are high, you are over 50, or you are at high risk for heart disease, you may need your cholesterol levels checked more frequently. Ongoing high lipid and cholesterol levels should be treated with medicines if diet and exercise are not working.  . If you smoke, find out from your health care provider how to quit. If you do not use tobacco, please do not start.  . If you choose to drink  alcohol, please do not consume more than 2 drinks per day. One drink is considered to be 12 ounces (355 mL) of beer, 5 ounces (148 mL) of wine, or 1.5 ounces (44 mL) of liquor.  . If you are 66-36 years old, ask your health care provider if you should take aspirin to prevent strokes.  . Use sunscreen. Apply sunscreen liberally and repeatedly throughout the day. You should seek shade when your shadow is shorter than you. Protect yourself by wearing long sleeves, pants, a wide-brimmed hat, and sunglasses year round, whenever you are outdoors.  . Once a month, do a whole body skin exam, using a mirror to look at the skin on your back. Tell your health care provider of new moles, moles that have irregular borders, moles that are larger than a pencil eraser, or moles that have changed in shape or color.

## 2018-05-21 NOTE — Progress Notes (Signed)
PCP notes:   Health maintenance:  A1C - ordered by PCP   Abnormal screenings:   Mini-Cog score: 16/17 MMSE - Mini Mental State Exam 05/21/2018 05/13/2017  Orientation to time 5 5  Orientation to Place 5 5  Registration 3 3  Attention/ Calculation 0 0  Recall 2 2  Recall-comments unable to recall 1 of 3 words unable to recall 1 of 3 words  Language- name 2 objects 0 0  Language- repeat 1 1  Language- follow 3 step command 0 1  Language- follow 3 step command-comments - unable to follow 1 step of 3 step command  Language- read & follow direction 0 0  Write a sentence 0 0  Copy design 0 0  Total score 16 17    Patient concerns:   None  Nurse concerns:  None  Next PCP appt:   05/26/18 @ 1030

## 2018-05-21 NOTE — Progress Notes (Signed)
Subjective:   Casey Golden is a 68 y.o. male who presents for Medicare Annual/Subsequent preventive examination.  Review of Systems:  N/A Cardiac Risk Factors include: advanced age (>65mn, >>29women);diabetes mellitus;dyslipidemia;hypertension;male gender;obesity (BMI >30kg/m2)     Objective:    Vitals: There were no vitals taken for this visit.  There is no height or weight on file to calculate BMI.  Advanced Directives 05/21/2018 11/01/2017 05/13/2017 08/07/2016 04/17/2016 01/03/2016 09/14/2014  Does Patient Have a Medical Advance Directive? Yes Yes Yes No No No No  Type of [place]amed[place] Living will;Healthcare Power of Attorney Living will - - - -  Does patient want to make changes to medical advance directive? - No - Patient declined - - - - -  Copy of HNaranjitoin Chart? Yes - validated most recent copy scanned in chart (See row information) - - - - - -  Would patient like information on creating a medical advance directive? - - - No - Patient declined No - Patient declined No - Patient declined No - patient declined information    Tobacco Social History   Tobacco Use  Smoking Status Former Smoker  . Packs/day: 2.00  . Years: 35.00  . Pack years: 70.00  . Types: Cigarettes  . Last attempt to quit: 01/29/1990  . Years since quitting: 28.3  Smokeless Tobacco Never Used     Counseling given: No   Clinical Intake:  Pre-visit preparation completed: Yes  Pain Score: 0-No pain     Nutritional Status: BMI > 30  Obese Nutritional Risks: None Diabetes: Yes CBG done?: No Did pt. bring in CBG monitor from home?: No  How often do you need to have someone help you when you read instructions, pamphlets, or other written materials from your doctor or pharmacy?: 1 - Never What is the last grade level you completed in school?: 12th grade + multiple college courses  Interpreter Needed?: No  Comments: pt lives with  spouse Information entered by :: LPinson, LPN  Past Medical History:  Diagnosis Date  . Arthritis   . CAD (coronary artery disease)    Cardiologist--  Dr. TIda Rogue . Cataract 2019   bilateral; resolved with surgery  . Colonic polyp   . Fatty liver disease, nonalcoholic 20240  by [place] . GERD (gastroesophageal reflux disease)   . History of chicken pox   . Hyperlipidemia   . Hypertension   . Nocturia   . OSA (obstructive sleep apnea)    per pt no cpap due to sleep center/ insurance issue-- study done 2014  . Rash of genital area    09-08-2014  per pt Dr EJunious Silkaware  . Seasonal and perennial allergic rhinitis   . Type 2 diabetes mellitus ([place]   . Wears dentures    full upper/  partial lower  . Wears glasses    Past Surgical History:  Procedure Laterality Date  . ABDOMINAL HERNIA REPAIR  2007      [facility]   open repair  . APPENDECTOMY  age 68 . CARDIAC CATHETERIZATION  12-23-2007   [facility]   Abnormal myoview w/ ischemia/  40% mRCA with nonobstructive and no sig. plaque in his left system, EF 55%  . CARDIAC CATHETERIZATION  Apr 2008    [facility]   Abnormal myoview/  50% RCA,  ef 65%  . CARDIAC CATHETERIZATION  1999      [facility]  . CATARACT EXTRACTION, BILATERAL  Bilateral 11/2017  . CERVICAL FUSION  1992  . CHOLECYSTECTOMY OPEN  2006  . COLONOSCOPY WITH PROPOFOL N/A 04/17/2016   TAs, high grade dysplasia with margins clear, diverticulosis Lucilla Lame, MD)  . COLONOSCOPY WITH PROPOFOL N/A 08/07/2016   TAx1, diverticulosis, rpt 3 yrs ([place])  . DENTAL SURGERY     metal dental implant L mandible  . EXCISION OF SKIN TAG Right 09/14/2014   Procedure: EXCISION OF SKIN TAG;  Surgeon: Festus Aloe, MD;  [facility];  Service: Urology;  Laterality: Right;  . HYDROCELE EXCISION Left 09/14/2014   Procedure: LEFT HYDROCELECTOMY ADULT;  Surgeon: Festus Aloe, MD;  Location: [facility];  Service: Urology;  Laterality: Left;  . MOHS SURGERY   2015   skin cancer  . TONSILLECTOMY  age 47   Family History  Problem Relation Age of Onset  . CAD Mother        MI  . Diabetes Mother   . Stroke Mother        mini-stroke  . Cancer Father 50       lung (smoker)  . Heart disease Paternal Grandmother   . Heart disease Paternal Grandfather   . Diabetes Paternal Grandfather   . Cancer Sister        lung  . Coronary artery disease Neg Hx        Premature   Social History   Socioeconomic History  . Marital status: Married    Spouse name: Not on file  . Number of children: Not on file  . Years of education: Not on file  . Highest education level: Not on file  Occupational History  . Occupation: Full time    Employer: [employer]  Social Needs  . Financial resource strain: Not on file  . Food insecurity:    Worry: Not on file    Inability: Not on file  . Transportation needs:    Medical: Not on file    Non-medical: Not on file  Tobacco Use  . Smoking status: Former Smoker    Packs/day: 2.00    Years: 35.00    Pack years: 70.00    Types: Cigarettes    Last attempt to quit: 01/29/1990    Years since quitting: 28.3  . Smokeless tobacco: Never Used  Substance and Sexual Activity  . Alcohol use: No  . Drug use: No  . Sexual activity: Yes  Lifestyle  . Physical activity:    Days per week: Not on file    Minutes per session: Not on file  . Stress: Not on file  Relationships  . Social connections:    Talks on phone: Not on file    Gets together: Not on file    Attends religious service: Not on file    Active member of club or organization: Not on file    Attends meetings of clubs or organizations: Not on file    Relationship status: Not on file  Other Topics Concern  . Not on file  Social History Narrative   Lives with wife, dog and cats    Occupation: retired, was self employed, now works at [employer]    Edu: HS   Activity: walks 1.5 mi daily   Diet: some water, fruits/vegetables daily    Outpatient  Encounter Medications as of 05/21/2018  Medication Sig  . albuterol (PROVENTIL HFA;VENTOLIN HFA) 108 (90 Base) MCG/ACT inhaler INHALE 2 PUFFS BY MOUTH EVERY 6 HOURS AS NEEDED FOR WHEEZING  .  aspirin 81 MG EC tablet Take 1 tablet (81 mg total) by mouth daily.  Marland Kitchen atenolol (TENORMIN) 25 MG tablet TAKE 1 TABLET BY MOUTH EVERY DAY  . atorvastatin (LIPITOR) 20 MG tablet TAKE 1 TABLET BY MOUTH EVERY DAY  . Blood Glucose Monitoring Suppl (BLOOD GLUCOSE MONITOR SYSTEM) w/Device KIT 1 each by Does not apply route daily. Use as directed to check blood sugar once daily  . Cyanocobalamin (VITAMIN B 12 PO) Take 1 tablet by mouth daily.  . diclofenac sodium (VOLTAREN) 1 % GEL APPLY 2 G TOPICALLY 3 (THREE) TIMES DAILY AS NEEDED (ANTI INFLAMMATORY).  Marland Kitchen gabapentin (NEURONTIN) 100 MG capsule TAKE 1 CAPSULE BY MOUTH AT BEDTIME ALONG WITH 300 MG TO EQUAL 400 MG  . gabapentin (NEURONTIN) 300 MG capsule TAKE 1 CAPSULE BY MOUTH TWICE A DAY - 1 IN THE MORNING AND 1 AT BEDTIME ALONG WITH 100 MG CAPSULE  . Glucose Blood (BLOOD GLUCOSE TEST STRIPS) STRP 1 each by In Vitro route daily. Use as directed to check blood sugar once daily  . ibuprofen (ADVIL,MOTRIN) 200 MG tablet Take 400-600 mg by mouth every 6 (six) hours as needed for mild pain (DEPENDS ON PAIN IF TAKES 400-600 MG).   . Lancets MISC 1 each by Does not apply route daily. Use as directed to check blood sugar daily  . losartan (COZAAR) 100 MG tablet TAKE 1 TABLET BY MOUTH EVERY DAY  . metFORMIN (GLUCOPHAGE) 1000 MG tablet TAKE 1 TABLET TWICE DAILY WITH A MEAL  . [DISCONTINUED] gabapentin (NEURONTIN) 100 MG capsule TAKE 1 CAPSULE BY MOUTH AT BEDTIME ALONG WITH 300 MG TO EQUAL 400 MG  . [DISCONTINUED] oseltamivir (TAMIFLU) 75 MG capsule Take 1 capsule (75 mg total) by mouth daily.   No facility-administered encounter medications on file as of 05/21/2018.     Activities of Daily Living In your present state of health, do you have any difficulty performing the  following activities: 05/21/2018  Hearing? N  Vision? N  Difficulty concentrating or making decisions? N  Walking or climbing stairs? N  Dressing or bathing? N  Doing errands, shopping? N  Preparing Food and eating ? N  Using the Toilet? N  In the past six months, have you accidently leaked urine? N  Do you have problems with loss of bowel control? N  Managing your Medications? N  Managing your Finances? N  Housekeeping or managing your Housekeeping? N  Some recent data might be hidden    Patient Care Team: Ria Bush, MD as PCP - General (Family Medicine) Minna Merritts, MD as Consulting Physician (Cardiology)   Assessment:   This is a routine wellness examination for Iren.  Vision Screening Comments: Last vision exam in Oct 2019; cataract surgery in Nov 2019  Exercise Activities and Dietary recommendations Current Exercise Habits: Home exercise routine, Type of exercise: walking, Time (Minutes): 45, Frequency (Times/Week): 7, Weekly Exercise (Minutes/Week): 315, Intensity: Mild, Exercise limited by: None identified  Goals    . Increase physical activity     Starting 05/13/2017, I will continue to walk 45-60 minutes daily.        Fall Risk Fall Risk  05/21/2018 11/01/2017 07/01/2017 05/13/2017 08/11/2014  Falls in the past year? 0 No No No No   Depression Screen PHQ 2/9 Scores 05/21/2018 11/01/2017 05/13/2017 03/27/2017  PHQ - 2 Score 0 0 0 0  PHQ- 9 Score 0 - 0 -    Cognitive Function MMSE - Mini Mental State Exam 05/21/2018 05/13/2017  Orientation  to time 5 5  Orientation to Place 5 5  Registration 3 3  Attention/ Calculation 0 0  Recall 2 2  Recall-comments unable to recall 1 of 3 words unable to recall 1 of 3 words  Language- name 2 objects 0 0  Language- repeat 1 1  Language- follow 3 step command 0 1  Language- follow 3 step command-comments - unable to follow 1 step of 3 step command  Language- read & follow direction 0 0  Write a sentence 0 0  Copy  design 0 0  Total score 16 17     PLEASE NOTE: A Mini-Cog screen was completed. Maximum score is 17. A value of 0 denotes this part of Folstein MMSE was not completed or the patient failed this part of the Mini-Cog screening.   Mini-Cog Screening Orientation to Time - Max 5 pts Orientation to Place - Max 5 pts Registration - Max 3 pts Recall - Max 3 pts Language Repeat - Max 1 pts    Immunization History  Administered Date(s) Administered  . Influenza, High Dose Seasonal PF 11/30/2013  . Influenza,inj,Quad PF,6+ Mos 10/15/2012, 09/30/2015, 09/06/2017  . Influenza,inj,quad, With Preservative 10/29/2016  . Influenza-Unspecified 11/01/2014, 10/13/2016  . Pneumococcal Conjugate-13 09/05/2015  . Pneumococcal Polysaccharide-23 01/30/2011, 10/13/2016, 10/18/2016, 04/07/2018  . Td 10/27/2015  . Zoster 09/15/2012  . Zoster Recombinat (Shingrix) 09/13/2017, 12/15/2017    Screening Tests Health Maintenance  Topic Date Due  . HEMOGLOBIN A1C  05/22/2018 (Originally 04/01/2018)  . DTaP/Tdap/Td (1 - Tdap) 10/26/2025 (Originally [birth year]01/1969)  . INFLUENZA VACCINE  08/30/2018  . FOOT EXAM  10/02/2018  . OPHTHALMOLOGY EXAM  11/03/2018  . COLONOSCOPY  08/08/2019  . TETANUS/TDAP  10/26/2025  . Hepatitis C Screening  Completed  . PNA vac Low Risk Adult  Completed       Plan:     I have personally reviewed, addressed, and noted the following in the patient's chart:  A. Medical and social history B. Use of alcohol, tobacco or illicit drugs  C. Current medications and supplements D. Functional ability and status E.  Nutritional status F.  Physical activity G. Advance directives H. List of other physicians I.  Hospitalizations, surgeries, and ER visits in previous 12 months J.  Vitals (unless it is a telemedicine encounter) K. Screenings to include hearing, vision, cognitive, depression L. Referrals and appointments   In addition, I have reviewed and discussed with patient certain preventive  protocols, quality metrics, and best practice recommendations. A written personalized care plan for preventive services and recommendations were provided to patient.  With patient's permission, we connected on 05/21/18 at 11:00 AM EDT by a video enabled telemedicine application. Two patient identifiers were used to ensure the encounter occurred with the correct person. .   Patient was in home and writer was in office.   Signed,   Lindell Noe, MHA, BS, LPN Health Coach

## 2018-05-22 ENCOUNTER — Ambulatory Visit: Payer: Managed Care, Other (non HMO)

## 2018-05-22 ENCOUNTER — Other Ambulatory Visit (INDEPENDENT_AMBULATORY_CARE_PROVIDER_SITE_OTHER): Payer: 59

## 2018-05-22 ENCOUNTER — Other Ambulatory Visit: Payer: Self-pay

## 2018-05-22 ENCOUNTER — Other Ambulatory Visit: Payer: Self-pay | Admitting: Family Medicine

## 2018-05-22 DIAGNOSIS — E118 Type 2 diabetes mellitus with unspecified complications: Secondary | ICD-10-CM

## 2018-05-22 DIAGNOSIS — E785 Hyperlipidemia, unspecified: Secondary | ICD-10-CM | POA: Diagnosis not present

## 2018-05-22 DIAGNOSIS — E1169 Type 2 diabetes mellitus with other specified complication: Secondary | ICD-10-CM

## 2018-05-22 DIAGNOSIS — R972 Elevated prostate specific antigen [PSA]: Secondary | ICD-10-CM

## 2018-05-22 LAB — LIPID PANEL
Cholesterol: 107 mg/dL (ref 0–200)
HDL: 31 mg/dL — ABNORMAL LOW (ref >39.00)
NonHDL: 76.44
Total CHOL/HDL Ratio: 3
Triglycerides: 251 mg/dL — ABNORMAL HIGH (ref 0.0–149.0)
VLDL: 50.2 mg/dL — ABNORMAL HIGH (ref 0.0–40.0)

## 2018-05-22 LAB — COMPREHENSIVE METABOLIC PANEL
ALT: 36 U/L (ref 0–53)
AST: 33 U/L (ref 0–37)
Albumin: 4.1 g/dL (ref 3.5–5.2)
Alkaline Phosphatase: 62 U/L (ref 39–117)
BUN: 17 mg/dL (ref 6–23)
CO2: 30 mEq/L (ref 19–32)
Calcium: 9.2 mg/dL (ref 8.4–10.5)
Chloride: 101 mEq/L (ref 96–112)
Creatinine, Ser: 0.8 mg/dL (ref 0.40–1.50)
GFR: 95.55 mL/min (ref >60.00)
Glucose, Bld: 109 mg/dL — ABNORMAL HIGH (ref 70–99)
Potassium: 5 mEq/L (ref 3.5–5.1)
Sodium: 137 mEq/L (ref 135–145)
Total Bilirubin: 0.7 mg/dL (ref 0.2–1.2)
Total Protein: 6.8 g/dL (ref 6.0–8.3)

## 2018-05-22 LAB — HEMOGLOBIN A1C: Hgb A1c MFr Bld: 6.5 % (ref 4.6–6.5)

## 2018-05-22 LAB — PSA: PSA: 4.49 ng/mL — ABNORMAL HIGH (ref 0.10–4.00)

## 2018-05-22 LAB — LDL CHOLESTEROL, DIRECT: Direct LDL: 44 mg/dL

## 2018-05-26 ENCOUNTER — Other Ambulatory Visit: Payer: Self-pay

## 2018-05-26 ENCOUNTER — Encounter: Payer: Self-pay | Admitting: Family Medicine

## 2018-05-26 ENCOUNTER — Ambulatory Visit (INDEPENDENT_AMBULATORY_CARE_PROVIDER_SITE_OTHER): Payer: 59 | Admitting: Family Medicine

## 2018-05-26 VITALS — BP 159/81 | HR 65 | Temp 97.1°F | Ht 68.5 in | Wt 241.0 lb

## 2018-05-26 DIAGNOSIS — E785 Hyperlipidemia, unspecified: Secondary | ICD-10-CM

## 2018-05-26 DIAGNOSIS — G4733 Obstructive sleep apnea (adult) (pediatric): Secondary | ICD-10-CM | POA: Diagnosis not present

## 2018-05-26 DIAGNOSIS — I25118 Atherosclerotic heart disease of native coronary artery with other forms of angina pectoris: Secondary | ICD-10-CM

## 2018-05-26 DIAGNOSIS — E118 Type 2 diabetes mellitus with unspecified complications: Secondary | ICD-10-CM | POA: Diagnosis not present

## 2018-05-26 DIAGNOSIS — I10 Essential (primary) hypertension: Secondary | ICD-10-CM | POA: Diagnosis not present

## 2018-05-26 DIAGNOSIS — E1169 Type 2 diabetes mellitus with other specified complication: Secondary | ICD-10-CM

## 2018-05-26 DIAGNOSIS — K760 Fatty (change of) liver, not elsewhere classified: Secondary | ICD-10-CM

## 2018-05-26 DIAGNOSIS — R972 Elevated prostate specific antigen [PSA]: Secondary | ICD-10-CM

## 2018-05-26 NOTE — Patient Instructions (Signed)
Call GI to check on rpt colonoscopy date Call to schedule f/u appt with urology.  Monitor BP readings and call me if persistently elevated.

## 2018-05-26 NOTE — Assessment & Plan Note (Signed)
Pt states normal biopsy 03/2017. PSA improved but remaining elevated. Encouraged uro f/u.

## 2018-05-26 NOTE — Assessment & Plan Note (Signed)
Chronic, stable. Continue current regimen. 

## 2018-05-26 NOTE — Assessment & Plan Note (Addendum)
Chronic, LDL at goal, trig too high. Continue lipitor. Encouraged healthy diet changes to help control chol levels. Tricor previously stopped due to transaminitis.  The ASCVD Risk score Mikey Bussing DC Jr., et al., 2013) failed to calculate for the following reasons:   The valid total cholesterol range is 130 to 320 mg/dL

## 2018-05-26 NOTE — Assessment & Plan Note (Addendum)
Compliant with CPAP. Appreciate neuro care.

## 2018-05-26 NOTE — Assessment & Plan Note (Signed)
Chronic, elevated today. Attributes to stress/upset over his long time dog he may have to put down today. Advised to monitor BP readings at home, let me know if consistently >140/90 to add 3rd antihypertensive agent. Pt agrees with plan.

## 2018-05-26 NOTE — Progress Notes (Signed)
Virtual visit completed through [employer]. Due to national recommendations of social distancing due to Aneta 19, a virtual visit is felt to be most appropriate for this patient at this time.   Patient location: home. Wife also present on call.  Provider location: Financial controller at [facility], office.  If any vitals were documented, they were collected by patient at home unless specified below.    BP (!) 159/81 (BP Location: Left Arm, Cuff Size: Normal)   Pulse 65   Temp (!) 97.1 F (36.2 C) (Oral)   Ht 5' 8.5" (1.74 m)   Wt 241 lb (109.3 kg)   BMI 36.11 kg/m    CC: AMW f/u visit Subjective:    Patient ID: Casey Golden, male    DOB: [birth year], 68 y.o.   MRN: [id number]  HPI: Casey Golden is a 68 y.o. male presenting on 05/26/2018 for Annual Exam (Pt 2. )   Saw Katha Cabal last week for medicare wellness visit. Note reviewed.   Saw cardiology 03/2018, note reviewed. Stable period.  OSA - followed by neurology, latest note reviewed. Stable period.   HTN - Compliant with current antihypertensive regimen of atenolol 44m and losartan 1032mdaily. Does not regularly check blood pressures at home. No low blood pressure readings or symptoms of dizziness/syncope. Denies HA, vision changes, CP/tightness, SOB, leg swelling.   DM - well controlled on metformin 100053mid. Glucose meter brand: one-touch. Does intermittently check sugars at home. Occasional paresthesias when sugars are trending up. [facility] exam[facility]   Preventative: COLONOSCOPY WITH PROPOFOL 04/17/2016 TAs, high grade dysplasia with margins clear, diverticulosis ([place]) COLONOSCOPY WITH PROPOFOL 08/07/2016 TAx1, diverticulosis[facility].  Prostate cancer screening - [place] [facility] watching elevated PSA. Pt states he had biopsy but doesn't remember results. Advised to call and schedule appt.  Lung cancer screening - does not meet  criteria  Flu shot - yearly Td 2017 Pneumovax 2013, prevnar 2017 zostavax - 08/2012 shingrix - completed 2019 Advanced directive discussion - scanned and in chart 12/2016. Does not want prolonged life support if terminal condition. No HCPOA form filled out. He will think about this - maybe daughter Casey SciaraSeat belt use discussed.  Sunscreen use discussed, no changing moles on skin.  Ex smoker quit 1992  Alcohol - none No bowel problem - good fiber in diet.  No voiding trouble.   Lives with wife, dog and cats  Occupation: retired, was self employed, now works at [employer]  Edu: HS Activity: walks 1.5 mi daily Diet: some water, fruits/vegetables daily     Relevant past medical, surgical, family and social history reviewed and updated as indicated. Interim medical history since our last visit reviewed. Allergies and medications reviewed and updated. Outpatient Medications Prior to Visit  Medication Sig Dispense Refill  . albuterol (PROVENTIL HFA;VENTOLIN HFA) 108 (90 Base) MCG/ACT inhaler INHALE 2 PUFFS BY MOUTH EVERY 6 HOURS AS NEEDED FOR WHEEZING 8.5 Inhaler 2  . aspirin 81 MG EC tablet Take 1 tablet (81 mg total) by mouth daily. 30 tablet 12  . atenolol (TENORMIN) 25 MG tablet TAKE 1 TABLET BY MOUTH EVERY DAY 90 tablet 3  . atorvastatin (LIPITOR) 20 MG tablet TAKE 1 TABLET BY MOUTH EVERY DAY 90 tablet 3  . Blood Glucose Monitoring Suppl (BLOOD GLUCOSE MONITOR SYSTEM) w/Device KIT 1 each by Does not apply route daily. Use as directed  to check blood sugar once daily 1 each 0  . Cyanocobalamin (VITAMIN B 12 PO) Take 1 tablet by mouth daily.    . diclofenac sodium (VOLTAREN) 1 % GEL APPLY 2 G TOPICALLY 3 (THREE) TIMES DAILY AS NEEDED (ANTI INFLAMMATORY). 100 g 1  . gabapentin (NEURONTIN) 100 MG capsule TAKE 1 CAPSULE BY MOUTH AT BEDTIME ALONG WITH 300 MG TO EQUAL 400 MG 90 capsule 3  . gabapentin (NEURONTIN) 300 MG capsule TAKE 1 CAPSULE BY MOUTH TWICE A DAY - 1 IN THE MORNING AND 1  AT BEDTIME ALONG WITH 100 MG CAPSULE 180 capsule 3  . Glucose Blood (BLOOD GLUCOSE TEST STRIPS) STRP 1 each by In Vitro route daily. Use as directed to check blood sugar once daily 100 each 0  . ibuprofen (ADVIL,MOTRIN) 200 MG tablet Take 400-600 mg by mouth every 6 (six) hours as needed for mild pain (DEPENDS ON PAIN IF TAKES 400-600 MG).     . Lancets MISC 1 each by Does not apply route daily. Use as directed to check blood sugar daily 100 each 0  . losartan (COZAAR) 100 MG tablet TAKE 1 TABLET BY MOUTH EVERY DAY 90 tablet 3  . metFORMIN (GLUCOPHAGE) 1000 MG tablet TAKE 1 TABLET TWICE DAILY WITH A MEAL 180 tablet 1   No facility-administered medications prior to visit.      Per HPI unless specifically indicated in ROS section below Review of Systems Objective:    BP (!) 159/81 (BP Location: Left Arm, Cuff Size: Normal)   Pulse 65   Temp (!) 97.1 F (36.2 C) (Oral)   Ht 5' 8.5" (1.74 m)   Wt 241 lb (109.3 kg)   BMI 36.11 kg/m   Wt Readings from Last 3 Encounters:  05/26/18 241 lb (109.3 kg)  04/02/18 245 lb 8 oz (111.4 kg)  12/19/17 246 lb (111.6 kg)     Physical exam: Gen: alert, NAD, not ill appearing Pulm: speaks in complete sentences without increased work of breathing Psych: normal mood, normal thought content      Results for orders placed or performed in visit on 05/22/18  Hemoglobin A1c  Result Value Ref Range   Hgb A1c MFr Bld 6.5 4.6 - 6.5 %  PSA  Result Value Ref Range   PSA 4.49 (H) 0.10 - 4.00 ng/mL  Comprehensive metabolic panel  Result Value Ref Range   Sodium 137 135 - 145 mEq/L   Potassium 5.0 3.5 - 5.1 mEq/L   Chloride 101 96 - 112 mEq/L   CO2 30 19 - 32 mEq/L   Glucose, Bld 109 (H) 70 - 99 mg/dL   BUN 17 6 - 23 mg/dL   Creatinine, Ser 0.80 0.40 - 1.50 mg/dL   Total Bilirubin 0.7 0.2 - 1.2 mg/dL   Alkaline Phosphatase 62 39 - 117 U/L   AST 33 0 - 37 U/L   ALT 36 0 - 53 U/L   Total Protein 6.8 6.0 - 8.3 g/dL   Albumin 4.1 3.5 - 5.2 g/dL    Calcium 9.2 8.4 - 10.5 mg/dL   GFR 95.55 >60.00 mL/min  Lipid panel  Result Value Ref Range   Cholesterol 107 0 - 200 mg/dL   Triglycerides 251.0 (H) 0.0 - 149.0 mg/dL   HDL 31.00 (L) >39.00 mg/dL   VLDL 50.2 (H) 0.0 - 40.0 mg/dL   Total CHOL/HDL Ratio 3    NonHDL 76.44   LDL cholesterol, direct  Result Value Ref Range   Direct LDL 44.0  mg/dL   Assessment & Plan:   Problem List Items Addressed This Visit    Severe obesity (BMI 35.0-39.9) with comorbidity ([place])    Encouraged healthy diet and lifestyle changes to affect sustainable weight loss.       OSA (obstructive sleep apnea)    Compliant with CPAP. Appreciate neuro care.       HTN (hypertension) - Primary    Chronic, elevated today. Attributes to stress/upset over his long time dog he may have to put down today. Advised to monitor BP readings at home, let me know if consistently >140/90 to add 3rd antihypertensive agent. Pt agrees with plan.       Fatty liver disease, nonalcoholic   Elevated PSA    Pt states normal biopsy 03/2017. PSA improved but remaining elevated. Encouraged uro f/u.       Dyslipidemia associated with type 2 diabetes mellitus (HCC)    Chronic, LDL at goal, trig too high. Continue lipitor. Encouraged healthy diet changes to help control chol levels. Tricor previously stopped due to transaminitis.  The ASCVD Risk score Mikey Bussing DC Jr., et al., 2013) failed to calculate for the following reasons:   The valid total cholesterol range is 130 to 320 mg/dL       Controlled diabetes mellitus type 2 with complications (HCC)    Chronic, stable. Continue current regimen.           No orders of the defined types were placed in this encounter.  No orders of the defined types were placed in this encounter.  Patient Instructions  Call GI to check on rpt colonoscopy date Call to schedule f/u appt with urology.  Monitor BP readings and call me if persistently elevated.   Follow up plan: Return in about 6  months (around 11/25/2018) for follow up visit.  Ria Bush, MD

## 2018-05-26 NOTE — Assessment & Plan Note (Signed)
Encouraged healthy diet and lifestyle changes to affect sustainable weight loss.  

## 2018-06-12 ENCOUNTER — Emergency Department
Admission: EM | Admit: 2018-06-12 | Discharge: 2018-06-12 | Disposition: A | Discharge status: Home / Self Care | Payer: 59 | Attending: Emergency Medicine | Admitting: Emergency Medicine

## 2018-06-12 ENCOUNTER — Other Ambulatory Visit: Payer: Self-pay

## 2018-06-12 DIAGNOSIS — I2510 Atherosclerotic heart disease of native coronary artery without angina pectoris: Secondary | ICD-10-CM | POA: Insufficient documentation

## 2018-06-12 DIAGNOSIS — Z23 Encounter for immunization: Secondary | ICD-10-CM | POA: Insufficient documentation

## 2018-06-12 DIAGNOSIS — Y929 Unspecified place or not applicable: Secondary | ICD-10-CM | POA: Insufficient documentation

## 2018-06-12 DIAGNOSIS — S61217A Laceration without foreign body of left little finger without damage to nail, initial encounter: Secondary | ICD-10-CM | POA: Diagnosis not present

## 2018-06-12 DIAGNOSIS — I10 Essential (primary) hypertension: Secondary | ICD-10-CM | POA: Diagnosis not present

## 2018-06-12 DIAGNOSIS — Z7984 Long term (current) use of oral hypoglycemic drugs: Secondary | ICD-10-CM | POA: Diagnosis not present

## 2018-06-12 DIAGNOSIS — Z7982 Long term (current) use of aspirin: Secondary | ICD-10-CM | POA: Diagnosis not present

## 2018-06-12 DIAGNOSIS — Z87891 Personal history of nicotine dependence: Secondary | ICD-10-CM | POA: Insufficient documentation

## 2018-06-12 DIAGNOSIS — Y939 Activity, unspecified: Secondary | ICD-10-CM | POA: Diagnosis not present

## 2018-06-12 DIAGNOSIS — W269XXA Contact with unspecified sharp object(s), initial encounter: Secondary | ICD-10-CM | POA: Diagnosis not present

## 2018-06-12 DIAGNOSIS — Z79899 Other long term (current) drug therapy: Secondary | ICD-10-CM | POA: Diagnosis not present

## 2018-06-12 DIAGNOSIS — S6992XA Unspecified injury of left wrist, hand and finger(s), initial encounter: Secondary | ICD-10-CM | POA: Diagnosis present

## 2018-06-12 DIAGNOSIS — E119 Type 2 diabetes mellitus without complications: Secondary | ICD-10-CM | POA: Insufficient documentation

## 2018-06-12 DIAGNOSIS — Y990 Civilian activity done for income or pay: Secondary | ICD-10-CM | POA: Diagnosis not present

## 2018-06-12 MED ORDER — CEPHALEXIN 500 MG PO CAPS: 500.0000 mg | ORAL_CAPSULE | Freq: Four times a day (QID) | ORAL | 0 refills | Status: AC

## 2018-06-12 MED ORDER — TETANUS-DIPHTH-ACELL PERTUSSIS 5-2.5-18.5 LF-MCG/0.5 IM SUSP
0.5000 mL | Freq: Once | INTRAMUSCULAR | Status: AC
  Administered 2018-06-12: 0.5 mL via INTRAMUSCULAR
  Filled 2018-06-12: qty 0.5

## 2018-06-12 MED ORDER — CEPHALEXIN 500 MG PO CAPS
500.0000 mg | ORAL_CAPSULE | Freq: Once | ORAL | Status: AC
Start: 2018-06-12 — End: 2018-06-12
  Administered 2018-06-12: 23:00:00 500 mg via ORAL

## 2018-06-12 MED ORDER — LIDOCAINE HCL (PF) 1 % IJ SOLN
5.0000 mL | Freq: Once | INTRAMUSCULAR | Status: AC
  Administered 2018-06-12: 5 mL via INTRADERMAL
  Filled 2018-06-12: qty 5

## 2018-06-12 NOTE — ED Triage Notes (Signed)
Patient c/o laceration to 5th digit left hand from a range at work.

## 2018-06-12 NOTE — ED Provider Notes (Signed)
[facility] Emergency Department Provider Note  ____________________________________________  Time seen: Approximately 11:12 PM  I have reviewed the triage vital signs and the nursing notes.   HISTORY  Chief Complaint Laceration    HPI Casey Golden  is a 68 y.o. male that presents to the emergency department for evaluation of finger laceration at work from a piece of equipment.  He had difficulty to stop the bleeding so came to the emergency department.  He takes an aspirin daily.  Past Medical History:  Diagnosis Date  . Arthritis   . CAD (coronary artery disease)    Cardiologist--  Dr. Ida Rogue  . Cataract 2019   bilateral; resolved with surgery  . Colonic polyp   . Fatty liver disease, nonalcoholic 4098   by [place]  . GERD (gastroesophageal reflux disease)   . History of chicken pox   . Hyperlipidemia   . Hypertension   . Nocturia   . OSA (obstructive sleep apnea)    per pt no cpap due to sleep center/ insurance issue-- study done 2014  . Rash of genital area    09-08-2014  per pt Dr Junious Silk aware  . Seasonal and perennial allergic rhinitis   . Type 2 diabetes mellitus ([place])   . Wears dentures    full upper/  partial lower  . Wears glasses     Patient Active Problem List   Diagnosis Date Noted  . Osteoarthritis of knee 05/23/2017  . Cerumen impaction 05/20/2017  . Ex-smoker 03/27/2017  . Elevated PSA 03/27/2017  . Personal history of colonic polyps   . Stress due to family tension 05/15/2016  . Skin rash 05/15/2016  . Hx of colonic polyps   . Benign neoplasm of transverse colon   . Polyp of sigmoid colon   . Benign neoplasm of descending colon   . Chronic midline low back pain 02/27/2016  . Bilateral hip pain 11/02/2015  . Right knee pain 07/12/2015  . Health maintenance examination 03/07/2015  . Severe obesity (BMI 35.0-39.9) with comorbidity ([place]) 03/07/2015  . Advanced care planning/counseling discussion 03/07/2015  .  Controlled diabetes mellitus type 2 with complications ([place]) 11/91/4782  . Dyslipidemia associated with type 2 diabetes mellitus ([place]) 05/06/2014  . OSA (obstructive sleep apnea) 05/06/2014  . Fatty liver disease, nonalcoholic 95/62/1308  . H/O malignant neoplasm of skin 01/01/2013  . HTN (hypertension) 02/15/2009  . CAD (coronary artery disease), native coronary artery 02/15/2009  . Shortness of breath 02/15/2009    Past Surgical History:  Procedure Laterality Date  . ABDOMINAL HERNIA REPAIR  2007      [facility]   open repair  . APPENDECTOMY  age 62  . CARDIAC CATHETERIZATION  12-23-2007   [facility]   Abnormal myoview w/ ischemia/  40% mRCA with nonobstructive and no sig. plaque in his left system, EF 55%  . CARDIAC CATHETERIZATION  Apr 2008    [facility]   Abnormal myoview/  50% RCA,  ef 65%  . CARDIAC CATHETERIZATION  1999      [facility]  . CATARACT EXTRACTION, BILATERAL Bilateral 11/2017  . CERVICAL FUSION  1992  . CHOLECYSTECTOMY OPEN  2006  . COLONOSCOPY WITH PROPOFOL N/A 04/17/2016   TAs, high grade dysplasia with margins clear, diverticulosis Lucilla Lame, MD)  . COLONOSCOPY WITH PROPOFOL N/A 08/07/2016   TAx1, diverticulosis, rpt 3 yrs ([place])  . DENTAL SURGERY     metal dental implant L mandible  . EXCISION OF SKIN TAG Right 09/14/2014   Procedure: EXCISION  OF SKIN TAG;  Surgeon: Festus Aloe, MD;  Location: [facility];  Service: Urology;  Laterality: Right;  . HYDROCELE EXCISION Left 09/14/2014   Procedure: LEFT HYDROCELECTOMY ADULT;  Surgeon: Festus Aloe, MD;  Location: [facility];  Service: Urology;  Laterality: Left;  . MOHS SURGERY  2015   skin cancer  . TONSILLECTOMY  age 46    Prior to Admission medications   Medication Sig Start Date End Date Taking? Authorizing Provider  albuterol (PROVENTIL HFA;VENTOLIN HFA) 108 (90 Base) MCG/ACT inhaler INHALE 2 PUFFS BY MOUTH EVERY 6 HOURS AS NEEDED FOR WHEEZING 03/27/18   Ria Bush, MD  aspirin 81  MG EC tablet Take 1 tablet (81 mg total) by mouth daily. 09/17/14   Festus Aloe, MD  atenolol (TENORMIN) 25 MG tablet TAKE 1 TABLET BY MOUTH EVERY DAY 09/23/17   Minna Merritts, MD  atorvastatin (LIPITOR) 20 MG tablet TAKE 1 TABLET BY MOUTH EVERY DAY 04/09/18   Minna Merritts, MD  Blood Glucose Monitoring Suppl (BLOOD GLUCOSE MONITOR SYSTEM) w/Device KIT 1 each by Does not apply route daily. Use as directed to check blood sugar once daily 07/24/17   Ria Bush, MD  cephALEXin (KEFLEX) 500 MG capsule Take 1 capsule (500 mg total) by mouth 4 (four) times daily for 10 days. 06/12/18 06/22/18  Laban Emperor, PA-C  Cyanocobalamin (VITAMIN B 12 PO) Take 1 tablet by mouth daily.    [provider]  diclofenac sodium (VOLTAREN) 1 % GEL APPLY 2 G TOPICALLY 3 (THREE) TIMES DAILY AS NEEDED (ANTI INFLAMMATORY). 07/24/17   Ria Bush, MD  gabapentin (NEURONTIN) 100 MG capsule TAKE 1 CAPSULE BY MOUTH AT BEDTIME ALONG WITH 300 MG TO EQUAL 400 MG 12/24/17   Hyatt, Max T, DPM  gabapentin (NEURONTIN) 300 MG capsule TAKE 1 CAPSULE BY MOUTH TWICE A DAY - 1 IN THE MORNING AND 1 AT BEDTIME ALONG WITH 100 MG CAPSULE 01/17/18   Hyatt, Max T, DPM  Glucose Blood (BLOOD GLUCOSE TEST STRIPS) STRP 1 each by In Vitro route daily. Use as directed to check blood sugar once daily 07/24/17   Ria Bush, MD  ibuprofen (ADVIL,MOTRIN) 200 MG tablet Take 400-600 mg by mouth every 6 (six) hours as needed for mild pain (DEPENDS ON PAIN IF TAKES 400-600 MG).     [provider]  Lancets MISC 1 each by Does not apply route daily. Use as directed to check blood sugar daily 07/24/17   Ria Bush, MD  losartan (COZAAR) 100 MG tablet TAKE 1 TABLET BY MOUTH EVERY DAY 03/11/18   Minna Merritts, MD  metFORMIN (GLUCOPHAGE) 1000 MG tablet TAKE 1 TABLET TWICE DAILY WITH A MEAL 10/16/17   Ria Bush, MD    Allergies Sulfa antibiotics  Family History  Problem Relation Age of Onset  . CAD  Mother        MI  . Diabetes Mother   . Stroke Mother        mini-stroke  . Cancer Father 41       lung (smoker)  . Heart disease Paternal Grandmother   . Heart disease Paternal Grandfather   . Diabetes Paternal Grandfather   . Cancer Sister        lung  . Coronary artery disease Neg Hx        Premature    Social History Social History   Tobacco Use  . Smoking status: Former Smoker    Packs/day: 2.00    Years:  35.00    Pack years: 70.00    Types: Cigarettes    Last attempt to quit: 01/29/1990    Years since quitting: 28.3  . Smokeless tobacco: Never Used  Substance Use Topics  . Alcohol use: No  . Drug use: No     Review of Systems  Respiratory: No SOB. Gastrointestinal: No nausea, no vomiting.  Musculoskeletal: Positive for finger pain.   Skin: Negative for rash,  ecchymosis.  Positive for laceration. Neurological: Negative for numbness or tingling   ____________________________________________   PHYSICAL EXAM:  VITAL SIGNS: ED Triage Vitals  Enc Vitals Group     BP 06/12/18 2123 (!) 161/82     Pulse Rate 06/12/18 2123 81     Resp 06/12/18 2123 17     Temp 06/12/18 2123 98.1 F (36.7 C)     Temp src --      SpO2 06/12/18 2123 97 %     Weight 06/12/18 2124 242 lb 8.1 oz (110 kg)     Height 06/12/18 2124 6' (1.829 m)     Head Circumference --      Peak Flow --      Pain Score 06/12/18 2124 1     Pain Loc --      Pain Edu? --      Excl. in [place]? --      Constitutional: Alert and oriented. Well appearing and in no acute distress. Eyes: Conjunctivae are normal. PERRL. EOMI. Head: Atraumatic. ENT:      Ears:      Nose: No congestion/rhinnorhea.      Mouth/Throat: Mucous membranes are moist.  Neck: No stridor.  Cardiovascular: Normal rate, regular rhythm.  Good peripheral circulation. Respiratory: Normal respiratory effort without tachypnea or retractions. Lungs CTAB. Good air entry to the bases with no decreased or absent breath  sounds. Musculoskeletal: Full range of motion to all extremities. No gross deformities appreciated. Neurologic:  Normal speech and language. No gross focal neurologic deficits are appreciated.  Skin:  Skin is warm, dry.  1 cm flap laceration to distal left little finger that is actively oozing blood. Psychiatric: Mood and affect are normal. Speech and behavior are normal. Patient exhibits appropriate insight and judgement.   ____________________________________________   LABS (all labs ordered are listed, but only abnormal results are displayed)  Labs Reviewed - No data to display ____________________________________________  EKG   ____________________________________________  RADIOLOGY   No results found.  ____________________________________________    PROCEDURES  Procedure(s) performed:    Procedures  LACERATION REPAIR Performed by: Laban Emperor  Consent: Verbal consent obtained.  Consent given by: patient  Prepped and Draped in normal sterile fashion  Wound explored: No foreign bodies   Laceration Location: little finger  Laceration Length: 1 cm  Anesthesia: None  Local anesthetic: lidocaine 1% without epinephrine  Anesthetic total: 4 ml  Irrigation method: syringe  Amount of cleaning: 59m normal saline  Skin closure: 5-0 nylon  Number of sutures: 2  Technique: Simple interrupted  Patient tolerance: Patient tolerated the procedure well with no immediate complications.  Medications  cephALEXin (KEFLEX) capsule 500 mg (has no administration in time range)  Tdap (BOOSTRIX) injection 0.5 mL (0.5 mLs Intramuscular Given 06/12/18 2235)  lidocaine (PF) (XYLOCAINE) 1 % injection 5 mL (5 mLs Intradermal Given by Other 06/12/18 2237)     ____________________________________________   INITIAL IMPRESSION / ASSESSMENT AND PLAN / ED COURSE  Pertinent labs & imaging results that were available during my care of the patient  were reviewed by me and  considered in my medical decision making (see chart for details).  Review of the [place] CSRS was performed in accordance of the [place] prior to dispensing any controlled drugs.     Patient presented to the emergency department for evaluation of finger injury from work.  Vital signs and exam are reassuring.  Laceration was repaired with 2 stitches to stop the bleeding.  Tetanus shot was updated.  Dose of Keflex was given.  Patient will be discharged home with prescriptions for Keflex. Patient is to follow up with primary care as directed. Patient is given ED precautions to return to the ED for any worsening or new symptoms.     ____________________________________________  FINAL CLINICAL IMPRESSION(S) / ED DIAGNOSES  Final diagnoses:  Laceration of left little finger without foreign body without damage to nail, initial encounter      NEW MEDICATIONS STARTED DURING THIS VISIT:  ED Discharge Orders         Ordered    cephALEXin (KEFLEX) 500 MG capsule  4 times daily     06/12/18 2309              This chart was dictated using voice recognition software/Dragon. Despite best efforts to proofread, errors can occur which can change the meaning. Any change was purely unintentional.    Laban Emperor, PA-C 06/12/18 2316    Earleen Newport, MD 06/16/18 [phone]

## 2018-06-17 ENCOUNTER — Encounter: Payer: Self-pay | Admitting: Family Medicine

## 2018-06-17 ENCOUNTER — Ambulatory Visit (INDEPENDENT_AMBULATORY_CARE_PROVIDER_SITE_OTHER): Payer: 59 | Admitting: Family Medicine

## 2018-06-17 ENCOUNTER — Other Ambulatory Visit: Payer: Self-pay

## 2018-06-17 VITALS — BP 122/64 | HR 75 | Temp 98.2°F | Ht 68.5 in | Wt 244.1 lb

## 2018-06-17 DIAGNOSIS — G8929 Other chronic pain: Secondary | ICD-10-CM | POA: Diagnosis not present

## 2018-06-17 DIAGNOSIS — M545 Low back pain, unspecified: Secondary | ICD-10-CM

## 2018-06-17 DIAGNOSIS — S61217A Laceration without foreign body of left little finger without damage to nail, initial encounter: Secondary | ICD-10-CM | POA: Insufficient documentation

## 2018-06-17 DIAGNOSIS — I25118 Atherosclerotic heart disease of native coronary artery with other forms of angina pectoris: Secondary | ICD-10-CM

## 2018-06-17 NOTE — Assessment & Plan Note (Signed)
Overall healing wound, there is not good approximation when DIP flexed - I have re-dressed wound in full extension with tube gauze, rec keep bandage on until seen in office in 3 days for suture renoval. Pt agrees with plan. Finish keflex.

## 2018-06-17 NOTE — Patient Instructions (Signed)
Keep dressing in place for the next 2 days. Return on Friday 8am for suture removal, sooner if any concerns.  We will refer you to neurosurgery for second opinion on back.

## 2018-06-17 NOTE — Progress Notes (Signed)
This visit was conducted in person.  BP 122/64 (BP Location: Left Arm, Patient Position: Sitting, Cuff Size: Normal)   Pulse 75   Temp 98.2 F (36.8 C) (Oral)   Ht 5' 8.5" (1.74 m)   Wt 244 lb 2 oz (110.7 kg)   SpO2 97%   BMI 36.58 kg/m    CC: suture check  Subjective:    Patient ID: Casey Golden, male    DOB: 23-Mar-1950, 68 y.o.   MRN: [id number]  HPI: Casey Golden is a 68 y.o. male presenting on 06/17/2018 for Hospitalization Follow-up (Seen at [facility] ED on 06/12/18. Has laceration to left 5th finger. ) and Skin Lesion (Pt noticed a new spot on posterior right lower leg. )   DOI: 5/14 PM   ER visit 06/12/2018 with laceration to L 5th finger sustained at work, note reviewed. Had 2 simple sutures placed with 5-0 nylon. Placed on keflex preventatively QID - he's been using max TID, received Tdap booster.   He has been treating with antibiotic ointment.  Worried stitches have come out - wound is opening and bleeding some through the bandage. Used peroxide to clean wound yesterday.   Ongoing lower back pain has seen ortho who recommended extensive surgery pt hesitant. Requests second opinion. MRI from 2017 in chart.      Relevant past medical, surgical, family and social history reviewed and updated as indicated. Interim medical history since our last visit reviewed. Allergies and medications reviewed and updated. Outpatient Medications Prior to Visit  Medication Sig Dispense Refill  . albuterol (PROVENTIL HFA;VENTOLIN HFA) 108 (90 Base) MCG/ACT inhaler INHALE 2 PUFFS BY MOUTH EVERY 6 HOURS AS NEEDED FOR WHEEZING 8.5 Inhaler 2  . aspirin 81 MG EC tablet Take 1 tablet (81 mg total) by mouth daily. 30 tablet 12  . atenolol (TENORMIN) 25 MG tablet TAKE 1 TABLET BY MOUTH EVERY DAY 90 tablet 3  . atorvastatin (LIPITOR) 20 MG tablet TAKE 1 TABLET BY MOUTH EVERY DAY 90 tablet 3  . Blood Glucose Monitoring Suppl (BLOOD GLUCOSE MONITOR SYSTEM) w/Device KIT 1 each by Does not apply  route daily. Use as directed to check blood sugar once daily 1 each 0  . cephALEXin (KEFLEX) 500 MG capsule Take 1 capsule (500 mg total) by mouth 4 (four) times daily for 10 days. 40 capsule 0  . Cyanocobalamin (VITAMIN B 12 PO) Take 1 tablet by mouth daily.    . diclofenac sodium (VOLTAREN) 1 % GEL APPLY 2 G TOPICALLY 3 (THREE) TIMES DAILY AS NEEDED (ANTI INFLAMMATORY). 100 g 1  . gabapentin (NEURONTIN) 100 MG capsule TAKE 1 CAPSULE BY MOUTH AT BEDTIME ALONG WITH 300 MG TO EQUAL 400 MG 90 capsule 3  . gabapentin (NEURONTIN) 300 MG capsule TAKE 1 CAPSULE BY MOUTH TWICE A DAY - 1 IN THE MORNING AND 1 AT BEDTIME ALONG WITH 100 MG CAPSULE 180 capsule 3  . Glucose Blood (BLOOD GLUCOSE TEST STRIPS) STRP 1 each by In Vitro route daily. Use as directed to check blood sugar once daily 100 each 0  . ibuprofen (ADVIL,MOTRIN) 200 MG tablet Take 400-600 mg by mouth every 6 (six) hours as needed for mild pain (DEPENDS ON PAIN IF TAKES 400-600 MG).     . Lancets MISC 1 each by Does not apply route daily. Use as directed to check blood sugar daily 100 each 0  . losartan (COZAAR) 100 MG tablet TAKE 1 TABLET BY MOUTH EVERY DAY 90 tablet 3  .  metFORMIN (GLUCOPHAGE) 1000 MG tablet TAKE 1 TABLET TWICE DAILY WITH A MEAL 180 tablet 1   No facility-administered medications prior to visit.      Per HPI unless specifically indicated in ROS section below Review of Systems Objective:    BP 122/64 (BP Location: Left Arm, Patient Position: Sitting, Cuff Size: Normal)   Pulse 75   Temp 98.2 F (36.8 C) (Oral)   Ht 5' 8.5" (1.74 m)   Wt 244 lb 2 oz (110.7 kg)   SpO2 97%   BMI 36.58 kg/m   Wt Readings from Last 3 Encounters:  06/17/18 244 lb 2 oz (110.7 kg)  06/12/18 242 lb 8.1 oz (110 kg)  05/26/18 241 lb (109.3 kg)    Physical Exam Vitals signs and nursing note reviewed.  Constitutional:      Appearance: Normal appearance. He is not ill-appearing.  Musculoskeletal: Normal range of motion.        General: No  swelling or tenderness.  Skin:    General: Skin is warm and dry.     Findings: No erythema.     Comments: Left 5th finger at distal tip with c shaped laceration with 2 sutures in place, without surrounding erythema or drainage. When he flexes finger skin not well approximated at mid incision   Neurological:     Mental Status: He is alert.       Assessment & Plan:   Problem List Items Addressed This Visit    Laceration of left little finger without foreign body without damage to nail - Primary    Overall healing wound, there is not good approximation when DIP flexed - I have re-dressed wound in full extension with tube gauze, rec keep bandage on until seen in office in 3 days for suture renoval. Pt agrees with plan. Finish keflex.       Chronic midline low back pain    Known severe spinal stenosis. Has seen ortho who recommended extensive surgery. Requests second opinion - will refer to local neurosurgery. Has not had steroid injections.       Relevant Orders   Ambulatory referral to Neurosurgery       No orders of the defined types were placed in this encounter.  Orders Placed This Encounter  Procedures  . Ambulatory referral to Neurosurgery    Referral Priority:   Routine    Referral Type:   Surgical    Referral Reason:   Specialty Services Required    Requested Specialty:   Neurosurgery    Number of Visits Requested:   1    Follow up plan: No follow-ups on file.  Ria Bush, MD

## 2018-06-17 NOTE — Assessment & Plan Note (Signed)
Known severe spinal stenosis. Has seen ortho who recommended extensive surgery. Requests second opinion - will refer to local neurosurgery. Has not had steroid injections.

## 2018-06-20 ENCOUNTER — Other Ambulatory Visit: Payer: Self-pay

## 2018-06-20 ENCOUNTER — Ambulatory Visit (INDEPENDENT_AMBULATORY_CARE_PROVIDER_SITE_OTHER): Payer: 59 | Admitting: Family Medicine

## 2018-06-20 ENCOUNTER — Encounter: Payer: Self-pay | Admitting: Family Medicine

## 2018-06-20 VITALS — BP 118/68 | HR 58 | Temp 98.2°F | Ht 68.5 in | Wt 245.0 lb

## 2018-06-20 DIAGNOSIS — I25118 Atherosclerotic heart disease of native coronary artery with other forms of angina pectoris: Secondary | ICD-10-CM

## 2018-06-20 DIAGNOSIS — S61217D Laceration without foreign body of left little finger without damage to nail, subsequent encounter: Secondary | ICD-10-CM | POA: Diagnosis not present

## 2018-06-20 NOTE — Assessment & Plan Note (Signed)
2 simple sutures removed pt tolerated well. Steri strips applied with instructions on home care.  Dressed finger with tube gauze. Provided with finger splint to use when he removes tube gauze.  Red flags to return for further eval reviewed.  Pt agrees with plan.

## 2018-06-20 NOTE — Progress Notes (Signed)
This visit was conducted in person.  BP 118/68 (BP Location: Left Arm, Patient Position: Sitting, Cuff Size: Normal)   Pulse (!) 58   Temp 98.2 F (36.8 C) (Oral)   Ht 5' 8.5" (1.74 m)   Wt 245 lb (111.1 kg)   SpO2 96%   BMI 36.71 kg/m    CC: suture removal Subjective:    Patient ID: Casey Golden, male    DOB: 1950-03-16, 68 y.o.   MRN: [id number]  HPI: Casey Golden is a 68 y.o. male presenting on 06/20/2018 for Suture / Staple Removal    See prior note for details. 2 sutures placed 06/12/2018.  Here for suture removal.  L distal pinky finger laceration sustained at work.        Relevant past medical, surgical, family and social history reviewed and updated as indicated. Interim medical history since our last visit reviewed. Allergies and medications reviewed and updated. Outpatient Medications Prior to Visit  Medication Sig Dispense Refill  . albuterol (PROVENTIL HFA;VENTOLIN HFA) 108 (90 Base) MCG/ACT inhaler INHALE 2 PUFFS BY MOUTH EVERY 6 HOURS AS NEEDED FOR WHEEZING 8.5 Inhaler 2  . aspirin 81 MG EC tablet Take 1 tablet (81 mg total) by mouth daily. 30 tablet 12  . atenolol (TENORMIN) 25 MG tablet TAKE 1 TABLET BY MOUTH EVERY DAY 90 tablet 3  . atorvastatin (LIPITOR) 20 MG tablet TAKE 1 TABLET BY MOUTH EVERY DAY 90 tablet 3  . Blood Glucose Monitoring Suppl (BLOOD GLUCOSE MONITOR SYSTEM) w/Device KIT 1 each by Does not apply route daily. Use as directed to check blood sugar once daily 1 each 0  . cephALEXin (KEFLEX) 500 MG capsule Take 1 capsule (500 mg total) by mouth 4 (four) times daily for 10 days. 40 capsule 0  . Cyanocobalamin (VITAMIN B 12 PO) Take 1 tablet by mouth daily.    . diclofenac sodium (VOLTAREN) 1 % GEL APPLY 2 G TOPICALLY 3 (THREE) TIMES DAILY AS NEEDED (ANTI INFLAMMATORY). 100 g 1  . gabapentin (NEURONTIN) 100 MG capsule TAKE 1 CAPSULE BY MOUTH AT BEDTIME ALONG WITH 300 MG TO EQUAL 400 MG 90 capsule 3  . gabapentin (NEURONTIN) 300 MG capsule  TAKE 1 CAPSULE BY MOUTH TWICE A DAY - 1 IN THE MORNING AND 1 AT BEDTIME ALONG WITH 100 MG CAPSULE 180 capsule 3  . Glucose Blood (BLOOD GLUCOSE TEST STRIPS) STRP 1 each by In Vitro route daily. Use as directed to check blood sugar once daily 100 each 0  . ibuprofen (ADVIL,MOTRIN) 200 MG tablet Take 400-600 mg by mouth every 6 (six) hours as needed for mild pain (DEPENDS ON PAIN IF TAKES 400-600 MG).     . Lancets MISC 1 each by Does not apply route daily. Use as directed to check blood sugar daily 100 each 0  . losartan (COZAAR) 100 MG tablet TAKE 1 TABLET BY MOUTH EVERY DAY 90 tablet 3  . metFORMIN (GLUCOPHAGE) 1000 MG tablet TAKE 1 TABLET TWICE DAILY WITH A MEAL 180 tablet 1   No facility-administered medications prior to visit.      Per HPI unless specifically indicated in ROS section below Review of Systems Objective:    BP 118/68 (BP Location: Left Arm, Patient Position: Sitting, Cuff Size: Normal)   Pulse (!) 58   Temp 98.2 F (36.8 C) (Oral)   Ht 5' 8.5" (1.74 m)   Wt 245 lb (111.1 kg)   SpO2 96%   BMI 36.71 kg/m   Wt  Readings from Last 3 Encounters:  06/20/18 245 lb (111.1 kg)  06/17/18 244 lb 2 oz (110.7 kg)  06/12/18 242 lb 8.1 oz (110 kg)    Physical Exam Vitals signs and nursing note reviewed.  Musculoskeletal: Normal range of motion.        General: No swelling or tenderness.     Comments: Laceration to L 5th distal finger, edges well approximated.         Assessment & Plan:   Problem List Items Addressed This Visit    Laceration of left little finger without foreign body without damage to nail - Primary    2 simple sutures removed pt tolerated well. Steri strips applied with instructions on home care.  Dressed finger with tube gauze. Provided with finger splint to use when he removes tube gauze.  Red flags to return for further eval reviewed.  Pt agrees with plan.           No orders of the defined types were placed in this encounter.  No orders of  the defined types were placed in this encounter.   Follow up plan: No follow-ups on file.  Ria Bush, MD

## 2018-06-25 ENCOUNTER — Ambulatory Visit: Payer: 59 | Attending: Student

## 2018-06-25 ENCOUNTER — Other Ambulatory Visit: Payer: Self-pay

## 2018-06-25 DIAGNOSIS — M545 Low back pain, unspecified: Secondary | ICD-10-CM

## 2018-06-25 DIAGNOSIS — M6281 Muscle weakness (generalized): Secondary | ICD-10-CM | POA: Diagnosis present

## 2018-06-25 DIAGNOSIS — G8929 Other chronic pain: Secondary | ICD-10-CM

## 2018-06-26 NOTE — Therapy (Signed)
[facility] [address], [place], [address] Phone: [phone]   Fax:  [phone]  Physical Therapy Evaluation  Patient Details  Name: Casey Golden MRN: [id number] Date of Birth: 1950-06-08 Referring Provider (PT): Marin Olp PA   Encounter Date: 06/25/2018  PT End of Session - 06/26/18 0823    Visit Number  1    Number of Visits  13    Date for PT Re-Evaluation  08/06/18    Authorization Type  1 / 10 G Code    PT Start Time  0800    PT Stop Time  0900    PT Time Calculation (min)  60 min    Activity Tolerance  Patient tolerated treatment well    Behavior During Therapy  [facility] for tasks assessed/performed       Past Medical History:  Diagnosis Date  . Arthritis   . CAD (coronary artery disease)    Cardiologist--  Dr. Ida Rogue  . Cataract 2019   bilateral; resolved with surgery  . Colonic polyp   . Fatty liver disease, nonalcoholic 3976   by [place]  . GERD (gastroesophageal reflux disease)   . History of chicken pox   . Hyperlipidemia   . Hypertension   . Nocturia   . OSA (obstructive sleep apnea)    per pt no cpap due to sleep center/ insurance issue-- study done 2014  . Rash of genital area    09-08-2014  per pt Dr Junious Silk aware  . Seasonal and perennial allergic rhinitis   . Type 2 diabetes mellitus ([place])   . Wears dentures    full upper/  partial lower  . Wears glasses     Past Surgical History:  Procedure Laterality Date  . ABDOMINAL HERNIA REPAIR  2007      [facility]   open repair  . APPENDECTOMY  age 24  . CARDIAC CATHETERIZATION  12-23-2007   [facility]   Abnormal myoview w/ ischemia/  40% mRCA with nonobstructive and no sig. plaque in his left system, EF 55%  . CARDIAC CATHETERIZATION  Apr 2008    [facility]   Abnormal myoview/  50% RCA,  ef 65%  . CARDIAC CATHETERIZATION  1999      [facility]  . CATARACT EXTRACTION, BILATERAL Bilateral 11/2017  . CERVICAL FUSION  1992  .  CHOLECYSTECTOMY OPEN  2006  . COLONOSCOPY WITH PROPOFOL N/A 04/17/2016   TAs, high grade dysplasia with margins clear, diverticulosis Lucilla Lame, MD)  . COLONOSCOPY WITH PROPOFOL N/A 08/07/2016   TAx1, diverticulosis, rpt 3 yrs ([facility])  . DENTAL SURGERY     metal dental implant L mandible  . EXCISION OF SKIN TAG Right 09/14/2014   Procedure: EXCISION OF SKIN TAG;  Surgeon: Festus Aloe, MD;  Location: [facility];  Service: Urology;  Laterality: Right;  . HYDROCELE EXCISION Left 09/14/2014   Procedure: LEFT HYDROCELECTOMY ADULT;  Surgeon: Festus Aloe, MD;  Location: [facility];  Service: Urology;  Laterality: Left;  . MOHS SURGERY  2015   skin cancer  . TONSILLECTOMY  age 2    There were no vitals filed for this visit.   Subjective Assessment - 06/26/18 0811    Subjective  Patient is a 68 yo males presenting with increased LBP with increased pain most notably with prolonged standing and walking for prolonged periods of time without UE support. Patient demonstrates increased pain which is located centrally along the L3-5 of  his spine. Patient reports some residual pain with sitting but states this pain is a 'faint' marking it as a 1/10. Patient reports when walking with some sort of a walking device such as a staff or walking with a cart at the grocery store.  Patient reports he walks a lot at his occupational job and when we walks without an AD, he will have a further increase of pain exceeding A 5/10 on the NPRS. Patient states the pain has been worsening over the past 1 year. Patient states he sits down to alleviate pain.     Pertinent History  Chronic LBP - previously seen for PT, DMII    How long can you walk comfortably?  10 min    Patient Stated Goals  To decrease pain    Currently in Pain?  Yes    Pain Score  1    worst 5/10; best 0/10   Pain Location  Back    Pain Orientation  Lower    Pain Descriptors / Indicators  Aching;Discomfort    Pain  Type  Acute pain    Pain Frequency  Intermittent         [facility] PT Assessment - 06/26/18 0836      Assessment   Medical Diagnosis  Low back pain     Referring Provider (PT)  Marin Olp PA    Onset Date/Surgical Date  01/29/10    Hand Dominance  Right    Next MD Visit  unknown    Prior Therapy  yes - LBP      Balance Screen   Has the patient fallen in the past 6 months  No    Has the patient had a decrease in activity level because of a fear of falling?   Yes    Is the patient reluctant to leave their home because of a fear of falling?   No      Home Film/video editor residence    Living Arrangements  Spouse/significant other      Prior Function   Level of Independence  Independent    Vocation  Part time employment    Vocation Requirements  walking - [employer]    Leisure  family      Cognition   Overall Cognitive Status  Within Functional Limits for tasks assessed      Observation/Other Assessments   Observations  Lumbar flexion in standing      Sensation   Light Touch  Appears Intact      Functional Tests   Functional tests  Sit to Stand      Sit to Stand   Comments  Unable to perform full lumbar extension       Posture/Postural Control   Posture Comments  Patient constantly forward flexed throughout session      ROM / Strength   AROM / PROM / Strength  AROM;Strength      AROM   AROM Assessment Site  Lumbar;Hip    Right/Left Hip  Right;Left    Right Hip Extension  2    Right Hip Flexion  120    Right Hip External Rotation   30    Right Hip Internal Rotation   5    Right Hip ABduction  30    Right Hip ADduction  10    Left Hip Extension  2    Left Hip Flexion  120    Left Hip External Rotation   30  Left Hip Internal Rotation   5    Left Hip ABduction  20    Left Hip ADduction  10    Lumbar Flexion  WNL    Lumbar Extension  able to acheive neutral nothing past    Lumbar - Right Side Bend  50% limited    Lumbar - Left  Side Bend  50% limited    Lumbar - Right Rotation  50% limited    Lumbar - Left Rotation  50% limited      Strength   Strength Assessment Site  Hip;Lumbar    Right/Left Hip  Right;Left    Right Hip Flexion  4+/5    Right Hip Extension  3/5    Right Hip External Rotation   3+/5    Right Hip Internal Rotation  3+/5    Right Hip ABduction  4-/5    Right Hip ADduction  4-/5    Left Hip Flexion  4+/5    Left Hip Extension  3+/5    Left Hip External Rotation  3+/5    Left Hip Internal Rotation  3+/5    Left Hip ABduction  4/5    Left Hip ADduction  4/5    Lumbar Flexion  4/5    Lumbar Extension  3+/5      Palpation   Spinal mobility  Hypomobility: L2-5 Increased pain with PA    Palpation comment  Increased pain and spasms along multifidus      Special Tests    Special Tests  Lumbar    Lumbar Tests  FABER test;Prone Knee Bend Test;Straight Leg Raise      FABER test   findings  Negative    Comment  b      Prone Knee Bend Test   Findings  Positive    Comment  b      Straight Leg Raise   Findings  Positive    Comment  B      Transfers   Five time sit to stand comments   25 sec      Ambulation/Gait   Gait Comments  Wide BOS, B hip ER,, decreased hip extension, forward lean       Objective measurements completed on examination: See above findings.    TREATMENT Therapeutic Exercise CKC lumbar hip extension in standing -- x 10 Prone on elbows -- x 15 Hip extension in prone -- x 20  Patient demonstrates improvement in pain with exercises. Performed to improve lumbar extension in standing.           PT Education - 06/26/18 0822    Education Details  form/technique with exercise; prone press up, standing hip/lumbar CKC    Person(s) Educated  Patient    Methods  Explanation;Demonstration    Comprehension  Verbalized understanding;Returned demonstration          PT Long Term Goals - 06/26/18 6962      PT LONG TERM GOAL #1   Title  Patient will be  independent with HEP to continue benefits of therapy until after discharge.     Baseline  dependent    Time  6    Period  Weeks    Status  New    Target Date  08/06/18      PT LONG TERM GOAL #2   Title  Patient will improve 5XSTS to <16 sec to demonstrate significant improvement in functional LE strength and improved ability to transfer    Baseline  5XSTS: 255sec  Time  6    Period  Weeks    Status  New    Target Date  08/06/18      PT LONG TERM GOAL #3   Title  Patient will have a worse pain in the back/LE to 2/10 in the past 2 weeks to demonstrate significant improvement in pain level and better ability to walk.     Baseline  worse pain: 5/10    Time  6    Period  Weeks    Status  New    Target Date  08/06/18             Plan - 06/26/18 0824    Clinical Impression Statement  Patient is a 68 yo right hand dominant male presenting with increased LBP most noteable with standing and walking for prolonged periods of time. Patient demonstrates increased lumbar dysfunction with poor lumbar extension and difficulty with returning to standing. Patient demosntrates decreased hip strength and lumbar mobility. Most prominent issue seems to be decreased hip/lumbar extension with standing. Decreased pain after performing hip/lumbar extension in standing. Patient will benefit from further skilled therapy to return to prior level of function.     Personal Factors and Comorbidities  Age;Comorbidity 1;Behavior Pattern    Comorbidities  DMII, Chronic pain     Examination-Activity Limitations  Carry;Bend;Lift;Stand    Examination-Participation Restrictions  Community Activity;Other   walking   Stability/Clinical Decision Making  Evolving/Moderate complexity    Clinical Decision Making  Moderate    Rehab Potential  Good    PT Frequency  2x / week    PT Duration  6 weeks    PT Treatment/Interventions  Patient/family education;Therapeutic exercise;Therapeutic activities;Neuromuscular  re-education;Stair training;Electrical Stimulation;Moist Heat;Manual techniques;Dry needling;Scar mobilization;Joint Manipulations;Spinal Manipulations;Passive range of motion    PT Next Visit Plan  continue extension based exercise    PT Home Exercise Plan  see education section    Consulted and Agree with Plan of Care  Patient       Patient will benefit from skilled therapeutic intervention in order to improve the following deficits and impairments:  Abnormal gait, Decreased balance, Decreased endurance, Decreased mobility, Difficulty walking, Increased muscle spasms, Decreased range of motion, Obesity, Decreased activity tolerance, Decreased strength, Pain  Visit Diagnosis: Chronic bilateral low back pain, unspecified whether sciatica present - Plan: PT plan of care cert/re-cert  Muscle weakness (generalized) - Plan: PT plan of care cert/re-cert     Problem List Patient Active Problem List   Diagnosis Date Noted  . Laceration of left little finger without foreign body without damage to nail 06/17/2018  . Osteoarthritis of knee 05/23/2017  . Cerumen impaction 05/20/2017  . Ex-smoker 03/27/2017  . Elevated PSA 03/27/2017  . Personal history of colonic polyps   . Stress due to family tension 05/15/2016  . Skin rash 05/15/2016  . Hx of colonic polyps   . Benign neoplasm of transverse colon   . Polyp of sigmoid colon   . Benign neoplasm of descending colon   . Chronic midline low back pain 02/27/2016  . Bilateral hip pain 11/02/2015  . Right knee pain 07/12/2015  . Health maintenance examination 03/07/2015  . Severe obesity (BMI 35.0-39.9) with comorbidity ([place]) 03/07/2015  . Advanced care planning/counseling discussion 03/07/2015  . Controlled diabetes mellitus type 2 with complications ([place]) 67/34/1937  . Dyslipidemia associated with type 2 diabetes mellitus ([place]) 05/06/2014  . OSA (obstructive sleep apnea) 05/06/2014  . Fatty liver disease, nonalcoholic 90/24/0973  . H/O  malignant  neoplasm of skin 01/01/2013  . HTN (hypertension) 02/15/2009  . CAD (coronary artery disease), native coronary artery 02/15/2009  . Shortness of breath 02/15/2009    Blythe Stanford, PT DPT 06/26/2018, 8:47 AM  [facility] [address] [place], [address] Phone: [phone]   Fax:  [phone]  Name: Casey Golden MRN: [id number] Date of Birth: 1950/04/18

## 2018-07-01 ENCOUNTER — Other Ambulatory Visit: Payer: Self-pay

## 2018-07-01 ENCOUNTER — Ambulatory Visit: Payer: 59 | Attending: Student

## 2018-07-01 DIAGNOSIS — M545 Low back pain, unspecified: Secondary | ICD-10-CM

## 2018-07-01 DIAGNOSIS — M6281 Muscle weakness (generalized): Secondary | ICD-10-CM

## 2018-07-01 DIAGNOSIS — G8929 Other chronic pain: Secondary | ICD-10-CM | POA: Diagnosis present

## 2018-07-01 NOTE — Therapy (Signed)
[facility] [address] [place], [address] Phone: [phone]   Fax:  [phone]  Physical Therapy Treatment  Patient Details  Name: Casey Golden MRN: [id number] Date of Birth: May 04, 1950 Referring Provider (PT): Marin Olp PA   Encounter Date: 07/01/2018  PT End of Session - 07/01/18 1510    Visit Number  2    Number of Visits  13    Date for PT Re-Evaluation  08/06/18    Authorization Type  2 / 10 G Code    PT Start Time  1500    PT Stop Time  1547    PT Time Calculation (min)  47 min    Activity Tolerance  Patient tolerated treatment well    Behavior During Therapy  [facility] for tasks assessed/performed       Past Medical History:  Diagnosis Date  . Arthritis   . CAD (coronary artery disease)    Cardiologist--  Dr. Ida Rogue  . Cataract 2019   bilateral; resolved with surgery  . Colonic polyp   . Fatty liver disease, nonalcoholic 0932   by [place]  . GERD (gastroesophageal reflux disease)   . History of chicken pox   . Hyperlipidemia   . Hypertension   . Nocturia   . OSA (obstructive sleep apnea)    per pt no cpap due to sleep center/ insurance issue-- study done 2014  . Rash of genital area    09-08-2014  per pt Dr Junious Silk aware  . Seasonal and perennial allergic rhinitis   . Type 2 diabetes mellitus ([place])   . Wears dentures    full upper/  partial lower  . Wears glasses     Past Surgical History:  Procedure Laterality Date  . ABDOMINAL HERNIA REPAIR  2007      [facility]   open repair  . APPENDECTOMY  age 68  . CARDIAC CATHETERIZATION  12-23-2007   [facility]   Abnormal myoview w/ ischemia/  40% mRCA with nonobstructive and no sig. plaque in his left system, EF 55%  . CARDIAC CATHETERIZATION  Apr 2008    [facility]   Abnormal myoview/  50% RCA,  ef 65%  . CARDIAC CATHETERIZATION  1999      [facility]  . CATARACT EXTRACTION, BILATERAL Bilateral 11/2017  . CERVICAL FUSION  1992  . CHOLECYSTECTOMY  OPEN  2006  . COLONOSCOPY WITH PROPOFOL N/A 04/17/2016   TAs, high grade dysplasia with margins clear, diverticulosis Lucilla Lame, MD)  . COLONOSCOPY WITH PROPOFOL N/A 08/07/2016   TAx1, diverticulosis, rpt 3 yrs ([facility])  . DENTAL SURGERY     metal dental implant L mandible  . EXCISION OF SKIN TAG Right 09/14/2014   Procedure: EXCISION OF SKIN TAG;  Surgeon: Festus Aloe, MD;  Location[facility];  Service: Urology;  Laterality: Right;  . HYDROCELE EXCISION Left 09/14/2014   Procedure: LEFT HYDROCELECTOMY ADULT;  Surgeon: Festus Aloe, MD;  Location: [facility];  Service: Urology;  Laterality: Left;  . MOHS SURGERY  2015   skin cancer  . TONSILLECTOMY  age 68    There were no vitals filed for this visit.  Subjective Assessment - 07/01/18 1505    Subjective  Patient reports he has been walking with his walking stick and states things have been a bit better since doing this. Patient states he continues to have increased pain but it hasn't been as bad.     Pertinent History  Chronic LBP - previously seen for PT, DMII    How long can you walk comfortably?  10 min    Patient Stated Goals  To decrease pain    Currently in Pain?  Yes    Pain Score  1     Pain Location  Back    Pain Orientation  Lower    Pain Descriptors / Indicators  Aching    Pain Type  Chronic pain    Pain Frequency  Intermittent       TREATMENT Therapeutic Exercise Prone press ups on table (up on elbows) - 2 x 10 Hip flexor stretch in prone - x 12 B with 3 sec stretch [place] stretch in prone - x 12 B with 2 sec holds Bridges in hooklying - x 20  Ambulation with focus on performing decreased hip ER and increased lumbar extension in standing Forward foot on step hip flexor stretch in standing - x 20 Standing hip/lumbar stretch in standing - x 20  Hip extension in standing - x 20 with B UE support  Manual therapy STM performed along the multifidus with patient positioned in prone  to decrease increased pain and spasms utilizing superficial techniques - x 10 min  Performed exercises to improve lumbar and hip extension in standing with walking. Patient demonstrates improvement with posture at end of the session after performing mobility exercises.    PT Education - 07/01/18 1509    Education Details  form/technique with exercise; bridges     Person(s) Educated  Patient    Methods  Demonstration;Explanation    Comprehension  Verbalized understanding;Returned demonstration          PT Long Term Goals - 06/26/18 8413      PT LONG TERM GOAL #1   Title  Patient will be independent with HEP to continue benefits of therapy until after discharge.     Baseline  dependent    Time  6    Period  Weeks    Status  New    Target Date  08/06/18      PT LONG TERM GOAL #2   Title  Patient will improve 5XSTS to <16 sec to demonstrate significant improvement in functional LE strength and improved ability to transfer    Baseline  5XSTS: 255sec     Time  6    Period  Weeks    Status  New    Target Date  08/06/18      PT LONG TERM GOAL #3   Title  Patient will have a worse pain in the back/LE to 2/10 in the past 2 weeks to demonstrate significant improvement in pain level and better ability to walk.     Baseline  worse pain: 5/10    Time  6    Period  Weeks    Status  New    Target Date  08/06/18            Plan - 07/01/18 1557    Clinical Impression Statement  Patient demonstrates improvement in hip/lumbar extension after performing mobility and stretching exercises. Reinforced exercises to improve ROM with strengthening to further advance the patient's condition. Patient able to walk with beter technqique and less prompting compared to the previous session indicating functional carryover. Patient will benefit from further skilled therapy to return to prior level of function.    Personal Factors and Comorbidities  Age;Comorbidity 1;Behavior Pattern    Comorbidities   DMII, Chronic pain     Examination-Activity  Limitations  Carry;Bend;Lift;Stand    Examination-Participation Restrictions  Community Activity;Other   walking   Stability/Clinical Decision Making  Evolving/Moderate complexity    Rehab Potential  Good    PT Frequency  2x / week    PT Duration  6 weeks    PT Treatment/Interventions  Patient/family education;Therapeutic exercise;Therapeutic activities;Neuromuscular re-education;Stair training;Electrical Stimulation;Moist Heat;Manual techniques;Dry needling;Scar mobilization;Joint Manipulations;Spinal Manipulations;Passive range of motion    PT Next Visit Plan  continue extension based exercise    PT Home Exercise Plan  see education section    Consulted and Agree with Plan of Care  Patient       Patient will benefit from skilled therapeutic intervention in order to improve the following deficits and impairments:  Abnormal gait, Decreased balance, Decreased endurance, Decreased mobility, Difficulty walking, Increased muscle spasms, Decreased range of motion, Obesity, Decreased activity tolerance, Decreased strength, Pain  Visit Diagnosis: Chronic bilateral low back pain, unspecified whether sciatica present  Muscle weakness (generalized)     Problem List Patient Active Problem List   Diagnosis Date Noted  . Laceration of left little finger without foreign body without damage to nail 06/17/2018  . Osteoarthritis of knee 05/23/2017  . Cerumen impaction 05/20/2017  . Ex-smoker 03/27/2017  . Elevated PSA 03/27/2017  . Personal history of colonic polyps   . Stress due to family tension 05/15/2016  . Skin rash 05/15/2016  . Hx of colonic polyps   . Benign neoplasm of transverse colon   . Polyp of sigmoid colon   . Benign neoplasm of descending colon   . Chronic midline low back pain 02/27/2016  . Bilateral hip pain 11/02/2015  . Right knee pain 07/12/2015  . Health maintenance examination 03/07/2015  . Severe obesity (BMI 35.0-39.9)  with comorbidity ([place]) 03/07/2015  . Advanced care planning/counseling discussion 03/07/2015  . Controlled diabetes mellitus type 2 with complications ([place]) 16/07/3708  . Dyslipidemia associated with type 2 diabetes mellitus ([place]) 05/06/2014  . OSA (obstructive sleep apnea) 05/06/2014  . Fatty liver disease, nonalcoholic 62/69/4854  . H/O malignant neoplasm of skin 01/01/2013  . HTN (hypertension) 02/15/2009  . CAD (coronary artery disease), native coronary artery 02/15/2009  . Shortness of breath 02/15/2009    Blythe Stanford, PT DPT 07/01/2018, 3:59 PM  [facility] [address] [place], [address] Phone: [phone]   Fax:  [phone]  Name: Tayshon Winker MRN: [id number] Date of Birth: 07-17-1950

## 2018-07-03 ENCOUNTER — Ambulatory Visit: Payer: 59

## 2018-07-08 ENCOUNTER — Ambulatory Visit: Payer: 59

## 2018-07-08 ENCOUNTER — Other Ambulatory Visit: Payer: Self-pay

## 2018-07-08 DIAGNOSIS — G8929 Other chronic pain: Secondary | ICD-10-CM

## 2018-07-08 DIAGNOSIS — M6281 Muscle weakness (generalized): Secondary | ICD-10-CM

## 2018-07-08 DIAGNOSIS — M545 Low back pain: Secondary | ICD-10-CM | POA: Diagnosis not present

## 2018-07-08 MED ORDER — GABAPENTIN 100 MG PO CAPS: ORAL_CAPSULE | ORAL | 3 refills | Status: DC

## 2018-07-08 NOTE — Telephone Encounter (Signed)
Pharmacy refill request for Gabapentin 100mg .  Per Dr. Stephenie Acres verbal order, ok to refill.   Script has been sent to pharmacy

## 2018-07-08 NOTE — Therapy (Signed)
[facility] [address], [place], [address] Phone: [phone]   Fax:  [phone]  Physical Therapy Treatment  Patient Details  Name: Casey Golden MRN: [id number] Date of Birth: 09/29/1950 Referring Provider (PT): Marin Olp PA   Encounter Date: 07/08/2018  PT End of Session - 07/08/18 1429    Visit Number  3    Number of Visits  13    Date for PT Re-Evaluation  08/06/18    Authorization Type  3 / 10 G Code    PT Start Time  1400    PT Stop Time  8185    PT Time Calculation (min)  45 min    Activity Tolerance  Patient tolerated treatment well    Behavior During Therapy  [facility] for tasks assessed/performed       Past Medical History:  Diagnosis Date  . Arthritis   . CAD (coronary artery disease)    Cardiologist--  Dr. Ida Rogue  . Cataract 2019   bilateral; resolved with surgery  . Colonic polyp   . Fatty liver disease, nonalcoholic 6314   by [place]  . GERD (gastroesophageal reflux disease)   . History of chicken pox   . Hyperlipidemia   . Hypertension   . Nocturia   . OSA (obstructive sleep apnea)    per pt no cpap due to sleep center/ insurance issue-- study done 2014  . Rash of genital area    09-08-2014  per pt Dr Junious Silk aware  . Seasonal and perennial allergic rhinitis   . Type 2 diabetes mellitus ([place])   . Wears dentures    full upper/  partial lower  . Wears glasses     Past Surgical History:  Procedure Laterality Date  . ABDOMINAL HERNIA REPAIR  2007      [facility]   open repair  . APPENDECTOMY  age 41  . CARDIAC CATHETERIZATION  12-23-2007   [facility]   Abnormal myoview w/ ischemia/  40% mRCA with nonobstructive and no sig. plaque in his left system, EF 55%  . CARDIAC CATHETERIZATION  Apr 2008    [facility]   Abnormal myoview/  50% RCA,  ef 65%  . CARDIAC CATHETERIZATION  1999      [facility]  . CATARACT EXTRACTION, BILATERAL Bilateral 11/2017  . CERVICAL FUSION  1992  . CHOLECYSTECTOMY  OPEN  2006  . COLONOSCOPY WITH PROPOFOL N/A 04/17/2016   TAs, high grade dysplasia with margins clear, diverticulosis Lucilla Lame, MD)  . COLONOSCOPY WITH PROPOFOL N/A 08/07/2016   TAx1, diverticulosis, rpt 3 yrs ([facility])  . DENTAL SURGERY     metal dental implant L mandible  . EXCISION OF SKIN TAG Right 09/14/2014   Procedure: EXCISION OF SKIN TAG;  Surgeon: Festus Aloe, MD;  Location: [facility];  Service: Urology;  Laterality: Right;  . HYDROCELE EXCISION Left 09/14/2014   Procedure: LEFT HYDROCELECTOMY ADULT;  Surgeon: Festus Aloe, MD;  Location[facility];  Service: Urology;  Laterality: Left;  . MOHS SURGERY  2015   skin cancer  . TONSILLECTOMY  age 38    There were no vitals filed for this visit.  Subjective Assessment - 07/08/18 1426    Subjective  Patient reports increased pain today which has been increased since the weekend. Patient states increased pain that he had to roll on analgesic cream.     Pertinent History  Chronic LBP - previously seen for PT, DMII  How long can you walk comfortably?  10 min    Patient Stated Goals  To decrease pain    Currently in Pain?  Yes    Pain Score  4     Pain Location  Back    Pain Orientation  Left;Right    Pain Descriptors / Indicators  Aching    Pain Type  Chronic pain    Pain Onset  More than a month ago    Pain Frequency  Intermittent       TREATMENT Therapeutic Exercise Prone press ups on table (up on elbows) - x 15 Sit to stand with hip/lumbar extension in standing - x 15 Hip flexor stretch in standing - x 15 B with 3 sec stretch on step Standing hip/lumbar stretch in standing - x 20  Step ups onto 6" step with UE support - x 20 with lumbar extension at top. Backwards amb to address lumbar extension - x 20  Lateral shifts into extension in standing - x 20   Manual therapy STM performed along the multifidus with patient positioned in prone to decrease increased pain and spasms  utilizing superficial techniques - x 10 min   Performed exercises to improve lumbar and hip extension in standing with walking. Patient demonstrates improvement with posture at end of the session after performing mobility exercises.    PT Education - 07/08/18 1429    Education Details  form/technique with exercise standing hip extension     Person(s) Educated  Patient    Methods  Explanation;Demonstration    Comprehension  Verbalized understanding;Returned demonstration          PT Long Term Goals - 06/26/18 5009      PT LONG TERM GOAL #1   Title  Patient will be independent with HEP to continue benefits of therapy until after discharge.     Baseline  dependent    Time  6    Period  Weeks    Status  New    Target Date  08/06/18      PT LONG TERM GOAL #2   Title  Patient will improve 5XSTS to <16 sec to demonstrate significant improvement in functional LE strength and improved ability to transfer    Baseline  5XSTS: 255sec     Time  6    Period  Weeks    Status  New    Target Date  08/06/18      PT LONG TERM GOAL #3   Title  Patient will have a worse pain in the back/LE to 2/10 in the past 2 weeks to demonstrate significant improvement in pain level and better ability to walk.     Baseline  worse pain: 5/10    Time  6    Period  Weeks    Status  New    Target Date  08/06/18            Plan - 07/08/18 1430    Clinical Impression Statement  Patient demonstrates improvement in lumbar extension at the end of the session compared to the beginning of the session indicating functional carryover between sessions. Although patient is improving, he continues to have increased pain and spasms along L4/5. Patient will benefit from further skilled therapy focused on improving lumbar extension in standing. Patient will benefit from further skilled therapy to return to prior level of function.      Personal Factors and Comorbidities  Age;Comorbidity 1    Comorbidities  DMII,  Chronic pain  Examination-Activity Limitations  Carry;Bend;Lift;Stand    Examination-Participation Restrictions  Community Activity;Other   walking   Stability/Clinical Decision Making  Evolving/Moderate complexity    Rehab Potential  Good    PT Frequency  2x / week    PT Duration  6 weeks    PT Treatment/Interventions  Patient/family education;Therapeutic exercise;Therapeutic activities;Neuromuscular re-education;Stair training;Electrical Stimulation;Moist Heat;Manual techniques;Dry needling;Scar mobilization;Joint Manipulations;Spinal Manipulations;Passive range of motion    PT Next Visit Plan  continue extension based exercise    PT Home Exercise Plan  see education section    Consulted and Agree with Plan of Care  Patient       Patient will benefit from skilled therapeutic intervention in order to improve the following deficits and impairments:  Abnormal gait, Decreased balance, Decreased endurance, Decreased mobility, Difficulty walking, Increased muscle spasms, Decreased range of motion, Obesity, Decreased activity tolerance, Decreased strength, Pain  Visit Diagnosis: Chronic bilateral low back pain, unspecified whether sciatica present  Muscle weakness (generalized)     Problem List Patient Active Problem List   Diagnosis Date Noted  . Laceration of left little finger without foreign body without damage to nail 06/17/2018  . Osteoarthritis of knee 05/23/2017  . Cerumen impaction 05/20/2017  . Ex-smoker 03/27/2017  . Elevated PSA 03/27/2017  . Personal history of colonic polyps   . Stress due to family tension 05/15/2016  . Skin rash 05/15/2016  . Hx of colonic polyps   . Benign neoplasm of transverse colon   . Polyp of sigmoid colon   . Benign neoplasm of descending colon   . Chronic midline low back pain 02/27/2016  . Bilateral hip pain 11/02/2015  . Right knee pain 07/12/2015  . Health maintenance examination 03/07/2015  . Severe obesity (BMI 35.0-39.9) with  comorbidity ([place]) 03/07/2015  . Advanced care planning/counseling discussion 03/07/2015  . Controlled diabetes mellitus type 2 with complications ([place]) 42/87/6811  . Dyslipidemia associated with type 2 diabetes mellitus ([place]) 05/06/2014  . OSA (obstructive sleep apnea) 05/06/2014  . Fatty liver disease, nonalcoholic 57/26/2035  . H/O malignant neoplasm of skin 01/01/2013  . HTN (hypertension) 02/15/2009  . CAD (coronary artery disease), native coronary artery 02/15/2009  . Shortness of breath 02/15/2009    Blythe Stanford, PT DPT 07/08/2018, 2:40 PM  [facility] [address] [place], [address] Phone: [phone]   Fax:  [phone]  Name: Casey Golden MRN: [id number] Date of Birth: 02-06-1950

## 2018-07-10 ENCOUNTER — Ambulatory Visit: Payer: 59

## 2018-07-21 ENCOUNTER — Telehealth: Payer: Self-pay

## 2018-07-21 MED ORDER — LOSARTAN POTASSIUM 50 MG PO TABS: 50.0000 mg | ORAL_TABLET | Freq: Two times a day (BID) | ORAL | 1 refills | Status: DC

## 2018-07-21 NOTE — Telephone Encounter (Signed)
Substitution refill sent to cover while medication is on backorder.

## 2018-07-21 NOTE — Telephone Encounter (Signed)
Incoming fax from [facility].  Losartan 100MG  tablets are currently on back order.  Pharmacy requesting prescription for Losartan 50MG  BID.

## 2018-07-23 ENCOUNTER — Encounter: Payer: Self-pay | Admitting: Physician Assistant

## 2018-07-23 ENCOUNTER — Emergency Department
Admission: EM | Admit: 2018-07-23 | Discharge: 2018-07-23 | Disposition: A | Discharge status: Home / Self Care | Payer: 59 | Attending: Emergency Medicine | Admitting: Emergency Medicine

## 2018-07-23 ENCOUNTER — Ambulatory Visit: Payer: 59

## 2018-07-23 ENCOUNTER — Other Ambulatory Visit: Payer: Self-pay

## 2018-07-23 ENCOUNTER — Telehealth: Payer: Self-pay | Admitting: Cardiovascular Disease

## 2018-07-23 ENCOUNTER — Emergency Department: Payer: 59

## 2018-07-23 ENCOUNTER — Ambulatory Visit (INDEPENDENT_AMBULATORY_CARE_PROVIDER_SITE_OTHER): Payer: 59 | Admitting: Physician Assistant

## 2018-07-23 VITALS — BP 140/70 | HR 72 | Ht 68.0 in | Wt 245.1 lb

## 2018-07-23 DIAGNOSIS — E782 Mixed hyperlipidemia: Secondary | ICD-10-CM | POA: Diagnosis not present

## 2018-07-23 DIAGNOSIS — I200 Unstable angina: Secondary | ICD-10-CM

## 2018-07-23 DIAGNOSIS — E119 Type 2 diabetes mellitus without complications: Secondary | ICD-10-CM | POA: Insufficient documentation

## 2018-07-23 DIAGNOSIS — Z7984 Long term (current) use of oral hypoglycemic drugs: Secondary | ICD-10-CM | POA: Diagnosis not present

## 2018-07-23 DIAGNOSIS — Z79899 Other long term (current) drug therapy: Secondary | ICD-10-CM | POA: Insufficient documentation

## 2018-07-23 DIAGNOSIS — I10 Essential (primary) hypertension: Secondary | ICD-10-CM | POA: Diagnosis not present

## 2018-07-23 DIAGNOSIS — I2510 Atherosclerotic heart disease of native coronary artery without angina pectoris: Secondary | ICD-10-CM | POA: Insufficient documentation

## 2018-07-23 DIAGNOSIS — R079 Chest pain, unspecified: Secondary | ICD-10-CM | POA: Diagnosis present

## 2018-07-23 DIAGNOSIS — G8929 Other chronic pain: Secondary | ICD-10-CM

## 2018-07-23 DIAGNOSIS — R0789 Other chest pain: Secondary | ICD-10-CM | POA: Insufficient documentation

## 2018-07-23 DIAGNOSIS — M545 Low back pain: Secondary | ICD-10-CM | POA: Diagnosis not present

## 2018-07-23 DIAGNOSIS — Z7982 Long term (current) use of aspirin: Secondary | ICD-10-CM | POA: Diagnosis not present

## 2018-07-23 DIAGNOSIS — M6281 Muscle weakness (generalized): Secondary | ICD-10-CM

## 2018-07-23 DIAGNOSIS — Z87891 Personal history of nicotine dependence: Secondary | ICD-10-CM | POA: Insufficient documentation

## 2018-07-23 LAB — BASIC METABOLIC PANEL
Anion gap: 11 (ref 5–15)
BUN: 17 mg/dL (ref 8–23)
CO2: 27 mmol/L (ref 22–32)
Calcium: 9.1 mg/dL (ref 8.9–10.3)
Chloride: 101 mmol/L (ref 98–111)
Creatinine, Ser: 0.78 mg/dL (ref 0.61–1.24)
GFR calc Af Amer: >60 mL/min (ref >60)
GFR calc non Af Amer: >60 mL/min (ref >60)
Glucose, Bld: 122 mg/dL — ABNORMAL HIGH (ref 70–99)
Potassium: 4.2 mmol/L (ref 3.5–5.1)
Sodium: 139 mmol/L (ref 135–145)

## 2018-07-23 LAB — CBC
HCT: 45.7 % (ref 39.0–52.0)
Hemoglobin: 15.9 g/dL (ref 13.0–17.0)
MCH: 33.6 pg (ref 26.0–34.0)
MCHC: 34.8 g/dL (ref 30.0–36.0)
MCV: 96.6 fL (ref 80.0–100.0)
Platelets: 246 10*3/uL (ref 150–400)
RBC: 4.73 MIL/uL (ref 4.22–5.81)
RDW: 11.9 % (ref 11.5–15.5)
WBC: 9.5 10*3/uL (ref 4.0–10.5)
nRBC: 0 % (ref 0.0–0.2)

## 2018-07-23 LAB — TROPONIN I (HIGH SENSITIVITY): Troponin I (High Sensitivity): 4 ng/L (ref <18)

## 2018-07-23 MED ORDER — SODIUM CHLORIDE 0.9% FLUSH: 3.0000 mL | Freq: Once | INTRAVENOUS | Status: DC

## 2018-07-23 NOTE — ED Notes (Signed)
Green and purple top tube sent to the lab.

## 2018-07-23 NOTE — Telephone Encounter (Signed)
Patient in lobby  Pt c/o of Chest Pain: STAT if CP now or developed within 24 hours  1. Are you having CP right now? Not really, slight pain, little tightness   2. Are you experiencing any other symptoms (ex. SOB, nausea, vomiting, sweating)? SOB  3. How long have you been experiencing CP? Started noticing Saturday 6/20  4. Is your CP continuous or coming and going? Coming and going but there is always a little tightness   5. Have you taken Nitroglycerin? No  ?

## 2018-07-23 NOTE — Therapy (Signed)
[facility] [address] [place], [address] Phone: [phone]   Fax:  [phone]  Physical Therapy Treatment  Patient Details  Name: Casey Golden MRN: [id number] Date of Birth: 1950/02/08 Referring Provider (PT): Marin Olp PA   Encounter Date: 07/23/2018  PT End of Session - 07/23/18 0821    Visit Number  4    Number of Visits  13    Date for PT Re-Evaluation  08/06/18    Authorization Type  4 / 10 G Code    PT Start Time  0800    PT Stop Time  0845    PT Time Calculation (min)  45 min    Activity Tolerance  Patient tolerated treatment well    Behavior During Therapy  [facility] for tasks assessed/performed       Past Medical History:  Diagnosis Date  . Arthritis   . CAD (coronary artery disease)    Cardiologist--  Dr. Ida Rogue  . Cataract 2019   bilateral; resolved with surgery  . Colonic polyp   . Fatty liver disease, nonalcoholic 6962   by [place]  . GERD (gastroesophageal reflux disease)   . History of chicken pox   . Hyperlipidemia   . Hypertension   . Nocturia   . OSA (obstructive sleep apnea)    per pt no cpap due to sleep center/ insurance issue-- study done 2014  . Rash of genital area    09-08-2014  per pt Dr Junious Silk aware  . Seasonal and perennial allergic rhinitis   . Type 2 diabetes mellitus ([place])   . Wears dentures    full upper/  partial lower  . Wears glasses     Past Surgical History:  Procedure Laterality Date  . ABDOMINAL HERNIA REPAIR  2007      [facility]   open repair  . APPENDECTOMY  age 87  . CARDIAC CATHETERIZATION  12-23-2007   [facility]   Abnormal myoview w/ ischemia/  40% mRCA with nonobstructive and no sig. plaque in his left system, EF 55%  . CARDIAC CATHETERIZATION  Apr 2008    [facility]   Abnormal myoview/  50% RCA,  ef 65%  . CARDIAC CATHETERIZATION  1999      [facility]  . CATARACT EXTRACTION, BILATERAL Bilateral 11/2017  . CERVICAL FUSION  1992  .  CHOLECYSTECTOMY OPEN  2006  . COLONOSCOPY WITH PROPOFOL N/A 04/17/2016   TAs, high grade dysplasia with margins clear, diverticulosis Lucilla Lame, MD)  . COLONOSCOPY WITH PROPOFOL N/A 08/07/2016   TAx1, diverticulosis, rpt 3 yrs ([facility])  . DENTAL SURGERY     metal dental implant L mandible  . EXCISION OF SKIN TAG Right 09/14/2014   Procedure: EXCISION OF SKIN TAG;  Surgeon: Festus Aloe, MD;  Location: [facility];  Service: Urology;  Laterality: Right;  . HYDROCELE EXCISION Left 09/14/2014   Procedure: LEFT HYDROCELECTOMY ADULT;  Surgeon: Festus Aloe, MD;  Location: [facility];  Service: Urology;  Laterality: Left;  . MOHS SURGERY  2015   skin cancer  . TONSILLECTOMY  age 64    There were no vitals filed for this visit.  Subjective Assessment - 07/23/18 0811    Subjective  Patient reports intermittent chest pain which comes on intermittently and at random times according to patient report. Patient denies dyspenia on excertion. Patient states the chest pain is minimal.    Pertinent History  Chronic LBP - previously seen for  PT, DMII    How long can you walk comfortably?  10 min    Patient Stated Goals  To decrease pain    Currently in Pain?  Yes    Pain Score  4     Pain Location  Back    Pain Orientation  Left;Right    Pain Descriptors / Indicators  Aching    Pain Type  Chronic pain    Pain Onset  More than a month ago    Pain Frequency  Intermittent        TREATMENT Therapeutic Exercise Prone press ups on table (up on elbows) - x 15 Hip extension in prone -- x 15 Hip IR with RTB in prone -- x 15  Knee extension in prone -- x 15 Lateral shifts in standing to L with hip going to the R -- x 20 Backwards amb to address lumbar extension - x 20  Lateral shifts into extension in standing - x 20   Manual therapy STM performed along the multifidus with patient positioned in prone to decrease increased pain and spasms utilizing superficial  techniques - x 10 min  Performed exercises to improve lumbar and hip extension in standing with walking. Patient demonstrates improvement with posture at end of the session after performing mobility exercises.      PT Education - 07/23/18 0820    Education Details  form/technique with exercise    Person(s) Educated  Patient    Methods  Explanation;Demonstration    Comprehension  Verbalized understanding;Returned demonstration          PT Long Term Goals - 06/26/18 2025      PT LONG TERM GOAL #1   Title  Patient will be independent with HEP to continue benefits of therapy until after discharge.     Baseline  dependent    Time  6    Period  Weeks    Status  New    Target Date  08/06/18      PT LONG TERM GOAL #2   Title  Patient will improve 5XSTS to <16 sec to demonstrate significant improvement in functional LE strength and improved ability to transfer    Baseline  5XSTS: 255sec     Time  6    Period  Weeks    Status  New    Target Date  08/06/18      PT LONG TERM GOAL #3   Title  Patient will have a worse pain in the back/LE to 2/10 in the past 2 weeks to demonstrate significant improvement in pain level and better ability to walk.     Baseline  worse pain: 5/10    Time  6    Period  Weeks    Status  New    Target Date  08/06/18            Plan - 07/23/18 0853    Clinical Impression Statement  Patient demonstates significant improvement with performing lateral shift manual therapy in standing and gave follow up exercise to address patient's lateral shift in standing so limitations can be addressed at work. Patient demonstrate overall improvement in AROM at the end of the session with ability to move past extension in standing. Patient continues to have a forward flexed position when ambulating and will benefit from further skilled therapy to return to prior level of function. Educated patient heavily on reporting to his cardiologist about his mild chest pain to  decrease risk of having a cardiovascular  event. Patient verbally confirms he understands this information.    Personal Factors and Comorbidities  Age;Comorbidity 1    Comorbidities  DMII, Chronic pain     Examination-Activity Limitations  Carry;Bend;Lift;Stand    Examination-Participation Restrictions  Community Activity;Other   walking   Stability/Clinical Decision Making  Evolving/Moderate complexity    Rehab Potential  Good    PT Frequency  2x / week    PT Duration  6 weeks    PT Treatment/Interventions  Patient/family education;Therapeutic exercise;Therapeutic activities;Neuromuscular re-education;Stair training;Electrical Stimulation;Moist Heat;Manual techniques;Dry needling;Scar mobilization;Joint Manipulations;Spinal Manipulations;Passive range of motion    PT Next Visit Plan  continue extension based exercise    PT Home Exercise Plan  see education section    Consulted and Agree with Plan of Care  Patient       Patient will benefit from skilled therapeutic intervention in order to improve the following deficits and impairments:  Abnormal gait, Decreased balance, Decreased endurance, Decreased mobility, Difficulty walking, Increased muscle spasms, Decreased range of motion, Obesity, Decreased activity tolerance, Decreased strength, Pain  Visit Diagnosis: 1. Chronic bilateral low back pain, unspecified whether sciatica present   2. Muscle weakness (generalized)        Problem List Patient Active Problem List   Diagnosis Date Noted  . Laceration of left little finger without foreign body without damage to nail 06/17/2018  . Osteoarthritis of knee 05/23/2017  . Cerumen impaction 05/20/2017  . Ex-smoker 03/27/2017  . Elevated PSA 03/27/2017  . Personal history of colonic polyps   . Stress due to family tension 05/15/2016  . Skin rash 05/15/2016  . Hx of colonic polyps   . Benign neoplasm of transverse colon   . Polyp of sigmoid colon   . Benign neoplasm of descending colon    . Chronic midline low back pain 02/27/2016  . Bilateral hip pain 11/02/2015  . Right knee pain 07/12/2015  . Health maintenance examination 03/07/2015  . Severe obesity (BMI 35.0-39.9) with comorbidity ([place]) 03/07/2015  . Advanced care planning/counseling discussion 03/07/2015  . Controlled diabetes mellitus type 2 with complications ([place]) 68/12/7515  . Dyslipidemia associated with type 2 diabetes mellitus ([place]) 05/06/2014  . OSA (obstructive sleep apnea) 05/06/2014  . Fatty liver disease, nonalcoholic 00/17/4944  . H/O malignant neoplasm of skin 01/01/2013  . HTN (hypertension) 02/15/2009  . CAD (coronary artery disease), native coronary artery 02/15/2009  . Shortness of breath 02/15/2009    Blythe Stanford, PT DPT 07/23/2018, 8:59 AM  [facility] [address], [place], [address] Phone: [phone]   Fax:  [phone]  Name: Casey Golden MRN: [id number] Date of Birth: [birth year]

## 2018-07-23 NOTE — ED Triage Notes (Signed)
Pt to triage via wheelchair.  Pt reports chest pain for 2 weeks.  Intermittent pain. Pt also has sob.  Former smoker.  No n/v   Pt alert  Speech clear.

## 2018-07-23 NOTE — Patient Instructions (Signed)
Medication Instructions:  Your physician recommends that you continue on your current medications as directed. Please refer to the Current Medication list given to you today.  If you need a refill on your cardiac medications before your next appointment, please call your pharmacy.   Lab work: None ordered  If you have labs (blood work) drawn today and your tests are completely normal, you will receive your results only by: Marland Kitchen MyChart Message (if you have MyChart) OR . A paper copy in the mail If you have any lab test that is abnormal or we need to change your treatment, we will call you to review the results.  Testing/Procedures: None ordered   Follow-Up: At [facility], you and your health needs are our priority.  As part of our continuing mission to provide you with exceptional heart care, we have created designated Provider Care Teams.  These Care Teams include your primary Cardiologist (physician) and Advanced Practice Providers (APPs -  Physician Assistants and Nurse Practitioners) who all work together to provide you with the care you need, when you need it. Durene Cal to ED, f/u as advised.

## 2018-07-23 NOTE — ED Notes (Signed)
Pt ambulatory to STAT desk without difficulty or distress noted; pt is upset because he is still "waiting on his sonogram"; explained to pt that no sonogram has been ordered, only EKG and lab work; pt remains upset and calls wife on phone; wife also reports same information, that she was told he was sent over for a sonogram; again explained to wife that pt was triaged to see an ED provider regarding his c/o of CP and is awaiting an exam room for further evaluation; reviewed today's cardiology office notes that also indicates that pt was being told to come to ED for further evaluation; wife now voices understanding of such and explained to both plan of care

## 2018-07-23 NOTE — ED Notes (Signed)
Patient discharged to home per MD order. Patient in stable condition, and deemed medically cleared by ED provider for discharge. Discharge instructions reviewed with patient/family using "Teach Back"; verbalized understanding of medication education and administration, and information about follow-up care. Denies further concerns. ° °

## 2018-07-23 NOTE — Progress Notes (Signed)
Cardiology Clinic Note   Patient Name: Casey Golden Date of Encounter: 07/23/2018  Primary Care Provider:  Ria Bush, MD Primary Cardiologist:  Ida Rogue, MD  Patient Profile    Casey Golden is a 68 year old male with past medical history significant for hyperlipidemia, hypertension, nonobstructive CAD with cardiac catheterization x3, past history of syncope, GERD, nonalcoholic fatty liver disease (2015), OSA, and OA/back pain who is being seen today for chest pain with associated SOB.  Past Medical History    Past Medical History:  Diagnosis Date   Arthritis    CAD (coronary artery disease)    cathx3 with nonobstructive disease. Last cath with RCA 40% stenosis in 2009   Cataract 2019   bilateral; resolved with surgery   Colonic polyp    Fatty liver disease, nonalcoholic 0626   by [place]   GERD (gastroesophageal reflux disease)    History of chicken pox    Hyperlipidemia    Hypertension    Nocturia    OSA (obstructive sleep apnea)    CPAP, compliant   Past use of tobacco    Quit 1990, 70 pack year history   Rash of genital area    09-08-2014  per pt Dr Junious Silk aware   Seasonal and perennial allergic rhinitis    Type 2 diabetes mellitus ([place])    Wears dentures    full upper/  partial lower   Wears glasses    Past Surgical History:  Procedure Laterality Date   ABDOMINAL HERNIA REPAIR  2007      [place]   open repair   APPENDECTOMY  age 4   CARDIAC CATHETERIZATION  12-23-2007   [place]   Abnormal myoview w/ ischemia/  40% mRCA with nonobstructive and no sig. plaque in his left system, EF 55%   CARDIAC CATHETERIZATION  Apr 2008    [facility]   Abnormal myoview/  50% RCA,  ef 65%   CARDIAC CATHETERIZATION  1999      [facility]   CATARACT EXTRACTION, BILATERAL Bilateral 11/2017   CERVICAL FUSION  1992   CHOLECYSTECTOMY OPEN  2006   COLONOSCOPY WITH PROPOFOL N/A 04/17/2016   TAs, high grade dysplasia with margins clear, diverticulosis Lucilla Lame, MD)   COLONOSCOPY WITH PROPOFOL N/A 08/07/2016   TAx1, diverticulosis, rpt 3 yrs ([place])   DENTAL SURGERY     metal dental implant L mandible   EXCISION OF SKIN TAG Right 09/14/2014   Procedure: EXCISION OF SKIN TAG;  Surgeon: Festus Aloe, MD;  Location: [facility];  Service: Urology;  Laterality: Right;   HYDROCELE EXCISION Left 09/14/2014   Procedure: LEFT HYDROCELECTOMY ADULT;  Surgeon: Festus Aloe, MD;  Location: [facility];  Service: Urology;  Laterality: Left;   MOHS SURGERY  2015   skin cancer   TONSILLECTOMY  age 16    Allergies  Allergies  Allergen Reactions   Sulfa Antibiotics Hives    History of Present Illness    Casey Golden is a 68 year old male with history of nonobstructive CAD s/p catheterization x3 without intervention, hyperlipidemia, hypertension, history of syncope, obesity, former smoker (quit 1990, 70-pack-year history), nonalcoholic fatty liver disease, OSA, DM 2, OA with back pain, and GERD.  He does have a documented history of SOB, managed with PRN inhaler. Family history includes CAD in his mother, heart disease in his paternal grandfather and paternal grandmother, and stroke in his mother.  Per most recent cardiology note, patient has a history of positive stress test with subsequent  cardiac catheterization x3 for which I am unable to find electronic records for at this time. Per last note, cardiac catheterization 05/13/2006 showed normal EF, 50% mid RCA disease and 30% LAD disease.  Catheterization 11/2007 showed 40% mid RCA disease, normal EF.    He was seen by Dr. Rockey Situ of [facility] 04/02/2018.  At that time, well from a cardiac standpoint.  He denied chest pain but did report chronic pain due to osteoarthritis in his joints and back.  Recommendations were to continue medical therapy for follow-up in a year.   Since that time, the patient has reported intermittent chest pain at rest and unchanged from  previous episodes.  This pain usually occurred at rest and was unchanged from previous episodes of chest pain.  At times, he had associated shortness of breath.  No associated nausea, emesis, syncope, or presyncope.  No symptoms of racing heart rate or palpitations.  No changes in urination or bowel movements.  He reported continued daily physical activity and that he was able to walk a mile daily without symptoms of chest pain.  He also reported medication compliance and that he had started to check his blood pressure at home with SBP in the 110s.  He continued to take ibuprofen for intermittent back pain but only once every few months, his pain relief cream usually "did the trick."  In the last 2 days, however, the patient experienced chest pain that was new for him and different from previous episodes. Specifically, for the past two days, he has experienced chest pain that has woken him from sleep. This pain was further described as non-pleuritic, non-radiating, and located both over his central sternum and on the left side of his chest. The left sided chest pain felt sharp, lasting only a few minutes; however, the sternal pain felt dull and mild in intensity and was estimated to last for up to an hour then subside completely without any intervention.  He was unable to assign a severity (on a scale of 1-10) to his chest pain, given that he had both sharp and dull pain.  He denied any associated triggers.  Patient denied associations with food, gas, position, or other triggers.  No recent changes in physical activity or tenderness to palpation per patient. Then, this morning, he had an episode of chest pain with exertion that lasted only briefly during his usual daily 1 mile walk. This episode was associated with shortness of breath. Due to these changes in his chest pain, he presented to the office with complaint of chest pain and SOB and requested an appointment same day.  Per patient, he was not experiencing  active chest pain at that time. However, while in the waiting room, he felt mild, nonradiating, and nonpleuritic central chest pain. This episode resolved but then returned during his office visit / physical exam. Repeat EKGs were without ST/T changes; however, given active chest pain with atypical and typical features, it was recommended he present to [facility] Department for further workup and evaluation, rather than proceed with the previous plan of outpatient stress test.    Most recent labs 05/22/2018 K 5.0 Na 137 Cr 0.80, BUN 17 AST 33, ALT 36 LDL 44 with HDL 31.00  Home Medications    Prior to Admission medications   Medication Sig Start Date End Date Taking? Authorizing Provider  albuterol (PROVENTIL HFA;VENTOLIN HFA) 108 (90 Base) MCG/ACT inhaler INHALE 2 PUFFS BY MOUTH EVERY 6 HOURS AS NEEDED FOR WHEEZING 03/27/18  Yes Ria Bush, MD  aspirin 81 MG EC tablet Take 1 tablet (81 mg total) by mouth daily. 09/17/14  Yes Festus Aloe, MD  atenolol (TENORMIN) 25 MG tablet TAKE 1 TABLET BY MOUTH EVERY DAY 09/23/17  Yes Gollan, Kathlene November, MD  atorvastatin (LIPITOR) 20 MG tablet TAKE 1 TABLET BY MOUTH EVERY DAY 04/09/18  Yes Gollan, Kathlene November, MD  Blood Glucose Monitoring Suppl (BLOOD GLUCOSE MONITOR SYSTEM) w/Device KIT 1 each by Does not apply route daily. Use as directed to check blood sugar once daily 07/24/17  Yes Ria Bush, MD  Cyanocobalamin (VITAMIN B 12 PO) Take 1 tablet by mouth daily.   Yes [provider]  diclofenac sodium (VOLTAREN) 1 % GEL APPLY 2 G TOPICALLY 3 (THREE) TIMES DAILY AS NEEDED (ANTI INFLAMMATORY). 07/24/17  Yes Ria Bush, MD  gabapentin (NEURONTIN) 300 MG capsule TAKE 1 CAPSULE BY MOUTH TWICE A DAY - 1 IN THE MORNING AND 1 AT BEDTIME ALONG WITH 100 MG CAPSULE 01/17/18  Yes Hyatt, Max T, DPM  Glucose Blood (BLOOD GLUCOSE TEST STRIPS) STRP 1 each by In Vitro route daily. Use as directed to check blood sugar once daily 07/24/17   Yes Ria Bush, MD  ibuprofen (ADVIL,MOTRIN) 200 MG tablet Take 400-600 mg by mouth every 6 (six) hours as needed for mild pain (DEPENDS ON PAIN IF TAKES 400-600 MG).    Yes [provider]  Lancets MISC 1 each by Does not apply route daily. Use as directed to check blood sugar daily 07/24/17  Yes Ria Bush, MD  losartan (COZAAR) 50 MG tablet Take 1 tablet (50 mg total) by mouth 2 (two) times daily. 07/21/18  Yes Minna Merritts, MD  metFORMIN (GLUCOPHAGE) 1000 MG tablet TAKE 1 TABLET TWICE DAILY WITH A MEAL 10/16/17  Yes Ria Bush, MD    Family History    Family History  Problem Relation Age of Onset   CAD Mother        MI   Diabetes Mother    Stroke Mother        mini-stroke   Cancer Father 72       lung (smoker)   Heart disease Paternal Grandmother    Heart disease Paternal Grandfather    Diabetes Paternal Grandfather    Cancer Sister        lung   Coronary artery disease Neg Hx        Premature   He indicated that his mother is deceased. He indicated that his father is deceased. He indicated that the status of his sister is unknown. He indicated that the status of his paternal grandmother is unknown. He indicated that his paternal grandfather is deceased. He indicated that the status of his neg hx is unknown.  Social History    Social History   Socioeconomic History   Marital status: Married    Spouse name: Not on file   Number of children: Not on file   Years of education: Not on file   Highest education level: Not on file  Occupational History   Occupation: Full time    Employer: [employer]  Social Needs   Financial resource strain: Not on file   Food insecurity    Worry: Not on file    Inability: Not on file   Transportation needs    Medical: Not on file    Non-medical: Not on file  Tobacco Use   Smoking status: Former Smoker    Packs/day: 2.00  Years: 35.00    Pack years: 70.00    Types:  Cigarettes    Quit date: 01/29/1990    Years since quitting: 28.4   Smokeless tobacco: Never Used  Substance and Sexual Activity   Alcohol use: No   Drug use: No   Sexual activity: Yes  Lifestyle   Physical activity    Days per week: Not on file    Minutes per session: Not on file   Stress: Not on file  Relationships   Social connections    Talks on phone: Not on file    Gets together: Not on file    Attends religious service: Not on file    Active member of club or organization: Not on file    Attends meetings of clubs or organizations: Not on file    Relationship status: Not on file   Intimate partner violence    Fear of current or ex partner: Not on file    Emotionally abused: Not on file    Physically abused: Not on file    Forced sexual activity: Not on file  Other Topics Concern   Not on file  Social History Narrative   Lives with wife, dog and cats    Occupation: retired, was self employed, now works at [employer]    Edu: HS   Activity: walks 1.5 mi daily   Diet: some water, fruits/vegetables daily     Review of Systems    General:  No chills, fever, night sweats or weight changes.  Cardiovascular:  ++chest pain at rest and with exertion as of this AM (6/24), ++ chest pain that wakes from sleep x2 days, ++dyspnea on exertion, no edema, no orthopnea, no palpitations, no paroxysmal nocturnal dyspnea. Dermatological: No rash, lesions/masses Respiratory: No cough  Urologic: No hematuria, dysuria Abdominal:   No nausea, vomiting, diarrhea, bright red blood per rectum, melena, or hematemesis Neurologic:  No visual changes, wkns, changes in mental status. No recent falls, presyncope, syncope All other systems reviewed and are otherwise negative except as noted above.  Physical Exam    VS:  BP 140/70 (BP Location: Left Arm, Patient Position: Sitting, Cuff Size: Normal)    Pulse 72    Ht _0  (1.727 m)    Wt 245 lb 1.9 oz (111.2 kg)    BMI 37.27 kg/m  , BMI  Body mass index is 37.27 kg/m. GEN: Obese, in no acute distress. HEENT: normal. Neck: Supple, no JVD, carotid bruits, or masses. Cardiac: RRR, no murmurs, rubs, or gallops. No clubbing, cyanosis, edema.  Radials/DP/PT 2+ and equal bilaterally.  Respiratory:  Respirations regular and unlabored, clear to auscultation bilaterally. GI: Soft, nontender, nondistended, BS + x 4. MS: no deformity or atrophy. No lower extremity edema. Skin: warm and dry, no rash. Neuro:  Strength and sensation are intact. Psych: Normal affect.  Accessory Clinical Findings    ECG personally reviewed by me today- NSR, 72 bpm  - No acute changes from previous EKGs  Lab Results  Component Value Date   WBC 9.5 07/23/2018   HGB 15.9 07/23/2018   HCT 45.7 07/23/2018   MCV 96.6 07/23/2018   PLT 246 07/23/2018    Lab Results  Component Value Date   CREATININE 0.78 07/23/2018   BUN 17 07/23/2018   NA 139 07/23/2018   K 4.2 07/23/2018   CL 101 07/23/2018   CO2 27 07/23/2018     Lab Results  Component Value Date   LDLDIRECT 44.0 05/22/2018  Assessment & Plan   Chest pain with history of nonobstructive CAD --Patient reports history of waking up with chest pain x2 days.  Chest pain has both atypical and typical features.  Usually occurs at rest and can be either left sided and sharp or central and dull pain.  Episodes last from minutes to at most an hour.  Denies triggers of food or gas/constipation.  Associated symptoms include shortness of breath.   --Past cardiac workup of abnormal stress test leading to cardiac catheterization x3 but not available per review of EMR. 05/13/2006 showed normal EF, 50% mid RCA disease and 30% LAD disease.  Catheterization 11/2007 showed 40% mid RCA disease, normal EF. PTA medications include ASA, statin, ARB, and atenolol.  --This morning, he had an episode of chest pain and shortness of breath with his daily 1 mile walk, resulting in his presentation to the office and  request for same day cardiology appointment, during which time he had an active episode of chest pain, lasting only minutes and described as mild, central chest pain. Serial / repeat EKGs in the office without any ST or T wave changes. However, given active chest pain with risk factors for cardiac etiology including h/o prior smoking, HTN, HLD, and uncontrolled DM2, as well as family hx of heart disease, recommendation was to present to the emergency department for further work-up including echo, cycling of troponin, and stress test or catheterization if needed. Patient is in agreement.   Essential hypertension -Suboptimal control with blood pressure 140/70 on today's appointment.  Patient reports home blood pressure with systolic in the 237S.  - Continue Losartan 12m daily and consider increasing dose with continued elevated BP.  Could also escalate atenolol as HR allows or consider transitioning to metoprolol succinate in the future. --Discussed long term ibuprofen use and effects on BP with patient reporting only rare use of ibuprofen.   Uncontrolled DM2 with neuropathy --Per IM, PCP. Discussed physical activity and diet in detail. Continue metformin.  HLD -- LDL 44.00 and at goal. Total cholesterol 107. Continue statin therapy with Lipitor 244mdaily.   Hyperkalemia --Check Mg in ED with goal 2.0. --Follow-up BMET to monitor electrolytes.  JaArvil ChacoPA-C 07/23/2018, 5:43 PM

## 2018-07-23 NOTE — ED Provider Notes (Addendum)
[facility] Emergency Department Provider Note   ____________________________________________    I have reviewed the triage vital signs and the nursing notes.   HISTORY  Chief Complaint Chest Pain     HPI Casey Golden is a 68 y.o. male with a history of CAD who reports intermittent chest discomfort over the last 2 weeks.  Sent from cardiologist's office today to have labs performed/ED evaluation.  Patient is chest pain-free currently and has no discomfort.  He denies shortness of breath, cough or fevers.  No calf pain or swelling.  No pleurisy.  No nausea vomiting or diaphoresis.  He reports no chest pain with exertion.  Past Medical History:  Diagnosis Date  . Arthritis   . CAD (coronary artery disease)    cathx3 with nonobstructive disease. Last cath with RCA 40% stenosis in 2009  . Cataract 2019   bilateral; resolved with surgery  . Colonic polyp   . Fatty liver disease, nonalcoholic 2841   by [place]  . GERD (gastroesophageal reflux disease)   . History of chicken pox   . Hyperlipidemia   . Hypertension   . Nocturia   . OSA (obstructive sleep apnea)    CPAP, compliant  . Past use of tobacco    Quit 1990, 70 pack year history  . Rash of genital area    09-08-2014  per pt Dr Junious Silk aware  . Seasonal and perennial allergic rhinitis   . Type 2 diabetes mellitus ([place])   . Wears dentures    full upper/  partial lower  . Wears glasses     Patient Active Problem List   Diagnosis Date Noted  . Laceration of left little finger without foreign body without damage to nail 06/17/2018  . Osteoarthritis of knee 05/23/2017  . Cerumen impaction 05/20/2017  . Ex-smoker 03/27/2017  . Elevated PSA 03/27/2017  . Personal history of colonic polyps   . Stress due to family tension 05/15/2016  . Skin rash 05/15/2016  . Hx of colonic polyps   . Benign neoplasm of transverse colon   . Polyp of sigmoid colon   . Benign neoplasm of descending colon    . Chronic midline low back pain 02/27/2016  . Bilateral hip pain 11/02/2015  . Right knee pain 07/12/2015  . Health maintenance examination 03/07/2015  . Severe obesity (BMI 35.0-39.9) with comorbidity ([place]) 03/07/2015  . Advanced care planning/counseling discussion 03/07/2015  . Controlled diabetes mellitus type 2 with complications ([place]) 32/44/0102  . Dyslipidemia associated with type 2 diabetes mellitus ([place]) 05/06/2014  . OSA (obstructive sleep apnea) 05/06/2014  . Fatty liver disease, nonalcoholic 72/53/6644  . H/O malignant neoplasm of skin 01/01/2013  . HTN (hypertension) 02/15/2009  . CAD (coronary artery disease), native coronary artery 02/15/2009  . Shortness of breath 02/15/2009    Past Surgical History:  Procedure Laterality Date  . ABDOMINAL HERNIA REPAIR  2007      [facility]   open repair  . APPENDECTOMY  age 83  . CARDIAC CATHETERIZATION  12-23-2007   [facility]   Abnormal myoview w/ ischemia/  40% mRCA with nonobstructive and no sig. plaque in his left system, EF 55%  . CARDIAC CATHETERIZATION  Apr 2008    [facility]   Abnormal myoview/  50% RCA,  ef 65%  . CARDIAC CATHETERIZATION  1999      [facility]  . CATARACT EXTRACTION, BILATERAL Bilateral 11/2017  . CERVICAL FUSION  1992  . CHOLECYSTECTOMY OPEN  2006  . COLONOSCOPY  WITH PROPOFOL N/A 04/17/2016   TAs, high grade dysplasia with margins clear, diverticulosis Lucilla Lame, MD)  . COLONOSCOPY WITH PROPOFOL N/A 08/07/2016   TAx1, diverticulosis, rpt 3 yrs ([place])  . DENTAL SURGERY     metal dental implant L mandible  . EXCISION OF SKIN TAG Right 09/14/2014   Procedure: EXCISION OF SKIN TAG;  Surgeon: Festus Aloe, MD;  Location: [facility];  Service: Urology;  Laterality: Right;  . HYDROCELE EXCISION Left 09/14/2014   Procedure: LEFT HYDROCELECTOMY ADULT;  Surgeon: Festus Aloe, MD;  Location[facility];  Service: Urology;  Laterality: Left;  . MOHS SURGERY  2015   skin cancer  . TONSILLECTOMY   age 12    Prior to Admission medications   Medication Sig Start Date End Date Taking? Authorizing Provider  albuterol (PROVENTIL HFA;VENTOLIN HFA) 108 (90 Base) MCG/ACT inhaler INHALE 2 PUFFS BY MOUTH EVERY 6 HOURS AS NEEDED FOR WHEEZING 03/27/18   Ria Bush, MD  aspirin 81 MG EC tablet Take 1 tablet (81 mg total) by mouth daily. 09/17/14   Festus Aloe, MD  atenolol (TENORMIN) 25 MG tablet TAKE 1 TABLET BY MOUTH EVERY DAY 09/23/17   Minna Merritts, MD  atorvastatin (LIPITOR) 20 MG tablet TAKE 1 TABLET BY MOUTH EVERY DAY 04/09/18   Minna Merritts, MD  Blood Glucose Monitoring Suppl (BLOOD GLUCOSE MONITOR SYSTEM) w/Device KIT 1 each by Does not apply route daily. Use as directed to check blood sugar once daily 07/24/17   Ria Bush, MD  Cyanocobalamin (VITAMIN B 12 PO) Take 1 tablet by mouth daily.    [provider]  diclofenac sodium (VOLTAREN) 1 % GEL APPLY 2 G TOPICALLY 3 (THREE) TIMES DAILY AS NEEDED (ANTI INFLAMMATORY). 07/24/17   Ria Bush, MD  gabapentin (NEURONTIN) 300 MG capsule TAKE 1 CAPSULE BY MOUTH TWICE A DAY - 1 IN THE MORNING AND 1 AT BEDTIME ALONG WITH 100 MG CAPSULE 01/17/18   Hyatt, Max T, DPM  Glucose Blood (BLOOD GLUCOSE TEST STRIPS) STRP 1 each by In Vitro route daily. Use as directed to check blood sugar once daily 07/24/17   Ria Bush, MD  ibuprofen (ADVIL,MOTRIN) 200 MG tablet Take 400-600 mg by mouth every 6 (six) hours as needed for mild pain (DEPENDS ON PAIN IF TAKES 400-600 MG).     [provider]  Lancets MISC 1 each by Does not apply route daily. Use as directed to check blood sugar daily 07/24/17   Ria Bush, MD  losartan (COZAAR) 50 MG tablet Take 1 tablet (50 mg total) by mouth 2 (two) times daily. 07/21/18   Minna Merritts, MD  metFORMIN (GLUCOPHAGE) 1000 MG tablet TAKE 1 TABLET TWICE DAILY WITH A MEAL 10/16/17   Ria Bush, MD     Allergies Sulfa antibiotics  Family History  Problem  Relation Age of Onset  . CAD Mother        MI  . Diabetes Mother   . Stroke Mother        mini-stroke  . Cancer Father 91       lung (smoker)  . Heart disease Paternal Grandmother   . Heart disease Paternal Grandfather   . Diabetes Paternal Grandfather   . Cancer Sister        lung  . Coronary artery disease Neg Hx        Premature    Social History Social History   Tobacco Use  . Smoking status: Former Smoker  Packs/day: 2.00    Years: 35.00    Pack years: 70.00    Types: Cigarettes    Quit date: 01/29/1990    Years since quitting: 28.4  . Smokeless tobacco: Never Used  Substance Use Topics  . Alcohol use: No  . Drug use: No    Review of Systems  Constitutional: No fever/chills Eyes: No visual changes.  ENT: No sore throat. Cardiovascular: As above Respiratory: Denies shortness of breath. Gastrointestinal: No abdominal pain.  No nausea, no vomiting.   Genitourinary: Negative for dysuria. Musculoskeletal: Negative for back pain. Skin: Negative for rash. Neurological: Negative for headaches or weakness   ____________________________________________   PHYSICAL EXAM:  VITAL SIGNS: ED Triage Vitals  Enc Vitals Group     BP 07/23/18 1640 128/67     Pulse Rate 07/23/18 1640 67     Resp 07/23/18 1640 19     Temp 07/23/18 1640 98.5 F (36.9 C)     Temp Source 07/23/18 1640 Oral     SpO2 07/23/18 1640 97 %     Weight 07/23/18 1640 111.1 kg (245 lb)     Height 07/23/18 1640 1.829 m (6')     Head Circumference --      Peak Flow --      Pain Score 07/23/18 1658 2     Pain Loc --      Pain Edu? --      Excl. in [place]? --     Constitutional: Alert and oriented. No acute distress. Pleasant and interactive  Nose: No congestion/rhinnorhea. Mouth/Throat: Mucous membranes are moist.    Cardiovascular: Normal rate, regular rhythm. Grossly normal heart sounds.  Good peripheral circulation. Respiratory: Normal respiratory effort.  No retractions. Lungs CTAB.  Gastrointestinal: Soft and nontender. No distention.  No CVA tenderness. Genitourinary: deferred Musculoskeletal: No lower extremity tenderness nor edema.  Warm and well perfused Neurologic:  Normal speech and language. No gross focal neurologic deficits are appreciated.  Skin:  Skin is warm, dry and intact. No rash noted. Psychiatric: Mood and affect are normal. Speech and behavior are normal.  ____________________________________________   LABS (all labs ordered are listed, but only abnormal results are displayed)  Labs Reviewed  BASIC METABOLIC PANEL - Abnormal; Notable for the following components:      Result Value   Glucose, Bld 122 (*)    All other components within normal limits  CBC  TROPONIN I (HIGH SENSITIVITY)   ____________________________________________  EKG  ED ECG REPORT I, Lavonia Drafts, the attending physician, personally viewed and interpreted this ECG.  Date: 07/23/2018  Rhythm: normal sinus rhythm QRS Axis: normal Intervals: normal ST/T Wave abnormalities: normal Narrative Interpretation: no evidence of acute ischemia  ____________________________________________  RADIOLOGY  Chest x-ray unremarkable ____________________________________________   PROCEDURES  Procedure(s) performed: No  Procedures   Critical Care performed: No ____________________________________________   INITIAL IMPRESSION / ASSESSMENT AND PLAN / ED COURSE  Pertinent labs & imaging results that were available during my care of the patient were reviewed by me and considered in my medical decision making (see chart for details).  Patient well-appearing and in no acute distress, no discomfort at this time.  Lab work is quite reassuring, troponin is normal.  EKG is unremarkable.  Chest x-ray is benign.  Reviewed outpatient notes at cardiologist's office and recommended admission to the patient given concern of unstable angina however he stated that because he felt well and  initial work-up reassuring he did not think that was necessary as he would  be more comfortable at home.  Given his unremarkable work-up I feel that continued outpatient work-up is likely appropriate although admission would be preferable as he agrees to return immediately to the emergency department if any change in his symptoms.  We did discuss strict return precautions at length.    ____________________________________________   FINAL CLINICAL IMPRESSION(S) / ED DIAGNOSES  Final diagnoses:  Atypical chest pain        Note:  This document was prepared using Dragon voice recognition software and may include unintentional dictation errors.   Lavonia Drafts, MD 07/23/18 [id number]    Lavonia Drafts, MD 07/23/18 2240

## 2018-07-23 NOTE — Telephone Encounter (Signed)
Call to patient to speak about complaint.   Pt walked into office this morning with c/o L anterior chest pain since sat. Pt placed on the schedule for this afternoon to see Ignacia Bayley, NP.   I called to ask patient about nature of his chest pain. Woke him up from sleep on sat am. It has been getting better throughout the day. Has come back each morning. Is having some chest tightness but is able to work and do normal activities.   Has not taken any medication. Does report some SOB that is ongoing.       COVID-19 Pre-Screening Questions:  . In the past 7 to 10 days have you had a cough,  shortness of breath, headache, congestion, fever (100 or greater) body aches, chills, sore throat, or sudden loss of taste or sense of smell? No new sx  . Have you been around anyone with known Covid 19. No  . Have you been around anyone who is awaiting Covid 19 test results in the past 7 to 10 days? No  . Have you been around anyone who has been exposed to Covid 19, or has mentioned symptoms of Covid 19 within the past 7 to 10 days? no  If you have any concerns/questions about symptoms patients report during screening (either on the phone or at threshold). Contact the provider seeing the patient or DOD for further guidance.  If neither are available contact a member of the leadership team.       Reviewed clinic procedures for CV 19.

## 2018-07-24 ENCOUNTER — Encounter: Payer: Self-pay | Admitting: *Deleted

## 2018-07-24 DIAGNOSIS — R079 Chest pain, unspecified: Secondary | ICD-10-CM

## 2018-07-24 NOTE — Telephone Encounter (Signed)
-----   Message from Arvil Chaco, PA-C sent at 07/24/2018 12:46 PM EDT ----- Regarding: Follow-up after 6/24 ED Visit Please call patient and ask if you would like for [place] to set him up for a Lexiscan stress test and as an outpatient, since he left the ED yesterday.   If he is agreement, please schedule him for a Lexiscan stress test. We are not yet doing treadmills due to COVID-19 precautions.  Please also schedule him for follow-up with Dr. Rockey Situ after the stress test and to review the results of this workup.    I have Cc'd Dr. Rockey Situ on this message to make him aware that we will be calling the patient.   Thank you, JV  ----- Message ----- From: Vanessa Ralphs, RN Sent: 07/24/2018  11:15 AM EDT To: Arvil Chaco, PA-C  Jacquelyn,  Does it seem reasonable to go ahead and have patient seen again in the office to discuss next steps?  Thanks, Anderson Malta, RN ----- Message ----- From: Minna Merritts, MD Sent: 07/24/2018  10:43 AM EDT To: Rebeca Alert Burl Triage    ----- Message ----- From: Arvil Chaco, PA-C Sent: 07/23/2018   5:45 PM EDT To: Minna Merritts, MD

## 2018-07-24 NOTE — Progress Notes (Signed)
Can we follow up after ER Chest pain, enz neg, did not stay in hosp, everything looked ok Does he want a stress test? thx TG

## 2018-07-24 NOTE — Telephone Encounter (Signed)
Spoke with patient. He is agreeable to the stress test. He is currently at work and cannot schedule right now because he is working at [employer] He will call us back tomorrow.  Routing to Scheduling as well.  After he is scheduled. Nursing will need to go over preprocedural instructions. Thanks!

## 2018-07-24 NOTE — Progress Notes (Signed)
Please let me know when order is in I can call patient .    Thanks

## 2018-07-28 NOTE — Telephone Encounter (Signed)
This encounter was created in error - please disregard.

## 2018-07-28 NOTE — Telephone Encounter (Signed)
Patient scheduled for Lexiscan. He verbalized understanding of the instructions as listed below.    [place]  Your caregiver has ordered a Stress Test with nuclear imaging. The purpose of this test is to evaluate the blood supply to your heart muscle. This procedure is referred to as a "Non-Invasive Stress Test." This is because other than having an IV started in your vein, nothing is inserted or "invades" your body. Cardiac stress tests are done to find areas of poor blood flow to the heart by determining the extent of coronary artery disease (CAD). Some patients exercise on a treadmill, which naturally increases the blood flow to your heart, while others who are  unable to walk on a treadmill due to physical limitations have a pharmacologic/chemical stress agent called Lexiscan . This medicine will mimic walking on a treadmill by temporarily increasing your coronary blood flow.   Please note: these test may take anywhere between 2-4 hours to complete  PLEASE REPORT TO [place] AT THE FIRST DESK WILL DIRECT YOU WHERE TO GO  Date of Procedure:_________07/06/2020______________  Arrival Time for Procedure:____07:45 am__________  Instructions regarding medication:   _X_ : Hold diabetes medication morning of procedure - METFORMIN  _X_:  Hold betablocker(ATENOLOL) night before procedure and morning of procedure   PLEASE NOTIFY THE OFFICE AT LEAST 24 HOURS IN ADVANCE IF YOU ARE UNABLE TO KEEP YOUR APPOINTMENT.  [phone] AND  PLEASE NOTIFY NUCLEAR MEDICINE AT [facility] AT LEAST 24 HOURS IN ADVANCE IF YOU ARE UNABLE TO KEEP YOUR APPOINTMENT. [phone]  How to prepare for your Myoview test:  1. Do not eat or drink after midnight 2. No caffeine for 24 hours prior to test 3. No smoking 24 hours prior to test. 4. Your medication may be taken with water.  If your doctor stopped a medication because of this test, do not take that  medication. 5. Pants are appropriate. Please wear a short sleeve shirt. 6. No perfume, cologne or lotion. 7. Wear comfortable walking shoes.

## 2018-07-30 ENCOUNTER — Ambulatory Visit: Payer: 59 | Attending: Student

## 2018-07-30 ENCOUNTER — Telehealth: Payer: Self-pay | Admitting: *Deleted

## 2018-07-30 DIAGNOSIS — M6281 Muscle weakness (generalized): Secondary | ICD-10-CM | POA: Insufficient documentation

## 2018-07-30 DIAGNOSIS — M545 Low back pain: Secondary | ICD-10-CM | POA: Insufficient documentation

## 2018-07-30 DIAGNOSIS — G8929 Other chronic pain: Secondary | ICD-10-CM | POA: Insufficient documentation

## 2018-07-30 NOTE — Telephone Encounter (Signed)
Telephone Encounter by Vanessa Ralphs, RN at 07/28/2018 8:56 AM Author: Vanessa Ralphs, RN Author Type: Registered Nurse Filed: 07/28/2018 9:02 AM  Note Status: Signed Cosign: Cosign Not Required Encounter Date: 07/24/2018  Editor: Vanessa Ralphs, RN (Registered Nurse)    Patient scheduled for Casey Golden. He verbalized understanding of the instructions as listed below.    [place]  Your caregiver has ordered a Stress Test with nuclear imaging. The purpose of this test is to evaluate the blood supply to your heart muscle. This procedure is referred to as a "Non-Invasive Stress Test." This is because other than having an IV started in your vein, nothing is inserted or "invades" your body. Cardiac stress tests are done to find areas of poor blood flow to the heart by determining the extent of coronary artery disease (CAD). Some patients exercise on a treadmill, which naturally increases the blood flow to your heart, while others who are  unable to walk on a treadmill due to physical limitations have a pharmacologic/chemical stress agent called Lexiscan . This medicine will mimic walking on a treadmill by temporarily increasing your coronary blood flow.   Please note: these test may take anywhere between 2-4 hours to complete  PLEASE REPORT TO [place] AT THE FIRST DESK WILL DIRECT YOU WHERE TO GO  Date of Procedure:_________07/06/2020______________  Arrival Time for Procedure:____07:45 am__________  Instructions regarding medication:   _X_ : Hold diabetes medication morning of procedure - METFORMIN  _X_:  Hold betablocker(ATENOLOL) night before procedure and morning of procedure   PLEASE NOTIFY THE OFFICE AT LEAST 24 HOURS IN ADVANCE IF YOU ARE UNABLE TO KEEP YOUR APPOINTMENT.  [phone] AND  PLEASE NOTIFY NUCLEAR MEDICINE AT [facility] AT LEAST 24 HOURS IN ADVANCE IF YOU ARE UNABLE TO KEEP YOUR APPOINTMENT. [phone]  How  to prepare for your Myoview test:  1. Do not eat or drink after midnight 2. No caffeine for 24 hours prior to test 3. No smoking 24 hours prior to test. 4. Your medication may be taken with water.  If your doctor stopped a medication because of this test, do not take that medication. 5. Pants are appropriate. Please wear a short sleeve shirt. 6. No perfume, cologne or lotion. 7. Wear comfortable walking shoes.                                                Telephone Encounter by Lucienne Minks F at 07/28/2018 8:53 AM Author: Blain Pais Author Type: Clinical Support Staff Filed: 07/28/2018 9:02 AM  Note Status: Signed Cosign: Cosign Not Required Encounter Date: 07/24/2018  Editor: Blain Pais (Clinical Support Staff)     This encounter was created in error - please disregard.    Telephone Encounter by Vanessa Ralphs, RN at 07/24/2018 4:15 PM Author: Vanessa Ralphs, RN Author Type: Registered Nurse Filed: 07/24/2018 4:33 PM  Note Status: Signed Cosign: Cosign Not Required Encounter Date: 07/24/2018  Editor: Vanessa Ralphs, RN (Registered Nurse)    Spoke with patient. He is agreeable to the stress test. He is currently at work and cannot schedule right now because he is working at [employer] He will call us back tomorrow.  Routing to Scheduling as well.  After he is scheduled. Nursing will need to go over preprocedural instructions. Thanks!  Telephone Encounter by Vanessa Ralphs, RN at 07/24/2018 2:47 PM Author: Vanessa Ralphs, RN Author Type: Registered Nurse Filed: 07/24/2018 2:47 PM  Note Status: Signed Cosign: Cosign Not Required Encounter Date: 07/24/2018  Editor: Vanessa Ralphs, RN (Registered Nurse)    ----- Message from Arvil Chaco, PA-C sent at 07/24/2018 12:46 PM EDT ----- Regarding: Follow-up after 6/24 ED Visit Please call patient and ask if you would like for [place] to set him up for a Lexiscan stress test and as an  outpatient, since he left the ED yesterday.   If he is agreement, please schedule him for a Lexiscan stress test. We are not yet doing treadmills due to COVID-19 precautions.  Please also schedule him for follow-up with Dr. Rockey Situ after the stress test and to review the results of this workup.    I have Cc'd Dr. Rockey Situ on this message to make him aware that we will be calling the patient.   Thank you, JV  ----- Message ----- From: Vanessa Ralphs, RN Sent: 07/24/2018  11:15 AM EDT To: Arvil Chaco, PA-C  Jacquelyn,  Does it seem reasonable to go ahead and have patient seen again in the office to discuss next steps?  Thanks, Anderson Malta, RN ----- Message ----- From: Minna Merritts, MD Sent: 07/24/2018  10:43 AM EDT To: Rebeca Alert Burl Triage    ----- Message ----- From: Arvil Chaco, PA-C Sent: 07/23/2018   5:45 PM EDT To: Minna Merritts, MD

## 2018-07-31 ENCOUNTER — Other Ambulatory Visit: Payer: Self-pay

## 2018-07-31 ENCOUNTER — Ambulatory Visit (INDEPENDENT_AMBULATORY_CARE_PROVIDER_SITE_OTHER): Payer: 59 | Admitting: Podiatry

## 2018-07-31 ENCOUNTER — Encounter: Payer: Self-pay | Admitting: Podiatry

## 2018-07-31 ENCOUNTER — Ambulatory Visit: Payer: Medicare Other | Admitting: Podiatry

## 2018-07-31 DIAGNOSIS — M79675 Pain in left toe(s): Secondary | ICD-10-CM

## 2018-07-31 DIAGNOSIS — B351 Tinea unguium: Secondary | ICD-10-CM

## 2018-07-31 DIAGNOSIS — M79674 Pain in right toe(s): Secondary | ICD-10-CM

## 2018-07-31 DIAGNOSIS — E118 Type 2 diabetes mellitus with unspecified complications: Secondary | ICD-10-CM

## 2018-07-31 NOTE — Progress Notes (Signed)
Complaint:  Visit Type: Patient returns to my office for continued preventative foot care services. Complaint: Patient states" my nails have grown long and thick and become painful to walk and wear shoes" Patient has been diagnosed with DM with neuropathy. The patient presents for preventative foot care services. No changes to ROS  Podiatric Exam: Vascular: dorsalis pedis and posterior tibial pulses are palpable bilateral. Capillary return is immediate. Temperature gradient is WNL. Skin turgor WNL  Sensorium: Absent  Semmes Weinstein monofilament test. Diminished  tactile sensation bilaterally. Nail Exam: Pt has thick disfigured discolored nails with subungual debris noted bilateral entire nail hallux through fifth toenails Ulcer Exam: There is no evidence of ulcer or pre-ulcerative changes or infection. Orthopedic Exam: Muscle tone and strength are WNL. No limitations in general ROM. No crepitus or effusions noted. HAV with hallux malleus. Tailors Bunion  B/L Skin: No Porokeratosis. No infection or ulcers  Diagnosis:  Onychomycosis, , Pain in right toe, pain in left toes,  DPN  HAV with hallux malleus.  Tailors bunion  B/L  Treatment & Plan Procedures and Treatment: Consent by patient was obtained for treatment procedures. The patient understood the discussion of treatment and procedures well. All questions were answered thoroughly reviewed. Debridement of mycotic and hypertrophic toenails, 1 through 5 bilateral and clearing of subungual debris. No ulceration, no infection noted. Patient is to see Liliane Channel for new diabetic shoes. Return Visit-Office Procedure: Patient instructed to return to the office for a follow up visit 3 months for continued evaluation and treatment.    Gardiner Barefoot DPM

## 2018-08-01 NOTE — Progress Notes (Signed)
I reviewed health advisor's note, was available for consultation, and agree with documentation and plan.  

## 2018-08-04 ENCOUNTER — Other Ambulatory Visit: Payer: 59

## 2018-08-05 ENCOUNTER — Ambulatory Visit: Payer: 59

## 2018-08-06 ENCOUNTER — Other Ambulatory Visit: Payer: 59

## 2018-08-12 ENCOUNTER — Ambulatory Visit: Payer: 59

## 2018-08-12 ENCOUNTER — Other Ambulatory Visit: Payer: Self-pay

## 2018-08-12 DIAGNOSIS — M6281 Muscle weakness (generalized): Secondary | ICD-10-CM | POA: Diagnosis not present

## 2018-08-12 DIAGNOSIS — G8929 Other chronic pain: Secondary | ICD-10-CM | POA: Diagnosis present

## 2018-08-12 DIAGNOSIS — M545 Low back pain: Secondary | ICD-10-CM | POA: Diagnosis present

## 2018-08-12 NOTE — Therapy (Signed)
[facility] [address], [place], [address] Phone: [phone]   Fax:  [phone]  Physical Therapy Treatment  Patient Details  Name: Casey Golden MRN: [id number] Date of Birth: April 21, 1950 Referring Provider (PT): Marin Olp PA   Encounter Date: 08/12/2018  PT End of Session - 08/12/18 1356    Visit Number  5    Number of Visits  13    Date for PT Re-Evaluation  09/23/18    Authorization Type  5 / 10 G Code    PT Start Time  0900    PT Stop Time  0945    PT Time Calculation (min)  45 min    Activity Tolerance  Patient tolerated treatment well    Behavior During Therapy  [facility] for tasks assessed/performed       Past Medical History:  Diagnosis Date  . Arthritis   . CAD (coronary artery disease)    cathx3 with nonobstructive disease. Last cath with RCA 40% stenosis in 2009  . Cataract 2019   bilateral; resolved with surgery  . Colonic polyp   . Fatty liver disease, nonalcoholic 0626   by [place]  . GERD (gastroesophageal reflux disease)   . History of chicken pox   . Hyperlipidemia   . Hypertension   . Nocturia   . OSA (obstructive sleep apnea)    CPAP, compliant  . Past use of tobacco    Quit 1990, 70 pack year history  . Rash of genital area    09-08-2014  per pt Dr Junious Silk aware  . Seasonal and perennial allergic rhinitis   . Type 2 diabetes mellitus ([place])   . Wears dentures    full upper/  partial lower  . Wears glasses     Past Surgical History:  Procedure Laterality Date  . ABDOMINAL HERNIA REPAIR  2007      [facility]   open repair  . APPENDECTOMY  age 64  . CARDIAC CATHETERIZATION  12-23-2007   [facility]   Abnormal myoview w/ ischemia/  40% mRCA with nonobstructive and no sig. plaque in his left system, EF 55%  . CARDIAC CATHETERIZATION  Apr 2008    [facility]   Abnormal myoview/  50% RCA,  ef 65%  . CARDIAC CATHETERIZATION  1999      [facility]  . CATARACT EXTRACTION, BILATERAL Bilateral  11/2017  . CERVICAL FUSION  1992  . CHOLECYSTECTOMY OPEN  2006  . COLONOSCOPY WITH PROPOFOL N/A 04/17/2016   TAs, high grade dysplasia with margins clear, diverticulosis Lucilla Lame, MD)  . COLONOSCOPY WITH PROPOFOL N/A 08/07/2016   TAx1, diverticulosis, rpt 3 yrs ([facility])  . DENTAL SURGERY     metal dental implant L mandible  . EXCISION OF SKIN TAG Right 09/14/2014   Procedure: EXCISION OF SKIN TAG;  Surgeon: Festus Aloe, MD;  Location: [facility];  Service: Urology;  Laterality: Right;  . HYDROCELE EXCISION Left 09/14/2014   Procedure: LEFT HYDROCELECTOMY ADULT;  Surgeon: Festus Aloe, MD;  Location: [facility];  Service: Urology;  Laterality: Left;  . MOHS SURGERY  2015   skin cancer  . TONSILLECTOMY  age 16    There were no vitals filed for this visit.  Subjective Assessment - 08/12/18 1354    Subjective  Patient reports she has been improving significantly and reports his pain has been less intense. Patient states walking has been easier.    Pertinent History  Chronic LBP -  previously seen for PT, DMII    How long can you walk comfortably?  10 min    Patient Stated Goals  To decrease pain    Currently in Pain?  No/denies    Pain Onset  More than a month ago         TREATMENT Therapeutic Exercise:  Standing lumbar extension -- x 20 Lumbar extension prone on elbow -- x 20  Side bending CKC in standing -- x 20  Amb backwards with focus on improving stride length -- x 20  Forward ambulation with focus on taking long strides and improve speed -- x 20  Hip extension in standing with performing lumbar extension -- x 20  Side bending with lumbar CKC in standing -- x 20 B   Manual therapy: STM performed to the multifidi B focused on decreasing pain and spasms with patient positioned in prone -- x 1min  Patient demonstrates improvement at the end of the session. Performed exercises to improve hip/lumbar extension   PT Education - 08/12/18  1356    Education Details  form/technique with exercise    Person(s) Educated  Patient    Methods  Explanation;Demonstration    Comprehension  Verbalized understanding;Returned demonstration          PT Long Term Goals - 08/12/18 0954      PT LONG TERM GOAL #1   Title  Patient will be independent with HEP to continue benefits of therapy until after discharge.     Baseline  dependent; 08/12/2018: moderate cueing for exercise    Time  6    Period  Weeks    Status  On-going      PT LONG TERM GOAL #2   Title  Patient will improve 5XSTS to <16 sec to demonstrate significant improvement in functional LE strength and improved ability to transfer    Baseline  5XSTS: 55sec; 08/12/2018: 20.8 sec    Time  6    Period  Weeks    Status  On-going      PT LONG TERM GOAL #3   Title  Patient will have a worse pain in the back/LE to 2/10 in the past 2 weeks to demonstrate significant improvement in pain level and better ability to walk.     Baseline  worse pain: 5/10    Time  6    Period  Weeks    Status  New            Plan - 08/12/18 1358    Clinical Impression Statement  Patient is making progress towards long term goals with greater improvement in pain and improvement in 5xSTS indicating functional carryover between sessions. Although patient is improving, he continues to have limitations with walking for prolonged periods of time. Patient demonstrates overall improvement but continues to have increased difficulty with walking for prolonged periods of time. Patient will benefit from further skilled therapy to return to prior level of function.    Personal Factors and Comorbidities  Age;Comorbidity 1    Comorbidities  DMII, Chronic pain     Examination-Activity Limitations  Carry;Bend;Lift;Stand    Examination-Participation Restrictions  Community Activity;Other   walking   Stability/Clinical Decision Making  Evolving/Moderate complexity    Rehab Potential  Good    PT Frequency  2x /  week    PT Duration  6 weeks    PT Treatment/Interventions  Patient/family education;Therapeutic exercise;Therapeutic activities;Neuromuscular re-education;Stair training;Electrical Stimulation;Moist Heat;Manual techniques;Dry needling;Scar mobilization;Joint Manipulations;Spinal Manipulations;Passive range of motion  PT Next Visit Plan  continue extension based exercise    PT Home Exercise Plan  see education section    Consulted and Agree with Plan of Care  Patient       Patient will benefit from skilled therapeutic intervention in order to improve the following deficits and impairments:  Abnormal gait, Decreased balance, Decreased endurance, Decreased mobility, Difficulty walking, Increased muscle spasms, Decreased range of motion, Obesity, Decreased activity tolerance, Decreased strength, Pain  Visit Diagnosis: 1. Muscle weakness (generalized)   2. Chronic bilateral low back pain, unspecified whether sciatica present        Problem List Patient Active Problem List   Diagnosis Date Noted  . Pain due to onychomycosis of toenails of both feet 07/31/2018  . Laceration of left little finger without foreign body without damage to nail 06/17/2018  . Osteoarthritis of knee 05/23/2017  . Cerumen impaction 05/20/2017  . Ex-smoker 03/27/2017  . Elevated PSA 03/27/2017  . Personal history of colonic polyps   . Stress due to family tension 05/15/2016  . Skin rash 05/15/2016  . Hx of colonic polyps   . Benign neoplasm of transverse colon   . Polyp of sigmoid colon   . Benign neoplasm of descending colon   . Chronic midline low back pain 02/27/2016  . Bilateral hip pain 11/02/2015  . Right knee pain 07/12/2015  . Health maintenance examination 03/07/2015  . Severe obesity (BMI 35.0-39.9) with comorbidity ([place]) 03/07/2015  . Advanced care planning/counseling discussion 03/07/2015  . Controlled diabetes mellitus type 2 with complications ([place]) 81/15/7262  . Dyslipidemia associated with  type 2 diabetes mellitus ([place]) 05/06/2014  . OSA (obstructive sleep apnea) 05/06/2014  . Fatty liver disease, nonalcoholic 03/55/9741  . H/O malignant neoplasm of skin 01/01/2013  . HTN (hypertension) 02/15/2009  . CAD (coronary artery disease), native coronary artery 02/15/2009  . Shortness of breath 02/15/2009    Blythe Stanford, PT DPT 08/12/2018, 2:53 PM  [facility] [address], [place], [address] Phone: [phone]   Fax:  [phone]  Name: Casey Golden MRN: [id number] Date of Birth: September 29, 1950

## 2018-08-14 ENCOUNTER — Other Ambulatory Visit: Payer: Self-pay

## 2018-08-14 ENCOUNTER — Ambulatory Visit: Payer: 59

## 2018-08-14 DIAGNOSIS — M6281 Muscle weakness (generalized): Secondary | ICD-10-CM

## 2018-08-14 DIAGNOSIS — G8929 Other chronic pain: Secondary | ICD-10-CM

## 2018-08-14 NOTE — Therapy (Signed)
[facility] [address] [place], [address] Phone: [phone]   Fax:  [phone]  Physical Therapy Treatment  Patient Details  Name: Casey Golden MRN: [id number] Date of Birth: Sep 19, 1950 Referring Provider (PT): Marin Olp PA   Encounter Date: 08/14/2018  PT End of Session - 08/14/18 0747    Visit Number  6    Number of Visits  13    Date for PT Re-Evaluation  09/23/18    Authorization Type  6/10 G Code    PT Start Time  0730    PT Stop Time  0815    PT Time Calculation (min)  45 min       Past Medical History:  Diagnosis Date   Arthritis    CAD (coronary artery disease)    cathx3 with nonobstructive disease. Last cath with RCA 40% stenosis in 2009   Cataract 2019   bilateral; resolved with surgery   Colonic polyp    Fatty liver disease, nonalcoholic 1194   by [place]   GERD (gastroesophageal reflux disease)    History of chicken pox    Hyperlipidemia    Hypertension    Nocturia    OSA (obstructive sleep apnea)    CPAP, compliant   Past use of tobacco    Quit 1990, 70 pack year history   Rash of genital area    09-08-2014  per pt Dr Junious Silk aware   Seasonal and perennial allergic rhinitis    Type 2 diabetes mellitus ([place])    Wears dentures    full upper/  partial lower   Wears glasses     Past Surgical History:  Procedure Laterality Date   ABDOMINAL HERNIA REPAIR  2007      [facility]   open repair   APPENDECTOMY  age 53   CARDIAC CATHETERIZATION  12-23-2007   [facility]   Abnormal myoview w/ ischemia/  40% mRCA with nonobstructive and no sig. plaque in his left system, EF 55%   CARDIAC CATHETERIZATION  Apr 2008    [facility]   Abnormal myoview/  50% RCA,  ef 65%   CARDIAC CATHETERIZATION  1999      [facility]   CATARACT EXTRACTION, BILATERAL Bilateral 11/2017   CERVICAL FUSION  1992   CHOLECYSTECTOMY OPEN  2006   COLONOSCOPY WITH PROPOFOL N/A 04/17/2016   TAs, high  grade dysplasia with margins clear, diverticulosis Lucilla Lame, MD)   COLONOSCOPY WITH PROPOFOL N/A 08/07/2016   TAx1, diverticulosis, rpt 3 yrs ([facility])   DENTAL SURGERY     metal dental implant L mandible   EXCISION OF SKIN TAG Right 09/14/2014   Procedure: EXCISION OF SKIN TAG;  Surgeon: Festus Aloe, MD;  Location: [facility];  Service: Urology;  Laterality: Right;   HYDROCELE EXCISION Left 09/14/2014   Procedure: LEFT HYDROCELECTOMY ADULT;  Surgeon: Festus Aloe, MD;  Location: [facility];  Service: Urology;  Laterality: Left;   MOHS SURGERY  2015   skin cancer   TONSILLECTOMY  age 62    There were no vitals filed for this visit.  Subjective Assessment - 08/14/18 0733    Subjective  Patient reports he has been improving significantly and reports his pain has been decreased. He reports being able to walk a total of about 4 miles the past couple of days. Patient states walking feels the same or a touch easier.    Pertinent History  Chronic LBP - previously seen for  PT, DMII    How long can you walk comfortably?  10 min    Patient Stated Goals  To decrease pain    Currently in Pain?  No/denies    Pain Score  0-No pain    Pain Onset  More than a month ago          Manual Therapy to address and improve his pain response and reduce muscular spasms. STM to bilateral, superficial lumbosacral region, superiorly and M/L directions, with patient positioned in prone  Therapeutic Exercise to address and improve his strength and activity tolerance with mobility.  Standing lumbar ext x20 Lumbar ext prone on elbow x20 Side bending CKC in standing x20  Hip ext in standing with lumbar ext x20 STS 2x12 with one UE support on right thigh Amb backwards stride length x6 ~59ft each way Amb 400 feet x4 with cues to increase stride length  Gait Training to improve his gait mechanics and activity tolerance.     PT Education - 08/14/18 0747     Education Details  form/technique with exercise    Person(s) Educated  Patient    Methods  Explanation;Demonstration    Comprehension  Verbalized understanding;Returned demonstration          PT Long Term Goals - 08/12/18 0954      PT LONG TERM GOAL #1   Title  Patient will be independent with HEP to continue benefits of therapy until after discharge.     Baseline  dependent; 08/12/2018: moderate cueing for exercise    Time  6    Period  Weeks    Status  On-going      PT LONG TERM GOAL #2   Title  Patient will improve 5XSTS to <16 sec to demonstrate significant improvement in functional LE strength and improved ability to transfer    Baseline  5XSTS: 55sec; 08/12/2018: 20.8 sec    Time  6    Period  Weeks    Status  On-going      PT LONG TERM GOAL #3   Title  Patient will have a worse pain in the back/LE to 2/10 in the past 2 weeks to demonstrate significant improvement in pain level and better ability to walk.     Baseline  worse pain: 5/10    Time  6    Period  Weeks    Status  New            Plan - 08/14/18 0915    Clinical Impression Statement  Patient is making progress towards long term goals, reporting continued resolution of pain and sx, with improved overall function. Although patient is improving, he continues to have limitations with walking for prolonged periods of time. Patient will benefit from further skilled therapy to return to prior level of function.    Personal Factors and Comorbidities  Age;Comorbidity 1    Comorbidities  DMII, Chronic pain     Examination-Activity Limitations  Carry;Bend;Lift;Stand    Examination-Participation Restrictions  Community Activity;Other   walking   Stability/Clinical Decision Making  Evolving/Moderate complexity    Clinical Decision Making  Low    Rehab Potential  Good    PT Frequency  2x / week    PT Duration  6 weeks    PT Treatment/Interventions  Patient/family education;Therapeutic exercise;Therapeutic  activities;Neuromuscular re-education;Stair training;Electrical Stimulation;Moist Heat;Manual techniques;Dry needling;Scar mobilization;Joint Manipulations;Spinal Manipulations;Passive range of motion    PT Next Visit Plan  continue extension based exercise    PT Home Exercise  Plan  see education section    Consulted and Agree with Plan of Care  Patient       Patient will benefit from skilled therapeutic intervention in order to improve the following deficits and impairments:  Abnormal gait, Decreased balance, Decreased endurance, Decreased mobility, Difficulty walking, Increased muscle spasms, Decreased range of motion, Obesity, Decreased activity tolerance, Decreased strength, Pain  Visit Diagnosis: 1. Muscle weakness (generalized)   2. Chronic bilateral low back pain, unspecified whether sciatica present        Problem List Patient Active Problem List   Diagnosis Date Noted   Pain due to onychomycosis of toenails of both feet 07/31/2018   Laceration of left little finger without foreign body without damage to nail 06/17/2018   Osteoarthritis of knee 05/23/2017   Cerumen impaction 05/20/2017   Ex-smoker 03/27/2017   Elevated PSA 03/27/2017   Personal history of colonic polyps    Stress due to family tension 05/15/2016   Skin rash 05/15/2016   Hx of colonic polyps    Benign neoplasm of transverse colon    Polyp of sigmoid colon    Benign neoplasm of descending colon    Chronic midline low back pain 02/27/2016   Bilateral hip pain 11/02/2015   Right knee pain 07/12/2015   Health maintenance examination 03/07/2015   Severe obesity (BMI 35.0-39.9) with comorbidity ([place]) 03/07/2015   Advanced care planning/counseling discussion 03/07/2015   Controlled diabetes mellitus type 2 with complications ([place]) 37/90/2409   Dyslipidemia associated with type 2 diabetes mellitus ([place]) 05/06/2014   OSA (obstructive sleep apnea) 05/06/2014   Fatty liver disease,  nonalcoholic 73/53/2992   H/O malignant neoplasm of skin 01/01/2013   HTN (hypertension) 02/15/2009   CAD (coronary artery disease), native coronary artery 02/15/2009   Shortness of breath 02/15/2009    Scarlette Calico, SPT 08/14/2018, 9:22 AM  [facility] [address], [place], [address] Phone: [phone]   Fax:  [phone]  Name: Casey Golden MRN: [id number] Date of Birth: 06/30/1950

## 2018-08-19 ENCOUNTER — Ambulatory Visit: Payer: 59

## 2018-08-19 DIAGNOSIS — G8929 Other chronic pain: Secondary | ICD-10-CM

## 2018-08-19 DIAGNOSIS — M6281 Muscle weakness (generalized): Secondary | ICD-10-CM | POA: Diagnosis not present

## 2018-08-19 NOTE — Therapy (Signed)
[facility] [address], [place], [address] Phone: [phone]   Fax:  [phone]  Physical Therapy Treatment  Patient Details  Name: Casey Golden MRN: [id number] Date of Birth: 1950-10-31 Referring Provider (PT): Marin Olp PA   Encounter Date: 08/19/2018  PT End of Session - 08/19/18 1329    Visit Number  7    Number of Visits  13    Date for PT Re-Evaluation  09/23/18    Authorization Type  6/10 G Code    PT Start Time  0815    PT Stop Time  0900    PT Time Calculation (min)  45 min    Activity Tolerance  Patient tolerated treatment well;No increased pain    Behavior During Therapy  WFL for tasks assessed/performed       Past Medical History:  Diagnosis Date   Arthritis    CAD (coronary artery disease)    cathx3 with nonobstructive disease. Last cath with RCA 40% stenosis in 2009   Cataract 2019   bilateral; resolved with surgery   Colonic polyp    Fatty liver disease, nonalcoholic 2637   by [place]   GERD (gastroesophageal reflux disease)    History of chicken pox    Hyperlipidemia    Hypertension    Nocturia    OSA (obstructive sleep apnea)    CPAP, compliant   Past use of tobacco    Quit 1990, 70 pack year history   Rash of genital area    09-08-2014  per pt Dr Junious Silk aware   Seasonal and perennial allergic rhinitis    Type 2 diabetes mellitus ([place])    Wears dentures    full upper/  partial lower   Wears glasses     Past Surgical History:  Procedure Laterality Date   ABDOMINAL HERNIA REPAIR  2007      [place]   open repair   APPENDECTOMY  age 6   CARDIAC CATHETERIZATION  12-23-2007   [place]   Abnormal myoview w/ ischemia/  40% mRCA with nonobstructive and no sig. plaque in his left system, EF 55%   CARDIAC CATHETERIZATION  Apr 2008    [facility]   Abnormal myoview/  50% RCA,  ef 65%   CARDIAC CATHETERIZATION  1999      [facility]   CATARACT EXTRACTION, BILATERAL  Bilateral 11/2017   CERVICAL FUSION  1992   CHOLECYSTECTOMY OPEN  2006   COLONOSCOPY WITH PROPOFOL N/A 04/17/2016   TAs, high grade dysplasia with margins clear, diverticulosis Lucilla Lame, MD)   COLONOSCOPY WITH PROPOFOL N/A 08/07/2016   TAx1, diverticulosis, rpt 3 yrs ([facility])   DENTAL SURGERY     metal dental implant L mandible   EXCISION OF SKIN TAG Right 09/14/2014   Procedure: EXCISION OF SKIN TAG;  Surgeon: Festus Aloe, MD;  Location: [facility];  Service: Urology;  Laterality: Right;   HYDROCELE EXCISION Left 09/14/2014   Procedure: LEFT HYDROCELECTOMY ADULT;  Surgeon: Festus Aloe, MD;  Location[facility];  Service: Urology;  Laterality: Left;   MOHS SURGERY  2015   skin cancer   TONSILLECTOMY  age 68    There were no vitals filed for this visit.  Subjective Assessment - 08/19/18 0815    Subjective  Patient reports he has been improving significantly and reports his pain has been decreased (1-2/10). He reports being able to walk about a mile at a time. Patient states no  major changes.    Pertinent History  Chronic LBP - previously seen for PT, DMII    How long can you walk comfortably?  10 min    Patient Stated Goals  To decrease pain    Currently in Pain?  Yes    Pain Score  2     Pain Location  Back    Pain Type  Chronic pain    Pain Onset  More than a month ago       [facility] Adult PT Treatment/Exercise - 08/19/18 7124      Ambulation/Gait   Ambulation/Gait  Yes    Gait Pattern  Step-through pattern;Decreased arm swing - left;Lateral trunk lean to right;Wide base of support    Ambulation Surface  Level    Gait Comments  Wide BOS, B hip ER,, decreased hip extension, forward lean      Manual Therapy   Manual Therapy  Joint mobilization;Soft tissue mobilization    Manual therapy comments  STM to bilateral, superficial lumbosacral region, superiorly and M/L directions, and joint PA mobilizations L1-S2, with patient  positioned in prone.       Manual Therapy to improve muscle spasms and pain response.  STM to bilateral, superficial lumbosacral region, superiorly and M/L directions, and joint PA mobilizations L1-S2, with patient positioned in prone.    Therapeutic Exercise to address and improve his strength and activity tolerance with mobility.   Standing lumbar extension x20 Side bending CKC in standing x20 with OP Hip ext in standing with lumbar ext x20 STS 2x12 without UE normal speed, explosive (without UE support) for second set to improve lower body power [place] Carry 20 lb, 200 feet Suitcase Carry 40 lb, 400 feet  Performed exercises to improve lumbar spine mobility, pain response, lower body strength/power, as well as to address his lateral shift. At the end of the session, pt reported 0/10 pain.      PT Education - 08/19/18 1329    Education Details  Form/technique. Add suitcase carry to HEP.    Person(s) Educated  Patient    Methods  Explanation;Demonstration;Tactile cues;Verbal cues    Comprehension  Verbalized understanding;Tactile cues required;Returned demonstration;Verbal cues required          PT Long Term Goals - 08/12/18 0954      PT LONG TERM GOAL #1   Title  Patient will be independent with HEP to continue benefits of therapy until after discharge.     Baseline  dependent; 08/12/2018: moderate cueing for exercise    Time  6    Period  Weeks    Status  On-going      PT LONG TERM GOAL #2   Title  Patient will improve 5XSTS to <16 sec to demonstrate significant improvement in functional LE strength and improved ability to transfer    Baseline  5XSTS: 55sec; 08/12/2018: 20.8 sec    Time  6    Period  Weeks    Status  On-going      PT LONG TERM GOAL #3   Title  Patient will have a worse pain in the back/LE to 2/10 in the past 2 weeks to demonstrate significant improvement in pain level and better ability to walk.     Baseline  worse pain: 5/10    Time  6     Period  Weeks    Status  New            Plan - 08/19/18 1333    Clinical Impression  Statement  Patient continued to make progress, reporting continued resolution of pain and sx, with improved overall function. Although patient is improving, he continues to have limitations with walking for prolonged periods of time, as well as postural limitations with right lateral shift in all positions. Pt able to demonstrate improved posture with a few rounds of suitcase carries on his L side (40#). Pt also able to demonstrate improved STS speeds/explosiveness with cueing and practice, demonstrating great lower body strength and power. Patient will benefit from further skilled therapy to return to prior level of function.    Personal Factors and Comorbidities  Age;Comorbidity 1    Comorbidities  DMII, Chronic pain     Examination-Activity Limitations  Carry;Bend;Lift;Stand    Examination-Participation Restrictions  Community Activity;Other   walking   Stability/Clinical Decision Making  Stable/Uncomplicated    Clinical Decision Making  Low    Rehab Potential  Good    PT Frequency  2x / week    PT Duration  6 weeks    PT Treatment/Interventions  Patient/family education;Therapeutic exercise;Therapeutic activities;Neuromuscular re-education;Stair training;Electrical Stimulation;Moist Heat;Manual techniques;Dry needling;Scar mobilization;Joint Manipulations;Spinal Manipulations;Passive range of motion    PT Next Visit Plan  continue extension based exercise, address lateral shift in walking    PT Home Exercise Plan  see education section    Consulted and Agree with Plan of Care  Patient       Patient will benefit from skilled therapeutic intervention in order to improve the following deficits and impairments:  Abnormal gait, Decreased balance, Decreased endurance, Decreased mobility, Difficulty walking, Increased muscle spasms, Decreased range of motion, Obesity, Decreased activity tolerance, Decreased  strength, Pain  Visit Diagnosis: 1. Muscle weakness (generalized)   2. Chronic bilateral low back pain, unspecified whether sciatica present        Problem List Patient Active Problem List   Diagnosis Date Noted   Pain due to onychomycosis of toenails of both feet 07/31/2018   Laceration of left little finger without foreign body without damage to nail 06/17/2018   Osteoarthritis of knee 05/23/2017   Cerumen impaction 05/20/2017   Ex-smoker 03/27/2017   Elevated PSA 03/27/2017   Personal history of colonic polyps    Stress due to family tension 05/15/2016   Skin rash 05/15/2016   Hx of colonic polyps    Benign neoplasm of transverse colon    Polyp of sigmoid colon    Benign neoplasm of descending colon    Chronic midline low back pain 02/27/2016   Bilateral hip pain 11/02/2015   Right knee pain 07/12/2015   Health maintenance examination 03/07/2015   Severe obesity (BMI 35.0-39.9) with comorbidity ([place]) 03/07/2015   Advanced care planning/counseling discussion 03/07/2015   Controlled diabetes mellitus type 2 with complications ([place]) 05/39/7673   Dyslipidemia associated with type 2 diabetes mellitus ([place]) 05/06/2014   OSA (obstructive sleep apnea) 05/06/2014   Fatty liver disease, nonalcoholic 41/93/7902   H/O malignant neoplasm of skin 01/01/2013   HTN (hypertension) 02/15/2009   CAD (coronary artery disease), native coronary artery 02/15/2009   Shortness of breath 02/15/2009    Scarlette Calico, SPT 08/19/2018, 1:34 PM  [facility] [address] [place], [address] Phone: [phone]   Fax:  [phone]  Name: Christophe Rising MRN: [id number] Date of Birth: 14-Dec-1950

## 2018-08-21 ENCOUNTER — Ambulatory Visit: Payer: 59

## 2018-08-26 ENCOUNTER — Ambulatory Visit: Payer: 59

## 2018-08-26 ENCOUNTER — Other Ambulatory Visit: Payer: Self-pay

## 2018-08-26 DIAGNOSIS — M6281 Muscle weakness (generalized): Secondary | ICD-10-CM

## 2018-08-26 DIAGNOSIS — G8929 Other chronic pain: Secondary | ICD-10-CM

## 2018-08-26 DIAGNOSIS — M545 Low back pain, unspecified: Secondary | ICD-10-CM

## 2018-08-26 NOTE — Therapy (Signed)
[facility] [address] [place], [address] Phone: [phone]   Fax:  [phone]  Physical Therapy Treatment  Patient Details  Name: Casey Golden MRN: [id number] Date of Birth: 1950-05-02 Referring Provider (PT): Marin Olp PA   Encounter Date: 08/26/2018  PT End of Session - 08/26/18 1047    Visit Number  8    Number of Visits  13    Date for PT Re-Evaluation  09/23/18    Authorization Type  6/10 G Code    PT Start Time  0815    PT Stop Time  0900    PT Time Calculation (min)  45 min    Activity Tolerance  Patient tolerated treatment well;No increased pain    Behavior During Therapy  WFL for tasks assessed/performed       Past Medical History:  Diagnosis Date   Arthritis    CAD (coronary artery disease)    cathx3 with nonobstructive disease. Last cath with RCA 40% stenosis in 2009   Cataract 2019   bilateral; resolved with surgery   Colonic polyp    Fatty liver disease, nonalcoholic 0174   by [place]   GERD (gastroesophageal reflux disease)    History of chicken pox    Hyperlipidemia    Hypertension    Nocturia    OSA (obstructive sleep apnea)    CPAP, compliant   Past use of tobacco    Quit 1990, 70 pack year history   Rash of genital area    09-08-2014  per pt Dr Junious Silk aware   Seasonal and perennial allergic rhinitis    Type 2 diabetes mellitus ([place])    Wears dentures    full upper/  partial lower   Wears glasses     Past Surgical History:  Procedure Laterality Date   ABDOMINAL HERNIA REPAIR  2007      [place]   open repair   APPENDECTOMY  age 68   CARDIAC CATHETERIZATION  12-23-2007   [place]   Abnormal myoview w/ ischemia/  40% mRCA with nonobstructive and no sig. plaque in his left system, EF 55%   CARDIAC CATHETERIZATION  Apr 2008    [facility]   Abnormal myoview/  50% RCA,  ef 65%   CARDIAC CATHETERIZATION  1999      [facility]   CATARACT EXTRACTION, BILATERAL  Bilateral 11/2017   CERVICAL FUSION  1992   CHOLECYSTECTOMY OPEN  2006   COLONOSCOPY WITH PROPOFOL N/A 04/17/2016   TAs, high grade dysplasia with margins clear, diverticulosis Lucilla Lame, MD)   COLONOSCOPY WITH PROPOFOL N/A 08/07/2016   TAx1, diverticulosis, rpt 3 yrs ([facility])   DENTAL SURGERY     metal dental implant L mandible   EXCISION OF SKIN TAG Right 09/14/2014   Procedure: EXCISION OF SKIN TAG;  Surgeon: Festus Aloe, MD;  Location: [facility];  Service: Urology;  Laterality: Right;   HYDROCELE EXCISION Left 09/14/2014   Procedure: LEFT HYDROCELECTOMY ADULT;  Surgeon: Festus Aloe, MD;  Location: [facility];  Service: Urology;  Laterality: Left;   MOHS SURGERY  2015   skin cancer   TONSILLECTOMY  age 59    There were no vitals filed for this visit.  Subjective Assessment - 08/26/18 0854    Subjective  Pt reports current 0/10, citing his regular mornings walks going well and inconsistent compliance with HEP. No major changes since last session.    Pertinent History  Chronic LBP -  previously seen for PT, DMII    How long can you walk comfortably?  10 min    Patient Stated Goals  To decrease pain    Currently in Pain?  No/denies    Pain Score  0-No pain    Pain Onset  More than a month ago    Multiple Pain Sites  No        Manual Therapy to improve muscle spasms and pain response.   STM to bilateral, superficial lumbosacral region, superiorly and M/L directions, unilateral joint PA T2-T12, and joint PA mobilizations L2-S2, with patient positioned in prone.       Therapeutic Exercise to address and improve his strength and activity tolerance with mobility.   Standing lumbar extension x20 Side bending CKC in standing x20 with OP Hip ext in standing with lumbar ext x20 , 3# Seated Thoracic Rotation with arms crossed, 2x10 each side STS 2x12 without UE support and focus on explosiveness Suitcase Carry 40 lb, 300 feet L side    Performed exercises to improve lumbar spine mobility, pain response, lower body strength/power, as well as to address his lateral shift. At the end of the session, pt reported 0/10 pain.     PT Education - 08/26/18 0913    Education Details  Educated on technique/form. Added seated thoracic rotations to HEP.    Person(s) Educated  Patient    Methods  Explanation;Demonstration;Tactile cues;Verbal cues    Comprehension  Verbalized understanding;Returned demonstration;Verbal cues required;Tactile cues required          PT Long Term Goals - 08/12/18 0954      PT LONG TERM GOAL #1   Title  Patient will be independent with HEP to continue benefits of therapy until after discharge.     Baseline  dependent; 08/12/2018: moderate cueing for exercise    Time  6    Period  Weeks    Status  On-going      PT LONG TERM GOAL #2   Title  Patient will improve 5XSTS to <16 sec to demonstrate significant improvement in functional LE strength and improved ability to transfer    Baseline  5XSTS: 55sec; 08/12/2018: 20.8 sec    Time  6    Period  Weeks    Status  On-going      PT LONG TERM GOAL #3   Title  Patient will have a worse pain in the back/LE to 2/10 in the past 2 weeks to demonstrate significant improvement in pain level and better ability to walk.     Baseline  worse pain: 5/10    Time  6    Period  Weeks    Status  New            Plan - 08/26/18 1048    Clinical Impression Statement  Pt continues to demonstrate increased capacity for exercise, as well as complete resolution of sx. Pt was observed to have improved standing posture, citing his practice and increased awareness for his right lateral shift. Pt also able to tolerate increased resistance with standing hip extensions, demonstrating improved hip extension mobility and strength. Despite pts progress, he still demonstrates impaired dynamic posture with his right lateral shift (though improved), strength, and endurance with  activity (and prolonged standing). Pt will continue to benefit from skilled therapy treatment in order to improve his aforementioned impairments, which will allow him to return to prior level of function.    Personal Factors and Comorbidities  Age;Comorbidity 1  Comorbidities  DMII, Chronic pain     Examination-Activity Limitations  Carry;Bend;Lift;Stand    Examination-Participation Restrictions  Community Activity;Other   walking   Stability/Clinical Decision Making  Stable/Uncomplicated    Rehab Potential  Good    PT Frequency  2x / week    PT Duration  6 weeks    PT Treatment/Interventions  Patient/family education;Therapeutic exercise;Therapeutic activities;Neuromuscular re-education;Stair training;Electrical Stimulation;Moist Heat;Manual techniques;Dry needling;Scar mobilization;Joint Manipulations;Spinal Manipulations;Passive range of motion    PT Next Visit Plan  continue extension based exercise, address lateral shift in walking    PT Home Exercise Plan  see education section    Consulted and Agree with Plan of Care  Patient       Patient will benefit from skilled therapeutic intervention in order to improve the following deficits and impairments:  Abnormal gait, Decreased balance, Decreased endurance, Decreased mobility, Difficulty walking, Increased muscle spasms, Decreased range of motion, Obesity, Decreased activity tolerance, Decreased strength, Pain  Visit Diagnosis: 1. Muscle weakness (generalized)   2. Chronic bilateral low back pain, unspecified whether sciatica present        Problem List Patient Active Problem List   Diagnosis Date Noted   Pain due to onychomycosis of toenails of both feet 07/31/2018   Laceration of left little finger without foreign body without damage to nail 06/17/2018   Osteoarthritis of knee 05/23/2017   Cerumen impaction 05/20/2017   Ex-smoker 03/27/2017   Elevated PSA 03/27/2017   Personal history of colonic polyps    Stress  due to family tension 05/15/2016   Skin rash 05/15/2016   Hx of colonic polyps    Benign neoplasm of transverse colon    Polyp of sigmoid colon    Benign neoplasm of descending colon    Chronic midline low back pain 02/27/2016   Bilateral hip pain 11/02/2015   Right knee pain 07/12/2015   Health maintenance examination 03/07/2015   Severe obesity (BMI 35.0-39.9) with comorbidity ([place]) 03/07/2015   Advanced care planning/counseling discussion 03/07/2015   Controlled diabetes mellitus type 2 with complications ([place]) 47/09/2955   Dyslipidemia associated with type 2 diabetes mellitus ([place]) 05/06/2014   OSA (obstructive sleep apnea) 05/06/2014   Fatty liver disease, nonalcoholic 47/34/0370   H/O malignant neoplasm of skin 01/01/2013   HTN (hypertension) 02/15/2009   CAD (coronary artery disease), native coronary artery 02/15/2009   Shortness of breath 02/15/2009    Scarlette Calico, SPT 08/26/2018, 10:55 AM  [facility] [address] [place], [address] Phone: [phone]   Fax:  [phone]  Name: Casey Golden MRN: [id number] Date of Birth: Apr 24, 1950

## 2018-08-28 ENCOUNTER — Ambulatory Visit: Payer: 59

## 2018-09-04 ENCOUNTER — Ambulatory Visit: Payer: 59 | Attending: Student

## 2018-09-04 ENCOUNTER — Other Ambulatory Visit: Payer: Self-pay

## 2018-09-04 DIAGNOSIS — M545 Low back pain: Secondary | ICD-10-CM | POA: Diagnosis present

## 2018-09-04 DIAGNOSIS — G8929 Other chronic pain: Secondary | ICD-10-CM | POA: Insufficient documentation

## 2018-09-04 DIAGNOSIS — M6281 Muscle weakness (generalized): Secondary | ICD-10-CM | POA: Insufficient documentation

## 2018-09-04 NOTE — Therapy (Signed)
[facility] [address], [place], [address] Phone: [phone]   Fax:  [phone]  Physical Therapy Treatment  Patient Details  Name: Casey Golden MRN: [id number] Date of Birth: 1950-06-04 Referring Provider (PT): Marin Olp PA   Encounter Date: 09/04/2018  PT End of Session - 09/04/18 0854    Visit Number  9    Number of Visits  13    Date for PT Re-Evaluation  09/23/18    Authorization Type  4/10 G Code    PT Start Time  0822    PT Stop Time  0900    PT Time Calculation (min)  38 min    Activity Tolerance  Patient tolerated treatment well;No increased pain    Behavior During Therapy  WFL for tasks assessed/performed       Past Medical History:  Diagnosis Date   Arthritis    CAD (coronary artery disease)    cathx3 with nonobstructive disease. Last cath with RCA 40% stenosis in 2009   Cataract 2019   bilateral; resolved with surgery   Colonic polyp    Fatty liver disease, nonalcoholic 7858   by [place]   GERD (gastroesophageal reflux disease)    History of chicken pox    Hyperlipidemia    Hypertension    Nocturia    OSA (obstructive sleep apnea)    CPAP, compliant   Past use of tobacco    Quit 1990, 70 pack year history   Rash of genital area    09-08-2014  per pt Dr Junious Silk aware   Seasonal and perennial allergic rhinitis    Type 2 diabetes mellitus ([place])    Wears dentures    full upper/  partial lower   Wears glasses     Past Surgical History:  Procedure Laterality Date   ABDOMINAL HERNIA REPAIR  2007      [place]   open repair   APPENDECTOMY  age 68   CARDIAC CATHETERIZATION  12-23-2007   [place]   Abnormal myoview w/ ischemia/  40% mRCA with nonobstructive and no sig. plaque in his left system, EF 55%   CARDIAC CATHETERIZATION  Apr 2008    [facility]   Abnormal myoview/  50% RCA,  ef 65%   CARDIAC CATHETERIZATION  1999      [facility]   CATARACT EXTRACTION, BILATERAL  Bilateral 11/2017   CERVICAL FUSION  1992   CHOLECYSTECTOMY OPEN  2006   COLONOSCOPY WITH PROPOFOL N/A 04/17/2016   TAs, high grade dysplasia with margins clear, diverticulosis Lucilla Lame, MD)   COLONOSCOPY WITH PROPOFOL N/A 08/07/2016   TAx1, diverticulosis, rpt 3 yrs ([facility])   DENTAL SURGERY     metal dental implant L mandible   EXCISION OF SKIN TAG Right 09/14/2014   Procedure: EXCISION OF SKIN TAG;  Surgeon: Festus Aloe, MD;  Location: [facility];  Service: Urology;  Laterality: Right;   HYDROCELE EXCISION Left 09/14/2014   Procedure: LEFT HYDROCELECTOMY ADULT;  Surgeon: Festus Aloe, MD;  Location[facility];  Service: Urology;  Laterality: Left;   MOHS SURGERY  2015   skin cancer   TONSILLECTOMY  age 68    There were no vitals filed for this visit.  Subjective Assessment - 09/04/18 0830    Subjective  Pt reports 0/10 back pain but reports that both knees are bothering him. He states that he is trying to get a steroid injection to help his knee pain. No  other major changes. He is continuing his morning walks with little difficulty.    Pertinent History  Chronic LBP - previously seen for PT, DMII    How long can you walk comfortably?  10 min    Patient Stated Goals  To decrease pain    Currently in Pain?  No/denies    Pain Score  0-No pain    Pain Onset  More than a month ago          Therapeutic Exercise to address and improve his strength and activity tolerance with mobility.   Seated T-spine rotation with OP x8 Seated T-spine rotation with arms extended x15 Standing lumbar extension x20 Side bending CKC in standing x20  Standing Hip ext in standing with lumbar ext x20 , 4# Standing Hip ext with knee bent, 4# Standing Hip abd, 4# STS 2x12 without UE support and focus on explosiveness Seated R LAQ 3s holds, focusing on active quad contraction Suitcase Carry 40 lb, 300 feet L side   Performed exercises to improve lumbar  spine mobility, pain response, lower body strength/power, as well as to address his lateral shift. At the end of the session, pt reported 0/10 pain.    PT Education - 09/04/18 [phone]    Education Details  Educated on technique/form. Continue HEP as is.    Person(s) Educated  Patient    Methods  Explanation;Demonstration;Tactile cues;Verbal cues    Comprehension  Verbalized understanding;Returned demonstration          PT Long Term Goals - 08/12/18 0954      PT LONG TERM GOAL #1   Title  Patient will be independent with HEP to continue benefits of therapy until after discharge.     Baseline  dependent; 08/12/2018: moderate cueing for exercise    Time  6    Period  Weeks    Status  On-going      PT LONG TERM GOAL #2   Title  Patient will improve 5XSTS to <16 sec to demonstrate significant improvement in functional LE strength and improved ability to transfer    Baseline  5XSTS: 55sec; 08/12/2018: 20.8 sec    Time  6    Period  Weeks    Status  On-going      PT LONG TERM GOAL #3   Title  Patient will have a worse pain in the back/LE to 2/10 in the past 2 weeks to demonstrate significant improvement in pain level and better ability to walk.     Baseline  worse pain: 5/10    Time  6    Period  Weeks    Status  New            Plan - 09/04/18 0907    Clinical Impression Statement  Pt continues to demonstrate improved activity tolerance, mobility, and strength with progression of exercise. Pt able to tolerate incorporated thoracic mobility and progressed hip strengthening exercises in seated and standing, respectively, and with increased weight (3# to 4#). Pt demonstrated inconsistent technique with standing hip extensions and abductions, citing difficulty with standing upright. Tactile and verbal cueing were required. Pt demonstrated improved speed and decreased reliance of UE with his power STS, as well as improved tolerance and speed with suitcase carries (40#). Pt continues to  demonstrate impaired lateral shift, impaired lumbar and hip mobility/strength, and activity tolerance with higher intensity of exercise. Pt will continue to benefit from skilled therapy treatment in order to return to prior level of function.  Personal Factors and Comorbidities  Age;Comorbidity 1    Comorbidities  DMII, Chronic pain     Examination-Activity Limitations  Carry;Bend;Lift;Stand    Examination-Participation Restrictions  Community Activity;Other   walking   Stability/Clinical Decision Making  Stable/Uncomplicated    Rehab Potential  Good    PT Frequency  2x / week    PT Duration  6 weeks    PT Treatment/Interventions  Patient/family education;Therapeutic exercise;Therapeutic activities;Neuromuscular re-education;Stair training;Electrical Stimulation;Moist Heat;Manual techniques;Dry needling;Scar mobilization;Joint Manipulations;Spinal Manipulations;Passive range of motion    PT Next Visit Plan  continue extension based exercise, address lateral shift in walking    PT Home Exercise Plan  see education section    Consulted and Agree with Plan of Care  Patient       Patient will benefit from skilled therapeutic intervention in order to improve the following deficits and impairments:  Abnormal gait, Decreased balance, Decreased endurance, Decreased mobility, Difficulty walking, Increased muscle spasms, Decreased range of motion, Obesity, Decreased activity tolerance, Decreased strength, Pain  Visit Diagnosis: 1. Muscle weakness (generalized)   2. Chronic bilateral low back pain, unspecified whether sciatica present        Problem List Patient Active Problem List   Diagnosis Date Noted   Pain due to onychomycosis of toenails of both feet 07/31/2018   Laceration of left little finger without foreign body without damage to nail 06/17/2018   Osteoarthritis of knee 05/23/2017   Cerumen impaction 05/20/2017   Ex-smoker 03/27/2017   Elevated PSA 03/27/2017   Personal  history of colonic polyps    Stress due to family tension 05/15/2016   Skin rash 05/15/2016   Hx of colonic polyps    Benign neoplasm of transverse colon    Polyp of sigmoid colon    Benign neoplasm of descending colon    Chronic midline low back pain 02/27/2016   Bilateral hip pain 11/02/2015   Right knee pain 07/12/2015   Health maintenance examination 03/07/2015   Severe obesity (BMI 35.0-39.9) with comorbidity ([place]) 03/07/2015   Advanced care planning/counseling discussion 03/07/2015   Controlled diabetes mellitus type 2 with complications ([place]) 28/76/8115   Dyslipidemia associated with type 2 diabetes mellitus ([place]) 05/06/2014   OSA (obstructive sleep apnea) 05/06/2014   Fatty liver disease, nonalcoholic 72/62/0355   H/O malignant neoplasm of skin 01/01/2013   HTN (hypertension) 02/15/2009   CAD (coronary artery disease), native coronary artery 02/15/2009   Shortness of breath 02/15/2009    Scarlette Calico, SPT 09/04/2018, 9:19 AM  [facility] [address], [place], [address] Phone: [phone]   Fax:  [phone]  Name: Casey Golden MRN: [id number] Date of Birth: 12-Aug-1950

## 2018-09-09 ENCOUNTER — Ambulatory Visit: Payer: 59

## 2018-09-09 ENCOUNTER — Other Ambulatory Visit: Payer: Self-pay

## 2018-09-09 DIAGNOSIS — G8929 Other chronic pain: Secondary | ICD-10-CM

## 2018-09-09 DIAGNOSIS — M545 Low back pain, unspecified: Secondary | ICD-10-CM

## 2018-09-09 DIAGNOSIS — M6281 Muscle weakness (generalized): Secondary | ICD-10-CM | POA: Diagnosis not present

## 2018-09-09 NOTE — Therapy (Signed)
[facility] [address], [place], [address] Phone: [phone]   Fax:  [phone]  Physical Therapy Treatment  Patient Details  Name: Casey Golden MRN: [id number] Date of Birth: 22-Jan-1951 Referring Provider (PT): Marin Olp PA   Encounter Date: 09/09/2018  PT End of Session - 09/09/18 1038    Visit Number  10    Number of Visits  13    Date for PT Re-Evaluation  09/23/18    Authorization Type  9/10 G Code    PT Start Time  0822    PT Stop Time  0900    PT Time Calculation (min)  38 min    Activity Tolerance  Patient tolerated treatment well;No increased pain    Behavior During Therapy  WFL for tasks assessed/performed       Past Medical History:  Diagnosis Date   Arthritis    CAD (coronary artery disease)    cathx3 with nonobstructive disease. Last cath with RCA 40% stenosis in 2009   Cataract 2019   bilateral; resolved with surgery   Colonic polyp    Fatty liver disease, nonalcoholic 2876   by [place]   GERD (gastroesophageal reflux disease)    History of chicken pox    Hyperlipidemia    Hypertension    Nocturia    OSA (obstructive sleep apnea)    CPAP, compliant   Past use of tobacco    Quit 1990, 70 pack year history   Rash of genital area    09-08-2014  per pt Dr Junious Silk aware   Seasonal and perennial allergic rhinitis    Type 2 diabetes mellitus ([place])    Wears dentures    full upper/  partial lower   Wears glasses     Past Surgical History:  Procedure Laterality Date   ABDOMINAL HERNIA REPAIR  2007      [place]   open repair   APPENDECTOMY  age 72   CARDIAC CATHETERIZATION  12-23-2007   [place]   Abnormal myoview w/ ischemia/  40% mRCA with nonobstructive and no sig. plaque in his left system, EF 55%   CARDIAC CATHETERIZATION  Apr 2008    [facility]   Abnormal myoview/  50% RCA,  ef 65%   CARDIAC CATHETERIZATION  1999      [facility]   CATARACT EXTRACTION,  BILATERAL Bilateral 11/2017   CERVICAL FUSION  1992   CHOLECYSTECTOMY OPEN  2006   COLONOSCOPY WITH PROPOFOL N/A 04/17/2016   TAs, high grade dysplasia with margins clear, diverticulosis Lucilla Lame, MD)   COLONOSCOPY WITH PROPOFOL N/A 08/07/2016   TAx1, diverticulosis, rpt 3 yrs ([facility])   DENTAL SURGERY     metal dental implant L mandible   EXCISION OF SKIN TAG Right 09/14/2014   Procedure: EXCISION OF SKIN TAG;  Surgeon: Festus Aloe, MD;  Location: [facility];  Service: Urology;  Laterality: Right;   HYDROCELE EXCISION Left 09/14/2014   Procedure: LEFT HYDROCELECTOMY ADULT;  Surgeon: Festus Aloe, MD;  Location: [facility];  Service: Urology;  Laterality: Left;   MOHS SURGERY  2015   skin cancer   TONSILLECTOMY  age 31    There were no vitals filed for this visit.  Subjective Assessment - 09/09/18 0829    Subjective  Pt reports a current 0/10 pain and improved knee sx overall the past week (intermittent soreness after prolonged walking).    Pertinent History  Chronic LBP - previously seen  for PT, DMII    How long can you walk comfortably?  10 min    Patient Stated Goals  To decrease pain    Currently in Pain?  No/denies    Pain Score  0-No pain    Pain Onset  More than a month ago       TREATMENT Therapeutic Exercise to address and improve his strength and activity tolerance with mobility.   Standing lumbar rotation with PVC pip Standing lumbar extension x20 Side bending CKC in standing x20  Standing Hip ext in standing with lumbar ext x20 , 4# Standing Hip ext with knee bent, 4# Standing Hip abd, 4# STS 2x12 without UE support and focus on explosiveness Seated R LAQ, 4# and 3s holds, focusing on active quad contraction Suitcase Carry 40 lb, 300 feet L side   Performed exercises to improve lumbar spine mobility, pain response, lower body strength/power, as well as to address his lateral shift. At the end of the session, pt  reported 0/10 pain.      PT Education - 09/09/18 0856    Education Details  Pt educated on technique/form.    Person(s) Educated  Patient    Methods  Explanation;Demonstration;Verbal cues;Tactile cues    Comprehension  Verbalized understanding;Returned demonstration          PT Long Term Goals - 08/12/18 0954      PT LONG TERM GOAL #1   Title  Patient will be independent with HEP to continue benefits of therapy until after discharge.     Baseline  dependent; 08/12/2018: moderate cueing for exercise    Time  6    Period  Weeks    Status  On-going      PT LONG TERM GOAL #2   Title  Patient will improve 5XSTS to <16 sec to demonstrate significant improvement in functional LE strength and improved ability to transfer    Baseline  5XSTS: 55sec; 08/12/2018: 20.8 sec    Time  6    Period  Weeks    Status  On-going      PT LONG TERM GOAL #3   Title  Patient will have a worse pain in the back/LE to 2/10 in the past 2 weeks to demonstrate significant improvement in pain level and better ability to walk.     Baseline  worse pain: 5/10    Time  6    Period  Weeks    Status  New            Plan - 09/09/18 1043    Clinical Impression Statement  Pt continues to demonstrate improved activity tolerance, mobility, and strength with progression of exercise. Pt demonstrated improved technique with standing hip extensions and abductions, demonstrating improved upright standing posture. Pt demonstrated increased difficulty with standing hip extensions with knees bent, indicating decreased motor control and glute max weakness. Pt continues to demonstrate improved speed and decreased reliance of UE with his power STS, as well as improved tolerance and speed with suitcase carries (40#). Pt continues to demonstrate impaired lateral shift, impaired lumbar and hip mobility/strength, and activity tolerance with higher intensity of exercise. Pt will continue to benefit from skilled therapy treatment  in order to return to prior level of function.    Personal Factors and Comorbidities  Age;Comorbidity 1    Comorbidities  DMII, Chronic pain     Examination-Activity Limitations  Carry;Bend;Lift;Stand    Examination-Participation Restrictions  Community Activity;Other   walking   Stability/Clinical Decision Making  Stable/Uncomplicated    Rehab Potential  Good    PT Frequency  2x / week    PT Duration  6 weeks    PT Treatment/Interventions  Patient/family education;Therapeutic exercise;Therapeutic activities;Neuromuscular re-education;Stair training;Electrical Stimulation;Moist Heat;Manual techniques;Dry needling;Scar mobilization;Joint Manipulations;Spinal Manipulations;Passive range of motion    PT Next Visit Plan  continue extension based exercise, address lateral shift in walking    PT Home Exercise Plan  see education section    Consulted and Agree with Plan of Care  Patient       Patient will benefit from skilled therapeutic intervention in order to improve the following deficits and impairments:  Abnormal gait, Decreased balance, Decreased endurance, Decreased mobility, Difficulty walking, Increased muscle spasms, Decreased range of motion, Obesity, Decreased activity tolerance, Decreased strength, Pain  Visit Diagnosis: 1. Muscle weakness (generalized)   2. Chronic bilateral low back pain, unspecified whether sciatica present        Problem List Patient Active Problem List   Diagnosis Date Noted   Pain due to onychomycosis of toenails of both feet 07/31/2018   Laceration of left little finger without foreign body without damage to nail 06/17/2018   Osteoarthritis of knee 05/23/2017   Cerumen impaction 05/20/2017   Ex-smoker 03/27/2017   Elevated PSA 03/27/2017   Personal history of colonic polyps    Stress due to family tension 05/15/2016   Skin rash 05/15/2016   Hx of colonic polyps    Benign neoplasm of transverse colon    Polyp of sigmoid colon     Benign neoplasm of descending colon    Chronic midline low back pain 02/27/2016   Bilateral hip pain 11/02/2015   Right knee pain 07/12/2015   Health maintenance examination 03/07/2015   Severe obesity (BMI 35.0-39.9) with comorbidity ([place]) 03/07/2015   Advanced care planning/counseling discussion 03/07/2015   Controlled diabetes mellitus type 2 with complications ([place]) 63/14/9702   Dyslipidemia associated with type 2 diabetes mellitus ([place]) 05/06/2014   OSA (obstructive sleep apnea) 05/06/2014   Fatty liver disease, nonalcoholic 63/78/5885   H/O malignant neoplasm of skin 01/01/2013   HTN (hypertension) 02/15/2009   CAD (coronary artery disease), native coronary artery 02/15/2009   Shortness of breath 02/15/2009    Scarlette Calico, SPT 09/09/2018, 10:44 AM  [facility] [address], [place], [address] Phone: [phone]   Fax:  [phone]  Name: Casey Golden MRN: [id number] Date of Birth: Mar 21, 1950

## 2018-09-11 ENCOUNTER — Other Ambulatory Visit: Payer: Self-pay

## 2018-09-11 ENCOUNTER — Ambulatory Visit: Payer: 59

## 2018-09-11 DIAGNOSIS — G8929 Other chronic pain: Secondary | ICD-10-CM

## 2018-09-11 DIAGNOSIS — M6281 Muscle weakness (generalized): Secondary | ICD-10-CM | POA: Diagnosis not present

## 2018-09-11 DIAGNOSIS — M545 Low back pain, unspecified: Secondary | ICD-10-CM

## 2018-09-11 NOTE — Therapy (Signed)
[facility] [address], [place], [address] Phone: [phone]   Fax:  [phone]  Physical Therapy Treatment  Patient Details  Name: Casey Golden MRN: [id number] Date of Birth: [birth year] Referring Provider (PT): Marin Olp PA   Encounter Date: 09/11/2018  PT End of Session - 09/11/18 0844    Visit Number  11    Number of Visits  20    Date for PT Re-Evaluation  09/23/18    Authorization Type  7/10 G Code    PT Start Time  0815    PT Stop Time  0900    PT Time Calculation (min)  45 min    Activity Tolerance  Patient tolerated treatment well;No increased pain    Behavior During Therapy  WFL for tasks assessed/performed       Past Medical History:  Diagnosis Date   Arthritis    CAD (coronary artery disease)    cathx3 with nonobstructive disease. Last cath with RCA 40% stenosis in 2009   Cataract 2019   bilateral; resolved with surgery   Colonic polyp    Fatty liver disease, nonalcoholic 6712   by [place]   GERD (gastroesophageal reflux disease)    History of chicken pox    Hyperlipidemia    Hypertension    Nocturia    OSA (obstructive sleep apnea)    CPAP, compliant   Past use of tobacco    Quit 1990, 70 pack year history   Rash of genital area    09-08-2014  per pt Dr Junious Silk aware   Seasonal and perennial allergic rhinitis    Type 2 diabetes mellitus ([place])    Wears dentures    full upper/  partial lower   Wears glasses     Past Surgical History:  Procedure Laterality Date   ABDOMINAL HERNIA REPAIR  2007      [place]   open repair   APPENDECTOMY  age 89   CARDIAC CATHETERIZATION  12-23-2007   [place]   Abnormal myoview w/ ischemia/  40% mRCA with nonobstructive and no sig. plaque in his left system, EF 55%   CARDIAC CATHETERIZATION  Apr 2008    [facility]   Abnormal myoview/  50% RCA,  ef 65%   CARDIAC CATHETERIZATION  1999      [facility]   CATARACT EXTRACTION,  BILATERAL Bilateral 11/2017   CERVICAL FUSION  1992   CHOLECYSTECTOMY OPEN  2006   COLONOSCOPY WITH PROPOFOL N/A 04/17/2016   TAs, high grade dysplasia with margins clear, diverticulosis Lucilla Lame, MD)   COLONOSCOPY WITH PROPOFOL N/A 08/07/2016   TAx1, diverticulosis, rpt 3 yrs ([facility])   DENTAL SURGERY     metal dental implant L mandible   EXCISION OF SKIN TAG Right 09/14/2014   Procedure: EXCISION OF SKIN TAG;  Surgeon: Festus Aloe, MD;  Location: [facility];  Service: Urology;  Laterality: Right;   HYDROCELE EXCISION Left 09/14/2014   Procedure: LEFT HYDROCELECTOMY ADULT;  Surgeon: Festus Aloe, MD;  Location: [facility];  Service: Urology;  Laterality: Left;   MOHS SURGERY  2015   skin cancer   TONSILLECTOMY  age 38    There were no vitals filed for this visit.  Subjective Assessment - 09/11/18 0818    Subjective  Pt continues to report 0/10 back pain and improved bilat knee sx. Pt continues to be able to walk pain-free.    Pertinent History  Chronic LBP - previously  seen for PT, DMII    How long can you walk comfortably?  10 min    Patient Stated Goals  To decrease pain    Currently in Pain?  No/denies    Pain Score  0-No pain    Pain Onset  More than a month ago         TREATMENT Therapeutic Exercise to address and improve his strength and activity tolerance with mobility.   Standing lumbar rotation with PVC pipe Standing lumbar extension x20 Side bending CKC in standing x20  Standing Hip ext in standing with lumbar ext x20, 4# Standing Hip ext with knee bent, 4# Standing Hip abd, 4# STS 4x5 explosive concentric phase, eccentric 5 sec 12 sec 5 x STS without UE Seated R LAQ, 4# and 3s holds, focusing on active quad contraction Seated LAQ, unweighted, 2x10 Standing rows 35# Seated lat pulldowns 45#  At the end of the session, pt demonstrated no inc of sx and moderate soreness.   PT Education - 09/11/18 1041     Education Details  Pt educated on technique/form. Continue HEP as is.    Person(s) Educated  Patient    Methods  Explanation;Demonstration;Tactile cues;Verbal cues    Comprehension  Verbalized understanding;Returned demonstration          PT Long Term Goals - 08/12/18 0954      PT LONG TERM GOAL #1   Title  Patient will be independent with HEP to continue benefits of therapy until after discharge.     Baseline  dependent; 08/12/2018: moderate cueing for exercise    Time  6    Period  Weeks    Status  On-going      PT LONG TERM GOAL #2   Title  Patient will improve 5XSTS to <16 sec to demonstrate significant improvement in functional LE strength and improved ability to transfer    Baseline  5XSTS: 55sec; 08/12/2018: 20.8 sec    Time  6    Period  Weeks    Status  On-going      PT LONG TERM GOAL #3   Title  Patient will have a worse pain in the back/LE to 2/10 in the past 2 weeks to demonstrate significant improvement in pain level and better ability to walk.     Baseline  worse pain: 5/10    Time  6    Period  Weeks    Status  New            Plan - 09/11/18 1125    Clinical Impression Statement  Pt continues to demonstrate improved activity tolerance, mobility, and strength with continued progression of exercise. Pt continues to demonstrate improved technique with standing hip extensions and abductions, indicating improved upright standing posture. Pt does still demonstrate increased difficulty with standing hip extensions with knees bent, indicating decreased motor control and glute max weakness. Pt able to perform 5x STS in 12 sec, demonstrating significantly improved B LE strength and power, compared to previous 20s time. Pt able to tolerate progression of standing rows 35# and seated lat pull downs with 45# with no sx exacerbation, indicating improved postural strength of upper back. Pt continues to demonstrate impaired lateral shift, impaired lumbar and hip  mobility/strength, and activity tolerance with higher intensity of exercise. Pt will continue to benefit from skilled therapy treatment in order to return to prior level of function.    Personal Factors and Comorbidities  Age;Comorbidity 1    Comorbidities  DMII, Chronic pain  Examination-Activity Limitations  Carry;Bend;Lift;Stand    Examination-Participation Restrictions  Community Activity;Other   walking   Stability/Clinical Decision Making  Stable/Uncomplicated    Rehab Potential  Good    PT Frequency  2x / week    PT Duration  6 weeks    PT Treatment/Interventions  Patient/family education;Therapeutic exercise;Therapeutic activities;Neuromuscular re-education;Stair training;Electrical Stimulation;Moist Heat;Manual techniques;Dry needling;Scar mobilization;Joint Manipulations;Spinal Manipulations;Passive range of motion    PT Next Visit Plan  continue extension based exercise, address lateral shift in walking    PT Home Exercise Plan  see education section    Consulted and Agree with Plan of Care  Patient       Patient will benefit from skilled therapeutic intervention in order to improve the following deficits and impairments:  Abnormal gait, Decreased balance, Decreased endurance, Decreased mobility, Difficulty walking, Increased muscle spasms, Decreased range of motion, Obesity, Decreased activity tolerance, Decreased strength, Pain  Visit Diagnosis: 1. Muscle weakness (generalized)   2. Chronic bilateral low back pain, unspecified whether sciatica present        Problem List Patient Active Problem List   Diagnosis Date Noted   Pain due to onychomycosis of toenails of both feet 07/31/2018   Laceration of left little finger without foreign body without damage to nail 06/17/2018   Osteoarthritis of knee 05/23/2017   Cerumen impaction 05/20/2017   Ex-smoker 03/27/2017   Elevated PSA 03/27/2017   Personal history of colonic polyps    Stress due to family tension  05/15/2016   Skin rash 05/15/2016   Hx of colonic polyps    Benign neoplasm of transverse colon    Polyp of sigmoid colon    Benign neoplasm of descending colon    Chronic midline low back pain 02/27/2016   Bilateral hip pain 11/02/2015   Right knee pain 07/12/2015   Health maintenance examination 03/07/2015   Severe obesity (BMI 35.0-39.9) with comorbidity ([place]) 03/07/2015   Advanced care planning/counseling discussion 03/07/2015   Controlled diabetes mellitus type 2 with complications ([place]) 93/81/8299   Dyslipidemia associated with type 2 diabetes mellitus ([place]) 05/06/2014   OSA (obstructive sleep apnea) 05/06/2014   Fatty liver disease, nonalcoholic 37/16/9678   H/O malignant neoplasm of skin 01/01/2013   HTN (hypertension) 02/15/2009   CAD (coronary artery disease), native coronary artery 02/15/2009   Shortness of breath 02/15/2009    Scarlette Calico, SPT 09/11/2018, 11:37 AM  [facility] [address] [place], [address] Phone: [phone]   Fax:  [phone]  Name: Suresh Audi MRN: [id number] Date of Birth: 02/14/1950

## 2018-09-16 ENCOUNTER — Ambulatory Visit: Payer: 59

## 2018-09-23 ENCOUNTER — Ambulatory Visit: Payer: 59

## 2018-09-25 ENCOUNTER — Other Ambulatory Visit: Payer: Self-pay

## 2018-09-25 ENCOUNTER — Ambulatory Visit: Payer: 59

## 2018-09-25 DIAGNOSIS — G8929 Other chronic pain: Secondary | ICD-10-CM

## 2018-09-25 DIAGNOSIS — M6281 Muscle weakness (generalized): Secondary | ICD-10-CM

## 2018-09-25 NOTE — Therapy (Signed)
[facility] [address] [place], [address] Phone: [phone]   Fax:  [phone]  Physical Therapy Treatment Physical Therapy Re-Certification Dates: 08/12/2018-09/25/2018  Patient Details  Name: Casey Golden MRN: [id number] Date of Birth: [birth year] Referring Provider (PT): Marin Olp PA   Encounter Date: 09/25/2018  PT End of Session - 09/25/18 0842    Visit Number  12    Number of Visits  20    Date for PT Re-Evaluation  09/23/18    Authorization Type  7/10 G Code    PT Start Time  0750    PT Stop Time  0830    PT Time Calculation (min)  40 min    Activity Tolerance  Patient tolerated treatment well;No increased pain    Behavior During Therapy  WFL for tasks assessed/performed       Past Medical History:  Diagnosis Date   Arthritis    CAD (coronary artery disease)    cathx3 with nonobstructive disease. Last cath with RCA 40% stenosis in 2009   Cataract 2019   bilateral; resolved with surgery   Colonic polyp    Fatty liver disease, nonalcoholic 6226   by [place]   GERD (gastroesophageal reflux disease)    History of chicken pox    Hyperlipidemia    Hypertension    Nocturia    OSA (obstructive sleep apnea)    CPAP, compliant   Past use of tobacco    Quit 1990, 70 pack year history   Rash of genital area    09-08-2014  per pt Dr Junious Silk aware   Seasonal and perennial allergic rhinitis    Type 2 diabetes mellitus ([place])    Wears dentures    full upper/  partial lower   Wears glasses     Past Surgical History:  Procedure Laterality Date   ABDOMINAL HERNIA REPAIR  2007      [place]   open repair   APPENDECTOMY  age 83   CARDIAC CATHETERIZATION  12-23-2007   [place]   Abnormal myoview w/ ischemia/  40% mRCA with nonobstructive and no sig. plaque in his left system, EF 55%   CARDIAC CATHETERIZATION  Apr 2008    [facility]   Abnormal myoview/  50% RCA,  ef 65%   CARDIAC  CATHETERIZATION  1999      [facility]   CATARACT EXTRACTION, BILATERAL Bilateral 11/2017   CERVICAL FUSION  1992   CHOLECYSTECTOMY OPEN  2006   COLONOSCOPY WITH PROPOFOL N/A 04/17/2016   TAs, high grade dysplasia with margins clear, diverticulosis Lucilla Lame, MD)   COLONOSCOPY WITH PROPOFOL N/A 08/07/2016   TAx1, diverticulosis, rpt 3 yrs ([facility])   DENTAL SURGERY     metal dental implant L mandible   EXCISION OF SKIN TAG Right 09/14/2014   Procedure: EXCISION OF SKIN TAG;  Surgeon: Festus Aloe, MD;  Location: [facility];  Service: Urology;  Laterality: Right;   HYDROCELE EXCISION Left 09/14/2014   Procedure: LEFT HYDROCELECTOMY ADULT;  Surgeon: Festus Aloe, MD;  Location: [facility];  Service: Urology;  Laterality: Left;   MOHS SURGERY  2015   skin cancer   TONSILLECTOMY  age 6    There were no vitals filed for this visit.  Subjective Assessment - 09/25/18 0755    Subjective  Pt reports a current 3/10 low back pain and R knee pain. Pt states that he has been increasingly busy with his family recently and  has been more active as of recent. Pt states that he felt a significant difference in his upright posture after performing lat pull downs last session.    Pertinent History  Chronic LBP - previously seen for PT, DMII    How long can you walk comfortably?  10 min    Patient Stated Goals  To decrease pain    Currently in Pain?  Yes    Pain Score  3     Pain Location  Back    Pain Orientation  Right;Left;Posterior;Distal;Lower    Pain Descriptors / Indicators  Aching    Pain Onset  More than a month ago        Manual Therapy to improve muscle spasms and pain response.   STM to bilateral, superficial lumbosacral region, superiorly and M/L directions, unilateral joint PA T2-T12, and joint PA mobilizations L2-S2, with patient positioned in prone.   Therapeutic Exercise to address and improve his strength and activity tolerance with  mobility.   Standing lumbar rotation with PVC pipe Standing lumbar extension x20 Side bending CKC in standing x20  Seated R LAQ, 4# and 3s holds, focusing on active quad contraction STS 2x5 explosive concentric phase, lean towards L side Suitcase carry (L) 40# x 200 feet Standing rows 35# 2x15 Seated lat pulldowns 45# 2x15   At the end of the session, pt demonstrated no inc of sx and moderate soreness.     PT Education - 09/25/18 0815    Education Details  Pt educated on technique/form. [place] TB given for rows and lat pulldowns.    Person(s) Educated  Patient    Methods  Explanation;Demonstration;Verbal cues    Comprehension  Verbalized understanding;Returned demonstration          PT Long Term Goals - 09/25/18 0854      PT LONG TERM GOAL #1   Title  Patient will be independent with HEP to continue benefits of therapy until after discharge.     Baseline  dependent; 08/12/2018: moderate cueing for exercise; 09/25/2018 minimal cueing for exercise    Time  6    Period  Weeks    Status  Partially Met      PT LONG TERM GOAL #2   Title  Patient will improve 5XSTS to <16 sec to demonstrate significant improvement in functional LE strength and improved ability to transfer    Baseline  5XSTS: 55sec; 08/12/2018: 20.8 sec; 09/23/24 12 sec    Time  6    Period  Weeks    Status  Achieved      PT LONG TERM GOAL #3   Title  Patient will have a worse pain in the back/LE to 2/10 in the past 2 weeks to demonstrate significant improvement in pain level and better ability to walk.     Baseline  worse pain: 5/10; worse pain 3/10    Time  6    Period  Weeks    Status  On-going            Plan - 09/25/18 1324    Clinical Impression Statement  Pt continues to demonstrate improved activity tolerance, mobility, and strength with continued progression of exercise. Today, pt demonstrated increased pain response initially but improved gradually with repeated lumbar motions (ext, rotations,  side-bending). Pt does still demonstrate increased difficulty maintaining upright stance, with increased R lateral shift. Pt continues to be able to tolerate 40# suitcase carry, standing rows 35#, and seated lat pull downs with 45# with no  sx exacerbation and improved fluidity of technique/performance, indicating improved postural strength of upper back. Pt able to perform 5x STS in 12 seconds, signifying a signicantly improved lower body strength/power, compared to initial onset of PT. Pt continues to demonstrate impaired lateral shift, impaired lumbar and hip mobility/strength, and activity tolerance with higher intensity of exercise. Pt will continue to benefit from skilled therapy treatment in order to return to prior level of function.    Personal Factors and Comorbidities  Age;Comorbidity 1    Comorbidities  DMII, Chronic pain     Examination-Activity Limitations  Carry;Bend;Lift;Stand    Examination-Participation Restrictions  Community Activity;Other   walking   Stability/Clinical Decision Making  Stable/Uncomplicated    Rehab Potential  Good    PT Frequency  2x / week    PT Duration  6 weeks    PT Treatment/Interventions  Patient/family education;Therapeutic exercise;Therapeutic activities;Neuromuscular re-education;Stair training;Electrical Stimulation;Moist Heat;Manual techniques;Dry needling;Scar mobilization;Joint Manipulations;Spinal Manipulations;Passive range of motion    PT Next Visit Plan  continue extension based exercise, address lateral shift in walking    PT Home Exercise Plan  see education section    Consulted and Agree with Plan of Care  Patient       Patient will benefit from skilled therapeutic intervention in order to improve the following deficits and impairments:  Abnormal gait, Decreased balance, Decreased endurance, Decreased mobility, Difficulty walking, Increased muscle spasms, Decreased range of motion, Obesity, Decreased activity tolerance, Decreased strength,  Pain  Visit Diagnosis: Muscle weakness (generalized)  Chronic bilateral low back pain, unspecified whether sciatica present     Problem List Patient Active Problem List   Diagnosis Date Noted   Pain due to onychomycosis of toenails of both feet 07/31/2018   Laceration of left little finger without foreign body without damage to nail 06/17/2018   Osteoarthritis of knee 05/23/2017   Cerumen impaction 05/20/2017   Ex-smoker 03/27/2017   Elevated PSA 03/27/2017   Personal history of colonic polyps    Stress due to family tension 05/15/2016   Skin rash 05/15/2016   Hx of colonic polyps    Benign neoplasm of transverse colon    Polyp of sigmoid colon    Benign neoplasm of descending colon    Chronic midline low back pain 02/27/2016   Bilateral hip pain 11/02/2015   Right knee pain 07/12/2015   Health maintenance examination 03/07/2015   Severe obesity (BMI 35.0-39.9) with comorbidity ([place]) 03/07/2015   Advanced care planning/counseling discussion 03/07/2015   Controlled diabetes mellitus type 2 with complications ([place]) 01/77/9390   Dyslipidemia associated with type 2 diabetes mellitus ([place]) 05/06/2014   OSA (obstructive sleep apnea) 05/06/2014   Fatty liver disease, nonalcoholic 30/09/2328   H/O malignant neoplasm of skin 01/01/2013   HTN (hypertension) 02/15/2009   CAD (coronary artery disease), native coronary artery 02/15/2009   Shortness of breath 02/15/2009    Scarlette Calico, SPT 09/25/2018, 8:57 AM  [facility] [address] [place], [address] Phone: [phone]   Fax:  [phone]  Name: Casey Golden MRN: [id number] Date of Birth: July 26, 1950

## 2018-10-14 ENCOUNTER — Other Ambulatory Visit: Payer: Self-pay | Admitting: Family Medicine

## 2018-10-16 ENCOUNTER — Other Ambulatory Visit: Payer: Self-pay

## 2018-10-16 ENCOUNTER — Ambulatory Visit (INDEPENDENT_AMBULATORY_CARE_PROVIDER_SITE_OTHER): Payer: Managed Care, Other (non HMO) | Admitting: Podiatry

## 2018-10-16 ENCOUNTER — Encounter: Payer: Self-pay | Admitting: Podiatry

## 2018-10-16 DIAGNOSIS — M2030 Hallux varus (acquired), unspecified foot: Secondary | ICD-10-CM

## 2018-10-16 DIAGNOSIS — E1142 Type 2 diabetes mellitus with diabetic polyneuropathy: Secondary | ICD-10-CM | POA: Diagnosis not present

## 2018-10-16 DIAGNOSIS — M79675 Pain in left toe(s): Secondary | ICD-10-CM | POA: Diagnosis not present

## 2018-10-16 DIAGNOSIS — B351 Tinea unguium: Secondary | ICD-10-CM | POA: Diagnosis not present

## 2018-10-16 DIAGNOSIS — M79674 Pain in right toe(s): Secondary | ICD-10-CM | POA: Diagnosis not present

## 2018-10-16 DIAGNOSIS — M2011 Hallux valgus (acquired), right foot: Secondary | ICD-10-CM

## 2018-10-16 NOTE — Progress Notes (Signed)
Complaint:  Visit Type: Patient returns to my office for continued preventative foot care services. Complaint: Patient states" my nails have grown long and thick and become painful to walk and wear shoes" Patient has been diagnosed with DM with neuropathy. The patient presents for preventative foot care services. No changes to ROS  Podiatric Exam: Vascular: dorsalis pedis and posterior tibial pulses are palpable bilateral. Capillary return is immediate. Temperature gradient is WNL. Skin turgor WNL  Sensorium: Absent  Semmes Weinstein monofilament test. Diminished  tactile sensation bilaterally. Nail Exam: Pt has thick disfigured discolored nails with subungual debris noted bilateral entire nail hallux through fifth toenails Ulcer Exam: There is no evidence of ulcer or pre-ulcerative changes or infection. Orthopedic Exam: Muscle tone and strength are WNL. No limitations in general ROM. No crepitus or effusions noted. HAV with hallux malleus. Tailors Bunion  B/L Skin: No Porokeratosis. No infection or ulcers  Diagnosis:  Onychomycosis, , Pain in right toe, pain in left toes,  DPN  HAV with hallux malleus.  Tailors bunion  B/L  Treatment & Plan Procedures and Treatment: Consent by patient was obtained for treatment procedures. The patient understood the discussion of treatment and procedures well. All questions were answered thoroughly reviewed. Debridement of mycotic and hypertrophic toenails, 1 through 5 bilateral and clearing of subungual debris. No ulceration, no infection noted. Patient is to see Rick for new diabetic shoes. Return Visit-Office Procedure: Patient instructed to return to the office for a follow up visit 3 months for continued evaluation and treatment.    Randon Somera DPM 

## 2018-10-22 ENCOUNTER — Telehealth: Payer: Self-pay | Admitting: Family Medicine

## 2018-10-22 NOTE — Telephone Encounter (Signed)
Received call from dermatology Dr Lillia Carmel. 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  25% association of internal malignancy disease at time of diagnosis rec considering further screening with colonoscopy, cystoscopy, PSA, EGD, consider CT chest/A/P.  Please call patient and schedule office visit to review dx, recommendations and further screening plan with patient.

## 2018-10-22 NOTE — Telephone Encounter (Signed)
Spoke with pt relaying Dr. Synthia Innocent message.  Pt scheduled on 10/24/18 at 7:15.

## 2018-10-24 ENCOUNTER — Encounter: Payer: Self-pay | Admitting: Family Medicine

## 2018-10-24 ENCOUNTER — Telehealth: Payer: Self-pay | Admitting: Family Medicine

## 2018-10-24 ENCOUNTER — Ambulatory Visit (INDEPENDENT_AMBULATORY_CARE_PROVIDER_SITE_OTHER): Payer: Managed Care, Other (non HMO) | Admitting: Family Medicine

## 2018-10-24 ENCOUNTER — Other Ambulatory Visit: Payer: Self-pay

## 2018-10-24 VITALS — BP 114/58 | HR 60 | Temp 97.7°F | Ht 68.5 in | Wt 245.5 lb

## 2018-10-24 DIAGNOSIS — C4499 Other specified malignant neoplasm of skin, unspecified: Secondary | ICD-10-CM | POA: Diagnosis not present

## 2018-10-24 DIAGNOSIS — I200 Unstable angina: Secondary | ICD-10-CM

## 2018-10-24 NOTE — Telephone Encounter (Signed)
error 

## 2018-10-24 NOTE — Progress Notes (Signed)
This visit was conducted in person.  BP (!) 114/58   Pulse 60   Temp 97.7 F (36.5 C)   Ht 5' 8.5" (1.74 m)   Wt 245 lb 8 oz (111.4 kg)   SpO2 98%   BMI 36.79 kg/m    CC: new diagnosis Subjective:    Patient ID: Casey Golden, male    DOB: September 17, 1950, 68 y.o.   MRN: [id number]  HPI: Casey Golden is a 68 y.o. male presenting on 10/24/2018 for Discuss new DX (Extramammary paget's disease )   Continues working at [employer]  He allows daughter Casey Golden to speak regarding PHI - advised to fill DPR up front.   I received phone call from dermatologist Dr Lillia Carmel regarding new diagnosis of extramammary paget's disease pending Moh's surgery. Concerning is that 25% of patients with this diagnosis can have internal malignancy at time of diagnosis. rec considering further screening with colonoscopy, cystoscopy, PSA, EGD, consider CT chest/A/P.  Denies abdominal discomfort, dysphagia, early satiety, fever/chills, unexpected weight loss, night sweats, hematuria, enlarged lymph nodes, n/v.   Endorses episodes of chest pains 3 months ago - saw cardiology, sent to ER with stable evaluation, declined hospitalization. Declined stress test. Denies further chest pain.   Per up to date: Screening and surveillance for tumors at other sites (breast, lung, colorectum, gastric, pancreas, and ovary) should be considered.  Preventative: COLONOSCOPY WITH PROPOFOL3/20/2018 TAs, high grade dysplasia with margins clear, diverticulosis Casey Lame, MD) COLONOSCOPY WITH PROPOFOL 08/07/2016 TAx1, diverticulosis, rpt 3 yrs (Casey Golden)- will call to double check.  Prostate cancer screening - Casey Golden at [facility] watching elevated PSA. S/p reassuring biopsy 2019 (1 apical core of atypia). Last seen 06/2018. Monitoring Q25mo  Lung cancer screening - does not meet criteria.  Bladder cancer screening - UA normal 06/2018.      Relevant past medical, surgical, family and social history reviewed and  updated as indicated. Interim medical history since our last visit reviewed. Allergies and medications reviewed and updated. Outpatient Medications Prior to Visit  Medication Sig Dispense Refill  . albuterol (PROVENTIL HFA;VENTOLIN HFA) 108 (90 Base) MCG/ACT inhaler INHALE 2 PUFFS BY MOUTH EVERY 6 HOURS AS NEEDED FOR WHEEZING 8.5 Inhaler 2  . aspirin 81 MG EC tablet Take 1 tablet (81 mg total) by mouth daily. 30 tablet 12  . atenolol (TENORMIN) 25 MG tablet TAKE 1 TABLET BY MOUTH EVERY DAY 90 tablet 3  . atorvastatin (LIPITOR) 20 MG tablet TAKE 1 TABLET BY MOUTH EVERY DAY 90 tablet 3  . Blood Glucose Monitoring Suppl (BLOOD GLUCOSE MONITOR SYSTEM) w/Device KIT 1 each by Does not apply route daily. Use as directed to check blood sugar once daily 1 each 0  . Cyanocobalamin (VITAMIN B 12 PO) Take 1 tablet by mouth daily.    . diclofenac sodium (VOLTAREN) 1 % GEL APPLY 2 G TOPICALLY 3 (THREE) TIMES DAILY AS NEEDED (ANTI INFLAMMATORY). 100 g 1  . gabapentin (NEURONTIN) 300 MG capsule TAKE 1 CAPSULE BY MOUTH TWICE A DAY - 1 IN THE MORNING AND 1 AT BEDTIME ALONG WITH 100 MG CAPSULE 180 capsule 3  . Glucose Blood (BLOOD GLUCOSE TEST STRIPS) STRP 1 each by In Vitro route daily. Use as directed to check blood sugar once daily 100 each 0  . ibuprofen (ADVIL,MOTRIN) 200 MG tablet Take 400-600 mg by mouth every 6 (six) hours as needed for mild pain (DEPENDS ON PAIN IF TAKES 400-600 MG).     . Lancets MISC 1 each  by Does not apply route daily. Use as directed to check blood sugar daily 100 each 0  . losartan (COZAAR) 50 MG tablet Take 1 tablet (50 mg total) by mouth 2 (two) times daily. 180 tablet 1  . metFORMIN (GLUCOPHAGE) 1000 MG tablet TAKE 1 TABLET BY MOUTH TWICE A DAY WITH FOOD 180 tablet 2   No facility-administered medications prior to visit.      Per HPI unless specifically indicated in ROS section below Review of Systems Objective:    BP (!) 114/58   Pulse 60   Temp 97.7 F (36.5 C)   Ht 5'  8.5" (1.74 m)   Wt 245 lb 8 oz (111.4 kg)   SpO2 98%   BMI 36.79 kg/m   Wt Readings from Last 3 Encounters:  10/24/18 245 lb 8 oz (111.4 kg)  07/23/18 245 lb (111.1 kg)  07/23/18 245 lb 1.9 oz (111.2 kg)    Physical Exam Vitals signs and nursing note reviewed.  Constitutional:      General: He is not in acute distress.    Appearance: Normal appearance. He is obese. He is not ill-appearing.  HENT:     Mouth/Throat:     Mouth: Mucous membranes are moist.     Pharynx: Oropharynx is clear. No posterior oropharyngeal erythema.  Eyes:     Extraocular Movements: Extraocular movements intact.     Conjunctiva/sclera: Conjunctivae normal.     Pupils: Pupils are equal, round, and reactive to light.  Cardiovascular:     Rate and Rhythm: Normal rate and regular rhythm.     Pulses: Normal pulses.     Heart sounds: Normal heart sounds. No murmur.  Pulmonary:     Effort: Pulmonary effort is normal. No respiratory distress.     Breath sounds: Normal breath sounds. No wheezing, rhonchi or rales.  Abdominal:     General: Abdomen is flat. Bowel sounds are normal. There is no distension.     Palpations: Abdomen is soft. There is no mass.     Tenderness: There is no abdominal tenderness. There is no right CVA tenderness, left CVA tenderness, guarding or rebound.     Hernia: No hernia is present.  Skin:    General: Skin is warm and dry.     Findings: No erythema or rash.  Neurological:     Mental Status: He is alert.  Psychiatric:        Mood and Affect: Mood normal.        Behavior: Behavior normal.       Results for orders placed or performed during the hospital encounter of 57/26/20  Basic metabolic panel  Result Value Ref Range   Sodium 139 135 - 145 mmol/L   Potassium 4.2 3.5 - 5.1 mmol/L   Chloride 101 98 - 111 mmol/L   CO2 27 22 - 32 mmol/L   Glucose, Bld 122 (H) 70 - 99 mg/dL   BUN 17 8 - 23 mg/dL   Creatinine, Ser 0.78 0.61 - 1.24 mg/dL   Calcium 9.1 8.9 - 10.3 mg/dL   GFR  calc non Af Amer >60 >60 mL/min   GFR calc Af Amer >60 >60 mL/min   Anion gap 11 5 - 15  CBC  Result Value Ref Range   WBC 9.5 4.0 - 10.5 K/uL   RBC 4.73 4.22 - 5.81 MIL/uL   Hemoglobin 15.9 13.0 - 17.0 g/dL   HCT 45.7 39.0 - 52.0 %   MCV 96.6 80.0 - 100.0 fL  MCH 33.6 26.0 - 34.0 pg   MCHC 34.8 30.0 - 36.0 g/dL   RDW 11.9 11.5 - 15.5 %   Platelets 246 150 - 400 K/uL   nRBC 0.0 0.0 - 0.2 %  Troponin I (High Sensitivity)  Result Value Ref Range   Troponin I (High Sensitivity) 4 <18 ng/L   Lab Results  Component Value Date   HGBA1C 6.5 05/22/2018    Assessment & Plan:   Problem List Items Addressed This Visit    Extramammary Paget's disease - Primary    New diagnosis.  Given possible associated internal malignancy, reviewed further screening options. Up to date on colon cancer screening, prostate cancer screening. UA has been normal pointing against bladder cancer concern. Discussed options of EGD with GI and pan CT vs PET scan. Does not have concerning symptoms. Desires PET scan which is reasonable - will order. RTC 4 mo close f/u.       Relevant Orders   NM PET Image Initial (PI) Skull Base To Thigh       No orders of the defined types were placed in this encounter.  Orders Placed This Encounter  Procedures  . NM PET Image Initial (PI) Skull Base To Thigh    Standing Status:   Future    Standing Expiration Date:   12/24/2019    Order Specific Question:   ** REASON FOR EXAM (FREE TEXT)    Answer:   Paget's extramammary disease    Order Specific Question:   If indicated for the ordered procedure, I authorize the administration of a radiopharmaceutical per Radiology protocol    Answer:   Yes    Order Specific Question:   Radiology Contrast Protocol - do NOT remove file path    Answer:   [email or web address]    Patient Instructions  We will call you for appointment for PET scan.  Good to see you today, call us with questions.  Return in 4  months for follow up visit.    Follow up plan: Return in about 4 months (around 02/23/2019) for follow up visit.  Ria Bush, MD

## 2018-10-24 NOTE — Patient Instructions (Addendum)
We will call you for appointment for PET scan.  Good to see you today, call us with questions.  Return in 4 months for follow up visit.

## 2018-10-24 NOTE — Assessment & Plan Note (Addendum)
New diagnosis.  Given possible associated internal malignancy, reviewed further screening options. Up to date on colon cancer screening, prostate cancer screening. UA has been normal pointing against bladder cancer concern. Discussed options of EGD with GI and pan CT vs PET scan. Does not have concerning symptoms. Desires PET scan which is reasonable - will order. RTC 4 mo close f/u.

## 2018-10-30 ENCOUNTER — Telehealth: Payer: Self-pay | Admitting: Family Medicine

## 2018-10-30 NOTE — Telephone Encounter (Signed)
Patient called today stating someone called him to cancel his PET scan. Patient said they told him the doctor no longer needed it      Please advise

## 2018-10-30 NOTE — Telephone Encounter (Signed)
I did not cancel this.  Rosaria Ferries can you check on this? Thanks.

## 2018-10-30 NOTE — Telephone Encounter (Signed)
Did you c/x pt's scan?

## 2018-10-31 ENCOUNTER — Other Ambulatory Visit: Payer: Self-pay | Admitting: Family Medicine

## 2018-10-31 ENCOUNTER — Telehealth: Payer: Self-pay | Admitting: Family Medicine

## 2018-10-31 DIAGNOSIS — C4499 Other specified malignant neoplasm of skin, unspecified: Secondary | ICD-10-CM

## 2018-10-31 NOTE — Telephone Encounter (Signed)
PET scan cancelled as insurance would not approve PET/CT which is apparently our only option now.  I have ordered pan CT.

## 2018-10-31 NOTE — Telephone Encounter (Signed)
[employer] would not Authorize the Pet/CT for the patient because they said that initially patient needs to have a CT Abd/Pel with contrast and a Chest CT with contrast first as indicated and then a Pet/CT could be considered if conventional imaging is unable to identify a primary site. I canceled the Pet/CT and patients wife was notified that you would be ordering the CT scans. Please order CT Abd/Pel with contrast and Chest CT with contrast and choose [facility] as the location as they said they could get him in next Tuesday.

## 2018-10-31 NOTE — Progress Notes (Signed)
Opened in error

## 2018-10-31 NOTE — Telephone Encounter (Signed)
Apparently [facility] canceled the test because they didn't think the test was Authorized but it was Authorized. I got the patient rescheduled for this Monday and patient has been notified.

## 2018-11-03 ENCOUNTER — Ambulatory Visit: Payer: Managed Care, Other (non HMO)

## 2018-11-03 ENCOUNTER — Other Ambulatory Visit (INDEPENDENT_AMBULATORY_CARE_PROVIDER_SITE_OTHER): Payer: Managed Care, Other (non HMO)

## 2018-11-03 ENCOUNTER — Other Ambulatory Visit: Payer: Self-pay

## 2018-11-03 ENCOUNTER — Other Ambulatory Visit: Payer: Self-pay | Admitting: Family Medicine

## 2018-11-03 DIAGNOSIS — I10 Essential (primary) hypertension: Secondary | ICD-10-CM

## 2018-11-03 DIAGNOSIS — Z0000 Encounter for general adult medical examination without abnormal findings: Secondary | ICD-10-CM

## 2018-11-03 LAB — CREATININE, SERUM: Creatinine, Ser: 0.82 mg/dL (ref 0.40–1.50)

## 2018-11-03 LAB — BUN: BUN: 15 mg/dL (ref 6–23)

## 2018-11-04 ENCOUNTER — Ambulatory Visit (INDEPENDENT_AMBULATORY_CARE_PROVIDER_SITE_OTHER)
Admission: RE | Admit: 2018-11-04 | Discharge: 2018-11-04 | Disposition: A | Discharge status: Home / Self Care | Payer: Managed Care, Other (non HMO) | Source: Ambulatory Visit | Attending: Family Medicine | Admitting: Family Medicine

## 2018-11-04 ENCOUNTER — Other Ambulatory Visit: Payer: Self-pay

## 2018-11-04 DIAGNOSIS — C4499 Other specified malignant neoplasm of skin, unspecified: Secondary | ICD-10-CM | POA: Diagnosis not present

## 2018-11-04 MED ORDER — IOHEXOL 300 MG/ML  SOLN
100.0000 mL | Freq: Once | INTRAMUSCULAR | Status: AC | PRN
  Administered 2018-11-04: 100 mL via INTRAVENOUS

## 2018-11-05 ENCOUNTER — Encounter: Payer: Self-pay | Admitting: Family Medicine

## 2018-11-05 DIAGNOSIS — IMO0001 Reserved for inherently not codable concepts without codable children: Secondary | ICD-10-CM

## 2018-11-05 DIAGNOSIS — I700 Atherosclerosis of aorta: Secondary | ICD-10-CM | POA: Insufficient documentation

## 2018-11-05 DIAGNOSIS — R911 Solitary pulmonary nodule: Secondary | ICD-10-CM

## 2018-11-06 ENCOUNTER — Encounter: Payer: Self-pay | Admitting: Family Medicine

## 2018-11-06 DIAGNOSIS — R911 Solitary pulmonary nodule: Secondary | ICD-10-CM | POA: Insufficient documentation

## 2018-11-19 ENCOUNTER — Other Ambulatory Visit: Payer: Self-pay

## 2018-11-19 ENCOUNTER — Ambulatory Visit: Payer: Medicare Other | Admitting: Orthotics

## 2018-11-19 DIAGNOSIS — B351 Tinea unguium: Secondary | ICD-10-CM

## 2018-11-19 DIAGNOSIS — M79674 Pain in right toe(s): Secondary | ICD-10-CM

## 2018-11-19 DIAGNOSIS — M2030 Hallux varus (acquired), unspecified foot: Secondary | ICD-10-CM

## 2018-11-19 DIAGNOSIS — E1142 Type 2 diabetes mellitus with diabetic polyneuropathy: Secondary | ICD-10-CM

## 2018-11-19 NOTE — Progress Notes (Signed)

## 2018-12-22 ENCOUNTER — Encounter: Payer: Self-pay | Admitting: Neurology

## 2018-12-22 ENCOUNTER — Ambulatory Visit (INDEPENDENT_AMBULATORY_CARE_PROVIDER_SITE_OTHER): Payer: No Typology Code available for payment source | Admitting: Neurology

## 2018-12-22 ENCOUNTER — Other Ambulatory Visit: Payer: Self-pay

## 2018-12-22 VITALS — BP 150/88 | HR 65 | Ht 68.5 in | Wt 244.0 lb

## 2018-12-22 DIAGNOSIS — G4733 Obstructive sleep apnea (adult) (pediatric): Secondary | ICD-10-CM

## 2018-12-22 DIAGNOSIS — I200 Unstable angina: Secondary | ICD-10-CM

## 2018-12-22 DIAGNOSIS — Z9989 Dependence on other enabling machines and devices: Secondary | ICD-10-CM | POA: Diagnosis not present

## 2018-12-22 NOTE — Progress Notes (Signed)
Subjective:    Golden ID: Casey Golden is a 68 y.o. male.  HPI     Interim history:   Casey Golden is a 68 year old right-handed gentleman with an underlying medical history of type 2 diabetes, allergic rhinitis, hypertension, hyperlipidemia, reflux disease, fatty liver, coronary artery disease, arthritis and obesity who presents for follow-up consultation of his obstructive sleep apnea, on CPAP therapy. Casey Golden is unaccompanied today and presents for his yearly checkup.  I last saw Casey Golden on 12/20/2018, at which time he was compliant with his CPAP which he started after his CPAP titration study.   Today, 12/22/2018: I reviewed his CPAP compliance data From 11/18/2018 through 12/17/2018 which is a total of 30 days, during which time he used his machine 28 days with percent use days greater than 4 hours at 80%, indicating very good compliance with an average usage of 5 hours and 24 minutes, residual AHI borderline at 5.2/h, leak on Casey high side with a 95th percentile at 21.1 L/min on a pressure of 7 cm with EPR of 3. He reports generally doing okay with his CPAP but sometimes Casey mask is uncomfortable as he had a skin cancer removed from Casey upper left lip area.  He has been seeing other specialists, he has a lesion in his groin that needs removing.  In Casey process of working this up he had a CT of his chest, abdomen and pelvis, he was told he had a spot on Casey lung which needs monitoring.  He is up-to-date with his CPAP related supplies and motivated to continue with treatment, he continues to benefit from it.  Casey Golden's allergies, current medications, family history, past medical history, past social history, past surgical history and problem list were reviewed and updated as appropriate.    Previously:     I saw Casey Golden on 07/01/2017, at which time he was compliant with his AutoPap. However, he was still struggling with tolerance, he was recently treated for an upper respiratory infection.     We did an overnight pulse oximetry test in Casey interim on 07/05/2017 which showed ongoing issues with desaturations at night with an O2 nadir of 81% and time below 89% saturation of nearly 12 minutes for Casey night. He was advised to return for a full night CPAP titration study. He had this on 08/14/2017. Sleep efficiency was 80.7%, sleep latency 12 minutes, REM latency 58 minutes. He was fitted with a nasal mask but was noted to have mouth venting. He declined a trial of full face mask. CPAP was started at 5 cm and advanced a 7 cm. On Casey final pressure his AHI was 0.4 per hour with supine REM sleep achieved an O2 nadir of 90%. Based on his test results I prescribed CPAP therapy for home use at a pressure of 7 cm.    I reviewed his CPAP compliance data from 11/18/2017 through 12/17/2017 which is a total of 30 days, during which time he used his CPAP 29 days with percent used days greater than 4 hours at 87%, indicating very good compliance with an average usage of 6 hours and 11 minutes, residual AHI borderline at 4.5 per hour, leak on Casey high side with Casey 95th percentile at 24.6 L/m on a pressure of 7 cm with EPR of 3.   I first met Casey Golden on 12/04/2016 at Casey request of his primary care physician, at which time Casey Golden reported a prior diagnosis of sleep apnea, he had a diagnostic test  in July 2015 which indicated mild sleep apnea he had a CPAP titration study in August 2015 which determined an adequate treatment pressure of 8 cm. He had not start CPAP therapy at Casey time. He had a home sleep test on 01/14/2017 which indicated mild sleep apnea with an AHI of 9.5 per hour, however desaturation nadir was 69% which was significant, and time below or at 88% saturation was 24 minutes for Casey test time of 7 hours and 45 minutes. He was advised to start AutoPap therapy.   I reviewed his AutoPap compliance data from 05/28/2017 through 06/26/2017 which is a total of 30 days, during which time he used his machine  every night with percent used days greater than 4 hours at 87%, indicating very good compliance however average usage of only 5 hours and 6 minutes, residual AHI borderline at 5.5 per hour, 95th percentile pressure at 9.2 cm, leak quite high with Casey 95th percentile at 46.7 L/m at a pressure range of 6 cm to 13 cm with EPR.    12/04/2016: (He) was previously diagnosed with obstructive sleep apnea. I reviewed his prior study results from 2015. He had a baseline sleep study on 07/29/2013 which showed an AHI of 11.3 per hour, 37.9 per hour during REM sleep. REM latency was 50 minutes, sleep efficiency was 80.3%. Average oxygen saturation was 96.8%, nadir was 72.6%. He had a PLM index of 51.2 with an associated arousal index of 0.5 per hour. He had a CPAP titration study on 09/16/2013. He was titrated from 5 cm to 10 cm. His optimal pressure was deemed 8 cm. His BMI at that time was 32. I reviewed your office note from 11/22/2016. He has not actually been on CPAP therapy. I reviewed your office note from 10/25/2016 as well. His Epworth sleepiness score is 10 out of 24 on Casey fatigue score is 15 out of 63. He lives at home with his wife. They have 3 children. He works at [employer] He quit smoking in 1990 and does not typically drink alcohol, drinks caffeine in Casey form of coffee, 5 cups a day on average. His bedtime is typically around 11. By that time he has typically already taken a short nap while watching TV. His wake up time is around 5, he helps out with his grandchildren, taking them to school. He denies restless leg symptoms. He has neuropathy secondary to diabetes and is on gabapentin. He has a history of pneumonia twice or 3 times in his life, also bronchitis. He denies morning headaches but has nocturia about once or twice per average night.  His Past Medical History Is Significant For: Past Medical History:  Diagnosis Date  . Arthritis   . CAD (coronary artery disease)    cathx3 with  nonobstructive disease. Last cath with RCA 40% stenosis in 2009  . Cataract 2019   bilateral; resolved with surgery  . Colonic polyp   . Fatty liver disease, nonalcoholic 3149   by [place]  . GERD (gastroesophageal reflux disease)   . History of chicken pox   . Hyperlipidemia   . Hypertension   . Nocturia   . OSA (obstructive sleep apnea)    CPAP, compliant  . Past use of tobacco    Quit 1990, 70 pack year history  . Rash of genital area    09-08-2014  per pt Dr Junious Silk aware  . Seasonal and perennial allergic rhinitis   . Type 2 diabetes mellitus ([place])   . Wears  dentures    full upper/  partial lower  . Wears glasses     His Past Surgical History Is Significant For: Past Surgical History:  Procedure Laterality Date  . ABDOMINAL HERNIA REPAIR  2007      [facility]   open repair  . APPENDECTOMY  age 37  . CARDIAC CATHETERIZATION  12-23-2007   [facility]   Abnormal myoview w/ ischemia/  40% mRCA with nonobstructive and no sig. plaque in his left system, EF 55%  . CARDIAC CATHETERIZATION  Apr 2008    [facility]   Abnormal myoview/  50% RCA,  ef 65%  . CARDIAC CATHETERIZATION  1999      [facility]  . CATARACT EXTRACTION, BILATERAL Bilateral 11/2017  . CERVICAL FUSION  1992  . CHOLECYSTECTOMY OPEN  2006  . COLONOSCOPY WITH PROPOFOL N/A 04/17/2016   TAs, high grade dysplasia with margins clear, diverticulosis Lucilla Lame, MD)  . COLONOSCOPY WITH PROPOFOL N/A 08/07/2016   TAx1, diverticulosis, rpt 3 yrs ([place])  . DENTAL SURGERY     metal dental implant L mandible  . EXCISION OF SKIN TAG Right 09/14/2014   Procedure: EXCISION OF SKIN TAG;  Surgeon: Festus Aloe, MD;  Location[facility];  Service: Urology;  Laterality: Right;  . HYDROCELE EXCISION Left 09/14/2014   Procedure: LEFT HYDROCELECTOMY ADULT;  Surgeon: Festus Aloe, MD;  Location: [facility];  Service: Urology;  Laterality: Left;  . MOHS SURGERY  2015   skin cancer  . TONSILLECTOMY  age 23    His  Family History Is Significant For: Family History  Problem Relation Age of Onset  . CAD Mother        MI  . Diabetes Mother   . Stroke Mother        mini-stroke  . Cancer Father 91       lung (smoker)  . Heart disease Paternal Grandmother   . Heart disease Paternal Grandfather   . Diabetes Paternal Grandfather   . Cancer Sister        lung  . Coronary artery disease Neg Hx        Premature    His Social History Is Significant For: Social History   Socioeconomic History  . Marital status: Married    Spouse name: Not on file  . Number of children: Not on file  . Years of education: Not on file  . Highest education level: Not on file  Occupational History  . Occupation: Full time    Employer: [employer]  Social Needs  . Financial resource strain: Not on file  . Food insecurity    Worry: Not on file    Inability: Not on file  . Transportation needs    Medical: Not on file    Non-medical: Not on file  Tobacco Use  . Smoking status: Former Smoker    Packs/day: 2.00    Years: 35.00    Pack years: 70.00    Types: Cigarettes    Quit date: 01/29/1990    Years since quitting: 28.9  . Smokeless tobacco: Never Used  Substance and Sexual Activity  . Alcohol use: No  . Drug use: No  . Sexual activity: Yes  Lifestyle  . Physical activity    Days per week: Not on file    Minutes per session: Not on file  . Stress: Not on file  Relationships  . Social Herbalist on phone: Not on file    Gets together:  Not on file    Attends religious service: Not on file    Active member of club or organization: Not on file    Attends meetings of clubs or organizations: Not on file    Relationship status: Not on file  Other Topics Concern  . Not on file  Social History Narrative   Lives with wife, dog and cats    Occupation: retired, was self employed, now works at [employer]    Edu: HS   Activity: walks 1.5 mi daily   Diet: some water, fruits/vegetables daily     His Allergies Are:  Allergies  Allergen Reactions  . Sulfa Antibiotics Hives  . Sulfur   :   His Current Medications Are:  Outpatient Encounter Medications as of 12/22/2018  Medication Sig  . albuterol (PROVENTIL HFA;VENTOLIN HFA) 108 (90 Base) MCG/ACT inhaler INHALE 2 PUFFS BY MOUTH EVERY 6 HOURS AS NEEDED FOR WHEEZING  . aspirin 81 MG EC tablet Take 1 tablet (81 mg total) by mouth daily.  Marland Kitchen atenolol (TENORMIN) 25 MG tablet TAKE 1 TABLET BY MOUTH EVERY DAY  . atorvastatin (LIPITOR) 20 MG tablet TAKE 1 TABLET BY MOUTH EVERY DAY  . Blood Glucose Monitoring Suppl (BLOOD GLUCOSE MONITOR SYSTEM) w/Device KIT 1 each by Does not apply route daily. Use as directed to check blood sugar once daily  . diclofenac sodium (VOLTAREN) 1 % GEL APPLY 2 G TOPICALLY 3 (THREE) TIMES DAILY AS NEEDED (ANTI INFLAMMATORY).  Marland Kitchen gabapentin (NEURONTIN) 300 MG capsule TAKE 1 CAPSULE BY MOUTH TWICE A DAY - 1 IN Casey MORNING AND 1 AT BEDTIME ALONG WITH 100 MG CAPSULE  . Glucose Blood (BLOOD GLUCOSE TEST STRIPS) STRP 1 each by In Vitro route daily. Use as directed to check blood sugar once daily  . ibuprofen (ADVIL,MOTRIN) 200 MG tablet Take 400-600 mg by mouth every 6 (six) hours as needed for mild pain (DEPENDS ON PAIN IF TAKES 400-600 MG).   . Lancets MISC 1 each by Does not apply route daily. Use as directed to check blood sugar daily  . losartan (COZAAR) 50 MG tablet Take 1 tablet (50 mg total) by mouth 2 (two) times daily.  . metFORMIN (GLUCOPHAGE) 1000 MG tablet TAKE 1 TABLET BY MOUTH TWICE A DAY WITH FOOD  . [DISCONTINUED] Cyanocobalamin (VITAMIN B 12 PO) Take 1 tablet by mouth daily.   No facility-administered encounter medications on file as of 12/22/2018.   :  Review of Systems:  Out of a complete 14 point review of systems, all are reviewed and negative with Casey exception of these symptoms as listed below: Review of Systems  Neurological:       Pt presents today to discuss his cpap. Pt had a recent  surgery on his lip which Casey mask irritates. Otherwise, it is going well.    Objective:  Neurological Exam  Physical Exam Physical Examination:   Vitals:   12/22/18 1127  BP: (!) 150/88  Pulse: 65    General Examination: Casey Golden is a very pleasant 68 y.o. male in no acute distress. He appears well-developed and well-nourished and well groomed.   HEENT:Normocephalic, atraumatic, pupils are equal, round and reactive to light, corrective eye glasses in place.Extraocular tracking is good without limitation to gaze excursion or nystagmus noted. Normal smooth pursuit is noted. Hearing ismildly impaired.Face is symmetric with normal facial animation and normal facial sensation. Speech is clear with no dysarthria noted. There is no hypophonia. There is no lip, neck/head, jaw or voice  tremor. Neck with FROM. Small scar left upper lip area. Oropharynx exam reveals:mildmouth dryness, adequatedental hygiene with full dentures on top and partials on Casey bottom,and moderateairway crowding. Tongue protrudes centrally and palate elevates symmetrically. Tonsils are absent.   Chest:Clear to auscultation without wheezing, rhonchi or crackles noted.  Heart:S1+S2+0, regular and normal without murmurs, rubs or gallops noted.   Abdomen:Soft, non-tender and non-distended with normal bowel sounds appreciated on auscultation.  Extremities:There isnopitting edema in Casey distal lower extremities.  Skin: Warm and dry without trophic changes noted. Reports having a lesion in Casey groin.  This will be surgically removed next month.  Musculoskeletal: exam reveals no obvious joint deformities, tenderness or joint swelling or erythema.   Neurologically:  Mental status: Casey Golden is awake, alert and oriented in all 4 spheres.Hisimmediate and remote memory, attention, language skills and fund of knowledge are appropriate. There is no evidence of aphasia, agnosia, apraxia or anomia. Speech  is clear with normal prosody and enunciation. Thought process is linear. Mood is normaland affect is normal.  Cranial nerves II - XII are as described above under HEENT exam.  Motor exam: Normal bulk, strength and tone is noted. There is notremor.Fine motor skills and coordination:grosslyintact.  Cerebellar testing: No dysmetria or intention tremor. There is no truncal or gait ataxia.  Sensory exam: intact to light touch inthe upper and lower extremities.  Assessmentand Plan:  In summary,Casey Golden a very pleasant 3 year oldmalewith an underlying medical history of type 2 diabetes, allergic rhinitis, hypertension, hyperlipidemia, reflux disease, fatty liver, coronary artery disease, arthritis and obesity whopresents for follow-up consultation of his obstructive sleep apnea. He carries a prior diagnosis of sleep apnea. Mild sleep apnea was confirmed via home sleep testing on 01/14/2017 which indicated an AHI of 9.5 per hour, average oxygen saturation of 93% but O2 nadir was 69% with time below 89% saturation of 24 minutes for Casey night. He had been compliant with AutoPap therapy but it interim pulse oximetry test while in treatment at home showed ongoing desaturations. He was therefore advised to come back for a full night titration study. He has Been on CPAP therapy of 7 cm ever since his titration study in July 2019, on 08/14/2017.  He continues to be compliant with CPAP, he had interim basal cell cancer removal from above his left upper lip, he uses nasal pillows and sometimes Casey area of Casey skin cancer removal becomes irritated.  Other than that, he has another surgery coming up next month for a lesion that needs to be removed from his groin.  He is Up-to-date with his CPAP related supplies.  He continues to do well with it.  He is commended for his treatment adherence and advised to follow-up routinely to see Casey nurse practitioner in 1 year.  I answered all his questions today and he  was in agreement. I spent 15 minutes in total face-to-face time with Casey Golden, more than 50% of which was spent in counseling and coordination of care, reviewing test results, reviewing medication and discussing or reviewing Casey diagnosis of OSA, its prognosis and treatment options. Pertinent laboratory and imaging test results that were available during this visit with Casey Golden were reviewed by me and considered in my medical decision making (see chart for details).

## 2018-12-22 NOTE — Patient Instructions (Signed)
It was good to see you today, good luck with your upcoming procedure.   Please continue using your CPAP regularly. While your insurance requires that you use CPAP at least 4 hours each night on 70% of the nights, I recommend, that you not skip any nights and use it throughout the night if you can. Getting used to CPAP and staying with the treatment long term does take time and patience and discipline. Untreated obstructive sleep apnea when it is moderate to severe can have an adverse impact on cardiovascular health and raise her risk for heart disease, arrhythmias, hypertension, congestive heart failure, stroke and diabetes. Untreated obstructive sleep apnea causes sleep disruption, nonrestorative sleep, and sleep deprivation. This can have an impact on your day to day functioning and cause daytime sleepiness and impairment of cognitive function, memory loss, mood disturbance, and problems focussing. Using CPAP regularly can improve these symptoms.  We can see you in 1 year, you can see one of our nurse practitioners as you are stable.

## 2018-12-26 ENCOUNTER — Ambulatory Visit
Admission: EM | Admit: 2018-12-26 | Discharge: 2018-12-26 | Disposition: A | Discharge status: Home / Self Care | Payer: Managed Care, Other (non HMO) | Attending: Emergency Medicine | Admitting: Emergency Medicine

## 2018-12-26 DIAGNOSIS — R05 Cough: Secondary | ICD-10-CM | POA: Diagnosis not present

## 2018-12-26 DIAGNOSIS — R059 Cough, unspecified: Secondary | ICD-10-CM

## 2018-12-26 DIAGNOSIS — J0100 Acute maxillary sinusitis, unspecified: Secondary | ICD-10-CM | POA: Diagnosis not present

## 2018-12-26 MED ORDER — AMOXICILLIN-POT CLAVULANATE 875-125 MG PO TABS: 1.0000 | ORAL_TABLET | Freq: Two times a day (BID) | ORAL | 0 refills | Status: DC

## 2018-12-26 NOTE — Discharge Instructions (Signed)
Take the antibiotic as directed.    Your COVID test is pending.  You should self quarantine until your test result is back and is negative.    Go to the emergency department if you develop high fever, shortness of breath, severe diarrhea, or other concerning symptoms.

## 2018-12-26 NOTE — ED Triage Notes (Signed)
Pt presents with complaints of cough, sore throat and nasal congestion that started on Tuesday. Reports trying otc treatment with some relief.

## 2018-12-26 NOTE — ED Provider Notes (Signed)
Roderic Palau    CSN: [id number] Arrival date & time: 12/26/18  1107      History   Chief Complaint Chief Complaint  Patient presents with  . Nasal Congestion  . Cough    HPI Casey Golden is a 68 y.o. male.   Patient presents with sinus congestion, sore throat, cough productive of yellow phlegm x4 days.  Denies fever, chills, difficulty swallowing, shortness of breath, vomiting, diarrhea, rash, or other symptoms.  He has attempted treatment at home with OTC cough medication with moderate relief.  The history is provided by the patient.    Past Medical History:  Diagnosis Date  . Arthritis   . CAD (coronary artery disease)    cathx3 with nonobstructive disease. Last cath with RCA 40% stenosis in 2009  . Cataract 2019   bilateral; resolved with surgery  . Colonic polyp   . Fatty liver disease, nonalcoholic 5784   by [place]  . GERD (gastroesophageal reflux disease)   . History of chicken pox   . Hyperlipidemia   . Hypertension   . Nocturia   . OSA (obstructive sleep apnea)    CPAP, compliant  . Past use of tobacco    Quit 1990, 70 pack year history  . Rash of genital area    09-08-2014  per pt Dr Junious Silk aware  . Seasonal and perennial allergic rhinitis   . Type 2 diabetes mellitus ([place])   . Wears dentures    full upper/  partial lower  . Wears glasses     Patient Active Problem List   Diagnosis Date Noted  . Pulmonary nodule less than 6 cm determined by computed tomography of lung 11/06/2018  . Atherosclerosis of aorta ([place]) 11/05/2018  . Extramammary Paget's disease 10/24/2018  . Pain due to onychomycosis of toenails of both feet 07/31/2018  . Laceration of left little finger without foreign body without damage to nail 06/17/2018  . Osteoarthritis of knee 05/23/2017  . Cerumen impaction 05/20/2017  . Ex-smoker 03/27/2017  . Elevated PSA 03/27/2017  . Personal history of colonic polyps   . Stress due to family tension 05/15/2016  . Skin rash  05/15/2016  . Hx of colonic polyps   . Benign neoplasm of transverse colon   . Polyp of sigmoid colon   . Benign neoplasm of descending colon   . Chronic midline low back pain 02/27/2016  . Bilateral hip pain 11/02/2015  . Right knee pain 07/12/2015  . Health maintenance examination 03/07/2015  . Severe obesity (BMI 35.0-39.9) with comorbidity ([place]) 03/07/2015  . Advanced care planning/counseling discussion 03/07/2015  . Controlled diabetes mellitus type 2 with complications ([place]) 69/62/9528  . Dyslipidemia associated with type 2 diabetes mellitus ([place]) 05/06/2014  . OSA (obstructive sleep apnea) 05/06/2014  . Fatty liver disease, nonalcoholic 41/32/4401  . H/O malignant neoplasm of skin 01/01/2013  . HTN (hypertension) 02/15/2009  . CAD (coronary artery disease), native coronary artery 02/15/2009  . Shortness of breath 02/15/2009    Past Surgical History:  Procedure Laterality Date  . ABDOMINAL HERNIA REPAIR  2007      [facility]   open repair  . APPENDECTOMY  age 53  . CARDIAC CATHETERIZATION  12-23-2007   [facility]   Abnormal myoview w/ ischemia/  40% mRCA with nonobstructive and no sig. plaque in his left system, EF 55%  . CARDIAC CATHETERIZATION  Apr 2008    [facility]   Abnormal myoview/  50% RCA,  ef 65%  . CARDIAC CATHETERIZATION  1999      [facility]  . CATARACT EXTRACTION, BILATERAL Bilateral 11/2017  . CERVICAL FUSION  1992  . CHOLECYSTECTOMY OPEN  2006  . COLONOSCOPY WITH PROPOFOL N/A 04/17/2016   TAs, high grade dysplasia with margins clear, diverticulosis Lucilla Lame, MD)  . COLONOSCOPY WITH PROPOFOL N/A 08/07/2016   TAx1, diverticulosis, rpt 3 yrs ([facility])  . DENTAL SURGERY     metal dental implant L mandible  . EXCISION OF SKIN TAG Right 09/14/2014   Procedure: EXCISION OF SKIN TAG;  Surgeon: Festus Aloe, MD;  Location: [facility];  Service: Urology;  Laterality: Right;  . HYDROCELE EXCISION Left 09/14/2014   Procedure: LEFT HYDROCELECTOMY ADULT;  Surgeon:  Festus Aloe, MD;  Location: [facility];  Service: Urology;  Laterality: Left;  . MOHS SURGERY  2015   skin cancer  . TONSILLECTOMY  age 24       Home Medications    Prior to Admission medications   Medication Sig Start Date End Date Taking? Authorizing Provider  albuterol (PROVENTIL HFA;VENTOLIN HFA) 108 (90 Base) MCG/ACT inhaler INHALE 2 PUFFS BY MOUTH EVERY 6 HOURS AS NEEDED FOR WHEEZING 03/27/18   Ria Bush, MD  amoxicillin-clavulanate (AUGMENTIN) 875-125 MG tablet Take 1 tablet by mouth every 12 (twelve) hours. 12/26/18   Sharion Balloon, NP  aspirin 81 MG EC tablet Take 1 tablet (81 mg total) by mouth daily. 09/17/14   Festus Aloe, MD  atenolol (TENORMIN) 25 MG tablet TAKE 1 TABLET BY MOUTH EVERY DAY 09/23/17   Minna Merritts, MD  atorvastatin (LIPITOR) 20 MG tablet TAKE 1 TABLET BY MOUTH EVERY DAY 04/09/18   Minna Merritts, MD  Blood Glucose Monitoring Suppl (BLOOD GLUCOSE MONITOR SYSTEM) w/Device KIT 1 each by Does not apply route daily. Use as directed to check blood sugar once daily 07/24/17   Ria Bush, MD  diclofenac sodium (VOLTAREN) 1 % GEL APPLY 2 G TOPICALLY 3 (THREE) TIMES DAILY AS NEEDED (ANTI INFLAMMATORY). 07/24/17   Ria Bush, MD  gabapentin (NEURONTIN) 300 MG capsule TAKE 1 CAPSULE BY MOUTH TWICE A DAY - 1 IN THE MORNING AND 1 AT BEDTIME ALONG WITH 100 MG CAPSULE 01/17/18   Hyatt, Max T, DPM  Glucose Blood (BLOOD GLUCOSE TEST STRIPS) STRP 1 each by In Vitro route daily. Use as directed to check blood sugar once daily 07/24/17   Ria Bush, MD  ibuprofen (ADVIL,MOTRIN) 200 MG tablet Take 400-600 mg by mouth every 6 (six) hours as needed for mild pain (DEPENDS ON PAIN IF TAKES 400-600 MG).     [provider]  Lancets MISC 1 each by Does not apply route daily. Use as directed to check blood sugar daily 07/24/17   Ria Bush, MD  losartan (COZAAR) 50 MG tablet Take 1 tablet (50 mg total) by mouth 2 (two)  times daily. 07/21/18   Minna Merritts, MD  metFORMIN (GLUCOPHAGE) 1000 MG tablet TAKE 1 TABLET BY MOUTH TWICE A DAY WITH FOOD 10/14/18   Ria Bush, MD    Family History Family History  Problem Relation Age of Onset  . CAD Mother        MI  . Diabetes Mother   . Stroke Mother        mini-stroke  . Cancer Father 34       lung (smoker)  . Heart disease Paternal Grandmother   . Heart disease Paternal Grandfather   . Diabetes Paternal Grandfather   . Cancer Sister  lung  . Coronary artery disease Neg Hx        Premature    Social History Social History   Tobacco Use  . Smoking status: Former Smoker    Packs/day: 2.00    Years: 35.00    Pack years: 70.00    Types: Cigarettes    Quit date: 01/29/1990    Years since quitting: 28.9  . Smokeless tobacco: Never Used  Substance Use Topics  . Alcohol use: No  . Drug use: No     Allergies   Sulfa antibiotics and Sulfur   Review of Systems Review of Systems  Constitutional: Negative for chills and fever.  HENT: Positive for congestion, rhinorrhea, sinus pressure and sore throat. Negative for ear pain.   Eyes: Negative for pain and visual disturbance.  Respiratory: Positive for cough. Negative for shortness of breath.   Cardiovascular: Negative for chest pain and palpitations.  Gastrointestinal: Negative for abdominal pain, diarrhea, nausea and vomiting.  Genitourinary: Negative for dysuria and hematuria.  Musculoskeletal: Negative for arthralgias and back pain.  Skin: Negative for color change and rash.  Neurological: Negative for seizures and syncope.  All other systems reviewed and are negative.    Physical Exam Triage Vital Signs ED Triage Vitals  Enc Vitals Group     BP 12/26/18 1107 (!) 145/85     Pulse Rate 12/26/18 1107 84     Resp 12/26/18 1107 18     Temp 12/26/18 1107 98 F (36.7 C)     Temp src --      SpO2 --      Weight --      Height --      Head Circumference --      Peak Flow  --      Pain Score 12/26/18 1106 2     Pain Loc --      Pain Edu? --      Excl. in [place]? --    No data found.  Updated Vital Signs BP (!) 145/85   Pulse 84   Temp 98 F (36.7 C)   Resp 18   Visual Acuity Right Eye Distance:   Left Eye Distance:   Bilateral Distance:    Right Eye Near:   Left Eye Near:    Bilateral Near:     Physical Exam Vitals signs and nursing note reviewed.  Constitutional:      General: He is not in acute distress.    Appearance: He is well-developed. He is not ill-appearing.  HENT:     Head: Normocephalic and atraumatic.     Right Ear: Tympanic membrane normal.     Left Ear: Tympanic membrane normal.     Nose: Congestion present.     Mouth/Throat:     Mouth: Mucous membranes are moist.     Pharynx: Oropharynx is clear.  Eyes:     Conjunctiva/sclera: Conjunctivae normal.  Neck:     Musculoskeletal: Neck supple.  Cardiovascular:     Rate and Rhythm: Normal rate and regular rhythm.     Heart sounds: No murmur.  Pulmonary:     Effort: Pulmonary effort is normal. No respiratory distress.     Breath sounds: Normal breath sounds.  Abdominal:     General: Bowel sounds are normal.     Palpations: Abdomen is soft.     Tenderness: There is no abdominal tenderness. There is no guarding or rebound.  Skin:    General: Skin is warm and dry.  Findings: No rash.  Neurological:     General: No focal deficit present.     Mental Status: He is alert and oriented to person, place, and time.      UC Treatments / Results  Labs (all labs ordered are listed, but only abnormal results are displayed) Labs Reviewed  NOVEL CORONAVIRUS, NAA    EKG   Radiology No results found.  Procedures Procedures (including critical care time)  Medications Ordered in UC Medications - No data to display  Initial Impression / Assessment and Plan / UC Course  I have reviewed the triage vital signs and the nursing notes.  Pertinent labs & imaging results that  were available during my care of the patient were reviewed by me and considered in my medical decision making (see chart for details).    Acute maxillary sinusitis, cough.  Treating with Augmentin.  COVID test performed here.  Instructed patient to self quarantine until the test result is back.  Instructed patient to go to the emergency department if develops high fever, shortness of breath, severe diarrhea, or other concerning symptoms.  Patient agrees with plan of care.    Final Clinical Impressions(s) / UC Diagnoses   Final diagnoses:  Cough  Acute non-recurrent maxillary sinusitis     Discharge Instructions     Take the antibiotic as directed.    Your COVID test is pending.  You should self quarantine until your test result is back and is negative.    Go to the emergency department if you develop high fever, shortness of breath, severe diarrhea, or other concerning symptoms.       ED Prescriptions    Medication Sig Dispense Auth. Provider   amoxicillin-clavulanate (AUGMENTIN) 875-125 MG tablet Take 1 tablet by mouth every 12 (twelve) hours. 14 tablet Sharion Balloon, NP     PDMP not reviewed this encounter.   Sharion Balloon, NP 12/26/18 [phone]

## 2018-12-29 ENCOUNTER — Encounter: Payer: Self-pay | Admitting: Family Medicine

## 2018-12-29 LAB — NOVEL CORONAVIRUS, NAA: SARS-CoV-2, NAA: NOT DETECTED

## 2019-01-01 ENCOUNTER — Telehealth: Payer: Self-pay

## 2019-01-01 NOTE — Telephone Encounter (Signed)
Spoke with pt relaying Dr. Synthia Innocent message.  Pt verbalizes understanding and will go back to UC for recheck.

## 2019-01-01 NOTE — Telephone Encounter (Signed)
Pt said was tested neg for covid; pt seen [facility] in [place] on 12/26/18 and pt finishes augmentin to night. Pt still has chest congestion; prod cough with yellow phlegm,S/T, SOB on and off whether sitting or upon exertion.on and off pt does have dull CP and last CP was about 2 hrs ago after coughing episode. Pt cannot say if CP is worse when taking deep breath. Pt said does not think any heart issues. No fever. Pt has doctors appt in [place] at 10:30 on 01/02/19. Pt said since neg covid pt wants to know if could come in office for appt with DR G tomorrow afternoon. Pt request cb.

## 2019-01-01 NOTE — Telephone Encounter (Signed)
Agree with OV but will need virtual given ongoing cough.  Alternatively should return to [facility] for recheck.

## 2019-01-08 ENCOUNTER — Ambulatory Visit
Admission: EM | Admit: 2019-01-08 | Discharge: 2019-01-08 | Disposition: A | Discharge status: Home / Self Care | Payer: Managed Care, Other (non HMO) | Attending: Physician Assistant | Admitting: Physician Assistant

## 2019-01-08 ENCOUNTER — Encounter: Payer: Self-pay | Admitting: *Deleted

## 2019-01-08 DIAGNOSIS — J0100 Acute maxillary sinusitis, unspecified: Secondary | ICD-10-CM | POA: Diagnosis not present

## 2019-01-08 MED ORDER — DOXYCYCLINE HYCLATE 100 MG PO CAPS: 100.0000 mg | ORAL_CAPSULE | Freq: Two times a day (BID) | ORAL | 0 refills | Status: DC

## 2019-01-08 NOTE — ED Triage Notes (Signed)
Patient was seen on 11/27 for nasal congestion, was dx with sinusitis. Negative covid. Patient denies fever. Reports that he was feeling better and then once the medication was gone it came back. Patient reports large amount of drainage. Patient reports drainage into throat causing scratchy throat.

## 2019-01-08 NOTE — Discharge Instructions (Addendum)
Take medication as prescribed. Continue to utilize Mucinex as directed on packaging. Drink plenty of fluids.  If there is no improvement follow up with Primary Care provider or return here.

## 2019-01-08 NOTE — ED Provider Notes (Signed)
Casey Golden    CSN: [id number] Arrival date & time: 01/08/19  1022      History   Chief Complaint Chief Complaint  Patient presents with   Nasal Congestion    HPI Casey Golden is a 68 y.o. male.   Patient comes back to urgent care for continued nasal drainage and facial pain. He was seen in this urgent care on 11/27 and diagnosed and treated for Acute maxillary sinusitis. He was Covid negative and completed course of Augmentin. He had some improvement in symptoms up until completion of the augmentin but had immediate increase in symptoms following. He has utilized mucinex throughout the course of his illness. He endorses continued green drainage and a minor cough with green mucous and associated facial pain. He denies fever, chills, SOB, nausea or abdominal pain and headache.      Past Medical History:  Diagnosis Date   Arthritis    CAD (coronary artery disease)    cathx3 with nonobstructive disease. Last cath with RCA 40% stenosis in 2009   Cataract 2019   bilateral; resolved with surgery   Colonic polyp    Fatty liver disease, nonalcoholic 9381   by [place]   GERD (gastroesophageal reflux disease)    History of chicken pox    Hyperlipidemia    Hypertension    Nocturia    OSA (obstructive sleep apnea)    CPAP, compliant   Past use of tobacco    Quit 1990, 70 pack year history   Rash of genital area    09-08-2014  per pt Dr Junious Silk aware   Seasonal and perennial allergic rhinitis    Type 2 diabetes mellitus ([place])    Wears dentures    full upper/  partial lower   Wears glasses     Patient Active Problem List   Diagnosis Date Noted   Pulmonary nodule less than 6 cm determined by computed tomography of lung 11/06/2018   Atherosclerosis of aorta ([place]) 11/05/2018   Extramammary Paget's disease 10/24/2018   Pain due to onychomycosis of toenails of both feet 07/31/2018   Laceration of left little finger without foreign body without  damage to nail 06/17/2018   Osteoarthritis of knee 05/23/2017   Cerumen impaction 05/20/2017   Ex-smoker 03/27/2017   Elevated PSA 03/27/2017   Personal history of colonic polyps    Stress due to family tension 05/15/2016   Skin rash 05/15/2016   Hx of colonic polyps    Benign neoplasm of transverse colon    Polyp of sigmoid colon    Benign neoplasm of descending colon    Chronic midline low back pain 02/27/2016   Bilateral hip pain 11/02/2015   Right knee pain 07/12/2015   Health maintenance examination 03/07/2015   Severe obesity (BMI 35.0-39.9) with comorbidity ([place]) 03/07/2015   Advanced care planning/counseling discussion 03/07/2015   Controlled diabetes mellitus type 2 with complications ([place]) 01/75/1025   Dyslipidemia associated with type 2 diabetes mellitus ([place]) 05/06/2014   OSA (obstructive sleep apnea) 05/06/2014   Fatty liver disease, nonalcoholic 85/27/7824   H/O malignant neoplasm of skin 01/01/2013   HTN (hypertension) 02/15/2009   CAD (coronary artery disease), native coronary artery 02/15/2009   Shortness of breath 02/15/2009    Past Surgical History:  Procedure Laterality Date   ABDOMINAL HERNIA REPAIR  2007      [facility]   open repair   APPENDECTOMY  age 48   CARDIAC CATHETERIZATION  12-23-2007   [facility]   Abnormal  myoview w/ ischemia/  40% mRCA with nonobstructive and no sig. plaque in his left system, EF 55%   CARDIAC CATHETERIZATION  Apr 2008    [facility]   Abnormal myoview/  50% RCA,  ef 65%   CARDIAC CATHETERIZATION  1999      [facility]   CATARACT EXTRACTION, BILATERAL Bilateral 11/2017   CERVICAL FUSION  1992   CHOLECYSTECTOMY OPEN  2006   COLONOSCOPY WITH PROPOFOL N/A 04/17/2016   TAs, high grade dysplasia with margins clear, diverticulosis Lucilla Lame, MD)   COLONOSCOPY WITH PROPOFOL N/A 08/07/2016   TAx1, diverticulosis, rpt 3 yrs ([facility])   DENTAL SURGERY     metal dental implant L mandible   EXCISION OF SKIN TAG Right  09/14/2014   Procedure: EXCISION OF SKIN TAG;  Surgeon: Festus Aloe, MD;  Location: [facility];  Service: Urology;  Laterality: Right;   HYDROCELE EXCISION Left 09/14/2014   Procedure: LEFT HYDROCELECTOMY ADULT;  Surgeon: Festus Aloe, MD;  Location: [facility];  Service: Urology;  Laterality: Left;   MOHS SURGERY  2015   skin cancer   TONSILLECTOMY  age 69       Home Medications    Prior to Admission medications   Medication Sig Start Date End Date Taking? Authorizing Provider  amoxicillin-clavulanate (AUGMENTIN) 875-125 MG tablet Take 1 tablet by mouth every 12 (twelve) hours. 12/26/18  Yes Sharion Balloon, NP  albuterol (PROVENTIL HFA;VENTOLIN HFA) 108 (90 Base) MCG/ACT inhaler INHALE 2 PUFFS BY MOUTH EVERY 6 HOURS AS NEEDED FOR WHEEZING 03/27/18   Ria Bush, MD  aspirin 81 MG EC tablet Take 1 tablet (81 mg total) by mouth daily. 09/17/14   Festus Aloe, MD  atenolol (TENORMIN) 25 MG tablet TAKE 1 TABLET BY MOUTH EVERY DAY 09/23/17   Minna Merritts, MD  atorvastatin (LIPITOR) 20 MG tablet TAKE 1 TABLET BY MOUTH EVERY DAY 04/09/18   Minna Merritts, MD  Blood Glucose Monitoring Suppl (BLOOD GLUCOSE MONITOR SYSTEM) w/Device KIT 1 each by Does not apply route daily. Use as directed to check blood sugar once daily 07/24/17   Ria Bush, MD  diclofenac sodium (VOLTAREN) 1 % GEL APPLY 2 G TOPICALLY 3 (THREE) TIMES DAILY AS NEEDED (ANTI INFLAMMATORY). 07/24/17   Ria Bush, MD  doxycycline (VIBRAMYCIN) 100 MG capsule Take 1 capsule (100 mg total) by mouth 2 (two) times daily. 01/08/19   Darr, Marguerita Beards, PA-C  gabapentin (NEURONTIN) 300 MG capsule TAKE 1 CAPSULE BY MOUTH TWICE A DAY - 1 IN THE MORNING AND 1 AT BEDTIME ALONG WITH 100 MG CAPSULE 01/17/18   Hyatt, Max T, DPM  Glucose Blood (BLOOD GLUCOSE TEST STRIPS) STRP 1 each by In Vitro route daily. Use as directed to check blood sugar once daily 07/24/17   Ria Bush, MD    ibuprofen (ADVIL,MOTRIN) 200 MG tablet Take 400-600 mg by mouth every 6 (six) hours as needed for mild pain (DEPENDS ON PAIN IF TAKES 400-600 MG).     [provider]  Lancets MISC 1 each by Does not apply route daily. Use as directed to check blood sugar daily 07/24/17   Ria Bush, MD  losartan (COZAAR) 50 MG tablet Take 1 tablet (50 mg total) by mouth 2 (two) times daily. 07/21/18   Minna Merritts, MD  metFORMIN (GLUCOPHAGE) 1000 MG tablet TAKE 1 TABLET BY MOUTH TWICE A DAY WITH FOOD 10/14/18   Ria Bush, MD    Family History Family History  Problem Relation  Age of Onset   CAD Mother        MI   Diabetes Mother    Stroke Mother        mini-stroke   Cancer Father 86       lung (smoker)   Heart disease Paternal Grandmother    Heart disease Paternal Grandfather    Diabetes Paternal Grandfather    Cancer Sister        lung   Coronary artery disease Neg Hx        Premature    Social History Social History   Tobacco Use   Smoking status: Former Smoker    Packs/day: 2.00    Years: 35.00    Pack years: 70.00    Types: Cigarettes    Quit date: 01/29/1990    Years since quitting: 28.9   Smokeless tobacco: Never Used  Substance Use Topics   Alcohol use: No   Drug use: No     Allergies   Sulfa antibiotics and Sulfur   Review of Systems Review of Systems  Constitutional: Negative for activity change, chills and fever.  HENT: Positive for congestion, postnasal drip, rhinorrhea, sinus pressure and sinus pain. Negative for ear pain and sore throat.   Eyes: Negative for pain and discharge.  Respiratory: Positive for cough. Negative for shortness of breath and wheezing.   Cardiovascular: Negative for chest pain.  Gastrointestinal: Negative for abdominal pain, diarrhea, nausea and vomiting.  Musculoskeletal: Negative for arthralgias and myalgias.     Physical Exam Triage Vital Signs ED Triage Vitals  Enc Vitals Group     BP 01/08/19  1025 122/81     Pulse Rate 01/08/19 1025 66     Resp 01/08/19 1025 17     Temp 01/08/19 1025 98 F (36.7 C)     Temp Source 01/08/19 1025 Oral     SpO2 01/08/19 1025 96 %     Weight --      Height --      Head Circumference --      Peak Flow --      Pain Score 01/08/19 1024 0     Pain Loc --      Pain Edu? --      Excl. in [place]? --    No data found.  Updated Vital Signs BP 122/81 (BP Location: Left Arm)    Pulse 66    Temp 98 F (36.7 C) (Oral)    Resp 17    SpO2 96%   Visual Acuity Right Eye Distance:   Left Eye Distance:   Bilateral Distance:    Right Eye Near:   Left Eye Near:    Bilateral Near:     Physical Exam Vitals and nursing note reviewed.  Constitutional:      Appearance: He is well-developed.  HENT:     Head: Normocephalic and atraumatic.     Comments: Cerumen bilaterally in canals. TTP over maxillary sinuses.    Right Ear: Tympanic membrane normal.     Left Ear: Tympanic membrane normal.     Nose: Congestion and rhinorrhea present.     Mouth/Throat:     Mouth: Mucous membranes are moist.     Pharynx: Posterior oropharyngeal erythema present. No oropharyngeal exudate.  Eyes:     General: No scleral icterus.    Conjunctiva/sclera: Conjunctivae normal.     Pupils: Pupils are equal, round, and reactive to light.  Cardiovascular:     Rate and Rhythm: Normal rate and regular  rhythm.     Heart sounds: Normal heart sounds. No murmur.  Pulmonary:     Effort: Pulmonary effort is normal. No respiratory distress.     Breath sounds: Normal breath sounds.  Abdominal:     Palpations: Abdomen is soft.     Tenderness: There is no abdominal tenderness.  Musculoskeletal:     Cervical back: Neck supple.  Lymphadenopathy:     Cervical: No cervical adenopathy.  Skin:    General: Skin is warm and dry.  Neurological:     General: No focal deficit present.     Mental Status: He is alert and oriented to person, place, and time.  Psychiatric:        Mood and Affect:  Mood normal.        Behavior: Behavior normal.      UC Treatments / Results  Labs (all labs ordered are listed, but only abnormal results are displayed) Labs Reviewed - No data to display  EKG   Radiology No results found.  Procedures Procedures (including critical care time)  Medications Ordered in UC Medications - No data to display  Initial Impression / Assessment and Plan / UC Course  I have reviewed the triage vital signs and the nursing notes.  Pertinent labs & imaging results that were available during my care of the patient were reviewed by me and considered in my medical decision making (see chart for details).     Acute Maxillary Sinusitis - This fits a treatment failure as opposed to recurrence. His minor improvement and then worsening symptoms support this. As well, he was covid negative and symptom nature has not changed. Based on uptodate algorithm for treatment failure, 2025m augmentin vs doxycycline, made choice for doxy BID x 10days based on better GI tolerance. Instructed to follow up with PCP if no improvement for possible ENT consult or return if acutely worsening.  Final Clinical Impressions(s) / UC Diagnoses   Final diagnoses:  Acute maxillary sinusitis, recurrence not specified     Discharge Instructions     Take medication as prescribed. Continue to utilize Mucinex as directed on packaging. Drink plenty of fluids.  If there is no improvement follow up with Primary Care provider or return here.     ED Prescriptions    Medication Sig Dispense Auth. Provider   doxycycline (VIBRAMYCIN) 100 MG capsule Take 1 capsule (100 mg total) by mouth 2 (two) times daily. 20 capsule Darr, JMarguerita Beards PA-C     PDMP not reviewed this encounter.   DPurnell Shoemaker PA-C 01/08/19 [phone]

## 2019-01-15 ENCOUNTER — Other Ambulatory Visit: Payer: Self-pay

## 2019-01-15 ENCOUNTER — Ambulatory Visit (INDEPENDENT_AMBULATORY_CARE_PROVIDER_SITE_OTHER): Payer: No Typology Code available for payment source | Admitting: Podiatry

## 2019-01-15 ENCOUNTER — Encounter: Payer: Self-pay | Admitting: Podiatry

## 2019-01-15 DIAGNOSIS — M2030 Hallux varus (acquired), unspecified foot: Secondary | ICD-10-CM

## 2019-01-15 DIAGNOSIS — E1142 Type 2 diabetes mellitus with diabetic polyneuropathy: Secondary | ICD-10-CM

## 2019-01-15 DIAGNOSIS — B351 Tinea unguium: Secondary | ICD-10-CM

## 2019-01-15 DIAGNOSIS — M79675 Pain in left toe(s): Secondary | ICD-10-CM

## 2019-01-15 DIAGNOSIS — M2011 Hallux valgus (acquired), right foot: Secondary | ICD-10-CM

## 2019-01-15 DIAGNOSIS — M79674 Pain in right toe(s): Secondary | ICD-10-CM

## 2019-01-15 NOTE — Progress Notes (Signed)
Complaint:  Visit Type: Patient returns to my office for continued preventative foot care services. Complaint: Patient states" my nails have grown long and thick and become painful to walk and wear shoes" Patient has been diagnosed with DM with neuropathy. The patient presents for preventative foot care services. No changes to ROS  Podiatric Exam: Vascular: dorsalis pedis and posterior tibial pulses are palpable bilateral. Capillary return is immediate. Temperature gradient is WNL. Skin turgor WNL  Sensorium: Absent  Semmes Weinstein monofilament test. Diminished  tactile sensation bilaterally. Nail Exam: Pt has thick disfigured discolored nails with subungual debris noted bilateral entire nail hallux through fifth toenails Ulcer Exam: There is no evidence of ulcer or pre-ulcerative changes or infection. Orthopedic Exam: Muscle tone and strength are WNL. No limitations in general ROM. No crepitus or effusions noted. HAV with hallux malleus. Tailors Bunion  B/L Skin: No Porokeratosis. No infection or ulcers  Diagnosis:  Onychomycosis, , Pain in right toe, pain in left toes,  DPN  HAV with hallux malleus.  Tailors bunion  B/L  Treatment & Plan Procedures and Treatment: Consent by patient was obtained for treatment procedures. The patient understood the discussion of treatment and procedures well. All questions were answered thoroughly reviewed. Debridement of mycotic and hypertrophic toenails, 1 through 5 bilateral and clearing of subungual debris. No ulceration, no infection noted. To check on status of shoes. Return Visit-Office Procedure: Patient instructed to return to the office for a follow up visit 3 months for continued evaluation and treatment.    Gardiner Barefoot DPM

## 2019-01-16 ENCOUNTER — Other Ambulatory Visit: Payer: Self-pay | Admitting: Podiatry

## 2019-01-17 ENCOUNTER — Emergency Department
Admission: EM | Admit: 2019-01-17 | Discharge: 2019-01-17 | Disposition: A | Discharge status: Home / Self Care | Payer: No Typology Code available for payment source | Attending: Emergency Medicine | Admitting: Emergency Medicine

## 2019-01-17 ENCOUNTER — Emergency Department: Payer: No Typology Code available for payment source

## 2019-01-17 ENCOUNTER — Encounter: Payer: Self-pay | Admitting: *Deleted

## 2019-01-17 ENCOUNTER — Other Ambulatory Visit: Payer: Self-pay

## 2019-01-17 DIAGNOSIS — Z79899 Other long term (current) drug therapy: Secondary | ICD-10-CM | POA: Diagnosis not present

## 2019-01-17 DIAGNOSIS — E119 Type 2 diabetes mellitus without complications: Secondary | ICD-10-CM | POA: Insufficient documentation

## 2019-01-17 DIAGNOSIS — Z7982 Long term (current) use of aspirin: Secondary | ICD-10-CM | POA: Insufficient documentation

## 2019-01-17 DIAGNOSIS — R39198 Other difficulties with micturition: Secondary | ICD-10-CM | POA: Diagnosis not present

## 2019-01-17 DIAGNOSIS — Z7984 Long term (current) use of oral hypoglycemic drugs: Secondary | ICD-10-CM | POA: Diagnosis not present

## 2019-01-17 DIAGNOSIS — Z87891 Personal history of nicotine dependence: Secondary | ICD-10-CM | POA: Insufficient documentation

## 2019-01-17 DIAGNOSIS — G8918 Other acute postprocedural pain: Secondary | ICD-10-CM

## 2019-01-17 DIAGNOSIS — N99840 Postprocedural hematoma of a genitourinary system organ or structure following a genitourinary system procedure: Secondary | ICD-10-CM | POA: Diagnosis not present

## 2019-01-17 DIAGNOSIS — I2510 Atherosclerotic heart disease of native coronary artery without angina pectoris: Secondary | ICD-10-CM | POA: Diagnosis not present

## 2019-01-17 DIAGNOSIS — S3022XA Contusion of scrotum and testes, initial encounter: Secondary | ICD-10-CM

## 2019-01-17 DIAGNOSIS — I10 Essential (primary) hypertension: Secondary | ICD-10-CM | POA: Diagnosis not present

## 2019-01-17 DIAGNOSIS — L7622 Postprocedural hemorrhage and hematoma of skin and subcutaneous tissue following other procedure: Secondary | ICD-10-CM | POA: Diagnosis present

## 2019-01-17 HISTORY — PX: GROIN MASS OPEN BIOPSY: SHX1714

## 2019-01-17 LAB — CBC WITH DIFFERENTIAL/PLATELET
Abs Immature Granulocytes: 0.19 10*3/uL — ABNORMAL HIGH (ref 0.00–0.07)
Basophils Absolute: 0.1 10*3/uL (ref 0.0–0.1)
Basophils Relative: 0 %
Eosinophils Absolute: 0 10*3/uL (ref 0.0–0.5)
Eosinophils Relative: 0 %
HCT: 35.8 % — ABNORMAL LOW (ref 39.0–52.0)
Hemoglobin: 12.8 g/dL — ABNORMAL LOW (ref 13.0–17.0)
Immature Granulocytes: 1 %
Lymphocytes Relative: 7 %
Lymphs Abs: 1.4 10*3/uL (ref 0.7–4.0)
MCH: 33.3 pg (ref 26.0–34.0)
MCHC: 35.8 g/dL (ref 30.0–36.0)
MCV: 93.2 fL (ref 80.0–100.0)
Monocytes Absolute: 1 10*3/uL (ref 0.1–1.0)
Monocytes Relative: 5 %
Neutro Abs: 17.8 10*3/uL — ABNORMAL HIGH (ref 1.7–7.7)
Neutrophils Relative %: 87 %
Platelets: 356 10*3/uL (ref 150–400)
RBC: 3.84 MIL/uL — ABNORMAL LOW (ref 4.22–5.81)
RDW: 11.9 % (ref 11.5–15.5)
WBC: 20.5 10*3/uL — ABNORMAL HIGH (ref 4.0–10.5)
nRBC: 0 % (ref 0.0–0.2)

## 2019-01-17 LAB — BASIC METABOLIC PANEL
Anion gap: 6 (ref 5–15)
BUN: 21 mg/dL (ref 8–23)
CO2: 29 mmol/L (ref 22–32)
Calcium: 8.6 mg/dL — ABNORMAL LOW (ref 8.9–10.3)
Chloride: 100 mmol/L (ref 98–111)
Creatinine, Ser: 0.87 mg/dL (ref 0.61–1.24)
GFR calc Af Amer: >60 mL/min (ref >60)
GFR calc non Af Amer: >60 mL/min (ref >60)
Glucose, Bld: 251 mg/dL — ABNORMAL HIGH (ref 70–99)
Potassium: 4.5 mmol/L (ref 3.5–5.1)
Sodium: 135 mmol/L (ref 135–145)

## 2019-01-17 LAB — URINALYSIS, ROUTINE W REFLEX MICROSCOPIC
Bacteria, UA: NONE SEEN
Bilirubin Urine: NEGATIVE
Glucose, UA: NEGATIVE mg/dL
Ketones, ur: NEGATIVE mg/dL
Leukocytes,Ua: NEGATIVE
Nitrite: NEGATIVE
Protein, ur: NEGATIVE mg/dL
Specific Gravity, Urine: >1.046 — ABNORMAL HIGH (ref 1.005–1.030)
pH: 5 (ref 5.0–8.0)

## 2019-01-17 MED ORDER — SODIUM CHLORIDE 0.9 % IV SOLN
1.0000 g | Freq: Once | INTRAVENOUS | Status: AC
  Administered 2019-01-17: 1 g via INTRAVENOUS
  Filled 2019-01-17: qty 1

## 2019-01-17 MED ORDER — IOHEXOL 300 MG/ML  SOLN
100.0000 mL | Freq: Once | INTRAMUSCULAR | Status: AC | PRN
  Administered 2019-01-17: 100 mL via INTRAVENOUS

## 2019-01-17 MED ORDER — LACTATED RINGERS IV BOLUS
1000.0000 mL | Freq: Once | INTRAVENOUS | Status: AC
  Administered 2019-01-17: 1000 mL via INTRAVENOUS

## 2019-01-17 MED ORDER — MORPHINE SULFATE (PF) 4 MG/ML IV SOLN
4.0000 mg | Freq: Once | INTRAVENOUS | Status: AC
  Administered 2019-01-17: 4 mg via INTRAVENOUS
  Filled 2019-01-17: qty 1

## 2019-01-17 NOTE — ED Provider Notes (Signed)
[facility] Emergency Department Provider Note   ____________________________________________   First MD Initiated Contact with Patient 01/17/19 0304     (approximate)  I have reviewed the triage vital signs and the nursing notes.   HISTORY  Chief Complaint Post-op Problem    HPI Casey Golden is a 68 y.o. male with past medical history of CAD, hypertension, hyperlipidemia, diabetes, and extramammary Paget's disease presents to the ED for postop problem.  Patient reports that earlier today he had excision of large wound from his groin performed by dermatology at [facility].  He states that the wound was due to his history of extramammary Paget's disease and he has dealt with similar in the past.  On the car ride home from [facility], he started noticing significant bleeding from his groin.  He states that blood "filled up the seat" and trailed behind him when he returned home.  He attempted to apply pressure to the area with no improvement.  He eventually decided to call EMS when he began feeling lightheaded.  He is not sure if he is continuing to bleed.  He also states that it has been difficult for him to urinate with significant swelling around his groin.  He was able to urinate shortly after the procedure, but states he has not been able to do so since then.  He does not currently take any blood thinners beyond 1 daily baby aspirin.        Past Medical History:  Diagnosis Date  . Arthritis   . CAD (coronary artery disease)    cathx3 with nonobstructive disease. Last cath with RCA 40% stenosis in 2009  . Cataract 2019   bilateral; resolved with surgery  . Colonic polyp   . Fatty liver disease, nonalcoholic 6606   by [place]  . GERD (gastroesophageal reflux disease)   . History of chicken pox   . Hyperlipidemia   . Hypertension   . Nocturia   . OSA (obstructive sleep apnea)    CPAP, compliant  . Past use of tobacco    Quit 1990, 70 pack year history  . Rash of  genital area    09-08-2014  per pt Dr Junious Silk aware  . Seasonal and perennial allergic rhinitis   . Type 2 diabetes mellitus ([place])   . Wears dentures    full upper/  partial lower  . Wears glasses     Patient Active Problem List   Diagnosis Date Noted  . Pulmonary nodule less than 6 cm determined by computed tomography of lung 11/06/2018  . Atherosclerosis of aorta ([place]) 11/05/2018  . Extramammary Paget's disease 10/24/2018  . Pain due to onychomycosis of toenails of both feet 07/31/2018  . Laceration of left little finger without foreign body without damage to nail 06/17/2018  . Osteoarthritis of knee 05/23/2017  . Cerumen impaction 05/20/2017  . Ex-smoker 03/27/2017  . Elevated PSA 03/27/2017  . Personal history of colonic polyps   . Stress due to family tension 05/15/2016  . Skin rash 05/15/2016  . Hx of colonic polyps   . Benign neoplasm of transverse colon   . Polyp of sigmoid colon   . Benign neoplasm of descending colon   . Chronic midline low back pain 02/27/2016  . Bilateral hip pain 11/02/2015  . Right knee pain 07/12/2015  . Health maintenance examination 03/07/2015  . Severe obesity (BMI 35.0-39.9) with comorbidity ([place]) 03/07/2015  . Advanced care planning/counseling discussion 03/07/2015  . Controlled diabetes mellitus type 2  with complications ([place]) 41/32/4401  . Dyslipidemia associated with type 2 diabetes mellitus ([place]) 05/06/2014  . OSA (obstructive sleep apnea) 05/06/2014  . Fatty liver disease, nonalcoholic 02/72/5366  . H/O malignant neoplasm of skin 01/01/2013  . HTN (hypertension) 02/15/2009  . CAD (coronary artery disease), native coronary artery 02/15/2009  . Shortness of breath 02/15/2009    Past Surgical History:  Procedure Laterality Date  . ABDOMINAL HERNIA REPAIR  2007      [facility]   open repair  . APPENDECTOMY  age 41  . CARDIAC CATHETERIZATION  12-23-2007   [facility]   Abnormal myoview w/ ischemia/  40% mRCA with nonobstructive and no sig.  plaque in his left system, EF 55%  . CARDIAC CATHETERIZATION  Apr 2008    [facility]   Abnormal myoview/  50% RCA,  ef 65%  . CARDIAC CATHETERIZATION  1999      [facility]  . CATARACT EXTRACTION, BILATERAL Bilateral 11/2017  . CERVICAL FUSION  1992  . CHOLECYSTECTOMY OPEN  2006  . COLONOSCOPY WITH PROPOFOL N/A 04/17/2016   TAs, high grade dysplasia with margins clear, diverticulosis Lucilla Lame, MD)  . COLONOSCOPY WITH PROPOFOL N/A 08/07/2016   TAx1, diverticulosis, rpt 3 yrs ([place])  . DENTAL SURGERY     metal dental implant L mandible  . EXCISION OF SKIN TAG Right 09/14/2014   Procedure: EXCISION OF SKIN TAG;  Surgeon: Festus Aloe, MD;  Location: [facility];  Service: Urology;  Laterality: Right;  . HYDROCELE EXCISION Left 09/14/2014   Procedure: LEFT HYDROCELECTOMY ADULT;  Surgeon: Festus Aloe, MD;  Location: [facility];  Service: Urology;  Laterality: Left;  . MOHS SURGERY  2015   skin cancer  . TONSILLECTOMY  age 83    Prior to Admission medications   Medication Sig Start Date End Date Taking? Authorizing Provider  albuterol (PROVENTIL HFA;VENTOLIN HFA) 108 (90 Base) MCG/ACT inhaler INHALE 2 PUFFS BY MOUTH EVERY 6 HOURS AS NEEDED FOR WHEEZING 03/27/18  Yes Ria Bush, MD  aspirin 81 MG EC tablet Take 1 tablet (81 mg total) by mouth daily. 09/17/14  Yes Festus Aloe, MD  atenolol (TENORMIN) 25 MG tablet TAKE 1 TABLET BY MOUTH EVERY DAY 09/23/17  Yes Gollan, Kathlene November, MD  atorvastatin (LIPITOR) 20 MG tablet TAKE 1 TABLET BY MOUTH EVERY DAY 04/09/18  Yes Gollan, Kathlene November, MD  Blood Glucose Monitoring Suppl (BLOOD GLUCOSE MONITOR SYSTEM) w/Device KIT 1 each by Does not apply route daily. Use as directed to check blood sugar once daily 07/24/17  Yes Ria Bush, MD  diclofenac sodium (VOLTAREN) 1 % GEL APPLY 2 G TOPICALLY 3 (THREE) TIMES DAILY AS NEEDED (ANTI INFLAMMATORY). 07/24/17  Yes Ria Bush, MD  doxycycline (VIBRAMYCIN) 100 MG  capsule Take 1 capsule (100 mg total) by mouth 2 (two) times daily. 01/08/19  Yes Darr, Marguerita Beards, PA-C  gabapentin (NEURONTIN) 300 MG capsule TAKE 1 CAPSULE BY MOUTH TWICE A DAY - 1 IN THE MORNING AND 1 AT BEDTIME ALONG WITH 100 MG CAPSULE 01/16/19  Yes Hyatt, Max T, DPM  Glucose Blood (BLOOD GLUCOSE TEST STRIPS) STRP 1 each by In Vitro route daily. Use as directed to check blood sugar once daily 07/24/17  Yes Ria Bush, MD  Lancets MISC 1 each by Does not apply route daily. Use as directed to check blood sugar daily 07/24/17  Yes Ria Bush, MD  losartan (COZAAR) 50 MG tablet Take 1 tablet (50 mg total) by mouth 2 (two) times daily. 07/21/18  Yes Minna Merritts, MD  metFORMIN (GLUCOPHAGE) 1000 MG tablet TAKE 1 TABLET BY MOUTH TWICE A DAY WITH FOOD 10/14/18  Yes Ria Bush, MD  ibuprofen (ADVIL,MOTRIN) 200 MG tablet Take 400-600 mg by mouth every 6 (six) hours as needed for mild pain (DEPENDS ON PAIN IF TAKES 400-600 MG).     [provider]    Allergies Sulfa antibiotics and Sulfur  Family History  Problem Relation Age of Onset  . CAD Mother        MI  . Diabetes Mother   . Stroke Mother        mini-stroke  . Cancer Father 20       lung (smoker)  . Heart disease Paternal Grandmother   . Heart disease Paternal Grandfather   . Diabetes Paternal Grandfather   . Cancer Sister        lung  . Coronary artery disease Neg Hx        Premature    Social History Social History   Tobacco Use  . Smoking status: Former Smoker    Packs/day: 2.00    Years: 35.00    Pack years: 70.00    Types: Cigarettes    Quit date: 01/29/1990    Years since quitting: 28.9  . Smokeless tobacco: Never Used  Substance Use Topics  . Alcohol use: No  . Drug use: No    Review of Systems  Constitutional: No fever/chills Eyes: No visual changes. ENT: No sore throat. Cardiovascular: Denies chest pain. Respiratory: Denies shortness of breath. Gastrointestinal: No abdominal  pain.  No nausea, no vomiting.  No diarrhea.  No constipation. Genitourinary: Negative for dysuria.  Positive for difficulty urinating and bleeding from groin surgical site. Musculoskeletal: Negative for back pain. Skin: Negative for rash. Neurological: Negative for headaches, focal weakness or numbness.  ____________________________________________   PHYSICAL EXAM:  VITAL SIGNS: ED Triage Vitals  Enc Vitals Group     BP 01/17/19 0124 130/60     Pulse Rate 01/17/19 0124 60     Resp 01/17/19 0124 20     Temp 01/17/19 0124 97.9 F (36.6 C)     Temp Source 01/17/19 0124 Oral     SpO2 01/17/19 0124 97 %     Weight 01/17/19 0124 240 lb (108.9 kg)     Height 01/17/19 0124 5' 8"  (1.727 m)     Head Circumference --      Peak Flow --      Pain Score 01/17/19 0137 4     Pain Loc --      Pain Edu? --      Excl. in [place]? --     Constitutional: Alert and oriented. Eyes: Conjunctivae are normal. Head: Atraumatic. Nose: No congestion/rhinnorhea. Mouth/Throat: Mucous membranes are moist. Neck: Normal ROM Cardiovascular: Normal rate, regular rhythm. Grossly normal heart sounds. Respiratory: Normal respiratory effort.  No retractions. Lungs CTAB. Gastrointestinal: Soft and nontender. No distention. Genitourinary: Large surgical site to left groin and extending into left scrotum with overlying dried blood and clot but no active bleeding.  Significant hematoma noted to left groin, suprapubic area, and scrotum with edematous foreskin and penis. Musculoskeletal: No lower extremity tenderness nor edema. Neurologic:  Normal speech and language. No gross focal neurologic deficits are appreciated. Skin:  Skin is warm, dry and intact. No rash noted. Psychiatric: Mood and affect are normal. Speech and behavior are normal.  ____________________________________________   LABS (all labs ordered are listed, but only abnormal results are displayed)  Labs Reviewed  CBC WITH DIFFERENTIAL/PLATELET -  Abnormal; Notable for the following components:      Result Value   WBC 20.5 (*)    RBC 3.84 (*)    Hemoglobin 12.8 (*)    HCT 35.8 (*)    Neutro Abs 17.8 (*)    Abs Immature Granulocytes 0.19 (*)    All other components within normal limits  BASIC METABOLIC PANEL - Abnormal; Notable for the following components:   Glucose, Bld 251 (*)    Calcium 8.6 (*)    All other components within normal limits  URINALYSIS, ROUTINE W REFLEX MICROSCOPIC - Abnormal; Notable for the following components:   Color, Urine YELLOW (*)    APPearance HAZY (*)    Specific Gravity, Urine >1.046 (*)    Hgb urine dipstick SMALL (*)    All other components within normal limits   ED ECG REPORT I, Blake Divine, the attending physician, personally viewed and interpreted this ECG.   Date: 01/17/2019  EKG Time: 2:05  Rate: 60  Rhythm: normal sinus rhythm  Axis: Normal  Intervals:none  ST&T Change: None   PROCEDURES  Procedure(s) performed (including Critical Care):  Procedures   ____________________________________________   INITIAL IMPRESSION / ASSESSMENT AND PLAN / ED COURSE       68 year old male status post MOHS procedure and large wound excision to left groin yesterday presents with significant bleeding from surgical site as well as swelling to the surrounding area.  He has what appears to be a large hematoma in his left groin extending into his scrotum with his dressing soaked through with blood, but no active bleeding seen.  I would be concerned for vascular injury during his surgery and we will further assess with CT.  He is also noted to have a drop in his hemoglobin from baseline, but hemoglobin remains greater than 12 and no indication for transfusion.  Remainder of labs significant for hyperglycemia with no evidence of DKA, will hydrate with IV fluids.  He also complains of difficulty urinating, 300-400 cc's urine noted on bladder scan.  We will have the patient stand up and attempt to  urinate, but if he is unable to do so he may require placement of Foley catheter.  Once standing up, patient was able to urinate without difficulty and post void residual showed 90 cc of urine in bladder, no indication for Foley catheter at this time.  CT scan shows large hematoma extending from his left inguinal area into his scrotum, however there there is no evidence of active extravasation.  He has not had any recurrent bleeding from his surgical site and pain is much improved.  Case discussed with Dr. Bella Kennedy of dermatology at [facility], who ideally would like to have patient evaluated in the dermatology clinic if patient deemed otherwise stable for discharge.  Case discussed with Dr. Jeffie Pollock of urology, who agrees that since patient is hemodynamically stable with no active extravasation on his CT scan, he would be appropriate for discharge to dermatology clinic.  Dr. Bella Kennedy will be available to see the patient as soon as he leaves the ED.  I have counseled the patient to return to the ED for any significant recurrent bleeding, patient agrees with plan.      ____________________________________________   FINAL CLINICAL IMPRESSION(S) / ED DIAGNOSES  Final diagnoses:  Scrotal hematoma  Postoperative pain     ED Discharge Orders    None       Note:  This document was prepared  using Systems analyst and may include unintentional dictation errors.   Blake Divine, MD 01/17/19 [phone]

## 2019-01-17 NOTE — Discharge Instructions (Signed)
Your CT scan today showed a large collection of blood in the area of your surgical procedure, but there was no evidence of active bleeding.  Please coordinate follow-up with dermatology at [facility] as soon as you leave the ER.  If you have any recurrent bleeding, please immediately return to the ER.

## 2019-01-17 NOTE — ED Triage Notes (Signed)
Pt had surgery on his testicles yesterday and presents w/ tearing sensation and bleeding through his post-op drsg, increased pain and swelling. Pt did call surgeon and was told to go to the ER. Pt urinated x 1 since 2000 last night.

## 2019-01-17 NOTE — ED Notes (Signed)
Pt ambulated to commode and was able to pass 300 mL of urine. Post void bladder scan indicates residual of 91 mL

## 2019-01-17 NOTE — ED Notes (Signed)
E pad not working. Pt educated on discharge instructions and verbalized understanding. Pt departed for [facility] dermatology clinic with family.

## 2019-01-20 ENCOUNTER — Other Ambulatory Visit: Payer: Self-pay | Admitting: Cardiovascular Disease

## 2019-01-27 ENCOUNTER — Other Ambulatory Visit: Payer: Self-pay | Admitting: Cardiovascular Disease

## 2019-02-04 ENCOUNTER — Telehealth: Payer: Self-pay | Admitting: Cardiovascular Disease

## 2019-02-04 NOTE — Telephone Encounter (Signed)
Patient asks if he still needs a cath. Please call to discuss

## 2019-02-04 NOTE — Telephone Encounter (Signed)
Pt seen in ED 07/23/18 with atypical chest pain, myoview ordered outpatient by Elenor Quinones, PA. Pt has not scheduled d/t cancer treatment. Recent CT done by PCP shows mild arthrosclerosis. Pt wanting to further discuss POC.   He denies any chest pain or SOB, he is just wanting to know if this should be followed up on since is it incomplete at this time.   Appt made for next week with NP. Pt verbalized understanding and is agreeable to POC>   No further questions at this time.

## 2019-02-05 ENCOUNTER — Other Ambulatory Visit: Payer: Self-pay

## 2019-02-05 ENCOUNTER — Encounter: Payer: Self-pay | Admitting: Family Medicine

## 2019-02-05 ENCOUNTER — Ambulatory Visit (INDEPENDENT_AMBULATORY_CARE_PROVIDER_SITE_OTHER): Payer: No Typology Code available for payment source | Admitting: Family Medicine

## 2019-02-05 VITALS — BP 120/60 | HR 65 | Temp 97.9°F | Ht 68.5 in | Wt 240.4 lb

## 2019-02-05 DIAGNOSIS — R911 Solitary pulmonary nodule: Secondary | ICD-10-CM

## 2019-02-05 DIAGNOSIS — IMO0001 Reserved for inherently not codable concepts without codable children: Secondary | ICD-10-CM

## 2019-02-05 DIAGNOSIS — J432 Centrilobular emphysema: Secondary | ICD-10-CM | POA: Insufficient documentation

## 2019-02-05 DIAGNOSIS — E118 Type 2 diabetes mellitus with unspecified complications: Secondary | ICD-10-CM

## 2019-02-05 DIAGNOSIS — C4499 Other specified malignant neoplasm of skin, unspecified: Secondary | ICD-10-CM | POA: Diagnosis not present

## 2019-02-05 DIAGNOSIS — Z87891 Personal history of nicotine dependence: Secondary | ICD-10-CM

## 2019-02-05 LAB — POCT GLYCOSYLATED HEMOGLOBIN (HGB A1C): Hemoglobin A1C: 6.3 % — AB (ref 4.0–5.6)

## 2019-02-05 NOTE — Patient Instructions (Addendum)
Ask to have company fax [place] diabetic shoe request again.  Schedule eye doctor appointment as you're due.  Because of family history of lung cancer, we should get another CT lungs in October 2021.  Good to see you today. Return as needed or in 4 months for physical.

## 2019-02-05 NOTE — Assessment & Plan Note (Signed)
Quit [phone]

## 2019-02-05 NOTE — Assessment & Plan Note (Signed)
Update A1c - great control. Foot exam through podiatry. Advised to have DME company send [place] diabetic shoe request and I will fill out.

## 2019-02-05 NOTE — Progress Notes (Signed)
This visit was conducted in person.  BP 120/60 (BP Location: Left Arm, Patient Position: Sitting, Cuff Size: Normal)   Pulse 65   Temp 97.9 F (36.6 C) (Temporal)   Ht 5' 8.5" (1.74 m)   Wt 240 lb 6 oz (109 kg)   SpO2 98%   BMI 36.02 kg/m    CC: 8mof/u visit, discuss recent CT lungs Subjective:    Patient ID: Casey Golden male    DOB: 411-May-1952 69y.o.   MRN: [id number] HPI: Casey Golden a 69y.o. male presenting on 02/05/2019 for Results (Wants to discuss spot found on lung on CT. ) and Medication Refill (Needs rx for diabetic shoes. )   Recent surgical excision 12/2018 for L perineal extramammary paget's disease.   Recent pan CT with incidental findings of mild centrilobular emphysema as well as 38mnodule LLL. Also incidental finding of 1460m adrenal adenoma, bilateral renal cysts largest 1.9cm.   Quit smoking early 90s.   Risk factors for lung cancer: Smoking - ex smoker quit 1992  Second hand smoke - none currently  Radon exposure - none known  Asbestos exposure - none known  Arsenic, diesel exhaust, silica, chromium exposure - none known  Family or personal history of lung cancer - father and sister (passed away age 42y49yoadiation therapy to chest - none  Beta carotene supplements - no   Requests diabetic shoes. Gets this through podiatry Dr Casey Golden saw podiatry 12/2018.   DM - does not regularly check sugars. Last checked 1 month ago, doesn't remember reading. Compliant with antihyperglycemic regimen which includes: metformin 1000m65md. Denies hypoglycemic symptoms. Denies paresthesias. Last diabetic eye exam DUE. Pneumovax: 2020. Prevnar: 2017. Glucometer brand: generic sugar meter. [facility] Lab Results  Component Value Date   HGBA1C 6.3 (A) 02/05/2019   Diabetic Foot Exam - Simple   No data filed     Lab Results  Component Value Date   MICROALBUR <0.7 10/25/2016        Relevant past medical, surgical, family  and social history reviewed and updated as indicated. Interim medical history since our last visit reviewed. Allergies and medications reviewed and updated. Outpatient Medications Prior to Visit  Medication Sig Dispense Refill  . albuterol (PROVENTIL HFA;VENTOLIN HFA) 108 (90 Base) MCG/ACT inhaler INHALE 2 PUFFS BY MOUTH EVERY 6 HOURS AS NEEDED FOR WHEEZING 8.5 Inhaler 2  . aspirin 81 MG EC tablet Take 1 tablet (81 mg total) by mouth daily. 30 tablet 12  . atenolol (TENORMIN) 25 MG tablet TAKE 1 TABLET BY MOUTH EVERY DAY 90 tablet 3  . atorvastatin (LIPITOR) 20 MG tablet TAKE 1 TABLET BY MOUTH EVERY DAY 90 tablet 3  . Blood Glucose Monitoring Suppl (BLOOD GLUCOSE MONITOR SYSTEM) w/Device KIT 1 each by Does not apply route daily. Use as directed to check blood sugar once daily 1 each 0  . diclofenac sodium (VOLTAREN) 1 % GEL APPLY 2 G TOPICALLY 3 (THREE) TIMES DAILY AS NEEDED (ANTI INFLAMMATORY). 100 g 1  . doxycycline (VIBRAMYCIN) 100 MG capsule Take 1 capsule (100 mg total) by mouth 2 (two) times daily. 20 capsule 0  . gabapentin (NEURONTIN) 300 MG capsule TAKE 1 CAPSULE BY MOUTH TWICE A DAY - 1 IN THE MORNING AND 1 AT BEDTIME ALONG WITH 100 MG CAPSULE 180 capsule 3  . Glucose Blood (BLOOD GLUCOSE TEST STRIPS) STRP 1 each by In Vitro route daily. Use as directed to check  blood sugar once daily 100 each 0  . ibuprofen (ADVIL,MOTRIN) 200 MG tablet Take 400-600 mg by mouth every 6 (six) hours as needed for mild pain (DEPENDS ON PAIN IF TAKES 400-600 MG).     . Lancets MISC 1 each by Does not apply route daily. Use as directed to check blood sugar daily 100 each 0  . losartan (COZAAR) 50 MG tablet Take 1 tablet (50 mg total) by mouth 2 (two) times daily. Please call to schedule office visit for further refills. Thank you! 180 tablet 0  . metFORMIN (GLUCOPHAGE) 1000 MG tablet TAKE 1 TABLET BY MOUTH TWICE A DAY WITH FOOD 180 tablet 2   No facility-administered medications prior to visit.     Per HPI  unless specifically indicated in ROS section below Review of Systems Objective:    BP 120/60 (BP Location: Left Arm, Patient Position: Sitting, Cuff Size: Normal)   Pulse 65   Temp 97.9 F (36.6 C) (Temporal)   Ht 5' 8.5" (1.74 m)   Wt 240 lb 6 oz (109 kg)   SpO2 98%   BMI 36.02 kg/m   Wt Readings from Last 3 Encounters:  02/05/19 240 lb 6 oz (109 kg)  01/17/19 240 lb (108.9 kg)  12/22/18 244 lb (110.7 kg)    Physical Exam Vitals and nursing note reviewed.  Constitutional:      Appearance: Normal appearance. He is obese. He is not ill-appearing.  Cardiovascular:     Rate and Rhythm: Normal rate and regular rhythm.     Pulses: Normal pulses.     Heart sounds: Normal heart sounds. No murmur.  Pulmonary:     Effort: Pulmonary effort is normal. No respiratory distress.     Breath sounds: Normal breath sounds. No wheezing, rhonchi or rales.  Neurological:     Mental Status: He is alert.  Psychiatric:        Mood and Affect: Mood normal.        Behavior: Behavior normal.       Results for orders placed or performed in visit on 02/05/19  POCT glycosylated hemoglobin (Hb A1C)  Result Value Ref Range   Hemoglobin A1C 6.3 (A) 4.0 - 5.6 %   HbA1c POC (<> result, manual entry)     HbA1c, POC (prediabetic range)     HbA1c, POC (controlled diabetic range)     Assessment & Plan:  This visit occurred during the SARS-CoV-2 public health emergency.  Safety protocols were in place, including screening questions prior to the visit, additional usage of staff PPE, and extensive cleaning of exam room while observing appropriate contact time as indicated for disinfecting solutions.   Problem List Items Addressed This Visit    Pulmonary nodule less than 6 cm determined by computed tomography of lung    Reviewed small pulm nodule noted at CT. Ex smoker almost 30 yrs ago. fmhx lung cancer in father and sister. Will update CT lungs in 1 year.       Extramammary Paget's disease - Primary     He did have this excised 12/2018.  Fortunately reassuring CT results reviewed with patient.       Ex-smoker    Quit 1992      Controlled diabetes mellitus type 2 with complications ([place])    Update A1c - great control. Foot exam through podiatry. Advised to have DME company send [place] diabetic shoe request and I will fill out.       Relevant Orders   POCT  glycosylated hemoglobin (Hb A1C) (Completed)   Centrilobular emphysema (HCC)    Mild by CT. Ex smoker. Asxs. Will continue to monitor off respiratory medication.           No orders of the defined types were placed in this encounter.  Orders Placed This Encounter  Procedures  . POCT glycosylated hemoglobin (Hb A1C)    Follow up plan: Return for annual exam, prior fasting for blood work, medicare wellness visit.  Ria Bush, MD

## 2019-02-05 NOTE — Assessment & Plan Note (Addendum)
He did have this excised 12/2018.  Fortunately reassuring CT results reviewed with patient.

## 2019-02-05 NOTE — Assessment & Plan Note (Signed)
Reviewed small pulm nodule noted at CT. Ex smoker almost 30 yrs ago. fmhx lung cancer in father and sister. Will update CT lungs in 1 year.

## 2019-02-05 NOTE — Assessment & Plan Note (Signed)
Mild by CT. Ex smoker. Asxs. Will continue to monitor off respiratory medication.

## 2019-02-11 ENCOUNTER — Other Ambulatory Visit: Payer: Self-pay

## 2019-02-11 ENCOUNTER — Ambulatory Visit (INDEPENDENT_AMBULATORY_CARE_PROVIDER_SITE_OTHER): Payer: No Typology Code available for payment source | Admitting: Family

## 2019-02-11 ENCOUNTER — Encounter: Payer: Self-pay | Admitting: Family

## 2019-02-11 VITALS — BP 124/66 | HR 66 | Ht 68.0 in | Wt 241.5 lb

## 2019-02-11 DIAGNOSIS — I2510 Atherosclerotic heart disease of native coronary artery without angina pectoris: Secondary | ICD-10-CM | POA: Diagnosis not present

## 2019-02-11 DIAGNOSIS — E782 Mixed hyperlipidemia: Secondary | ICD-10-CM

## 2019-02-11 DIAGNOSIS — I10 Essential (primary) hypertension: Secondary | ICD-10-CM | POA: Diagnosis not present

## 2019-02-11 MED ORDER — NITROGLYCERIN 0.4 MG SL SUBL: 0.4000 mg | SUBLINGUAL_TABLET | SUBLINGUAL | 3 refills | Status: DC | PRN

## 2019-02-11 NOTE — Progress Notes (Signed)
Office Visit    Patient Name: Casey Golden Date of Encounter: 02/11/2019  Primary Care Provider:  Ria Bush, MD Primary Cardiologist:  Ida Rogue, MD Electrophysiologist:  None   Chief Complaint    Casey Golden is a 69 y.o. male with a hx of HLD, HTN, nonobstructive CAD with cardiac catheterization x3, hx of syncope, GERD, nonalcoholic fatty liver (0141), OSA, OA/back pain presents today for follow up of CAD   Past Medical History    Past Medical History:  Diagnosis Date  . Arthritis   . CAD (coronary artery disease)    cathx3 with nonobstructive disease. Last cath with RCA 40% stenosis in 2009  . Cancer ([place])   . Cataract 2019   bilateral; resolved with surgery  . Colonic polyp   . Fatty liver disease, nonalcoholic 0301   by [place]  . GERD (gastroesophageal reflux disease)   . History of chicken pox   . Hyperlipidemia   . Hypertension   . Nocturia   . OSA (obstructive sleep apnea)    CPAP, compliant  . Paget's disease of bony pelvis   . Past use of tobacco    Quit 1990, 70 pack year history  . Rash of genital area    09-08-2014  per pt Dr Junious Silk aware  . Seasonal and perennial allergic rhinitis   . Type 2 diabetes mellitus ([place])   . Wears dentures    full upper/  partial lower  . Wears glasses    Past Surgical History:  Procedure Laterality Date  . ABDOMINAL HERNIA REPAIR  2007      [facility]   open repair  . APPENDECTOMY  age 80  . CARDIAC CATHETERIZATION  12-23-2007   [facility]   Abnormal myoview w/ ischemia/  40% mRCA with nonobstructive and no sig. plaque in his left system, EF 55%  . CARDIAC CATHETERIZATION  Apr 2008    [facility]   Abnormal myoview/  50% RCA,  ef 65%  . CARDIAC CATHETERIZATION  1999      [facility]  . CATARACT EXTRACTION, BILATERAL Bilateral 11/2017  . CERVICAL FUSION  1992  . CHOLECYSTECTOMY OPEN  2006  . COLONOSCOPY WITH PROPOFOL N/A 04/17/2016   TAs, high grade dysplasia with margins clear, diverticulosis Lucilla Lame, MD)  .  COLONOSCOPY WITH PROPOFOL N/A 08/07/2016   TAx1, diverticulosis, rpt 3 yrs ([facility])  . DENTAL SURGERY     metal dental implant L mandible  . EXCISION OF SKIN TAG Right 09/14/2014   Procedure: EXCISION OF SKIN TAG;  Surgeon: Festus Aloe, MD;  Location: [facility];  Service: Urology;  Laterality: Right;  . GROIN MASS OPEN BIOPSY Left 01/17/2019  . HYDROCELE EXCISION Left 09/14/2014   Procedure: LEFT HYDROCELECTOMY ADULT;  Surgeon: Festus Aloe, MD;  Location[facility];  Service: Urology;  Laterality: Left;  . MOHS SURGERY  2015   skin cancer  . TONSILLECTOMY  age 42    Allergies  Allergies  Allergen Reactions  . Sulfa Antibiotics Hives  . Sulfur     History of Present Illness    Casey Golden is a 68 y.o. male with a hx of HLD, HTN, nonobstructive CAD with cardiac catheterization x3, hx of syncope, GERD, nonalcoholic fatty liver (3143), OSA, OA/back pain. Family hx notable for CAD in mother, heart disease paternal grandfather and paternal grandmother, CVA in mother, Last seen 07/23/18 by Elenor Quinones, PA.  Per previous documentation last cardiac catheterization 05/13/2006 with normal LVEF, 50%  mid RCA disease and 30% LAD disease.  Catheterization 11/2007 with 40% mid RCA disease, normal LVEF.  He was seen in the office 07/23/18 and subsequently sent to the ED with atypical chest pain. Outpatient Myoview was ordered by Elenor Quinones, PA. Patient did not schedule due to cancer treatment. He had a CT chest 11/04/2018 with only mild atherosclerosis noted to coronaries.  Multiple urgent care/ED visit since that time. 12/26/18 urgent care for sinusitis. 01/08/19 for sinusitis.  Diagnosed with cancer.  01/17/19 ED visit scrotal hematoma post Mohs surgery for EMPD with [facility] dermatology. It was then evacuated at the [place] [facility] on 01/17/19.  He tells me [facility] dermatology does not feel that he needs additional follow-up with oncology as all the cancer tissue was  removed.  Tells me his chest pain has been better.  It has not recurred like it did back in June. No chest pain the last 3 days.  Tells me he sometimes will get a "constant "chest pain that is not noted to be exacerbated or relieved by anything and is tolerable.  Happens "sometimes "and details are rather nonspecific.  Tells me he thinks it is related to his lungs in the benign pulmonary nodule that was found.  His prior anginal symptoms were neck pain that went down arm and into chest.  Tells me this pain was very severe.  Tells me he has never had recurrent pain like this.  Denies shortness of breath, DOE.  Denies LE edema, orthopnea, PND.  Reports no lightheadedness, dizziness.  EKGs/Labs/Other Studies Reviewed:   The following studies were reviewed today:  CT chest w contrast 11/04/18 Cardiovascular: Mild atherosclerosis of the aorta, great vessels and coronary arteries. No acute vascular findings. The heart size is normal. There is no pericardial effusion.  Study Result CLINICAL DATA:  Newly diagnosed extra mammary Paget's disease. History basal cell skin cancer and melanoma.   EXAM: CT CHEST, ABDOMEN, AND PELVIS WITH CONTRAST   TECHNIQUE: Multidetector CT imaging of the chest, abdomen and pelvis was performed following the standard protocol during bolus administration of intravenous contrast.   CONTRAST:  132m OMNIPAQUE IOHEXOL 300 MG/ML  SOLN   COMPARISON:  Abdominal ultrasound 03/12/2013   FINDINGS: CT CHEST FINDINGS   Cardiovascular: Mild atherosclerosis of the aorta, great vessels and coronary arteries. No acute vascular findings. The heart size is normal. There is no pericardial effusion.   Mediastinum/Nodes: There are no enlarged mediastinal, hilar or axillary lymph nodes. The thyroid gland, trachea and esophagus demonstrate no significant findings.   Lungs/Pleura: There is no pleural effusion or pneumothorax. There is mild centrilobular emphysema. In the left  lower lobe, there is a 3 mm nodule on image 105/3. No suspicious pulmonary nodules.   Musculoskeletal/Chest wall: No chest wall mass or suspicious osseous findings. No evidence of breast mass.   CT ABDOMEN AND PELVIS FINDINGS   Hepatobiliary: The liver is normal in density without suspicious focal abnormality. No significant biliary dilatation post cholecystectomy.   Pancreas: Unremarkable. No pancreatic ductal dilatation or surrounding inflammatory changes.   Spleen: Normal in size without focal abnormality.   Adrenals/Urinary Tract: 14 mm left adrenal nodule on image 60/2 has a density 4 HU, consistent with a small adenoma. The right adrenal gland appears normal. Small low-density renal lesions are present, consistent with cysts, largest in the lower pole of the left kidney measuring 19 mm on image 79/2. No evidence of solid renal mass, urinary tract calculus or hydronephrosis. The bladder appears normal.  Stomach/Bowel: No evidence of bowel wall thickening, distention or surrounding inflammatory change. The appendix is surgically absent by report. There are mild sigmoid colon diverticular changes.   Vascular/Lymphatic: There are no enlarged abdominal or pelvic lymph nodes. Aortic and branch vessel atherosclerosis. No acute vascular findings.   Reproductive: Minimal enlargement of the prostate gland without focal abnormality. The seminal vesicles appear normal.   Other: Postsurgical changes in the anterior abdominal wall suggesting previous hernia repair. No recurrent hernia, abdominal wall mass or ascites.   Musculoskeletal: No acute or significant osseous findings. There are degenerative changes throughout the spine associated with a thoracolumbar scoliosis. There is osseous foraminal narrowing in the lower lumbar spine, especially on the right at L4-5 and to the left at L5-S1.   IMPRESSION: 1. No evidence of primary malignancy or metastatic disease within the  chest, abdomen or pelvis. 2. Tiny left lower lobe pulmonary nodule. This appearance is almost certainly benign, and no dedicated follow-up is required if this patient is low risk for bronchogenic carcinoma (and has no known or suspected primary neoplasm). Non-contrast chest CT can be considered in 12 months if patient is high-risk. This recommendation follows the consensus statement: Guidelines for Management of Incidental Pulmonary Nodules Detected on CT Images: From the Fleischner Society 2017; Radiology 2017; 284:228-243. 3. Small left adrenal adenoma, mild sigmoid diverticulosis and lumbar spondylosis. 4. Aortic Atherosclerosis (ICD10-I70.0).   CT abd/pelvis 01/17/19 IMPRESSION: 1. 5.6 x 8.4 x 14.2 cm curvilinear hematoma extending from the left inguinal region into the scrotal sac. No visible active contrast extravasation. 2. Other acute abnormality within the abdomen and pelvis. 3. Colonic diverticulosis without evidence for acute diverticulitis. 4.  Aortic Atherosclerosis (ICD10-I70.0).  EKG:  EKG is ordered today.  The ekg ordered today demonstrates sinus rhythm first-degree AV block 66 bpm with no acute ST/T wave changes  Recent Labs: 05/22/2018: ALT 36 01/17/2019: BUN 21; Creatinine, Ser 0.87; Hemoglobin 12.8; Platelets 356; Potassium 4.5; Sodium 135  Recent Lipid Panel    Component Value Date/Time   CHOL 107 05/22/2018 1019   CHOL 179 07/21/2014 0935   TRIG 251.0 (H) 05/22/2018 1019   TRIG 192 10/03/2007 0000   HDL 31.00 (L) 05/22/2018 1019   HDL 31 (L) 07/21/2014 0935   CHOLHDL 3 05/22/2018 1019   VLDL 50.2 (H) 05/22/2018 1019   LDLCALC 43 03/21/2017 0903   LDLCALC 74 07/21/2014 0935   LDLDIRECT 44.0 05/22/2018 1019    Home Medications   Current Meds  Medication Sig  . albuterol (PROVENTIL HFA;VENTOLIN HFA) 108 (90 Base) MCG/ACT inhaler INHALE 2 PUFFS BY MOUTH EVERY 6 HOURS AS NEEDED FOR WHEEZING  . aspirin 81 MG EC tablet Take 1 tablet (81 mg total) by  mouth daily.  Marland Kitchen atenolol (TENORMIN) 25 MG tablet TAKE 1 TABLET BY MOUTH EVERY DAY  . atorvastatin (LIPITOR) 20 MG tablet TAKE 1 TABLET BY MOUTH EVERY DAY  . Blood Glucose Monitoring Suppl (BLOOD GLUCOSE MONITOR SYSTEM) w/Device KIT 1 each by Does not apply route daily. Use as directed to check blood sugar once daily  . diclofenac sodium (VOLTAREN) 1 % GEL APPLY 2 G TOPICALLY 3 (THREE) TIMES DAILY AS NEEDED (ANTI INFLAMMATORY).  Marland Kitchen doxycycline (VIBRAMYCIN) 100 MG capsule Take 1 capsule (100 mg total) by mouth 2 (two) times daily.  Marland Kitchen gabapentin (NEURONTIN) 300 MG capsule TAKE 1 CAPSULE BY MOUTH TWICE A DAY - 1 IN THE MORNING AND 1 AT BEDTIME ALONG WITH 100 MG CAPSULE  . Glucose Blood (BLOOD GLUCOSE TEST STRIPS)  STRP 1 each by In Vitro route daily. Use as directed to check blood sugar once daily  . ibuprofen (ADVIL,MOTRIN) 200 MG tablet Take 400-600 mg by mouth every 6 (six) hours as needed for mild pain (DEPENDS ON PAIN IF TAKES 400-600 MG).   . Lancets MISC 1 each by Does not apply route daily. Use as directed to check blood sugar daily  . losartan (COZAAR) 50 MG tablet Take 1 tablet (50 mg total) by mouth 2 (two) times daily. Please call to schedule office visit for further refills. Thank you!  . metFORMIN (GLUCOPHAGE) 1000 MG tablet TAKE 1 TABLET BY MOUTH TWICE A DAY WITH FOOD    Review of Systems      Review of Systems  Constitution: Negative for chills, fever and malaise/fatigue.  Cardiovascular: Negative for chest pain, dyspnea on exertion, leg swelling, near-syncope, orthopnea, palpitations and syncope.  Respiratory: Negative for cough, shortness of breath and wheezing.   Gastrointestinal: Negative for nausea and vomiting.  Neurological: Negative for dizziness, light-headedness and weakness.   All other systems reviewed and are otherwise negative except as noted above.  Physical Exam    VS:  BP 124/66 (BP Location: Left Arm, Patient Position: Sitting, Cuff Size: Normal)   Pulse 66    Ht 5' 8"  (1.727 m)   Wt 241 lb 8 oz (109.5 kg)   SpO2 96%   BMI 36.72 kg/m  , BMI Body mass index is 36.72 kg/m. GEN: Well nourished, ell developed, in no acute distress. HEENT: normal. Neck: Supple, no JVD, carotid bruits, or masses. Cardiac: RRR, no murmurs, rubs, or gallops. No clubbing, cyanosis, edema.  Radials/DP/PT 2+ and equal bilaterally.  Respiratory:  Respirations regular and unlabored, clear to auscultation bilaterally. GI: Soft, nontender, nondistended, BS + x 4. MS: No deformity or atrophy. Skin: Warm and dry, no rash. Neuro:  Strength and sensation are intact. Psych: Normal affect.  Accessory Clinical Findings    ECG personally reviewed by me today -sinus rhythm 66 bpm first-degree AV block rate 66 bpm, PR 212, no acute ST/T wave changes- no acute changes.  Assessment & Plan    1. CAD - Previously recommended for medical management after cath 11/2007 with 40% RCA, normal LVEF.  He had an episode of chest pain in June, evaluated in the ED with no acute findings, recommended for outpatient stress test at that time.  He did not complete as he was subsequently treated for Mohs.  He has not had recurrent chest pain similar to his anginal equivalent, his chest discomfort is intermittent and very atypical for angina-occurring at rest, not exacerbated by activity, self resolving. His EKG today shows SR with 1st degree AV block and no acute ST/T wave changes. We will defer ischemic evaluation at this time.  Patient is agreeable to this plan of care.  Continue GDMT aspirin, beta-blocker, statin.    Prescribe as needed nitroglycerin at this time as he has run out.    Recommend low-sodium, heart healthy diet.    Recommend regular cardiovascular exercise.  2. HTN - BP well controlled. Continue present anti-hypertensive regimen of losartan 50 mg daily, atenolol 25 mg.  3. DM2 - Follows with PCP.  If additional agent needed consider SGLT2 I for cardioprotective benefit.  4. HLD -  Lipid panel 05/22/18 with LDL 44 at goal of <70.  Continue atorvastatin 20 mg daily.  Disposition: Follow up in 2 month(s) with Dr. Rockey Situ as previously scheduled.   Loel Dubonnet, NP 02/11/2019, 9:10 AM

## 2019-02-11 NOTE — Patient Instructions (Addendum)
Medication Instructions:  Your physician has recommended you make the following change in your medication:   START Nitroglycerin as needed for chest pain   Dissolve 1 tablet (0.4 mg) under your tongue every 5 minutes as needed for chest pain. Do not  take more than 3 doses. If chest pain does not resolve, then call 911 or go to the Emergency  Room.    *If you need a refill on your cardiac medications before your next appointment, please call your pharmacy*  Lab Work:  No lab work today.  Testing/Procedures: You had an EKG today. It showed sinus rhythm and no acute changes. This is a good result!  Follow-Up: At [facility], you and your health needs are our priority.  As part of our continuing mission to provide you with exceptional heart care, we have created designated Provider Care Teams.  These Care Teams include your primary Cardiologist (physician) and Advanced Practice Providers (APPs -  Physician Assistants and Nurse Practitioners) who all work together to provide you with the care you need, when you need it.  Your next appointment:   In March in person with Dr. Rockey Situ as previously scheduled

## 2019-03-25 LAB — HM DIABETES EYE EXAM

## 2019-03-26 ENCOUNTER — Encounter: Payer: Self-pay | Admitting: Family Medicine

## 2019-04-04 ENCOUNTER — Other Ambulatory Visit: Payer: Self-pay | Admitting: Cardiovascular Disease

## 2019-04-05 NOTE — Progress Notes (Signed)
Cardiology Office Note  Date:  04/06/2019   ID:  Casey Golden, DOB [birth year], MRN [id number]  PCP:  Ria Bush, MD   Chief Complaint  Patient presents with  . office visit    1 month F/U; Meds verbally reviewed with patient.    HPI:  Casey Golden is a very pleasant 69 year old gentleman returns today for evaluation and management of his coronary artery disease,  Former smoker, quit 1990,  smoking for 27 years,   He has 50% mid RCA disease by cardiac catheterization in 2009 with 30% also LAD disease  mixed hyperlipidemia, hypertension.  syncopal episode many years ago while walking his dog. He was down for one minute. He had no warning. He has not had any further episodes since that time Extramammary Paget's disease of scrotum Who presents for follow-up of his coronary artery disease  Works at [employer]  Seen by dermatology Extramammary Paget's disease of scrotum , surgery at [place] op hematoma next day CT scan: 5.6 x 8.4 x 14.2 cm curvilinear hematoma extending from the left inguinal region into the scrotal sac.   Labs reviewed HBA1C 6.3 CR 0.87, BUN 21 Total chol 107, LDL 44  No chest pain or SOB on exertion Weight stable  Chronic back pain , stable CT scan: degenerative changes throughout the spine associated with a thoracolumbar scoliosis. There is osseous foraminal narrowing in the lower lumbar spine, especially on the right at L4-5 and to the left at L5-S1.  EKG personally reviewed by myself on todays visit Shows normal sinus rhythm with rate 72 bpm no significant ST or T wave changes  Other past medical history reviewed Cardiac catheterization report 05/13/2006 detailing normal ejection fraction, 50% mid RCA disease  cardiac catheterization November 2009 showing 40% mid RCA disease, normal ejection fraction   testicular surgery 09/14/2014 in [place] with Dr. Junious Silk.  positive Myoview several years ago which resulted in a heart  catheterization.  He had nonobstructive disease. His normal left ventricular function.   PMH:   has a past medical history of Arthritis, CAD (coronary artery disease), Cancer ([place]), Cataract (2019), Colonic polyp, Fatty liver disease, nonalcoholic (1224), GERD (gastroesophageal reflux disease), History of chicken pox, Hyperlipidemia, Hypertension, Nocturia, OSA (obstructive sleep apnea), Paget's disease of bony pelvis, Past use of tobacco, Rash of genital area, Seasonal and perennial allergic rhinitis, Type 2 diabetes mellitus ([place]), Wears dentures, and Wears glasses.  PSH:    Past Surgical History:  Procedure Laterality Date  . ABDOMINAL HERNIA REPAIR  2007      [facility]   open repair  . APPENDECTOMY  age 23  . CARDIAC CATHETERIZATION  12-23-2007   [facility]   Abnormal myoview w/ ischemia/  40% mRCA with nonobstructive and no sig. plaque in his left system, EF 55%  . CARDIAC CATHETERIZATION  Apr 2008    [facility]   Abnormal myoview/  50% RCA,  ef 65%  . CARDIAC CATHETERIZATION  1999      [facility]  . CATARACT EXTRACTION, BILATERAL Bilateral 11/2017  . CERVICAL FUSION  1992  . CHOLECYSTECTOMY OPEN  2006  . COLONOSCOPY WITH PROPOFOL N/A 04/17/2016   TAs, high grade dysplasia with margins clear, diverticulosis Lucilla Lame, MD)  . COLONOSCOPY WITH PROPOFOL N/A 08/07/2016   TAx1, diverticulosis, rpt 3 yrs ([facility])  . DENTAL SURGERY     metal dental implant L mandible  . EXCISION OF SKIN TAG Right 09/14/2014   Procedure: EXCISION OF SKIN TAG;  Surgeon: Festus Aloe, MD;  Location: [place];  Service: Urology;  Laterality: Right;  . GROIN MASS OPEN BIOPSY Left 01/17/2019  . HYDROCELE EXCISION Left 09/14/2014   Procedure: LEFT HYDROCELECTOMY ADULT;  Surgeon: Festus Aloe, MD;  [facility];  Service: Urology;  Laterality: Left;  . MOHS SURGERY  2015   skin cancer  . TONSILLECTOMY  age 77    Current Outpatient Medications  Medication Sig Dispense Refill  .  albuterol (PROVENTIL HFA;VENTOLIN HFA) 108 (90 Base) MCG/ACT inhaler INHALE 2 PUFFS BY MOUTH EVERY 6 HOURS AS NEEDED FOR WHEEZING 8.5 Inhaler 2  . aspirin 81 MG EC tablet Take 1 tablet (81 mg total) by mouth daily. 30 tablet 12  . atenolol (TENORMIN) 25 MG tablet TAKE 1 TABLET BY MOUTH EVERY DAY 90 tablet 3  . atorvastatin (LIPITOR) 20 MG tablet TAKE 1 TABLET BY MOUTH EVERY DAY 90 tablet 3  . Blood Glucose Monitoring Suppl (BLOOD GLUCOSE MONITOR SYSTEM) w/Device KIT 1 each by Does not apply route daily. Use as directed to check blood sugar once daily 1 each 0  . diclofenac sodium (VOLTAREN) 1 % GEL APPLY 2 G TOPICALLY 3 (THREE) TIMES DAILY AS NEEDED (ANTI INFLAMMATORY). 100 g 1  . gabapentin (NEURONTIN) 300 MG capsule TAKE 1 CAPSULE BY MOUTH TWICE A DAY - 1 IN THE MORNING AND 1 AT BEDTIME ALONG WITH 100 MG CAPSULE 180 capsule 3  . Glucose Blood (BLOOD GLUCOSE TEST STRIPS) STRP 1 each by In Vitro route daily. Use as directed to check blood sugar once daily 100 each 0  . ibuprofen (ADVIL,MOTRIN) 200 MG tablet Take 400-600 mg by mouth every 6 (six) hours as needed for mild pain (DEPENDS ON PAIN IF TAKES 400-600 MG).     . Lancets MISC 1 each by Does not apply route daily. Use as directed to check blood sugar daily 100 each 0  . losartan (COZAAR) 50 MG tablet Take 1 tablet (50 mg total) by mouth 2 (two) times daily. Please call to schedule office visit for further refills. Thank you! 180 tablet 0  . metFORMIN (GLUCOPHAGE) 1000 MG tablet TAKE 1 TABLET BY MOUTH TWICE A DAY WITH FOOD 180 tablet 2  . nitroGLYCERIN (NITROSTAT) 0.4 MG SL tablet Place 1 tablet (0.4 mg total) under the tongue every 5 (five) minutes as needed for chest pain (do not take more than 3 doses.). 25 tablet 3  . doxycycline (VIBRAMYCIN) 100 MG capsule Take 1 capsule (100 mg total) by mouth 2 (two) times daily. 20 capsule 0   No current facility-administered medications for this visit.     Allergies:   Sulfa antibiotics and Sulfur    Social History:  The patient  reports that he quit smoking about 29 years ago. His smoking use included cigarettes. He has a 70.00 pack-year smoking history. He has never used smokeless tobacco. He reports that he does not drink alcohol or use drugs.   Family History:   family history includes CAD in his mother; Cancer in his sister; Cancer (age of onset: 46) in his father; Diabetes in his mother and paternal grandfather; Heart disease in his paternal grandfather and paternal grandmother; Stroke in his mother.    Review of Systems: Review of Systems  Constitutional: Negative.   HENT: Negative.   Respiratory: Negative.   Cardiovascular: Negative.   Gastrointestinal: Negative.   Musculoskeletal: Positive for back pain and joint pain.  Neurological: Negative.   Psychiatric/Behavioral: Negative.   All other systems reviewed and are  negative.   PHYSICAL EXAM: VS:  BP 136/78 (BP Location: Left Arm, Patient Position: Sitting, Cuff Size: Normal)   Pulse 72   Ht 5' 10"  (1.778 m)   Wt 241 lb (109.3 kg)   SpO2 95%   BMI 34.58 kg/m  , BMI Body mass index is 34.58 kg/m. Constitutional:  oriented to person, place, and time. No distress.  HENT:  Head: Grossly normal Eyes:  no discharge. No scleral icterus.  Neck: No JVD, no carotid bruits  Cardiovascular: Regular rate and rhythm, no murmurs appreciated Pulmonary/Chest: Clear to auscultation bilaterally, no wheezes or rails Abdominal: Soft.  no distension.  no tenderness.  Musculoskeletal: Normal range of motion Neurological:  normal muscle tone. Coordination normal. No atrophy Skin: Skin warm and dry Psychiatric: normal affect, pleasant -  Recent Labs: 05/22/2018: ALT 36 01/17/2019: BUN 21; Creatinine, Ser 0.87; Hemoglobin 12.8; Platelets 356; Potassium 4.5; Sodium 135    Lipid Panel Lab Results  Component Value Date   CHOL 107 05/22/2018   HDL 31.00 (L) 05/22/2018   LDLCALC 43 03/21/2017   TRIG 251.0 (H) 05/22/2018       Wt Readings from Last 3 Encounters:  04/06/19 241 lb (109.3 kg)  02/11/19 241 lb 8 oz (109.5 kg)  02/05/19 240 lb 6 oz (109 kg)      ASSESSMENT AND PLAN:  Coronary artery disease involving native coronary artery of native heart without angina pectoris - Plan: EKG 12-Lead Currently with no symptoms of angina. No further workup at this time. Continue current medication regimen. Lipid panel at goal, diabetes numbers at goal  Essential hypertension Blood pressure well controlled, no changes made Stable  Uncontrolled type 2 diabetes with neuropathy (HCC) Hemoglobin A1c improved Weight stable, overall well controlled  Osteoarthritis Chronic knee pain, back pain Long hours of working at [employer]  Chronic midline low back pain, with sciatica presence unspecified Previously seen by chiropractor Continues to work, prior back surgery Symptoms stable  Disposition:   F/U  12 months   Total encounter time more than 25 minutes  Greater than 50% was spent in counseling and coordination of care with the patient    Orders Placed This Encounter  Procedures  . EKG 12-Lead     Signed, Esmond Plants, M.D., Ph.D. 04/06/2019  [place], [place]

## 2019-04-06 ENCOUNTER — Encounter: Payer: Self-pay | Admitting: Cardiovascular Disease

## 2019-04-06 ENCOUNTER — Ambulatory Visit (INDEPENDENT_AMBULATORY_CARE_PROVIDER_SITE_OTHER): Payer: 59 | Admitting: Cardiovascular Disease

## 2019-04-06 ENCOUNTER — Other Ambulatory Visit: Payer: Self-pay

## 2019-04-06 VITALS — BP 136/78 | HR 72 | Ht 70.0 in | Wt 241.0 lb

## 2019-04-06 DIAGNOSIS — E1140 Type 2 diabetes mellitus with diabetic neuropathy, unspecified: Secondary | ICD-10-CM

## 2019-04-06 DIAGNOSIS — I10 Essential (primary) hypertension: Secondary | ICD-10-CM

## 2019-04-06 DIAGNOSIS — I25118 Atherosclerotic heart disease of native coronary artery with other forms of angina pectoris: Secondary | ICD-10-CM | POA: Diagnosis not present

## 2019-04-06 DIAGNOSIS — E782 Mixed hyperlipidemia: Secondary | ICD-10-CM | POA: Diagnosis not present

## 2019-04-06 DIAGNOSIS — I2510 Atherosclerotic heart disease of native coronary artery without angina pectoris: Secondary | ICD-10-CM

## 2019-04-06 DIAGNOSIS — IMO0002 Reserved for concepts with insufficient information to code with codable children: Secondary | ICD-10-CM

## 2019-04-06 DIAGNOSIS — E1165 Type 2 diabetes mellitus with hyperglycemia: Secondary | ICD-10-CM

## 2019-04-06 NOTE — Patient Instructions (Signed)

## 2019-04-09 ENCOUNTER — Encounter: Payer: Self-pay | Admitting: Podiatry

## 2019-04-09 ENCOUNTER — Other Ambulatory Visit: Payer: Self-pay

## 2019-04-09 ENCOUNTER — Ambulatory Visit (INDEPENDENT_AMBULATORY_CARE_PROVIDER_SITE_OTHER): Payer: 59 | Admitting: Podiatry

## 2019-04-09 VITALS — Temp 97.6°F

## 2019-04-09 DIAGNOSIS — B351 Tinea unguium: Secondary | ICD-10-CM | POA: Diagnosis not present

## 2019-04-09 DIAGNOSIS — E1142 Type 2 diabetes mellitus with diabetic polyneuropathy: Secondary | ICD-10-CM

## 2019-04-09 DIAGNOSIS — M79674 Pain in right toe(s): Secondary | ICD-10-CM | POA: Diagnosis not present

## 2019-04-09 DIAGNOSIS — E118 Type 2 diabetes mellitus with unspecified complications: Secondary | ICD-10-CM

## 2019-04-09 DIAGNOSIS — M79675 Pain in left toe(s): Secondary | ICD-10-CM

## 2019-04-09 NOTE — Progress Notes (Signed)
This patient returns to my office for at risk foot care.  This patient requires this care by a professional since this patient will be at risk due to having  Diabetes.  This patient is unable to cut nails himself since the patient cannot reach his  nails.These nails are painful walking and wearing shoes.  This patient presents for at risk foot care today. Patient questions status of his shoes.  General Appearance  Alert, conversant and in no acute stress.  Vascular  Dorsalis pedis and posterior tibial  pulses are palpable  bilaterally.  Capillary return is within normal limits  bilaterally. Temperature is within normal limits  bilaterally.  Neurologic  Senn-Weinstein monofilament wire test absent  bilaterally. Muscle power within normal limits bilaterally.  Nails Thick disfigured discolored nails with subungual debris  from hallux to fifth toes bilaterally. No evidence of bacterial infection or drainage bilaterally.  Orthopedic  No limitations of motion  feet .  No crepitus or effusions noted.  No bony pathology or digital deformities noted.  HAV with hallux malleus  B/L.  Hammer toes 2-5  B/L.  Skin  normotropic skin with no porokeratosis noted bilaterally.  No signs of infections or ulcers noted.     Onychomycosis  Pain in right toes  Pain in left toes  Consent was obtained for treatment procedures.   Mechanical debridement of nails 1-5  bilaterally performed with a nail nipper.  Filed with dremel without incident. No infection or ulcer.     Return office visit   10 weeks        Told patient to return for periodic foot care and evaluation due to potential at risk complications.   Gardiner Barefoot DPM

## 2019-04-13 ENCOUNTER — Other Ambulatory Visit: Payer: Self-pay | Admitting: Cardiovascular Disease

## 2019-04-13 ENCOUNTER — Ambulatory Visit: Payer: Medicare Other | Admitting: Podiatry

## 2019-04-15 ENCOUNTER — Ambulatory Visit: Payer: Medicare Other | Admitting: Orthotics

## 2019-04-29 ENCOUNTER — Other Ambulatory Visit: Payer: Self-pay

## 2019-04-29 ENCOUNTER — Ambulatory Visit (INDEPENDENT_AMBULATORY_CARE_PROVIDER_SITE_OTHER): Payer: 59 | Admitting: Orthotics

## 2019-04-29 DIAGNOSIS — E1142 Type 2 diabetes mellitus with diabetic polyneuropathy: Secondary | ICD-10-CM

## 2019-04-29 DIAGNOSIS — M2011 Hallux valgus (acquired), right foot: Secondary | ICD-10-CM | POA: Diagnosis not present

## 2019-04-29 DIAGNOSIS — M79675 Pain in left toe(s): Secondary | ICD-10-CM | POA: Diagnosis not present

## 2019-04-29 DIAGNOSIS — B351 Tinea unguium: Secondary | ICD-10-CM

## 2019-04-29 DIAGNOSIS — E118 Type 2 diabetes mellitus with unspecified complications: Secondary | ICD-10-CM

## 2019-04-29 NOTE — Progress Notes (Signed)

## 2019-05-27 ENCOUNTER — Ambulatory Visit (INDEPENDENT_AMBULATORY_CARE_PROVIDER_SITE_OTHER): Payer: 59 | Admitting: Family Medicine

## 2019-05-27 ENCOUNTER — Encounter: Payer: Self-pay | Admitting: Family Medicine

## 2019-05-27 ENCOUNTER — Other Ambulatory Visit: Payer: Self-pay

## 2019-05-27 VITALS — BP 120/64 | HR 63 | Temp 97.8°F | Ht 68.25 in | Wt 237.1 lb

## 2019-05-27 DIAGNOSIS — E785 Hyperlipidemia, unspecified: Secondary | ICD-10-CM

## 2019-05-27 DIAGNOSIS — J432 Centrilobular emphysema: Secondary | ICD-10-CM

## 2019-05-27 DIAGNOSIS — R911 Solitary pulmonary nodule: Secondary | ICD-10-CM

## 2019-05-27 DIAGNOSIS — R972 Elevated prostate specific antigen [PSA]: Secondary | ICD-10-CM

## 2019-05-27 DIAGNOSIS — I10 Essential (primary) hypertension: Secondary | ICD-10-CM

## 2019-05-27 DIAGNOSIS — C4499 Other specified malignant neoplasm of skin, unspecified: Secondary | ICD-10-CM | POA: Diagnosis not present

## 2019-05-27 DIAGNOSIS — E1169 Type 2 diabetes mellitus with other specified complication: Secondary | ICD-10-CM

## 2019-05-27 DIAGNOSIS — K760 Fatty (change of) liver, not elsewhere classified: Secondary | ICD-10-CM

## 2019-05-27 DIAGNOSIS — Z0000 Encounter for general adult medical examination without abnormal findings: Secondary | ICD-10-CM | POA: Insufficient documentation

## 2019-05-27 DIAGNOSIS — G8929 Other chronic pain: Secondary | ICD-10-CM

## 2019-05-27 DIAGNOSIS — I700 Atherosclerosis of aorta: Secondary | ICD-10-CM

## 2019-05-27 DIAGNOSIS — I2510 Atherosclerotic heart disease of native coronary artery without angina pectoris: Secondary | ICD-10-CM

## 2019-05-27 DIAGNOSIS — E118 Type 2 diabetes mellitus with unspecified complications: Secondary | ICD-10-CM | POA: Diagnosis not present

## 2019-05-27 DIAGNOSIS — IMO0001 Reserved for inherently not codable concepts without codable children: Secondary | ICD-10-CM

## 2019-05-27 DIAGNOSIS — M545 Low back pain, unspecified: Secondary | ICD-10-CM

## 2019-05-27 DIAGNOSIS — Z7189 Other specified counseling: Secondary | ICD-10-CM

## 2019-05-27 LAB — COMPREHENSIVE METABOLIC PANEL
ALT: 39 U/L (ref 0–53)
AST: 37 U/L (ref 0–37)
Albumin: 4.3 g/dL (ref 3.5–5.2)
Alkaline Phosphatase: 67 U/L (ref 39–117)
BUN: 14 mg/dL (ref 6–23)
CO2: 33 mEq/L — ABNORMAL HIGH (ref 19–32)
Calcium: 9.8 mg/dL (ref 8.4–10.5)
Chloride: 100 mEq/L (ref 96–112)
Creatinine, Ser: 0.84 mg/dL (ref 0.40–1.50)
GFR: 90.06 mL/min (ref >60.00)
Glucose, Bld: 103 mg/dL — ABNORMAL HIGH (ref 70–99)
Potassium: 4.8 mEq/L (ref 3.5–5.1)
Sodium: 139 mEq/L (ref 135–145)
Total Bilirubin: 0.8 mg/dL (ref 0.2–1.2)
Total Protein: 7.5 g/dL (ref 6.0–8.3)

## 2019-05-27 LAB — LDL CHOLESTEROL, DIRECT: Direct LDL: 81 mg/dL

## 2019-05-27 LAB — LIPID PANEL
Cholesterol: 176 mg/dL (ref 0–200)
HDL: 32.3 mg/dL — ABNORMAL LOW (ref >39.00)
Total CHOL/HDL Ratio: 5
Triglycerides: 428 mg/dL — ABNORMAL HIGH (ref 0.0–149.0)

## 2019-05-27 LAB — HEMOGLOBIN A1C: Hgb A1c MFr Bld: 6.5 % (ref 4.6–6.5)

## 2019-05-27 NOTE — Assessment & Plan Note (Signed)

## 2019-05-27 NOTE — Assessment & Plan Note (Signed)
Chronic, stable. Update labs on metformin.

## 2019-05-27 NOTE — Assessment & Plan Note (Addendum)
NAFLD by [place]. Previously on tricor. Update LFTs

## 2019-05-27 NOTE — Progress Notes (Signed)
This visit was conducted in person.  BP 120/64 (BP Location: Left Arm, Patient Position: Sitting, Cuff Size: Large)   Pulse 63   Temp 97.8 F (36.6 C) (Temporal)   Ht 5' 8.25" (1.734 m)   Wt 237 lb 2 oz (107.6 kg)   SpO2 95%   BMI 35.79 kg/m    CC: CPE Subjective:    Patient ID: Casey Golden, male    DOB: 1950/03/24, 69 y.o.   MRN: [id number]  HPI: Casey Golden is a 69 y.o. male presenting on 05/27/2019 for Medicare Wellness   Planning to go part time this summer.  Did not see health advisor this year.    Hearing Screening   _0  _1  _2  _3  _4  _5  _6  _7  _8   Right ear:   40 40 40  0    Left ear:   40 0 0  0    Comments: Told he needs hearing aids.   Vision Screening Comments: Last eye exam, 01/2019.     Clinical Support from 05/21/2018 in [place] at [facility] Total Score  0    Considering L ear hearing aide -  Fall Risk  05/27/2019 05/21/2018 11/01/2017 07/01/2017 05/13/2017  Falls in the past year? 0 0 No No No    S/p surgical excision 12/2018 of L perineal extramammary paget's disease. Subsequent panCT reassuring, only noted incidental mild centrilobular emphysema as well as 30m LLL nodule (rec rpt CT 1 yr give fmhx lung cancer) and 1.4cm L adrenal adenoma and bilateral renal cysts.   Saw Dr GRockey Siturecently - good report.  Saw dermatologist last month - good report as well.   Preventative: COLONOSCOPY WITH PROPOFOL3/20/2018 TAs, high grade dysplasia with margins clear, diverticulosis (Lucilla Lame MD) COLONOSCOPY WITH PROPOFOL 08/07/2016 TAx1, diverticulosis, rpt 3 yrs (Allen Norris- may be due this summer  Prostate cancer screening - [place] [facility] watching elevated PSA. Last seen late 2020. Pt states he had biopsy but doesn't remember results.  Lung cancer screening - does not meet criteria  Flu shot - yearly Td 2017 Pneumovax 2013, prevnar 2017 zostavax-08/2012 shingrix - completed 2019 COVID vaccine -  [place] Advanced directive discussion -scanned and in chart 12/2016.Does not want prolonged life support if terminal condition. No HCPOA form filled out. Would want wife [name] daughter [name] be HCPOA.  Seat belt use discussed. Sunscreen use discussed,no changing moles on skin. Sees derm yearly. Ex smoker quit 1992  Alcohol - none Dentist yearly - has upper and lower partial dentures Eye exam yearly Bowel - no constipation - good fiber in diet.  Bladder - no incontinence.   Lives with wife, dog and cats  Occupation: retired, was self employed, now works at [employer]  Edu: HS Activity: walks 1.5 mi daily Diet: some water, fruits/vegetables daily     Relevant past medical, surgical, family and social history reviewed and updated as indicated. Interim medical history since our last visit reviewed. Allergies and medications reviewed and updated. Outpatient Medications Prior to Visit  Medication Sig Dispense Refill  . albuterol (PROVENTIL HFA;VENTOLIN HFA) 108 (90 Base) MCG/ACT inhaler INHALE 2 PUFFS BY MOUTH EVERY 6 HOURS AS NEEDED FOR WHEEZING 8.5 Inhaler 2  . aspirin 81 MG EC tablet Take 1 tablet (81 mg total) by mouth daily. 30 tablet 12  . atenolol (TENORMIN) 25 MG tablet TAKE 1 TABLET BY MOUTH EVERY DAY 90 tablet 3  . atorvastatin (LIPITOR) 20 MG tablet TAKE 1 TABLET  BY MOUTH EVERY DAY 90 tablet 3  . Blood Glucose Monitoring Suppl (BLOOD GLUCOSE MONITOR SYSTEM) w/Device KIT 1 each by Does not apply route daily. Use as directed to check blood sugar once daily 1 each 0  . diclofenac sodium (VOLTAREN) 1 % GEL APPLY 2 G TOPICALLY 3 (THREE) TIMES DAILY AS NEEDED (ANTI INFLAMMATORY). 100 g 1  . doxycycline (VIBRAMYCIN) 100 MG capsule Take 1 capsule (100 mg total) by mouth 2 (two) times daily. 20 capsule 0  . gabapentin (NEURONTIN) 100 MG capsule Take 100 mg by mouth at bedtime. As needed    . gabapentin (NEURONTIN) 300 MG capsule Take 300 mg by mouth 3  (three) times daily.    . Glucose Blood (BLOOD GLUCOSE TEST STRIPS) STRP 1 each by In Vitro route daily. Use as directed to check blood sugar once daily 100 each 0  . ibuprofen (ADVIL,MOTRIN) 200 MG tablet Take 400-600 mg by mouth every 6 (six) hours as needed for mild pain (DEPENDS ON PAIN IF TAKES 400-600 MG).     . Lancets MISC 1 each by Does not apply route daily. Use as directed to check blood sugar daily 100 each 0  . losartan (COZAAR) 50 MG tablet TAKE 1 TABLET BY MOUTH 2 (TWO) TIMES DAILY. PLEASE CALL TO SCHEDULE OFFICE VISIT FOR MORE REFILLS 180 tablet 3  . metFORMIN (GLUCOPHAGE) 1000 MG tablet TAKE 1 TABLET BY MOUTH TWICE A DAY WITH FOOD 180 tablet 2  . nitroGLYCERIN (NITROSTAT) 0.4 MG SL tablet Place 1 tablet (0.4 mg total) under the tongue every 5 (five) minutes as needed for chest pain (do not take more than 3 doses.). 25 tablet 3   No facility-administered medications prior to visit.     Per HPI unless specifically indicated in ROS section below Review of Systems  Constitutional: Negative for activity change, appetite change, chills, fatigue, fever and unexpected weight change.  HENT: Negative for hearing loss.   Eyes: Negative for visual disturbance.  Respiratory: Negative for cough, chest tightness, shortness of breath and wheezing.   Cardiovascular: Negative for chest pain, palpitations and leg swelling.  Gastrointestinal: Negative for abdominal distention, abdominal pain, blood in stool, constipation, diarrhea, nausea and vomiting.  Genitourinary: Negative for difficulty urinating and hematuria.  Musculoskeletal: Negative for arthralgias, myalgias and neck pain.  Skin: Negative for rash.  Neurological: Negative for dizziness, seizures, syncope and headaches.  Hematological: Negative for adenopathy. Does not bruise/bleed easily.  Psychiatric/Behavioral: Negative for dysphoric mood. The patient is not nervous/anxious.    Objective:  BP 120/64 (BP Location: Left Arm, Patient  Position: Sitting, Cuff Size: Large)   Pulse 63   Temp 97.8 F (36.6 C) (Temporal)   Ht 5' 8.25" (1.734 m)   Wt 237 lb 2 oz (107.6 kg)   SpO2 95%   BMI 35.79 kg/m   Wt Readings from Last 3 Encounters:  05/27/19 237 lb 2 oz (107.6 kg)  04/06/19 241 lb (109.3 kg)  02/11/19 241 lb 8 oz (109.5 kg)      Physical Exam Vitals and nursing note reviewed.  Constitutional:      General: He is not in acute distress.    Appearance: Normal appearance. He is well-developed. He is not ill-appearing.  HENT:     Head: Normocephalic and atraumatic.     Right Ear: Hearing, tympanic membrane, ear canal and external ear normal.     Left Ear: Hearing, tympanic membrane, ear canal and external ear normal.  Eyes:     General:  No scleral icterus.    Extraocular Movements: Extraocular movements intact.     Conjunctiva/sclera: Conjunctivae normal.     Pupils: Pupils are equal, round, and reactive to light.  Neck:     Thyroid: No thyromegaly or thyroid tenderness.     Vascular: No carotid bruit.  Cardiovascular:     Rate and Rhythm: Normal rate and regular rhythm.     Pulses: Normal pulses.          Radial pulses are 2+ on the right side and 2+ on the left side.     Heart sounds: Normal heart sounds. No murmur.  Pulmonary:     Effort: Pulmonary effort is normal. No respiratory distress.     Breath sounds: Normal breath sounds. No wheezing, rhonchi or rales.  Abdominal:     General: Abdomen is flat. Bowel sounds are normal. There is no distension.     Palpations: Abdomen is soft. There is no mass.     Tenderness: There is no abdominal tenderness. There is no guarding or rebound.     Hernia: No hernia is present.  Musculoskeletal:        General: Normal range of motion.     Cervical back: Normal range of motion and neck supple.     Right lower leg: No edema.     Left lower leg: No edema.  Lymphadenopathy:     Cervical: No cervical adenopathy.  Skin:    General: Skin is warm and dry.      Findings: No rash.  Neurological:     General: No focal deficit present.     Mental Status: He is alert and oriented to person, place, and time.     Comments:  Alert and oriented to April 2021 (25th) Doesn't remember current president  CN grossly intact, station and gait intact Recall 0/3, with cue 1/3 - he denies any noted memory trouble Calculation 5/5 DLROW  Psychiatric:        Mood and Affect: Mood normal.        Behavior: Behavior normal.        Thought Content: Thought content normal.        Judgment: Judgment normal.       Results for orders placed or performed in visit on 03/26/19  HM DIABETES EYE EXAM  Result Value Ref Range   HM Diabetic Eye Exam No Retinopathy No Retinopathy   Assessment & Plan:  This visit occurred during the SARS-CoV-2 public health emergency.  Safety protocols were in place, including screening questions prior to the visit, additional usage of staff PPE, and extensive cleaning of exam room while observing appropriate contact time as indicated for disinfecting solutions.   Problem List Items Addressed This Visit    Severe obesity (BMI 35.0-39.9) with comorbidity ([place])   Pulmonary nodule less than 6 cm determined by computed tomography of lung    Rpt CT due 10/2019      Medicare annual wellness visit, subsequent - Primary    I have personally reviewed the Medicare Annual Wellness questionnaire and have noted 1. The patient's medical and social history 2. Their use of alcohol, tobacco or illicit drugs 3. Their current medications and supplements 4. The patient's functional ability including ADL's, fall risks, home safety risks and hearing or visual impairment. Cognitive function has been assessed and addressed as indicated.  5. Diet and physical activity 6. Evidence for depression or mood disorders The patients weight, height, BMI have been recorded in the chart. I  have made referrals, counseling and provided education to the patient based on  review of the above and I have provided the pt with a written personalized care plan for preventive services. Provider list updated.. See scanned questionairre as needed for further documentation. Reviewed preventative protocols and updated unless pt declined.       HTN (hypertension)    Stable period on current regimen - continue      Health maintenance examination    Preventative protocols reviewed and updated unless pt declined. Discussed healthy diet and lifestyle.       Fatty liver disease, nonalcoholic    NAFLD by [place]. Previously on tricor. Update LFTs      Extramammary Paget's disease    S/p excision of perineal lesion 12/2018.  Appreciate derm care. Stable period.       Relevant Orders   CBC with Differential/Platelet   Elevated PSA    Update PSA. Followed by Andreas Newport Junious Silk)      Relevant Orders   PSA, Total with Reflex to PSA, Free   Dyslipidemia associated with type 2 diabetes mellitus (HCC)    Chronic, on lipitor. Update FLP.  The ASCVD Risk score Mikey Bussing DC Jr., et al., 2013) failed to calculate for the following reasons:   The valid total cholesterol range is 130 to 320 mg/dL       Relevant Orders   Lipid panel   Comprehensive metabolic panel   Controlled diabetes mellitus type 2 with complications (HCC)    Chronic, stable. Update labs on metformin.      Relevant Orders   Hemoglobin A1c   Chronic midline low back pain   Centrilobular emphysema (HCC)    Asxs.       CAD (coronary artery disease), native coronary artery    Appreciate cards care - continue aspirin, statin.      Atherosclerosis of aorta [facility])   Advanced care planning/counseling discussion    Advanced directive discussion -scanned and in chart 12/2016.Does not want prolonged life support if terminal condition. No HCPOA form filled out. Would want wife Juliann Pulse then daughter Bennett Scrape to be HCPOA.           No orders of the defined types were placed in this encounter.  Orders  Placed This Encounter  Procedures  . Lipid panel  . Comprehensive metabolic panel  . Hemoglobin A1c  . CBC with Differential/Platelet  . PSA, Total with Reflex to PSA, Free    Patient instructions: Let us know dates of Pfizer vaccines and we will update your chart.  Labs today.  Good to see you today.   Return as needed or in 6 months for diabetes follow up visit.   Follow up plan: Return in about 6 months (around 11/26/2019), or if symptoms worsen or fail to improve, for follow up visit.  Ria Bush, MD

## 2019-05-27 NOTE — Assessment & Plan Note (Signed)
Preventative protocols reviewed and updated unless pt declined. Discussed healthy diet and lifestyle.  

## 2019-05-27 NOTE — Patient Instructions (Addendum)
Let us know dates of Pfizer vaccines and we will update your chart.  Labs today.  Good to see you today.   Return as needed or in 6 months for diabetes follow up visit.   Health Maintenance After Age 71 After age 61, you are at a higher risk for certain long-term diseases and infections as well as injuries from falls. Falls are a major cause of broken bones and head injuries in people who are older than age 46. Getting regular preventive care can help to keep you healthy and well. Preventive care includes getting regular testing and making lifestyle changes as recommended by your health care provider. Talk with your health care provider about:  Which screenings and tests you should have. A screening is a test that checks for a disease when you have no symptoms.  A diet and exercise plan that is right for you. What should I know about screenings and tests to prevent falls? Screening and testing are the best ways to find a health problem early. Early diagnosis and treatment give you the best chance of managing medical conditions that are common after age 64. Certain conditions and lifestyle choices may make you more likely to have a fall. Your health care provider may recommend:  Regular vision checks. Poor vision and conditions such as cataracts can make you more likely to have a fall. If you wear glasses, make sure to get your prescription updated if your vision changes.  Medicine review. Work with your health care provider to regularly review all of the medicines you are taking, including over-the-counter medicines. Ask your health care provider about any side effects that may make you more likely to have a fall. Tell your health care provider if any medicines that you take make you feel dizzy or sleepy.  Osteoporosis screening. Osteoporosis is a condition that causes the bones to get weaker. This can make the bones weak and cause them to break more easily.  Blood pressure screening. Blood  pressure changes and medicines to control blood pressure can make you feel dizzy.  Strength and balance checks. Your health care provider may recommend certain tests to check your strength and balance while standing, walking, or changing positions.  Foot health exam. Foot pain and numbness, as well as not wearing proper footwear, can make you more likely to have a fall.  Depression screening. You may be more likely to have a fall if you have a fear of falling, feel emotionally low, or feel unable to do activities that you used to do.  Alcohol use screening. Using too much alcohol can affect your balance and may make you more likely to have a fall. What actions can I take to lower my risk of falls? General instructions  Talk with your health care provider about your risks for falling. Tell your health care provider if: ? You fall. Be sure to tell your health care provider about all falls, even ones that seem minor. ? You feel dizzy, sleepy, or off-balance.  Take over-the-counter and prescription medicines only as told by your health care provider. These include any supplements.  Eat a healthy diet and maintain a healthy weight. A healthy diet includes low-fat dairy products, low-fat (lean) meats, and fiber from whole grains, beans, and lots of fruits and vegetables. Home safety  Remove any tripping hazards, such as rugs, cords, and clutter.  Install safety equipment such as grab bars in bathrooms and safety rails on stairs.  Keep rooms and walkways well-lit.  Activity   Follow a regular exercise program to stay fit. This will help you maintain your balance. Ask your health care provider what types of exercise are appropriate for you.  If you need a cane or walker, use it as recommended by your health care provider.  Wear supportive shoes that have nonskid soles. Lifestyle  Do not drink alcohol if your health care provider tells you not to drink.  If you drink alcohol, limit how  much you have: ? 0-1 drink a day for women. ? 0-2 drinks a day for men.  Be aware of how much alcohol is in your drink. In the [place] one drink equals one typical bottle of beer (12 oz), one-half glass of wine (5 oz), or one shot of hard liquor (1 oz).  Do not use any products that contain nicotine or tobacco, such as cigarettes and e-cigarettes. If you need help quitting, ask your health care provider. Summary  Having a healthy lifestyle and getting preventive care can help to protect your health and wellness after age 62.  Screening and testing are the best way to find a health problem early and help you avoid having a fall. Early diagnosis and treatment give you the best chance for managing medical conditions that are more common for people who are older than age 79.  Falls are a major cause of broken bones and head injuries in people who are older than age 41. Take precautions to prevent a fall at home.  Work with your health care provider to learn what changes you can make to improve your health and wellness and to prevent falls. This information is not intended to replace advice given to you by your health care provider. Make sure you discuss any questions you have with your health care provider. Document Revised: 05/08/2018 Document Reviewed: 11/28/2016 Elsevier Patient Education  2020 [employer].

## 2019-05-27 NOTE — Assessment & Plan Note (Signed)
Asxs.  ?

## 2019-05-27 NOTE — Assessment & Plan Note (Signed)
Stable period on current regimen - continue.  

## 2019-05-27 NOTE — Assessment & Plan Note (Signed)
Rpt CT due 10/2019

## 2019-05-27 NOTE — Assessment & Plan Note (Signed)
Advanced directive discussion -scanned and in chart 12/2016.Does not want prolonged life support if terminal condition. No HCPOA form filled out. Would want wife Casey Golden then daughter Casey Golden to be HCPOA.

## 2019-05-27 NOTE — Assessment & Plan Note (Signed)
Appreciate cards care - continue aspirin, statin.  

## 2019-05-27 NOTE — Assessment & Plan Note (Signed)
Update PSA. Followed by Andreas Newport Junious Silk)

## 2019-05-27 NOTE — Assessment & Plan Note (Signed)
Chronic, on lipitor. Update FLP.  The ASCVD Risk score Mikey Bussing DC Jr., et al., 2013) failed to calculate for the following reasons:   The valid total cholesterol range is 130 to 320 mg/dL

## 2019-05-27 NOTE — Assessment & Plan Note (Signed)
S/p excision of perineal lesion 12/2018.  Appreciate derm care. Stable period.

## 2019-05-28 LAB — CBC WITH DIFFERENTIAL/PLATELET
Basophils Absolute: 0.1 10*3/uL (ref 0.0–0.1)
Basophils Relative: 0.9 % (ref 0.0–3.0)
Eosinophils Absolute: 0.3 10*3/uL (ref 0.0–0.7)
Eosinophils Relative: 3.7 % (ref 0.0–5.0)
HCT: 47.3 % (ref 39.0–52.0)
Hemoglobin: 16.3 g/dL (ref 13.0–17.0)
Lymphocytes Relative: 38.4 % (ref 12.0–46.0)
Lymphs Abs: 3 10*3/uL (ref 0.7–4.0)
MCHC: 34.4 g/dL (ref 30.0–36.0)
MCV: 96.5 fl (ref 78.0–100.0)
Monocytes Absolute: 0.6 10*3/uL (ref 0.1–1.0)
Monocytes Relative: 7.8 % (ref 3.0–12.0)
Neutro Abs: 3.8 10*3/uL (ref 1.4–7.7)
Neutrophils Relative %: 49.2 % (ref 43.0–77.0)
Platelets: 244 10*3/uL (ref 150.0–400.0)
RBC: 4.9 Mil/uL (ref 4.22–5.81)
RDW: 12.6 % (ref 11.5–15.5)
WBC: 7.8 10*3/uL (ref 4.0–10.5)

## 2019-05-29 LAB — PSA, TOTAL WITH REFLEX TO PSA, FREE: PSA, Total: 4.5 ng/mL — ABNORMAL HIGH (ref <=4.0)

## 2019-05-29 LAB — REFLEX PSA, FREE
PSA, % Free: 29 % (calc) (ref >25)
PSA, Free: 1.3 ng/mL

## 2019-05-31 ENCOUNTER — Other Ambulatory Visit: Payer: Self-pay | Admitting: Family Medicine

## 2019-05-31 DIAGNOSIS — E781 Pure hyperglyceridemia: Secondary | ICD-10-CM

## 2019-06-03 DIAGNOSIS — Z85828 Personal history of other malignant neoplasm of skin: Secondary | ICD-10-CM | POA: Diagnosis not present

## 2019-06-03 DIAGNOSIS — B078 Other viral warts: Secondary | ICD-10-CM | POA: Diagnosis not present

## 2019-06-03 DIAGNOSIS — D492 Neoplasm of unspecified behavior of bone, soft tissue, and skin: Secondary | ICD-10-CM | POA: Diagnosis not present

## 2019-06-03 DIAGNOSIS — C44719 Basal cell carcinoma of skin of left lower limb, including hip: Secondary | ICD-10-CM | POA: Diagnosis not present

## 2019-06-04 ENCOUNTER — Other Ambulatory Visit: Payer: Self-pay | Admitting: Family

## 2019-06-09 DIAGNOSIS — Z85828 Personal history of other malignant neoplasm of skin: Secondary | ICD-10-CM | POA: Diagnosis not present

## 2019-06-09 DIAGNOSIS — C44719 Basal cell carcinoma of skin of left lower limb, including hip: Secondary | ICD-10-CM | POA: Diagnosis not present

## 2019-06-10 ENCOUNTER — Telehealth: Payer: Self-pay

## 2019-06-10 NOTE — Telephone Encounter (Signed)
[employer] left v/m wanting to know if received the fax on 06/09/19 asking for order for diabetic supplies. If so please complete the form and fax to [phone]. If you did not received the form requesting diabetic supplies please call [phone].

## 2019-06-11 ENCOUNTER — Other Ambulatory Visit: Payer: Self-pay

## 2019-06-11 ENCOUNTER — Other Ambulatory Visit (INDEPENDENT_AMBULATORY_CARE_PROVIDER_SITE_OTHER): Payer: 59

## 2019-06-11 DIAGNOSIS — E781 Pure hyperglyceridemia: Secondary | ICD-10-CM

## 2019-06-11 LAB — TRIGLYCERIDES: Triglycerides: 406 mg/dL — ABNORMAL HIGH (ref 0.0–149.0)

## 2019-06-12 ENCOUNTER — Other Ambulatory Visit: Payer: 59

## 2019-06-12 ENCOUNTER — Other Ambulatory Visit: Payer: Self-pay | Admitting: Family Medicine

## 2019-06-12 MED ORDER — ATORVASTATIN CALCIUM 40 MG PO TABS: 40.0000 mg | ORAL_TABLET | Freq: Every day | ORAL | 3 refills | Status: DC

## 2019-06-12 NOTE — Telephone Encounter (Signed)
Called and asked for form to be re sent.  Received form and placed in Dr Synthia Innocent inbox for review.

## 2019-06-16 NOTE — Telephone Encounter (Signed)
Rose with [employer] left v/m wanting status of form faxed on 06/12/19 for diabetic supplies. Please complete form and fax to [phone].

## 2019-06-17 ENCOUNTER — Other Ambulatory Visit: Payer: Self-pay | Admitting: Family Medicine

## 2019-06-18 ENCOUNTER — Ambulatory Visit: Payer: Medicare Other | Admitting: Podiatry

## 2019-06-18 NOTE — Telephone Encounter (Signed)
Attempted to call Rose, with [employer] to let her know we did receive the form.  However, there busi hrs are 8:30-5:00 (PST).  I'll try again later, around 11:30 (EST).

## 2019-06-18 NOTE — Telephone Encounter (Signed)
Spoke with Estill Bamberg of [employer] notifying her we received the fax and the provider will address it when he returns to the office.  Says she will make a note and call back next week to check on the progress.

## 2019-06-19 NOTE — Telephone Encounter (Signed)
Completed form faxed to [employer] at [phone].

## 2019-06-19 NOTE — Telephone Encounter (Signed)
Filled and in Lisa's box 

## 2019-07-01 ENCOUNTER — Telehealth: Payer: Self-pay | Admitting: Family Medicine

## 2019-07-01 ENCOUNTER — Other Ambulatory Visit: Payer: Self-pay | Admitting: Podiatry

## 2019-07-01 NOTE — Telephone Encounter (Signed)
Patient came in office wondering if you'd be willing to see his daughter as a new patient.

## 2019-07-01 NOTE — Telephone Encounter (Signed)
Ok to schedule in 30 min slot. Thanks.

## 2019-07-03 ENCOUNTER — Telehealth: Payer: Self-pay | Admitting: Family Medicine

## 2019-07-03 NOTE — Telephone Encounter (Signed)
Called and got patient's daughter scheduled for 30 min. New patient appointment.

## 2019-07-03 NOTE — Telephone Encounter (Signed)
Noted  

## 2019-07-06 ENCOUNTER — Other Ambulatory Visit: Payer: Self-pay | Admitting: Family Medicine

## 2019-07-06 MED ORDER — METFORMIN HCL 1000 MG PO TABS: 1000.0000 mg | ORAL_TABLET | Freq: Two times a day (BID) | ORAL | 3 refills | Status: DC

## 2019-07-06 MED ORDER — ATORVASTATIN CALCIUM 40 MG PO TABS: 40.0000 mg | ORAL_TABLET | Freq: Every day | ORAL | 3 refills | Status: DC

## 2019-07-06 NOTE — Telephone Encounter (Signed)
Gabapentin 300 mg Last rx [id number] Last OV:  05/27/19, AWV Next OV:  11/26/19, 6 mo DM f/u  Looks like Dr. Rockey Situ prescribes atenolol and losartan that pt is also requesting refills for.

## 2019-07-06 NOTE — Telephone Encounter (Signed)
Patient dropped off information for his medications.  Patient changed to [employer]. Patient dropped off information for his refills.  It's in the rx tower. Patient's almost out of Atenolol. Patient's requesting all of his medications be sent in.

## 2019-07-07 MED ORDER — GABAPENTIN 300 MG PO CAPS: 300.0000 mg | ORAL_CAPSULE | Freq: Three times a day (TID) | ORAL | 1 refills | Status: DC

## 2019-07-07 NOTE — Telephone Encounter (Signed)
Gabapentin refilled.  plz have him contact cards for other meds

## 2019-07-08 NOTE — Telephone Encounter (Signed)
Spoke with pt relaying Dr. Synthia Innocent message.  Pt verbalizes understanding and will contact Dr. Rockey Situ.

## 2019-08-03 IMAGING — CR CHEST - 2 VIEW
1 series · 3 of 3 positions shown · non-contrast
Comparison: September 05, 2017

CLINICAL DATA: Chest pain

EXAM:
CHEST - 2 VIEW

[Series 1: dg chest 2 view · 0.14mm/px · 3 of 3 slices shown]
[im 1/3]
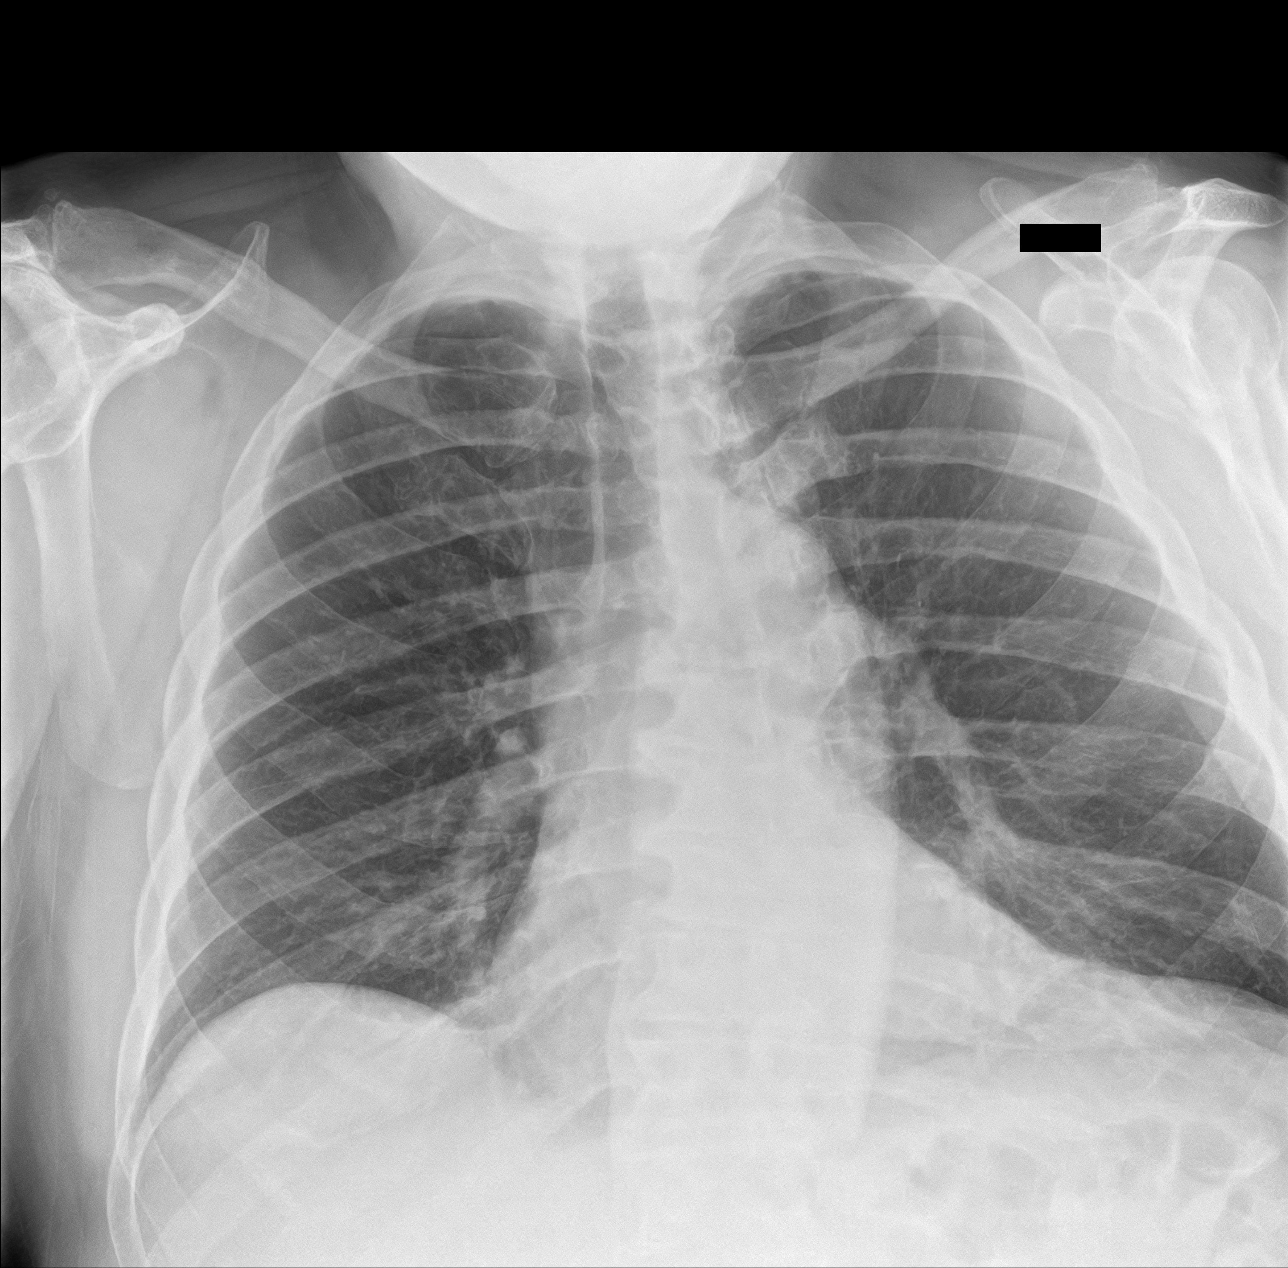
[im 2/3]
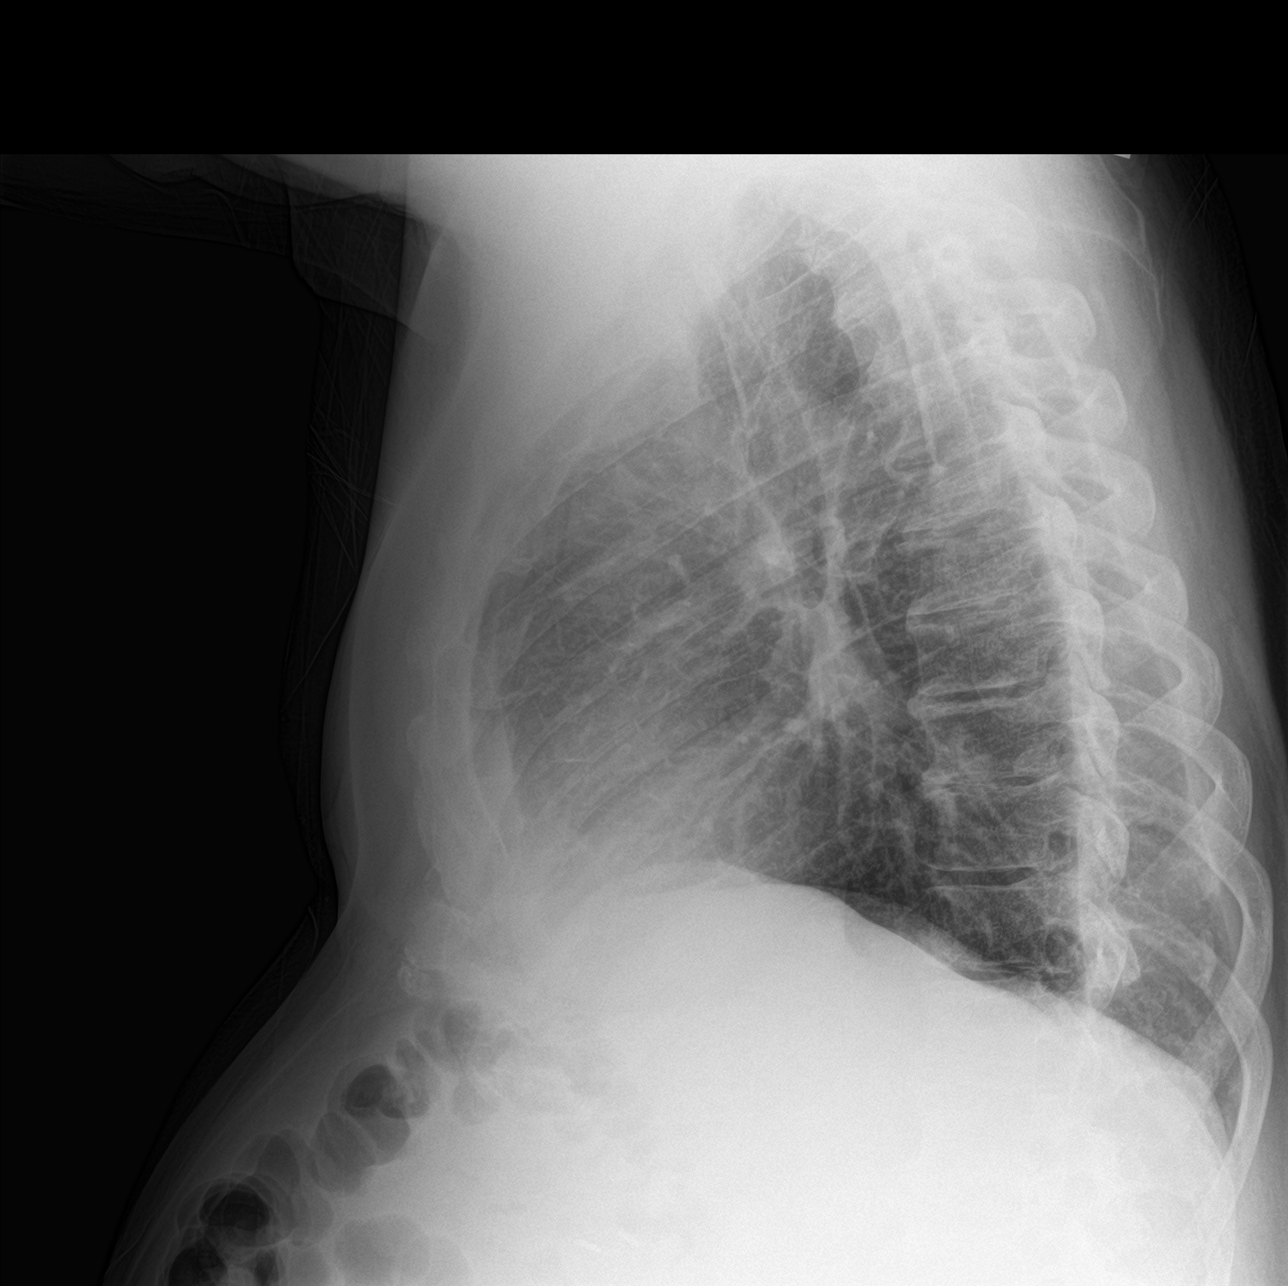
[im 3/3]
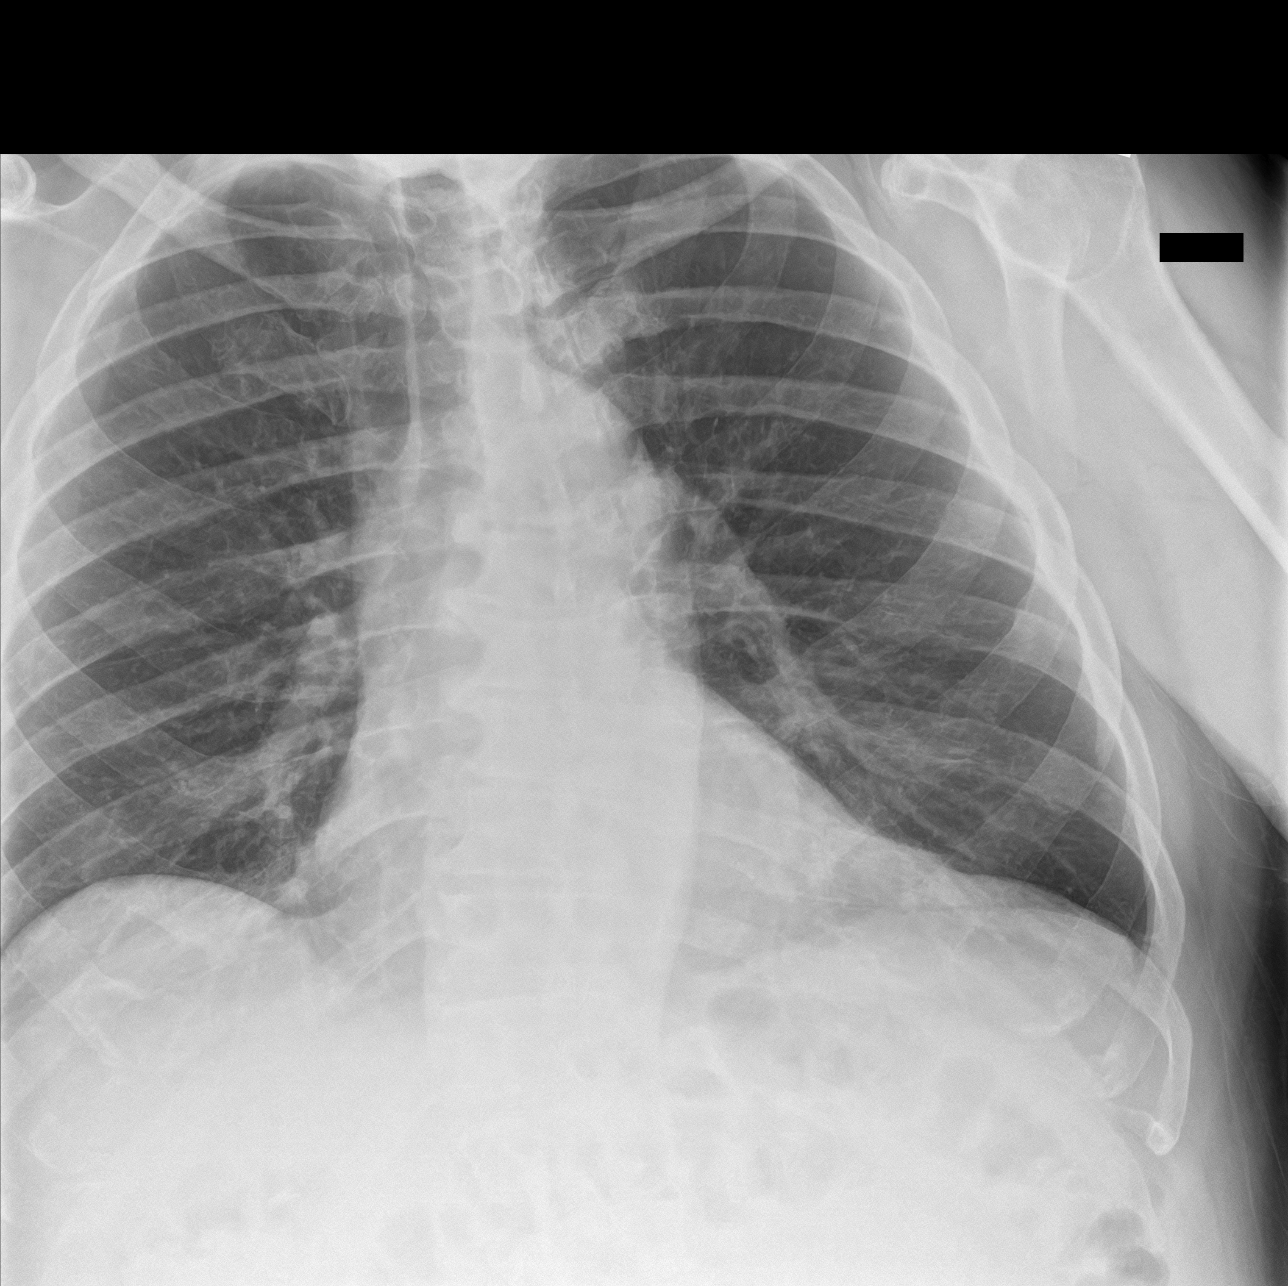

[3 of 3 positions shown; findings below may reference images not displayed]

FINDINGS: The heart size and mediastinal contours are within normal limits.
Both lungs are clear. The visualized skeletal structures are
unremarkable.
IMPRESSION: No active cardiopulmonary disease.

## 2019-09-01 ENCOUNTER — Telehealth: Payer: Self-pay

## 2019-09-01 DIAGNOSIS — Z1211 Encounter for screening for malignant neoplasm of colon: Secondary | ICD-10-CM

## 2019-09-01 NOTE — Telephone Encounter (Signed)
Pt was calling to confirm number for Dr Allen Norris with [facility] as he needs to scheduled colonoscopy... number [phone] was given to pt

## 2019-10-06 DIAGNOSIS — L570 Actinic keratosis: Secondary | ICD-10-CM | POA: Diagnosis not present

## 2019-11-15 IMAGING — CT CT ABD-PELV W/ CM
1 series · 14 of 32 positions shown, 18 images · IV contrast (OMNIPAQUE 300)
Comparison: Abdominal ultrasound 03/12/2013

CLINICAL DATA: Newly diagnosed extra mammary Paget's disease.
History basal cell skin cancer and melanoma.

EXAM:
CT CHEST, ABDOMEN, AND PELVIS WITH CONTRAST
TECHNIQUE: Multidetector CT imaging of the chest, abdomen and pelvis was
performed following the standard protocol during bolus
administration of intravenous contrast.
CONTRAST:  100mL OMNIPAQUE IOHEXOL 300 MG/ML  SOLN

[Series 7: abdomen delay · axial · delayed · 0.88mm/px · z∈[-453,-313]mm · 14 of 32 slices shown, 18 images]
[im 3/32  soft-tissue]
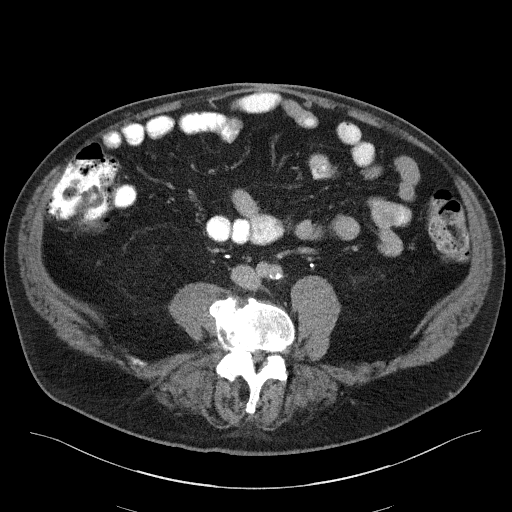
[im 3/32  bone]
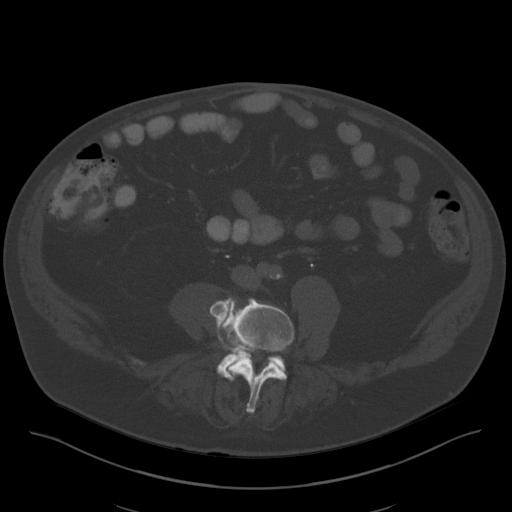
[im 5/32  soft-tissue]
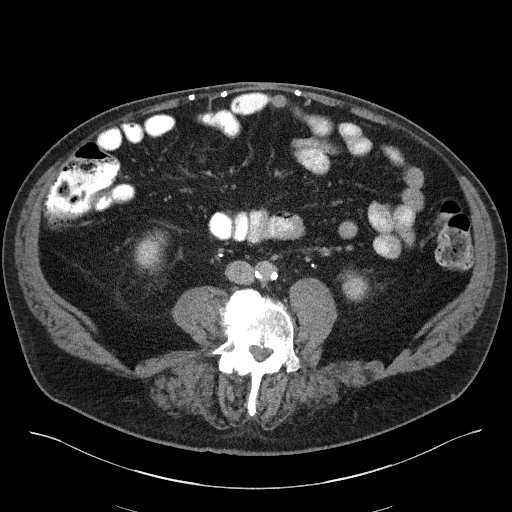
[im 8/32  soft-tissue]
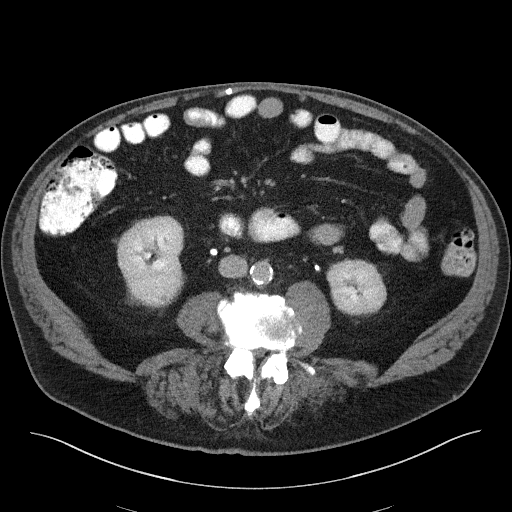
[im 10/32  soft-tissue]
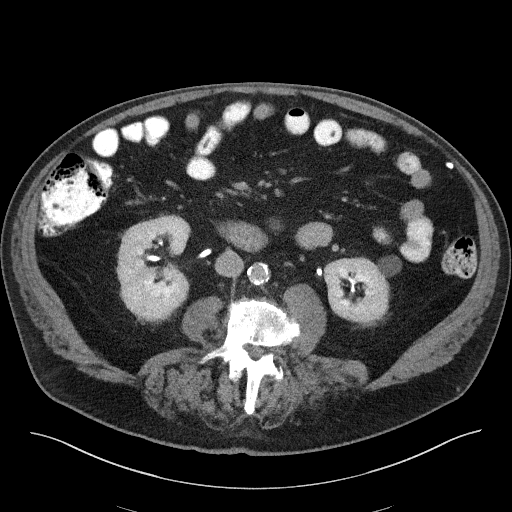
[im 13/32  soft-tissue]
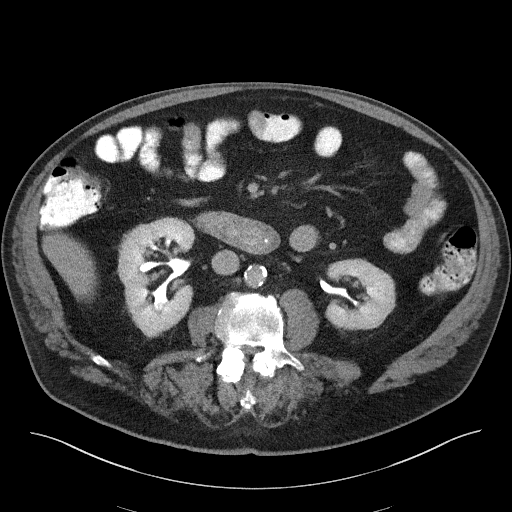
[im 15/32  soft-tissue]
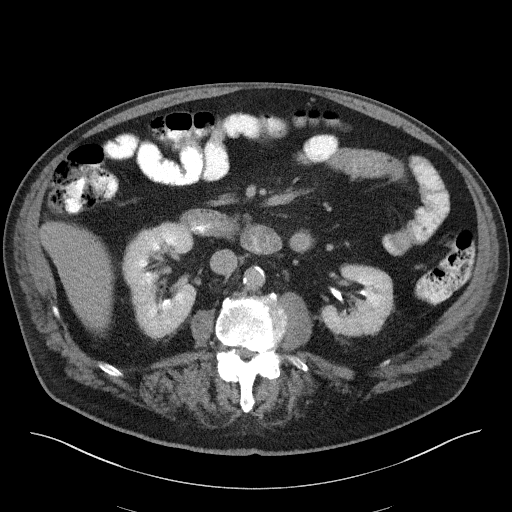
[im 18/32  soft-tissue]
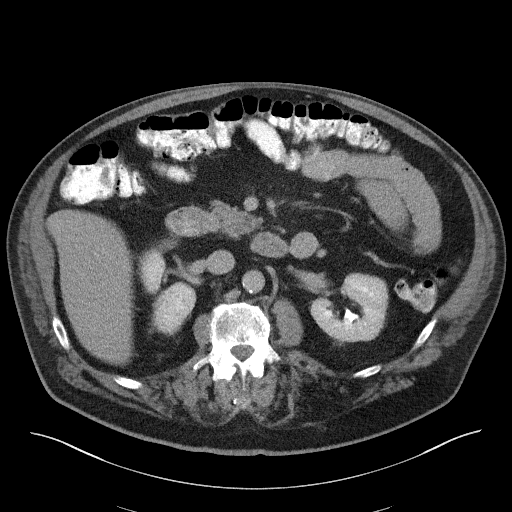
[im 20/32  soft-tissue]
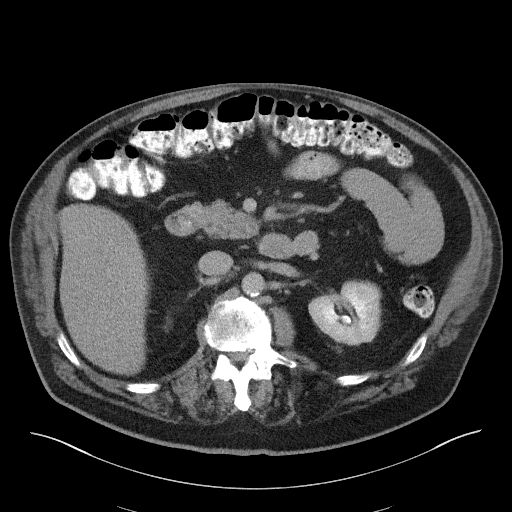
[im 23/32  soft-tissue]
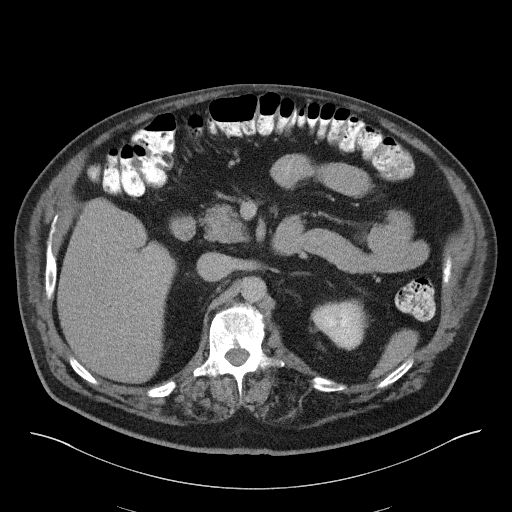
[im 23/32  bone]
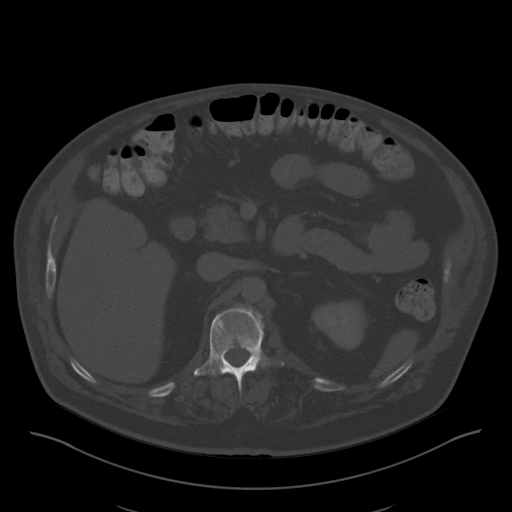
[im 25/32  soft-tissue]
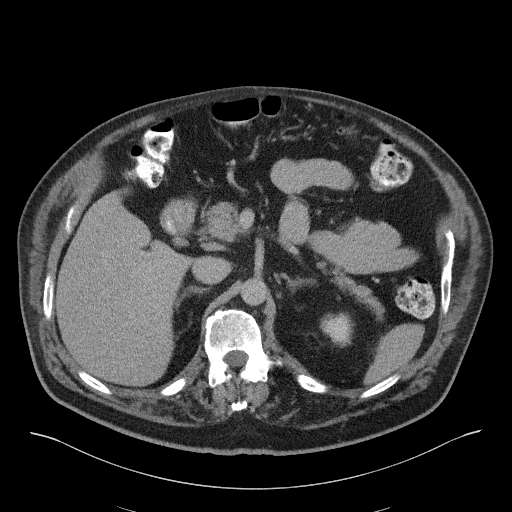
[im 28/32  soft-tissue]
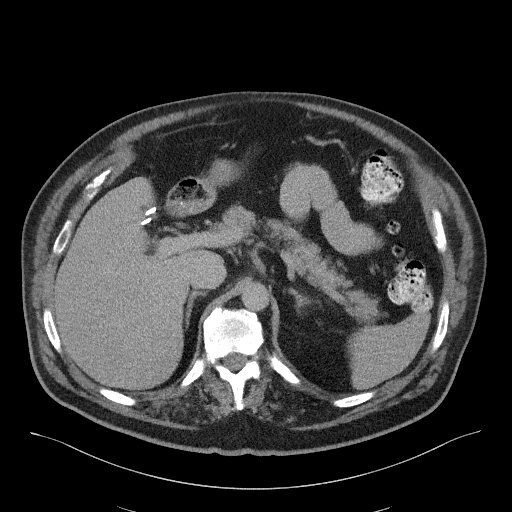
[im 28/32  lung]
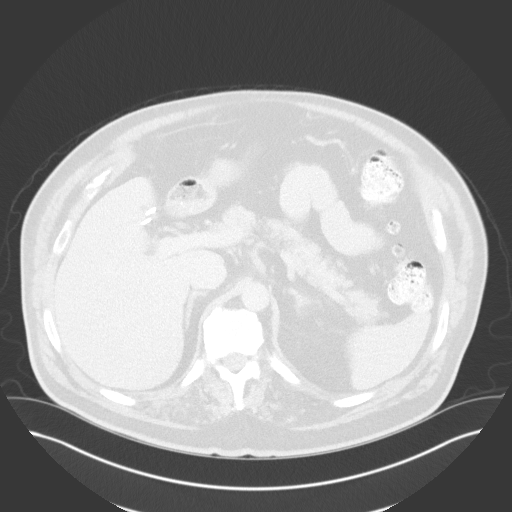
[im 29/32  lung]
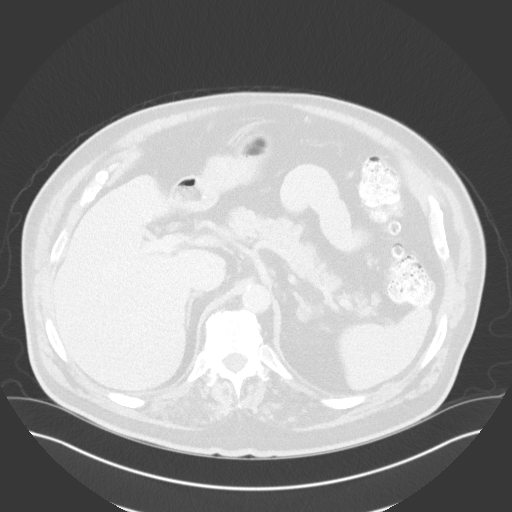
[im 30/32  soft-tissue]
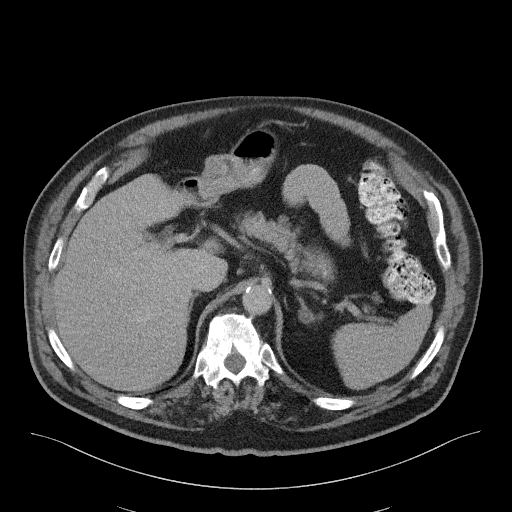
[im 30/32  lung]
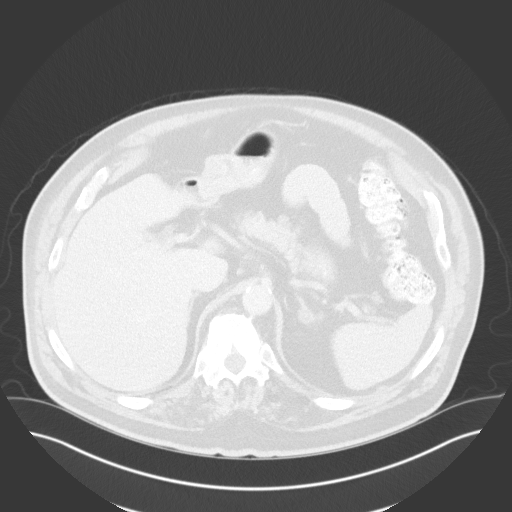
[im 31/32  lung]
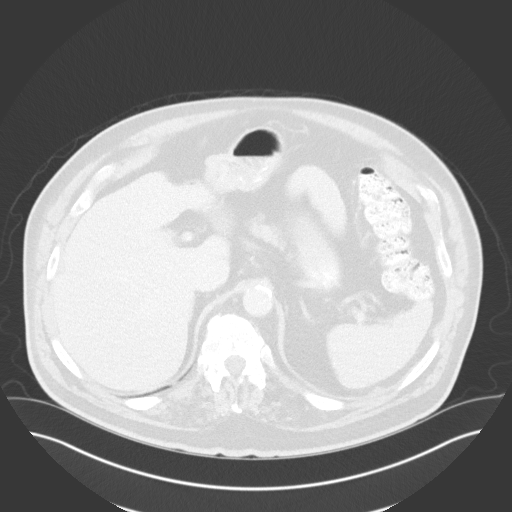

[14 of 32 positions shown; findings below may reference images not displayed]

FINDINGS: CT CHEST FINDINGS

Cardiovascular: Mild atherosclerosis of the aorta, great vessels and
coronary arteries. No acute vascular findings. The heart size is
normal. There is no pericardial effusion.

Mediastinum/Nodes: There are no enlarged mediastinal, hilar or
axillary lymph nodes. The thyroid gland, trachea and esophagus
demonstrate no significant findings.

Lungs/Pleura: There is no pleural effusion or pneumothorax. There is
mild centrilobular emphysema. In the left lower lobe, there is a 3
mm nodule on image 105/3. No suspicious pulmonary nodules.

Musculoskeletal/Chest wall: No chest wall mass or suspicious osseous
findings. No evidence of breast mass.

CT ABDOMEN AND PELVIS FINDINGS

Hepatobiliary: The liver is normal in density without suspicious
focal abnormality. No significant biliary dilatation post
cholecystectomy.

Pancreas: Unremarkable. No pancreatic ductal dilatation or
surrounding inflammatory changes.

Spleen: Normal in size without focal abnormality.

Adrenals/Urinary Tract: 14 mm left adrenal nodule on image 60/2 has
a density 4 HU, consistent with a small adenoma. The right adrenal
gland appears normal. Small low-density renal lesions are present,
consistent with cysts, largest in the lower pole of the left kidney
measuring 19 mm on image 79/2. No evidence of solid renal mass,
urinary tract calculus or hydronephrosis. The bladder appears
normal.

Stomach/Bowel: No evidence of bowel wall thickening, distention or
surrounding inflammatory change. The appendix is surgically absent
by report. There are mild sigmoid colon diverticular changes.

Vascular/Lymphatic: There are no enlarged abdominal or pelvic lymph
nodes. Aortic and branch vessel atherosclerosis. No acute vascular
findings.

Reproductive: Minimal enlargement of the prostate gland without
focal abnormality. The seminal vesicles appear normal.

Other: Postsurgical changes in the anterior abdominal wall
suggesting previous hernia repair. No recurrent hernia, abdominal
wall mass or ascites.

Musculoskeletal: No acute or significant osseous findings. There are
degenerative changes throughout the spine associated with a
thoracolumbar scoliosis. There is osseous foraminal narrowing in the
lower lumbar spine, especially on the right at L4-5 and to the left
at L5-S1.
IMPRESSION: 1. No evidence of primary malignancy or metastatic disease within
the chest, abdomen or pelvis.
2. Tiny left lower lobe pulmonary nodule. This appearance is almost
certainly benign, and no dedicated follow-up is required if this
patient is low risk for bronchogenic carcinoma (and has no known or
suspected primary neoplasm). Non-contrast chest CT can be considered
in 12 months if patient is high-risk. This recommendation follows
the consensus statement: Guidelines for Management of Incidental
Pulmonary Nodules Detected on CT Images: From the [HOSPITAL]
3. Small left adrenal adenoma, mild sigmoid diverticulosis and
lumbar spondylosis.
4. Aortic Atherosclerosis (H48T1-IB3.3).

## 2019-11-16 ENCOUNTER — Telehealth: Payer: Self-pay | Admitting: Family Medicine

## 2019-11-16 NOTE — Telephone Encounter (Signed)
Filled and in Lisa's box. Recommend he only use when in back pain flare.

## 2019-11-16 NOTE — Telephone Encounter (Signed)
Patient came into office and is requesting handicap placard to assist with back pain. Please advise if possible.

## 2019-11-16 NOTE — Telephone Encounter (Signed)
Patient came into office and is asking for assistance with scheduling appointment for colonoscopy, as he has not been able to schedule one. Please advise to assist with scheduling.

## 2019-11-17 NOTE — Telephone Encounter (Addendum)
Lvm asking pt to call back.  Need to notify pt the parking placard form is ready to pick and per Dr. Darnell Level, it only to be used when in back pain flare.   [Placed form at front office- yellow folders.]

## 2019-11-17 NOTE — Telephone Encounter (Signed)
Lvm asking pt to call back.  Dr. Darnell Level, pt may need referral for colonoscopy.

## 2019-11-17 NOTE — Addendum Note (Signed)
Addended by: Ria Bush on: 11/17/2019 02:05 PM   Modules accepted: Orders

## 2019-11-17 NOTE — Telephone Encounter (Signed)
New referral placed.

## 2019-11-18 NOTE — Telephone Encounter (Signed)
Lvm asking pt to call back.  Need to let pt know Dr. Darnell Level placed referral for colonoscopy.  Pt should be able to call [facility] at [phone] to schedule appt.

## 2019-11-18 NOTE — Telephone Encounter (Signed)
Lvm asking pt to call back.  Need to notify pt the parking placard form is ready to pick and per Dr. Darnell Level, it only to be used when in back pain flare.   [Placed form at front office- yellow folders.]

## 2019-11-19 ENCOUNTER — Telehealth: Payer: Self-pay

## 2019-11-19 NOTE — Telephone Encounter (Signed)
Spoke with pt encouraging him to contact Dr. Rockey Situ (cards), since that is who is prescribing med.  Pt says he will.

## 2019-11-19 NOTE — Telephone Encounter (Signed)
Spoke with pt relaying message.  Pt states he was able to call and schedule appt yesterday.

## 2019-11-19 NOTE — Telephone Encounter (Signed)
Patient has ran out of ATENOLOL 25MG  tablet. Wants to know if the doctor wants him to continue or not?

## 2019-11-19 NOTE — Telephone Encounter (Signed)
Spoke with pt notifying him form is ready to pick up and relaying Dr. Synthia Innocent message.  Pt verbalizes understanding and expresses his thanks.

## 2019-11-25 ENCOUNTER — Ambulatory Visit (INDEPENDENT_AMBULATORY_CARE_PROVIDER_SITE_OTHER): Payer: Medicare Other | Admitting: Family Medicine

## 2019-11-25 ENCOUNTER — Encounter: Payer: Self-pay | Admitting: Family Medicine

## 2019-11-25 ENCOUNTER — Other Ambulatory Visit: Payer: Self-pay

## 2019-11-25 VITALS — BP 140/76 | HR 58 | Temp 97.8°F | Ht 68.25 in | Wt 248.1 lb

## 2019-11-25 DIAGNOSIS — E118 Type 2 diabetes mellitus with unspecified complications: Secondary | ICD-10-CM

## 2019-11-25 DIAGNOSIS — C4499 Other specified malignant neoplasm of skin, unspecified: Secondary | ICD-10-CM

## 2019-11-25 DIAGNOSIS — E785 Hyperlipidemia, unspecified: Secondary | ICD-10-CM | POA: Diagnosis not present

## 2019-11-25 DIAGNOSIS — E1169 Type 2 diabetes mellitus with other specified complication: Secondary | ICD-10-CM | POA: Diagnosis not present

## 2019-11-25 DIAGNOSIS — R972 Elevated prostate specific antigen [PSA]: Secondary | ICD-10-CM | POA: Diagnosis not present

## 2019-11-25 LAB — BASIC METABOLIC PANEL
BUN: 21 mg/dL (ref 6–23)
CO2: 32 mEq/L (ref 19–32)
Calcium: 9.3 mg/dL (ref 8.4–10.5)
Chloride: 100 mEq/L (ref 96–112)
Creatinine, Ser: 0.81 mg/dL (ref 0.40–1.50)
GFR: 88.67 mL/min (ref >60.00)
Glucose, Bld: 105 mg/dL — ABNORMAL HIGH (ref 70–99)
Potassium: 4.3 mEq/L (ref 3.5–5.1)
Sodium: 137 mEq/L (ref 135–145)

## 2019-11-25 LAB — POCT GLYCOSYLATED HEMOGLOBIN (HGB A1C): Hemoglobin A1C: 5.8 % — AB (ref 4.0–5.6)

## 2019-11-25 LAB — LIPID PANEL
Cholesterol: 95 mg/dL (ref 0–200)
HDL: 31.2 mg/dL — ABNORMAL LOW (ref >39.00)
NonHDL: 64.19
Total CHOL/HDL Ratio: 3
Triglycerides: 223 mg/dL — ABNORMAL HIGH (ref 0.0–149.0)
VLDL: 44.6 mg/dL — ABNORMAL HIGH (ref 0.0–40.0)

## 2019-11-25 LAB — LDL CHOLESTEROL, DIRECT: Direct LDL: 42 mg/dL

## 2019-11-25 NOTE — Assessment & Plan Note (Signed)
Encouraged renewed efforts for sustainable weight loss.

## 2019-11-25 NOTE — Progress Notes (Signed)
This visit was conducted in person.  BP 140/76 (BP Location: Left Arm, Patient Position: Sitting, Cuff Size: Large)   Pulse (!) 58   Temp 97.8 F (36.6 C) (Temporal)   Ht 5' 8.25" (1.734 m)   Wt 248 lb 1 oz (112.5 kg)   SpO2 98%   BMI 37.44 kg/m    CC: 6 mo DM f/u visit  Subjective:    Patient ID: Casey Golden, male    DOB: 12-16-1950, 69 y.o.   MRN: [id number]  HPI: Casey Golden is a 69 y.o. male presenting on 11/25/2019 for Diabetes (Here for 6 mo f/u.)   Now working part time - but 40 hr work weeks as work is short staffed.  [employer] s/p excision of inguinal crease lesion 12/2018. Sees derm regularly.  Weight gain noted - 10 lbs up in 6 months.  Due for colonoscopy - in process of getting this scheduled.   Has questions about COVID booster.  Planning trip to [place] in 2 wks  DM - does not regularly check sugars, last check 2 wks ago and normal. Compliant with antihyperglycemic regimen which includes: metformin 1089m bid. Denies low sugars or hypoglycemic symptoms. Activity limited by back pain. Infrequent paresthesias. Last diabetic eye exam 03/2019. Pneumovax: 03/2018. Prevnar: 08/2015. Glucometer brand: generic. DSME: 10/2017 started.  Lab Results  Component Value Date   HGBA1C 5.8 (A) 11/25/2019   Diabetic Foot Exam - Simple   Simple Foot Form Diabetic Foot exam was performed with the following findings: Yes 11/25/2019  9:27 AM  Visual Inspection No deformities, no ulcerations, no other skin breakdown bilaterally: Yes Sensation Testing Intact to touch and monofilament testing bilaterally: Yes Pulse Check See comments: Yes Comments Diminished pulses bilaterally    Lab Results  Component Value Date   MICROALBUR <0.7 10/25/2016         Relevant past medical, surgical, family and social history reviewed and updated as indicated. Interim medical history since our last visit reviewed. Allergies and medications reviewed and updated. Outpatient Medications  Prior to Visit  Medication Sig Dispense Refill  . albuterol (PROVENTIL HFA;VENTOLIN HFA) 108 (90 Base) MCG/ACT inhaler INHALE 2 PUFFS BY MOUTH EVERY 6 HOURS AS NEEDED FOR WHEEZING 8.5 Inhaler 2  . aspirin 81 MG EC tablet Take 1 tablet (81 mg total) by mouth daily. 30 tablet 12  . atenolol (TENORMIN) 25 MG tablet TAKE 1 TABLET BY MOUTH EVERY DAY 90 tablet 3  . atorvastatin (LIPITOR) 40 MG tablet Take 1 tablet (40 mg total) by mouth daily. 90 tablet 3  . Blood Glucose Monitoring Suppl (BLOOD GLUCOSE MONITOR SYSTEM) w/Device KIT 1 each by Does not apply route daily. Use as directed to check blood sugar once daily 1 each 0  . diclofenac sodium (VOLTAREN) 1 % GEL APPLY 2 G TOPICALLY 3 (THREE) TIMES DAILY AS NEEDED (ANTI INFLAMMATORY). 100 g 1  . gabapentin (NEURONTIN) 300 MG capsule Take 1 capsule (300 mg total) by mouth 3 (three) times daily. 270 capsule 1  . Glucose Blood (BLOOD GLUCOSE TEST STRIPS) STRP 1 each by In Vitro route daily. Use as directed to check blood sugar once daily 100 each 0  . ibuprofen (ADVIL,MOTRIN) 200 MG tablet Take 400-600 mg by mouth every 6 (six) hours as needed for mild pain (DEPENDS ON PAIN IF TAKES 400-600 MG).     . Lancets MISC 1 each by Does not apply route daily. Use as directed to check blood sugar daily 100 each 0  .  losartan (COZAAR) 50 MG tablet TAKE 1 TABLET BY MOUTH 2 (TWO) TIMES DAILY. PLEASE CALL TO SCHEDULE OFFICE VISIT FOR MORE REFILLS 180 tablet 3  . metFORMIN (GLUCOPHAGE) 1000 MG tablet Take 1 tablet (1,000 mg total) by mouth 2 (two) times daily with a meal. 180 tablet 3  . nitroGLYCERIN (NITROSTAT) 0.4 MG SL tablet Place 1 tablet (0.4 mg total) under the tongue every 5 (five) minutes as needed for chest pain (do not take more than 3 doses.). 75 tablet 1  . doxycycline (VIBRAMYCIN) 100 MG capsule Take 1 capsule (100 mg total) by mouth 2 (two) times daily. 20 capsule 0  . gabapentin (NEURONTIN) 100 MG capsule Take 100 mg by mouth at bedtime. As needed      No facility-administered medications prior to visit.     Per HPI unless specifically indicated in ROS section below Review of Systems Objective:  BP 140/76 (BP Location: Left Arm, Patient Position: Sitting, Cuff Size: Large)   Pulse (!) 58   Temp 97.8 F (36.6 C) (Temporal)   Ht 5' 8.25" (1.734 m)   Wt 248 lb 1 oz (112.5 kg)   SpO2 98%   BMI 37.44 kg/m   Wt Readings from Last 3 Encounters:  11/25/19 248 lb 1 oz (112.5 kg)  05/27/19 237 lb 2 oz (107.6 kg)  04/06/19 241 lb (109.3 kg)      Physical Exam Vitals and nursing note reviewed.  Constitutional:      General: He is not in acute distress.    Appearance: He is well-developed.  HENT:     Head: Normocephalic and atraumatic.  Eyes:     General: No scleral icterus.    Extraocular Movements: Extraocular movements intact.     Conjunctiva/sclera: Conjunctivae normal.     Pupils: Pupils are equal, round, and reactive to light.  Cardiovascular:     Rate and Rhythm: Normal rate and regular rhythm.     Pulses: Normal pulses.     Heart sounds: Normal heart sounds. No murmur heard.   Pulmonary:     Effort: Pulmonary effort is normal. No respiratory distress.     Breath sounds: Normal breath sounds. No wheezing, rhonchi or rales.  Musculoskeletal:     Cervical back: Normal range of motion and neck supple.     Right lower leg: No edema.     Left lower leg: No edema.     Comments: See HPI for foot exam if done  Lymphadenopathy:     Cervical: No cervical adenopathy.  Skin:    General: Skin is warm and dry.     Findings: No rash.  Psychiatric:        Mood and Affect: Mood normal.        Behavior: Behavior normal.       Results for orders placed or performed in visit on 11/25/19  POCT glycosylated hemoglobin (Hb A1C)  Result Value Ref Range   Hemoglobin A1C 5.8 (A) 4.0 - 5.6 %   HbA1c POC (<> result, manual entry)     HbA1c, POC (prediabetic range)     HbA1c, POC (controlled diabetic range)     Lab Results   Component Value Date   PSA 4.49 (H) 05/22/2018   PSA 6.81 (H) 03/21/2017   PSA 2.69 03/07/2016   Assessment & Plan:  This visit occurred during the SARS-CoV-2 public health emergency.  Safety protocols were in place, including screening questions prior to the visit, additional usage of staff PPE, and extensive cleaning  of exam room while observing appropriate contact time as indicated for disinfecting solutions.   Problem List Items Addressed This Visit    Severe obesity (BMI 35.0-39.9) with comorbidity ([place])    Encouraged renewed efforts for sustainable weight loss.       Extramammary Paget's disease    S/p excision 12/2018. Regularly sees derm.       Elevated PSA    Continue seeing uro for active surveillance Q51moplanned f/u 12/2019.       Dyslipidemia associated with type 2 diabetes mellitus ([place]    Update FLP in h/o elevated triglycerides - consider adding fibrate vs increasing statin dose.  The 10-year ASCVD risk score (Mikey BussingDC JBrooke Bonito, et al., 2013) is: 47.9%   Values used to calculate the score:     Age: [age]years     Sex: Male     Is Non-Hispanic African American: No     Diabetic: Yes     Tobacco smoker: No     Systolic Blood Pressure: 1185mmHg     Is BP treated: Yes     HDL Cholesterol: 32.3 mg/dL     Total Cholesterol: 176 mg/dL       Relevant Orders   Basic metabolic panel   Lipid panel   Controlled diabetes mellitus type 2 with complications (HCC) - Primary    Chronic, great control on current regimen.  Continue full dose metformin - tolerating well.  Encouraged renewed efforts at weight loss.       Relevant Orders   POCT glycosylated hemoglobin (Hb A1C) (Completed)   Basic metabolic panel       No orders of the defined types were placed in this encounter.  Orders Placed This Encounter  Procedures  . Basic metabolic panel  . Lipid panel  . POCT glycosylated hemoglobin (Hb A1C)    Patient Instructions  Labs today  Consider COVID booster prior to  upcoming [place] trip.  Good to see you today, return as needed or in 6 months for next wellness visit.  Watch sugars during holiday season.    Follow up plan: Return in about 6 months (around 05/25/2020) for annual exam, prior fasting for blood work.  JRia Bush MD

## 2019-11-25 NOTE — Assessment & Plan Note (Signed)
Continue seeing uro for active surveillance Q52mo planned f/u 12/2019.

## 2019-11-25 NOTE — Assessment & Plan Note (Signed)
Update FLP in h/o elevated triglycerides - consider adding fibrate vs increasing statin dose.  The 10-year ASCVD risk score Casey Bussing DC Brooke Bonito., et al., 2013) is: 47.9%   Values used to calculate the score:     Age: 68 years     Sex: Male     Is Non-Hispanic African American: No     Diabetic: Yes     Tobacco smoker: No     Systolic Blood Pressure: 553 mmHg     Is BP treated: Yes     HDL Cholesterol: 32.3 mg/dL     Total Cholesterol: 176 mg/dL

## 2019-11-25 NOTE — Assessment & Plan Note (Addendum)
Chronic, great control on current regimen.  Continue full dose metformin - tolerating well.  Encouraged renewed efforts at weight loss.

## 2019-11-25 NOTE — Patient Instructions (Addendum)
Labs today  Consider COVID booster prior to upcoming [place] trip.  Good to see you today, return as needed or in 6 months for next wellness visit.  Watch sugars during holiday season.

## 2019-11-25 NOTE — Assessment & Plan Note (Signed)
S/p excision 12/2018. Regularly sees derm.

## 2019-11-26 ENCOUNTER — Ambulatory Visit: Payer: 59 | Admitting: Family Medicine

## 2019-11-26 ENCOUNTER — Telehealth (INDEPENDENT_AMBULATORY_CARE_PROVIDER_SITE_OTHER): Payer: Self-pay | Admitting: Gastroenterology

## 2019-11-26 ENCOUNTER — Other Ambulatory Visit: Payer: Self-pay

## 2019-11-26 DIAGNOSIS — Z86010 Personal history of colonic polyps: Secondary | ICD-10-CM

## 2019-11-26 MED ORDER — NA SULFATE-K SULFATE-MG SULF 17.5-3.13-1.6 GM/177ML PO SOLN
1.0000 | Freq: Once | ORAL | 0 refills | Status: AC
Start: 2019-11-26 — End: 2019-11-26

## 2019-11-26 NOTE — Progress Notes (Signed)
Gastroenterology Pre-Procedure Review  Request Date: Tuesday 12/07 Requesting Physician: Dr. Allen Norris  PATIENT REVIEW QUESTIONS: The patient responded to the following health history questions as indicated:    1. Are you having any GI issues? yes (occasional constipation) 2. Do you have a personal history of Polyps? yes (08/07/16 colonoscopy performed by Dr. Allen Norris colon polyps were noted) 3. Do you have a family history of Colon Cancer or Polyps? no 4. Diabetes Mellitus? no 5. Joint replacements in the past 12 months?no 6. Major health problems in the past 3 months?skin cancer removed in May 7. Any artificial heart valves, MVP, or defibrillator?no    MEDICATIONS & ALLERGIES:    Patient reports the following regarding taking any anticoagulation/antiplatelet therapy:   Plavix, Coumadin, Eliquis, Xarelto, Lovenox, Pradaxa, Brilinta, or Effient? no Aspirin? 81 mg daily  Patient confirms/reports the following medications:  Current Outpatient Medications  Medication Sig Dispense Refill  . albuterol (PROVENTIL HFA;VENTOLIN HFA) 108 (90 Base) MCG/ACT inhaler INHALE 2 PUFFS BY MOUTH EVERY 6 HOURS AS NEEDED FOR WHEEZING 8.5 Inhaler 2  . aspirin 81 MG EC tablet Take 1 tablet (81 mg total) by mouth daily. 30 tablet 12  . atenolol (TENORMIN) 25 MG tablet TAKE 1 TABLET BY MOUTH EVERY DAY 90 tablet 3  . atorvastatin (LIPITOR) 40 MG tablet Take 1 tablet (40 mg total) by mouth daily. 90 tablet 3  . Blood Glucose Monitoring Suppl (BLOOD GLUCOSE MONITOR SYSTEM) w/Device KIT 1 each by Does not apply route daily. Use as directed to check blood sugar once daily 1 each 0  . diclofenac sodium (VOLTAREN) 1 % GEL APPLY 2 G TOPICALLY 3 (THREE) TIMES DAILY AS NEEDED (ANTI INFLAMMATORY). 100 g 1  . gabapentin (NEURONTIN) 300 MG capsule Take 1 capsule (300 mg total) by mouth 3 (three) times daily. 270 capsule 1  . Glucose Blood (BLOOD GLUCOSE TEST STRIPS) STRP 1 each by In Vitro route daily. Use as directed to check  blood sugar once daily 100 each 0  . ibuprofen (ADVIL,MOTRIN) 200 MG tablet Take 400-600 mg by mouth every 6 (six) hours as needed for mild pain (DEPENDS ON PAIN IF TAKES 400-600 MG).     . Lancets MISC 1 each by Does not apply route daily. Use as directed to check blood sugar daily 100 each 0  . losartan (COZAAR) 50 MG tablet TAKE 1 TABLET BY MOUTH 2 (TWO) TIMES DAILY. PLEASE CALL TO SCHEDULE OFFICE VISIT FOR MORE REFILLS 180 tablet 3  . metFORMIN (GLUCOPHAGE) 1000 MG tablet Take 1 tablet (1,000 mg total) by mouth 2 (two) times daily with a meal. 180 tablet 3  . Na Sulfate-K Sulfate-Mg Sulf 17.5-3.13-1.6 GM/177ML SOLN Take 1 kit by mouth once for 1 dose. 354 mL 0  . nitroGLYCERIN (NITROSTAT) 0.4 MG SL tablet Place 1 tablet (0.4 mg total) under the tongue every 5 (five) minutes as needed for chest pain (do not take more than 3 doses.). 75 tablet 1   No current facility-administered medications for this visit.    Patient confirms/reports the following allergies:  Allergies  Allergen Reactions  . Sulfa Antibiotics Hives  . Sulfur     No orders of the defined types were placed in this encounter.   AUTHORIZATION INFORMATION Primary Insurance: 1D#: Group #:  Secondary Insurance: 1D#: Group #:  SCHEDULE INFORMATION: Date: Tuesday 01/05/20 Time: Location:[facility]

## 2019-12-03 ENCOUNTER — Telehealth: Payer: Self-pay

## 2019-12-03 MED ORDER — VASCEPA 0.5 G PO CAPS: 2.0000 | ORAL_CAPSULE | Freq: Two times a day (BID) | ORAL | 6 refills | Status: DC

## 2019-12-03 NOTE — Telephone Encounter (Signed)
plz notify vascepa sent to pharmacy to price out.

## 2019-12-03 NOTE — Telephone Encounter (Signed)
Pt said he got my chart message about lab results and pt would like to try the Vascepa; please send to [facility] [address] in [place] and please let pt know the price of the med.

## 2019-12-04 NOTE — Telephone Encounter (Signed)
Spoke with pt relaying Dr. Synthia Innocent message.  Pt verbalizes understanding.  Also, he mentioned getting COVID booster shot on 11/29/19 at [facility].  Shot has been documented.

## 2019-12-14 DIAGNOSIS — L821 Other seborrheic keratosis: Secondary | ICD-10-CM | POA: Diagnosis not present

## 2019-12-14 DIAGNOSIS — C4499 Other specified malignant neoplasm of skin, unspecified: Secondary | ICD-10-CM | POA: Diagnosis not present

## 2019-12-14 DIAGNOSIS — L570 Actinic keratosis: Secondary | ICD-10-CM | POA: Diagnosis not present

## 2019-12-14 DIAGNOSIS — Z85828 Personal history of other malignant neoplasm of skin: Secondary | ICD-10-CM | POA: Diagnosis not present

## 2019-12-14 DIAGNOSIS — D1801 Hemangioma of skin and subcutaneous tissue: Secondary | ICD-10-CM | POA: Diagnosis not present

## 2019-12-17 ENCOUNTER — Other Ambulatory Visit: Payer: Self-pay | Admitting: Family Medicine

## 2019-12-17 NOTE — Telephone Encounter (Signed)
Gabapentin Last filled:  10/22/19, #270/1 Last OV:  11/25/19, 6 mo DM f/u Next OV:  none

## 2019-12-27 NOTE — Progress Notes (Signed)
PATIENT: Casey Golden DOB: Apr 13, 1950  REASON FOR VISIT: follow up HISTORY FROM: patient  HISTORY OF PRESENT ILLNESS: Today 12/27/19:  Casey Golden is a 69 year old male with a history of obstructive sleep apnea on CPAP.  He returns today for follow-up.  Download indicates that he use his machine 27 out of 30 days for compliance of 90%.  He uses machine greater than 4 hours 16 days for compliance of 53%.  On average he uses his machine 5 hours and 7 minutes.  His residual AHI is 4.5 on 7 cm of water with EPR of 3.  Reports that the CPAP works well for him.  He denies any new issues.  Returns today for follow-up.  HISTORY 12/22/2018: I reviewed his CPAP compliance data From 11/18/2018 through 12/17/2018 which is a total of 30 days, during which time he used his machine 28 days with percent use days greater than 4 hours at 80%, indicating very good compliance with an average usage of 5 hours and 24 minutes, residual AHI borderline at 5.2/h, leak on the high side with a 95th percentile at 21.1 L/min on a pressure of 7 cm with EPR of 3. He reports generally doing okay with his CPAP but sometimes the mask is uncomfortable as he had a skin cancer removed from the upper left lip area.  He has been seeing other specialists, he has a lesion in his groin that needs removing.  In the process of working this up he had a CT of his chest, abdomen and pelvis, he was told he had a spot on the lung which needs monitoring.  He is up-to-date with his CPAP related supplies and motivated to continue with treatment, he continues to benefit from it.  REVIEW OF SYSTEMS: Out of a complete 14 system review of symptoms, the patient complains only of the following symptoms, and all other reviewed systems are negative.  FSS ESS  ALLERGIES: Allergies  Allergen Reactions  . Sulfa Antibiotics Hives  . Sulfur     HOME MEDICATIONS: Outpatient Medications Prior to Visit  Medication Sig Dispense Refill  . albuterol  (PROVENTIL HFA;VENTOLIN HFA) 108 (90 Base) MCG/ACT inhaler INHALE 2 PUFFS BY MOUTH EVERY 6 HOURS AS NEEDED FOR WHEEZING 8.5 Inhaler 2  . aspirin 81 MG EC tablet Take 1 tablet (81 mg total) by mouth daily. 30 tablet 12  . atenolol (TENORMIN) 25 MG tablet TAKE 1 TABLET BY MOUTH EVERY DAY 90 tablet 3  . atorvastatin (LIPITOR) 40 MG tablet Take 1 tablet (40 mg total) by mouth daily. 90 tablet 3  . Blood Glucose Monitoring Suppl (BLOOD GLUCOSE MONITOR SYSTEM) w/Device KIT 1 each by Does not apply route daily. Use as directed to check blood sugar once daily 1 each 0  . diclofenac sodium (VOLTAREN) 1 % GEL APPLY 2 G TOPICALLY 3 (THREE) TIMES DAILY AS NEEDED (ANTI INFLAMMATORY). 100 g 1  . gabapentin (NEURONTIN) 300 MG capsule TAKE 1 CAPSULE BY MOUTH 3  TIMES DAILY 270 capsule 1  . Glucose Blood (BLOOD GLUCOSE TEST STRIPS) STRP 1 each by In Vitro route daily. Use as directed to check blood sugar once daily 100 each 0  . ibuprofen (ADVIL,MOTRIN) 200 MG tablet Take 400-600 mg by mouth every 6 (six) hours as needed for mild pain (DEPENDS ON PAIN IF TAKES 400-600 MG).     Casey Golden Ethyl (VASCEPA) 0.5 g CAPS Take 2 capsules by mouth 2 (two) times daily. 120 capsule 6  . Lancets MISC  1 each by Does not apply route daily. Use as directed to check blood sugar daily 100 each 0  . losartan (COZAAR) 50 MG tablet TAKE 1 TABLET BY MOUTH 2 (TWO) TIMES DAILY. PLEASE CALL TO SCHEDULE OFFICE VISIT FOR MORE REFILLS 180 tablet 3  . metFORMIN (GLUCOPHAGE) 1000 MG tablet Take 1 tablet (1,000 mg total) by mouth 2 (two) times daily with a meal. 180 tablet 3  . nitroGLYCERIN (NITROSTAT) 0.4 MG SL tablet Place 1 tablet (0.4 mg total) under the tongue every 5 (five) minutes as needed for chest pain (do not take more than 3 doses.). 75 tablet 1   No facility-administered medications prior to visit.    PAST MEDICAL HISTORY: Past Medical History:  Diagnosis Date  . Arthritis   . CAD (coronary artery disease)    cathx3 with  nonobstructive disease. Last cath with RCA 40% stenosis in 2009  . Cancer ([place])   . Cataract 2019   bilateral; resolved with surgery  . Colonic polyp   . Fatty liver disease, nonalcoholic 8119   by [place]  . GERD (gastroesophageal reflux disease)   . History of chicken pox   . Hyperlipidemia   . Hypertension   . Nocturia   . OSA (obstructive sleep apnea)    CPAP, compliant  . Paget's disease of bony pelvis   . Past use of tobacco    Quit 1990, 70 pack year history  . Rash of genital area    09-08-2014  per pt Dr Casey Golden aware  . Seasonal and perennial allergic rhinitis   . Type 2 diabetes mellitus ([place])   . Wears dentures    full upper/  partial lower  . Wears glasses     PAST SURGICAL HISTORY: Past Surgical History:  Procedure Laterality Date  . ABDOMINAL HERNIA REPAIR  2007      [facility]   open repair  . APPENDECTOMY  age 62  . CARDIAC CATHETERIZATION  12-23-2007   [facility]   Abnormal myoview w/ ischemia/  40% mRCA with nonobstructive and no sig. plaque in his left system, EF 55%  . CARDIAC CATHETERIZATION  Apr 2008    [facility]   Abnormal myoview/  50% RCA,  ef 65%  . CARDIAC CATHETERIZATION  1999      [facility]  . CATARACT EXTRACTION, BILATERAL Bilateral 11/2017  . CERVICAL FUSION  1992  . CHOLECYSTECTOMY OPEN  2006  . COLONOSCOPY WITH PROPOFOL N/A 04/17/2016   TAs, high grade dysplasia with margins clear, diverticulosis Casey Lame, MD)  . COLONOSCOPY WITH PROPOFOL N/A 08/07/2016   TAx1, diverticulosis, rpt 3 yrs (Casey Golden)  . DENTAL SURGERY     metal dental implant L mandible  . EXCISION OF SKIN TAG Right 09/14/2014   Procedure: EXCISION OF SKIN TAG;  Surgeon: Casey Aloe, MD;  Location[facility];  Service: Urology;  Laterality: Right;  . GROIN MASS OPEN BIOPSY Left 01/17/2019  . HYDROCELE EXCISION Left 09/14/2014   Procedure: LEFT HYDROCELECTOMY ADULT;  Surgeon: Casey Aloe, MD;  Location: [facility];  Service: Urology;  Laterality: Left;  .  MOHS SURGERY  2015   skin cancer  . TONSILLECTOMY  age 22    FAMILY HISTORY: Family History  Problem Relation Age of Onset  . CAD Mother        MI  . Diabetes Mother   . Stroke Mother        mini-stroke  . Cancer Father 60       lung (  smoker)  . Heart disease Paternal Grandmother   . Heart disease Paternal Grandfather   . Diabetes Paternal Grandfather   . Cancer Sister        lung  . Coronary artery disease Neg Hx        Premature    SOCIAL HISTORY: Social History   Socioeconomic History  . Marital status: Married    Spouse name: Not on file  . Number of children: Not on file  . Years of education: Not on file  . Highest education level: Not on file  Occupational History  . Occupation: Full time    Employer: [employer]  Tobacco Use  . Smoking status: Former Smoker    Packs/day: 2.00    Years: 35.00    Pack years: 70.00    Types: Cigarettes    Quit date: 01/29/1990    Years since quitting: 29.9  . Smokeless tobacco: Never Used  Vaping Use  . Vaping Use: Never used  Substance and Sexual Activity  . Alcohol use: No  . Drug use: No  . Sexual activity: Yes  Other Topics Concern  . Not on file  Social History Narrative   Lives with wife, dog and cats    Occupation: retired, was self employed, now works at [employer]    Edu: HS   Activity: walks 1.5 mi daily   Diet: some water, fruits/vegetables daily   Social Determinants of Radio broadcast assistant Strain:   . Difficulty of Paying Living Expenses: Not on file  Food Insecurity:   . Worried About Charity fundraiser in the Last Year: Not on file  . Ran Out of Food in the Last Year: Not on file  Transportation Needs:   . Lack of Transportation (Medical): Not on file  . Lack of Transportation (Non-Medical): Not on file  Physical Activity:   . Days of Exercise per Week: Not on file  . Minutes of Exercise per Session: Not on file  Stress:   . Feeling of Stress : Not on file  Social Connections:    . Frequency of Communication with Friends and Family: Not on file  . Frequency of Social Gatherings with Friends and Family: Not on file  . Attends Religious Services: Not on file  . Active Member of Clubs or Organizations: Not on file  . Attends Archivist Meetings: Not on file  . Marital Status: Not on file  Intimate Partner Violence:   . Fear of Current or Ex-Partner: Not on file  . Emotionally Abused: Not on file  . Physically Abused: Not on file  . Sexually Abused: Not on file      PHYSICAL EXAM  Vitals:   12/28/19 1119  BP: 126/78  Pulse: 78  Weight: 248 lb 12.8 oz (112.9 kg)  Height: 5' 8.25" (1.734 m)   Body mass index is 37.55 kg/m.  Generalized: Well developed, in no acute distress  Chest: Lungs clear to auscultation bilaterally  Neurological examination  Mentation: Alert oriented to time, place, history taking. Follows all commands speech and language fluent Cranial nerve II-XII: Extraocular movements were full, visual field were full on confrontational test Head turning and shoulder shrug  were normal and symmetric. Motor: The motor testing reveals 5 over 5 strength of all 4 extremities. Good symmetric motor tone is noted throughout.  Sensory: Sensory testing is intact to soft touch on all 4 extremities. No evidence of extinction is noted.  Gait and station: Gait is normal.  DIAGNOSTIC DATA (LABS, IMAGING, TESTING) - I reviewed patient records, labs, notes, testing and imaging myself where available.  Lab Results  Component Value Date   WBC 7.8 05/27/2019   HGB 16.3 05/27/2019   HCT 47.3 05/27/2019   MCV 96.5 05/27/2019   PLT 244.0 05/27/2019      Component Value Date/Time   NA 137 11/25/2019 0936   NA 139 07/21/2014 0935   NA 140 12/05/2013 0904   K 4.3 11/25/2019 0936   K 4.3 12/05/2013 0904   CL 100 11/25/2019 0936   CL 101 12/05/2013 0904   CO2 32 11/25/2019 0936   CO2 32 12/05/2013 0904   GLUCOSE 105 (H) 11/25/2019 0936    GLUCOSE 157 (H) 12/05/2013 0904   BUN 21 11/25/2019 0936   BUN 13 07/21/2014 0935   BUN 19 (H) 12/05/2013 0904   CREATININE 0.81 11/25/2019 0936   CREATININE 1.29 12/05/2013 0904   CALCIUM 9.3 11/25/2019 0936   CALCIUM 9.2 12/05/2013 0904   PROT 7.5 05/27/2019 1100   PROT 7.2 07/21/2014 0935   PROT 7.3 12/05/2013 0904   ALBUMIN 4.3 05/27/2019 1100   ALBUMIN 4.4 07/21/2014 0935   ALBUMIN 3.7 12/05/2013 0904   AST 37 05/27/2019 1100   AST 35 12/05/2013 0904   ALT 39 05/27/2019 1100   ALT 55 12/05/2013 0904   ALKPHOS 67 05/27/2019 1100   ALKPHOS 60 12/05/2013 0904   BILITOT 0.8 05/27/2019 1100   BILITOT 0.8 07/21/2014 0935   BILITOT 0.6 12/05/2013 0904   GFRNONAA >60 01/17/2019 0224   GFRNONAA 59 (L) 12/05/2013 0904   GFRAA >60 01/17/2019 0224   GFRAA >60 12/05/2013 0904   Lab Results  Component Value Date   CHOL 95 11/25/2019   HDL 31.20 (L) 11/25/2019   LDLCALC 43 03/21/2017   LDLDIRECT 42.0 11/25/2019   TRIG 223.0 (H) 11/25/2019   CHOLHDL 3 11/25/2019   Lab Results  Component Value Date   HGBA1C 5.8 (A) 11/25/2019   Lab Results  Component Value Date   VITAMINB12 378 09/05/2015   No results found for: TSH    ASSESSMENT AND PLAN 69 y.o. year old male  has a past medical history of Arthritis, CAD (coronary artery disease), Cancer ([place]), Cataract (2019), Colonic polyp, Fatty liver disease, nonalcoholic (1779), GERD (gastroesophageal reflux disease), History of chicken pox, Hyperlipidemia, Hypertension, Nocturia, OSA (obstructive sleep apnea), Paget's disease of bony pelvis, Past use of tobacco, Rash of genital area, Seasonal and perennial allergic rhinitis, Type 2 diabetes mellitus ([place]), Wears dentures, and Wears glasses. here with:  1. OSA on CPAP  - CPAP compliance excellent - Good treatment of AHI  - Encourage patient to use CPAP nightly and > 4 hours each night - F/U in 1 year or sooner if needed   I spent 25 minutes of face-to-face and non-face-to-face  time with patient.  This included previsit chart review, lab review, study review, order entry, electronic health record documentation, patient education.  Ward Givens, MSN, NP-C 12/27/2019, 2:46 PM [facility] [address], [place], [place] [address] [phone]

## 2019-12-28 ENCOUNTER — Other Ambulatory Visit: Payer: Self-pay

## 2019-12-28 ENCOUNTER — Encounter: Payer: Self-pay | Admitting: Adult Health

## 2019-12-28 ENCOUNTER — Ambulatory Visit: Payer: Medicare Other | Admitting: Adult Health

## 2019-12-28 VITALS — BP 126/78 | HR 78 | Ht 68.25 in | Wt 248.8 lb

## 2019-12-28 DIAGNOSIS — Z9989 Dependence on other enabling machines and devices: Secondary | ICD-10-CM | POA: Diagnosis not present

## 2019-12-28 DIAGNOSIS — G4733 Obstructive sleep apnea (adult) (pediatric): Secondary | ICD-10-CM

## 2019-12-28 NOTE — Patient Instructions (Signed)
Continue using CPAP nightly and greater than 4 hours each night °If your symptoms worsen or you develop new symptoms please let us know.  ° °

## 2020-01-01 ENCOUNTER — Other Ambulatory Visit
Admission: RE | Admit: 2020-01-01 | Discharge: 2020-01-01 | Disposition: A | Discharge status: Home / Self Care | Payer: Medicare Other | Source: Ambulatory Visit | Attending: Gastroenterology | Admitting: Gastroenterology

## 2020-01-01 ENCOUNTER — Other Ambulatory Visit: Payer: Self-pay

## 2020-01-01 DIAGNOSIS — Z20822 Contact with and (suspected) exposure to covid-19: Secondary | ICD-10-CM | POA: Insufficient documentation

## 2020-01-01 DIAGNOSIS — Z01812 Encounter for preprocedural laboratory examination: Secondary | ICD-10-CM | POA: Diagnosis not present

## 2020-01-03 LAB — SARS CORONAVIRUS 2 (TAT 6-24 HRS): SARS Coronavirus 2: NEGATIVE

## 2020-01-04 ENCOUNTER — Encounter: Payer: Self-pay | Admitting: Gastroenterology

## 2020-01-05 ENCOUNTER — Other Ambulatory Visit: Payer: Self-pay

## 2020-01-05 ENCOUNTER — Encounter
Admission: RE | Disposition: A | Discharge status: Home / Self Care | Payer: Self-pay | Source: Home / Self Care | Attending: Gastroenterology

## 2020-01-05 ENCOUNTER — Encounter: Payer: Self-pay | Admitting: Gastroenterology

## 2020-01-05 ENCOUNTER — Ambulatory Visit: Payer: Medicare Other | Admitting: Anesthesiology

## 2020-01-05 ENCOUNTER — Ambulatory Visit
Admission: RE | Admit: 2020-01-05 | Discharge: 2020-01-05 | Disposition: A | Discharge status: Home / Self Care | Payer: Medicare Other | Attending: Gastroenterology | Admitting: Gastroenterology

## 2020-01-05 DIAGNOSIS — Z1211 Encounter for screening for malignant neoplasm of colon: Secondary | ICD-10-CM | POA: Diagnosis not present

## 2020-01-05 DIAGNOSIS — Z87891 Personal history of nicotine dependence: Secondary | ICD-10-CM | POA: Diagnosis not present

## 2020-01-05 DIAGNOSIS — K648 Other hemorrhoids: Secondary | ICD-10-CM | POA: Diagnosis not present

## 2020-01-05 DIAGNOSIS — Z09 Encounter for follow-up examination after completed treatment for conditions other than malignant neoplasm: Secondary | ICD-10-CM | POA: Insufficient documentation

## 2020-01-05 DIAGNOSIS — Z7984 Long term (current) use of oral hypoglycemic drugs: Secondary | ICD-10-CM | POA: Insufficient documentation

## 2020-01-05 DIAGNOSIS — D126 Benign neoplasm of colon, unspecified: Secondary | ICD-10-CM | POA: Diagnosis not present

## 2020-01-05 DIAGNOSIS — Z882 Allergy status to sulfonamides status: Secondary | ICD-10-CM | POA: Insufficient documentation

## 2020-01-05 DIAGNOSIS — Z86010 Personal history of colonic polyps: Secondary | ICD-10-CM | POA: Diagnosis not present

## 2020-01-05 DIAGNOSIS — Z881 Allergy status to other antibiotic agents status: Secondary | ICD-10-CM | POA: Insufficient documentation

## 2020-01-05 DIAGNOSIS — D124 Benign neoplasm of descending colon: Secondary | ICD-10-CM | POA: Insufficient documentation

## 2020-01-05 DIAGNOSIS — K635 Polyp of colon: Secondary | ICD-10-CM

## 2020-01-05 DIAGNOSIS — Z7982 Long term (current) use of aspirin: Secondary | ICD-10-CM | POA: Diagnosis not present

## 2020-01-05 DIAGNOSIS — E118 Type 2 diabetes mellitus with unspecified complications: Secondary | ICD-10-CM | POA: Diagnosis not present

## 2020-01-05 DIAGNOSIS — I10 Essential (primary) hypertension: Secondary | ICD-10-CM | POA: Diagnosis not present

## 2020-01-05 HISTORY — PX: COLONOSCOPY WITH PROPOFOL: SHX5780

## 2020-01-05 LAB — GLUCOSE, CAPILLARY: Glucose-Capillary: 136 mg/dL — ABNORMAL HIGH (ref 70–99)

## 2020-01-05 SURGERY — COLONOSCOPY WITH PROPOFOL
Anesthesia: General

## 2020-01-05 MED ORDER — PROPOFOL 500 MG/50ML IV EMUL
INTRAVENOUS | Status: DC | PRN
  Administered 2020-01-05: 140 ug/kg/min via INTRAVENOUS

## 2020-01-05 MED ORDER — SODIUM CHLORIDE 0.9 % IV SOLN: INTRAVENOUS | Status: DC

## 2020-01-05 MED ORDER — PROPOFOL 10 MG/ML IV BOLUS
INTRAVENOUS | Status: DC | PRN
  Administered 2020-01-05: 70 mg via INTRAVENOUS

## 2020-01-05 NOTE — Anesthesia Postprocedure Evaluation (Signed)
Anesthesia Post Note  Patient: Casey Golden  Procedure(s) Performed: COLONOSCOPY WITH PROPOFOL (N/A )  Patient location during evaluation: Endoscopy Anesthesia Type: General Level of consciousness: awake and alert Pain management: pain level controlled Vital Signs Assessment: post-procedure vital signs reviewed and stable Respiratory status: spontaneous breathing, nonlabored ventilation, respiratory function stable and patient connected to nasal cannula oxygen Cardiovascular status: blood pressure returned to baseline and stable Postop Assessment: no apparent nausea or vomiting Anesthetic complications: no   No complications documented.   Last Vitals:  Vitals:   01/05/20 1038 01/05/20 1048  BP: 134/72 136/66  Pulse: (!) 59 (!) 58  Resp: 13 15  Temp:    SpO2: 98% 100%    Last Pain:  Vitals:   01/05/20 1048  TempSrc:   PainSc: 0-No pain                 Precious Haws Adael Culbreath

## 2020-01-05 NOTE — Op Note (Signed)
[facility] Gastroenterology Patient Name: Casey Golden Procedure Date: 01/05/2020 9:49 AM MRN: [id number] Account #: [id number] Date of Birth: Sep 15, 1950 Admit Type: Outpatient Age: 69 Room: [facility] ENDO ROOM 4 Gender: Male Note Status: Finalized Procedure:             Colonoscopy Indications:           High risk colon cancer surveillance: Personal history                         of colonic polyps Providers:             Lucilla Lame MD, MD Medicines:             Propofol per Anesthesia Complications:         No immediate complications. Procedure:             Pre-Anesthesia Assessment:                        - Prior to the procedure, a History and Physical was                         performed, and patient medications and allergies were                         reviewed. The patient's tolerance of previous                         anesthesia was also reviewed. The risks and benefits                         of the procedure and the sedation options and risks                         were discussed with the patient. All questions were                         answered, and informed consent was obtained. Prior                         Anticoagulants: The patient has taken no previous                         anticoagulant or antiplatelet agents. ASA Grade                         Assessment: II - A patient with mild systemic disease.                         After reviewing the risks and benefits, the patient                         was deemed in satisfactory condition to undergo the                         procedure.                        After obtaining informed consent, the colonoscope was  passed under direct vision. Throughout the procedure,                         the patient's blood pressure, pulse, and oxygen                         saturations were monitored continuously. The                         Colonoscope was introduced through the anus and                          advanced to the the cecum, identified by appendiceal                         orifice and ileocecal valve. The colonoscopy was                         performed without difficulty. The patient tolerated                         the procedure well. The quality of the bowel                         preparation was excellent. Findings:      The perianal and digital rectal examinations were normal.      A 5 mm polyp was found in the transverse colon. The polyp was sessile.       The polyp was removed with a cold snare. Resection and retrieval were       complete.      Non-bleeding internal hemorrhoids were found during retroflexion. The       hemorrhoids were Grade I (internal hemorrhoids that do not prolapse). Impression:            - One 5 mm polyp in the transverse colon, removed with                         a cold snare. Resected and retrieved.                        - Non-bleeding internal hemorrhoids. Recommendation:        - Discharge patient to home.                        - Resume previous diet.                        - Continue present medications.                        - Await pathology results.                        - Repeat colonoscopy in 5 years for surveillance. Procedure Code(s):     --- Professional ---                        [phone], Colonoscopy, flexible; with removal of  tumor(s), polyp(s), or other lesion(s) by snare                         technique Diagnosis Code(s):     --- Professional ---                        Z86.010, Personal history of colonic polyps                        K63.5, Polyp of colon CPT copyright 2019 American Medical Association. All rights reserved. The codes documented in this report are preliminary and upon coder review may  be revised to meet current compliance requirements. Lucilla Lame MD, MD 01/05/2020 10:17:26 AM This report has been signed electronically. Number of Addenda: 0 Note Initiated  On: 01/05/2020 9:49 AM Scope Withdrawal Time: 0 hours 8 minutes 47 seconds  Total Procedure Duration: 0 hours 10 minutes 36 seconds  Estimated Blood Loss:  Estimated blood loss: none.      [facility]

## 2020-01-05 NOTE — H&P (Signed)
Lucilla Lame, MD [facility] [address] [place], [place] [address] [phone] Fax : [phone]  Primary Care Physician:  Ria Bush, MD Primary Gastroenterologist:  Dr. Allen Norris  Pre-Procedure History & Physical: HPI:  Casey Golden is a 69 y.o. male is here for an colonoscopy.   Past Medical History:  Diagnosis Date  . Arthritis   . CAD (coronary artery disease)    cathx3 with nonobstructive disease. Last cath with RCA 40% stenosis in 2009  . Cancer ([place])   . Cataract 2019   bilateral; resolved with surgery  . Colonic polyp   . Fatty liver disease, nonalcoholic 4818   by [place]  . GERD (gastroesophageal reflux disease)   . History of chicken pox   . Hyperlipidemia   . Hypertension   . Nocturia   . OSA (obstructive sleep apnea)    CPAP, compliant  . Paget's disease of bony pelvis   . Past use of tobacco    Quit 1990, 70 pack year history  . Rash of genital area    09-08-2014  per pt Dr Junious Silk aware  . Seasonal and perennial allergic rhinitis   . Type 2 diabetes mellitus ([place])   . Wears dentures    full upper/  partial lower  . Wears glasses     Past Surgical History:  Procedure Laterality Date  . ABDOMINAL HERNIA REPAIR  2007      [facility]   open repair  . APPENDECTOMY  age 56  . CARDIAC CATHETERIZATION  12-23-2007   [facility]   Abnormal myoview w/ ischemia/  40% mRCA with nonobstructive and no sig. plaque in his left system, EF 55%  . CARDIAC CATHETERIZATION  Apr 2008    [facility]   Abnormal myoview/  50% RCA,  ef 65%  . CARDIAC CATHETERIZATION  1999      [facility]  . CATARACT EXTRACTION, BILATERAL Bilateral 11/2017  . CERVICAL FUSION  1992  . CHOLECYSTECTOMY OPEN  2006  . COLONOSCOPY WITH PROPOFOL N/A 04/17/2016   TAs, high grade dysplasia with margins clear, diverticulosis Lucilla Lame, MD)  . COLONOSCOPY WITH PROPOFOL N/A 08/07/2016   TAx1, diverticulosis, rpt 3 yrs (Lana Flaim)  . DENTAL SURGERY     metal dental implant L mandible  . EXCISION OF SKIN TAG  Right 09/14/2014   Procedure: EXCISION OF SKIN TAG;  Surgeon: Festus Aloe, MD;  Location[facility];  Service: Urology;  Laterality: Right;  . GROIN MASS OPEN BIOPSY Left 01/17/2019  . HYDROCELE EXCISION Left 09/14/2014   Procedure: LEFT HYDROCELECTOMY ADULT;  Surgeon: Festus Aloe, MD;  [facility];  Service: Urology;  Laterality: Left;  . MOHS SURGERY  2015   skin cancer  . TONSILLECTOMY  age 2    Prior to Admission medications   Medication Sig Start Date End Date Taking? Authorizing Provider  aspirin 81 MG EC tablet Take 1 tablet (81 mg total) by mouth daily. 09/17/14  Yes Festus Aloe, MD  atenolol (TENORMIN) 25 MG tablet TAKE 1 TABLET BY MOUTH EVERY DAY 01/27/19  Yes Gollan, Kathlene November, MD  atorvastatin (LIPITOR) 40 MG tablet Take 1 tablet (40 mg total) by mouth daily. 07/06/19  Yes Ria Bush, MD  gabapentin (NEURONTIN) 300 MG capsule TAKE 1 CAPSULE BY MOUTH 3  TIMES DAILY 12/17/19  Yes Ria Bush, MD  metFORMIN (GLUCOPHAGE) 1000 MG tablet Take 1 tablet (1,000 mg total) by mouth 2 (two) times daily with a meal. 07/06/19  Yes Ria Bush, MD  Blood Glucose Monitoring Suppl (BLOOD GLUCOSE MONITOR SYSTEM) w/Device KIT 1 each by Does not apply route daily. Use as directed to check blood sugar once daily 07/24/17   Ria Bush, MD  diclofenac sodium (VOLTAREN) 1 % GEL APPLY 2 G TOPICALLY 3 (THREE) TIMES DAILY AS NEEDED (ANTI INFLAMMATORY). 07/24/17   Ria Bush, MD  Glucose Blood (BLOOD GLUCOSE TEST STRIPS) STRP 1 each by In Vitro route daily. Use as directed to check blood sugar once daily 07/24/17   Ria Bush, MD  ibuprofen (ADVIL,MOTRIN) 200 MG tablet Take 400-600 mg by mouth every 6 (six) hours as needed for mild pain (DEPENDS ON PAIN IF TAKES 400-600 MG).     [provider]  Lancets MISC 1 each by Does not apply route daily. Use as directed to check blood sugar daily 07/24/17   Ria Bush,  MD  nitroGLYCERIN (NITROSTAT) 0.4 MG SL tablet Place 1 tablet (0.4 mg total) under the tongue every 5 (five) minutes as needed for chest pain (do not take more than 3 doses.). 06/05/19 09/03/19  Loel Dubonnet, NP    Allergies as of 11/26/2019 - Review Complete 11/26/2019  Allergen Reaction Noted  . Sulfa antibiotics Hives 01/01/2013  . Sulfur  09/16/2018    Family History  Problem Relation Age of Onset  . CAD Mother        MI  . Diabetes Mother   . Stroke Mother        mini-stroke  . Cancer Father 61       lung (smoker)  . Heart disease Paternal Grandmother   . Heart disease Paternal Grandfather   . Diabetes Paternal Grandfather   . Cancer Sister        lung  . Coronary artery disease Neg Hx        Premature    Social History   Socioeconomic History  . Marital status: Married    Spouse name: Not on file  . Number of children: Not on file  . Years of education: Not on file  . Highest education level: Not on file  Occupational History  . Occupation: Full time    Employer: [employer]  Tobacco Use  . Smoking status: Former Smoker    Packs/day: 2.00    Years: 35.00    Pack years: 70.00    Types: Cigarettes    Quit date: 01/29/1990    Years since quitting: 29.9  . Smokeless tobacco: Never Used  Vaping Use  . Vaping Use: Never used  Substance and Sexual Activity  . Alcohol use: No  . Drug use: No  . Sexual activity: Yes  Other Topics Concern  . Not on file  Social History Narrative   Lives with wife, dog and cats    Occupation: retired, was self employed, now works at [employer]    Edu: HS   Activity: walks 1.5 mi daily   Diet: some water, fruits/vegetables daily   Social Determinants of Radio broadcast assistant Strain:   . Difficulty of Paying Living Expenses: Not on file  Food Insecurity:   . Worried About Charity fundraiser in the Last Year: Not on file  . Ran Out of Food in the Last Year: Not on file  Transportation Needs:   . Lack of  Transportation (Medical): Not on file  . Lack of Transportation (Non-Medical): Not on file  Physical Activity:   . Days of Exercise per Week: Not on file  . Minutes of Exercise  per Session: Not on file  Stress:   . Feeling of Stress : Not on file  Social Connections:   . Frequency of Communication with Friends and Family: Not on file  . Frequency of Social Gatherings with Friends and Family: Not on file  . Attends Religious Services: Not on file  . Active Member of Clubs or Organizations: Not on file  . Attends Archivist Meetings: Not on file  . Marital Status: Not on file  Intimate Partner Violence:   . Fear of Current or Ex-Partner: Not on file  . Emotionally Abused: Not on file  . Physically Abused: Not on file  . Sexually Abused: Not on file    Review of Systems: See HPI, otherwise negative ROS  Physical Exam: BP (!) 158/78   Pulse 63   Temp (!) 97.3 F (36.3 C) (Temporal)   Resp 20   Ht _0  (1.727 m)   Wt 115.2 kg   SpO2 97%   BMI 38.62 kg/m  General:   Alert,  pleasant and cooperative in NAD Head:  Normocephalic and atraumatic. Neck:  Supple; no masses or thyromegaly. Lungs:  Clear throughout to auscultation.    Heart:  Regular rate and rhythm. Abdomen:  Soft, nontender and nondistended. Normal bowel sounds, without guarding, and without rebound.   Neurologic:  Alert and  oriented x4;  grossly normal neurologically.  Impression/Plan: Casey Golden is here for an colonoscopy to be performed for a history of adenomatous polyps on 07/2016   Risks, benefits, limitations, and alternatives regarding  colonoscopy have been reviewed with the patient.  Questions have been answered.  All parties agreeable.   Lucilla Lame, MD  01/05/2020, 9:53 AM

## 2020-01-05 NOTE — Anesthesia Preprocedure Evaluation (Signed)
Anesthesia Evaluation  Patient identified by MRN, date of birth, ID band Patient awake    Reviewed: Allergy & Precautions, NPO status , Patient's Chart, lab work & pertinent test results  History of Anesthesia Complications Negative for: history of anesthetic complications  Airway Mallampati: III  TM Distance: >3 FB Neck ROM: Full    Dental  (+) Upper Dentures, Partial Lower, Poor Dentition, Chipped, Missing   Pulmonary sleep apnea , neg COPD, former smoker,    breath sounds clear to auscultation- rhonchi (-) wheezing      Cardiovascular hypertension, Pt. on medications + CAD  (-) Cardiac Stents and (-) CABG  Rhythm:Regular Rate:Normal - Systolic murmurs and - Diastolic murmurs    Neuro/Psych negative neurological ROS  negative psych ROS   GI/Hepatic Neg liver ROS, GERD  ,  Endo/Other  diabetes, Oral Hypoglycemic Agents  Renal/GU negative Renal ROS     Musculoskeletal  (+) Arthritis ,   Abdominal (+) + obese,   Peds  Hematology negative hematology ROS (+)   Anesthesia Other Findings Past Medical History: No date: Arthritis No date: CAD (coronary artery disease)     Comment: Cardiologist--  Dr. Ida Rogue No date: Colonic polyp 2015: Fatty liver disease, nonalcoholic     Comment: by [place] No date: GERD (gastroesophageal reflux disease) No date: History of chicken pox No date: Hyperlipidemia No date: Hypertension No date: Nocturia No date: OSA (obstructive sleep apnea)     Comment: per pt no cpap due to sleep center/ insurance               issue-- study done 2014 No date: Rash of genital area     Comment: 09-08-2014  per pt Dr Junious Silk aware No date: Seasonal and perennial allergic rhinitis No date: Type 2 diabetes mellitus ([place]) No date: Wears dentures     Comment: full upper/  partial lower No date: Wears glasses   Reproductive/Obstetrics                              Anesthesia Physical  Anesthesia Plan  ASA: III  Anesthesia Plan: General   Post-op Pain Management:    Induction: Intravenous  PONV Risk Score and Plan: 1 and Propofol, Propofol infusion and TIVA  Airway Management Planned: Natural Airway  Additional Equipment:   Intra-op Plan:   Post-operative Plan:   Informed Consent: I have reviewed the patients History and Physical, chart, labs and discussed the procedure including the risks, benefits and alternatives for the proposed anesthesia with the patient or authorized representative who has indicated his/her understanding and acceptance.     Dental advisory given  Plan Discussed with: CRNA and Anesthesiologist  Anesthesia Plan Comments: (Patient consented for risks of anesthesia including but not limited to:  - adverse reactions to medications - risk of airway placement if required - damage to eyes, teeth, lips or other oral mucosa - nerve damage due to positioning  - sore throat or hoarseness - Damage to heart, brain, nerves, lungs, other parts of body or loss of life  Patient voiced understanding.)        Anesthesia Quick Evaluation

## 2020-01-05 NOTE — Transfer of Care (Signed)
Immediate Anesthesia Transfer of Care Note  Patient: Casey Golden  Procedure(s) Performed: COLONOSCOPY WITH PROPOFOL (N/A )  Patient Location: PACU  Anesthesia Type:General  Level of Consciousness: sedated  Airway & Oxygen Therapy: Patient Spontanous Breathing  Post-op Assessment: Report given to RN and Post -op Vital signs reviewed and stable  Post vital signs: Reviewed and stable  Last Vitals:  Vitals Value Taken Time  BP 114/64 01/05/20 1018  Temp    Pulse 65 01/05/20 1018  Resp 15 01/05/20 1018  SpO2 96 % 01/05/20 1018  Vitals shown include unvalidated device data.  Last Pain:  Vitals:   01/05/20 0923  TempSrc: Temporal  PainSc: 0-No pain         Complications: No complications documented.

## 2020-01-06 ENCOUNTER — Encounter: Payer: Self-pay | Admitting: Gastroenterology

## 2020-01-06 LAB — SURGICAL PATHOLOGY

## 2020-01-07 ENCOUNTER — Encounter: Payer: Self-pay | Admitting: Gastroenterology

## 2020-01-14 ENCOUNTER — Other Ambulatory Visit: Payer: Self-pay

## 2020-01-14 ENCOUNTER — Encounter: Payer: Self-pay | Admitting: Podiatry

## 2020-01-14 ENCOUNTER — Ambulatory Visit: Payer: Medicare Other | Admitting: Podiatry

## 2020-01-14 DIAGNOSIS — E1142 Type 2 diabetes mellitus with diabetic polyneuropathy: Secondary | ICD-10-CM

## 2020-01-14 DIAGNOSIS — M2030 Hallux varus (acquired), unspecified foot: Secondary | ICD-10-CM | POA: Diagnosis not present

## 2020-01-14 DIAGNOSIS — M79674 Pain in right toe(s): Secondary | ICD-10-CM

## 2020-01-14 DIAGNOSIS — B351 Tinea unguium: Secondary | ICD-10-CM

## 2020-01-14 DIAGNOSIS — M79675 Pain in left toe(s): Secondary | ICD-10-CM | POA: Diagnosis not present

## 2020-01-14 NOTE — Progress Notes (Signed)
This patient returns to my office for at risk foot care.  This patient requires this care by a professional since this patient will be at risk due to having  Diabetes.  This patient is unable to cut nails himself since the patient cannot reach his  nails.These nails are painful walking and wearing shoes.  This patient presents for at risk foot care today. Patient questions status of his shoes.  General Appearance  Alert, conversant and in no acute stress.  Vascular  Dorsalis pedis and posterior tibial  pulses are palpable  bilaterally.  Capillary return is within normal limits  bilaterally. Temperature is within normal limits  bilaterally.  Neurologic  Senn-Weinstein monofilament wire test absent  bilaterally. Muscle power within normal limits bilaterally.  Nails Thick disfigured discolored nails with subungual debris  from hallux to fifth toes bilaterally. No evidence of bacterial infection or drainage bilaterally.  Orthopedic  No limitations of motion  feet .  No crepitus or effusions noted.  No bony pathology or digital deformities noted.  HAV with hallux malleus  B/L.  Hammer toes 2-5  B/L.  Skin  normotropic skin with no porokeratosis noted bilaterally.  No signs of infections or ulcers noted.     Onychomycosis  Pain in right toes  Pain in left toes  Consent was obtained for treatment procedures.   Mechanical debridement of nails 1-5  bilaterally performed with a nail nipper.  Filed with dremel without incident. No infection or ulcer.     Return office visit   4 months        Told patient to return for periodic foot care and evaluation due to potential at risk complications.   Gardiner Barefoot DPM

## 2020-02-19 ENCOUNTER — Telehealth: Payer: Self-pay | Admitting: Cardiovascular Disease

## 2020-02-19 MED ORDER — ATENOLOL 25 MG PO TABS
25.0000 mg | ORAL_TABLET | Freq: Every day | ORAL | 0 refills | Status: DC
Start: 2020-02-19 — End: 2020-04-21

## 2020-02-19 NOTE — Telephone Encounter (Signed)
*  STAT* If patient is at the pharmacy, call can be transferred to refill team.   1. Which medications need to be refilled? (please list name of each medication and dose if known) atenolol   2. Which pharmacy/location (including street and city if local pharmacy) is medication to be sent to? [facility] mail order  3. Do they need a 30 day or 90 day supply? [place]

## 2020-04-05 ENCOUNTER — Ambulatory Visit: Payer: Medicare Other | Admitting: Cardiovascular Disease

## 2020-04-21 ENCOUNTER — Other Ambulatory Visit: Payer: Self-pay | Admitting: Cardiovascular Disease

## 2020-04-21 NOTE — Telephone Encounter (Signed)
Rx request sent to pharmacy.  

## 2020-04-28 NOTE — Progress Notes (Signed)
Cardiology Office Note  Date:  05/02/2020   ID:  Casey Golden, DOB [birth year], MRN [id number]  PCP:  Ria Bush, MD   Chief Complaint  Patient presents with  . 12 month follow up     "doing well." Medications reviewed by the patient verbally.     HPI:  Casey Golden is a very pleasant 70 year old gentleman returns today for evaluation and management of his coronary artery disease,  Former smoker, quit 1990,  smoking for 27 years,   He has 50% mid RCA disease by cardiac catheterization in 2009 with 30% also LAD disease  mixed hyperlipidemia, hypertension.  syncopal episode many years ago while walking his dog. He was down for one minute. He had no warning. He has not had any further episodes since that time Extramammary Paget's disease of scrotum Who presents for follow-up of his coronary artery disease  Works at [employer],. Part time Helps to take care of his grandchildren  Continues to have back issues, Rare chest discomfort, atypical , not with exertion Does not feel like prior MI  Spending time with family No exercise program  Labs reviewed HBA1C 5.8 Total chol 95, LDL 42  EKG personally reviewed by myself on todays visit Shows normal sinus rhythm with rate 74 bpm no significant ST or T wave changes  Other past medical history reviewed Seen by dermatology Extramammary Paget's disease of scrotum , surgery at [facility], MOHS Post op hematoma next day CT scan: 5.6 x 8.4 x 14.2 cm curvilinear hematoma extending from the left inguinal region into the scrotal sac.   CT scan: degenerative changes throughout the spine associated with a thoracolumbar scoliosis. There is osseous foraminal narrowing in the lower lumbar spine, especially on the right at L4-5 and to the left at L5-S1.  Cardiac catheterization report 05/13/2006 detailing normal ejection fraction, 50% mid RCA disease  cardiac catheterization November 2009 showing 40% mid RCA disease, normal ejection fraction    testicular surgery 09/14/2014 in [place] with Dr. Junious Silk.  positive Myoview several years ago which resulted in a heart catheterization.  He had nonobstructive disease. His normal left ventricular function.   PMH:   has a past medical history of Arthritis, CAD (coronary artery disease), Cancer ([place]), Cataract (2019), Colonic polyp, Fatty liver disease, nonalcoholic (2263), GERD (gastroesophageal reflux disease), History of chicken pox, Hyperlipidemia, Hypertension, Nocturia, OSA (obstructive sleep apnea), Paget's disease of bony pelvis, Past use of tobacco, Rash of genital area, Seasonal and perennial allergic rhinitis, Type 2 diabetes mellitus ([place]), Wears dentures, and Wears glasses.  PSH:    Past Surgical History:  Procedure Laterality Date  . ABDOMINAL HERNIA REPAIR  2007      [facility]   open repair  . APPENDECTOMY  age 89  . CARDIAC CATHETERIZATION  12-23-2007   [facility]   Abnormal myoview w/ ischemia/  40% mRCA with nonobstructive and no sig. plaque in his left system, EF 55%  . CARDIAC CATHETERIZATION  Apr 2008    [facility]   Abnormal myoview/  50% RCA,  ef 65%  . CARDIAC CATHETERIZATION  1999      [facility]  . CATARACT EXTRACTION, BILATERAL Bilateral 11/2017  . CERVICAL FUSION  1992  . CHOLECYSTECTOMY OPEN  2006  . COLONOSCOPY WITH PROPOFOL N/A 04/17/2016   TAs, high grade dysplasia with margins clear, diverticulosis Lucilla Lame, MD)  . COLONOSCOPY WITH PROPOFOL N/A 08/07/2016   TAx1, diverticulosis, rpt 3 yrs ([place])  . COLONOSCOPY WITH PROPOFOL N/A 01/05/2020   SSP, rpt Allen Norris,  Darren, MD)  . DENTAL SURGERY     metal dental implant L mandible  . EXCISION OF SKIN TAG Right 09/14/2014   Procedure: EXCISION OF SKIN TAG;  Surgeon: Festus Aloe, MD;  Location: [facility];  Service: Urology;  Laterality: Right;  . GROIN MASS OPEN BIOPSY Left 01/17/2019  . HYDROCELE EXCISION Left 09/14/2014   Procedure: LEFT HYDROCELECTOMY ADULT;  Surgeon: Festus Aloe, MD;  Location:  [facility];  Service: Urology;  Laterality: Left;  . MOHS SURGERY  2015   skin cancer  . TONSILLECTOMY  age 30    Current Outpatient Medications  Medication Sig Dispense Refill  . aspirin 81 MG EC tablet Take 1 tablet (81 mg total) by mouth daily. 30 tablet 12  . atenolol (TENORMIN) 25 MG tablet Take 1 tablet (25 mg total) by mouth daily. Please keep your upcoming appointment for refills. 90 tablet 0  . atorvastatin (LIPITOR) 40 MG tablet Take 1 tablet (40 mg total) by mouth daily. 90 tablet 3  . Blood Glucose Monitoring Suppl (BLOOD GLUCOSE MONITOR SYSTEM) w/Device KIT 1 each by Does not apply route daily. Use as directed to check blood sugar once daily 1 each 0  . diclofenac sodium (VOLTAREN) 1 % GEL APPLY 2 G TOPICALLY 3 (THREE) TIMES DAILY AS NEEDED (ANTI INFLAMMATORY). 100 g 1  . gabapentin (NEURONTIN) 300 MG capsule TAKE 1 CAPSULE BY MOUTH 3  TIMES DAILY 270 capsule 1  . Glucose Blood (BLOOD GLUCOSE TEST STRIPS) STRP 1 each by In Vitro route daily. Use as directed to check blood sugar once daily 100 each 0  . ibuprofen (ADVIL,MOTRIN) 200 MG tablet Take 400-600 mg by mouth every 6 (six) hours as needed for mild pain (DEPENDS ON PAIN IF TAKES 400-600 MG).     . Lancets MISC 1 each by Does not apply route daily. Use as directed to check blood sugar daily 100 each 0  . metFORMIN (GLUCOPHAGE) 1000 MG tablet Take 1 tablet (1,000 mg total) by mouth 2 (two) times daily with a meal. 180 tablet 3  . nitroGLYCERIN (NITROSTAT) 0.4 MG SL tablet Place 1 tablet (0.4 mg total) under the tongue every 5 (five) minutes as needed for chest pain (do not take more than 3 doses.). 75 tablet 1   No current facility-administered medications for this visit.     Allergies:   Elemental sulfur and Sulfa antibiotics   Social History:  The patient  reports that he quit smoking about 30 years ago. His smoking use included cigarettes. He has a 70.00 pack-year smoking history. He has never used  smokeless tobacco. He reports that he does not drink alcohol and does not use drugs.   Family History:   family history includes CAD in his mother; Cancer in his sister; Cancer (age of onset: 59) in his father; Diabetes in his mother and paternal grandfather; Heart disease in his paternal grandfather and paternal grandmother; Stroke in his mother.    Review of Systems: Review of Systems  Constitutional: Negative.   HENT: Negative.   Respiratory: Negative.   Cardiovascular: Negative.   Gastrointestinal: Negative.   Musculoskeletal: Positive for back pain and joint pain.  Neurological: Negative.   Psychiatric/Behavioral: Negative.   All other systems reviewed and are negative.   PHYSICAL EXAM: VS:  BP (!) 150/60 (BP Location: Left Arm, Patient Position: Sitting, Cuff Size: Normal)   Pulse 74   Ht _0  (1.778 m)   Wt 246 lb (111.6 kg)  SpO2 98%   BMI 35.30 kg/m  , BMI Body mass index is 35.3 kg/m. Constitutional:  oriented to person, place, and time. No distress.  HENT:  Head: Grossly normal Eyes:  no discharge. No scleral icterus.  Neck: No JVD, no carotid bruits  Cardiovascular: Regular rate and rhythm, no murmurs appreciated Pulmonary/Chest: Clear to auscultation bilaterally, no wheezes or rails Abdominal: Soft.  no distension.  no tenderness.  Musculoskeletal: Normal range of motion Neurological:  normal muscle tone. Coordination normal. No atrophy Skin: Skin warm and dry Psychiatric: normal affect, pleasant  Recent Labs: 05/27/2019: ALT 39; Hemoglobin 16.3; Platelets 244.0 11/25/2019: BUN 21; Creatinine, Ser 0.81; Potassium 4.3; Sodium 137    Lipid Panel Lab Results  Component Value Date   CHOL 95 11/25/2019   HDL 31.20 (L) 11/25/2019   LDLCALC 43 03/21/2017   TRIG 223.0 (H) 11/25/2019      Wt Readings from Last 3 Encounters:  05/02/20 246 lb (111.6 kg)  01/05/20 254 lb (115.2 kg)  12/28/19 248 lb 12.8 oz (112.9 kg)      ASSESSMENT AND  PLAN:  Coronary artery disease involving native coronary artery of native heart without angina pectoris - Plan: EKG 12-Lead Currently with no symptoms of angina. No further workup at this time. Continue current medication regimen. Lipid panel at goal, diabetes numbers at goal  Essential hypertension Blood pressure is well controlled on today's visit. No changes made to the medications.  controlled type 2 diabetes with neuropathy (HCC) Hemoglobin A1c improved Active weight stable, A1C well controlled  Osteoarthritis Chronic knee pain, back pain Long hours of working at [employer]  Chronic midline low back pain, with sciatica presence unspecified Previously seen by chiropractor Continues to work, prior back surgery Limits lifting   Total encounter time more than 25 minutes  Greater than 50% was spent in counseling and coordination of care with the patient   No orders of the defined types were placed in this encounter.    Signed, Esmond Plants, M.D., Ph.D. 05/02/2020  [facility] [place], [place] [phone]

## 2020-05-02 ENCOUNTER — Encounter: Payer: Self-pay | Admitting: Cardiovascular Disease

## 2020-05-02 ENCOUNTER — Ambulatory Visit: Payer: Medicare Other | Admitting: Cardiovascular Disease

## 2020-05-02 ENCOUNTER — Other Ambulatory Visit: Payer: Self-pay

## 2020-05-02 VITALS — BP 150/60 | HR 74 | Ht 70.0 in | Wt 246.0 lb

## 2020-05-02 DIAGNOSIS — E782 Mixed hyperlipidemia: Secondary | ICD-10-CM | POA: Diagnosis not present

## 2020-05-02 DIAGNOSIS — I25118 Atherosclerotic heart disease of native coronary artery with other forms of angina pectoris: Secondary | ICD-10-CM

## 2020-05-02 DIAGNOSIS — I700 Atherosclerosis of aorta: Secondary | ICD-10-CM

## 2020-05-02 DIAGNOSIS — E1165 Type 2 diabetes mellitus with hyperglycemia: Secondary | ICD-10-CM

## 2020-05-02 DIAGNOSIS — I10 Essential (primary) hypertension: Secondary | ICD-10-CM

## 2020-05-02 DIAGNOSIS — E1140 Type 2 diabetes mellitus with diabetic neuropathy, unspecified: Secondary | ICD-10-CM | POA: Diagnosis not present

## 2020-05-02 DIAGNOSIS — IMO0002 Reserved for concepts with insufficient information to code with codable children: Secondary | ICD-10-CM

## 2020-05-02 MED ORDER — ATENOLOL 25 MG PO TABS
25.0000 mg | ORAL_TABLET | Freq: Every day | ORAL | 3 refills | Status: DC
Start: 2020-05-02 — End: 2020-11-25

## 2020-05-02 NOTE — Patient Instructions (Addendum)
Medication Instructions:  No changes  If you need a refill on your cardiac medications before your next appointment, please call your pharmacy.    Lab work: No new labs needed   If you have labs (blood work) drawn today and your tests are completely normal, you will receive your results only by: . MyChart Message (if you have MyChart) OR . A paper copy in the mail If you have any lab test that is abnormal or we need to change your treatment, we will call you to review the results.   Testing/Procedures: No new testing needed   Follow-Up: At [facility], you and your health needs are our priority.  As part of our continuing mission to provide you with exceptional heart care, we have created designated Provider Care Teams.  These Care Teams include your primary Cardiologist (physician) and Advanced Practice Providers (APPs -  Physician Assistants and Nurse Practitioners) who all work together to provide you with the care you need, when you need it.  . You will need a follow up appointment in 12 months  . Providers on your designated Care Team:   . Christopher Berge, NP . Ryan Dunn, PA-C . Jacquelyn Visser, PA-C  Any Other Special Instructions Will Be Listed Below (If Applicable).  COVID-19 Vaccine Information can be found at: [email or web address] For questions related to vaccine distribution or appointments, please email [email or web address] or call [phone].     

## 2020-05-12 ENCOUNTER — Telehealth: Payer: Self-pay

## 2020-05-12 NOTE — Telephone Encounter (Signed)
Vm from pt requesting call to schedule appt.  Plz schedule wellness, lab and cpe visits.

## 2020-05-16 ENCOUNTER — Ambulatory Visit: Payer: Medicare Other | Admitting: Podiatry

## 2020-05-16 ENCOUNTER — Encounter: Payer: Self-pay | Admitting: Podiatry

## 2020-05-16 ENCOUNTER — Other Ambulatory Visit: Payer: Self-pay

## 2020-05-16 DIAGNOSIS — E1142 Type 2 diabetes mellitus with diabetic polyneuropathy: Secondary | ICD-10-CM

## 2020-05-16 DIAGNOSIS — Q828 Other specified congenital malformations of skin: Secondary | ICD-10-CM | POA: Insufficient documentation

## 2020-05-16 DIAGNOSIS — M79675 Pain in left toe(s): Secondary | ICD-10-CM | POA: Diagnosis not present

## 2020-05-16 DIAGNOSIS — B351 Tinea unguium: Secondary | ICD-10-CM

## 2020-05-16 DIAGNOSIS — M216X1 Other acquired deformities of right foot: Secondary | ICD-10-CM | POA: Insufficient documentation

## 2020-05-16 DIAGNOSIS — M216X2 Other acquired deformities of left foot: Secondary | ICD-10-CM | POA: Insufficient documentation

## 2020-05-16 DIAGNOSIS — M79674 Pain in right toe(s): Secondary | ICD-10-CM

## 2020-05-16 NOTE — Progress Notes (Signed)
This patient returns to my office for at risk foot care.  This patient requires this care by a professional since this patient will be at risk due to having  Diabetes.  This patient is unable to cut nails himself since the patient cannot reach his  nails.These nails are painful walking and wearing shoes.  This patient presents for at risk foot care today. Patient says he has pain across the forefoot especially his left foot.  He desires an evaluation of his feet.  He believes he needs additional gabapentin for this condition.  General Appearance  Alert, conversant and in no acute stress.  Vascular  Dorsalis pedis and posterior tibial  pulses are palpable  bilaterally.  Capillary return is within normal limits  bilaterally. Temperature is within normal limits  bilaterally.  Neurologic  Senn-Weinstein monofilament wire test absent  bilaterally. Muscle power within normal limits bilaterally.  Nails Thick disfigured discolored nails with subungual debris  from hallux to fifth toes bilaterally. No evidence of bacterial infection or drainage bilaterally.  Orthopedic  No limitations of motion  feet .  No crepitus or effusions noted.  No bony pathology or digital deformities noted.  HAV with hallux malleus  B/L.  Hammer toes 2-5  B/L. Plantar flexed metatarsaL 2-5  B/L.  Skin  normotropic skin with no porokeratosis noted bilaterally.  No signs of infections or ulcers noted.   Callus sub 5th met. Left foot.  Onychomycosis  Pain in right toes  Pain in left toes  Callus left foot.  Consent was obtained for treatment procedures.   Mechanical debridement of nails 1-5  bilaterally performed with a nail nipper.  Filed with dremel without incident.Debride callus sub 5th met left foot.  No infection or ulcer.  Padding dispensed and applied to his diabetic insoles.   Return office visit   4 months        Told patient to return for periodic foot care and evaluation due to potential at risk  complications.   Gardiner Barefoot DPM

## 2020-06-01 DIAGNOSIS — H905 Unspecified sensorineural hearing loss: Secondary | ICD-10-CM | POA: Diagnosis not present

## 2020-06-03 ENCOUNTER — Other Ambulatory Visit: Payer: Self-pay | Admitting: Family Medicine

## 2020-06-06 NOTE — Telephone Encounter (Signed)
Pharmacy requests refill on: Gabapentin 300 mg   LAST REFILL: 12/17/2019 (Q-270, R-1) LAST OV: 11/25/2019  NEXT OV: 07/13/2020 PHARMACY: [facility]  Pharmacy requests refill on: Atorvastatin 40 mg   LAST REFILL: 07/06/2019 (Q-90, R-3) LAST OV: 11/25/2019 NEXT OV: 07/13/2020 PHARMACY: [facility]   Pharmacy requests refill on: Metformin 1000 mg   LAST REFILL: 07/06/2019 (Q-180, R-3) LAST OV: 11/25/2019  NEXT OV: 07/13/2020 PHARMACY: [facility]  Hgb A1C (11/25/2019): 5.8

## 2020-06-16 DIAGNOSIS — L570 Actinic keratosis: Secondary | ICD-10-CM | POA: Diagnosis not present

## 2020-06-16 DIAGNOSIS — D492 Neoplasm of unspecified behavior of bone, soft tissue, and skin: Secondary | ICD-10-CM | POA: Diagnosis not present

## 2020-06-16 DIAGNOSIS — D229 Melanocytic nevi, unspecified: Secondary | ICD-10-CM | POA: Diagnosis not present

## 2020-06-16 DIAGNOSIS — Z85828 Personal history of other malignant neoplasm of skin: Secondary | ICD-10-CM | POA: Diagnosis not present

## 2020-06-16 DIAGNOSIS — L821 Other seborrheic keratosis: Secondary | ICD-10-CM | POA: Diagnosis not present

## 2020-06-16 DIAGNOSIS — L814 Other melanin hyperpigmentation: Secondary | ICD-10-CM | POA: Diagnosis not present

## 2020-07-02 ENCOUNTER — Other Ambulatory Visit: Payer: Self-pay | Admitting: Family Medicine

## 2020-07-02 DIAGNOSIS — E118 Type 2 diabetes mellitus with unspecified complications: Secondary | ICD-10-CM

## 2020-07-02 DIAGNOSIS — E1169 Type 2 diabetes mellitus with other specified complication: Secondary | ICD-10-CM

## 2020-07-02 DIAGNOSIS — C4499 Other specified malignant neoplasm of skin, unspecified: Secondary | ICD-10-CM

## 2020-07-02 DIAGNOSIS — R972 Elevated prostate specific antigen [PSA]: Secondary | ICD-10-CM

## 2020-07-02 DIAGNOSIS — E785 Hyperlipidemia, unspecified: Secondary | ICD-10-CM

## 2020-07-05 ENCOUNTER — Other Ambulatory Visit: Payer: Self-pay

## 2020-07-05 ENCOUNTER — Other Ambulatory Visit (INDEPENDENT_AMBULATORY_CARE_PROVIDER_SITE_OTHER): Payer: Medicare Other

## 2020-07-05 DIAGNOSIS — C4499 Other specified malignant neoplasm of skin, unspecified: Secondary | ICD-10-CM | POA: Diagnosis not present

## 2020-07-05 DIAGNOSIS — E785 Hyperlipidemia, unspecified: Secondary | ICD-10-CM | POA: Diagnosis not present

## 2020-07-05 DIAGNOSIS — R972 Elevated prostate specific antigen [PSA]: Secondary | ICD-10-CM | POA: Diagnosis not present

## 2020-07-05 DIAGNOSIS — E1169 Type 2 diabetes mellitus with other specified complication: Secondary | ICD-10-CM | POA: Diagnosis not present

## 2020-07-05 DIAGNOSIS — E118 Type 2 diabetes mellitus with unspecified complications: Secondary | ICD-10-CM | POA: Diagnosis not present

## 2020-07-05 LAB — COMPREHENSIVE METABOLIC PANEL
ALT: 42 U/L (ref 0–53)
AST: 40 U/L — ABNORMAL HIGH (ref 0–37)
Albumin: 4.5 g/dL (ref 3.5–5.2)
Alkaline Phosphatase: 73 U/L (ref 39–117)
BUN: 15 mg/dL (ref 6–23)
CO2: 23 mEq/L (ref 19–32)
Calcium: 9.8 mg/dL (ref 8.4–10.5)
Chloride: 101 mEq/L (ref 96–112)
Creatinine, Ser: 0.92 mg/dL (ref 0.40–1.50)
GFR: 83.3 mL/min (ref >60.00)
Glucose, Bld: 127 mg/dL — ABNORMAL HIGH (ref 70–99)
Potassium: 4.8 mEq/L (ref 3.5–5.1)
Sodium: 140 mEq/L (ref 135–145)
Total Bilirubin: 0.9 mg/dL (ref 0.2–1.2)
Total Protein: 7.3 g/dL (ref 6.0–8.3)

## 2020-07-05 LAB — CBC WITH DIFFERENTIAL/PLATELET
Basophils Absolute: 0.1 10*3/uL (ref 0.0–0.1)
Basophils Relative: 0.7 % (ref 0.0–3.0)
Eosinophils Absolute: 0.3 10*3/uL (ref 0.0–0.7)
Eosinophils Relative: 4 % (ref 0.0–5.0)
HCT: 46.9 % (ref 39.0–52.0)
Hemoglobin: 16.3 g/dL (ref 13.0–17.0)
Lymphocytes Relative: 39 % (ref 12.0–46.0)
Lymphs Abs: 3 10*3/uL (ref 0.7–4.0)
MCHC: 34.8 g/dL (ref 30.0–36.0)
MCV: 98.4 fl (ref 78.0–100.0)
Monocytes Absolute: 0.6 10*3/uL (ref 0.1–1.0)
Monocytes Relative: 8.1 % (ref 3.0–12.0)
Neutro Abs: 3.7 10*3/uL (ref 1.4–7.7)
Neutrophils Relative %: 48.2 % (ref 43.0–77.0)
Platelets: 233 10*3/uL (ref 150.0–400.0)
RBC: 4.77 Mil/uL (ref 4.22–5.81)
RDW: 12.2 % (ref 11.5–15.5)
WBC: 7.6 10*3/uL (ref 4.0–10.5)

## 2020-07-05 LAB — LIPID PANEL
Cholesterol: 119 mg/dL (ref 0–200)
HDL: 30.9 mg/dL — ABNORMAL LOW (ref >39.00)
NonHDL: 88.16
Total CHOL/HDL Ratio: 4
Triglycerides: 283 mg/dL — ABNORMAL HIGH (ref 0.0–149.0)
VLDL: 56.6 mg/dL — ABNORMAL HIGH (ref 0.0–40.0)

## 2020-07-05 LAB — LDL CHOLESTEROL, DIRECT: Direct LDL: 45 mg/dL

## 2020-07-05 LAB — HEMOGLOBIN A1C: Hgb A1c MFr Bld: 6.5 % (ref 4.6–6.5)

## 2020-07-06 ENCOUNTER — Other Ambulatory Visit: Payer: Medicare Other

## 2020-07-06 ENCOUNTER — Ambulatory Visit (INDEPENDENT_AMBULATORY_CARE_PROVIDER_SITE_OTHER): Payer: Medicare Other

## 2020-07-06 DIAGNOSIS — M2011 Hallux valgus (acquired), right foot: Secondary | ICD-10-CM

## 2020-07-06 DIAGNOSIS — E1142 Type 2 diabetes mellitus with diabetic polyneuropathy: Secondary | ICD-10-CM

## 2020-07-06 DIAGNOSIS — Z0000 Encounter for general adult medical examination without abnormal findings: Secondary | ICD-10-CM | POA: Diagnosis not present

## 2020-07-06 LAB — PSA, TOTAL AND FREE
PSA, % Free: 23 % (calc) — ABNORMAL LOW (ref >25)
PSA, Free: 0.7 ng/mL
PSA, Total: 3 ng/mL (ref <=4.0)

## 2020-07-06 LAB — MICROALBUMIN / CREATININE URINE RATIO
Creatinine,U: 36.2 mg/dL
Microalb Creat Ratio: 1.9 mg/g (ref 0.0–30.0)
Microalb, Ur: <0.7 mg/dL (ref 0.0–1.9)

## 2020-07-06 NOTE — Progress Notes (Signed)
PCP notes:  Health Maintenance: Eye exam- due covid booster- due   Abnormal Screenings: none   Patient concerns: Discuss possible lung cancer screening due to past smoking history in the 1990's   Nurse concerns: None    Next PCP appt.: 07/13/2020 @ 3:30 pm

## 2020-07-06 NOTE — Progress Notes (Signed)
Subjective:   Casey Golden is a 70 y.o. male who presents for Medicare Annual/Subsequent preventive examination.  Review of Systems: N/A      I connected with the patient today by telephone and verified that I am speaking with the correct person using two identifiers. Location patient: home Location nurse: work Persons participating in the telephone visit: patient, nurse.   I discussed the limitations, risks, security and privacy concerns of performing an evaluation and management service by telephone and the availability of in person appointments. I also discussed with the patient that there may be a patient responsible charge related to this service. The patient expressed understanding and verbally consented to this telephonic visit.        Cardiac Risk Factors include: advanced age (>67mn, >>54women);male gender;diabetes mellitus     Objective:    Today's Vitals   There is no height or weight on file to calculate BMI.  Advanced Directives 07/06/2020 01/05/2020 01/17/2019 07/23/2018 06/26/2018 06/12/2018 06/12/2018  Does Patient Have a Medical Advance Directive? Yes Yes No No Yes Yes No  Type of AParamedicof AMurphysLiving will - - - Living will Living will -  Does patient want to make changes to medical advance directive? - - - - - No - Patient declined -  Copy of [place] Chart? No - copy requested - - - - - -  Would patient like information on creating a medical advance directive? - - - - - - No - Patient declined    Current Medications (verified) Outpatient Encounter Medications as of 07/06/2020  Medication Sig  . aspirin 81 MG EC tablet Take 1 tablet (81 mg total) by mouth daily.  .Marland Kitchenatenolol (TENORMIN) 25 MG tablet Take 1 tablet (25 mg total) by mouth daily.  .Marland Kitchenatorvastatin (LIPITOR) 40 MG tablet TAKE 1 TABLET BY MOUTH  DAILY  . Blood Glucose Monitoring Suppl (BLOOD GLUCOSE MONITOR SYSTEM) w/Device KIT 1 each by Does not  apply route daily. Use as directed to check blood sugar once daily  . diclofenac sodium (VOLTAREN) 1 % GEL APPLY 2 G TOPICALLY 3 (THREE) TIMES DAILY AS NEEDED (ANTI INFLAMMATORY).  .Marland Kitchengabapentin (NEURONTIN) 300 MG capsule TAKE 1 CAPSULE BY MOUTH 3  TIMES DAILY  . Glucose Blood (BLOOD GLUCOSE TEST STRIPS) STRP 1 each by In Vitro route daily. Use as directed to check blood sugar once daily  . ibuprofen (ADVIL,MOTRIN) 200 MG tablet Take 400-600 mg by mouth every 6 (six) hours as needed for mild pain (DEPENDS ON PAIN IF TAKES 400-600 MG).   . Lancets MISC 1 each by Does not apply route daily. Use as directed to check blood sugar daily  . metFORMIN (GLUCOPHAGE) 1000 MG tablet TAKE 1 TABLET BY MOUTH  TWICE DAILY WITH A MEAL  . nitroGLYCERIN (NITROSTAT) 0.4 MG SL tablet Place 1 tablet (0.4 mg total) under the tongue every 5 (five) minutes as needed for chest pain (do not take more than 3 doses.).   No facility-administered encounter medications on file as of 07/06/2020.    Allergies (verified) Elemental sulfur and Sulfa antibiotics   History: Past Medical History:  Diagnosis Date  . Arthritis   . CAD (coronary artery disease)    cathx3 with nonobstructive disease. Last cath with RCA 40% stenosis in 2009  . [place]   . Cataract 2019   bilateral; resolved with surgery  . Colonic polyp   . Fatty liver disease, nonalcoholic 29201  by [place]  . GERD (gastroesophageal reflux disease)   . History of chicken pox   . Hyperlipidemia   . Hypertension   . Nocturia   . OSA (obstructive sleep apnea)    CPAP, compliant  . Paget's disease of bony pelvis   . Past use of tobacco    Quit 1990, 70 pack year history  . Rash of genital area    09-08-2014  per pt Dr Junious Silk aware  . Seasonal and perennial allergic rhinitis   . Type 2 diabetes mellitus ([place])   . Wears dentures    full upper/  partial lower  . Wears glasses    Past Surgical History:  Procedure Laterality Date  . ABDOMINAL HERNIA REPAIR   2007      [facility]   open repair  . APPENDECTOMY  age 51  . CARDIAC CATHETERIZATION  12-23-2007   [facility]   Abnormal myoview w/ ischemia/  40% mRCA with nonobstructive and no sig. plaque in his left system, EF 55%  . CARDIAC CATHETERIZATION  Apr 2008    [facility]   Abnormal myoview/  50% RCA,  ef 65%  . CARDIAC CATHETERIZATION  1999      [facility]  . CATARACT EXTRACTION, BILATERAL Bilateral 11/2017  . CERVICAL FUSION  1992  . CHOLECYSTECTOMY OPEN  2006  . COLONOSCOPY WITH PROPOFOL N/A 04/17/2016   TAs, high grade dysplasia with margins clear, diverticulosis Lucilla Lame, MD)  . COLONOSCOPY WITH PROPOFOL N/A 08/07/2016   TAx1, diverticulosis, rpt 3 yrs ([place])  . COLONOSCOPY WITH PROPOFOL N/A 01/05/2020   [facility], rpt Allen Norris, Darren, MD)  . DENTAL SURGERY     metal dental implant L mandible  . EXCISION OF SKIN TAG Right 09/14/2014   Procedure: EXCISION OF SKIN TAG;  Surgeon: Festus Aloe, MD;  [facility];  Service: Urology;  Laterality: Right;  . GROIN MASS OPEN BIOPSY Left 01/17/2019  . HYDROCELE EXCISION Left 09/14/2014   Procedure: LEFT HYDROCELECTOMY ADULT;  Surgeon: Festus Aloe, MD;  [facility];  Service: Urology;  Laterality: Left;  . MOHS SURGERY  2015   skin cancer  . TONSILLECTOMY  age 24   Family History  Problem Relation Age of Onset  . CAD Mother        MI  . Diabetes Mother   . Stroke Mother        mini-stroke  . Cancer Father 64       lung (smoker)  . Heart disease Paternal Grandmother   . Heart disease Paternal Grandfather   . Diabetes Paternal Grandfather   . Cancer Sister        lung  . Coronary artery disease Neg Hx        Premature   Social History   Socioeconomic History  . Marital status: Married    Spouse name: Not on file  . Number of children: Not on file  . Years of education: Not on file  . Highest education level: Not on file  Occupational History  . Occupation: Full time    Employer: [employer]  Tobacco Use  . Smoking status: Former Smoker    Packs/day: 2.00    Years: 35.00    Pack years: 70.00    Types: Cigarettes    Quit date: 01/29/1990    Years since quitting: 30.4  . Smokeless tobacco: Never Used  Vaping Use  . Vaping Use: Never used  Substance and Sexual Activity  . Alcohol use: No  .  Drug use: No  . Sexual activity: Yes  Other Topics Concern  . Not on file  Social History Narrative   Lives with wife, dog and cats    Occupation: retired, was self employed, now works at [employer]    Edu: HS   Activity: walks 1.5 mi daily   Diet: some water, fruits/vegetables daily   Social Determinants of Radio broadcast assistant Strain: Low Risk   . Difficulty of Paying Living Expenses: Not hard at all  Food Insecurity: No Food Insecurity  . Worried About Charity fundraiser in the Last Year: Never true  . Ran Out of Food in the Last Year: Never true  Transportation Needs: No Transportation Needs  . Lack of Transportation (Medical): No  . Lack of Transportation (Non-Medical): No  Physical Activity: Sufficiently Active  . Days of Exercise per Week: 7 days  . Minutes of Exercise per Session: 30 min  Stress: No Stress Concern Present  . Feeling of Stress : Not at all  Social Connections: Not on file    Tobacco Counseling Counseling given: Not Answered   Clinical Intake:  Pre-visit preparation completed: Yes  Pain : No/denies pain     Nutritional Risks: None Diabetes: Yes CBG done?: No Did pt. bring in CBG monitor from home?: No  How often do you need to have someone help you when you read instructions, pamphlets, or other written materials from your doctor or pharmacy?: 1 - Never  Diabetic: Yes Nutrition Risk Assessment:  Has the patient had any N/V/D within the last 2 months?  No  Does the patient have any non-healing wounds?  No  Has the patient had any unintentional weight loss or weight gain?  No   Diabetes:  Is the patient diabetic?   Yes  If diabetic, was a CBG obtained today?  No  telephone visit  Did the patient bring in their glucometer from home?  No  telephone visit  How often do you monitor your CBG's? daily.   Financial Strains and Diabetes Management:  Are you having any financial strains with the device, your supplies or your medication? No .  Does the patient want to be seen by Chronic Care Management for management of their diabetes?  No  Would the patient like to be referred to a Nutritionist or for Diabetic Management?  No   Diabetic Exams:  Diabetic Eye Exam: Overdue for diabetic eye exam. Pt has been advised about the importance in completing this exam. Patient advised to call and schedule an eye exam. Diabetic Foot Exam: Completed 11/25/2019   Interpreter Needed?: No  Information entered by :: Vance, LPN   Activities of Daily Living In your present state of health, do you have any difficulty performing the following activities: 07/06/2020  Hearing? Y  Comment wears hearing aid  Vision? N  Difficulty concentrating or making decisions? N  Walking or climbing stairs? N  Dressing or bathing? N  Doing errands, shopping? N  Preparing Food and eating ? N  Using the Toilet? N  In the past six months, have you accidently leaked urine? N  Do you have problems with loss of bowel control? N  Managing your Medications? N  Managing your Finances? N  Housekeeping or managing your Housekeeping? N  Some recent data might be hidden    Patient Care Team: Ria Bush, MD as PCP - General (Family Medicine) Rockey Situ Kathlene November, MD as PCP - Cardiology (Cardiology) Minna Merritts, MD as  Consulting Physician (Cardiology)  Indicate any recent Medical Services you may have received from other than [facility] providers in the past year (date may be approximate).     Assessment:   This is a routine wellness examination for Barnard.  Hearing/Vision screen  Hearing Screening   125Hz  250Hz  500Hz  1000Hz   2000Hz  3000Hz  4000Hz  6000Hz  8000Hz   Right ear:           Left ear:           Vision Screening Comments: Patient gets annual eye exams, currently due.   Dietary issues and exercise activities discussed: Current Exercise Habits: Home exercise routine, Type of exercise: walking, Time (Minutes): 30, Frequency (Times/Week): 7, Weekly Exercise (Minutes/Week): 210, Intensity: Moderate, Exercise limited by: None identified  Goals Addressed            This Visit's Progress   . Patient Stated       07/06/2020, I will continue to walk daily 6,000-8,000 steps daily.      Depression Screen PHQ 2/9 Scores 07/06/2020 05/27/2019 05/21/2018 11/01/2017 05/13/2017 03/27/2017 08/11/2014  PHQ - 2 Score 0 0 0 0 0 0 0  PHQ- 9 Score 0 3 0 - 0 - -    Fall Risk Fall Risk  07/06/2020 05/27/2019 05/21/2018 11/01/2017 07/01/2017  Falls in the past year? 0 0 0 No No  Number falls in past yr: 0 - - - -  Injury with Fall? 0 - - - -  Risk for fall due to : Medication side effect - - - -  Follow up Falls evaluation completed;Falls prevention discussed - - - -    FALL RISK PREVENTION PERTAINING TO THE HOME:  Any stairs in or around the home? Yes  If so, are there any without handrails? No  Home free of loose throw rugs in walkways, pet beds, electrical cords, etc? Yes  Adequate lighting in your home to reduce risk of falls? Yes   ASSISTIVE DEVICES UTILIZED TO PREVENT FALLS:  Life alert? No  Use of a cane, walker or w/c? No  Grab bars in the bathroom? No  Shower chair or bench in shower? No  Elevated toilet seat or a handicapped toilet? No   TIMED UP AND GO:  Was the test performed? N/A telephone visit .    Cognitive Function: MMSE - Mini Mental State Exam 07/06/2020 05/21/2018 05/13/2017  Orientation to time 5 5 5   Orientation to Place 5 5 5   Registration 3 3 3   Attention/ Calculation 5 0 0  Recall 3 2 2   Recall-comments - unable to recall 1 of 3 words unable to recall 1 of 3 words  Language- name 2 objects - 0  0  Language- repeat 1 1 1   Language- follow 3 step command - 0 1  Language- follow 3 step command-comments - - unable to follow 1 step of 3 step command  Language- read & follow direction - 0 0  Write a sentence - 0 0  Copy design - 0 0  Total score - 16 17  Mini Cog  Mini-Cog screen was completed. Maximum score is 22. A value of 0 denotes this part of the MMSE was not completed or the patient failed this part of the Mini-Cog screening.       Immunizations Immunization History  Administered Date(s) Administered  . Influenza, High Dose Seasonal PF 11/30/2013, 09/15/2018, 09/03/2019  . Influenza,inj,Quad PF,6+ Mos 10/15/2012, 09/30/2015, 09/06/2017  . Influenza,inj,quad, With Preservative 10/29/2016  . Influenza-Unspecified 11/01/2014, 10/13/2016  .  PFIZER(Purple Top)SARS-COV-2 Vaccination 04/02/2019, 04/23/2019, 11/29/2019  . Pneumococcal Conjugate-13 09/05/2015  . Pneumococcal Polysaccharide-23 01/30/2011, 10/13/2016, 10/18/2016, 04/07/2018, 04/18/2018  . Td 10/27/2015  . Tdap 10/27/2015, 06/12/2018  . Zoster Recombinat (Shingrix) 09/13/2017, 12/15/2017  . Zoster, Live 09/15/2012    TDAP status: Up to date  Flu Vaccine status: Up to date  Pneumococcal vaccine status: Up to date  Covid-19 vaccine status: 3 doses completed, Patient aware booster is due  Qualifies for Shingles Vaccine? Yes   Zostavax completed Yes   Shingrix Completed?: Yes  Screening Tests Health Maintenance  Topic Date Due  . Pneumococcal Vaccine 14-66 Years old (1 of 4 - PCV13) Never done  . COVID-19 Vaccine (4 - Booster for [place] series) 02/29/2020  . OPHTHALMOLOGY EXAM  03/24/2020  . INFLUENZA VACCINE  08/29/2020  . FOOT EXAM  11/24/2020  . HEMOGLOBIN A1C  01/04/2021  . URINE MICROALBUMIN  07/05/2021  . COLONOSCOPY (Pts 45-35yr Insurance coverage will need to be confirmed)  01/04/2025  . TETANUS/TDAP  06/11/2028  . Hepatitis C Screening  Completed  . PNA vac Low Risk Adult  Completed  .  Zoster Vaccines- Shingrix  Completed  . HPV VACCINES  Aged Out    Health Maintenance  Health Maintenance Due  Topic Date Due  . Pneumococcal Vaccine 028663Years old (1 of 4 - PCV13) Never done  . COVID-19 Vaccine (4 - Booster for [place]) 02/29/2020  . OPHTHALMOLOGY EXAM  03/24/2020    Colorectal cancer screening: Type of screening: Colonoscopy. Completed 01/05/2020. Repeat every 5 years  Lung Cancer Screening: (Low Dose CT Chest recommended if Age 72-80years, 30 pack-year currently smoking OR have quit w/in 15years.) does not qualify.   Additional Screening:  Hepatitis C Screening: does qualify; Completed 03/01/2015  Vision Screening: Recommended annual ophthalmology exams for early detection of glaucoma and other disorders of the eye. Is the patient up to date with their annual eye exam?  No  Who is the provider or what is the name of the office in which the patient attends annual eye exams? In the process of finding a new eye doctor, will schedule appointment soon  If pt is not established with a provider, would they like to be referred to a provider to establish care? No .   Dental Screening: Recommended annual dental exams for proper oral hygiene  Community Resource Referral / Chronic Care Management: CRR required this visit?  No   CCM required this visit?  No      Plan:     I have personally reviewed and noted the following in the patient's chart:   . Medical and social history . Use of alcohol, tobacco or illicit drugs  . Current medications and supplements including opioid prescriptions. Patient is not currently taking opioid prescriptions. . Functional ability and status . Nutritional status . Physical activity . Advanced directives . List of other physicians . Hospitalizations, surgeries, and ER visits in previous 12 months . Vitals . Screenings to include cognitive, depression, and falls . Referrals and appointments  In addition, I have reviewed and  discussed with patient certain preventive protocols, quality metrics, and best practice recommendations. A written personalized care plan for preventive services as well as general preventive health recommendations were provided to patient.   Due to this being a telephonic visit, the after visit summary with patients personalized plan was offered to patient via office or my-chart. Patient preferred to pick up at office at next visit or via mychart.  JAndrez Grime  LPN   06/04/1546

## 2020-07-06 NOTE — Patient Instructions (Signed)
Casey Golden , Thank you for taking time to come for your Medicare Wellness Visit. I appreciate your ongoing commitment to your health goals. Please review the following plan we discussed and let me know if I can assist you in the future.   Screening recommendations/referrals: Colonoscopy: Up to date, completed 01/05/2020, due 12/2024 Recommended yearly ophthalmology/optometry visit for glaucoma screening and checkup Recommended yearly dental visit for hygiene and checkup  Vaccinations: Influenza vaccine: Up to date, completed 09/03/2019, due 08/2020 Pneumococcal vaccine: Completed series Tdap vaccine: Up to date, completed 06/12/2018, due 05/2028 Shingles vaccine: Completed series   Covid-19: 3 doses completed, Patient aware booster is due  Advanced directives: Please bring a copy of your POA (Power of Attorney) and/or Living Will to your next appointment.   Conditions/risks identified: diabetes  Next appointment: Follow up in one year for your annual wellness visit.   Preventive Care 72 Years and Older, Male Preventive care refers to lifestyle choices and visits with your health care provider that can promote health and wellness. What does preventive care include?  A yearly physical exam. This is also called an annual well check.  Dental exams once or twice a year.  Routine eye exams. Ask your health care provider how often you should have your eyes checked.  Personal lifestyle choices, including:  Daily care of your teeth and gums.  Regular physical activity.  Eating a healthy diet.  Avoiding tobacco and drug use.  Limiting alcohol use.  Practicing safe sex.  Taking low doses of aspirin every day.  Taking vitamin and mineral supplements as recommended by your health care provider. What happens during an annual well check? The services and screenings done by your health care provider during your annual well check will depend on your age, overall health, lifestyle risk factors,  and family history of disease. Counseling  Your health care provider may ask you questions about your:  Alcohol use.  Tobacco use.  Drug use.  Emotional well-being.  Home and relationship well-being.  Sexual activity.  Eating habits.  History of falls.  Memory and ability to understand (cognition).  Work and work Statistician. Screening  You may have the following tests or measurements:  Height, weight, and BMI.  Blood pressure.  Lipid and cholesterol levels. These may be checked every 5 years, or more frequently if you are over 16 years old.  Skin check.  Lung cancer screening. You may have this screening every year starting at age 19 if you have a 30-pack-year history of smoking and currently smoke or have quit within the past 15 years.  Fecal occult blood test (FOBT) of the stool. You may have this test every year starting at age 28.  Flexible sigmoidoscopy or colonoscopy. You may have a sigmoidoscopy every 5 years or a colonoscopy every 10 years starting at age 8.  Prostate cancer screening. Recommendations will vary depending on your family history and other risks.  Hepatitis C blood test.  Hepatitis B blood test.  Sexually transmitted disease (STD) testing.  Diabetes screening. This is done by checking your blood sugar (glucose) after you have not eaten for a while (fasting). You may have this done every 1-3 years.  Abdominal aortic aneurysm (AAA) screening. You may need this if you are a current or former smoker.  Osteoporosis. You may be screened starting at age 89 if you are at high risk. Talk with your health care provider about your test results, treatment options, and if necessary, the need for more tests.  Vaccines  Your health care provider may recommend certain vaccines, such as:  Influenza vaccine. This is recommended every year.  Tetanus, diphtheria, and acellular pertussis (Tdap, Td) vaccine. You may need a Td booster every 10  years.  Zoster vaccine. You may need this after age 11.  Pneumococcal 13-valent conjugate (PCV13) vaccine. One dose is recommended after age 54.  Pneumococcal polysaccharide (PPSV23) vaccine. One dose is recommended after age 33. Talk to your health care provider about which screenings and vaccines you need and how often you need them. This information is not intended to replace advice given to you by your health care provider. Make sure you discuss any questions you have with your health care provider. Document Released: 02/11/2015 Document Revised: 10/05/2015 Document Reviewed: 11/16/2014 Elsevier Interactive Patient Education  2017 [employer] Prevention in the Home Falls can cause injuries. They can happen to people of all ages. There are many things you can do to make your home safe and to help prevent falls. What can I do on the outside of my home?  Regularly fix the edges of walkways and driveways and fix any cracks.  Remove anything that might make you trip as you walk through a door, such as a raised step or threshold.  Trim any bushes or trees on the path to your home.  Use bright outdoor lighting.  Clear any walking paths of anything that might make someone trip, such as rocks or tools.  Regularly check to see if handrails are loose or broken. Make sure that both sides of any steps have handrails.  Any raised decks and porches should have guardrails on the edges.  Have any leaves, snow, or ice cleared regularly.  Use sand or salt on walking paths during winter.  Clean up any spills in your garage right away. This includes oil or grease spills. What can I do in the bathroom?  Use night lights.  Install grab bars by the toilet and in the tub and shower. Do not use towel bars as grab bars.  Use non-skid mats or decals in the tub or shower.  If you need to sit down in the shower, use a plastic, non-slip stool.  Keep the floor dry. Clean up any water that  spills on the floor as soon as it happens.  Remove soap buildup in the tub or shower regularly.  Attach bath mats securely with double-sided non-slip rug tape.  Do not have throw rugs and other things on the floor that can make you trip. What can I do in the bedroom?  Use night lights.  Make sure that you have a light by your bed that is easy to reach.  Do not use any sheets or blankets that are too big for your bed. They should not hang down onto the floor.  Have a firm chair that has side arms. You can use this for support while you get dressed.  Do not have throw rugs and other things on the floor that can make you trip. What can I do in the kitchen?  Clean up any spills right away.  Avoid walking on wet floors.  Keep items that you use a lot in easy-to-reach places.  If you need to reach something above you, use a strong step stool that has a grab bar.  Keep electrical cords out of the way.  Do not use floor polish or wax that makes floors slippery. If you must use wax, use non-skid floor wax.  Do not have throw rugs and other things on the floor that can make you trip. What can I do with my stairs?  Do not leave any items on the stairs.  Make sure that there are handrails on both sides of the stairs and use them. Fix handrails that are broken or loose. Make sure that handrails are as long as the stairways.  Check any carpeting to make sure that it is firmly attached to the stairs. Fix any carpet that is loose or worn.  Avoid having throw rugs at the top or bottom of the stairs. If you do have throw rugs, attach them to the floor with carpet tape.  Make sure that you have a light switch at the top of the stairs and the bottom of the stairs. If you do not have them, ask someone to add them for you. What else can I do to help prevent falls?  Wear shoes that:  Do not have high heels.  Have rubber bottoms.  Are comfortable and fit you well.  Are closed at the  toe. Do not wear sandals.  If you use a stepladder:  Make sure that it is fully opened. Do not climb a closed stepladder.  Make sure that both sides of the stepladder are locked into place.  Ask someone to hold it for you, if possible.  Clearly mark and make sure that you can see:  Any grab bars or handrails.  First and last steps.  Where the edge of each step is.  Use tools that help you move around (mobility aids) if they are needed. These include:  Canes.  Walkers.  Scooters.  Crutches.  Turn on the lights when you go into a dark area. Replace any light bulbs as soon as they burn out.  Set up your furniture so you have a clear path. Avoid moving your furniture around.  If any of your floors are uneven, fix them.  If there are any pets around you, be aware of where they are.  Review your medicines with your doctor. Some medicines can make you feel dizzy. This can increase your chance of falling. Ask your doctor what other things that you can do to help prevent falls. This information is not intended to replace advice given to you by your health care provider. Make sure you discuss any questions you have with your health care provider. Document Released: 11/11/2008 Document Revised: 06/23/2015 Document Reviewed: 02/19/2014 Elsevier Interactive Patient Education  2017 [employer].

## 2020-07-13 ENCOUNTER — Ambulatory Visit (INDEPENDENT_AMBULATORY_CARE_PROVIDER_SITE_OTHER): Payer: Medicare Other | Admitting: Family Medicine

## 2020-07-13 ENCOUNTER — Encounter: Payer: Self-pay | Admitting: Family Medicine

## 2020-07-13 ENCOUNTER — Other Ambulatory Visit: Payer: Self-pay

## 2020-07-13 VITALS — BP 134/82 | HR 74 | Temp 97.6°F | Ht 70.0 in | Wt 244.0 lb

## 2020-07-13 DIAGNOSIS — I10 Essential (primary) hypertension: Secondary | ICD-10-CM | POA: Diagnosis not present

## 2020-07-13 DIAGNOSIS — Z87891 Personal history of nicotine dependence: Secondary | ICD-10-CM | POA: Diagnosis not present

## 2020-07-13 DIAGNOSIS — IMO0001 Reserved for inherently not codable concepts without codable children: Secondary | ICD-10-CM

## 2020-07-13 DIAGNOSIS — R972 Elevated prostate specific antigen [PSA]: Secondary | ICD-10-CM

## 2020-07-13 DIAGNOSIS — E1169 Type 2 diabetes mellitus with other specified complication: Secondary | ICD-10-CM | POA: Diagnosis not present

## 2020-07-13 DIAGNOSIS — E785 Hyperlipidemia, unspecified: Secondary | ICD-10-CM

## 2020-07-13 DIAGNOSIS — I2510 Atherosclerotic heart disease of native coronary artery without angina pectoris: Secondary | ICD-10-CM

## 2020-07-13 DIAGNOSIS — K760 Fatty (change of) liver, not elsewhere classified: Secondary | ICD-10-CM | POA: Diagnosis not present

## 2020-07-13 DIAGNOSIS — E118 Type 2 diabetes mellitus with unspecified complications: Secondary | ICD-10-CM | POA: Diagnosis not present

## 2020-07-13 DIAGNOSIS — C4499 Other specified malignant neoplasm of skin, unspecified: Secondary | ICD-10-CM

## 2020-07-13 DIAGNOSIS — R911 Solitary pulmonary nodule: Secondary | ICD-10-CM | POA: Diagnosis not present

## 2020-07-13 DIAGNOSIS — M545 Low back pain, unspecified: Secondary | ICD-10-CM | POA: Diagnosis not present

## 2020-07-13 DIAGNOSIS — G4733 Obstructive sleep apnea (adult) (pediatric): Secondary | ICD-10-CM | POA: Diagnosis not present

## 2020-07-13 DIAGNOSIS — Z0000 Encounter for general adult medical examination without abnormal findings: Secondary | ICD-10-CM | POA: Diagnosis not present

## 2020-07-13 DIAGNOSIS — G8929 Other chronic pain: Secondary | ICD-10-CM

## 2020-07-13 DIAGNOSIS — J432 Centrilobular emphysema: Secondary | ICD-10-CM

## 2020-07-13 DIAGNOSIS — I700 Atherosclerosis of aorta: Secondary | ICD-10-CM

## 2020-07-13 NOTE — Assessment & Plan Note (Signed)
Preventative protocols reviewed and updated unless pt declined. Discussed healthy diet and lifestyle.  

## 2020-07-13 NOTE — Progress Notes (Signed)
Patient ID: Casey Golden, male    DOB: 08-Oct-1950, 70 y.o.   MRN: [id number]  This visit was conducted in person.  BP 134/82   Pulse 74   Temp 97.6 F (36.4 C) (Temporal)   Ht _0  (1.778 m)   Wt 244 lb (110.7 kg)   SpO2 97%   BMI 35.01 kg/m    CC: CPE Subjective:   HPI: Casey Golden is a 70 y.o. male presenting on 07/13/2020 for Annual Exam   Saw health advisor last week for medicare wellness visit. Note reviewed.   Continues working part time, regular walking.   Hearing Screening - Comments:: Has had hear aids but didn't like them and returned them Vision Screening - Comments:: Patient is going to call and set up an appointment to have his eyes checked.   Flowsheet Row Clinical Support from 07/06/2020 in [place] at [place]  PHQ-2 Total Score 0       Fall Risk  07/06/2020 05/27/2019 05/21/2018 11/01/2017 07/01/2017  Falls in the past year? 0 0 0 No No  Number falls in past yr: 0 - - - -  Injury with Fall? 0 - - - -  Risk for fall due to : Medication side effect - - - -  Follow up Falls evaluation completed;Falls prevention discussed - - - -     S/p surgical excision 12/2018 of L perineal extramammary paget's disease. Subsequent panCT reassuring, only noted incidental mild centrilobular emphysema as well as 24m LLL nodule (rec rpt CT 1 yr give fmhx lung cancer) and 1.4cm L adrenal adenoma and bilateral renal cysts.   Preventative: COLONOSCOPY WITH PROPOFOL 04/17/2016 TAs, high grade dysplasia with margins clear, diverticulosis (Lucilla Lame MD) COLONOSCOPY WITH PROPOFOL 08/07/2016 TAx1, diverticulosis, rpt 3 yrs (Wohl) COLONOSCOPY WITH PROPOFOL - 01/05/2020 SSP, rpt 5 yrs (Allen Norris Darren, MD) Prostate cancer screening - [place] [facility] watching elevated PSA. Last seen late 2020. Pt states he had biopsy but doesn't remember results. Last saw uro 1 yr ago - overdue for f/u  Lung cancer screening - not eligible  Flu shot - yearly [name] 03/2019 x2, booster 10/2019, 06/2020 Td 2017, Tdap 05/2018 Pneumovax 2013, prevnar13 2017 zostavax - 08/2012 shingrix - 08/2017, 11/2017 Advanced directive discussion - scanned and in chart 12/2016. Does not want prolonged life support if terminal condition. No HCPOA form filled out. Would want wife [name] daughter [name] be HCPOA.  Seat belt use discussed.  Sunscreen use discussed, no changing moles on skin. Sees derm yearly.  Ex smoker quit 1992 Alcohol - none  Dentist yearly - has upper and lower partial dentures Eye exam yearly  Bowel - no constipation - good fiber in diet.  Bladder - no incontinence.   Lives with wife, dog and cats Occupation: retired, was self employed, now works at [employer]: HS Activity: walks 1.5 mi daily Diet: some water, fruits/vegetables daily     Relevant past medical, surgical, family and social history reviewed and updated as indicated. Interim medical history since our last visit reviewed. Allergies and medications reviewed and updated. Outpatient Medications Prior to Visit  Medication Sig Dispense Refill   aspirin 81 MG EC tablet Take 1 tablet (81 mg total) by mouth daily. 30 tablet 12   atenolol (TENORMIN) 25 MG tablet Take 1 tablet (25 mg total) by mouth daily. 90 tablet 3   atorvastatin (LIPITOR) 40 MG tablet TAKE 1 TABLET BY MOUTH  DAILY 90 tablet 1   Blood Glucose Monitoring Suppl (BLOOD GLUCOSE MONITOR SYSTEM) w/Device KIT 1 each by Does not apply route daily. Use as directed to check blood sugar once daily 1 each 0   diclofenac sodium (VOLTAREN) 1 % GEL APPLY 2 G TOPICALLY 3 (THREE) TIMES DAILY AS NEEDED (ANTI INFLAMMATORY). 100 g 1   gabapentin (NEURONTIN) 300 MG capsule TAKE 1 CAPSULE BY MOUTH 3  TIMES DAILY 270 capsule 3   Glucose Blood (BLOOD GLUCOSE TEST STRIPS) STRP 1 each by In Vitro route daily. Use as directed to check blood sugar once daily 100 each 0   ibuprofen (ADVIL,MOTRIN) 200 MG tablet Take 400-600 mg by mouth  every 6 (six) hours as needed for mild pain (DEPENDS ON PAIN IF TAKES 400-600 MG).      Lancets MISC 1 each by Does not apply route daily. Use as directed to check blood sugar daily 100 each 0   metFORMIN (GLUCOPHAGE) 1000 MG tablet TAKE 1 TABLET BY MOUTH  TWICE DAILY WITH A MEAL 180 tablet 1   nitroGLYCERIN (NITROSTAT) 0.4 MG SL tablet Place 1 tablet (0.4 mg total) under the tongue every 5 (five) minutes as needed for chest pain (do not take more than 3 doses.). 75 tablet 1   No facility-administered medications prior to visit.     Per HPI unless specifically indicated in ROS section below Review of Systems  Constitutional:  Negative for activity change, appetite change, chills, fatigue, fever and unexpected weight change.  HENT:  Negative for hearing loss.   Eyes:  Negative for visual disturbance.  Respiratory:  Negative for cough, chest tightness, shortness of breath and wheezing.   Cardiovascular:  Negative for chest pain, palpitations and leg swelling.  Gastrointestinal:  Positive for constipation. Negative for abdominal distention, abdominal pain, blood in stool, diarrhea, nausea and vomiting.  Genitourinary:  Negative for difficulty urinating and hematuria.  Musculoskeletal:  Negative for arthralgias, myalgias and neck pain.  Skin:  Negative for rash.  Neurological:  Negative for dizziness, seizures, syncope and headaches.  Hematological:  Negative for adenopathy. Does not bruise/bleed easily.  Psychiatric/Behavioral:  Negative for dysphoric mood. The patient is not nervous/anxious.   Objective:  BP 134/82   Pulse 74   Temp 97.6 F (36.4 C) (Temporal)   Ht _0  (1.778 m)   Wt 244 lb (110.7 kg)   SpO2 97%   BMI 35.01 kg/m   Wt Readings from Last 3 Encounters:  07/13/20 244 lb (110.7 kg)  05/02/20 246 lb (111.6 kg)  01/05/20 254 lb (115.2 kg)      Physical Exam Vitals and nursing note reviewed.  Constitutional:      General: He is not in acute distress.    Appearance:  Normal appearance. He is well-developed. He is not ill-appearing.  HENT:     Head: Normocephalic and atraumatic.     Right Ear: Hearing, tympanic membrane, ear canal and external ear normal.     Left Ear: Hearing, tympanic membrane, ear canal and external ear normal.  Eyes:     General: No scleral icterus.    Extraocular Movements: Extraocular movements intact.     Conjunctiva/sclera: Conjunctivae normal.     Pupils: Pupils are equal, round, and reactive to light.  Neck:     Thyroid: No thyroid mass or thyromegaly.     Vascular: No carotid bruit.  Cardiovascular:     Rate and Rhythm: Normal rate and regular rhythm.     Pulses: Normal pulses.  Radial pulses are 2+ on the right side and 2+ on the left side.     Heart sounds: Normal heart sounds. No murmur heard. Pulmonary:     Effort: Pulmonary effort is normal. No respiratory distress.     Breath sounds: Normal breath sounds. No wheezing, rhonchi or rales.  Abdominal:     General: Abdomen is protuberant. Bowel sounds are normal. There is no distension.     Palpations: Abdomen is soft. There is no mass.     Tenderness: There is no abdominal tenderness. There is no guarding or rebound.     Hernia: No hernia is present.  Musculoskeletal:        General: Normal range of motion.     Cervical back: Normal range of motion and neck supple.     Right lower leg: No edema.     Left lower leg: No edema.  Lymphadenopathy:     Cervical: No cervical adenopathy.  Skin:    General: Skin is warm and dry.     Findings: No rash.  Neurological:     General: No focal deficit present.     Mental Status: He is alert and oriented to person, place, and time.  Psychiatric:        Mood and Affect: Mood normal.        Behavior: Behavior normal.        Thought Content: Thought content normal.        Judgment: Judgment normal.      Results for orders placed or performed in visit on 07/05/20  PSA, total and free  Result Value Ref Range   PSA,  Total 3.0 < OR = 4.0 ng/mL   PSA, Free 0.7 ng/mL   PSA, % Free 23 (L) >25 % (calc)  CBC with Differential/Platelet  Result Value Ref Range   WBC 7.6 4.0 - 10.5 K/uL   RBC 4.77 4.22 - 5.81 Mil/uL   Hemoglobin 16.3 13.0 - 17.0 g/dL   HCT 46.9 39.0 - 52.0 %   MCV 98.4 78.0 - 100.0 fl   MCHC 34.8 30.0 - 36.0 g/dL   RDW 12.2 11.5 - 15.5 %   Platelets 233.0 150.0 - 400.0 K/uL   Neutrophils Relative % 48.2 43.0 - 77.0 %   Lymphocytes Relative 39.0 12.0 - 46.0 %   Monocytes Relative 8.1 3.0 - 12.0 %   Eosinophils Relative 4.0 0.0 - 5.0 %   Basophils Relative 0.7 0.0 - 3.0 %   Neutro Abs 3.7 1.4 - 7.7 K/uL   Lymphs Abs 3.0 0.7 - 4.0 K/uL   Monocytes Absolute 0.6 0.1 - 1.0 K/uL   Eosinophils Absolute 0.3 0.0 - 0.7 K/uL   Basophils Absolute 0.1 0.0 - 0.1 K/uL  Microalbumin / creatinine urine ratio  Result Value Ref Range   Microalb, Ur <0.7 0.0 - 1.9 mg/dL   Creatinine,U 36.2 mg/dL   Microalb Creat Ratio 1.9 0.0 - 30.0 mg/g  Hemoglobin A1c  Result Value Ref Range   Hgb A1c MFr Bld 6.5 4.6 - 6.5 %  Comprehensive metabolic panel  Result Value Ref Range   Sodium 140 135 - 145 mEq/L   Potassium 4.8 3.5 - 5.1 mEq/L   Chloride 101 96 - 112 mEq/L   CO2 23 19 - 32 mEq/L   Glucose, Bld 127 (H) 70 - 99 mg/dL   BUN 15 6 - 23 mg/dL   Creatinine, Ser 0.92 0.40 - 1.50 mg/dL   Total Bilirubin 0.9 0.2 - 1.2  mg/dL   Alkaline Phosphatase 73 39 - 117 U/L   AST 40 (H) 0 - 37 U/L   ALT 42 0 - 53 U/L   Total Protein 7.3 6.0 - 8.3 g/dL   Albumin 4.5 3.5 - 5.2 g/dL   GFR 83.30 >60.00 mL/min   Calcium 9.8 8.4 - 10.5 mg/dL  Lipid panel  Result Value Ref Range   Cholesterol 119 0 - 200 mg/dL   Triglycerides 283.0 (H) 0.0 - 149.0 mg/dL   HDL 30.90 (L) >39.00 mg/dL   VLDL 56.6 (H) 0.0 - 40.0 mg/dL   Total CHOL/HDL Ratio 4    NonHDL 88.16   LDL cholesterol, direct  Result Value Ref Range   Direct LDL 45.0 mg/dL   Assessment & Plan:  This visit occurred during the SARS-CoV-2 public health  emergency.  Safety protocols were in place, including screening questions prior to the visit, additional usage of staff PPE, and extensive cleaning of exam room while observing appropriate contact time as indicated for disinfecting solutions.   Problem List Items Addressed This Visit     HTN (hypertension)    Chronic, stable period on atenolol daily.        CAD (coronary artery disease), native coronary artery    Continue aspirin, statin, cards f/u.       Controlled diabetes mellitus type 2 with complications (HCC)    Chronic, stable on metformin - continue.  rec schedule eye exam as due - has had difficulty finding eye doctor that will take his insurance.        Dyslipidemia associated with type 2 diabetes mellitus (HCC)    Chronic, continue atorvastatin. LDL at goal, triglycerides high and HDL low - reviewed diet choices to improve triglyceride levels. Consider lovaza/vascepa.  The ASCVD Risk score Mikey Bussing DC Jr., et al., 2013) failed to calculate for the following reasons:   The valid total cholesterol range is 130 to 320 mg/dL        OSA (obstructive sleep apnea)    Continues CPAP, followed by neurology Rexene Alberts, Ward Givens)        Health maintenance examination - Primary    Preventative protocols reviewed and updated unless pt declined. Discussed healthy diet and lifestyle.        Severe obesity (BMI 35.0-39.9) with comorbidity ([place])    Encouraged ongoing healthy diet choices to affect sustainable weight loss. Activity limited by knee pain.        Fatty liver disease, nonalcoholic    H/o this. AST slightly elevated.  Liver normal on CT 2020       Chronic midline low back pain   Ex-smoker    Quit remotely. Does not qualify for lung cancer screening program.        Elevated PSA    Overdue for uro f/u - # provided to call and schedule appt. Latest PSA reassuring.        Extramammary Paget's disease    S/p excision 12/2018. Continue derm f/u.         Atherosclerosis of aorta (HCC)    Continue aspirin, statin.        Pulmonary nodule less than 6 cm determined by computed tomography of lung    Ordered f/u CT scan.        Relevant Orders   CT CHEST NODULE FOLLOW UP LOW DOSE W/O   Centrilobular emphysema ([place])    By imaging. Asxs.          No orders of the defined  types were placed in this encounter.  Orders Placed This Encounter  Procedures   CT CHEST NODULE FOLLOW UP LOW DOSE W/O    Standing Status:   Future    Standing Expiration Date:   07/13/2021    Order Specific Question:   Preferred imaging location?    Answer:   [facility]    Patient instructions: Call to schedule follow up with Dr Junious Silk [facility] urology). [phone] [phone].  We will schedule CT scan to follow previous nodule seen last imaging study.  Schedule eye exam as you're due.  Good to see you today.  Return as needed or in 6 months for follow up visit.   Follow up plan: Return in about 6 months (around 01/12/2021) for follow up visit.  Ria Bush, MD

## 2020-07-13 NOTE — Patient Instructions (Addendum)
Call to schedule follow up with Dr Junious Silk [facility] urology). [phone] [phone].  We will schedule CT scan to follow previous nodule seen last imaging study.  Schedule eye exam as you're due.  Good to see you today.  Return as needed or in 6 months for follow up visit.   Health Maintenance After Age 72 After age 23, you are at a higher risk for certain long-term diseases and infections as well as injuries from falls. Falls are a major cause of broken bones and head injuries in people who are older than age 16. Getting regular preventive care can help to keep you healthy and well. Preventive care includes getting regular testing and making lifestyle changes as recommended by your health care provider. Talk with your health care provider about: Which screenings and tests you should have. A screening is a test that checks for a disease when you have no symptoms. A diet and exercise plan that is right for you. What should I know about screenings and tests to prevent falls? Screening and testing are the best ways to find a health problem early. Early diagnosis and treatment give you the best chance of managing medical conditions that are common after age 33. Certain conditions and lifestyle choices may make you more likely to have a fall. Your health care provider may recommend: Regular vision checks. Poor vision and conditions such as cataracts can make you more likely to have a fall. If you wear glasses, make sure to get your prescription updated if your vision changes. Medicine review. Work with your health care provider to regularly review all of the medicines you are taking, including over-the-counter medicines. Ask your health care provider about any side effects that may make you more likely to have a fall. Tell your health care provider if any medicines that you take make you feel dizzy or sleepy. Osteoporosis screening. Osteoporosis is a condition that causes the bones to get weaker. This can make the  bones weak and cause them to break more easily. Blood pressure screening. Blood pressure changes and medicines to control blood pressure can make you feel dizzy. Strength and balance checks. Your health care provider may recommend certain tests to check your strength and balance while standing, walking, or changing positions. Foot health exam. Foot pain and numbness, as well as not wearing proper footwear, can make you more likely to have a fall. Depression screening. You may be more likely to have a fall if you have a fear of falling, feel emotionally low, or feel unable to do activities that you used to do. Alcohol use screening. Using too much alcohol can affect your balance and may make you more likely to have a fall. What actions can I take to lower my risk of falls? General instructions Talk with your health care provider about your risks for falling. Tell your health care provider if: You fall. Be sure to tell your health care provider about all falls, even ones that seem minor. You feel dizzy, sleepy, or off-balance. Take over-the-counter and prescription medicines only as told by your health care provider. These include any supplements. Eat a healthy diet and maintain a healthy weight. A healthy diet includes low-fat dairy products, low-fat (lean) meats, and fiber from whole grains, beans, and lots of fruits and vegetables. Home safety Remove any tripping hazards, such as rugs, cords, and clutter. Install safety equipment such as grab bars in bathrooms and safety rails on stairs. Keep rooms and walkways well-lit. Activity  Follow  a regular exercise program to stay fit. This will help you maintain your balance. Ask your health care provider what types of exercise are appropriate for you. If you need a cane or walker, use it as recommended by your health care provider. Wear supportive shoes that have nonskid soles.  Lifestyle Do not drink alcohol if your health care provider tells you  not to drink. If you drink alcohol, limit how much you have: 0-1 drink a day for women. 0-2 drinks a day for men. Be aware of how much alcohol is in your drink. In the [place] one drink equals one typical bottle of beer (12 oz), one-half glass of wine (5 oz), or one shot of hard liquor (1 oz). Do not use any products that contain nicotine or tobacco, such as cigarettes and e-cigarettes. If you need help quitting, ask your health care provider. Summary Having a healthy lifestyle and getting preventive care can help to protect your health and wellness after age 16. Screening and testing are the best way to find a health problem early and help you avoid having a fall. Early diagnosis and treatment give you the best chance for managing medical conditions that are more common for people who are older than age 25. Falls are a major cause of broken bones and head injuries in people who are older than age 43. Take precautions to prevent a fall at home. Work with your health care provider to learn what changes you can make to improve your health and wellness and to prevent falls. This information is not intended to replace advice given to you by your health care provider. Make sure you discuss any questions you have with your healthcare provider. Document Revised: 01/01/2020 Document Reviewed: 01/01/2020 Elsevier Patient Education  2022 [employer].

## 2020-07-14 NOTE — Assessment & Plan Note (Signed)
Overdue for uro f/u - # provided to call and schedule appt. Latest PSA reassuring.

## 2020-07-14 NOTE — Assessment & Plan Note (Signed)
Encouraged ongoing healthy diet choices to affect sustainable weight loss. Activity limited by knee pain.

## 2020-07-14 NOTE — Assessment & Plan Note (Signed)
By imaging. Asxs.

## 2020-07-14 NOTE — Assessment & Plan Note (Addendum)
Chronic, continue atorvastatin. LDL at goal, triglycerides high and HDL low - reviewed diet choices to improve triglyceride levels. Consider lovaza/vascepa.  The ASCVD Risk score Casey Bussing DC Jr., et al., 2013) failed to calculate for the following reasons:   The valid total cholesterol range is 130 to 320 mg/dL

## 2020-07-14 NOTE — Assessment & Plan Note (Addendum)
H/o this. AST slightly elevated.  Liver normal on CT 2020

## 2020-07-14 NOTE — Assessment & Plan Note (Signed)
S/p excision 12/2018. Continue derm f/u.

## 2020-07-14 NOTE — Assessment & Plan Note (Signed)
Quit remotely. Does not qualify for lung cancer screening program.

## 2020-07-14 NOTE — Assessment & Plan Note (Signed)
Continue aspirin, statin, cards f/u.

## 2020-07-14 NOTE — Assessment & Plan Note (Signed)
Continues CPAP, followed by neurology Rexene Alberts, Ward Givens)

## 2020-07-14 NOTE — Assessment & Plan Note (Signed)
Chronic, stable period on atenolol daily.

## 2020-07-14 NOTE — Assessment & Plan Note (Signed)
Ordered f/u CT scan.

## 2020-07-14 NOTE — Assessment & Plan Note (Signed)
Continue aspirin, statin.  

## 2020-07-14 NOTE — Assessment & Plan Note (Addendum)
Chronic, stable on metformin - continue.  rec schedule eye exam as due - has had difficulty finding eye doctor that will take his insurance.

## 2020-07-21 ENCOUNTER — Telehealth: Payer: Self-pay

## 2020-07-21 NOTE — Telephone Encounter (Signed)
Received faxed order from [employer] for DM therapeutic shoes.    Spoke with pt asking if he initiated request.  Confirms he did.  States he been getting them for yrs.  Placed form in Dr. Darnell Level box.

## 2020-07-22 NOTE — Telephone Encounter (Signed)
Faxed form.

## 2020-07-22 NOTE — Telephone Encounter (Signed)
Filled and in Lisa's box 

## 2020-08-17 ENCOUNTER — Telehealth: Payer: Self-pay | Admitting: Podiatry

## 2020-08-17 NOTE — Telephone Encounter (Signed)
Pt left message with [place] office asking for a call back about his diabetic shoes status.  I returned call and it looks like we got paperwork at the beginning of July and they should be shipping in the next week or so. I went ahead and scheduled pt for the [place] office to pick them up on 8.31

## 2020-09-10 ENCOUNTER — Emergency Department
Admission: EM | Admit: 2020-09-10 | Discharge: 2020-09-10 | Disposition: A | Discharge status: Home / Self Care | Payer: Medicare Other | Attending: Emergency Medicine | Admitting: Emergency Medicine

## 2020-09-10 ENCOUNTER — Emergency Department: Payer: Medicare Other

## 2020-09-10 ENCOUNTER — Other Ambulatory Visit: Payer: Self-pay

## 2020-09-10 DIAGNOSIS — E119 Type 2 diabetes mellitus without complications: Secondary | ICD-10-CM | POA: Diagnosis not present

## 2020-09-10 DIAGNOSIS — R410 Disorientation, unspecified: Secondary | ICD-10-CM | POA: Diagnosis not present

## 2020-09-10 DIAGNOSIS — Z7984 Long term (current) use of oral hypoglycemic drugs: Secondary | ICD-10-CM | POA: Insufficient documentation

## 2020-09-10 DIAGNOSIS — Z85038 Personal history of other malignant neoplasm of large intestine: Secondary | ICD-10-CM | POA: Diagnosis not present

## 2020-09-10 DIAGNOSIS — R079 Chest pain, unspecified: Secondary | ICD-10-CM | POA: Diagnosis not present

## 2020-09-10 DIAGNOSIS — Z79899 Other long term (current) drug therapy: Secondary | ICD-10-CM | POA: Insufficient documentation

## 2020-09-10 DIAGNOSIS — R519 Headache, unspecified: Secondary | ICD-10-CM | POA: Diagnosis not present

## 2020-09-10 DIAGNOSIS — Z87891 Personal history of nicotine dependence: Secondary | ICD-10-CM | POA: Diagnosis not present

## 2020-09-10 DIAGNOSIS — Z7982 Long term (current) use of aspirin: Secondary | ICD-10-CM | POA: Insufficient documentation

## 2020-09-10 DIAGNOSIS — I2510 Atherosclerotic heart disease of native coronary artery without angina pectoris: Secondary | ICD-10-CM | POA: Insufficient documentation

## 2020-09-10 DIAGNOSIS — I10 Essential (primary) hypertension: Secondary | ICD-10-CM | POA: Diagnosis not present

## 2020-09-10 DIAGNOSIS — R0789 Other chest pain: Secondary | ICD-10-CM | POA: Diagnosis not present

## 2020-09-10 DIAGNOSIS — R29818 Other symptoms and signs involving the nervous system: Secondary | ICD-10-CM | POA: Diagnosis not present

## 2020-09-10 LAB — CBC
HCT: 45.6 % (ref 39.0–52.0)
Hemoglobin: 16.2 g/dL (ref 13.0–17.0)
MCH: 34.7 pg — ABNORMAL HIGH (ref 26.0–34.0)
MCHC: 35.5 g/dL (ref 30.0–36.0)
MCV: 97.6 fL (ref 80.0–100.0)
Platelets: 216 10*3/uL (ref 150–400)
RBC: 4.67 MIL/uL (ref 4.22–5.81)
RDW: 11.9 % (ref 11.5–15.5)
WBC: 10.2 10*3/uL (ref 4.0–10.5)
nRBC: 0 % (ref 0.0–0.2)

## 2020-09-10 LAB — URINALYSIS, COMPLETE (UACMP) WITH MICROSCOPIC
Bacteria, UA: NONE SEEN
Bilirubin Urine: NEGATIVE
Glucose, UA: NEGATIVE mg/dL
Hgb urine dipstick: NEGATIVE
Ketones, ur: NEGATIVE mg/dL
Nitrite: NEGATIVE
Protein, ur: NEGATIVE mg/dL
Specific Gravity, Urine: 1.006 (ref 1.005–1.030)
Squamous Epithelial / HPF: NONE SEEN (ref 0–5)
pH: 7 (ref 5.0–8.0)

## 2020-09-10 LAB — BASIC METABOLIC PANEL
Anion gap: 11 (ref 5–15)
BUN: 17 mg/dL (ref 8–23)
CO2: 25 mmol/L (ref 22–32)
Calcium: 9.5 mg/dL (ref 8.9–10.3)
Chloride: 102 mmol/L (ref 98–111)
Creatinine, Ser: 0.83 mg/dL (ref 0.61–1.24)
GFR, Estimated: >60 mL/min (ref >60)
Glucose, Bld: 105 mg/dL — ABNORMAL HIGH (ref 70–99)
Potassium: 4.3 mmol/L (ref 3.5–5.1)
Sodium: 138 mmol/L (ref 135–145)

## 2020-09-10 LAB — TROPONIN I (HIGH SENSITIVITY): Troponin I (High Sensitivity): 5 ng/L (ref <18)

## 2020-09-10 MED ORDER — ACETAMINOPHEN 500 MG PO TABS
1000.0000 mg | ORAL_TABLET | Freq: Once | ORAL | Status: AC
  Administered 2020-09-10: 1000 mg via ORAL
  Filled 2020-09-10: qty 2

## 2020-09-10 NOTE — ED Notes (Signed)
Pt and wife updated on delay.

## 2020-09-10 NOTE — ED Triage Notes (Signed)
Pt to ER via [facility] with complaints of head "pressure" and centralized chest pressure that started last night. Reports that he started feeling badly last night and woke up feeling the same. Denies shortness of breath or recent illness. States he works at [employer] and is unsure if he has been around anyone with covid. Reports using a home covid test this morning and it was negative.

## 2020-09-10 NOTE — ED Notes (Signed)
Pt and patient's wife screened by MRI.

## 2020-09-10 NOTE — ED Notes (Signed)
Pt provided with urinal. Denies needing assistance. Family member at bedside. Call light within reach.

## 2020-09-10 NOTE — Discharge Instructions (Addendum)
The MRI of your brain was unremarkable.  No signs of stroke.  Please follow-up with your doctor this week for further monitoring of your symptoms.

## 2020-09-10 NOTE — ED Provider Notes (Signed)
[facility]  ____________________________________________   Event Date/Time   First MD Initiated Contact with Patient 09/10/20 1321     (approximate)  I have reviewed the triage vital signs and the nursing notes.   HISTORY  Chief Complaint Chest Pain    HPI Casey Golden is a 70 y.o. male hx CAD, hypertension, hyperlipidemia, OSA on CPAP who presents with headache and chest pain.  Patient symptoms started last night.  He primarily endorses a pressure-like sensation in the back of his head.  It was gradual in onset and not maximal in onset.  Has waxed and waned since it started.  Is actually improving at this time.  He also notes some midsternal chest pressure that is intermittent, not exertional not associated with other symptoms.  He denies pain radiation to the back.  Patient when questioned notes that the right leg feels heavier than the left.  This is new for him.  He denies any visual change or numbness.  His wife notes that today when they went to urgent care he was having some difficulty getting his words out and the nurse was very concerned and told him to come to the ER.  His wife also notes that he has been a little confused and seems somewhat slower over the past day.     Past Medical History:  Diagnosis Date   Arthritis    CAD (coronary artery disease)    cathx3 with nonobstructive disease. Last cath with RCA 40% stenosis in 2009   Cancer [facility])    Cataract 2019   bilateral; resolved with surgery   Colonic polyp    Fatty liver disease, nonalcoholic 0768   by [place]   GERD (gastroesophageal reflux disease)    History of chicken pox    Hyperlipidemia    Hypertension    Nocturia    OSA (obstructive sleep apnea)    CPAP, compliant   Paget's disease of bony pelvis    Past use of tobacco    Quit 1990, 70 pack year history   Rash of genital area    09-08-2014  per pt Dr Junious Silk aware   Seasonal and perennial allergic rhinitis    Type 2  diabetes mellitus ([place])    Wears dentures    full upper/  partial lower   Wears glasses     Patient Active Problem List   Diagnosis Date Noted   Porokeratosis 05/16/2020   Plantar flexed metatarsal, left 05/16/2020   Plantar flexed metatarsal, right 05/16/2020   Polyp of transverse colon    Medicare annual wellness visit, subsequent 05/27/2019   Centrilobular emphysema ([place]) 02/05/2019   Pulmonary nodule less than 6 cm determined by computed tomography of lung 11/06/2018   Atherosclerosis of aorta ([place]) 11/05/2018   Extramammary Paget's disease 10/24/2018   Pain due to onychomycosis of toenails of both feet 07/31/2018   Osteoarthritis of knee 05/23/2017   Cerumen impaction 05/20/2017   Ex-smoker 03/27/2017   Elevated PSA 03/27/2017   Stress due to family tension 05/15/2016   Skin rash 05/15/2016   Hx of colonic polyps    Benign neoplasm of transverse colon    Polyp of sigmoid colon    Benign neoplasm of descending colon    Chronic midline low back pain 02/27/2016   Bilateral hip pain 11/02/2015   Right knee pain 07/12/2015   Health maintenance examination 03/07/2015   Severe obesity (BMI 35.0-39.9) with comorbidity ([place]) 03/07/2015   Advanced care planning/counseling discussion 03/07/2015  Controlled diabetes mellitus type 2 with complications ([place]) 99/77/4142   Dyslipidemia associated with type 2 diabetes mellitus ([place]) 05/06/2014   OSA (obstructive sleep apnea) 05/06/2014   Fatty liver disease, nonalcoholic 39/53/2023   HTN (hypertension) 02/15/2009   CAD (coronary artery disease), native coronary artery 02/15/2009   Shortness of breath 02/15/2009    Past Surgical History:  Procedure Laterality Date   ABDOMINAL HERNIA REPAIR  2007      [facility]   open repair   APPENDECTOMY  age 51   CARDIAC CATHETERIZATION  12-23-2007   [facility]   Abnormal myoview w/ ischemia/  40% mRCA with nonobstructive and no sig. plaque in his left system, EF 55%   CARDIAC CATHETERIZATION  Apr 2008     [facility]   Abnormal myoview/  50% RCA,  ef 65%   CARDIAC CATHETERIZATION  1999      [facility]   CATARACT EXTRACTION, BILATERAL Bilateral 11/2017   CERVICAL FUSION  1992   CHOLECYSTECTOMY OPEN  2006   COLONOSCOPY WITH PROPOFOL N/A 04/17/2016   TAs, high grade dysplasia with margins clear, diverticulosis Lucilla Lame, MD)   COLONOSCOPY WITH PROPOFOL N/A 08/07/2016   TAx1, diverticulosis, rpt 3 yrs ([place])   COLONOSCOPY WITH PROPOFOL N/A 01/05/2020   SSP, rpt Allen Norris, Darren, MD)   DENTAL SURGERY     metal dental implant L mandible   EXCISION OF SKIN TAG Right 09/14/2014   Procedure: EXCISION OF SKIN TAG;  Surgeon: Festus Aloe, MD;  Location: [facility];  Service: Urology;  Laterality: Right;   GROIN MASS OPEN BIOPSY Left 01/17/2019   HYDROCELE EXCISION Left 09/14/2014   Procedure: LEFT HYDROCELECTOMY ADULT;  Surgeon: Festus Aloe, MD;  Location: [facility];  Service: Urology;  Laterality: Left;   MOHS SURGERY  2015   skin cancer   TONSILLECTOMY  age 15    Prior to Admission medications   Medication Sig Start Date End Date Taking? Authorizing Provider  aspirin 81 MG EC tablet Take 1 tablet (81 mg total) by mouth daily. 09/17/14   Festus Aloe, MD  atenolol (TENORMIN) 25 MG tablet Take 1 tablet (25 mg total) by mouth daily. 05/02/20   Minna Merritts, MD  atorvastatin (LIPITOR) 40 MG tablet TAKE 1 TABLET BY MOUTH  DAILY 06/06/20   Ria Bush, MD  Blood Glucose Monitoring Suppl (BLOOD GLUCOSE MONITOR SYSTEM) w/Device KIT 1 each by Does not apply route daily. Use as directed to check blood sugar once daily 07/24/17   Ria Bush, MD  diclofenac sodium (VOLTAREN) 1 % GEL APPLY 2 G TOPICALLY 3 (THREE) TIMES DAILY AS NEEDED (ANTI INFLAMMATORY). 07/24/17   Ria Bush, MD  gabapentin (NEURONTIN) 300 MG capsule TAKE 1 CAPSULE BY MOUTH 3  TIMES DAILY 06/07/20   Ria Bush, MD  Glucose Blood (BLOOD GLUCOSE TEST STRIPS) STRP 1 each by In Vitro  route daily. Use as directed to check blood sugar once daily 07/24/17   Ria Bush, MD  ibuprofen (ADVIL,MOTRIN) 200 MG tablet Take 400-600 mg by mouth every 6 (six) hours as needed for mild pain (DEPENDS ON PAIN IF TAKES 400-600 MG).     [provider]  Lancets MISC 1 each by Does not apply route daily. Use as directed to check blood sugar daily 07/24/17   Ria Bush, MD  metFORMIN (GLUCOPHAGE) 1000 MG tablet TAKE 1 TABLET BY MOUTH  TWICE DAILY WITH A MEAL 06/06/20   Ria Bush, MD  nitroGLYCERIN (NITROSTAT) 0.4 MG SL tablet Place 1  tablet (0.4 mg total) under the tongue every 5 (five) minutes as needed for chest pain (do not take more than 3 doses.). 06/05/19 09/03/19  Loel Dubonnet, NP    Allergies Elemental sulfur and Sulfa antibiotics  Family History  Problem Relation Age of Onset   CAD Mother        MI   Diabetes Mother    Stroke Mother        mini-stroke   Cancer Father 32       lung (smoker)   Heart disease Paternal Grandmother    Heart disease Paternal Grandfather    Diabetes Paternal Grandfather    Cancer Sister        lung   Coronary artery disease Neg Hx        Premature    Social History Social History   Tobacco Use   Smoking status: Former    Packs/day: 2.00    Years: 35.00    Pack years: 70.00    Types: Cigarettes    Quit date: 01/29/1990    Years since quitting: 30.6   Smokeless tobacco: Never  Vaping Use   Vaping Use: Never used  Substance Use Topics   Alcohol use: No   Drug use: No    Review of Systems   Review of Systems  Constitutional:  Positive for fatigue. Negative for chills and fever.  Respiratory:  Negative for shortness of breath.   Cardiovascular:  Positive for chest pain.  Gastrointestinal:  Negative for abdominal pain, nausea and vomiting.  Neurological:  Positive for weakness and headaches. Negative for dizziness and light-headedness.  Psychiatric/Behavioral:  Positive for confusion.   All other systems  reviewed and are negative.  Physical Exam Updated Vital Signs BP 124/62   Pulse 67   Temp 98 F (36.7 C) (Oral)   Resp 15   Ht 5' 10"  (1.778 m)   Wt 109.8 kg   SpO2 95%   BMI 34.72 kg/m   Physical Exam Vitals and nursing note reviewed.  Constitutional:      General: He is not in acute distress.    Appearance: Normal appearance.  HENT:     Head: Normocephalic and atraumatic.  Eyes:     General: No scleral icterus.    Conjunctiva/sclera: Conjunctivae normal.  Cardiovascular:     Heart sounds: Normal heart sounds.  Pulmonary:     Effort: Pulmonary effort is normal. No respiratory distress.     Breath sounds: Normal breath sounds. No wheezing.  Musculoskeletal:        General: No deformity or signs of injury.     Cervical back: Normal range of motion.     Right lower leg: No edema.     Left lower leg: No edema.  Skin:    Coloration: Skin is not jaundiced or pale.  Neurological:     Mental Status: He is alert. Mental status is at baseline.     Comments: Patient is alert and oriented x3, his speech is normal although he is somewhat slowed Pupils are equal round and reactive, extraocular movements are normal face is symmetric No pronator drift 4 out of 5 strength in the right lower extremity, 5 out of 5 strength in the left lower extremity Sensation grossly intact in the bilateral upper and lower extremities Finger-nose-finger intact Stable gait no ataxia  Psychiatric:        Mood and Affect: Mood normal.        Behavior: Behavior normal.  LABS (all labs ordered are listed, but only abnormal results are displayed)  Labs Reviewed  BASIC METABOLIC PANEL - Abnormal; Notable for the following components:      Result Value   Glucose, Bld 105 (*)    All other components within normal limits  CBC - Abnormal; Notable for the following components:   MCH 34.7 (*)    All other components within normal limits  URINALYSIS, COMPLETE (UACMP) WITH MICROSCOPIC  TROPONIN I  (HIGH SENSITIVITY)   ____________________________________________  EKG  NSR, nml rate, prolonged PR interval  ____________________________________________  RADIOLOGY Almeta Monas, personally viewed and evaluated these images (plain radiographs) as part of my medical decision making, as well as reviewing the written report by the radiologist.  ED MD interpretation:  I reviewed the CXR which does not show any acute process     ____________________________________________   PROCEDURES  Procedure(s) performed (including Critical Care):  Procedures   ____________________________________________   INITIAL IMPRESSION / ASSESSMENT AND PLAN / ED COURSE     Patient is a 70 year old male who presents primarily with headache but was secondary complaint of chest pain.  What is concerning is that on exam he has some objective right lower extremity weakness, although subtle it is present.  Also with some apparent word finding difficulty earlier today.  The rest of his exam is reassuring.  His EKG is nonischemic.  Chest x-ray without infiltrate or other acute cardiopulmonary process.  His initial troponin is essentially normal.  CT head is obtained and is pending final read.  Will discuss with neurology as I am concerned for TIA versus CVA.  CT head showing enlarged ventricles; D/w Dr. Cheral Marker who recommends MRI. Pt pending MRI at the time of signout. Dispo pending imaging.       ____________________________________________   FINAL CLINICAL IMPRESSION(S) / ED DIAGNOSES  Final diagnoses:  Nonintractable headache, unspecified chronicity pattern, unspecified headache type     ED Discharge Orders     None        Note:  This document was prepared using Dragon voice recognition software and may include unintentional dictation errors.    Rada Hay, MD 09/10/20 [phone]

## 2020-09-12 ENCOUNTER — Telehealth: Payer: Self-pay | Admitting: Family Medicine

## 2020-09-12 NOTE — Telephone Encounter (Signed)
I spoke with pt; Seen [facility] ED on 09/10/20; pt was to FU with Dr Darnell Level in couple of days. Pt said got pneumonia shot on 09/09/20; face felt like on fire,H/a and CP that was dull pain on lt side of chest for not sure of duration of pain; on 09/09/20 H/A pain level was 7-8 on at nighttime.Today pt is feeling better. No weakness in arms or legs; today pt has very dull h/a at back of head.no dizziness, no CP or SOB today. No vision changes. Pt does not have a way to ck BP; BP machine is broken. Pt said he can come anytime if Dr Darnell Level could work him in. Pt said he has not gotten back MRI results yet.pt request cb when could have FU from ED visit. UC & ED precautions given and pt voiced understanding. Sending note to DR Baldwin Crown CMA. Also sending Teams note to [facility] as well.

## 2020-09-12 NOTE — Telephone Encounter (Signed)
May place tomorrow at 8am. Thanks.

## 2020-09-12 NOTE — Telephone Encounter (Signed)
Casey Golden called in and stated that he was in the hospital on Saturday and was suppose to Follow- Up w/ Dr. Darnell Level within 1-2 days.

## 2020-09-13 ENCOUNTER — Encounter: Payer: Self-pay | Admitting: Family Medicine

## 2020-09-13 ENCOUNTER — Ambulatory Visit (INDEPENDENT_AMBULATORY_CARE_PROVIDER_SITE_OTHER): Payer: Medicare Other | Admitting: Family Medicine

## 2020-09-13 ENCOUNTER — Other Ambulatory Visit: Payer: Self-pay

## 2020-09-13 VITALS — BP 140/78 | HR 58 | Temp 97.5°F | Ht 70.0 in | Wt 243.1 lb

## 2020-09-13 DIAGNOSIS — I679 Cerebrovascular disease, unspecified: Secondary | ICD-10-CM | POA: Diagnosis not present

## 2020-09-13 DIAGNOSIS — T50A95A Adverse effect of other bacterial vaccines, initial encounter: Secondary | ICD-10-CM | POA: Insufficient documentation

## 2020-09-13 DIAGNOSIS — I10 Essential (primary) hypertension: Secondary | ICD-10-CM

## 2020-09-13 NOTE — Patient Instructions (Addendum)
Buy BP cuff and start checking at home, BP log card provided today.  Goal blood pressure <140/90.  You don't need any more pneumonia shots for at least 10 years.  Have pharmacy print out a copy of your immunization records so we can compare to our records (mainly for pneumonia shots).

## 2020-09-13 NOTE — Assessment & Plan Note (Signed)
BP borderline elevated. Advised buy new BP cuff and start checking at home, BP log card provided. If staying high, low threshold to add antihypertensive (likely ACEI/ARB).

## 2020-09-13 NOTE — Progress Notes (Signed)
Patient ID: Casey Golden, male    DOB: Aug 27, 1950, 70 y.o.   MRN: [id number]  This visit was conducted in person.  BP 140/78   Pulse (!) 58   Temp (!) 97.5 F (36.4 C) (Temporal)   Ht 5' 10"  (1.778 m)   Wt 243 lb 1 oz (110.3 kg)   SpO2 96%   BMI 34.88 kg/m    CC: hosp f/u visit  Subjective:   HPI: Casey Golden is a 70 y.o. male presenting on 09/13/2020 for Hospitalization Follow-up (Seen on 09/10/20 at [facility] ED, dx HA. )   Recent ER visit for headache and chest pain - states symptoms started after he received Prevnar-20 vaccine 09/09/2020 at local pharmacy. He had already received Prevnar-13.   The night of the pneumonia shot, he started having facial burning sensation without fever/chills and head pressure at vertex of scalp. Also had chest pressure. Seen at [facility] - referred to ER due to noted confusion upon questioning. Never headache.   Pressure sensation to head continues.   No fevers/chills, itchy rash, hives, throat or tongue swelling, dyspnea, abd pain or nausea.  He's not been checking BP at home - cuff broke.   ER records reviewed - on physical exam had mild weakness to RLE so he had head CT done which showed prominent ventricles. Proceeded with MRI which returned reassuringly normal. Troponin neg x1.   MR brain without contrast 08/2020: No acute finding. Age related volume loss with mild to moderate chronic small-vessel ischemic change of the hemispheric white matter. I think ventricular size is more consistent with central atrophy rather than normal pressure hydrocephalus.  About a month ago had eye exam told may have double vision. He hasn't quite noticed this. Planning to return for second eye exam.      Relevant past medical, surgical, family and social history reviewed and updated as indicated. Interim medical history since our last visit reviewed. Allergies and medications reviewed and updated. Outpatient Medications Prior to Visit  Medication Sig Dispense  Refill   aspirin 81 MG EC tablet Take 1 tablet (81 mg total) by mouth daily. 30 tablet 12   atenolol (TENORMIN) 25 MG tablet Take 1 tablet (25 mg total) by mouth daily. 90 tablet 3   atorvastatin (LIPITOR) 40 MG tablet TAKE 1 TABLET BY MOUTH  DAILY 90 tablet 1   Blood Glucose Monitoring Suppl (BLOOD GLUCOSE MONITOR SYSTEM) w/Device KIT 1 each by Does not apply route daily. Use as directed to check blood sugar once daily 1 each 0   diclofenac sodium (VOLTAREN) 1 % GEL APPLY 2 G TOPICALLY 3 (THREE) TIMES DAILY AS NEEDED (ANTI INFLAMMATORY). 100 g 1   gabapentin (NEURONTIN) 300 MG capsule TAKE 1 CAPSULE BY MOUTH 3  TIMES DAILY 270 capsule 3   Glucose Blood (BLOOD GLUCOSE TEST STRIPS) STRP 1 each by In Vitro route daily. Use as directed to check blood sugar once daily 100 each 0   ibuprofen (ADVIL,MOTRIN) 200 MG tablet Take 400-600 mg by mouth every 6 (six) hours as needed for mild pain (DEPENDS ON PAIN IF TAKES 400-600 MG).      Lancets MISC 1 each by Does not apply route daily. Use as directed to check blood sugar daily 100 each 0   metFORMIN (GLUCOPHAGE) 1000 MG tablet TAKE 1 TABLET BY MOUTH  TWICE DAILY WITH A MEAL 180 tablet 1   nitroGLYCERIN (NITROSTAT) 0.4 MG SL tablet Place 1 tablet (0.4 mg total) under the tongue  every 5 (five) minutes as needed for chest pain (do not take more than 3 doses.). 75 tablet 1   No facility-administered medications prior to visit.     Per HPI unless specifically indicated in ROS section below Review of Systems  Objective:  BP 140/78   Pulse (!) 58   Temp (!) 97.5 F (36.4 C) (Temporal)   Ht 5' 10"  (1.778 m)   Wt 243 lb 1 oz (110.3 kg)   SpO2 96%   BMI 34.88 kg/m   Wt Readings from Last 3 Encounters:  09/13/20 243 lb 1 oz (110.3 kg)  09/10/20 242 lb (109.8 kg)  07/13/20 244 lb (110.7 kg)      Physical Exam Vitals and nursing note reviewed.  Constitutional:      Appearance: Normal appearance. He is obese. He is not ill-appearing.  Cardiovascular:      Rate and Rhythm: Normal rate and regular rhythm.     Pulses: Normal pulses.     Heart sounds: Normal heart sounds. No murmur heard. Pulmonary:     Effort: Pulmonary effort is normal. No respiratory distress.     Breath sounds: Normal breath sounds. No wheezing, rhonchi or rales.  Musculoskeletal:        General: Normal range of motion.     Right lower leg: No edema.     Left lower leg: No edema.  Skin:    General: Skin is warm and dry.     Findings: No rash.  Neurological:     Mental Status: He is alert.     Comments:  CN 2-12 intact FTN intact EOMI  5/5 strength BLE  Psychiatric:        Mood and Affect: Mood normal.        Behavior: Behavior normal.      Results for orders placed or performed during the hospital encounter of 20/25/42  Basic metabolic panel  Result Value Ref Range   Sodium 138 135 - 145 mmol/L   Potassium 4.3 3.5 - 5.1 mmol/L   Chloride 102 98 - 111 mmol/L   CO2 25 22 - 32 mmol/L   Glucose, Bld 105 (H) 70 - 99 mg/dL   BUN 17 8 - 23 mg/dL   Creatinine, Ser 0.83 0.61 - 1.24 mg/dL   Calcium 9.5 8.9 - 10.3 mg/dL   GFR, Estimated >60 >60 mL/min   Anion gap 11 5 - 15  CBC  Result Value Ref Range   WBC 10.2 4.0 - 10.5 K/uL   RBC 4.67 4.22 - 5.81 MIL/uL   Hemoglobin 16.2 13.0 - 17.0 g/dL   HCT 45.6 39.0 - 52.0 %   MCV 97.6 80.0 - 100.0 fL   MCH 34.7 (H) 26.0 - 34.0 pg   MCHC 35.5 30.0 - 36.0 g/dL   RDW 11.9 11.5 - 15.5 %   Platelets 216 150 - 400 K/uL   nRBC 0.0 0.0 - 0.2 %  Urinalysis, Complete w Microscopic Urine, Clean Catch  Result Value Ref Range   Color, Urine STRAW (A) YELLOW   APPearance CLEAR (A) CLEAR   Specific Gravity, Urine 1.006 1.005 - 1.030   pH 7.0 5.0 - 8.0   Glucose, UA NEGATIVE NEGATIVE mg/dL   Hgb urine dipstick NEGATIVE NEGATIVE   Bilirubin Urine NEGATIVE NEGATIVE   Ketones, ur NEGATIVE NEGATIVE mg/dL   Protein, ur NEGATIVE NEGATIVE mg/dL   Nitrite NEGATIVE NEGATIVE   Leukocytes,Ua TRACE (A) NEGATIVE   RBC / HPF 0-5 0  - 5 RBC/hpf  WBC, UA 0-5 0 - 5 WBC/hpf   Bacteria, UA NONE SEEN NONE SEEN   Squamous Epithelial / LPF NONE SEEN 0 - 5  Troponin I (High Sensitivity)  Result Value Ref Range   Troponin I (High Sensitivity) 5 <18 ng/L   Lab Results  Component Value Date   HGBA1C 6.5 07/05/2020    Assessment & Plan:  This visit occurred during the SARS-CoV-2 public health emergency.  Safety protocols were in place, including screening questions prior to the visit, additional usage of staff PPE, and extensive cleaning of exam room while observing appropriate contact time as indicated for disinfecting solutions.   Problem List Items Addressed This Visit     HTN (hypertension)    BP borderline elevated. Advised buy new BP cuff and start checking at home, BP log card provided. If staying high, low threshold to add antihypertensive (likely ACEI/ARB).      Adverse reaction to pneumococcal vaccine - Primary    Anticipate symptoms related to prevnar-20 adverse reaction given temporal relation of shot to symptoms. Not consistent with allergy however. Reviewing chart, it seems he may have been over-treated with pneumoccocal vaccines. He had previously received Prevnar-13 2017.  Rec against further pneumoccocal vaccination at this time.  Anticipate continued improvement in symptoms. Update if any recurrent symptoms.       Cerebrovascular small vessel disease    Evidence of small vessel ischemic changes on recent MRI along with cerebral atrophy - discussed with patient, encouraged tight blood pressure and cholesterol control.         No orders of the defined types were placed in this encounter.  No orders of the defined types were placed in this encounter.    Patient Instructions  Buy BP cuff and start checking at home, BP log card provided today.  Goal blood pressure <140/90.  You don't need any more pneumonia shots for at least 10 years.  Have pharmacy print out a copy of your immunization records so  we can compare to our records (mainly for pneumonia shots).   Follow up plan: Return if symptoms worsen or fail to improve.  Ria Bush, MD

## 2020-09-13 NOTE — Assessment & Plan Note (Signed)
Evidence of small vessel ischemic changes on recent MRI along with cerebral atrophy - discussed with patient, encouraged tight blood pressure and cholesterol control.

## 2020-09-13 NOTE — Assessment & Plan Note (Addendum)
Anticipate symptoms related to prevnar-20 adverse reaction given temporal relation of shot to symptoms. Not consistent with allergy however. Reviewing chart, it seems he may have been over-treated with pneumoccocal vaccines. He had previously received Prevnar-13 2017.  Rec against further pneumoccocal vaccination at this time.  Anticipate continued improvement in symptoms. Update if any recurrent symptoms.

## 2020-09-15 ENCOUNTER — Ambulatory Visit: Payer: Medicare Other | Admitting: Podiatry

## 2020-09-28 ENCOUNTER — Other Ambulatory Visit: Payer: Medicare Other

## 2020-09-29 NOTE — Progress Notes (Signed)
Patient presents to the office today for diabetic shoe and insole measuring.  Patient was measured with brannock device to determine size and width for 1 pair of extra depth shoes and foam casted for 3 pair of insoles.   Documentation of medical necessity will be sent to patient's treating diabetic doctor to verify and sign.   Patient's diabetic provider: mayer  Shoes and insoles will be ordered at that time and patient will be notified for an appointment for fitting when they arrive.   Shoe size (per patient):    Brannock measurement:   Patient shoe selection-   1st choice:   Apex [id number]  2nd choice:    Shoe size ordered: [id number]

## 2020-10-17 ENCOUNTER — Other Ambulatory Visit: Payer: Self-pay

## 2020-10-17 ENCOUNTER — Ambulatory Visit (INDEPENDENT_AMBULATORY_CARE_PROVIDER_SITE_OTHER): Payer: Medicare Other | Admitting: Podiatry

## 2020-10-17 DIAGNOSIS — M2042 Other hammer toe(s) (acquired), left foot: Secondary | ICD-10-CM | POA: Diagnosis not present

## 2020-10-17 DIAGNOSIS — M2012 Hallux valgus (acquired), left foot: Secondary | ICD-10-CM | POA: Diagnosis not present

## 2020-10-17 DIAGNOSIS — M2041 Other hammer toe(s) (acquired), right foot: Secondary | ICD-10-CM | POA: Diagnosis not present

## 2020-10-17 DIAGNOSIS — E1142 Type 2 diabetes mellitus with diabetic polyneuropathy: Secondary | ICD-10-CM | POA: Diagnosis not present

## 2020-10-17 DIAGNOSIS — M2011 Hallux valgus (acquired), right foot: Secondary | ICD-10-CM

## 2020-10-17 DIAGNOSIS — M216X2 Other acquired deformities of left foot: Secondary | ICD-10-CM

## 2020-10-17 DIAGNOSIS — M2030 Hallux varus (acquired), unspecified foot: Secondary | ICD-10-CM

## 2020-10-17 NOTE — Progress Notes (Signed)
[name] presented today to pick up his Diabetic shoes and inserts.  Wearing instructions were given and I assured that the shoes fit properly.  The Medicare DMEPOS Supplier Standards information was reviewed and given to the patient.   Gardiner Barefoot DPM

## 2020-10-18 LAB — HM DIABETES EYE EXAM

## 2020-10-21 ENCOUNTER — Encounter: Payer: Self-pay | Admitting: Family Medicine

## 2020-11-03 ENCOUNTER — Telehealth: Payer: Self-pay | Admitting: Family Medicine

## 2020-11-03 NOTE — Telephone Encounter (Signed)
I don't see any phn messages, lab/imaging results showing someone from our office tried to contact pt.    Closing encounter.

## 2020-11-03 NOTE — Telephone Encounter (Signed)
Pt returning call

## 2020-11-11 ENCOUNTER — Telehealth: Payer: Self-pay | Admitting: Family Medicine

## 2020-11-11 MED ORDER — LOSARTAN POTASSIUM 25 MG PO TABS: 25.0000 mg | ORAL_TABLET | Freq: Every day | ORAL | 6 refills | Status: DC

## 2020-11-11 NOTE — Telephone Encounter (Signed)
Spoke to patient by telephone and was advised that his blood pressure has been running a little high at times. Patient stated that he has written the readings down but can not find them now. Patient checked his blood pressure while on the phone and it was 166/94 heart rate 72. Patient denies any symptoms at this time. Patient was given ER precautions and he verbalized understanding..Patient wants to know if he should make any adjustments on his medications. Patient stated that he will continue to monitor his blood pressure readings and will report them back to Dr. Danise Mina.   Patient stated Dr. Darnell Level did a handicap placard form for him last year and it is going to expire the end of the month and he needs it renewed. Patient request that the paperwork be completed for him.  Patient requested that the handicap parking form be mailed to him.

## 2020-11-11 NOTE — Telephone Encounter (Signed)
Pt called in stating he would like to discuss some things with the provider:medication and blood pressure, handicap placard

## 2020-11-11 NOTE — Telephone Encounter (Addendum)
Spoke with pt relaying Dr. Synthia Innocent message.  Pt verbalizes understanding.  Also, pt says he incorrectly read the expiration date on Disability Parking placard.  It expires 10/2024.  Pt doesn't need new app at this time and offers his apologies.

## 2020-11-11 NOTE — Telephone Encounter (Signed)
Let's go ahead and start losartan 25mg  daily. I've sent this to pharmacy. Continue checking BP at home and update [place] with readings in another 2-3 wks, sooner if any trouble.  Handicap placard placed in Lisa's box.

## 2020-11-16 ENCOUNTER — Emergency Department: Payer: Medicare Other

## 2020-11-16 ENCOUNTER — Other Ambulatory Visit: Payer: Self-pay

## 2020-11-16 ENCOUNTER — Emergency Department
Admission: EM | Admit: 2020-11-16 | Discharge: 2020-11-16 | Disposition: A | Discharge status: Home / Self Care | Payer: Medicare Other | Attending: Emergency Medicine | Admitting: Emergency Medicine

## 2020-11-16 ENCOUNTER — Telehealth: Payer: Self-pay | Admitting: Family Medicine

## 2020-11-16 DIAGNOSIS — Z87891 Personal history of nicotine dependence: Secondary | ICD-10-CM | POA: Insufficient documentation

## 2020-11-16 DIAGNOSIS — Z7984 Long term (current) use of oral hypoglycemic drugs: Secondary | ICD-10-CM | POA: Diagnosis not present

## 2020-11-16 DIAGNOSIS — J329 Chronic sinusitis, unspecified: Secondary | ICD-10-CM | POA: Diagnosis not present

## 2020-11-16 DIAGNOSIS — W01198A Fall on same level from slipping, tripping and stumbling with subsequent striking against other object, initial encounter: Secondary | ICD-10-CM | POA: Diagnosis not present

## 2020-11-16 DIAGNOSIS — W19XXXA Unspecified fall, initial encounter: Secondary | ICD-10-CM

## 2020-11-16 DIAGNOSIS — E119 Type 2 diabetes mellitus without complications: Secondary | ICD-10-CM | POA: Diagnosis not present

## 2020-11-16 DIAGNOSIS — J439 Emphysema, unspecified: Secondary | ICD-10-CM | POA: Diagnosis not present

## 2020-11-16 DIAGNOSIS — Y9289 Other specified places as the place of occurrence of the external cause: Secondary | ICD-10-CM | POA: Insufficient documentation

## 2020-11-16 DIAGNOSIS — J449 Chronic obstructive pulmonary disease, unspecified: Secondary | ICD-10-CM | POA: Diagnosis not present

## 2020-11-16 DIAGNOSIS — R42 Dizziness and giddiness: Secondary | ICD-10-CM | POA: Diagnosis not present

## 2020-11-16 DIAGNOSIS — M25551 Pain in right hip: Secondary | ICD-10-CM | POA: Diagnosis not present

## 2020-11-16 DIAGNOSIS — I2510 Atherosclerotic heart disease of native coronary artery without angina pectoris: Secondary | ICD-10-CM | POA: Insufficient documentation

## 2020-11-16 DIAGNOSIS — Z85038 Personal history of other malignant neoplasm of large intestine: Secondary | ICD-10-CM | POA: Insufficient documentation

## 2020-11-16 DIAGNOSIS — M25561 Pain in right knee: Secondary | ICD-10-CM | POA: Diagnosis not present

## 2020-11-16 DIAGNOSIS — S060X0A Concussion without loss of consciousness, initial encounter: Secondary | ICD-10-CM | POA: Diagnosis not present

## 2020-11-16 DIAGNOSIS — Z7982 Long term (current) use of aspirin: Secondary | ICD-10-CM | POA: Diagnosis not present

## 2020-11-16 DIAGNOSIS — Y93H2 Activity, gardening and landscaping: Secondary | ICD-10-CM | POA: Insufficient documentation

## 2020-11-16 DIAGNOSIS — S199XXA Unspecified injury of neck, initial encounter: Secondary | ICD-10-CM | POA: Diagnosis not present

## 2020-11-16 DIAGNOSIS — I6529 Occlusion and stenosis of unspecified carotid artery: Secondary | ICD-10-CM | POA: Diagnosis not present

## 2020-11-16 DIAGNOSIS — I10 Essential (primary) hypertension: Secondary | ICD-10-CM | POA: Diagnosis not present

## 2020-11-16 DIAGNOSIS — Z79899 Other long term (current) drug therapy: Secondary | ICD-10-CM | POA: Insufficient documentation

## 2020-11-16 DIAGNOSIS — S0990XA Unspecified injury of head, initial encounter: Secondary | ICD-10-CM | POA: Diagnosis not present

## 2020-11-16 LAB — CBC WITH DIFFERENTIAL/PLATELET
Abs Immature Granulocytes: 0.04 10*3/uL (ref 0.00–0.07)
Basophils Absolute: 0.1 10*3/uL (ref 0.0–0.1)
Basophils Relative: 1 %
Eosinophils Absolute: 0.3 10*3/uL (ref 0.0–0.5)
Eosinophils Relative: 4 %
HCT: 44.1 % (ref 39.0–52.0)
Hemoglobin: 15.8 g/dL (ref 13.0–17.0)
Immature Granulocytes: 1 %
Lymphocytes Relative: 40 %
Lymphs Abs: 3 10*3/uL (ref 0.7–4.0)
MCH: 35 pg — ABNORMAL HIGH (ref 26.0–34.0)
MCHC: 35.8 g/dL (ref 30.0–36.0)
MCV: 97.8 fL (ref 80.0–100.0)
Monocytes Absolute: 0.6 10*3/uL (ref 0.1–1.0)
Monocytes Relative: 8 %
Neutro Abs: 3.6 10*3/uL (ref 1.7–7.7)
Neutrophils Relative %: 46 %
Platelets: 231 10*3/uL (ref 150–400)
RBC: 4.51 MIL/uL (ref 4.22–5.81)
RDW: 11.8 % (ref 11.5–15.5)
WBC: 7.6 10*3/uL (ref 4.0–10.5)
nRBC: 0 % (ref 0.0–0.2)

## 2020-11-16 LAB — BASIC METABOLIC PANEL
Anion gap: 7 (ref 5–15)
BUN: 14 mg/dL (ref 8–23)
CO2: 29 mmol/L (ref 22–32)
Calcium: 9.2 mg/dL (ref 8.9–10.3)
Chloride: 103 mmol/L (ref 98–111)
Creatinine, Ser: 0.78 mg/dL (ref 0.61–1.24)
GFR, Estimated: >60 mL/min (ref >60)
Glucose, Bld: 109 mg/dL — ABNORMAL HIGH (ref 70–99)
Potassium: 4.4 mmol/L (ref 3.5–5.1)
Sodium: 139 mmol/L (ref 135–145)

## 2020-11-16 NOTE — ED Provider Notes (Signed)
[facility] Emergency Department Provider Note   ____________________________________________   Event Date/Time   First MD Initiated Contact with Patient 11/16/20 1452     (approximate)  I have reviewed the triage vital signs and the nursing notes.   HISTORY  Chief Complaint Fall    HPI Casey Golden is a 70 y.o. male with past medical history of hypertension, hyperlipidemia, diabetes, CAD, COPD, and chronic back pain who presents to the ED following fall.  Patient reports that yesterday he was outside working with his pepper plants when he lost his balance and fell forward, striking his forehead.  He did not lose consciousness, but states throughout the day today he has been feeling weak and dizzy at times.  He denies any vision changes, speech changes, numbness, or weakness.  He denies any pain in his neck, chest, abdomen, or upper extremities.  He does endorse some mild pain in his right knee and hip, but states he has been able to walk without difficulty.        Past Medical History:  Diagnosis Date   Arthritis    CAD (coronary artery disease)    cathx3 with nonobstructive disease. Last cath with RCA 40% stenosis in 2009   Cancer [facility])    Cataract 2019   bilateral; resolved with surgery   Colonic polyp    Fatty liver disease, nonalcoholic 8127   by [place]   GERD (gastroesophageal reflux disease)    History of chicken pox    Hyperlipidemia    Hypertension    Nocturia    OSA (obstructive sleep apnea)    CPAP, compliant   Paget's disease of bony pelvis    Past use of tobacco    Quit 1990, 70 pack year history   Rash of genital area    09-08-2014  per pt Dr Junious Silk aware   Seasonal and perennial allergic rhinitis    Type 2 diabetes mellitus ([place])    Wears dentures    full upper/  partial lower   Wears glasses     Patient Active Problem List   Diagnosis Date Noted   Adverse reaction to pneumococcal vaccine 09/13/2020   Cerebrovascular  small vessel disease 09/13/2020   Porokeratosis 05/16/2020   Plantar flexed metatarsal, left 05/16/2020   Plantar flexed metatarsal, right 05/16/2020   Polyp of transverse colon    Medicare annual wellness visit, subsequent 05/27/2019   Centrilobular emphysema ([place]) 02/05/2019   Pulmonary nodule less than 6 cm determined by computed tomography of lung 11/06/2018   Atherosclerosis of aorta ([place]) 11/05/2018   Extramammary Paget's disease 10/24/2018   Pain due to onychomycosis of toenails of both feet 07/31/2018   Osteoarthritis of knee 05/23/2017   Cerumen impaction 05/20/2017   Ex-smoker 03/27/2017   Elevated PSA 03/27/2017   Stress due to family tension 05/15/2016   Skin rash 05/15/2016   Hx of colonic polyps    Benign neoplasm of transverse colon    Polyp of sigmoid colon    Benign neoplasm of descending colon    Chronic midline low back pain 02/27/2016   Bilateral hip pain 11/02/2015   Right knee pain 07/12/2015   Health maintenance examination 03/07/2015   Severe obesity (BMI 35.0-39.9) with comorbidity ([place]) 03/07/2015   Advanced care planning/counseling discussion 03/07/2015   Controlled diabetes mellitus type 2 with complications ([place]) 51/70/0174   Dyslipidemia associated with type 2 diabetes mellitus ([place]) 05/06/2014   OSA (obstructive sleep apnea) 05/06/2014   Fatty liver disease,  nonalcoholic 19/37/9024   HTN (hypertension) 02/15/2009   CAD (coronary artery disease), native coronary artery 02/15/2009   Shortness of breath 02/15/2009    Past Surgical History:  Procedure Laterality Date   ABDOMINAL HERNIA REPAIR  2007      [place]   open repair   APPENDECTOMY  age 78   CARDIAC CATHETERIZATION  12-23-2007   [place]   Abnormal myoview w/ ischemia/  40% mRCA with nonobstructive and no sig. plaque in his left system, EF 55%   CARDIAC CATHETERIZATION  Apr 2008    [facility]   Abnormal myoview/  50% RCA,  ef 65%   CARDIAC CATHETERIZATION  1999      [facility]   CATARACT EXTRACTION,  BILATERAL Bilateral 11/2017   CERVICAL FUSION  1992   CHOLECYSTECTOMY OPEN  2006   COLONOSCOPY WITH PROPOFOL N/A 04/17/2016   TAs, high grade dysplasia with margins clear, diverticulosis Lucilla Lame, MD)   COLONOSCOPY WITH PROPOFOL N/A 08/07/2016   TAx1, diverticulosis, rpt 3 yrs ([place])   COLONOSCOPY WITH PROPOFOL N/A 01/05/2020   SSP, rpt Allen Norris, Darren, MD)   DENTAL SURGERY     metal dental implant L mandible   EXCISION OF SKIN TAG Right 09/14/2014   Procedure: EXCISION OF SKIN TAG;  Surgeon: Festus Aloe, MD;  Location: [facility];  Service: Urology;  Laterality: Right;   GROIN MASS OPEN BIOPSY Left 01/17/2019   HYDROCELE EXCISION Left 09/14/2014   Procedure: LEFT HYDROCELECTOMY ADULT;  Surgeon: Festus Aloe, MD;  Location: [facility];  Service: Urology;  Laterality: Left;   MOHS SURGERY  2015   skin cancer   TONSILLECTOMY  age 5    Prior to Admission medications   Medication Sig Start Date End Date Taking? Authorizing Provider  aspirin 81 MG EC tablet Take 1 tablet (81 mg total) by mouth daily. 09/17/14   Festus Aloe, MD  atenolol (TENORMIN) 25 MG tablet Take 1 tablet (25 mg total) by mouth daily. 05/02/20   Minna Merritts, MD  atorvastatin (LIPITOR) 40 MG tablet TAKE 1 TABLET BY MOUTH  DAILY 06/06/20   Ria Bush, MD  Blood Glucose Monitoring Suppl (BLOOD GLUCOSE MONITOR SYSTEM) w/Device KIT 1 each by Does not apply route daily. Use as directed to check blood sugar once daily 07/24/17   Ria Bush, MD  diclofenac sodium (VOLTAREN) 1 % GEL APPLY 2 G TOPICALLY 3 (THREE) TIMES DAILY AS NEEDED (ANTI INFLAMMATORY). 07/24/17   Ria Bush, MD  gabapentin (NEURONTIN) 300 MG capsule TAKE 1 CAPSULE BY MOUTH 3  TIMES DAILY 06/07/20   Ria Bush, MD  Glucose Blood (BLOOD GLUCOSE TEST STRIPS) STRP 1 each by In Vitro route daily. Use as directed to check blood sugar once daily 07/24/17   Ria Bush, MD  ibuprofen  (ADVIL,MOTRIN) 200 MG tablet Take 400-600 mg by mouth every 6 (six) hours as needed for mild pain (DEPENDS ON PAIN IF TAKES 400-600 MG).     [provider]  Lancets MISC 1 each by Does not apply route daily. Use as directed to check blood sugar daily 07/24/17   Ria Bush, MD  losartan (COZAAR) 25 MG tablet Take 1 tablet (25 mg total) by mouth daily. 11/11/20   Ria Bush, MD  metFORMIN (GLUCOPHAGE) 1000 MG tablet TAKE 1 TABLET BY MOUTH  TWICE DAILY WITH A MEAL 06/06/20   Ria Bush, MD  nitroGLYCERIN (NITROSTAT) 0.4 MG SL tablet Place 1 tablet (0.4 mg total) under the tongue every 5 (five) minutes  as needed for chest pain (do not take more than 3 doses.). 06/05/19 09/03/19  Loel Dubonnet, NP    Allergies Elemental sulfur and Sulfa antibiotics  Family History  Problem Relation Age of Onset   CAD Mother        MI   Diabetes Mother    Stroke Mother        mini-stroke   Cancer Father 3       lung (smoker)   Heart disease Paternal Grandmother    Heart disease Paternal Grandfather    Diabetes Paternal Grandfather    Cancer Sister        lung   Coronary artery disease Neg Hx        Premature    Social History Social History   Tobacco Use   Smoking status: Former    Packs/day: 2.00    Years: 35.00    Pack years: 70.00    Types: Cigarettes    Quit date: 01/29/1990    Years since quitting: 30.8   Smokeless tobacco: Never  Vaping Use   Vaping Use: Never used  Substance Use Topics   Alcohol use: No   Drug use: No    Review of Systems  Constitutional: No fever/chills Eyes: No visual changes. ENT: No sore throat. Cardiovascular: Denies chest pain.  Positive for dizziness and lightheadedness. Respiratory: Denies shortness of breath. Gastrointestinal: No abdominal pain.  No nausea, no vomiting.  No diarrhea.  No constipation. Genitourinary: Negative for dysuria. Musculoskeletal: Negative for back pain. Skin: Negative for rash. Neurological:  Negative for headaches, focal weakness or numbness.  ____________________________________________   PHYSICAL EXAM:  VITAL SIGNS: ED Triage Vitals [11/16/20 1408]  Enc Vitals Group     BP 134/66     Pulse Rate (!) 57     Resp 18     Temp 98.2 F (36.8 C)     Temp Source Oral     SpO2 100 %     Weight 240 lb (108.9 kg)     Height _0  (1.778 m)     Head Circumference      Peak Flow      Pain Score 0     Pain Loc      Pain Edu?      Excl. in [place]?     Constitutional: Alert and oriented. Eyes: Conjunctivae are normal. Head: Small abrasion to right frontal scalp, no associated laceration or hematoma. Nose: No congestion/rhinnorhea. Mouth/Throat: Mucous membranes are moist. Neck: Normal ROM, no midline cervical spine tenderness to palpation. Cardiovascular: Normal rate, regular rhythm. Grossly normal heart sounds.  2+ radial pulses bilaterally. Respiratory: Normal respiratory effort.  No retractions. Lungs CTAB. Gastrointestinal: Soft and nontender. No distention. Genitourinary: deferred Musculoskeletal: No lower extremity tenderness nor edema.  No tenderness to palpation at right hip or right knee, range of motion intact to bilateral lower extremities without pain. Neurologic:  Normal speech and language. No gross focal neurologic deficits are appreciated. Skin:  Skin is warm, dry and intact. No rash noted. Psychiatric: Mood and affect are normal. Speech and behavior are normal.  ____________________________________________   LABS (all labs ordered are listed, but only abnormal results are displayed)  Labs Reviewed  CBC WITH DIFFERENTIAL/PLATELET - Abnormal; Notable for the following components:      Result Value   MCH 35.0 (*)    All other components within normal limits  BASIC METABOLIC PANEL - Abnormal; Notable for the following components:   Glucose, Bld 109 (*)  All other components within normal limits    ____________________________________________  EKG  ED ECG REPORT I, Blake Divine, the attending physician, personally viewed and interpreted this ECG.   Date: 11/16/2020  EKG Time: 15:41  Rate: 54  Rhythm: sinus bradycardia  Axis: Normal  Intervals:first-degree A-V block   ST&T Change: None   PROCEDURES  Procedure(s) performed (including Critical Care):  Procedures   ____________________________________________   INITIAL IMPRESSION / ASSESSMENT AND PLAN / ED COURSE      70 year old male with past medical history of hypertension, hyperlipidemia, diabetes, CAD, COPD, and chronic back pain who presents to the ED complaining of dizziness and lightheadedness after falling and hitting his head yesterday.  He complains of some pain to his right hip and knee, but range of motion intact without pain and I doubt fracture or dislocation.  CT head performed from triage and is negative for acute process, CT cervical spine is pending.  Given his dizziness and lightheadedness, we will also screen EKG and labs.  No evidence of traumatic injury to patient's trunk or upper extremities.  EKG shows sinus bradycardia with first-degree AV block, unlikely to be contributing to his dizziness given his normal blood pressure.  CT cervical spine is negative for acute process and labs are unremarkable.  Patient is appropriate for discharge home with PCP follow-up, some of his dizziness and unsteadiness is likely related to concussion.  He was counseled to return to the ED for new worsening symptoms, patient agrees with plan.      ____________________________________________   FINAL CLINICAL IMPRESSION(S) / ED DIAGNOSES  Final diagnoses:  Fall, initial encounter  Dizziness  Concussion without loss of consciousness, initial encounter     ED Discharge Orders     None        Note:  This document was prepared using Dragon voice recognition software and may include unintentional  dictation errors.    Blake Divine, MD 11/16/20 [id number]

## 2020-11-16 NOTE — Telephone Encounter (Signed)
PLEASE NOTE: All timestamps contained within this report are represented as [place] Standard Time. CONFIDENTIALTY NOTICE: This fax transmission is intended only for the addressee. It contains information that is legally privileged, confidential or otherwise protected from use or disclosure. If you are not the intended recipient, you are strictly prohibited from reviewing, disclosing, copying using or disseminating any of this information or taking any action in reliance on or regarding this information. If you have received this fax in error, please notify us immediately by telephone so that we can arrange for its return to [place]. Phone: [phone], Toll-Free: [phone], Fax: [phone] Page: 1 of 2 Call Id: [id number] [facility] - Client TELEPHONE ADVICE RECORD [facility] Patient Name: Casey Golden Gender: Male DOB: 02-Dec-1950 Age: 70 Y 61 M 17 D Return Phone Number: [phone] (Primary) Address: City/ State/ Zip: [place]  [address] Client [facility] - Client Client Site [place] Physician Ria Bush - MD Contact Type Call Who Is Calling Patient / Member / Family / Caregiver Call Type Triage / Clinical Relationship To Patient Self Return Phone Number [phone] (Primary) Chief Complaint Head Injury (non urgent symptom) Reason for Call Symptomatic / Request for [place] states that she is transferring a patient for triage. He has fallen and has a knot on the top of his head from the fall yesterday. No change in behavior. He doesn't believe anything has broken. He also injured his knee and seems to be having pain in his leg when he puts pressure on his foot. Translation No Nurse Assessment Nurse: Lucky Cowboy, RN, Levada Dy Date/Time (Eastern Time): 11/16/2020 12:25:25 PM Confirm and document reason for call. If symptomatic, describe symptoms. ---Caller  stated that he fell yesterday, hit his head, and has a laceration to his head in addition to a lump. He has some issues with putting pressure on his leg. His head feels "different", not painful. He was bending over and doesn't know what happened when he fell, just knows he fell. Does the patient have any new or worsening symptoms? ---Yes Will a triage be completed? ---Yes Related visit to physician within the last 2 weeks? ---No Does the PT have any chronic conditions? (i.e. diabetes, asthma, this includes High risk factors for pregnancy, etc.) ---Yes List chronic conditions. ---changed subject when I asked Is this a behavioral health or substance abuse call? ---No Guidelines Guideline Title Affirmed Question Affirmed Notes Nurse Date/Time (Eastern Time) Head Injury Large swelling or bruise > 2 inches (5 cm) Dew, RN, Levada Dy 11/16/2020 12:30:35 PM PLEASE NOTE: All timestamps contained within this report are represented as [place] Standard Time. CONFIDENTIALTY NOTICE: This fax transmission is intended only for the addressee. It contains information that is legally privileged, confidential or otherwise protected from use or disclosure. If you are not the intended recipient, you are strictly prohibited from reviewing, disclosing, copying using or disseminating any of this information or taking any action in reliance on or regarding this information. If you have received this fax in error, please notify us immediately by telephone so that we can arrange for its return to [place]. Phone: [phone], Toll-Free: [phone], Fax: [phone] Page: 2 of 2 Call Id: [id number] [place]. Time Eilene Ghazi Time) Disposition Final User 11/16/2020 12:36:46 PM Go to ED Now Yes Dew, RN, Marin Shutter Disagree/Comply Comply Caller Understands Yes PreDisposition [place] Advice Given Per Guideline GO TO ED NOW: * You need to be seen in the  Emergency Department. * Go to the ED at ___________ [place] now. Drive carefully. CARE ADVICE given per Head Injury (Adult) guideline. Comments User: Raford Pitcher, RN Date/Time Eilene Ghazi Time): 11/16/2020 12:38:09 PM Ins caller that he should have someone else drive him. He had some pauses that made me wonder if he was struggling for word selection. His bump is over 2" across he described. Referrals [facility] - ED

## 2020-11-16 NOTE — ED Triage Notes (Signed)
Pt comes pov after falling yesterday. Pt was bending over to water pepper plants and stood up and fell. Pt has scrape to forehead. Pt is not on thinners.

## 2020-11-16 NOTE — ED Provider Notes (Signed)
Emergency Medicine Provider Triage Evaluation Note  Jyren Cerasoli , a 70 y.o. male  was evaluated in triage.  Pt complains of mechanical fall. He was working in the garden yesterday, when he stood up and subsequently fell. He is unsure what caused the fall. He presents today with an abrasion to the right forehead. He denies LOC, N/V, or subsequent weakness.   Review of Systems  Positive: Head injury, facial abrasion Negative: N/V  Physical Exam  BP 134/66   Pulse (!) 57   Temp 98.2 F (36.8 C) (Oral)   Resp 18   Ht 5\' 10"  (1.778 m)   Wt 108.9 kg   SpO2 100%   BMI 34.44 kg/m  Gen:   Awake, no distress  NAD Resp:  Normal effort CTA MSK:   Moves extremities without difficulty  Other:  CVS: RRR  Medical Decision Making  Medically screening exam initiated at 2:14 PM.  Appropriate orders placed.  Eula Mazzola was informed that the remainder of the evaluation will be completed by another provider, this initial triage assessment does not replace that evaluation, and the importance of remaining in the ED until their evaluation is complete.  Geriatric patient with ED evaluation of injuries following a mechanical fall yesterday.    [name], PA-C 11/16/20 1418    Blake Divine, MD 11/16/20 1946

## 2020-11-16 NOTE — Telephone Encounter (Signed)
Noted. Verified that he did check into [facility] ED.

## 2020-11-16 NOTE — Telephone Encounter (Signed)
Patient is currently at [facility] ER. 

## 2020-11-16 NOTE — Telephone Encounter (Signed)
Pt fell yesterday and has small knot on head. pt also hurt knee and hip and want to talk to a cma. Pt stated If he needs appt he will make one.

## 2020-11-20 ENCOUNTER — Other Ambulatory Visit: Payer: Self-pay | Admitting: Family Medicine

## 2020-11-22 NOTE — Telephone Encounter (Signed)
Patient notified as instructed by telephone and verbalized understanding.  Patient stated that he has a follow-up appointment scheduled this Friday 11/25/20 with Dr. Danise Mina.

## 2020-11-22 NOTE — Telephone Encounter (Signed)
Plz encourage him to make a good effort to stay well hydrated which is even more important after recently starting BP medication.

## 2020-11-25 ENCOUNTER — Other Ambulatory Visit: Payer: Self-pay

## 2020-11-25 ENCOUNTER — Encounter: Payer: Self-pay | Admitting: Family Medicine

## 2020-11-25 ENCOUNTER — Ambulatory Visit (INDEPENDENT_AMBULATORY_CARE_PROVIDER_SITE_OTHER): Payer: Medicare Other | Admitting: Family Medicine

## 2020-11-25 VITALS — BP 146/82 | HR 60 | Temp 98.0°F | Ht 70.0 in | Wt 243.1 lb

## 2020-11-25 DIAGNOSIS — I440 Atrioventricular block, first degree: Secondary | ICD-10-CM | POA: Diagnosis not present

## 2020-11-25 DIAGNOSIS — I10 Essential (primary) hypertension: Secondary | ICD-10-CM

## 2020-11-25 DIAGNOSIS — R109 Unspecified abdominal pain: Secondary | ICD-10-CM | POA: Diagnosis not present

## 2020-11-25 DIAGNOSIS — W19XXXA Unspecified fall, initial encounter: Secondary | ICD-10-CM | POA: Diagnosis not present

## 2020-11-25 LAB — POC URINALSYSI DIPSTICK (AUTOMATED)
Bilirubin, UA: NEGATIVE
Blood, UA: NEGATIVE
Glucose, UA: NEGATIVE
Ketones, UA: NEGATIVE
Leukocytes, UA: NEGATIVE
Nitrite, UA: NEGATIVE
Protein, UA: NEGATIVE
Spec Grav, UA: 1.015 (ref 1.010–1.025)
Urobilinogen, UA: 0.2 E.U./dL
pH, UA: 6 (ref 5.0–8.0)

## 2020-11-25 MED ORDER — LOSARTAN POTASSIUM 50 MG PO TABS: 50.0000 mg | ORAL_TABLET | Freq: Every day | ORAL | 11 refills | Status: DC

## 2020-11-25 NOTE — Assessment & Plan Note (Signed)
Deteriorated. Has been off atenolol for several months. Will remain off given 1st degree AV block noted on recent EKG. Instead titrate losartan up to 50mg  daily. Update [place] with effect on blood pressures.

## 2020-11-25 NOTE — Progress Notes (Signed)
Patient ID: Casey Golden, male    DOB: [birth year], 70 y.o.   MRN: [id number]  This visit was conducted in person.  BP (!) 146/82   Pulse 60   Temp 98 F (36.7 C) (Temporal)   Ht _0  (1.778 m)   Wt 243 lb 1 oz (110.3 kg)   SpO2 100%   BMI 34.88 kg/m    Orthostatic VS for the past 24 hrs (Last 3 readings):  BP- Lying BP- Standing at 3 minutes  11/25/20 0909 -- 162/70  11/25/20 0906 154/84 --  160/80 sitting on repeat testing.   CC: ER f/u visit  Subjective:   HPI: Casey Golden is a 70 y.o. male presenting on 11/25/2020 for Hospitalization Follow-up (Seen on 11/16/20 at [facility] ED, dx fall; dizziness. ) and Flank Pain (C/o left flank pain.  Started yesterday. )   DOI: 11/15/2020  While working outdoors in his yard lost balance and fell forward, striking his head. No LOC. Was working on his [employer]. Felt "odd". Doesn't think he had dizziness or lightheadedness.  ER records reviewed. Dx possible concussion.  CT head and neck reassuringly normal. Labs reassuring. EKG showed sinus bradycardia with 1st degree AV block (chronically on atenolol).  Since home, overall feeling well.   Losartan 4m started 11/11/2020 due to persistently elevated blood pressures at home up to 166/90s. Tolerating well however home readings remain elevated 150-160/70-80s, HR 60-70s. He's not been taking atenolol for last several months "prescription didn't come through."  Btw - sudden episode yesterday of L lateral lower back pain, that has since resolved today. No urinary symptoms. UA checked today.      Relevant past medical, surgical, family and social history reviewed and updated as indicated. Interim medical history since our last visit reviewed. Allergies and medications reviewed and updated. Outpatient Medications Prior to Visit  Medication Sig Dispense Refill   aspirin 81 MG EC tablet Take 1 tablet (81 mg total) by mouth daily. 30 tablet 12   atorvastatin (LIPITOR) 40 MG tablet  TAKE 1 TABLET BY MOUTH  DAILY 90 tablet 1   Blood Glucose Monitoring Suppl (BLOOD GLUCOSE MONITOR SYSTEM) w/Device KIT 1 each by Does not apply route daily. Use as directed to check blood sugar once daily 1 each 0   diclofenac sodium (VOLTAREN) 1 % GEL APPLY 2 G TOPICALLY 3 (THREE) TIMES DAILY AS NEEDED (ANTI INFLAMMATORY). 100 g 1   gabapentin (NEURONTIN) 300 MG capsule TAKE 1 CAPSULE BY MOUTH 3  TIMES DAILY 270 capsule 3   Glucose Blood (BLOOD GLUCOSE TEST STRIPS) STRP 1 each by In Vitro route daily. Use as directed to check blood sugar once daily 100 each 0   ibuprofen (ADVIL,MOTRIN) 200 MG tablet Take 400-600 mg by mouth every 6 (six) hours as needed for mild pain (DEPENDS ON PAIN IF TAKES 400-600 MG).      Lancets MISC 1 each by Does not apply route daily. Use as directed to check blood sugar daily 100 each 0   metFORMIN (GLUCOPHAGE) 1000 MG tablet TAKE 1 TABLET BY MOUTH  TWICE DAILY WITH A MEAL 180 tablet 1   losartan (COZAAR) 25 MG tablet Take 1 tablet (25 mg total) by mouth daily. 30 tablet 6   nitroGLYCERIN (NITROSTAT) 0.4 MG SL tablet Place 1 tablet (0.4 mg total) under the tongue every 5 (five) minutes as needed for chest pain (do not take more than 3 doses.). 75 tablet 1   atenolol (TENORMIN) 25 MG  tablet Take 1 tablet (25 mg total) by mouth daily. (Patient not taking: Reported on 11/25/2020) 90 tablet 3   No facility-administered medications prior to visit.     Per HPI unless specifically indicated in ROS section below Review of Systems  Objective:  BP (!) 146/82   Pulse 60   Temp 98 F (36.7 C) (Temporal)   Ht _0  (1.778 m)   Wt 243 lb 1 oz (110.3 kg)   SpO2 100%   BMI 34.88 kg/m   Wt Readings from Last 3 Encounters:  11/25/20 243 lb 1 oz (110.3 kg)  11/16/20 240 lb (108.9 kg)  09/13/20 243 lb 1 oz (110.3 kg)      Physical Exam Vitals and nursing note reviewed.  Constitutional:      Appearance: Normal appearance. He is not ill-appearing.  Eyes:     Extraocular  Movements: Extraocular movements intact.     Conjunctiva/sclera: Conjunctivae normal.     Pupils: Pupils are equal, round, and reactive to light.  Cardiovascular:     Rate and Rhythm: Normal rate and regular rhythm.     Pulses: Normal pulses.     Heart sounds: Normal heart sounds. No murmur heard. Pulmonary:     Effort: Pulmonary effort is normal. No respiratory distress.     Breath sounds: Normal breath sounds. No wheezing, rhonchi or rales.  Abdominal:     Tenderness: There is no right CVA tenderness or left CVA tenderness.  Musculoskeletal:     Right lower leg: No edema.     Left lower leg: No edema.     Comments: Ambulates with stooped posture  Skin:    General: Skin is warm and dry.     Findings: No rash.  Neurological:     Mental Status: He is alert.  Psychiatric:        Mood and Affect: Mood normal.        Behavior: Behavior normal.      Results for orders placed or performed in visit on 11/25/20  POCT Urinalysis Dipstick (Automated)  Result Value Ref Range   Color, UA yellow    Clarity, UA clear    Glucose, UA Negative Negative   Bilirubin, UA negative    Ketones, UA negative    Spec Grav, UA 1.015 1.010 - 1.025   Blood, UA negative    pH, UA 6.0 5.0 - 8.0   Protein, UA Negative Negative   Urobilinogen, UA 0.2 0.2 or 1.0 E.U./dL   Nitrite, UA negative    Leukocytes, UA Negative Negative    Assessment & Plan:  This visit occurred during the SARS-CoV-2 public health emergency.  Safety protocols were in place, including screening questions prior to the visit, additional usage of staff PPE, and extensive cleaning of exam room while observing appropriate contact time as indicated for disinfecting solutions.   Problem List Items Addressed This Visit     HTN (hypertension)    Deteriorated. Has been off atenolol for several months. Will remain off given 1st degree AV block noted on recent EKG. Instead titrate losartan up to 65m daily. Update [place] effect on blood  pressures.       Relevant Medications   losartan (COZAAR) 50 MG tablet   First degree AV block    Noted on EKG at ER.  He has been off atenolol for several months - advised stay off this. See above       Relevant Medications   losartan (COZAAR) 50 MG tablet  Fall with injury - Primary    Sounds like he had mechanical fall after losing balance while working in the yard, with head injury. Fortunately head/neck imaging at ER was reassuringly normal.  Orthostatic vital signs normal.  Encouraged good hydration status, avoiding sudden rapid position changes.       Other Visit Diagnoses     Flank pain       Relevant Orders   POCT Urinalysis Dipstick (Automated) (Completed)        Meds ordered this encounter  Medications   losartan (COZAAR) 50 MG tablet    Sig: Take 1 tablet (50 mg total) by mouth daily.    Dispense:  30 tablet    Refill:  11    Note new dose    Orders Placed This Encounter  Procedures   POCT Urinalysis Dipstick (Automated)     Patient Instructions  Urine looking ok.  Stay off atenolol medicine.  Increase losartan to 38m daily - new dose is at pharmacy.  Ensure staying well hydrated, avoid sudden position changes.  Keep [place] with your blood pressure readings - goal is <140/90.  Good to see you today.   Follow up plan: Return if symptoms worsen or fail to improve.  JRia Bush MD

## 2020-11-25 NOTE — Assessment & Plan Note (Signed)
Noted on EKG at ER.  He has been off atenolol for several months - advised stay off this. See above

## 2020-11-25 NOTE — Assessment & Plan Note (Signed)
Sounds like he had mechanical fall after losing balance while working in the yard, with head injury. Fortunately head/neck imaging at ER was reassuringly normal.  Orthostatic vital signs normal.  Encouraged good hydration status, avoiding sudden rapid position changes.

## 2020-11-25 NOTE — Patient Instructions (Addendum)
Urine looking ok.  Stay off atenolol medicine.  Increase losartan to 50mg  daily - new dose is at pharmacy.  Ensure staying well hydrated, avoid sudden position changes.  Keep [place] updated with your blood pressure readings - goal is <140/90.  Good to see you today.

## 2020-12-07 ENCOUNTER — Other Ambulatory Visit: Payer: Self-pay

## 2020-12-07 ENCOUNTER — Emergency Department: Payer: Medicare Other

## 2020-12-07 ENCOUNTER — Emergency Department
Admission: EM | Admit: 2020-12-07 | Discharge: 2020-12-07 | Disposition: A | Discharge status: Home / Self Care | Payer: Medicare Other | Attending: Emergency Medicine | Admitting: Emergency Medicine

## 2020-12-07 ENCOUNTER — Encounter: Payer: Self-pay | Admitting: Emergency Medicine

## 2020-12-07 DIAGNOSIS — Z79899 Other long term (current) drug therapy: Secondary | ICD-10-CM | POA: Diagnosis not present

## 2020-12-07 DIAGNOSIS — Z85828 Personal history of other malignant neoplasm of skin: Secondary | ICD-10-CM | POA: Diagnosis not present

## 2020-12-07 DIAGNOSIS — Z87891 Personal history of nicotine dependence: Secondary | ICD-10-CM | POA: Diagnosis not present

## 2020-12-07 DIAGNOSIS — I10 Essential (primary) hypertension: Secondary | ICD-10-CM | POA: Insufficient documentation

## 2020-12-07 DIAGNOSIS — I2510 Atherosclerotic heart disease of native coronary artery without angina pectoris: Secondary | ICD-10-CM | POA: Insufficient documentation

## 2020-12-07 DIAGNOSIS — R079 Chest pain, unspecified: Secondary | ICD-10-CM | POA: Insufficient documentation

## 2020-12-07 DIAGNOSIS — R4182 Altered mental status, unspecified: Secondary | ICD-10-CM | POA: Diagnosis not present

## 2020-12-07 DIAGNOSIS — E119 Type 2 diabetes mellitus without complications: Secondary | ICD-10-CM | POA: Diagnosis not present

## 2020-12-07 DIAGNOSIS — Z7984 Long term (current) use of oral hypoglycemic drugs: Secondary | ICD-10-CM | POA: Diagnosis not present

## 2020-12-07 DIAGNOSIS — R5383 Other fatigue: Secondary | ICD-10-CM | POA: Insufficient documentation

## 2020-12-07 DIAGNOSIS — Z7982 Long term (current) use of aspirin: Secondary | ICD-10-CM | POA: Insufficient documentation

## 2020-12-07 DIAGNOSIS — R918 Other nonspecific abnormal finding of lung field: Secondary | ICD-10-CM | POA: Diagnosis not present

## 2020-12-07 DIAGNOSIS — R55 Syncope and collapse: Secondary | ICD-10-CM

## 2020-12-07 LAB — COMPREHENSIVE METABOLIC PANEL
ALT: 44 U/L (ref 0–44)
AST: 47 U/L — ABNORMAL HIGH (ref 15–41)
Albumin: 4.3 g/dL (ref 3.5–5.0)
Alkaline Phosphatase: 60 U/L (ref 38–126)
Anion gap: 10 (ref 5–15)
BUN: 20 mg/dL (ref 8–23)
CO2: 24 mmol/L (ref 22–32)
Calcium: 9 mg/dL (ref 8.9–10.3)
Chloride: 99 mmol/L (ref 98–111)
Creatinine, Ser: 0.77 mg/dL (ref 0.61–1.24)
GFR, Estimated: >60 mL/min (ref >60)
Glucose, Bld: 145 mg/dL — ABNORMAL HIGH (ref 70–99)
Potassium: 4 mmol/L (ref 3.5–5.1)
Sodium: 133 mmol/L — ABNORMAL LOW (ref 135–145)
Total Bilirubin: 1.2 mg/dL (ref 0.3–1.2)
Total Protein: 7.9 g/dL (ref 6.5–8.1)

## 2020-12-07 LAB — CBC
HCT: 48.5 % (ref 39.0–52.0)
Hemoglobin: 17.3 g/dL — ABNORMAL HIGH (ref 13.0–17.0)
MCH: 34.8 pg — ABNORMAL HIGH (ref 26.0–34.0)
MCHC: 35.7 g/dL (ref 30.0–36.0)
MCV: 97.6 fL (ref 80.0–100.0)
Platelets: 220 10*3/uL (ref 150–400)
RBC: 4.97 MIL/uL (ref 4.22–5.81)
RDW: 11.9 % (ref 11.5–15.5)
WBC: 12.2 10*3/uL — ABNORMAL HIGH (ref 4.0–10.5)
nRBC: 0 % (ref 0.0–0.2)

## 2020-12-07 LAB — URINALYSIS, ROUTINE W REFLEX MICROSCOPIC
Bilirubin Urine: NEGATIVE
Glucose, UA: NEGATIVE mg/dL
Hgb urine dipstick: NEGATIVE
Ketones, ur: NEGATIVE mg/dL
Leukocytes,Ua: NEGATIVE
Nitrite: NEGATIVE
Protein, ur: NEGATIVE mg/dL
Specific Gravity, Urine: 1.014 (ref 1.005–1.030)
pH: 5 (ref 5.0–8.0)

## 2020-12-07 LAB — TROPONIN I (HIGH SENSITIVITY)
Troponin I (High Sensitivity): 5 ng/L (ref <18)
Troponin I (High Sensitivity): 6 ng/L (ref <18)

## 2020-12-07 NOTE — Discharge Instructions (Signed)
Your EKG and labs today are all okay.  Please drink plenty of fluids to stay hydrated and follow up with your doctor.

## 2020-12-07 NOTE — ED Provider Notes (Signed)
[facility] EMERGENCY DEPARTMENT Provider Note   CSN: [id number] Arrival date & time: 12/07/20  1619     History Chief Complaint  Patient presents with   Fatigue   Altered Mental Status   HPI Casey Golden is a 70 y.o. male, history of hypertension, diabetes, and CAD, presents to the emergency department for increased lethargy and altered mental status.  Patient's wife states that she initially found him staring blankly at the TV around 1 PM today.  Later, he was supposed to pick up his grandkids today and when he was supposed to pick them up, he was on the commode and not getting up for his wife.  The wife subsequently left to get the grandkids and when she came back the patient was still sitting on the commode.  When asked, the patient states that he vaguely remembers the events and remembers feeling confused and dizzy, with some brief central chest pain.  With assistance of family members the patient was brought to the emergency department.  Since arrival to the ED the wife states that patient has substantially improved.  Patient states that he no longer has chest pain at this time, but does feel slightly sluggish.  Denies recent illness, injury, fever/chills, abdominal pain, back pain, SOB, visual changes, or numbness/tingliness in upper or lower extremities     Past Medical History:  Diagnosis Date   Arthritis    CAD (coronary artery disease)    cathx3 with nonobstructive disease. Last cath with RCA 40% stenosis in 2009   Cancer [facility])    Cataract 2019   bilateral; resolved with surgery   Colonic polyp    Fatty liver disease, nonalcoholic 8502   by [place]   GERD (gastroesophageal reflux disease)    History of chicken pox    Hyperlipidemia    Hypertension    Nocturia    OSA (obstructive sleep apnea)    CPAP, compliant   Paget's disease of bony pelvis    Past use of tobacco    Quit 1990, 70 pack year history   Rash of genital area    09-08-2014  per pt  Dr Junious Silk aware   Seasonal and perennial allergic rhinitis    Type 2 diabetes mellitus ([place])    Wears dentures    full upper/  partial lower   Wears glasses     Patient Active Problem List   Diagnosis Date Noted   First degree AV block 11/25/2020   Fall with injury 11/25/2020   Adverse reaction to pneumococcal vaccine 09/13/2020   Cerebrovascular small vessel disease 09/13/2020   Porokeratosis 05/16/2020   Plantar flexed metatarsal, left 05/16/2020   Plantar flexed metatarsal, right 05/16/2020   Polyp of transverse colon    Medicare annual wellness visit, subsequent 05/27/2019   Centrilobular emphysema ([place]) 02/05/2019   Pulmonary nodule less than 6 cm determined by computed tomography of lung 11/06/2018   Atherosclerosis of aorta ([place]) 11/05/2018   Extramammary Paget's disease 10/24/2018   Pain due to onychomycosis of toenails of both feet 07/31/2018   Osteoarthritis of knee 05/23/2017   Cerumen impaction 05/20/2017   Ex-smoker 03/27/2017   Elevated PSA 03/27/2017   Stress due to family tension 05/15/2016   Skin rash 05/15/2016   Hx of colonic polyps    Benign neoplasm of transverse colon    Polyp of sigmoid colon    Benign neoplasm of descending colon    Chronic midline low back pain 02/27/2016   Bilateral hip pain  11/02/2015   Right knee pain 07/12/2015   Health maintenance examination 03/07/2015   Severe obesity (BMI 35.0-39.9) with comorbidity ([place]) 03/07/2015   Advanced care planning/counseling discussion 03/07/2015   Controlled diabetes mellitus type 2 with complications ([place]) 75/64/3329   Dyslipidemia associated with type 2 diabetes mellitus ([place]) 05/06/2014   OSA (obstructive sleep apnea) 05/06/2014   Fatty liver disease, nonalcoholic 51/88/4166   HTN (hypertension) 02/15/2009   CAD (coronary artery disease), native coronary artery 02/15/2009   Shortness of breath 02/15/2009    Past Surgical History:  Procedure Laterality Date   ABDOMINAL HERNIA REPAIR   2007      [place]   open repair   APPENDECTOMY  age 48   CARDIAC CATHETERIZATION  12-23-2007   [place]   Abnormal myoview w/ ischemia/  40% mRCA with nonobstructive and no sig. plaque in his left system, EF 55%   CARDIAC CATHETERIZATION  Apr 2008    [facility]   Abnormal myoview/  50% RCA,  ef 65%   CARDIAC CATHETERIZATION  1999      [facility]   CATARACT EXTRACTION, BILATERAL Bilateral 11/2017   CERVICAL FUSION  1992   CHOLECYSTECTOMY OPEN  2006   COLONOSCOPY WITH PROPOFOL N/A 04/17/2016   TAs, high grade dysplasia with margins clear, diverticulosis Lucilla Lame, MD)   COLONOSCOPY WITH PROPOFOL N/A 08/07/2016   TAx1, diverticulosis, rpt 3 yrs ([place])   COLONOSCOPY WITH PROPOFOL N/A 01/05/2020   SSP, rpt Allen Norris, Darren, MD)   DENTAL SURGERY     metal dental implant L mandible   EXCISION OF SKIN TAG Right 09/14/2014   Procedure: EXCISION OF SKIN TAG;  Surgeon: Festus Aloe, MD;  Location[facility];  Service: Urology;  Laterality: Right;   GROIN MASS OPEN BIOPSY Left 01/17/2019   HYDROCELE EXCISION Left 09/14/2014   Procedure: LEFT HYDROCELECTOMY ADULT;  Surgeon: Festus Aloe, MD;  Location: [facility];  Service: Urology;  Laterality: Left;   MOHS SURGERY  2015   skin cancer   TONSILLECTOMY  age 58       Family History  Problem Relation Age of Onset   CAD Mother        MI   Diabetes Mother    Stroke Mother        mini-stroke   Cancer Father 9       lung (smoker)   Heart disease Paternal Grandmother    Heart disease Paternal Grandfather    Diabetes Paternal Grandfather    Cancer Sister        lung   Coronary artery disease Neg Hx        Premature    Social History   Tobacco Use   Smoking status: Former    Packs/day: 2.00    Years: 35.00    Pack years: 70.00    Types: Cigarettes    Quit date: 01/29/1990    Years since quitting: 30.8   Smokeless tobacco: Never  Vaping Use   Vaping Use: Never used  Substance Use Topics   Alcohol use: No    Drug use: No    Home Medications Prior to Admission medications   Medication Sig Start Date End Date Taking? Authorizing Provider  aspirin 81 MG EC tablet Take 1 tablet (81 mg total) by mouth daily. 09/17/14   Festus Aloe, MD  atorvastatin (LIPITOR) 40 MG tablet TAKE 1 TABLET BY MOUTH  DAILY 11/23/20   Ria Bush, MD  Blood Glucose Monitoring Suppl (BLOOD GLUCOSE MONITOR SYSTEM) w/Device  KIT 1 each by Does not apply route daily. Use as directed to check blood sugar once daily 07/24/17   Ria Bush, MD  diclofenac sodium (VOLTAREN) 1 % GEL APPLY 2 G TOPICALLY 3 (THREE) TIMES DAILY AS NEEDED (ANTI INFLAMMATORY). 07/24/17   Ria Bush, MD  gabapentin (NEURONTIN) 300 MG capsule TAKE 1 CAPSULE BY MOUTH 3  TIMES DAILY 06/07/20   Ria Bush, MD  Glucose Blood (BLOOD GLUCOSE TEST STRIPS) STRP 1 each by In Vitro route daily. Use as directed to check blood sugar once daily 07/24/17   Ria Bush, MD  ibuprofen (ADVIL,MOTRIN) 200 MG tablet Take 400-600 mg by mouth every 6 (six) hours as needed for mild pain (DEPENDS ON PAIN IF TAKES 400-600 MG).     [provider]  Lancets MISC 1 each by Does not apply route daily. Use as directed to check blood sugar daily 07/24/17   Ria Bush, MD  losartan (COZAAR) 50 MG tablet Take 1 tablet (50 mg total) by mouth daily. 11/25/20   Ria Bush, MD  metFORMIN (GLUCOPHAGE) 1000 MG tablet TAKE 1 TABLET BY MOUTH  TWICE DAILY WITH A MEAL 11/23/20   Ria Bush, MD  nitroGLYCERIN (NITROSTAT) 0.4 MG SL tablet Place 1 tablet (0.4 mg total) under the tongue every 5 (five) minutes as needed for chest pain (do not take more than 3 doses.). 06/05/19 09/03/19  Loel Dubonnet, NP    Allergies    Elemental sulfur and Sulfa antibiotics  Review of Systems   Review of Systems  Constitutional:  Positive for activity change and fatigue. Negative for fever.  HENT: Negative.    Eyes: Negative.  Negative for photophobia and  visual disturbance.  Respiratory:  Negative for shortness of breath.   Cardiovascular:  Positive for chest pain. Negative for palpitations and leg swelling.       Dull, central chest pain  Gastrointestinal: Negative.   Endocrine: Negative.   Genitourinary: Negative.   Musculoskeletal: Negative.   Skin: Negative.   Neurological:  Positive for dizziness, weakness and light-headedness. Negative for seizures, syncope, facial asymmetry, speech difficulty, numbness and headaches.  Hematological: Negative.   Psychiatric/Behavioral: Negative.     Physical Exam Updated Vital Signs BP (!) 158/81 (BP Location: Right Arm)   Pulse 92   Temp 98 F (36.7 C) (Oral)   Resp 20   Ht 5' 10"  (1.778 m)   Wt 110.3 kg   SpO2 94%   BMI 34.88 kg/m   Physical Exam Constitutional:      General: He is not in acute distress.    Appearance: Normal appearance. He is not ill-appearing or toxic-appearing.  HENT:     Head: Normocephalic.     Nose: Nose normal.     Mouth/Throat:     Mouth: Mucous membranes are moist.     Pharynx: Oropharynx is clear.  Eyes:     Extraocular Movements: Extraocular movements intact.     Conjunctiva/sclera: Conjunctivae normal.     Pupils: Pupils are equal, round, and reactive to light.  Cardiovascular:     Rate and Rhythm: Normal rate and regular rhythm.  Pulmonary:     Effort: Pulmonary effort is normal.     Breath sounds: Normal breath sounds.  Abdominal:     General: Abdomen is flat.     Palpations: Abdomen is soft.  Musculoskeletal:     Cervical back: Normal range of motion and neck supple. No rigidity.  Lymphadenopathy:     Cervical: No cervical adenopathy.  Neurological:     General: No focal deficit present.     Mental Status: He is alert and oriented to person, place, and time.     Motor: No weakness.     Coordination: Coordination normal.     Comments: Cranial Nerves II-XII intact. Normal sensation in all extremities. Strength 4/5 bilaterally in upper and  lower extremities. Finger-to-nose intact. No pronator drift. Pt is AAOX4, though is unable to remember the names of presidents.   Psychiatric:        Mood and Affect: Mood normal.        Behavior: Behavior normal.        Thought Content: Thought content normal.        Judgment: Judgment normal.    ED Results / Procedures / Treatments   Labs (all labs ordered are listed, but only abnormal results are displayed) Labs Reviewed  CBC - Abnormal; Notable for the following components:      Result Value   WBC 12.2 (*)    Hemoglobin 17.3 (*)    MCH 34.8 (*)    All other components within normal limits  URINALYSIS, ROUTINE W REFLEX MICROSCOPIC - Abnormal; Notable for the following components:   Color, Urine YELLOW (*)    APPearance CLEAR (*)    All other components within normal limits  COMPREHENSIVE METABOLIC PANEL - Abnormal; Notable for the following components:   Sodium 133 (*)    Glucose, Bld 145 (*)    AST 47 (*)    All other components within normal limits  CBG MONITORING, ED  TROPONIN I (HIGH SENSITIVITY)  TROPONIN I (HIGH SENSITIVITY)    EKG None  Radiology DG Chest 2 View  Result Date: 12/07/2020 CLINICAL DATA:  Chest x-ray EXAM: CHEST - 2 VIEW.  Patient is rotated on frontal view. COMPARISON:  Chest x-ray 09/12/2020, CT chest 11/04/2018 FINDINGS: The heart and mediastinal contours are unchanged. No focal consolidation. No pulmonary edema. No pleural effusion. No pneumothorax. No acute osseous abnormality. IMPRESSION: No active cardiopulmonary disease. Electronically Signed   By: Iven Finn M.D.   On: 12/07/2020 20:03   CT HEAD WO CONTRAST (5MM)  Result Date: 12/07/2020 CLINICAL DATA:  Mental status changes of unknown cause.  Lethargy. EXAM: CT HEAD WITHOUT CONTRAST TECHNIQUE: Contiguous axial images were obtained from the base of the skull through the vertex without intravenous contrast. COMPARISON:  09/10/2020 FINDINGS: Brain: No acute finding. Mild chronic  small-vessel ischemic change of the white matter. Mild chronic ventriculomegaly felt most consistent with central atrophy. No sign of mass, hemorrhage, hydrocephalus or extra-axial collection. Vascular: There is atherosclerotic calcification of the major vessels at the base of the brain. Skull: Normal Sinuses/Orbits: Clear/normal Other: None low IMPRESSION: Cerebral Atrophy (ICD10-G31.9). Mild chronic small-vessel ischemic change of the white matter. Stable ventriculomegaly, felt most likely secondary to central atrophy. However, normal pressure hydrocephalus is not dogmatically excluded by the imaging in this case. Electronically Signed   By: Nelson Chimes M.D.   On: 12/07/2020 17:14    Procedures Procedures   Medications Ordered in ED Medications - No data to display  ED Course  I have reviewed the triage vital signs and the nursing notes.  Pertinent labs & imaging results that were available during my care of the patient were reviewed by me and considered in my medical decision making (see chart for details).    MDM Rules/Calculators/A&P  70 year old male presents to the emergency department sudden onset of altered mental status, chest pain, and fatigue.  Patient reports concerning history, as verified by his wife.  However, both patient and his wife agree that symptoms have improved significantly over the past few hours. Differentials considered at this time include TIA, CVA, normal pressure hydrocephalus, dementia, occult urinary infection, and ACS.   Given history of chest pain and cardiac risk factors, will order EKG, CXR, and troponins for evaluation of ACS. Pending results.   Patient is currently alert and neurologically  intact at this time. Noncontrast CT ordered upon arrival to the emergency department, which showed mild cerebral atrophy, consistent with previous imaging.  No acute findings. Will likely discharge with outpatient neurology follow-up.   Care  transferred over to Dr. Joni Fears at 2010.     Final Clinical Impression(s) / ED Diagnoses Final diagnoses:  None    Rx / DC Orders ED Discharge Orders     None        Curlie, Macken, PA 12/07/20 2009    Carrie Mew, MD 12/07/20 2235

## 2020-12-07 NOTE — ED Triage Notes (Signed)
Pt comes into the ED via POV with his wife c/o increased lethargy and AMS.  Pt was supposed to pick up his grand kids today and when he was supposed to pick them up, he was on the commode and not getting up for his wife.  Wife left to get the grand kids and when she came back the patient was still sitting on the commode.  Pt is slow to responses compared to baseline.  Pt does admit to some dizziness and chest pain.

## 2020-12-13 ENCOUNTER — Telehealth: Payer: Self-pay | Admitting: Family Medicine

## 2020-12-13 NOTE — Telephone Encounter (Signed)
Patient has called the office stating he understands he does need to follow up with a neurologist, but he has requested for Dr.G to put in a REFERRAL for the best one he suggests.  Please call patient with any questions/concerns- [phone]

## 2020-12-13 NOTE — Telephone Encounter (Signed)
Looks like he has appt scheduled with [facility] neurology 11/29 - recommend he keep this appointment with [facility]. I'm happy with who he's scheduled tos ee.

## 2020-12-13 NOTE — Telephone Encounter (Signed)
Spoke with pt relaying Dr. G's message.  Pt verbalizes understanding and expresses his thanks.  

## 2020-12-27 ENCOUNTER — Encounter: Payer: Self-pay | Admitting: Adult Health

## 2020-12-27 ENCOUNTER — Ambulatory Visit: Payer: Medicare Other | Admitting: Adult Health

## 2020-12-27 VITALS — BP 157/85 | HR 75 | Ht 69.0 in | Wt 245.0 lb

## 2020-12-27 DIAGNOSIS — Z9989 Dependence on other enabling machines and devices: Secondary | ICD-10-CM

## 2020-12-27 DIAGNOSIS — G4733 Obstructive sleep apnea (adult) (pediatric): Secondary | ICD-10-CM | POA: Diagnosis not present

## 2020-12-27 NOTE — Progress Notes (Signed)
PATIENT: Casey Golden DOB: 1950-06-06  REASON FOR VISIT: follow up HISTORY FROM: patient  HISTORY OF PRESENT ILLNESS: Today 12/27/20:  Casey Golden is a 70 year old male with a history of obstructive sleep apnea on CPAP.  He returns today for follow-up.  Download indicates that he use his machine 27 out of 30 days for compliance of 90%.  He uses machine greater than 4 hours 16 days for compliance of 53%.  On average he uses his machine 5 hours and 7 minutes.  His residual AHI is 4.5 on 7 cm of water with EPR of 3.  Reports that the CPAP works well for him.  He denies any new issues.  Returns today for follow-up.  HISTORY 12/22/2018: I reviewed his CPAP compliance data From 11/18/2018 through 12/17/2018 which is a total of 30 days, during which time he used his machine 28 days with percent use days greater than 4 hours at 80%, indicating very good compliance with an average usage of 5 hours and 24 minutes, residual AHI borderline at 5.2/h, leak on the high side with a 95th percentile at 21.1 L/min on a pressure of 7 cm with EPR of 3. He reports generally doing okay with his CPAP but sometimes the mask is uncomfortable as he had a skin cancer removed from the upper left lip area.  He has been seeing other specialists, he has a lesion in his groin that needs removing.  In the process of working this up he had a CT of his chest, abdomen and pelvis, he was told he had a spot on the lung which needs monitoring.  He is up-to-date with his CPAP related supplies and motivated to continue with treatment, he continues to benefit from it.  REVIEW OF SYSTEMS: Out of a complete 14 system review of symptoms, the patient complains only of the following symptoms, and all other reviewed systems are negative.  FSS ESS  ALLERGIES: Allergies  Allergen Reactions   Elemental Sulfur    Sulfa Antibiotics Hives    HOME MEDICATIONS: Outpatient Medications Prior to Visit  Medication Sig Dispense Refill   aspirin  81 MG EC tablet Take 1 tablet (81 mg total) by mouth daily. 30 tablet 12   atorvastatin (LIPITOR) 40 MG tablet TAKE 1 TABLET BY MOUTH  DAILY (Patient taking differently: 20 mg.) 90 tablet 1   Blood Glucose Monitoring Suppl (BLOOD GLUCOSE MONITOR SYSTEM) w/Device KIT 1 each by Does not apply route daily. Use as directed to check blood sugar once daily 1 each 0   diclofenac sodium (VOLTAREN) 1 % GEL APPLY 2 G TOPICALLY 3 (THREE) TIMES DAILY AS NEEDED (ANTI INFLAMMATORY). 100 g 1   gabapentin (NEURONTIN) 300 MG capsule TAKE 1 CAPSULE BY MOUTH 3  TIMES DAILY 270 capsule 3   Glucose Blood (BLOOD GLUCOSE TEST STRIPS) STRP 1 each by In Vitro route daily. Use as directed to check blood sugar once daily 100 each 0   ibuprofen (ADVIL,MOTRIN) 200 MG tablet Take 200 mg by mouth as needed for mild pain (DEPENDS ON PAIN IF TAKES 400-600 MG).     Lancets MISC 1 each by Does not apply route daily. Use as directed to check blood sugar daily 100 each 0   losartan (COZAAR) 50 MG tablet Take 1 tablet (50 mg total) by mouth daily. 30 tablet 11   metFORMIN (GLUCOPHAGE) 1000 MG tablet TAKE 1 TABLET BY MOUTH  TWICE DAILY WITH A MEAL 180 tablet 1   nitroGLYCERIN (NITROSTAT) 0.4  MG SL tablet Place 1 tablet (0.4 mg total) under the tongue every 5 (five) minutes as needed for chest pain (do not take more than 3 doses.). 75 tablet 1   No facility-administered medications prior to visit.    PAST MEDICAL HISTORY: Past Medical History:  Diagnosis Date   Arthritis    CAD (coronary artery disease)    cathx3 with nonobstructive disease. Last cath with RCA 40% stenosis in 2009   Cancer [facility])    Cataract 2019   bilateral; resolved with surgery   Colonic polyp    Fatty liver disease, nonalcoholic 3664   by [place]   GERD (gastroesophageal reflux disease)    History of chicken pox    Hyperlipidemia    Hypertension    Nocturia    OSA (obstructive sleep apnea)    CPAP, compliant   Paget's disease of bony pelvis    Past use of  tobacco    Quit 1990, 70 pack year history   Rash of genital area    09-08-2014  per pt Dr Junious Silk aware   Seasonal and perennial allergic rhinitis    Type 2 diabetes mellitus ([place])    Wears dentures    full upper/  partial lower   Wears glasses     PAST SURGICAL HISTORY: Past Surgical History:  Procedure Laterality Date   ABDOMINAL HERNIA REPAIR  2007      [place]   open repair   APPENDECTOMY  age 70   CARDIAC CATHETERIZATION  12-23-2007   [place]   Abnormal myoview w/ ischemia/  40% mRCA with nonobstructive and no sig. plaque in his left system, EF 55%   CARDIAC CATHETERIZATION  Apr 2008    [facility]   Abnormal myoview/  50% RCA,  ef 65%   CARDIAC CATHETERIZATION  1999      [facility]   CATARACT EXTRACTION, BILATERAL Bilateral 11/2017   CERVICAL FUSION  1992   CHOLECYSTECTOMY OPEN  2006   COLONOSCOPY WITH PROPOFOL N/A 04/17/2016   TAs, high grade dysplasia with margins clear, diverticulosis Lucilla Lame, MD)   COLONOSCOPY WITH PROPOFOL N/A 08/07/2016   TAx1, diverticulosis, rpt 3 yrs ([facility])   COLONOSCOPY WITH PROPOFOL N/A 01/05/2020   SSP, rpt Allen Norris, Darren, MD)   DENTAL SURGERY     metal dental implant L mandible   EXCISION OF SKIN TAG Right 09/14/2014   Procedure: EXCISION OF SKIN TAG;  Surgeon: Festus Aloe, MD;  [facility];  Service: Urology;  Laterality: Right;   GROIN MASS OPEN BIOPSY Left 01/17/2019   HYDROCELE EXCISION Left 09/14/2014   Procedure: LEFT HYDROCELECTOMY ADULT;  Surgeon: Festus Aloe, MD;  Location: [facility];  Service: Urology;  Laterality: Left;   MOHS SURGERY  2015   skin cancer   TONSILLECTOMY  age 34    FAMILY HISTORY: Family History  Problem Relation Age of Onset   CAD Mother        MI   Diabetes Mother    Stroke Mother        mini-stroke   Cancer Father 8       lung (smoker)   Cancer Sister        lung   Heart disease Paternal Grandmother    Heart disease Paternal Grandfather    Diabetes Paternal  Grandfather    Coronary artery disease Neg Hx        Premature   Sleep apnea Neg Hx     SOCIAL HISTORY: Social History  Socioeconomic History   Marital status: Married    Spouse name: Not on file   Number of children: Not on file   Years of education: Not on file   Highest education level: Not on file  Occupational History   Occupation: Full time    Employer: [employer]  Tobacco Use   Smoking status: Former    Packs/day: 2.00    Years: 35.00    Pack years: 70.00    Types: Cigarettes    Quit date: 01/29/1990    Years since quitting: 30.9   Smokeless tobacco: Never  Vaping Use   Vaping Use: Never used  Substance and Sexual Activity   Alcohol use: No   Drug use: No   Sexual activity: Yes  Other Topics Concern   Not on file  Social History Narrative   Lives with wife, dog and cats    Occupation: retired, was self employed, now works at [employer]    Edu: HS   Activity: walks 1.5 mi daily   Diet: some water, fruits/vegetables daily   Social Determinants of Radio broadcast assistant Strain: Low Risk    Difficulty of Paying Living Expenses: Not hard at all  Food Insecurity: No Food Insecurity   Worried About Charity fundraiser in the Last Year: Never true   Arboriculturist in the Last Year: Never true  Transportation Needs: No Transportation Needs   Lack of Transportation (Medical): No   Lack of Transportation (Non-Medical): No  Physical Activity: Sufficiently Active   Days of Exercise per Week: 7 days   Minutes of Exercise per Session: 30 min  Stress: No Stress Concern Present   Feeling of Stress : Not at all  Social Connections: Not on file  Intimate Partner Violence: Not At Risk   Fear of Current or Ex-Partner: No   Emotionally Abused: No   Physically Abused: No   Sexually Abused: No      PHYSICAL EXAM  Vitals:   12/27/20 1116  BP: (!) 157/85  Pulse: 75  Weight: 245 lb (111.1 kg)  Height: 5' 9"  (1.753 m)   Body mass index is 36.18  kg/m.  Generalized: Well developed, in no acute distress  Chest: Lungs clear to auscultation bilaterally  Neurological examination  Mentation: Alert oriented to time, place, history taking. Follows all commands speech and language fluent Cranial nerve II-XII: Extraocular movements were full, visual field were full on confrontational test Head turning and shoulder shrug  were normal and symmetric. Motor: The motor testing reveals 5 over 5 strength of all 4 extremities. Good symmetric motor tone is noted throughout.  Sensory: Sensory testing is intact to soft touch on all 4 extremities. No evidence of extinction is noted.  Gait and station: Gait is normal.    DIAGNOSTIC DATA (LABS, IMAGING, TESTING) - I reviewed patient records, labs, notes, testing and imaging myself where available.  Lab Results  Component Value Date   WBC 12.2 (H) 12/07/2020   HGB 17.3 (H) 12/07/2020   HCT 48.5 12/07/2020   MCV 97.6 12/07/2020   PLT 220 12/07/2020      Component Value Date/Time   NA 133 (L) 12/07/2020 1629   NA 139 07/21/2014 0935   NA 140 12/05/2013 0904   K 4.0 12/07/2020 1629   K 4.3 12/05/2013 0904   CL 99 12/07/2020 1629   CL 101 12/05/2013 0904   CO2 24 12/07/2020 1629   CO2 32 12/05/2013 0904   GLUCOSE 145 (  H) 12/07/2020 1629   GLUCOSE 157 (H) 12/05/2013 0904   BUN 20 12/07/2020 1629   BUN 13 07/21/2014 0935   BUN 19 (H) 12/05/2013 0904   CREATININE 0.77 12/07/2020 1629   CREATININE 1.29 12/05/2013 0904   CALCIUM 9.0 12/07/2020 1629   CALCIUM 9.2 12/05/2013 0904   PROT 7.9 12/07/2020 1629   PROT 7.2 07/21/2014 0935   PROT 7.3 12/05/2013 0904   ALBUMIN 4.3 12/07/2020 1629   ALBUMIN 4.4 07/21/2014 0935   ALBUMIN 3.7 12/05/2013 0904   AST 47 (H) 12/07/2020 1629   AST 35 12/05/2013 0904   ALT 44 12/07/2020 1629   ALT 55 12/05/2013 0904   ALKPHOS 60 12/07/2020 1629   ALKPHOS 60 12/05/2013 0904   BILITOT 1.2 12/07/2020 1629   BILITOT 0.8 07/21/2014 0935   BILITOT 0.6  12/05/2013 0904   GFRNONAA >60 12/07/2020 1629   GFRNONAA 59 (L) 12/05/2013 0904   GFRAA >60 01/17/2019 0224   GFRAA >60 12/05/2013 0904   Lab Results  Component Value Date   CHOL 119 07/05/2020   HDL 30.90 (L) 07/05/2020   LDLCALC 43 03/21/2017   LDLDIRECT 45.0 07/05/2020   TRIG 283.0 (H) 07/05/2020   CHOLHDL 4 07/05/2020   Lab Results  Component Value Date   HGBA1C 6.5 07/05/2020   Lab Results  Component Value Date   VITAMINB12 378 09/05/2015   No results found for: TSH    ASSESSMENT AND PLAN 70 y.o. year old male  has a past medical history of Arthritis, CAD (coronary artery disease), Cancer ([place]), Cataract (2019), Colonic polyp, Fatty liver disease, nonalcoholic (4451), GERD (gastroesophageal reflux disease), History of chicken pox, Hyperlipidemia, Hypertension, Nocturia, OSA (obstructive sleep apnea), Paget's disease of bony pelvis, Past use of tobacco, Rash of genital area, Seasonal and perennial allergic rhinitis, Type 2 diabetes mellitus ([place]), Wears dentures, and Wears glasses. here with:  OSA on CPAP  - CPAP compliance excellent - Good treatment of AHI  - Encourage patient to use CPAP nightly and > 4 hours each night - F/U in 1 year or sooner if needed   I spent 25 minutes of face-to-face and non-face-to-face time with patient.  This included previsit chart review, lab review, study review, order entry, electronic health record documentation, patient education.  Ward Givens, MSN, NP-C 12/27/2020, 11:31 AM [facility] [address], [place], [place] [address] [phone]

## 2020-12-27 NOTE — Progress Notes (Signed)
PATIENT: Casey Golden DOB: Mar 09, 1950  REASON FOR VISIT: follow up HISTORY FROM: patient  HISTORY OF PRESENT ILLNESS: Today 12/27/20:  Casey Golden is a 70 year old male with a history of obstructive sleep apnea on CPAP.  He returns today for follow-up.  The patient reports that he is not using CPAP machine in 6 months.  States that there is some issue with the billing department and he refuses to use his machine until the DME company fixes this.  Patient states that he had 2 hospitalizations and was advised by his PCP  and ED to discuss this with our office.  I was unaware of these hospitalizations and have not reviewed the notes nor did we receive a new referral.  I advised the patient that we would read over the notes and get him scheduled with our office appropriately.  12/28/19: Casey Golden is a 70 year old male with a history of obstructive sleep apnea on CPAP.  He returns today for follow-up.  Download indicates that he use his machine 27 out of 30 days for compliance of 90%.  He uses machine greater than 4 hours 16 days for compliance of 53%.  On average he uses his machine 5 hours and 7 minutes.  His residual AHI is 4.5 on 7 cm of water with EPR of 3.  Reports that the CPAP works well for him.  He denies any new issues.  Returns today for follow-up.  HISTORY 12/22/2018: I reviewed his CPAP compliance data From 11/18/2018 through 12/17/2018 which is a total of 30 days, during which time he used his machine 28 days with percent use days greater than 4 hours at 80%, indicating very good compliance with an average usage of 5 hours and 24 minutes, residual AHI borderline at 5.2/h, leak on the high side with a 95th percentile at 21.1 L/min on a pressure of 7 cm with EPR of 3. He reports generally doing okay with his CPAP but sometimes the mask is uncomfortable as he had a skin cancer removed from the upper left lip area.  He has been seeing other specialists, he has a lesion in his groin that needs  removing.  In the process of working this up he had a CT of his chest, abdomen and pelvis, he was told he had a spot on the lung which needs monitoring.  He is up-to-date with his CPAP related supplies and motivated to continue with treatment, he continues to benefit from it.  REVIEW OF SYSTEMS: Out of a complete 14 system review of symptoms, the patient complains only of the following symptoms, and all other reviewed systems are negative.  ALLERGIES: Allergies  Allergen Reactions   Elemental Sulfur    Sulfa Antibiotics Hives    HOME MEDICATIONS: Outpatient Medications Prior to Visit  Medication Sig Dispense Refill   aspirin 81 MG EC tablet Take 1 tablet (81 mg total) by mouth daily. 30 tablet 12   atorvastatin (LIPITOR) 40 MG tablet TAKE 1 TABLET BY MOUTH  DAILY (Patient taking differently: 20 mg.) 90 tablet 1   Blood Glucose Monitoring Suppl (BLOOD GLUCOSE MONITOR SYSTEM) w/Device KIT 1 each by Does not apply route daily. Use as directed to check blood sugar once daily 1 each 0   diclofenac sodium (VOLTAREN) 1 % GEL APPLY 2 G TOPICALLY 3 (THREE) TIMES DAILY AS NEEDED (ANTI INFLAMMATORY). 100 g 1   gabapentin (NEURONTIN) 300 MG capsule TAKE 1 CAPSULE BY MOUTH 3  TIMES DAILY 270 capsule 3  Glucose Blood (BLOOD GLUCOSE TEST STRIPS) STRP 1 each by In Vitro route daily. Use as directed to check blood sugar once daily 100 each 0   ibuprofen (ADVIL,MOTRIN) 200 MG tablet Take 200 mg by mouth as needed for mild pain (DEPENDS ON PAIN IF TAKES 400-600 MG).     Lancets MISC 1 each by Does not apply route daily. Use as directed to check blood sugar daily 100 each 0   losartan (COZAAR) 50 MG tablet Take 1 tablet (50 mg total) by mouth daily. 30 tablet 11   metFORMIN (GLUCOPHAGE) 1000 MG tablet TAKE 1 TABLET BY MOUTH  TWICE DAILY WITH A MEAL 180 tablet 1   nitroGLYCERIN (NITROSTAT) 0.4 MG SL tablet Place 1 tablet (0.4 mg total) under the tongue every 5 (five) minutes as needed for chest pain (do not take  more than 3 doses.). 75 tablet 1   No facility-administered medications prior to visit.    PAST MEDICAL HISTORY: Past Medical History:  Diagnosis Date   Arthritis    CAD (coronary artery disease)    cathx3 with nonobstructive disease. Last cath with RCA 40% stenosis in 2009   Cancer [facility])    Cataract 2019   bilateral; resolved with surgery   Colonic polyp    Fatty liver disease, nonalcoholic 1941   by [place]   GERD (gastroesophageal reflux disease)    History of chicken pox    Hyperlipidemia    Hypertension    Nocturia    OSA (obstructive sleep apnea)    CPAP, compliant   Paget's disease of bony pelvis    Past use of tobacco    Quit 1990, 70 pack year history   Rash of genital area    09-08-2014  per pt Dr Junious Silk aware   Seasonal and perennial allergic rhinitis    Type 2 diabetes mellitus ([place])    Wears dentures    full upper/  partial lower   Wears glasses     PAST SURGICAL HISTORY: Past Surgical History:  Procedure Laterality Date   ABDOMINAL HERNIA REPAIR  2007      [place]   open repair   APPENDECTOMY  age 79   CARDIAC CATHETERIZATION  12-23-2007   [place]   Abnormal myoview w/ ischemia/  40% mRCA with nonobstructive and no sig. plaque in his left system, EF 55%   CARDIAC CATHETERIZATION  Apr 2008    [facility]   Abnormal myoview/  50% RCA,  ef 65%   CARDIAC CATHETERIZATION  1999      [facility]   CATARACT EXTRACTION, BILATERAL Bilateral 11/2017   CERVICAL FUSION  1992   CHOLECYSTECTOMY OPEN  2006   COLONOSCOPY WITH PROPOFOL N/A 04/17/2016   TAs, high grade dysplasia with margins clear, diverticulosis Lucilla Lame, MD)   COLONOSCOPY WITH PROPOFOL N/A 08/07/2016   TAx1, diverticulosis, rpt 3 yrs ([facility])   COLONOSCOPY WITH PROPOFOL N/A 01/05/2020   SSP, rpt Allen Norris, Darren, MD)   DENTAL SURGERY     metal dental implant L mandible   EXCISION OF SKIN TAG Right 09/14/2014   Procedure: EXCISION OF SKIN TAG;  Surgeon: Festus Aloe, MD;  [facility];   Service: Urology;  Laterality: Right;   GROIN MASS OPEN BIOPSY Left 01/17/2019   HYDROCELE EXCISION Left 09/14/2014   Procedure: LEFT HYDROCELECTOMY ADULT;  Surgeon: Festus Aloe, MD;  Location: [facility];  Service: Urology;  Laterality: Left;   MOHS SURGERY  2015   skin cancer   TONSILLECTOMY  age 27    FAMILY HISTORY: Family History  Problem Relation Age of Onset   CAD Mother        MI   Diabetes Mother    Stroke Mother        mini-stroke   Cancer Father 29       lung (smoker)   Cancer Sister        lung   Heart disease Paternal Grandmother    Heart disease Paternal Grandfather    Diabetes Paternal Grandfather    Coronary artery disease Neg Hx        Premature   Sleep apnea Neg Hx     SOCIAL HISTORY: Social History   Socioeconomic History   Marital status: Married    Spouse name: Not on file   Number of children: Not on file   Years of education: Not on file   Highest education level: Not on file  Occupational History   Occupation: Full time    Employer: [employer]  Tobacco Use   Smoking status: Former    Packs/day: 2.00    Years: 35.00    Pack years: 70.00    Types: Cigarettes    Quit date: 01/29/1990    Years since quitting: 30.9   Smokeless tobacco: Never  Vaping Use   Vaping Use: Never used  Substance and Sexual Activity   Alcohol use: No   Drug use: No   Sexual activity: Yes  Other Topics Concern   Not on file  Social History Narrative   Lives with wife, dog and cats    Occupation: retired, was self employed, now works at [employer]    Edu: HS   Activity: walks 1.5 mi daily   Diet: some water, fruits/vegetables daily   Social Determinants of Radio broadcast assistant Strain: Low Risk    Difficulty of Paying Living Expenses: Not hard at all  Food Insecurity: No Food Insecurity   Worried About Charity fundraiser in the Last Year: Never true   Arboriculturist in the Last Year: Never true  Transportation Needs: No  Transportation Needs   Lack of Transportation (Medical): No   Lack of Transportation (Non-Medical): No  Physical Activity: Sufficiently Active   Days of Exercise per Week: 7 days   Minutes of Exercise per Session: 30 min  Stress: No Stress Concern Present   Feeling of Stress : Not at all  Social Connections: Not on file  Intimate Partner Violence: Not At Risk   Fear of Current or Ex-Partner: No   Emotionally Abused: No   Physically Abused: No   Sexually Abused: No      PHYSICAL EXAM  Vitals:   12/27/20 1116  BP: (!) 157/85  Pulse: 75  Weight: 245 lb (111.1 kg)  Height: _0  (1.753 m)   Body mass index is 36.18 kg/m.  Generalized: Well developed, in no acute distress  Chest: Lungs clear to auscultation bilaterally  Neurological examination  Mentation: Alert oriented to time, place, history taking. Follows all commands speech and language fluent Cranial nerve II-XII: Extraocular movements were full, visual field were full on confrontational test Head turning and shoulder shrug  were normal and symmetric. Motor: The motor testing reveals 5 over 5 strength of all 4 extremities. Good symmetric motor tone is noted throughout.  Sensory: Sensory testing is intact to soft touch on all 4 extremities. No evidence of extinction is noted.  Gait and station: Gait is normal.  DIAGNOSTIC DATA (LABS, IMAGING, TESTING) - I reviewed patient records, labs, notes, testing and imaging myself where available.  Lab Results  Component Value Date   WBC 12.2 (H) 12/07/2020   HGB 17.3 (H) 12/07/2020   HCT 48.5 12/07/2020   MCV 97.6 12/07/2020   PLT 220 12/07/2020      Component Value Date/Time   NA 133 (L) 12/07/2020 1629   NA 139 07/21/2014 0935   NA 140 12/05/2013 0904   K 4.0 12/07/2020 1629   K 4.3 12/05/2013 0904   CL 99 12/07/2020 1629   CL 101 12/05/2013 0904   CO2 24 12/07/2020 1629   CO2 32 12/05/2013 0904   GLUCOSE 145 (H) 12/07/2020 1629   GLUCOSE 157 (H) 12/05/2013  0904   BUN 20 12/07/2020 1629   BUN 13 07/21/2014 0935   BUN 19 (H) 12/05/2013 0904   CREATININE 0.77 12/07/2020 1629   CREATININE 1.29 12/05/2013 0904   CALCIUM 9.0 12/07/2020 1629   CALCIUM 9.2 12/05/2013 0904   PROT 7.9 12/07/2020 1629   PROT 7.2 07/21/2014 0935   PROT 7.3 12/05/2013 0904   ALBUMIN 4.3 12/07/2020 1629   ALBUMIN 4.4 07/21/2014 0935   ALBUMIN 3.7 12/05/2013 0904   AST 47 (H) 12/07/2020 1629   AST 35 12/05/2013 0904   ALT 44 12/07/2020 1629   ALT 55 12/05/2013 0904   ALKPHOS 60 12/07/2020 1629   ALKPHOS 60 12/05/2013 0904   BILITOT 1.2 12/07/2020 1629   BILITOT 0.8 07/21/2014 0935   BILITOT 0.6 12/05/2013 0904   GFRNONAA >60 12/07/2020 1629   GFRNONAA 59 (L) 12/05/2013 0904   GFRAA >60 01/17/2019 0224   GFRAA >60 12/05/2013 0904   Lab Results  Component Value Date   CHOL 119 07/05/2020   HDL 30.90 (L) 07/05/2020   LDLCALC 43 03/21/2017   LDLDIRECT 45.0 07/05/2020   TRIG 283.0 (H) 07/05/2020   CHOLHDL 4 07/05/2020   Lab Results  Component Value Date   HGBA1C 6.5 07/05/2020   Lab Results  Component Value Date   VITAMINB12 378 09/05/2015   No results found for: TSH    ASSESSMENT AND PLAN 70 y.o. year old male  has a past medical history of Arthritis, CAD (coronary artery disease), Cancer ([place]), Cataract (2019), Colonic polyp, Fatty liver disease, nonalcoholic (0277), GERD (gastroesophageal reflux disease), History of chicken pox, Hyperlipidemia, Hypertension, Nocturia, OSA (obstructive sleep apnea), Paget's disease of bony pelvis, Past use of tobacco, Rash of genital area, Seasonal and perennial allergic rhinitis, Type 2 diabetes mellitus ([place]), Wears dentures, and Wears glasses. here with:  OSA on CPAP  -Advised patient that I would like him to restart CPAP therapy.  I have reached out to the manager of DME company and he states that he will contact the patient -Reviewed risk of untreated sleep apnea with the patient  Patient mentions 2  hospitalizations today during our visit.  I was unaware that the patient was expecting to discuss these hospitalizations during our visit today.   We did not receive any new referrals from the ED or PCP.  Advised patient that I will review the chart and then our office would reach out and schedule him appropriately.  Patient was very understanding.   Ward Givens, MSN, NP-C 12/27/2020, 1:58 PM [facility] [address] [place], [place] [address] [phone]

## 2020-12-29 ENCOUNTER — Telehealth: Payer: Self-pay | Admitting: Adult Health

## 2020-12-29 NOTE — Telephone Encounter (Signed)
Pt states him and his wife tried to go into my chart for the information [name], NP wanted him to have but he and his wife were unable to obtain the information.  Pt is asking to be called with whatever [name], NP wanted him to be aware of.

## 2020-12-29 NOTE — Telephone Encounter (Signed)
I called pt and he was not able to get the AVS from mychart, I printed and mailed to him.  He was appreciative.

## 2021-01-03 NOTE — Telephone Encounter (Signed)
I spoke to pt.  I mailed AVS to him.  Wife and pt, states it relayed to MRI results? We did not order MRI, but I do see results from MRI,CT from other providers.  Im not sure that is what he is speaking of but he said MM/NP would know.  We saw for cpap visit.

## 2021-01-03 NOTE — Telephone Encounter (Signed)
Pt called, would like a call from the nurse to discuss test.

## 2021-01-04 ENCOUNTER — Telehealth: Payer: Self-pay | Admitting: Family Medicine

## 2021-01-04 NOTE — Telephone Encounter (Signed)
Pt called requesting a call back regarding test result . [phone]

## 2021-01-04 NOTE — Telephone Encounter (Addendum)
See new phone note

## 2021-01-04 NOTE — Telephone Encounter (Signed)
Received message from patient "Pt wants to make Dr. Darnell Level aware he saw neuro and wanted Dr. Darnell Level to look at notes to see what was talked about and what was done."  He had 2 recent ER visits for fall in yard then vasovagal episode on commode. I saw him on 11/25/2020 between 2 ER visits - at that time I thought fall was more mechanical (lost balance while working on pepper plants). We changed BP meds to losartan 50mg  daily (stopped atenolol).   He recently saw neurology for f/u OSA - rec restart CPAP. Was advised would need new referral for evaluation of recent ER visits.   Please touch base with patient - how has he been feeling and how have blood pressures been running? If no recurrent lightheadedness or unsteadiness and BPs running good, I don't think he needs formal neurological evaluation. If ongoing lightheaded, dizzy, falls, let me know and I will place new neuro referral. If ongoing chest pain, recommend he call cardiology for sooner f/u.

## 2021-01-04 NOTE — Telephone Encounter (Signed)
Reviewed chart. New hospitalization. Need appt with MD but a new referral most likely will be needed.

## 2021-01-04 NOTE — Telephone Encounter (Signed)
Spoke with pt to get clarification of message.  Pt wants to make Dr. Darnell Level aware he saw neuro and wanted Dr. Darnell Level to look at notes to see what was talked about and what was done.

## 2021-01-05 NOTE — Telephone Encounter (Addendum)
Spoke with pt but states he cannot talk right now because he's with a customer.  Says he will call back.

## 2021-01-05 NOTE — Telephone Encounter (Signed)
I called pt back.  I relayed to him that we are happy to see him for new issues, will need referral from pcp for these and then will see MD for new problems.   He stated that he has spoken to pcp office, RN and hopeful to hear back from MD to move forward if pcp feels that it is necessary.  I again reiterated need new referral for new problems and will see MD and happy to do so.  He verbalized understanding.

## 2021-01-06 NOTE — Telephone Encounter (Signed)
Lvm asking pt to call back.  Need to relay Dr. Synthia Innocent message.   Message: He recently saw neurology for f/u OSA - rec restart CPAP. Was advised would need new referral for evaluation of recent ER visits.  Please touch base with patient - how has he been feeling and how have blood pressures been running? If no recurrent lightheadedness or unsteadiness and BPs running good, I don't think he needs formal neurological evaluation. If ongoing lightheaded, dizzy, falls, let me know and I will place new neuro referral. If ongoing chest pain, recommend he call cardiology for sooner f/u.

## 2021-01-09 NOTE — Telephone Encounter (Signed)
Lvm asking pt to call back.  Need to relay Dr. Synthia Innocent message.   Message: He recently saw neurology for f/u OSA - rec restart CPAP. Was advised would need new referral for evaluation of recent ER visits.  Please touch base with patient - how has he been feeling and how have blood pressures been running? If no recurrent lightheadedness or unsteadiness and BPs running good, I don't think he needs formal neurological evaluation. If ongoing lightheaded, dizzy, falls, let me know and I will place new neuro referral. If ongoing chest pain, recommend he call cardiology for sooner f/u.

## 2021-01-10 NOTE — Telephone Encounter (Signed)
Lvm asking pt to call back.  Need to relay Dr. Synthia Innocent message.   Message: He recently saw neurology for f/u OSA - rec restart CPAP. Was advised would need new referral for evaluation of recent ER visits.  Please touch base with patient - how has he been feeling and how have blood pressures been running? If no recurrent lightheadedness or unsteadiness and BPs running good, I don't think he needs formal neurological evaluation. If ongoing lightheaded, dizzy, falls, let me know and I will place new neuro referral. If ongoing chest pain, recommend he call cardiology for sooner f/u.

## 2021-01-11 NOTE — Telephone Encounter (Signed)
Patient states that his BP is fair. Doesn't log it but Just now it was 166/81. He states that he does not have any dizziness or lightheadedness. He will let us know if he develops it again. He states no chest pain but if he does he will call cardiology.

## 2021-02-01 ENCOUNTER — Emergency Department
Admission: EM | Admit: 2021-02-01 | Discharge: 2021-02-01 | Disposition: A | Discharge status: Home / Self Care | Payer: Medicare Other | Attending: Emergency Medicine | Admitting: Emergency Medicine

## 2021-02-01 ENCOUNTER — Other Ambulatory Visit: Payer: Self-pay

## 2021-02-01 ENCOUNTER — Emergency Department: Payer: Medicare Other

## 2021-02-01 DIAGNOSIS — R0902 Hypoxemia: Secondary | ICD-10-CM | POA: Diagnosis not present

## 2021-02-01 DIAGNOSIS — R0789 Other chest pain: Secondary | ICD-10-CM | POA: Insufficient documentation

## 2021-02-01 DIAGNOSIS — R079 Chest pain, unspecified: Secondary | ICD-10-CM | POA: Diagnosis not present

## 2021-02-01 DIAGNOSIS — Z5321 Procedure and treatment not carried out due to patient leaving prior to being seen by health care provider: Secondary | ICD-10-CM | POA: Diagnosis not present

## 2021-02-01 DIAGNOSIS — R6889 Other general symptoms and signs: Secondary | ICD-10-CM | POA: Diagnosis not present

## 2021-02-01 DIAGNOSIS — Z743 Need for continuous supervision: Secondary | ICD-10-CM | POA: Diagnosis not present

## 2021-02-01 LAB — CBC
HCT: 44.6 % (ref 39.0–52.0)
Hemoglobin: 15.8 g/dL (ref 13.0–17.0)
MCH: 34.6 pg — ABNORMAL HIGH (ref 26.0–34.0)
MCHC: 35.4 g/dL (ref 30.0–36.0)
MCV: 97.8 fL (ref 80.0–100.0)
Platelets: 259 10*3/uL (ref 150–400)
RBC: 4.56 MIL/uL (ref 4.22–5.81)
RDW: 12 % (ref 11.5–15.5)
WBC: 8.3 10*3/uL (ref 4.0–10.5)
nRBC: 0 % (ref 0.0–0.2)

## 2021-02-01 LAB — BASIC METABOLIC PANEL
Anion gap: 7 (ref 5–15)
BUN: 21 mg/dL (ref 8–23)
CO2: 29 mmol/L (ref 22–32)
Calcium: 9.5 mg/dL (ref 8.9–10.3)
Chloride: 100 mmol/L (ref 98–111)
Creatinine, Ser: 0.79 mg/dL (ref 0.61–1.24)
GFR, Estimated: >60 mL/min (ref >60)
Glucose, Bld: 124 mg/dL — ABNORMAL HIGH (ref 70–99)
Potassium: 4.1 mmol/L (ref 3.5–5.1)
Sodium: 136 mmol/L (ref 135–145)

## 2021-02-01 LAB — TROPONIN I (HIGH SENSITIVITY): Troponin I (High Sensitivity): 6 ng/L (ref <18)

## 2021-02-01 NOTE — ED Triage Notes (Signed)
Pt comes with c/o CP that started today. Pt states discomfort. Pt denies any SOb.  Pt states it was left sided CP

## 2021-02-01 NOTE — ED Provider Triage Note (Signed)
Emergency Medicine Provider Triage Evaluation Note  Casey Golden, a 71 y.o. male  was evaluated in triage.  Pt complains of central and left-sided chest pain with significant increase today.  Patient reports currently symptoms have all but resolved.  He initially experienced an increase in pain which prompted him to report to a local fire department.  They performed a EKG there and took his blood pressure and he suggested he report to the ED for further evaluation.  Patient presents via EMS with symptoms improved.  He would endorse intermittent symptoms over the last 2 weeks.  He denies any syncope, diaphoresis, nausea, vomiting, or shortness of breath.  Review of Systems  Positive: CP Negative: FCS  Physical Exam  BP (!) 156/92 (BP Location: Right Arm)    Pulse 72    Temp 98.4 F (36.9 C) (Oral)    Resp 18    Ht 5\' 10"  (1.778 m)    Wt 110.2 kg    SpO2 95%    BMI 34.87 kg/m  Gen:   Awake, no distress   Resp:  Normal effort CTA MSK:   Moves extremities without difficulty  Other:  CVS: RRR  Medical Decision Making  Medically screening exam initiated at 5:50 PM.  Appropriate orders placed.  [name] was informed that the remainder of the evaluation will be completed by another provider, this initial triage assessment does not replace that evaluation, and the importance of remaining in the ED until their evaluation is complete.  Geriatric patient with prior AMI history, presents to the ED for evaluation of intermit episodes of central chest pain, with a slight increase today.  He presents via EMS from the fire station with symptoms nearly resolved.   [name], PA-C 02/01/21 1752

## 2021-02-02 ENCOUNTER — Telehealth: Payer: Self-pay | Admitting: Family Medicine

## 2021-02-02 NOTE — Telephone Encounter (Signed)
Please advise pt that Dr. Darnell Level is out of the office today and since pt did not stay in ED until seen by a provider, then pt needs to schedule a follow-up appt.

## 2021-02-02 NOTE — Telephone Encounter (Signed)
Pt called stating that he would like for you to give him a call back to discuss his ED visit he had on yesterday. Please advise.

## 2021-02-02 NOTE — Telephone Encounter (Signed)
Called pt he said that he was going to follow up with Cardiology,

## 2021-02-05 ENCOUNTER — Other Ambulatory Visit: Payer: Self-pay | Admitting: Family Medicine

## 2021-02-05 DIAGNOSIS — E1169 Type 2 diabetes mellitus with other specified complication: Secondary | ICD-10-CM

## 2021-02-05 DIAGNOSIS — R972 Elevated prostate specific antigen [PSA]: Secondary | ICD-10-CM

## 2021-02-05 DIAGNOSIS — C4499 Other specified malignant neoplasm of skin, unspecified: Secondary | ICD-10-CM

## 2021-02-05 DIAGNOSIS — E118 Type 2 diabetes mellitus with unspecified complications: Secondary | ICD-10-CM

## 2021-02-08 ENCOUNTER — Ambulatory Visit (INDEPENDENT_AMBULATORY_CARE_PROVIDER_SITE_OTHER): Payer: Medicare Other | Admitting: Family Medicine

## 2021-02-08 ENCOUNTER — Encounter: Payer: Self-pay | Admitting: Family Medicine

## 2021-02-08 ENCOUNTER — Other Ambulatory Visit: Payer: Self-pay

## 2021-02-08 VITALS — BP 140/76 | HR 78 | Temp 97.7°F | Ht 70.0 in | Wt 242.0 lb

## 2021-02-08 DIAGNOSIS — I25118 Atherosclerotic heart disease of native coronary artery with other forms of angina pectoris: Secondary | ICD-10-CM

## 2021-02-08 DIAGNOSIS — E118 Type 2 diabetes mellitus with unspecified complications: Secondary | ICD-10-CM

## 2021-02-08 DIAGNOSIS — E785 Hyperlipidemia, unspecified: Secondary | ICD-10-CM | POA: Diagnosis not present

## 2021-02-08 DIAGNOSIS — I209 Angina pectoris, unspecified: Secondary | ICD-10-CM

## 2021-02-08 DIAGNOSIS — I10 Essential (primary) hypertension: Secondary | ICD-10-CM | POA: Diagnosis not present

## 2021-02-08 DIAGNOSIS — I440 Atrioventricular block, first degree: Secondary | ICD-10-CM

## 2021-02-08 DIAGNOSIS — E66811 Obesity, class 1: Secondary | ICD-10-CM

## 2021-02-08 DIAGNOSIS — E669 Obesity, unspecified: Secondary | ICD-10-CM

## 2021-02-08 DIAGNOSIS — E1169 Type 2 diabetes mellitus with other specified complication: Secondary | ICD-10-CM

## 2021-02-08 LAB — POCT GLYCOSYLATED HEMOGLOBIN (HGB A1C): Hemoglobin A1C: 6.2 % — AB (ref 4.0–5.6)

## 2021-02-08 MED ORDER — NITROGLYCERIN 0.4 MG SL SUBL: 0.4000 mg | SUBLINGUAL_TABLET | SUBLINGUAL | 1 refills | Status: DC | PRN

## 2021-02-08 NOTE — Assessment & Plan Note (Signed)
Chronic, adequate

## 2021-02-08 NOTE — Patient Instructions (Addendum)
Restart atorvastatin.  We will refer you back to Dr Rockey Situ - I want you to see the heart doctor in the next several days  If recurrent or worsening chest pain, call 911 or go immediately to the ER.  Nitroglycerin refilled today.

## 2021-02-08 NOTE — Assessment & Plan Note (Signed)
Off atenolol due to this, prolonged PR has improved.

## 2021-02-08 NOTE — Assessment & Plan Note (Signed)
A1c remains well controlled.

## 2021-02-08 NOTE — Progress Notes (Signed)
Patient ID: Casey Golden, male    DOB: 1950/08/07, 71 y.o.   MRN: [id number]  This visit was conducted in person.  BP 140/76    Pulse 78    Temp 97.7 F (36.5 C) (Temporal)    Ht _0  (1.778 m)    Wt 242 lb (109.8 kg)    SpO2 94%    BMI 34.72 kg/m   BP Readings from Last 3 Encounters:  02/08/21 140/76  02/01/21 (!) 156/92  12/27/20 (!) 157/85   CC: ER visit f/u Subjective:   HPI: Casey Golden is a 71 y.o. male presenting on 02/08/2021 for Hospitalization Follow-up (Seen at [facility] ED on 12/07/20, dx vaso vagal episode.  Pt went to [facility] ED again on 02/01/21 but did not stay.  States chest feels good today.  But feels some pressure on top of head, like a wet blanket. )   Recent ER visit 02/01/2021 for left sided chest pain. Had reassuring labs including CBC, BMP, TnI and CXR. Left without being seen due to prolonged wait time. EKG without acute changes. States told by front office staff he should leave.   Describes he started having chest pressure pain after walking on treadmill on an incline. This was associated with dyspnea but no nausea, jaw or left arm pain.   Note some left upper lip numbness over the past week.   Atenolol was changed to losartan 71m daily 10/2020.  He was unclear about instructions and also stopped atorvastatin.   Last saw cardiology 04/2020. Known h/o MI CAD by cardiac catheterization 2009. H/o remote MI 1999.  He is out of nitroglycerin and requests a refill.      Relevant past medical, surgical, family and social history reviewed and updated as indicated. Interim medical history since our last visit reviewed. Allergies and medications reviewed and updated. Outpatient Medications Prior to Visit  Medication Sig Dispense Refill   aspirin 81 MG EC tablet Take 1 tablet (81 mg total) by mouth daily. 30 tablet 12   Blood Glucose Monitoring Suppl (BLOOD GLUCOSE MONITOR SYSTEM) w/Device KIT 1 each by Does not apply route daily. Use as directed to check blood  sugar once daily 1 each 0   diclofenac sodium (VOLTAREN) 1 % GEL APPLY 2 G TOPICALLY 3 (THREE) TIMES DAILY AS NEEDED (ANTI INFLAMMATORY). 100 g 1   gabapentin (NEURONTIN) 300 MG capsule TAKE 1 CAPSULE BY MOUTH 3  TIMES DAILY 270 capsule 3   Glucose Blood (BLOOD GLUCOSE TEST STRIPS) STRP 1 each by In Vitro route daily. Use as directed to check blood sugar once daily 100 each 0   ibuprofen (ADVIL,MOTRIN) 200 MG tablet Take 200 mg by mouth as needed for mild pain (DEPENDS ON PAIN IF TAKES 400-600 MG).     Lancets MISC 1 each by Does not apply route daily. Use as directed to check blood sugar daily 100 each 0   losartan (COZAAR) 50 MG tablet Take 1 tablet (50 mg total) by mouth daily. 30 tablet 11   metFORMIN (GLUCOPHAGE) 1000 MG tablet TAKE 1 TABLET BY MOUTH  TWICE DAILY WITH A MEAL 180 tablet 1   atorvastatin (LIPITOR) 40 MG tablet TAKE 1 TABLET BY MOUTH  DAILY (Patient not taking: Reported on 02/08/2021) 90 tablet 1   nitroGLYCERIN (NITROSTAT) 0.4 MG SL tablet Place 1 tablet (0.4 mg total) under the tongue every 5 (five) minutes as needed for chest pain (do not take more than 3 doses.). 75 tablet 1  No facility-administered medications prior to visit.     Per HPI unless specifically indicated in ROS section below Review of Systems  Objective:  BP 140/76    Pulse 78    Temp 97.7 F (36.5 C) (Temporal)    Ht _0  (1.778 m)    Wt 242 lb (109.8 kg)    SpO2 94%    BMI 34.72 kg/m   Wt Readings from Last 3 Encounters:  02/08/21 242 lb (109.8 kg)  02/01/21 243 lb (110.2 kg)  12/27/20 245 lb (111.1 kg)      Physical Exam Vitals and nursing note reviewed.  Constitutional:      Appearance: Normal appearance. He is not ill-appearing.  Cardiovascular:     Rate and Rhythm: Normal rate and regular rhythm.     Pulses: Normal pulses.     Heart sounds: Normal heart sounds. No murmur heard. Pulmonary:     Effort: Pulmonary effort is normal. No respiratory distress.     Breath sounds: Normal breath  sounds. No wheezing, rhonchi or rales.  Musculoskeletal:     Right lower leg: No edema.     Left lower leg: No edema.  Skin:    General: Skin is warm and dry.     Coloration: Skin is not jaundiced.  Neurological:     Mental Status: He is alert.  Psychiatric:        Mood and Affect: Mood normal.        Behavior: Behavior normal.      Results for orders placed or performed in visit on 02/08/21  POCT glycosylated hemoglobin (Hb A1C)  Result Value Ref Range   Hemoglobin A1C 6.2 (A) 4.0 - 5.6 %   HbA1c POC (<> result, manual entry)     HbA1c, POC (prediabetic range)     HbA1c, POC (controlled diabetic range)     Lab Results  Component Value Date   CHOL 119 07/05/2020   HDL 30.90 (L) 07/05/2020   LDLCALC 43 03/21/2017   LDLDIRECT 45.0 07/05/2020   TRIG 283.0 (H) 07/05/2020   CHOLHDL 4 07/05/2020    EKG - NSR rate 75, normal axis, intervals, no hypertrophy or acute ST/T changes, q waves anteriorly  Assessment & Plan:  This visit occurred during the SARS-CoV-2 public health emergency.  Safety protocols were in place, including screening questions prior to the visit, additional usage of staff PPE, and extensive cleaning of exam room while observing appropriate contact time as indicated for disinfecting solutions.   Problem List Items Addressed This Visit     HTN (hypertension)    Chronic, adequate      Relevant Medications   nitroGLYCERIN (NITROSTAT) 0.4 MG SL tablet   CAD (coronary artery disease), native coronary artery    H/o this - continues aspirin daily. Will restart atorvastatin. See below.       Relevant Medications   nitroGLYCERIN (NITROSTAT) 0.4 MG SL tablet   Other Relevant Orders   Ambulatory referral to Cardiology   Angina pectoris [facility]) - Primary    Describes concerning exertional chest pressure in known CAD and remote h/o MI, in setting of stopping statin several months ago.  Red flags to call 911 or seek ER care reviewed.  Recommend no exertion until seen  by cardiology. SL nitroglycerin refilled in interim.       Relevant Medications   nitroGLYCERIN (NITROSTAT) 0.4 MG SL tablet   Other Relevant Orders   Ambulatory referral to Cardiology   EKG 12-Lead (Completed)  Controlled diabetes mellitus type 2 with complications (HCC)    T9B remains well controlled.      Relevant Orders   POCT glycosylated hemoglobin (Hb A1C) (Completed)   Dyslipidemia associated with type 2 diabetes mellitus ([place])    Confusion over instructions from last visit - it seems he stopped both atenolol and atorvastatin - advised restart atorvastatin right away.       Obesity, Class I, BMI 30-34.9   First degree AV block    Off atenolol due to this, prolonged PR has improved.       Relevant Medications   nitroGLYCERIN (NITROSTAT) 0.4 MG SL tablet     Meds ordered this encounter  Medications   nitroGLYCERIN (NITROSTAT) 0.4 MG SL tablet    Sig: Place 1 tablet (0.4 mg total) under the tongue every 5 (five) minutes as needed for chest pain (do not take more than 3 doses.).    Dispense:  50 tablet    Refill:  1   Orders Placed This Encounter  Procedures   Ambulatory referral to Cardiology    Referral Priority:   Urgent    Referral Type:   Consultation    Referral Reason:   Specialty Services Required    Requested Specialty:   Cardiology    Number of Visits Requested:   1   POCT glycosylated hemoglobin (Hb A1C)   EKG 12-Lead     Patient Instructions  Restart atorvastatin.  We will refer you back to Dr Rockey Situ - I want you to see the heart doctor in the next several days  If recurrent or worsening chest pain, call 911 or go immediately to the ER.  Nitroglycerin refilled today.   Follow up plan: Return if symptoms worsen or fail to improve.  Ria Bush, MD

## 2021-02-08 NOTE — Assessment & Plan Note (Signed)
Confusion over instructions from last visit - it seems he stopped both atenolol and atorvastatin - advised restart atorvastatin right away.

## 2021-02-08 NOTE — Assessment & Plan Note (Signed)
H/o this - continues aspirin daily. Will restart atorvastatin. See below.

## 2021-02-08 NOTE — Assessment & Plan Note (Addendum)
Describes concerning exertional chest pressure in known CAD and remote h/o MI, in setting of stopping statin several months ago.  Red flags to call 911 or seek ER care reviewed.  Recommend no exertion until seen by cardiology. SL nitroglycerin refilled in interim.

## 2021-02-09 NOTE — Telephone Encounter (Signed)
No, pt does not need labs before seeing Dr. Rockey Situ.  That lab appt is for his CPE with Dr. Darnell Level on 02/17/21.  So pt is fine to have labs done on Tues.  He will just need to fast 4-5 hrs before labs (only water and/or black coffee).

## 2021-02-09 NOTE — Telephone Encounter (Signed)
Spoke with patient, relayed message below, patient understood.

## 2021-02-09 NOTE — Telephone Encounter (Signed)
Patient had a lab appointment scheduled for Friday 1.13.23, wanted to come to [facility] but they have no lab appts until Tuesday afternoon. Patient wanted to know if he needed the labs done for his visit with Dr. Rockey Situ on Monday 1.16.23 or is okay to wait until he sees Dr. Darnell Level on Thursday 1.20.22 for his physical  Please advise

## 2021-02-10 ENCOUNTER — Other Ambulatory Visit: Payer: Medicare Other

## 2021-02-12 NOTE — Progress Notes (Signed)
Cardiology Office Note  Date:  02/13/2021   ID:  Casey Golden, DOB 1950/04/23, MRN [id number]  PCP:  Casey Bush, MD   Chief Complaint  Patient presents with   Exertional chest pain     Medications reviewed by the patient verbally.     HPI:  Casey Golden is a very pleasant 71 year old gentleman with past medical history of  Former smoker, quit 1990,  smoking for 27 years,   Coronary artery disease  50% mid RCA disease by cardiac catheterization in 2009 , 30% LAD disease  mixed hyperlipidemia,  hypertension.  syncopal episode many years ago while walking his dog. He was down for one minute. He had no warning. He has not had any further episodes since that time Extramammary Paget's disease of scrotum Who presents for follow-up of his coronary artery disease  Last seen in clinic by myself April 2022 Since that time he reports having Multiple visits to the ER for chest pain in the past 3 months " Something is not right"  Last week Seen by EMTs, "abnormal EKG" They sent him to the ER After 8 hours, tnt negative x1 Left without being seen and has wait was too long Has continued to get periodic chest pain episodes  Feel like has a shower cap on his head Lip going numb  chest pain (worst on Jan 1st 2023) Reports having periodic episodes of chest pain waking him up at nighttime, also happening with exertion Happening now while using his exercise machine at home  Not having active chest pain  Labs reviewed A1C 6.2 Total chol 95, LDL 42  Works at [employer],. Part time Helps to take care of his grandchildren  EKG personally reviewed by myself on todays visit NSR rate rate 85 bpm, diffuse nonspcific ST abn , possibly more pronounced than prior EKGs  Other past medical history reviewed Seen by dermatology Extramammary Paget's disease of scrotum , surgery at [facility], MOHS Post op hematoma next day CT scan: 5.6 x 8.4 x 14.2 cm curvilinear hematoma extending from the  left inguinal region into the scrotal sac.   CT scan: degenerative changes throughout the spine associated with a thoracolumbar scoliosis. There is osseous foraminal narrowing in the lower lumbar spine, especially on the right at L4-5 and to the left at L5-S1.  Cardiac catheterization report 05/13/2006 detailing normal ejection fraction, 50% mid RCA disease    testicular surgery 09/14/2014 in [place] with Dr. Junious Silk.    positive Myoview several years ago which resulted in a heart catheterization.  He had nonobstructive disease. His normal left ventricular function.    PMH:   has a past medical history of Arthritis, CAD (coronary artery disease), Cancer ([place]), Cataract (2019), Colonic polyp, Fatty liver disease, nonalcoholic (5366), GERD (gastroesophageal reflux disease), History of chicken pox, Hyperlipidemia, Hypertension, Nocturia, OSA (obstructive sleep apnea), Paget's disease of bony pelvis, Past use of tobacco, Rash of genital area, Seasonal and perennial allergic rhinitis, Type 2 diabetes mellitus ([place]), Wears dentures, and Wears glasses.  PSH:    Past Surgical History:  Procedure Laterality Date   ABDOMINAL HERNIA REPAIR  2007      [facility]   open repair   APPENDECTOMY  age 56   CARDIAC CATHETERIZATION  12-23-2007   [facility]   Abnormal myoview w/ ischemia/  40% mRCA with nonobstructive and no sig. plaque in his left system, EF 55%   CARDIAC CATHETERIZATION  Apr 2008    [facility]   Abnormal myoview/  50% RCA,  ef 65%   CARDIAC CATHETERIZATION  1999      [facility]   CATARACT EXTRACTION, BILATERAL Bilateral 11/2017   CERVICAL FUSION  1992   CHOLECYSTECTOMY OPEN  2006   COLONOSCOPY WITH PROPOFOL N/A 04/17/2016   TAs, high grade dysplasia with margins clear, diverticulosis Lucilla Lame, MD)   COLONOSCOPY WITH PROPOFOL N/A 08/07/2016   TAx1, diverticulosis, rpt 3 yrs ([facility])   COLONOSCOPY WITH PROPOFOL N/A 01/05/2020   SSP, rpt Allen Norris, Darren, MD)   DENTAL SURGERY     metal dental implant L  mandible   EXCISION OF SKIN TAG Right 09/14/2014   Procedure: EXCISION OF SKIN TAG;  Surgeon: Festus Aloe, MD;  Location: [facility];  Service: Urology;  Laterality: Right;   GROIN MASS OPEN BIOPSY Left 01/17/2019   HYDROCELE EXCISION Left 09/14/2014   Procedure: LEFT HYDROCELECTOMY ADULT;  Surgeon: Festus Aloe, MD;  Location: [facility];  Service: Urology;  Laterality: Left;   MOHS SURGERY  2015   skin cancer   TONSILLECTOMY  age 15    Current Outpatient Medications  Medication Sig Dispense Refill   aspirin 81 MG EC tablet Take 1 tablet (81 mg total) by mouth daily. 30 tablet 12   atorvastatin (LIPITOR) 40 MG tablet TAKE 1 TABLET BY MOUTH  DAILY 90 tablet 1   Blood Glucose Monitoring Suppl (BLOOD GLUCOSE MONITOR SYSTEM) w/Device KIT 1 each by Does not apply route daily. Use as directed to check blood sugar once daily 1 each 0   diclofenac sodium (VOLTAREN) 1 % GEL APPLY 2 G TOPICALLY 3 (THREE) TIMES DAILY AS NEEDED (ANTI INFLAMMATORY). 100 g 1   gabapentin (NEURONTIN) 300 MG capsule TAKE 1 CAPSULE BY MOUTH 3  TIMES DAILY 270 capsule 3   Glucose Blood (BLOOD GLUCOSE TEST STRIPS) STRP 1 each by In Vitro route daily. Use as directed to check blood sugar once daily 100 each 0   ibuprofen (ADVIL,MOTRIN) 200 MG tablet Take 200 mg by mouth as needed for mild pain (DEPENDS ON PAIN IF TAKES 400-600 MG).     Lancets MISC 1 each by Does not apply route daily. Use as directed to check blood sugar daily 100 each 0   losartan (COZAAR) 50 MG tablet Take 1 tablet (50 mg total) by mouth daily. 30 tablet 11   metFORMIN (GLUCOPHAGE) 1000 MG tablet TAKE 1 TABLET BY MOUTH  TWICE DAILY WITH A MEAL 180 tablet 1   nitroGLYCERIN (NITROSTAT) 0.4 MG SL tablet Place 1 tablet (0.4 mg total) under the tongue every 5 (five) minutes as needed for chest pain (do not take more than 3 doses.). 50 tablet 1   No current facility-administered medications for this visit.     Allergies:    Elemental sulfur and Sulfa antibiotics   Social History:  The patient  reports that he quit smoking about 31 years ago. His smoking use included cigarettes. He has a 70.00 pack-year smoking history. He has never used smokeless tobacco. He reports that he does not drink alcohol and does not use drugs.   Family History:   family history includes CAD in his mother; Cancer in his sister; Cancer (age of onset: 28) in his father; Diabetes in his mother and paternal grandfather; Heart disease in his paternal grandfather and paternal grandmother; Stroke in his mother.    Review of Systems: Review of Systems  Constitutional: Negative.   HENT: Negative.    Respiratory: Negative.    Cardiovascular:  Positive for chest pain.  Gastrointestinal: Negative.   Musculoskeletal:  Positive for back pain and joint pain.  Neurological: Negative.   Psychiatric/Behavioral: Negative.    All other systems reviewed and are negative.  PHYSICAL EXAM: VS:  BP (!) 160/70 (BP Location: Left Arm, Patient Position: Sitting, Cuff Size: Normal)    Pulse 85    Ht _0  (1.803 m)    Wt 240 lb (108.9 kg)    SpO2 98%    BMI 33.47 kg/m  , BMI Body mass index is 33.47 kg/m. Constitutional:  oriented to person, place, and time. No distress.  HENT:  Head: Grossly normal Eyes:  no discharge. No scleral icterus.  Neck: No JVD, no carotid bruits  Cardiovascular: Regular rate and rhythm, no murmurs appreciated Pulmonary/Chest: Clear to auscultation bilaterally, no wheezes or rails Abdominal: Soft.  no distension.  no tenderness.  Musculoskeletal: Normal range of motion Neurological:  normal muscle tone. Coordination normal. No atrophy Skin: Skin warm and dry Psychiatric: normal affect, pleasant  Recent Labs: 02/01/2021: Hemoglobin 15.8; Platelets 259 02/13/2021: ALT 60; BUN 15; Creatinine, Ser 0.78; Potassium 4.6; Sodium 139    Lipid Panel Lab Results  Component Value Date   CHOL 117 02/13/2021   HDL 31.70 (L)  02/13/2021   LDLCALC 43 03/21/2017   TRIG 268.0 (H) 02/13/2021      Wt Readings from Last 3 Encounters:  02/13/21 240 lb (108.9 kg)  02/08/21 242 lb (109.8 kg)  02/01/21 243 lb (110.2 kg)      ASSESSMENT AND PLAN:  Coronary artery disease involving native coronary artery of native heart without angina pectoris -  Worsening chest pain symptoms, frequent trips to the emergency room for similar symptoms, most recently left without being seen after waiting 8 hours Chest pain at nighttime waking him Subtle EKG changes, diffuse ST abnormality Discussed various treatment options with him including invasive versus noninvasive testing After further discussion, he prefers cardiac catheterization I have reviewed the risks, indications, and alternatives to cardiac catheterization, possible angioplasty, and stenting with the patient. Risks include but are not limited to bleeding, infection, vascular injury, stroke, myocardial infection, arrhythmia, kidney injury, radiation-related injury in the case of prolonged fluoroscopy use, emergency cardiac surgery, and death. The patient understands the risks of serious complication is 1-2 in 3403 with diagnostic cardiac cath and 1-2% or less with angioplasty/stenting.  -If possible he would like to be scheduled in the next 2 days as [employer] is expecting him to return to work shortly after that Tentatively scheduled for January 18 with Dr. Saunders Revel  Essential hypertension Blood pressure is well controlled on today's visit. No changes made to the medications.  controlled type 2 diabetes with neuropathy (HCC) A1c 6.6 Not exercising given recent chest pain symptoms Recommend moderating carbohydrate intake  Osteoarthritis Chronic knee pain, back pain Long hours of working at [employer]  Chronic midline low back pain, with sciatica presence unspecified Previously seen by chiropractor Stable   Total encounter time more than 25 minutes  Greater than 50%  was spent in counseling and coordination of care with the patient   No orders of the defined types were placed in this encounter.    Signed, Esmond Plants, M.D., Ph.D. 02/13/2021  [place], [place]

## 2021-02-12 NOTE — H&P (View-Only) (Signed)
Cardiology Office Note  Date:  02/13/2021   ID:  Casey Golden, DOB 1950/04/23, MRN [id number]  PCP:  Ria Bush, MD   Chief Complaint  Patient presents with   Exertional chest pain     Medications reviewed by the patient verbally.     HPI:  Casey Golden is a very pleasant 71 year old gentleman with past medical history of  Former smoker, quit 1990,  smoking for 27 years,   Coronary artery disease  50% mid RCA disease by cardiac catheterization in 2009 , 30% LAD disease  mixed hyperlipidemia,  hypertension.  syncopal episode many years ago while walking his dog. He was down for one minute. He had no warning. He has not had any further episodes since that time Extramammary Paget's disease of scrotum Who presents for follow-up of his coronary artery disease  Last seen in clinic by myself April 2022 Since that time he reports having Multiple visits to the ER for chest pain in the past 3 months " Something is not right"  Last week Seen by EMTs, "abnormal EKG" They sent him to the ER After 8 hours, tnt negative x1 Left without being seen and has wait was too long Has continued to get periodic chest pain episodes  Feel like has a shower cap on his head Lip going numb  chest pain (worst on Jan 1st 2023) Reports having periodic episodes of chest pain waking him up at nighttime, also happening with exertion Happening now while using his exercise machine at home  Not having active chest pain  Labs reviewed A1C 6.2 Total chol 95, LDL 42  Works at [employer],. Part time Helps to take care of his grandchildren  EKG personally reviewed by myself on todays visit NSR rate rate 85 bpm, diffuse nonspcific ST abn , possibly more pronounced than prior EKGs  Other past medical history reviewed Seen by dermatology Extramammary Paget's disease of scrotum , surgery at [facility], [facility] Post op hematoma next day CT scan: 5.6 x 8.4 x 14.2 cm curvilinear hematoma extending from the  left inguinal region into the scrotal sac.   CT scan: degenerative changes throughout the spine associated with a thoracolumbar scoliosis. There is osseous foraminal narrowing in the lower lumbar spine, especially on the right at L4-5 and to the left at L5-S1.  Cardiac catheterization report 05/13/2006 detailing normal ejection fraction, 50% mid RCA disease    testicular surgery 09/14/2014 in [place] with Dr. Junious Silk.    positive Myoview several years ago which resulted in a heart catheterization.  He had nonobstructive disease. His normal left ventricular function.    PMH:   has a past medical history of Arthritis, CAD (coronary artery disease), Cancer ([place]), Cataract (2019), Colonic polyp, Fatty liver disease, nonalcoholic (5366), GERD (gastroesophageal reflux disease), History of chicken pox, Hyperlipidemia, Hypertension, Nocturia, OSA (obstructive sleep apnea), Paget's disease of bony pelvis, Past use of tobacco, Rash of genital area, Seasonal and perennial allergic rhinitis, Type 2 diabetes mellitus ([place]), Wears dentures, and Wears glasses.  PSH:    Past Surgical History:  Procedure Laterality Date   ABDOMINAL HERNIA REPAIR  2007      [facility]   open repair   APPENDECTOMY  age 56   CARDIAC CATHETERIZATION  12-23-2007   [facility]   Abnormal myoview w/ ischemia/  40% mRCA with nonobstructive and no sig. plaque in his left system, EF 55%   CARDIAC CATHETERIZATION  Apr 2008    [facility]   Abnormal myoview/  50% RCA,  ef 65%   CARDIAC CATHETERIZATION  1999      [facility]   CATARACT EXTRACTION, BILATERAL Bilateral 11/2017   CERVICAL FUSION  1992   CHOLECYSTECTOMY OPEN  2006   COLONOSCOPY WITH PROPOFOL N/A 04/17/2016   TAs, high grade dysplasia with margins clear, diverticulosis Lucilla Lame, MD)   COLONOSCOPY WITH PROPOFOL N/A 08/07/2016   TAx1, diverticulosis, rpt 3 yrs ([facility])   COLONOSCOPY WITH PROPOFOL N/A 01/05/2020   [facility], rpt Allen Norris, Darren, MD)   DENTAL SURGERY     metal dental implant L  mandible   EXCISION OF SKIN TAG Right 09/14/2014   Procedure: EXCISION OF SKIN TAG;  Surgeon: Festus Aloe, MD;  Location: [facility];  Service: Urology;  Laterality: Right;   GROIN MASS OPEN BIOPSY Left 01/17/2019   HYDROCELE EXCISION Left 09/14/2014   Procedure: LEFT HYDROCELECTOMY ADULT;  Surgeon: Festus Aloe, MD;  Location: [facility];  Service: Urology;  Laterality: Left;   MOHS SURGERY  2015   skin cancer   TONSILLECTOMY  age 15    Current Outpatient Medications  Medication Sig Dispense Refill   aspirin 81 MG EC tablet Take 1 tablet (81 mg total) by mouth daily. 30 tablet 12   atorvastatin (LIPITOR) 40 MG tablet TAKE 1 TABLET BY MOUTH  DAILY 90 tablet 1   Blood Glucose Monitoring Suppl (BLOOD GLUCOSE MONITOR SYSTEM) w/Device KIT 1 each by Does not apply route daily. Use as directed to check blood sugar once daily 1 each 0   diclofenac sodium (VOLTAREN) 1 % GEL APPLY 2 G TOPICALLY 3 (THREE) TIMES DAILY AS NEEDED (ANTI INFLAMMATORY). 100 g 1   gabapentin (NEURONTIN) 300 MG capsule TAKE 1 CAPSULE BY MOUTH 3  TIMES DAILY 270 capsule 3   Glucose Blood (BLOOD GLUCOSE TEST STRIPS) STRP 1 each by In Vitro route daily. Use as directed to check blood sugar once daily 100 each 0   ibuprofen (ADVIL,MOTRIN) 200 MG tablet Take 200 mg by mouth as needed for mild pain (DEPENDS ON PAIN IF TAKES 400-600 MG).     Lancets MISC 1 each by Does not apply route daily. Use as directed to check blood sugar daily 100 each 0   losartan (COZAAR) 50 MG tablet Take 1 tablet (50 mg total) by mouth daily. 30 tablet 11   metFORMIN (GLUCOPHAGE) 1000 MG tablet TAKE 1 TABLET BY MOUTH  TWICE DAILY WITH A MEAL 180 tablet 1   nitroGLYCERIN (NITROSTAT) 0.4 MG SL tablet Place 1 tablet (0.4 mg total) under the tongue every 5 (five) minutes as needed for chest pain (do not take more than 3 doses.). 50 tablet 1   No current facility-administered medications for this visit.     Allergies:    Elemental sulfur and Sulfa antibiotics   Social History:  The patient  reports that he quit smoking about 31 years ago. His smoking use included cigarettes. He has a 70.00 pack-year smoking history. He has never used smokeless tobacco. He reports that he does not drink alcohol and does not use drugs.   Family History:   family history includes CAD in his mother; Cancer in his sister; Cancer (age of onset: 28) in his father; Diabetes in his mother and paternal grandfather; Heart disease in his paternal grandfather and paternal grandmother; Stroke in his mother.    Review of Systems: Review of Systems  Constitutional: Negative.   HENT: Negative.    Respiratory: Negative.    Cardiovascular:  Positive for chest pain.  Gastrointestinal: Negative.   Musculoskeletal:  Positive for back pain and joint pain.  Neurological: Negative.   Psychiatric/Behavioral: Negative.    All other systems reviewed and are negative.  PHYSICAL EXAM: VS:  BP (!) 160/70 (BP Location: Left Arm, Patient Position: Sitting, Cuff Size: Normal)    Pulse 85    Ht _0  (1.803 m)    Wt 240 lb (108.9 kg)    SpO2 98%    BMI 33.47 kg/m  , BMI Body mass index is 33.47 kg/m. Constitutional:  oriented to person, place, and time. No distress.  HENT:  Head: Grossly normal Eyes:  no discharge. No scleral icterus.  Neck: No JVD, no carotid bruits  Cardiovascular: Regular rate and rhythm, no murmurs appreciated Pulmonary/Chest: Clear to auscultation bilaterally, no wheezes or rails Abdominal: Soft.  no distension.  no tenderness.  Musculoskeletal: Normal range of motion Neurological:  normal muscle tone. Coordination normal. No atrophy Skin: Skin warm and dry Psychiatric: normal affect, pleasant  Recent Labs: 02/01/2021: Hemoglobin 15.8; Platelets 259 02/13/2021: ALT 60; BUN 15; Creatinine, Ser 0.78; Potassium 4.6; Sodium 139    Lipid Panel Lab Results  Component Value Date   CHOL 117 02/13/2021   HDL 31.70 (L)  02/13/2021   LDLCALC 43 03/21/2017   TRIG 268.0 (H) 02/13/2021      Wt Readings from Last 3 Encounters:  02/13/21 240 lb (108.9 kg)  02/08/21 242 lb (109.8 kg)  02/01/21 243 lb (110.2 kg)      ASSESSMENT AND PLAN:  Coronary artery disease involving native coronary artery of native heart without angina pectoris -  Worsening chest pain symptoms, frequent trips to the emergency room for similar symptoms, most recently left without being seen after waiting 8 hours Chest pain at nighttime waking him Subtle EKG changes, diffuse ST abnormality Discussed various treatment options with him including invasive versus noninvasive testing After further discussion, he prefers cardiac catheterization I have reviewed the risks, indications, and alternatives to cardiac catheterization, possible angioplasty, and stenting with the patient. Risks include but are not limited to bleeding, infection, vascular injury, stroke, myocardial infection, arrhythmia, kidney injury, radiation-related injury in the case of prolonged fluoroscopy use, emergency cardiac surgery, and death. The patient understands the risks of serious complication is 1-2 in 3403 with diagnostic cardiac cath and 1-2% or less with angioplasty/stenting.  -If possible he would like to be scheduled in the next 2 days as [employer] is expecting him to return to work shortly after that Tentatively scheduled for January 18 with Dr. Saunders Revel  Essential hypertension Blood pressure is well controlled on today's visit. No changes made to the medications.  controlled type 2 diabetes with neuropathy (HCC) A1c 6.6 Not exercising given recent chest pain symptoms Recommend moderating carbohydrate intake  Osteoarthritis Chronic knee pain, back pain Long hours of working at [employer]  Chronic midline low back pain, with sciatica presence unspecified Previously seen by chiropractor Stable   Total encounter time more than 25 minutes  Greater than 50%  was spent in counseling and coordination of care with the patient   No orders of the defined types were placed in this encounter.    Signed, Esmond Plants, M.D., Ph.D. 02/13/2021  [place], [place]

## 2021-02-13 ENCOUNTER — Other Ambulatory Visit (INDEPENDENT_AMBULATORY_CARE_PROVIDER_SITE_OTHER): Payer: Medicare Other

## 2021-02-13 ENCOUNTER — Ambulatory Visit (INDEPENDENT_AMBULATORY_CARE_PROVIDER_SITE_OTHER): Payer: Medicare Other | Admitting: Cardiovascular Disease

## 2021-02-13 ENCOUNTER — Other Ambulatory Visit: Payer: Self-pay

## 2021-02-13 ENCOUNTER — Encounter: Payer: Self-pay | Admitting: Cardiovascular Disease

## 2021-02-13 VITALS — BP 160/70 | HR 85 | Ht 71.0 in | Wt 240.0 lb

## 2021-02-13 DIAGNOSIS — E118 Type 2 diabetes mellitus with unspecified complications: Secondary | ICD-10-CM | POA: Diagnosis not present

## 2021-02-13 DIAGNOSIS — E785 Hyperlipidemia, unspecified: Secondary | ICD-10-CM

## 2021-02-13 DIAGNOSIS — E782 Mixed hyperlipidemia: Secondary | ICD-10-CM | POA: Diagnosis not present

## 2021-02-13 DIAGNOSIS — I25118 Atherosclerotic heart disease of native coronary artery with other forms of angina pectoris: Secondary | ICD-10-CM | POA: Diagnosis not present

## 2021-02-13 DIAGNOSIS — I700 Atherosclerosis of aorta: Secondary | ICD-10-CM

## 2021-02-13 DIAGNOSIS — E1169 Type 2 diabetes mellitus with other specified complication: Secondary | ICD-10-CM | POA: Diagnosis not present

## 2021-02-13 DIAGNOSIS — R972 Elevated prostate specific antigen [PSA]: Secondary | ICD-10-CM | POA: Diagnosis not present

## 2021-02-13 DIAGNOSIS — I10 Essential (primary) hypertension: Secondary | ICD-10-CM

## 2021-02-13 LAB — COMPREHENSIVE METABOLIC PANEL
ALT: 60 U/L — ABNORMAL HIGH (ref 0–53)
AST: 50 U/L — ABNORMAL HIGH (ref 0–37)
Albumin: 4.5 g/dL (ref 3.5–5.2)
Alkaline Phosphatase: 69 U/L (ref 39–117)
BUN: 15 mg/dL (ref 6–23)
CO2: 31 mEq/L (ref 19–32)
Calcium: 9.7 mg/dL (ref 8.4–10.5)
Chloride: 100 mEq/L (ref 96–112)
Creatinine, Ser: 0.78 mg/dL (ref 0.40–1.50)
GFR: 88.92 mL/min (ref >60.00)
Glucose, Bld: 124 mg/dL — ABNORMAL HIGH (ref 70–99)
Potassium: 4.6 mEq/L (ref 3.5–5.1)
Sodium: 139 mEq/L (ref 135–145)
Total Bilirubin: 0.8 mg/dL (ref 0.2–1.2)
Total Protein: 7.6 g/dL (ref 6.0–8.3)

## 2021-02-13 LAB — HEMOGLOBIN A1C: Hgb A1c MFr Bld: 6.6 % — ABNORMAL HIGH (ref 4.6–6.5)

## 2021-02-13 LAB — PSA: PSA: 3.9 ng/mL (ref 0.10–4.00)

## 2021-02-13 LAB — LDL CHOLESTEROL, DIRECT: Direct LDL: 54 mg/dL

## 2021-02-13 LAB — LIPID PANEL
Cholesterol: 117 mg/dL (ref 0–200)
HDL: 31.7 mg/dL — ABNORMAL LOW (ref >39.00)
NonHDL: 85.66
Total CHOL/HDL Ratio: 4
Triglycerides: 268 mg/dL — ABNORMAL HIGH (ref 0.0–149.0)
VLDL: 53.6 mg/dL — ABNORMAL HIGH (ref 0.0–40.0)

## 2021-02-13 NOTE — Patient Instructions (Addendum)
Medication Instructions:  No changes  If you need a refill on your cardiac medications before your next appointment, please call your pharmacy.   Lab work: No new labs needed  Testing/Procedures: Heart cath Wed Jan 18 at 1:30 PM with Dr. Saunders Revel  Follow-Up: At [facility], you and your health needs are our priority.  As part of our continuing mission to provide you with exceptional heart care, we have created designated Provider Care Teams.  These Care Teams include your primary Cardiologist (physician) and Advanced Practice Providers (APPs -  Physician Assistants and Nurse Practitioners) who all work together to provide you with the care you need, when you need it.  You will need a follow up appointment in 1 month  Providers on your designated Care Team:   Murray Hodgkins, NP Christell Faith, PA-C Cadence Kathlen Mody, [place]  COVID-19 Vaccine Information can be found at: [email or web address].uk For questions related to vaccine distribution or appointments, please email [email or web address] or call [phone].   [facility] Cardiac Cath Instructions  You are scheduled for a Cardiac Cath on: 02/15/2021   Please arrive at 12:30 pm, on the day of your procedure [facility]) Your start time is 1:30 pm Please expect a call from our [place] to pre-register you Do not eat/drink anything after midnight Someone will need to drive you home It is recommended someone be with you for the first 24 hours after your procedure Wear clothes that are easy to get on/off and wear slip on shoes if possible  Please bring a current list of all medications with you  _X__ You may take all of your medications the morning of your procedure with enough water to swallow safely  _X_ DO NOT take these medications before your procedure: Hold metformin 24 hrs prior to procedure and 48 hours after the procedure. May take metformin after the 48  hours.    For your safety and being able to monitor your condition, we request that they you do not wear nail polish, gel polish, artificial nails, or any other type of covering on natural nails including finger and toenails. If you have artificial nails, gel coating, etc.that requires to be removed by a nail saloon please have this removed prior to procedure. Your procedure may need to be canceled/ delayed if the performing provider/anesthesia team feels like the patient is unable to be adequately monitored.  Day of your procedure:  Arrive at the [facility] entrance.  Free valet service is available.  After entering the [place] please check-in at the registration desk (1st desk on your right) to receive your armband. After receiving your armband someone will escort you to the cardiac cath/special procedures waiting area.  The usual length of stay after your procedure is about 2 to 3 hours.  This can vary.  If you have any questions, please call our office at [phone], or you may call the cardiac cath lab at [facility] directly at [phone]  Your physician has requested that you have a cardiac catheterization. Cardiac catheterization is used to diagnose and/or treat various heart conditions. Doctors may recommend this procedure for a number of different reasons. The most common reason is to evaluate chest pain. Chest pain can be a symptom of coronary artery disease (CAD), and cardiac catheterization can show whether plaque is narrowing or blocking your hearts arteries. This procedure is also used to evaluate the valves, as well as measure the blood flow and oxygen levels in different parts of your heart. For  further information please visit HugeFiesta.[email or web address]. Please follow instruction sheet, as given.

## 2021-02-14 ENCOUNTER — Other Ambulatory Visit: Payer: Self-pay | Admitting: Cardiovascular Disease

## 2021-02-14 DIAGNOSIS — I209 Angina pectoris, unspecified: Secondary | ICD-10-CM

## 2021-02-15 ENCOUNTER — Encounter
Admission: RE | Disposition: A | Discharge status: Home / Self Care | Payer: Medicare Other | Source: Home / Self Care | Attending: Internal Medicine

## 2021-02-15 ENCOUNTER — Encounter: Payer: Self-pay | Admitting: Internal Medicine

## 2021-02-15 ENCOUNTER — Ambulatory Visit
Admission: RE | Admit: 2021-02-15 | Discharge: 2021-02-15 | Disposition: A | Discharge status: Home / Self Care | Payer: Medicare Other | Attending: Internal Medicine | Admitting: Internal Medicine

## 2021-02-15 DIAGNOSIS — E782 Mixed hyperlipidemia: Secondary | ICD-10-CM | POA: Diagnosis not present

## 2021-02-15 DIAGNOSIS — Z87891 Personal history of nicotine dependence: Secondary | ICD-10-CM | POA: Diagnosis not present

## 2021-02-15 DIAGNOSIS — M25569 Pain in unspecified knee: Secondary | ICD-10-CM | POA: Diagnosis not present

## 2021-02-15 DIAGNOSIS — I209 Angina pectoris, unspecified: Secondary | ICD-10-CM

## 2021-02-15 DIAGNOSIS — G8929 Other chronic pain: Secondary | ICD-10-CM | POA: Diagnosis not present

## 2021-02-15 DIAGNOSIS — G4733 Obstructive sleep apnea (adult) (pediatric): Secondary | ICD-10-CM | POA: Insufficient documentation

## 2021-02-15 DIAGNOSIS — I200 Unstable angina: Secondary | ICD-10-CM | POA: Diagnosis present

## 2021-02-15 DIAGNOSIS — I25110 Atherosclerotic heart disease of native coronary artery with unstable angina pectoris: Secondary | ICD-10-CM | POA: Diagnosis not present

## 2021-02-15 DIAGNOSIS — M1990 Unspecified osteoarthritis, unspecified site: Secondary | ICD-10-CM | POA: Diagnosis not present

## 2021-02-15 DIAGNOSIS — I10 Essential (primary) hypertension: Secondary | ICD-10-CM | POA: Diagnosis not present

## 2021-02-15 DIAGNOSIS — M549 Dorsalgia, unspecified: Secondary | ICD-10-CM | POA: Insufficient documentation

## 2021-02-15 DIAGNOSIS — R0789 Other chest pain: Secondary | ICD-10-CM | POA: Diagnosis present

## 2021-02-15 DIAGNOSIS — I25119 Atherosclerotic heart disease of native coronary artery with unspecified angina pectoris: Secondary | ICD-10-CM | POA: Diagnosis not present

## 2021-02-15 DIAGNOSIS — E1140 Type 2 diabetes mellitus with diabetic neuropathy, unspecified: Secondary | ICD-10-CM | POA: Diagnosis not present

## 2021-02-15 HISTORY — PX: LEFT HEART CATH AND CORONARY ANGIOGRAPHY: CATH118249

## 2021-02-15 LAB — GLUCOSE, CAPILLARY
Glucose-Capillary: 113 mg/dL — ABNORMAL HIGH (ref 70–99)
Glucose-Capillary: 83 mg/dL (ref 70–99)

## 2021-02-15 SURGERY — LEFT HEART CATH AND CORONARY ANGIOGRAPHY
Anesthesia: Moderate Sedation | Laterality: Left

## 2021-02-15 MED ORDER — MIDAZOLAM HCL 2 MG/2ML IJ SOLN
INTRAMUSCULAR | Status: AC
  Filled 2021-02-15: qty 2

## 2021-02-15 MED ORDER — ONDANSETRON HCL 4 MG/2ML IJ SOLN: 4.0000 mg | Freq: Four times a day (QID) | INTRAMUSCULAR | Status: DC | PRN

## 2021-02-15 MED ORDER — VERAPAMIL HCL 2.5 MG/ML IV SOLN
INTRAVENOUS | Status: AC
  Filled 2021-02-15: qty 2

## 2021-02-15 MED ORDER — LABETALOL HCL 5 MG/ML IV SOLN: 10.0000 mg | INTRAVENOUS | Status: DC | PRN

## 2021-02-15 MED ORDER — ASPIRIN 81 MG PO CHEW
81.0000 mg | CHEWABLE_TABLET | ORAL | Status: AC
  Administered 2021-02-15: 81 mg via ORAL

## 2021-02-15 MED ORDER — HEPARIN (PORCINE) IN NACL 2000-0.9 UNIT/L-% IV SOLN
INTRAVENOUS | Status: DC | PRN
  Administered 2021-02-15: 1000 mL

## 2021-02-15 MED ORDER — ISOSORBIDE MONONITRATE ER 30 MG PO TB24: 15.0000 mg | ORAL_TABLET | Freq: Every day | ORAL | 11 refills | Status: DC

## 2021-02-15 MED ORDER — VERAPAMIL HCL 2.5 MG/ML IV SOLN
INTRAVENOUS | Status: DC | PRN
  Administered 2021-02-15: 2.5 mg via INTRA_ARTERIAL

## 2021-02-15 MED ORDER — SODIUM CHLORIDE 0.9 % WEIGHT BASED INFUSION: 1.0000 mL/kg/h | INTRAVENOUS | Status: DC

## 2021-02-15 MED ORDER — FENTANYL CITRATE (PF) 100 MCG/2ML IJ SOLN
INTRAMUSCULAR | Status: AC
  Filled 2021-02-15: qty 2

## 2021-02-15 MED ORDER — SODIUM CHLORIDE 0.9 % IV SOLN: INTRAVENOUS | Status: DC

## 2021-02-15 MED ORDER — SODIUM CHLORIDE 0.9% FLUSH: 3.0000 mL | INTRAVENOUS | Status: DC | PRN

## 2021-02-15 MED ORDER — IOHEXOL 300 MG/ML  SOLN
INTRAMUSCULAR | Status: DC | PRN
  Administered 2021-02-15: 58 mL

## 2021-02-15 MED ORDER — SODIUM CHLORIDE 0.9 % IV SOLN: 250.0000 mL | INTRAVENOUS | Status: DC | PRN

## 2021-02-15 MED ORDER — SODIUM CHLORIDE 0.9 % WEIGHT BASED INFUSION
3.0000 mL/kg/h | INTRAVENOUS | Status: AC
  Administered 2021-02-15: 3 mL/kg/h via INTRAVENOUS

## 2021-02-15 MED ORDER — SODIUM CHLORIDE 0.9% FLUSH: 3.0000 mL | Freq: Two times a day (BID) | INTRAVENOUS | Status: DC

## 2021-02-15 MED ORDER — HEPARIN (PORCINE) IN NACL 1000-0.9 UT/500ML-% IV SOLN
INTRAVENOUS | Status: AC
  Filled 2021-02-15: qty 1000

## 2021-02-15 MED ORDER — ACETAMINOPHEN 325 MG PO TABS: 650.0000 mg | ORAL_TABLET | ORAL | Status: DC | PRN

## 2021-02-15 MED ORDER — MIDAZOLAM HCL 2 MG/2ML IJ SOLN
INTRAMUSCULAR | Status: DC | PRN
  Administered 2021-02-15: 1 mg via INTRAVENOUS

## 2021-02-15 MED ORDER — HEPARIN SODIUM (PORCINE) 1000 UNIT/ML IJ SOLN
INTRAMUSCULAR | Status: DC | PRN
  Administered 2021-02-15: 5000 [IU] via INTRAVENOUS

## 2021-02-15 MED ORDER — ASPIRIN 81 MG PO CHEW
CHEWABLE_TABLET | ORAL | Status: AC
  Filled 2021-02-15: qty 1

## 2021-02-15 MED ORDER — FENTANYL CITRATE (PF) 100 MCG/2ML IJ SOLN
INTRAMUSCULAR | Status: DC | PRN
  Administered 2021-02-15: 25 ug via INTRAVENOUS

## 2021-02-15 MED ORDER — HYDRALAZINE HCL 20 MG/ML IJ SOLN: 10.0000 mg | INTRAMUSCULAR | Status: DC | PRN

## 2021-02-15 MED ORDER — METFORMIN HCL 1000 MG PO TABS: 1000.0000 mg | ORAL_TABLET | Freq: Two times a day (BID) | ORAL | 1 refills | Status: DC

## 2021-02-15 MED ORDER — HEPARIN SODIUM (PORCINE) 1000 UNIT/ML IJ SOLN
INTRAMUSCULAR | Status: AC
  Filled 2021-02-15: qty 10

## 2021-02-15 SURGICAL SUPPLY — 11 items
CATH 5F 110X4 TIG (CATHETERS) ×1 IMPLANT
CATH INFINITI 5FR ANG PIGTAIL (CATHETERS) ×1 IMPLANT
DEVICE RAD TR BAND REGULAR (VASCULAR PRODUCTS) ×1 IMPLANT
DRAPE BRACHIAL (DRAPES) ×1 IMPLANT
GLIDESHEATH SLEND SS 6F .021 (SHEATH) ×1 IMPLANT
GUIDEWIRE INQWIRE 1.5J.035X260 (WIRE) IMPLANT
INQWIRE 1.5J .035X260CM (WIRE) ×2
PACK CARDIAC CATH (CUSTOM PROCEDURE TRAY) ×2 IMPLANT
PROTECTION STATION PRESSURIZED (MISCELLANEOUS) ×2
SET ATX SIMPLICITY (MISCELLANEOUS) ×1 IMPLANT
STATION PROTECTION PRESSURIZED (MISCELLANEOUS) IMPLANT

## 2021-02-15 NOTE — Interval H&P Note (Signed)
History and Physical Interval Note:  02/15/2021 1:00 PM  Casey Golden  has presented today for surgery, with the diagnosis of accelerating angina.  The various methods of treatment have been discussed with the patient and family. After consideration of risks, benefits and other options for treatment, the patient has consented to  Procedure(s): LEFT HEART CATH AND CORONARY ANGIOGRAPHY (Left) as a surgical intervention.  The patient's history has been reviewed, patient examined, no change in status, stable for surgery.  I have reviewed the patient's chart and labs.  Questions were answered to the patient's satisfaction.    Cath Lab Visit (complete for each Cath Lab visit)  Clinical Evaluation Leading to the Procedure:   ACS: No.  Non-ACS:    Anginal Classification: CCS IV  Anti-ischemic medical therapy: No Therapy  Non-Invasive Test Results: No non-invasive testing performed  Prior CABG: No previous CABG   Leonardo Plaia

## 2021-02-16 ENCOUNTER — Encounter: Payer: Self-pay | Admitting: Internal Medicine

## 2021-02-17 ENCOUNTER — Encounter: Payer: Self-pay | Admitting: *Deleted

## 2021-02-17 ENCOUNTER — Ambulatory Visit (INDEPENDENT_AMBULATORY_CARE_PROVIDER_SITE_OTHER): Payer: Medicare Other | Admitting: Family Medicine

## 2021-02-17 ENCOUNTER — Encounter: Payer: Self-pay | Admitting: Family Medicine

## 2021-02-17 ENCOUNTER — Other Ambulatory Visit: Payer: Self-pay

## 2021-02-17 VITALS — BP 152/68 | HR 86 | Temp 97.8°F | Ht 71.0 in | Wt 244.0 lb

## 2021-02-17 DIAGNOSIS — R911 Solitary pulmonary nodule: Secondary | ICD-10-CM | POA: Diagnosis not present

## 2021-02-17 DIAGNOSIS — E785 Hyperlipidemia, unspecified: Secondary | ICD-10-CM | POA: Diagnosis not present

## 2021-02-17 DIAGNOSIS — I25118 Atherosclerotic heart disease of native coronary artery with other forms of angina pectoris: Secondary | ICD-10-CM

## 2021-02-17 DIAGNOSIS — I10 Essential (primary) hypertension: Secondary | ICD-10-CM

## 2021-02-17 DIAGNOSIS — E1169 Type 2 diabetes mellitus with other specified complication: Secondary | ICD-10-CM | POA: Diagnosis not present

## 2021-02-17 DIAGNOSIS — E118 Type 2 diabetes mellitus with unspecified complications: Secondary | ICD-10-CM

## 2021-02-17 NOTE — Assessment & Plan Note (Signed)
Appreciate cardiology care. Continue aspirin, statin, imdur started 01/2021

## 2021-02-17 NOTE — Patient Instructions (Addendum)
Restart all medicines including new isosorbide.  Keep an eye on blood pressures at home, let me know if consistently >140/90.  Let us or cardiology know if any worsening chest pain.  Keep appointment with Dr Rockey Situ 03/2021.  Return after 07/13/2021 for physical.

## 2021-02-17 NOTE — Assessment & Plan Note (Signed)
BP significantly elevated today - he mistakenly stopped all meds after catheterization - will take losartan 50mg  when he gets home today, as well as new isosorbide mononitrate and all other medications. I asked him to update [place] if BP readings stay >140/90 for further titration.

## 2021-02-17 NOTE — Assessment & Plan Note (Signed)
Small pulm nodule - consider f/u chest CT in ex smoker (ordered 06/2020, not completed).  Quit smoking remotely - does not qualify for lung cancer screening CT program.

## 2021-02-17 NOTE — Assessment & Plan Note (Signed)
A1c remains in controlled range.

## 2021-02-17 NOTE — Assessment & Plan Note (Signed)
Back on atorvastatin.

## 2021-02-17 NOTE — Assessment & Plan Note (Deleted)
PSA levels recently dropping.

## 2021-02-17 NOTE — Progress Notes (Signed)
Patient ID: Casey Golden, male    DOB: Dec 03, 1950, 71 y.o.   MRN: [id number]  This visit was conducted in person.  BP (!) 152/68 (BP Location: Right Arm, Cuff Size: Large)    Pulse 86    Temp 97.8 F (36.6 C) (Temporal)    Ht _0  (1.803 m)    Wt 244 lb (110.7 kg)    SpO2 95%    BMI 34.03 kg/m   BP Readings from Last 3 Encounters:  02/17/21 (!) 152/68  02/15/21 133/61  02/13/21 (!) 160/70  160/60s on retesting  CC: follow up visit Subjective:   HPI: Casey Golden is a 70 y.o. male presenting on 02/17/2021 for Follow-up   See prior note for details. Presented earlier this month with symptoms concerning for angina - referred urgently to cardiology. Cardiac catheterization reassuringly with mild-moderate non-obstructive CAD including long segment of mid LAD stenosis up to 30-40% and hyperdynamic LV systolic function with mildly elevated filling pressures. Recommend start isosorbide mononitrate 58m daily, continue aspirin 847mdaily and other medical therapy/risk factor modification.  Hasn't started isosorbide yet - planning to start today.  Hasn't had chest CT done yet.   Misunderstanding -was supposed to hold metformin for 48 hours after catheterization - he held all his medications including losartan, aspirin and statin.      Relevant past medical, surgical, family and social history reviewed and updated as indicated. Interim medical history since our last visit reviewed. Allergies and medications reviewed and updated. Outpatient Medications Prior to Visit  Medication Sig Dispense Refill   aspirin 81 MG EC tablet Take 1 tablet (81 mg total) by mouth daily. (Patient taking differently: Take 81 mg by mouth every evening.) 30 tablet 12   atorvastatin (LIPITOR) 40 MG tablet TAKE 1 TABLET BY MOUTH  DAILY (Patient taking differently: Take 40 mg by mouth every evening.) 90 tablet 1   Blood Glucose Monitoring Suppl (BLOOD GLUCOSE MONITOR SYSTEM) w/Device KIT 1 each by Does not  apply route daily. Use as directed to check blood sugar once daily 1 each 0   diclofenac sodium (VOLTAREN) 1 % GEL APPLY 2 G TOPICALLY 3 (THREE) TIMES DAILY AS NEEDED (ANTI INFLAMMATORY). 100 g 1   gabapentin (NEURONTIN) 300 MG capsule TAKE 1 CAPSULE BY MOUTH 3  TIMES DAILY 270 capsule 3   Glucose Blood (BLOOD GLUCOSE TEST STRIPS) STRP 1 each by In Vitro route daily. Use as directed to check blood sugar once daily 100 each 0   ibuprofen (ADVIL,MOTRIN) 200 MG tablet Take 200-600 mg by mouth every 8 (eight) hours as needed for mild pain (severe back pain.).     isosorbide mononitrate (IMDUR) 30 MG 24 hr tablet Take 0.5 tablets (15 mg total) by mouth daily. 30 tablet 11   Lancets MISC 1 each by Does not apply route daily. Use as directed to check blood sugar daily 100 each 0   losartan (COZAAR) 50 MG tablet Take 1 tablet (50 mg total) by mouth daily. 30 tablet 11   [START ON 02/18/2021] metFORMIN (GLUCOPHAGE) 1000 MG tablet Take 1 tablet (1,000 mg total) by mouth 2 (two) times daily with a meal. 180 tablet 1   nitroGLYCERIN (NITROSTAT) 0.4 MG SL tablet Place 1 tablet (0.4 mg total) under the tongue every 5 (five) minutes as needed for chest pain (do not take more than 3 doses.). 50 tablet 1   No facility-administered medications prior to visit.     Per HPI unless specifically indicated  in ROS section below Review of Systems  Objective:  BP (!) 152/68 (BP Location: Right Arm, Cuff Size: Large)    Pulse 86    Temp 97.8 F (36.6 C) (Temporal)    Ht _0  (1.803 m)    Wt 244 lb (110.7 kg)    SpO2 95%    BMI 34.03 kg/m   Wt Readings from Last 3 Encounters:  02/17/21 244 lb (110.7 kg)  02/15/21 240 lb (108.9 kg)  02/13/21 240 lb (108.9 kg)      Physical Exam Vitals and nursing note reviewed.  Constitutional:      Appearance: Normal appearance. He is obese. He is not ill-appearing.  Cardiovascular:     Rate and Rhythm: Normal rate and regular rhythm.     Pulses: Normal pulses.     Heart  sounds: Normal heart sounds. No murmur heard. Pulmonary:     Effort: Pulmonary effort is normal. No respiratory distress.     Breath sounds: Normal breath sounds. No wheezing, rhonchi or rales.  Musculoskeletal:     Right lower leg: No edema.     Left lower leg: No edema.  Skin:    General: Skin is warm and dry.  Neurological:     Mental Status: He is alert.  Psychiatric:        Mood and Affect: Mood normal.        Behavior: Behavior normal.      Results for orders placed or performed during the hospital encounter of 02/15/21  Glucose, capillary  Result Value Ref Range   Glucose-Capillary 113 (H) 70 - 99 mg/dL  Glucose, capillary  Result Value Ref Range   Glucose-Capillary 83 70 - 99 mg/dL    Assessment & Plan:  This visit occurred during the SARS-CoV-2 public health emergency.  Safety protocols were in place, including screening questions prior to the visit, additional usage of staff PPE, and extensive cleaning of exam room while observing appropriate contact time as indicated for disinfecting solutions.   Problem List Items Addressed This Visit     HTN (hypertension)    BP significantly elevated today - he mistakenly stopped all meds after catheterization - will take losartan 61m when he gets home today, as well as new isosorbide mononitrate and all other medications. I asked him to update [place]if BP readings stay >140/90 for further titration.       CAD (coronary artery disease), native coronary artery - Primary    Appreciate cardiology care. Continue aspirin, statin, imdur started 01/2021      Controlled diabetes mellitus type 2 with complications (HCC)    AP2Rremains in controlled range.       Dyslipidemia associated with type 2 diabetes mellitus (HCC)    Back on atorvastatin.       Pulmonary nodule less than 6 mm determined by computed tomography of lung    Small pulm nodule - consider f/u chest CT in ex smoker (ordered 06/2020, not completed).  Quit smoking  remotely - does not qualify for lung cancer screening CT program.       Relevant Orders   CT Chest Wo Contrast     No orders of the defined types were placed in this encounter.  Orders Placed This Encounter  Procedures   CT Chest Wo Contrast    Standing Status:   Future    Standing Expiration Date:   02/17/2022    Order Specific Question:   Preferred imaging location?    Answer:  [facility]     Patient Instructions  Restart all medicines including new isosorbide.  Keep an eye on blood pressures at home, let me know if consistently >140/90.  Let us or cardiology know if any worsening chest pain.  Keep appointment with Dr Rockey Situ 03/2021.  Return after 07/13/2021 for physical.   Follow up plan: Return in about 5 months (around 07/18/2021) for annual exam, prior fasting for blood work, medicare wellness visit.  Ria Bush, MD

## 2021-02-20 ENCOUNTER — Telehealth: Payer: Self-pay | Admitting: *Deleted

## 2021-02-20 NOTE — Telephone Encounter (Signed)
PLEASE NOTE: All timestamps contained within this report are represented as [place] Standard Time. CONFIDENTIALTY NOTICE: This fax transmission is intended only for the addressee. It contains information that is legally privileged, confidential or otherwise protected from use or disclosure. If you are not the intended recipient, you are strictly prohibited from reviewing, disclosing, copying using or disseminating any of this information or taking any action in reliance on or regarding this information. If you have received this fax in error, please notify us immediately by telephone so that we can arrange for its return to [place]. Phone: [phone], Toll-Free: [phone], Fax: [phone] Page: 1 of 1 Call Id: [id number] [place] Night - Client Nonclinical Telephone Record  [facility] Client [place] Night - Client Client Site [place] Provider AA - PHYSICIAN, Verita Schneiders- MD Contact Type Call Who Is Calling Patient / Member / Family / Caregiver Caller Name Camryn Quesinberry Caller Phone Number [phone] Patient Name Casey Golden Patient DOB 1950/07/07 Call Type Message Only Information Provided Reason for Call Request for General Office Information Initial Comment Caller states he might have left his coat there in the office. Additional Comment Provided office hours. Disp. Time Disposition Final User 02/17/2021 5:22:40 PM General Information Provided Yes Atha Starks Michelene Gardener Call Closed By: Baker Janus Transaction Date/Time: 02/17/2021 5:19:43 PM (ET)

## 2021-02-20 NOTE — Telephone Encounter (Signed)
Spoke with pt letting him know there was no coat left at our office the day of his visit.  States he found it at home and offers his apologies.

## 2021-02-21 ENCOUNTER — Encounter: Payer: Self-pay | Admitting: *Deleted

## 2021-02-22 DIAGNOSIS — L814 Other melanin hyperpigmentation: Secondary | ICD-10-CM | POA: Diagnosis not present

## 2021-02-22 DIAGNOSIS — D229 Melanocytic nevi, unspecified: Secondary | ICD-10-CM | POA: Diagnosis not present

## 2021-02-22 DIAGNOSIS — Z85828 Personal history of other malignant neoplasm of skin: Secondary | ICD-10-CM | POA: Diagnosis not present

## 2021-02-22 DIAGNOSIS — L821 Other seborrheic keratosis: Secondary | ICD-10-CM | POA: Diagnosis not present

## 2021-02-22 DIAGNOSIS — L570 Actinic keratosis: Secondary | ICD-10-CM | POA: Diagnosis not present

## 2021-02-22 DIAGNOSIS — C4499 Other specified malignant neoplasm of skin, unspecified: Secondary | ICD-10-CM | POA: Diagnosis not present

## 2021-02-23 ENCOUNTER — Telehealth: Payer: Medicare Other

## 2021-02-23 NOTE — Telephone Encounter (Signed)
[employer] Medicare does not require precert for CTs [employer] was approved, not sure why they are calling and telling him otherwise but his scan was approved on 02/20/21  Status: Approved Authorization Number: [id number] Case Number: [id number]    Patient Name: Casey Golden, Casey Golden DOB: 1950-10-16 Approval Date: 02/20/2021 4:21:13 PM Service Code: [id number] Service Description: CT THORAX W/O CONTRAST Site Name: [facility] - Start Date: 02/20/2021 Expiration Date: 08/19/2021 Date Last Updated: 02/20/2021 4:22:12 PM

## 2021-02-23 NOTE — Telephone Encounter (Signed)
[place] Night - Client Nonclinical Telephone Record  [facility] Client [place] Night - Client Client Site [place] Provider Ria Bush - MD Contact Type Call Who Is Calling Patient / Member / Family / Caregiver Caller Name [place] Phone Number [phone] Patient Name Casey Golden Patient DOB 11/20/1950 Call Type Message Only Information Provided Reason for Call Request for General Office Information Initial Comment Caller states she is calling for her husband. He got a message that the CT scan was preapproved but yesterday his insurance left a voicemail saying that it was not approved. They are wanting to know what is going on Additional Comment Office hours provided. Caller wanted to send a message. Disp. Time Disposition Final User 02/22/2021 5:30:35 PM General Information Provided Yes Susette Racer Call Closed By: Susette Racer Transaction Date/Time: 02/22/2021 5:27:26 PM (ET

## 2021-02-24 NOTE — Telephone Encounter (Signed)
I ran everything through [facility] Medicare to double check if Casey Golden is required or not.   Prior Authorization is not required for the requested services per [facility] Decision ID #:[id number]  Pt is aware and he is calling to schedule.   Nothing further needed.

## 2021-03-03 ENCOUNTER — Other Ambulatory Visit: Payer: Medicare Other

## 2021-03-08 ENCOUNTER — Ambulatory Visit
Admission: RE | Admit: 2021-03-08 | Discharge: 2021-03-08 | Disposition: A | Discharge status: Home / Self Care | Payer: Medicare Other | Source: Ambulatory Visit | Attending: Family Medicine | Admitting: Family Medicine

## 2021-03-08 DIAGNOSIS — R911 Solitary pulmonary nodule: Secondary | ICD-10-CM | POA: Insufficient documentation

## 2021-03-08 DIAGNOSIS — J439 Emphysema, unspecified: Secondary | ICD-10-CM | POA: Diagnosis not present

## 2021-03-12 ENCOUNTER — Encounter: Payer: Self-pay | Admitting: Family Medicine

## 2021-03-18 NOTE — Progress Notes (Signed)
Cardiology Office Note  Date:  03/20/2021   ID:  Casey Golden, Casey Golden 03/11/1950, MRN [id number]  PCP:  Ria Bush, MD   Chief Complaint  Patient presents with   1 month follow up     Patient c/o chest pain at times. Medications reviewed by the patient verbally.     HPI:  Casey Golden is a very pleasant 71 year old gentleman with past medical history of  Former smoker, quit 1990,  smoking for 27 years,   Coronary artery disease  50% mid RCA disease by cardiac catheterization in 2009 , 30% LAD disease  mixed hyperlipidemia,  hypertension.  syncopal episode many years ago while walking his dog. He was down for one minute. He had no warning. He has not had any further episodes since that time Extramammary Paget's disease of scrotum Who presents for follow-up of his coronary artery disease  Last seen in clinic February 13, 2021, was having anginal symptoms Catheterization performed February 15, 2021,   2nd Diag lesion is 20% stenosed.   Mid LAD to Dist LAD lesion is 35% stenosed.   Prox RCA lesion is 25% stenosed.   There is hyperdynamic left ventricular systolic function.   LV end diastolic pressure is mildly elevated.   The left ventricular ejection fraction is greater than 65% by visual estimate.   There is no aortic valve stenosis.   Mild-moderate, non-obstructive coronary artery disease, including long segment of mid LAD stenosis of up to 30-40%, 20% ostial D2 stenosis, and 20-30% proximal RCA lesion. Hyperdynamic left ventricular systolic function with mildly elevated filling pressure (LVEDP ~20 mmHg).   Following the catheterization he was prescribed Imdur 15 daily Reports that he tried the medication for several days but had to stop Side effects on imdur, "sick to stomach", could not get up, H/A  Quit smoking before 2000  Denies any significant chest pain, sometimes has some brief chest discomfort that is mild Back at work, works part-time at [employer] take care  of grandchildren Regular exercise program  Labs reviewed A1C 6.6 Total chol 95, LDL 42  EKG personally reviewed by myself on todays visit NSR rate rate 76 bpm, diffuse nonspcific ST abn , possibly more pronounced than prior EKGs  Other past medical history reviewed Seen by dermatology Extramammary Paget's disease of scrotum , surgery at [facility], MOHS Post op hematoma next day CT scan: 5.6 x 8.4 x 14.2 cm curvilinear hematoma extending from the left inguinal region into the scrotal sac.   CT scan: degenerative changes throughout the spine associated with a thoracolumbar scoliosis. There is osseous foraminal narrowing in the lower lumbar spine, especially on the right at L4-5 and to the left at L5-S1.  Cardiac catheterization report 05/13/2006 detailing normal ejection fraction, 50% mid RCA disease    testicular surgery 09/14/2014 in [place] with Dr. Junious Silk.    positive Myoview several years ago which resulted in a heart catheterization.  He had nonobstructive disease. His normal left ventricular function.    PMH:   has a past medical history of Arthritis, CAD (coronary artery disease), Cancer ([place]), Cataract (2019), Colonic polyp, Fatty liver disease, nonalcoholic (4196), GERD (gastroesophageal reflux disease), History of chicken pox, Hyperlipidemia, Hypertension, Nocturia, OSA (obstructive sleep apnea), Paget's disease of bony pelvis, Past use of tobacco, Rash of genital area, Seasonal and perennial allergic rhinitis, Type 2 diabetes mellitus ([place]), Wears dentures, and Wears glasses.  PSH:    Past Surgical History:  Procedure Laterality Date   ABDOMINAL HERNIA REPAIR  2007      [facility]   open repair   APPENDECTOMY  age 65   CARDIAC CATHETERIZATION  12-23-2007   [facility]   Abnormal myoview w/ ischemia/  40% mRCA with nonobstructive and no sig. plaque in his left system, EF 55%   CARDIAC CATHETERIZATION  Apr 2008    [facility]   Abnormal myoview/  50% RCA,  ef 65%   CARDIAC CATHETERIZATION   1999      [facility]   CATARACT EXTRACTION, BILATERAL Bilateral 11/2017   CERVICAL FUSION  1992   CHOLECYSTECTOMY OPEN  2006   COLONOSCOPY WITH PROPOFOL N/A 04/17/2016   TAs, high grade dysplasia with margins clear, diverticulosis Casey Lame, MD)   COLONOSCOPY WITH PROPOFOL N/A 08/07/2016   TAx1, diverticulosis, rpt 3 yrs ([place])   COLONOSCOPY WITH PROPOFOL N/A 01/05/2020   SSP, rpt Allen Norris, Darren, MD)   DENTAL SURGERY     metal dental implant L mandible   EXCISION OF SKIN TAG Right 09/14/2014   Procedure: EXCISION OF SKIN TAG;  Surgeon: Festus Aloe, MD;  Location: [facility];  Service: Urology;  Laterality: Right;   GROIN MASS OPEN BIOPSY Left 01/17/2019   HYDROCELE EXCISION Left 09/14/2014   Procedure: LEFT HYDROCELECTOMY ADULT;  Surgeon: Festus Aloe, MD;  [facility];  Service: Urology;  Laterality: Left;   LEFT HEART CATH AND CORONARY ANGIOGRAPHY Left 02/15/2021   Procedure: LEFT HEART CATH AND CORONARY ANGIOGRAPHY;  Surgeon: Nelva Bush, MD;  Location: [facility];  Service: Cardiovascular;  Laterality: Left;   MOHS SURGERY  2015   skin cancer   TONSILLECTOMY  age 64    Current Outpatient Medications  Medication Sig Dispense Refill   aspirin 81 MG EC tablet Take 1 tablet (81 mg total) by mouth daily. (Patient taking differently: Take 81 mg by mouth every evening.) 30 tablet 12   atorvastatin (LIPITOR) 40 MG tablet TAKE 1 TABLET BY MOUTH  DAILY (Patient taking differently: Take 40 mg by mouth every evening.) 90 tablet 1   Blood Glucose Monitoring Suppl (BLOOD GLUCOSE MONITOR SYSTEM) w/Device KIT 1 each by Does not apply route daily. Use as directed to check blood sugar once daily 1 each 0   diclofenac sodium (VOLTAREN) 1 % GEL APPLY 2 G TOPICALLY 3 (THREE) TIMES DAILY AS NEEDED (ANTI INFLAMMATORY). 100 g 1   gabapentin (NEURONTIN) 300 MG capsule TAKE 1 CAPSULE BY MOUTH 3  TIMES DAILY 270 capsule 3   Glucose Blood (BLOOD  GLUCOSE TEST STRIPS) STRP 1 each by In Vitro route daily. Use as directed to check blood sugar once daily 100 each 0   ibuprofen (ADVIL,MOTRIN) 200 MG tablet Take 200-600 mg by mouth every 8 (eight) hours as needed for mild pain (severe back pain.).     Lancets MISC 1 each by Does not apply route daily. Use as directed to check blood sugar daily 100 each 0   losartan (COZAAR) 50 MG tablet Take 1 tablet (50 mg total) by mouth daily. 30 tablet 11   metFORMIN (GLUCOPHAGE) 1000 MG tablet Take 1 tablet (1,000 mg total) by mouth 2 (two) times daily with a meal. 180 tablet 1   nitroGLYCERIN (NITROSTAT) 0.4 MG SL tablet Place 1 tablet (0.4 mg total) under the tongue every 5 (five) minutes as needed for chest pain (do not take more than 3 doses.). 50 tablet 1   No current facility-administered medications for this visit.    Allergies:   Elemental sulfur and  Sulfa antibiotics   Social History:  The patient  reports that he quit smoking about 31 years ago. His smoking use included cigarettes. He has a 70.00 pack-year smoking history. He has never used smokeless tobacco. He reports that he does not drink alcohol and does not use drugs.   Family History:   family history includes CAD in his mother; Cancer in his sister; Cancer (age of onset: 108) in his father; Diabetes in his mother and paternal grandfather; Heart disease in his paternal grandfather and paternal grandmother; Stroke in his mother.    Review of Systems: Review of Systems  Constitutional: Negative.   HENT: Negative.    Respiratory: Negative.    Cardiovascular: Negative.   Gastrointestinal: Negative.   Musculoskeletal:  Positive for back pain and joint pain.  Neurological: Negative.   Psychiatric/Behavioral: Negative.    All other systems reviewed and are negative.  PHYSICAL EXAM: VS:  BP 120/68 (BP Location: Left Arm, Patient Position: Sitting, Cuff Size: Normal)    Pulse 76    Ht _0  (1.803 m)    Wt 242 lb (109.8 kg)    SpO2 98%     BMI 33.75 kg/m  , BMI Body mass index is 33.75 kg/m.   Recent Labs: 02/01/2021: Hemoglobin 15.8; Platelets 259 02/13/2021: ALT 60; BUN 15; Creatinine, Ser 0.78; Potassium 4.6; Sodium 139    Lipid Panel Lab Results  Component Value Date   CHOL 117 02/13/2021   HDL 31.70 (L) 02/13/2021   LDLCALC 43 03/21/2017   TRIG 268.0 (H) 02/13/2021      Wt Readings from Last 3 Encounters:  03/20/21 242 lb (109.8 kg)  02/17/21 244 lb (110.7 kg)  02/15/21 240 lb (108.9 kg)      ASSESSMENT AND PLAN:  Coronary artery disease involving native coronary artery of native heart without angina pectoris -  Recent cardiac cath, nonobstructive disease Did not tolerate imdur after cath Just wishes to take NTG sl Non on b-blocker, BP low  Essential hypertension Blood pressure is well controlled on today's visit. No changes made to the medications.  controlled type 2 diabetes with neuropathy (HCC) A1c 6.6 We have encouraged continued exercise, careful diet management in an effort to lose weight.  Osteoarthritis Chronic knee pain, back pain Long hours of working at [employer]  Chronic midline low back pain, with sciatica presence unspecified Previously seen by chiropractor [employer] part time [employer]   Total encounter time more than 30 minutes  Greater than 50% was spent in counseling and coordination of care with the patient   No orders of the defined types were placed in this encounter.    Signed, Esmond Plants, M.D., Ph.D. 03/20/2021  [place], [place]

## 2021-03-20 ENCOUNTER — Encounter: Payer: Self-pay | Admitting: Cardiovascular Disease

## 2021-03-20 ENCOUNTER — Other Ambulatory Visit: Payer: Self-pay

## 2021-03-20 ENCOUNTER — Ambulatory Visit (INDEPENDENT_AMBULATORY_CARE_PROVIDER_SITE_OTHER): Payer: Medicare Other | Admitting: Cardiovascular Disease

## 2021-03-20 VITALS — BP 120/68 | HR 76 | Ht 71.0 in | Wt 242.0 lb

## 2021-03-20 DIAGNOSIS — I10 Essential (primary) hypertension: Secondary | ICD-10-CM | POA: Diagnosis not present

## 2021-03-20 DIAGNOSIS — E782 Mixed hyperlipidemia: Secondary | ICD-10-CM

## 2021-03-20 DIAGNOSIS — I700 Atherosclerosis of aorta: Secondary | ICD-10-CM | POA: Diagnosis not present

## 2021-03-20 DIAGNOSIS — I25118 Atherosclerotic heart disease of native coronary artery with other forms of angina pectoris: Secondary | ICD-10-CM | POA: Diagnosis not present

## 2021-03-20 NOTE — Patient Instructions (Signed)
Medication Instructions:  No changes  If you need a refill on your cardiac medications before your next appointment, please call your pharmacy.   Lab work: No new labs needed  Testing/Procedures: No new testing needed  Follow-Up: At [facility], you and your health needs are our priority.  As part of our continuing mission to provide you with exceptional heart care, we have created designated Provider Care Teams.  These Care Teams include your primary Cardiologist (physician) and Advanced Practice Providers (APPs -  Physician Assistants and Nurse Practitioners) who all work together to provide you with the care you need, when you need it.  You will need a follow up appointment in 12 months  Providers on your designated Care Team:   Christopher Berge, NP Ryan Dunn, PA-C Cadence Furth, PA-C  COVID-19 Vaccine Information can be found at: [email or web address] For questions related to vaccine distribution or appointments, please email [email or web address] or call [phone].   

## 2021-03-23 NOTE — Addendum Note (Signed)
Addended by: Anselm Pancoast on: 03/23/2021 08:40 AM   Modules accepted: Orders

## 2021-04-17 ENCOUNTER — Emergency Department
Admission: EM | Admit: 2021-04-17 | Discharge: 2021-04-17 | Disposition: A | Discharge status: Home / Self Care | Payer: Medicare Other | Attending: Emergency Medicine | Admitting: Emergency Medicine

## 2021-04-17 ENCOUNTER — Emergency Department: Payer: Medicare Other

## 2021-04-17 ENCOUNTER — Other Ambulatory Visit: Payer: Self-pay

## 2021-04-17 DIAGNOSIS — J449 Chronic obstructive pulmonary disease, unspecified: Secondary | ICD-10-CM | POA: Insufficient documentation

## 2021-04-17 DIAGNOSIS — Z041 Encounter for examination and observation following transport accident: Secondary | ICD-10-CM | POA: Diagnosis not present

## 2021-04-17 DIAGNOSIS — Y92410 Unspecified street and highway as the place of occurrence of the external cause: Secondary | ICD-10-CM | POA: Insufficient documentation

## 2021-04-17 DIAGNOSIS — S199XXA Unspecified injury of neck, initial encounter: Secondary | ICD-10-CM | POA: Diagnosis not present

## 2021-04-17 DIAGNOSIS — I2510 Atherosclerotic heart disease of native coronary artery without angina pectoris: Secondary | ICD-10-CM | POA: Diagnosis not present

## 2021-04-17 DIAGNOSIS — Z743 Need for continuous supervision: Secondary | ICD-10-CM | POA: Diagnosis not present

## 2021-04-17 DIAGNOSIS — R6889 Other general symptoms and signs: Secondary | ICD-10-CM | POA: Diagnosis not present

## 2021-04-17 DIAGNOSIS — S50312A Abrasion of left elbow, initial encounter: Secondary | ICD-10-CM | POA: Insufficient documentation

## 2021-04-17 DIAGNOSIS — S0990XA Unspecified injury of head, initial encounter: Secondary | ICD-10-CM | POA: Diagnosis not present

## 2021-04-17 DIAGNOSIS — E119 Type 2 diabetes mellitus without complications: Secondary | ICD-10-CM | POA: Diagnosis not present

## 2021-04-17 DIAGNOSIS — R519 Headache, unspecified: Secondary | ICD-10-CM | POA: Diagnosis not present

## 2021-04-17 DIAGNOSIS — R0902 Hypoxemia: Secondary | ICD-10-CM | POA: Diagnosis not present

## 2021-04-17 DIAGNOSIS — S59902A Unspecified injury of left elbow, initial encounter: Secondary | ICD-10-CM | POA: Diagnosis present

## 2021-04-17 DIAGNOSIS — I10 Essential (primary) hypertension: Secondary | ICD-10-CM | POA: Diagnosis not present

## 2021-04-17 DIAGNOSIS — M79603 Pain in arm, unspecified: Secondary | ICD-10-CM | POA: Diagnosis not present

## 2021-04-17 DIAGNOSIS — M542 Cervicalgia: Secondary | ICD-10-CM | POA: Diagnosis not present

## 2021-04-17 DIAGNOSIS — R609 Edema, unspecified: Secondary | ICD-10-CM | POA: Diagnosis not present

## 2021-04-17 NOTE — ED Notes (Signed)
Pt taken for CT 
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
## 2021-05-10 ENCOUNTER — Other Ambulatory Visit: Payer: Self-pay | Admitting: Family Medicine

## 2021-05-11 MED ORDER — LOSARTAN POTASSIUM 50 MG PO TABS: 50.0000 mg | ORAL_TABLET | Freq: Every day | ORAL | 0 refills | Status: DC

## 2021-05-11 NOTE — Telephone Encounter (Signed)
Gabapentin ?Last filled:  03/15/21, #270 ?Last OV:  02/17/21, HTN f/u ?Next OV:  none ? ?Spoke with pt to schedule cpe and fasting lab visits.  States he'll have to call back after getting work schedule for that time.  ?

## 2021-05-11 NOTE — Telephone Encounter (Signed)
ERx 

## 2021-06-14 ENCOUNTER — Encounter: Payer: 59 | Admitting: Family Medicine

## 2021-07-07 ENCOUNTER — Ambulatory Visit: Payer: 59

## 2021-07-10 ENCOUNTER — Ambulatory Visit: Payer: 59

## 2021-07-13 ENCOUNTER — Other Ambulatory Visit: Payer: Self-pay | Admitting: Family Medicine

## 2021-07-13 DIAGNOSIS — R972 Elevated prostate specific antigen [PSA]: Secondary | ICD-10-CM

## 2021-07-13 DIAGNOSIS — E1169 Type 2 diabetes mellitus with other specified complication: Secondary | ICD-10-CM

## 2021-07-13 DIAGNOSIS — E118 Type 2 diabetes mellitus with unspecified complications: Secondary | ICD-10-CM

## 2021-07-14 ENCOUNTER — Other Ambulatory Visit (INDEPENDENT_AMBULATORY_CARE_PROVIDER_SITE_OTHER): Payer: 59

## 2021-07-14 DIAGNOSIS — E118 Type 2 diabetes mellitus with unspecified complications: Secondary | ICD-10-CM | POA: Diagnosis not present

## 2021-07-14 DIAGNOSIS — E1169 Type 2 diabetes mellitus with other specified complication: Secondary | ICD-10-CM | POA: Diagnosis not present

## 2021-07-14 DIAGNOSIS — E785 Hyperlipidemia, unspecified: Secondary | ICD-10-CM

## 2021-07-14 LAB — LIPID PANEL
Cholesterol: 83 mg/dL (ref 0–200)
HDL: 29.3 mg/dL — ABNORMAL LOW (ref >39.00)
LDL Cholesterol: 27 mg/dL (ref 0–99)
NonHDL: 53.42
Total CHOL/HDL Ratio: 3
Triglycerides: 132 mg/dL (ref 0.0–149.0)
VLDL: 26.4 mg/dL (ref 0.0–40.0)

## 2021-07-14 LAB — COMPREHENSIVE METABOLIC PANEL
ALT: 39 U/L (ref 0–53)
AST: 35 U/L (ref 0–37)
Albumin: 4.2 g/dL (ref 3.5–5.2)
Alkaline Phosphatase: 55 U/L (ref 39–117)
BUN: 12 mg/dL (ref 6–23)
CO2: 31 mEq/L (ref 19–32)
Calcium: 9.3 mg/dL (ref 8.4–10.5)
Chloride: 102 mEq/L (ref 96–112)
Creatinine, Ser: 0.94 mg/dL (ref 0.40–1.50)
GFR: 80.59 mL/min (ref >60.00)
Glucose, Bld: 114 mg/dL — ABNORMAL HIGH (ref 70–99)
Potassium: 5.3 mEq/L — ABNORMAL HIGH (ref 3.5–5.1)
Sodium: 138 mEq/L (ref 135–145)
Total Bilirubin: 0.7 mg/dL (ref 0.2–1.2)
Total Protein: 7.1 g/dL (ref 6.0–8.3)

## 2021-07-14 LAB — MICROALBUMIN / CREATININE URINE RATIO
Creatinine,U: 38.9 mg/dL
Microalb Creat Ratio: 1.8 mg/g (ref 0.0–30.0)
Microalb, Ur: <0.7 mg/dL (ref 0.0–1.9)

## 2021-07-14 LAB — HEMOGLOBIN A1C: Hgb A1c MFr Bld: 6.6 % — ABNORMAL HIGH (ref 4.6–6.5)

## 2021-07-17 ENCOUNTER — Ambulatory Visit (INDEPENDENT_AMBULATORY_CARE_PROVIDER_SITE_OTHER): Payer: Medicare Other

## 2021-07-17 DIAGNOSIS — Z0000 Encounter for general adult medical examination without abnormal findings: Secondary | ICD-10-CM | POA: Diagnosis not present

## 2021-07-17 NOTE — Progress Notes (Signed)
Virtual Visit via Telephone Note  I connected with  Casey Golden on 07/17/21 at 10:45 AM EDT by telephone and verified that I am speaking with the correct person using two identifiers.  Location: Patient: home Provider: [facility] Persons participating in the virtual visit: [place]   I discussed the limitations, risks, security and privacy concerns of performing an evaluation and management service by telephone and the availability of in person appointments. The patient expressed understanding and agreed to proceed.  Interactive audio and video telecommunications were attempted between this nurse and patient, however failed, due to patient having technical difficulties OR patient did not have access to video capability.  We continued and completed visit with audio only.  Some vital signs may be absent or patient reported.   Dionisio David, LPN  Subjective:   Casey Golden is a 71 y.o. male who presents for Medicare Annual/Subsequent preventive examination.  Review of Systems     Cardiac Risk Factors include: advanced age (>20mn, >>76women);male gender;diabetes mellitus     Objective:    There were no vitals filed for this visit. There is no height or weight on file to calculate BMI.     07/17/2021   10:52 AM 04/17/2021    9:18 AM 02/01/2021    5:23 PM 12/07/2020    4:25 PM 11/16/2020    2:09 PM 09/10/2020    1:09 PM 07/06/2020    1:17 PM  Advanced Directives  Does Patient Have a Medical Advance Directive? _0  No Yes  Type of AIT consultantof [place]  [place]?       No - copy requested  Would patient like information on creating a medical advance directive? No - Patient declined          Current Medications (verified) Outpatient Encounter Medications as of 07/17/2021  Medication Sig   aspirin 81 MG EC tablet Take 1 tablet (81 mg total) by mouth daily.  (Patient taking differently: Take 81 mg by mouth every evening.)   atorvastatin (LIPITOR) 40 MG tablet TAKE 1 TABLET BY MOUTH DAILY   Blood Glucose Monitoring Suppl (BLOOD GLUCOSE MONITOR SYSTEM) w/Device KIT 1 each by Does not apply route daily. Use as directed to check blood sugar once daily   diclofenac sodium (VOLTAREN) 1 % GEL APPLY 2 G TOPICALLY 3 (THREE) TIMES DAILY AS NEEDED (ANTI INFLAMMATORY).   gabapentin (NEURONTIN) 300 MG capsule TAKE 1 CAPSULE BY MOUTH 3  TIMES DAILY   Glucose Blood (BLOOD GLUCOSE TEST STRIPS) STRP 1 each by In Vitro route daily. Use as directed to check blood sugar once daily   ibuprofen (ADVIL,MOTRIN) 200 MG tablet Take 200-600 mg by mouth every 8 (eight) hours as needed for mild pain (severe back pain.).   Lancets MISC 1 each by Does not apply route daily. Use as directed to check blood sugar daily   losartan (COZAAR) 50 MG tablet Take 1 tablet (50 mg total) by mouth daily.   metFORMIN (GLUCOPHAGE) 1000 MG tablet TAKE 1 TABLET BY MOUTH TWICE  DAILY WITH A MEAL   PFIZER COVID-19 VAC BIVALENT injection    nitroGLYCERIN (NITROSTAT) 0.4 MG SL tablet Place 1 tablet (0.4 mg total) under the tongue every 5 (five) minutes as needed for chest pain (do not take more than 3 doses.).   No facility-administered encounter medications on file as of 07/17/2021.  Allergies (verified) Elemental sulfur, Imdur [isosorbide nitrate], and Sulfa antibiotics   History: Past Medical History:  Diagnosis Date   Arthritis    CAD (coronary artery disease)    cathx3 with nonobstructive disease. Last cath with RCA 40% stenosis in 2009   Cancer [facility])    Cataract 2019   bilateral; resolved with surgery   Colonic polyp    Fatty liver disease, nonalcoholic 9373   by [place]   GERD (gastroesophageal reflux disease)    History of chicken pox    Hyperlipidemia    Hypertension    Nocturia    OSA (obstructive sleep apnea)    CPAP, compliant   Paget's disease of bony pelvis    Past use of  tobacco    Quit 1990, 70 pack year history   Rash of genital area    09-08-2014  per pt Dr Junious Silk aware   Seasonal and perennial allergic rhinitis    Type 2 diabetes mellitus ([place])    Wears dentures    full upper/  partial lower   Wears glasses    Past Surgical History:  Procedure Laterality Date   ABDOMINAL HERNIA REPAIR  2007      [place]   open repair   APPENDECTOMY  age 28   CARDIAC CATHETERIZATION  12-23-2007   [place]   Abnormal myoview w/ ischemia/  40% mRCA with nonobstructive and no sig. plaque in his left system, EF 55%   CARDIAC CATHETERIZATION  Apr 2008    [facility]   Abnormal myoview/  50% RCA,  ef 65%   CARDIAC CATHETERIZATION  1999      [facility]   CATARACT EXTRACTION, BILATERAL Bilateral 11/2017   CERVICAL FUSION  1992   CHOLECYSTECTOMY OPEN  2006   COLONOSCOPY WITH PROPOFOL N/A 04/17/2016   TAs, high grade dysplasia with margins clear, diverticulosis Casey Lame, MD)   COLONOSCOPY WITH PROPOFOL N/A 08/07/2016   TAx1, diverticulosis, rpt 3 yrs ([place])   COLONOSCOPY WITH PROPOFOL N/A 01/05/2020   [facility], rpt Allen Norris, Darren, MD)   DENTAL SURGERY     metal dental implant L mandible   EXCISION OF SKIN TAG Right 09/14/2014   Procedure: EXCISION OF SKIN TAG;  Surgeon: Festus Aloe, MD;  Location: [facility];  Service: Urology;  Laterality: Right;   GROIN MASS OPEN BIOPSY Left 01/17/2019   HYDROCELE EXCISION Left 09/14/2014   Procedure: LEFT HYDROCELECTOMY ADULT;  Surgeon: Festus Aloe, MD;  Location: [facility];  Service: Urology;  Laterality: Left;   LEFT HEART CATH AND CORONARY ANGIOGRAPHY Left 02/15/2021   Procedure: LEFT HEART CATH AND CORONARY ANGIOGRAPHY;  Surgeon: Nelva Bush, MD;  Location: [place] [facility];  Service: Cardiovascular;  Laterality: Left;   MOHS SURGERY  2015   skin cancer   TONSILLECTOMY  age 14   Family History  Problem Relation Age of Onset   CAD Mother        MI   Diabetes Mother    Stroke Mother         mini-stroke   Cancer Father 22       lung (smoker)   Cancer Sister        lung   Heart disease Paternal Grandmother    Heart disease Paternal Grandfather    Diabetes Paternal Grandfather    Coronary artery disease Neg Hx        Premature   Sleep apnea Neg Hx    Social History   Socioeconomic History   Marital  status: Married    Spouse name: Not on file   Number of children: 3   Years of education: Not on file   Highest education level: Not on file  Occupational History   Occupation: Full time    Employer: [employer]  Tobacco Use   Smoking status: Former    Packs/day: 2.00    Years: 35.00    Total pack years: 70.00    Types: Cigarettes    Quit date: 01/29/1990    Years since quitting: 31.4   Smokeless tobacco: Never  Vaping Use   Vaping Use: Never used  Substance and Sexual Activity   Alcohol use: No   Drug use: No   Sexual activity: Yes  Other Topics Concern   Not on file  Social History Narrative   Lives with wife, dog and cats    Occupation: retired, was self employed, now works at [employer]    Edu: HS   Activity: walks 1.5 mi daily   Diet: some water, fruits/vegetables daily   Social Determinants of Health   Financial Resource Strain: Low Risk  (07/17/2021)   Overall Financial Resource Strain (CARDIA)    Difficulty of Paying Living Expenses: Not hard at all  Food Insecurity: No Food Insecurity (07/17/2021)   Hunger Vital Sign    Worried About Running Out of Food in the Last Year: Never true    [place] in the Last Year: Never true  Transportation Needs: No Transportation Needs (07/17/2021)   PRAPARE - Hydrologist (Medical): No    Lack of Transportation (Non-Medical): No  Physical Activity: Insufficiently Active (07/17/2021)   Exercise Vital Sign    Days of Exercise per Week: 3 days    Minutes of Exercise per Session: 30 min  Stress: No Stress Concern Present (07/17/2021)   [place]    Feeling of Stress : Not at all  Social Connections: Moderately Isolated (07/17/2021)   Social Connection and Isolation Panel [NHANES]    Frequency of Communication with Friends and Family: More than three times a week    Frequency of Social Gatherings with Friends and Family: More than three times a week    Attends Religious Services: Never    Marine scientist or Organizations: No    Attends Music therapist: Never    Marital Status: Married    Tobacco Counseling Counseling given: Not Answered   Clinical Intake:  Pre-visit preparation completed: Yes  Pain : No/denies pain     Nutritional Risks: None Diabetes: Yes CBG done?: No Did pt. bring in CBG monitor from home?: No  How often do you need to have someone help you when you read instructions, pamphlets, or other written materials from your doctor or pharmacy?: 1 - Never  Diabetic?yes Nutrition Risk Assessment:  Has the patient had any N/V/D within the last 2 months?  Yes  Does the patient have any non-healing wounds?  No  Has the patient had any unintentional weight loss or weight gain?  No   Diabetes:  Is the patient diabetic?  Yes  If diabetic, was a CBG obtained today?  No  Did the patient bring in their glucometer from home?  No  How often do you monitor your CBG's? Occasionally    Financial Strains and Diabetes Management:  Are you having any financial strains with the device, your supplies or your medication? No .  Does  the patient want to be seen by Chronic Care Management for management of their diabetes?  No  Would the patient like to be referred to a Nutritionist or for Diabetic Management?  No   Diabetic Exams:  Diabetic Eye Exam: Completed 10/18/20.  Pt has been advised about the importance in completing this exam.   Diabetic Foot Exam: Completed 11/25/19. Pt has been advised about the importance in completing this exam.     Interpreter Needed?: No  Information entered by :: Kirke Shaggy, LPN   Activities of Daily Living    07/17/2021   10:56 AM  In your present state of health, do you have any difficulty performing the following activities:  Hearing? 1  Vision? 0  Difficulty concentrating or making decisions? 0  Walking or climbing stairs? 1  Dressing or bathing? 0  Doing errands, shopping? 0  Preparing Food and eating ? N  Using the Toilet? N  In the past six months, have you accidently leaked urine? N  Do you have problems with loss of bowel control? N  Managing your Medications? N  Managing your Finances? N  Housekeeping or managing your Housekeeping? N    Patient Care Team: Ria Bush, MD as PCP - General (Family Medicine) Rockey Situ Kathlene November, MD as PCP - Cardiology (Cardiology) Minna Merritts, MD as Consulting Physician (Cardiology)  Indicate any recent Medical Services you may have received from other than [facility] providers in the past year (date may be approximate).     Assessment:   This is a routine wellness examination for Casey Golden.  Hearing/Vision screen Hearing Screening - Comments:: No aids Vision Screening - Comments:: Glasses to read- Dr.Richmond   Dietary issues and exercise activities discussed: Current Exercise Habits: Home exercise routine, Time (Minutes): 30, Frequency (Times/Week): 3, Weekly Exercise (Minutes/Week): 90, Intensity: Mild   Goals Addressed             This Visit's Progress    DIET - EAT MORE FRUITS AND VEGETABLES         Depression Screen    07/17/2021   10:48 AM 07/06/2020    1:22 PM 05/27/2019   11:01 AM 05/21/2018   12:31 PM 11/01/2017   10:45 AM 05/13/2017   10:10 AM 03/27/2017   12:14 PM  PHQ 2/9 Scores  PHQ - 2 Score 0 0 0 0 0 0 0  PHQ- 9 Score 0 0 3 0  0     Fall Risk    07/17/2021   10:55 AM 07/06/2020    1:19 PM 05/27/2019   10:05 AM 05/21/2018   12:31 PM 11/01/2017   10:45 AM  Fall Risk   Falls in the past year? 0 0 0  0 No  Number falls in past yr: 0 0     Injury with Fall? 0 0     Risk for fall due to : No Fall Risks Medication side effect     Follow up Falls evaluation completed Falls evaluation completed;Falls prevention discussed       FALL RISK PREVENTION PERTAINING TO THE HOME:  Any stairs in or around the home? Yes  If so, are there any without handrails? No  Home free of loose throw rugs in walkways, pet beds, electrical cords, etc? Yes  Adequate lighting in your home to reduce risk of falls? Yes   ASSISTIVE DEVICES UTILIZED TO PREVENT FALLS:  Life alert? No  Use of a cane, walker or w/c? No  Grab bars in the bathroom?  No  Shower chair or bench in shower? No  Elevated toilet seat or a handicapped toilet? No   TCognitive Function:    07/06/2020    1:27 PM 05/21/2018   12:31 PM 05/13/2017   10:10 AM  MMSE - Mini Mental State Exam  Orientation to time 5 5 5  Orientation to Place 5 5 5  Registration 3 3 3  Attention/ Calculation 5 0 0  Recall 3 2 2  Recall-comments  unable to recall 1 of 3 words unable to recall 1 of 3 words  Language- name 2 objects  0 0  Language- repeat 1 1 1  Language- follow 3 step command  0 1  Language- follow 3 step command-comments   unable to follow 1 step of 3 step command  Language- read & follow direction  0 0  Write a sentence  0 0  Copy design  0 0  Total score  16 17        07/17/2021   10:59 AM  6CIT Screen  What Year? 0 points  What month? 0 points  What time? 0 points  Count back from 20 0 points  Months in reverse 0 points  Repeat phrase 0 points  Total Score 0 points    Immunizations Immunization History  Administered Date(s) Administered   Influenza, High Dose Seasonal PF 11/30/2013, 09/15/2018, 09/03/2019, 10/26/2020   Influenza,inj,Quad PF,6+ Mos 10/15/2012, 09/30/2015, 09/06/2017   Influenza,inj,quad, With Preservative 10/29/2016   Influenza-Unspecified 11/01/2014, 10/13/2016   PFIZER Comirnaty(Gray Top)Covid-19 Tri-Sucrose  Vaccine 07/08/2020   PFIZER(Purple Top)SARS-COV-2 Vaccination 04/02/2019, 04/23/2019, 11/29/2019   PNEUMOCOCCAL CONJUGATE-20 09/09/2020   Pneumococcal Conjugate-13 09/05/2015   Pneumococcal Polysaccharide-23 01/30/2011, 10/13/2016, 10/18/2016, 04/07/2018, 04/18/2018   Td 10/27/2015   Tdap 10/27/2015, 06/12/2018   Zoster Recombinat (Shingrix) 09/13/2017, 12/15/2017   Zoster, Live 09/15/2012    TDAP status: Up to date  Flu Vaccine status: Up to date  Pneumococcal vaccine status: Up to date  Covid-19 vaccine status: Completed vaccines  Qualifies for Shingles Vaccine? Yes   Zostavax completed Yes   Shingrix Completed?: Yes  Screening Tests Health Maintenance  Topic Date Due   COVID-19 Vaccine (5 - Booster for Pfizer series) 09/02/2020   FOOT EXAM  11/24/2020   INFLUENZA VACCINE  08/29/2021   OPHTHALMOLOGY EXAM  10/18/2021   HEMOGLOBIN A1C  01/13/2022   COLONOSCOPY (Pts 45-49yrs Insurance coverage will need to be confirmed)  01/04/2025   TETANUS/TDAP  06/11/2028   Pneumonia Vaccine 65+ Years old  Completed   Hepatitis C Screening  Completed   Zoster Vaccines- Shingrix  Completed   HPV VACCINES  Aged Out    Health Maintenance  Health Maintenance Due  Topic Date Due   COVID-19 Vaccine (5 - Booster for Pfizer series) 09/02/2020   FOOT EXAM  11/24/2020    Colorectal cancer screening: Type of screening: Colonoscopy. Completed 01/05/20. Repeat every 5 years  Lung Cancer Screening: (Low Dose CT Chest recommended if Age 55-80 years, 30 pack-year currently smoking OR have quit w/in 15years.) does not qualify.    Additional Screening:  Hepatitis C Screening: does qualify; Completed 03/01/15  Vision Screening: Recommended annual ophthalmology exams for early detection of glaucoma and other disorders of the eye. Is the patient up to date with their annual eye exam?  Yes  Who is the provider or what is the name of the office in which the patient attends annual eye exams?  Dr.Richmond If pt is not established with a provider, would they like to   be referred to a provider to establish care? No .   Dental Screening: Recommended annual dental exams for proper oral hygiene  Community Resource Referral / Chronic Care Management: CRR required this visit?  No   CCM required this visit?  No      Plan:     I have personally reviewed and noted the following in the patient's chart:   Medical and social history Use of alcohol, tobacco or illicit drugs  Current medications and supplements including opioid prescriptions. Patient is not currently taking opioid prescriptions. Functional ability and status Nutritional status Physical activity Advanced directives List of other physicians Hospitalizations, surgeries, and ER visits in previous 12 months Vitals Screenings to include cognitive, depression, and falls Referrals and appointments  In addition, I have reviewed and discussed with patient certain preventive protocols, quality metrics, and best practice recommendations. A written personalized care plan for preventive services as well as general preventive health recommendations were provided to patient.     Lorrie S Barnes, LPN   07/17/2021   Nurse Notes: none    

## 2021-07-17 NOTE — Patient Instructions (Signed)
Casey Golden , Thank you for taking time to come for your Medicare Wellness Visit. I appreciate your ongoing commitment to your health goals. Please review the following plan we discussed and let me know if I can assist you in the future.   Screening recommendations/referrals: Colonoscopy: 01/05/20 Recommended yearly ophthalmology/optometry visit for glaucoma screening and checkup Recommended yearly dental visit for hygiene and checkup  Vaccinations: Influenza vaccine: 10/26/20 Pneumococcal vaccine: 09/05/15 Tdap vaccine: 06/12/18 Shingles vaccine: Zostavax 09/15/12  Shingrix 09/13/17, 12/15/17   Covid-19: 04/02/19, 04/23/19, 11/29/19, 07/08/20, 02/09/21  Advanced directives: no  Conditions/risks identified: none  Next appointment: Follow up in one year for your annual wellness visit. 07/19/22  @ 10am by phone  Preventive Care 65 Years and Older, Male Preventive care refers to lifestyle choices and visits with your health care provider that can promote health and wellness. What does preventive care include? A yearly physical exam. This is also called an annual well check. Dental exams once or twice a year. Routine eye exams. Ask your health care provider how often you should have your eyes checked. Personal lifestyle choices, including: Daily care of your teeth and gums. Regular physical activity. Eating a healthy diet. Avoiding tobacco and drug use. Limiting alcohol use. Practicing safe sex. Taking low doses of aspirin every day. Taking vitamin and mineral supplements as recommended by your health care provider. What happens during an annual well check? The services and screenings done by your health care provider during your annual well check will depend on your age, overall health, lifestyle risk factors, and family history of disease. Counseling  Your health care provider may ask you questions about your: Alcohol use. Tobacco use. Drug use. Emotional well-being. Home and relationship  well-being. Sexual activity. Eating habits. History of falls. Memory and ability to understand (cognition). Work and work Statistician. Screening  You may have the following tests or measurements: Height, weight, and BMI. Blood pressure. Lipid and cholesterol levels. These may be checked every 5 years, or more frequently if you are over 96 years old. Skin check. Lung cancer screening. You may have this screening every year starting at age 60 if you have a 30-pack-year history of smoking and currently smoke or have quit within the past 15 years. Fecal occult blood test (FOBT) of the stool. You may have this test every year starting at age 62. Flexible sigmoidoscopy or colonoscopy. You may have a sigmoidoscopy every 5 years or a colonoscopy every 10 years starting at age 28. Prostate cancer screening. Recommendations will vary depending on your family history and other risks. Hepatitis C blood test. Hepatitis B blood test. Sexually transmitted disease (STD) testing. Diabetes screening. This is done by checking your blood sugar (glucose) after you have not eaten for a while (fasting). You may have this done every 1-3 years. Abdominal aortic aneurysm (AAA) screening. You may need this if you are a current or former smoker. Osteoporosis. You may be screened starting at age 54 if you are at high risk. Talk with your health care provider about your test results, treatment options, and if necessary, the need for more tests. Vaccines  Your health care provider may recommend certain vaccines, such as: Influenza vaccine. This is recommended every year. Tetanus, diphtheria, and acellular pertussis (Tdap, Td) vaccine. You may need a Td booster every 10 years. Zoster vaccine. You may need this after age 5. Pneumococcal 13-valent conjugate (PCV13) vaccine. One dose is recommended after age 37. Pneumococcal polysaccharide (PPSV23) vaccine. One dose is recommended after  age 45. Talk to your health care  provider about which screenings and vaccines you need and how often you need them. This information is not intended to replace advice given to you by your health care provider. Make sure you discuss any questions you have with your health care provider. Document Released: 02/11/2015 Document Revised: 10/05/2015 Document Reviewed: 11/16/2014 Elsevier Interactive Patient Education  2017 [place] Prevention in the Home Falls can cause injuries. They can happen to people of all ages. There are many things you can do to make your home safe and to help prevent falls. What can I do on the outside of my home? Regularly fix the edges of walkways and driveways and fix any cracks. Remove anything that might make you trip as you walk through a door, such as a raised step or threshold. Trim any bushes or trees on the path to your home. Use bright outdoor lighting. Clear any walking paths of anything that might make someone trip, such as rocks or tools. Regularly check to see if handrails are loose or broken. Make sure that both sides of any steps have handrails. Any raised decks and porches should have guardrails on the edges. Have any leaves, snow, or ice cleared regularly. Use sand or salt on walking paths during winter. Clean up any spills in your garage right away. This includes oil or grease spills. What can I do in the bathroom? Use night lights. Install grab bars by the toilet and in the tub and shower. Do not use towel bars as grab bars. Use non-skid mats or decals in the tub or shower. If you need to sit down in the shower, use a plastic, non-slip stool. Keep the floor dry. Clean up any water that spills on the floor as soon as it happens. Remove soap buildup in the tub or shower regularly. Attach bath mats securely with double-sided non-slip rug tape. Do not have throw rugs and other things on the floor that can make you trip. What can I do in the bedroom? Use night lights. Make  sure that you have a light by your bed that is easy to reach. Do not use any sheets or blankets that are too big for your bed. They should not hang down onto the floor. Have a firm chair that has side arms. You can use this for support while you get dressed. Do not have throw rugs and other things on the floor that can make you trip. What can I do in the kitchen? Clean up any spills right away. Avoid walking on wet floors. Keep items that you use a lot in easy-to-reach places. If you need to reach something above you, use a strong step stool that has a grab bar. Keep electrical cords out of the way. Do not use floor polish or wax that makes floors slippery. If you must use wax, use non-skid floor wax. Do not have throw rugs and other things on the floor that can make you trip. What can I do with my stairs? Do not leave any items on the stairs. Make sure that there are handrails on both sides of the stairs and use them. Fix handrails that are broken or loose. Make sure that handrails are as long as the stairways. Check any carpeting to make sure that it is firmly attached to the stairs. Fix any carpet that is loose or worn. Avoid having throw rugs at the top or bottom of the stairs. If you do  have throw rugs, attach them to the floor with carpet tape. Make sure that you have a light switch at the top of the stairs and the bottom of the stairs. If you do not have them, ask someone to add them for you. What else can I do to help prevent falls? Wear shoes that: Do not have high heels. Have rubber bottoms. Are comfortable and fit you well. Are closed at the toe. Do not wear sandals. If you use a stepladder: Make sure that it is fully opened. Do not climb a closed stepladder. Make sure that both sides of the stepladder are locked into place. Ask someone to hold it for you, if possible. Clearly mark and make sure that you can see: Any grab bars or handrails. First and last steps. Where the  edge of each step is. Use tools that help you move around (mobility aids) if they are needed. These include: Canes. Walkers. Scooters. Crutches. Turn on the lights when you go into a dark area. Replace any light bulbs as soon as they burn out. Set up your furniture so you have a clear path. Avoid moving your furniture around. If any of your floors are uneven, fix them. If there are any pets around you, be aware of where they are. Review your medicines with your doctor. Some medicines can make you feel dizzy. This can increase your chance of falling. Ask your doctor what other things that you can do to help prevent falls. This information is not intended to replace advice given to you by your health care provider. Make sure you discuss any questions you have with your health care provider. Document Released: 11/11/2008 Document Revised: 06/23/2015 Document Reviewed: 02/19/2014 Elsevier Interactive Patient Education  2017 [employer].

## 2021-07-21 ENCOUNTER — Ambulatory Visit (INDEPENDENT_AMBULATORY_CARE_PROVIDER_SITE_OTHER): Payer: 59 | Admitting: Family Medicine

## 2021-07-21 ENCOUNTER — Encounter: Payer: Self-pay | Admitting: Family Medicine

## 2021-07-21 VITALS — BP 128/64 | HR 78 | Temp 97.6°F | Ht 68.0 in | Wt 241.2 lb

## 2021-07-21 DIAGNOSIS — E875 Hyperkalemia: Secondary | ICD-10-CM | POA: Diagnosis not present

## 2021-07-21 DIAGNOSIS — G4733 Obstructive sleep apnea (adult) (pediatric): Secondary | ICD-10-CM | POA: Diagnosis not present

## 2021-07-21 DIAGNOSIS — K760 Fatty (change of) liver, not elsewhere classified: Secondary | ICD-10-CM | POA: Diagnosis not present

## 2021-07-21 DIAGNOSIS — E785 Hyperlipidemia, unspecified: Secondary | ICD-10-CM

## 2021-07-21 DIAGNOSIS — I700 Atherosclerosis of aorta: Secondary | ICD-10-CM | POA: Diagnosis not present

## 2021-07-21 DIAGNOSIS — J432 Centrilobular emphysema: Secondary | ICD-10-CM

## 2021-07-21 DIAGNOSIS — E1169 Type 2 diabetes mellitus with other specified complication: Secondary | ICD-10-CM

## 2021-07-21 DIAGNOSIS — I10 Essential (primary) hypertension: Secondary | ICD-10-CM | POA: Diagnosis not present

## 2021-07-21 DIAGNOSIS — I25118 Atherosclerotic heart disease of native coronary artery with other forms of angina pectoris: Secondary | ICD-10-CM | POA: Diagnosis not present

## 2021-07-21 DIAGNOSIS — C4499 Other specified malignant neoplasm of skin, unspecified: Secondary | ICD-10-CM | POA: Diagnosis not present

## 2021-07-21 DIAGNOSIS — Z0000 Encounter for general adult medical examination without abnormal findings: Secondary | ICD-10-CM | POA: Diagnosis not present

## 2021-07-21 LAB — POTASSIUM: Potassium: 4.3 mEq/L (ref 3.5–5.1)

## 2021-07-21 MED ORDER — GABAPENTIN 300 MG PO CAPS: 300.0000 mg | ORAL_CAPSULE | Freq: Three times a day (TID) | ORAL | 3 refills | Status: DC

## 2021-07-21 MED ORDER — METFORMIN HCL 1000 MG PO TABS: 1000.0000 mg | ORAL_TABLET | Freq: Two times a day (BID) | ORAL | 3 refills | Status: DC

## 2021-07-21 MED ORDER — LOSARTAN POTASSIUM 50 MG PO TABS: 50.0000 mg | ORAL_TABLET | Freq: Every day | ORAL | 3 refills | Status: DC

## 2021-07-21 MED ORDER — ATORVASTATIN CALCIUM 40 MG PO TABS: 40.0000 mg | ORAL_TABLET | Freq: Every day | ORAL | 3 refills | Status: DC

## 2021-07-21 NOTE — Assessment & Plan Note (Signed)
By imaging, pt asxs.

## 2021-07-21 NOTE — Assessment & Plan Note (Signed)
Continue aspirin, statin.  

## 2021-07-21 NOTE — Assessment & Plan Note (Addendum)
Continues CPAP, followed by neurology.

## 2021-07-21 NOTE — Assessment & Plan Note (Signed)
Chronic, stable. Continue metformin.  

## 2021-07-21 NOTE — Assessment & Plan Note (Signed)
Preventative protocols reviewed and updated unless pt declined. Discussed healthy diet and lifestyle.  

## 2021-07-21 NOTE — Assessment & Plan Note (Signed)
May be due for derm f/u - will call to schedule appt.

## 2021-07-21 NOTE — Assessment & Plan Note (Signed)
Chronic, stable on atorvastatin - continue. ?The ASCVD Risk score (Arnett DK, et al., 2019) failed to calculate for the following reasons: ?  The valid total cholesterol range is 130 to 320 mg/dL  ?

## 2021-08-16 ENCOUNTER — Telehealth: Payer: Self-pay | Admitting: Family Medicine

## 2021-08-16 NOTE — Telephone Encounter (Signed)
Will evaluate in office

## 2021-08-16 NOTE — Telephone Encounter (Signed)
Patient called in stating he is having neck pain that starts at the bottom of his neck and going up about 3/4. Stated that it last happened on Monday. Patient was triaged.

## 2021-08-16 NOTE — Telephone Encounter (Signed)
[facility] - Client TELEPHONE ADVICE RECORD [facility] Patient Name: Casey Golden Gender: Male DOB: Apr 09, 1950 Age: 71 Y 16 M 17 D Return Phone Number: [phone] (Primary) Address: City/ State/ Zip: [place]  [address] Client [facility] - Client Client Site [facility] Provider Ria Bush - MD Contact Type Call Who Is Calling Patient / Member / Family / Caregiver Call Type Triage / Clinical Relationship To Patient Self Return Phone Number [phone] (Primary) Chief Complaint Neck Stiffness Reason for Call Symptomatic / Request for Lucas states she has a pt on the line and he is having pain in his neck from bottom of neck to about 3/4 up his neck. Happend Monday not Tuesday and it comes and goes and when it does come it stays for a couple hours and goes away. Translation No Nurse Assessment Nurse: Ronnald Ramp, RN, Miranda Date/Time (Eastern Time): 08/16/2021 12:47:18 PM Confirm and document reason for call. If symptomatic, describe symptoms. ---Caller states he is having pain that starts at the base of his skull and going up about 2-3 inches. The pain has been off and on since Monday. No known injury. Does the patient have any new or worsening symptoms? ---Yes Will a triage be completed? ---Yes Related visit to physician within the last 2 weeks? ---No Does the PT have any chronic conditions? (i.e. diabetes, asthma, this includes High risk factors for pregnancy, etc.) ---Yes List chronic conditions. ---Heart, Diabetes, Neuropathy Is this a behavioral health or substance abuse call? ---No Guidelines Guideline Title Affirmed Question Affirmed Notes Nurse Date/Time Eilene Ghazi Time) Neck Pain or Stiffness High-risk adult (e.g., history of cancer, HIV, or IV drug use) Ronnald Ramp, RN, Miranda 08/16/2021 12:50:42 PM Disp. Time Eilene Ghazi Time) Disposition  Final User 08/16/2021 1:01:51 PM See PCP within 24 Hours Yes Ronnald Ramp, RN, Miranda PLEASE NOTE: All timestamps contained within this report are represented as [place] Standard Time. CONFIDENTIALTY NOTICE: This fax transmission is intended only for the addressee. It contains information that is legally privileged, confidential or otherwise protected from use or disclosure. If you are not the intended recipient, you are strictly prohibited from reviewing, disclosing, copying using or disseminating any of this information or taking any action in reliance on or regarding this information. If you have received this fax in error, please notify us immediately by telephone so that we can arrange for its return to [place]. Phone: [phone], Toll-Free: [phone], Fax: [phone] Page: 2 of 2 Call Id: [id number] Final Disposition 08/16/2021 1:01:51 PM See PCP within 24 Hours Yes Ronnald Ramp, RN, Miranda Caller Disagree/Comply Comply Caller Understands Yes PreDisposition Call Doctor Care Advice Given Per Guideline SEE PCP WITHIN 24 HOURS: * IF OFFICE WILL BE OPEN: You need to be examined within the next 24 hours. Call your doctor (or NP/PA) when the office opens and make an appointment. PAIN MEDICINES: * For pain relief, you can take either acetaminophen, ibuprofen, or naproxen. * They are over-the-counter (OTC) pain drugs. You can buy them at the drugstore. * IBUPROFEN (E.G., MOTRIN, ADVIL): Take 400 mg (two 200 mg pills) by mouth every 6 hours. The most you should take is 6 pills a day (1,200 mg total). CALL BACK IF: * Fever occurs * You become worse CARE ADVICE given per Neck Pain (Adult) guideline. Referrals REFERRED TO PCP OFFIC

## 2021-08-16 NOTE — Telephone Encounter (Signed)
Per appt notes pt already has appt scheduled.  Patient called back, stated access told him to be seen in 24 hours Scheduled with Cable 12 noon on 7.20.23      Sending note to Romilda Garret NP.

## 2021-08-16 NOTE — Telephone Encounter (Signed)
Patient called back, stated access told him to be seen in 24 hours Scheduled with Cable 10:40am on 7.20.23

## 2021-08-17 ENCOUNTER — Ambulatory Visit (INDEPENDENT_AMBULATORY_CARE_PROVIDER_SITE_OTHER): Payer: Medicare Other | Admitting: Nurse Practitioner

## 2021-08-17 ENCOUNTER — Ambulatory Visit: Payer: 59 | Admitting: Nurse Practitioner

## 2021-08-17 VITALS — BP 136/62 | HR 69 | Temp 97.3°F | Resp 12 | Ht 68.0 in | Wt 241.1 lb

## 2021-08-17 DIAGNOSIS — R519 Headache, unspecified: Secondary | ICD-10-CM

## 2021-08-17 LAB — COMPREHENSIVE METABOLIC PANEL
ALT: 47 U/L (ref 0–53)
AST: 46 U/L — ABNORMAL HIGH (ref 0–37)
Albumin: 4.4 g/dL (ref 3.5–5.2)
Alkaline Phosphatase: 59 U/L (ref 39–117)
BUN: 15 mg/dL (ref 6–23)
CO2: 32 mEq/L (ref 19–32)
Calcium: 9.6 mg/dL (ref 8.4–10.5)
Chloride: 101 mEq/L (ref 96–112)
Creatinine, Ser: 0.79 mg/dL (ref 0.40–1.50)
GFR: 88.26 mL/min (ref >60.00)
Glucose, Bld: 123 mg/dL — ABNORMAL HIGH (ref 70–99)
Potassium: 4.4 mEq/L (ref 3.5–5.1)
Sodium: 139 mEq/L (ref 135–145)
Total Bilirubin: 0.7 mg/dL (ref 0.2–1.2)
Total Protein: 7.1 g/dL (ref 6.0–8.3)

## 2021-08-17 LAB — CBC
HCT: 45.4 % (ref 39.0–52.0)
Hemoglobin: 15.6 g/dL (ref 13.0–17.0)
MCHC: 34.3 g/dL (ref 30.0–36.0)
MCV: 98.9 fl (ref 78.0–100.0)
Platelets: 229 10*3/uL (ref 150.0–400.0)
RBC: 4.59 Mil/uL (ref 4.22–5.81)
RDW: 12 % (ref 11.5–15.5)
WBC: 8.7 10*3/uL (ref 4.0–10.5)

## 2021-08-17 LAB — SEDIMENTATION RATE: Sed Rate: 5 mm/hr (ref 0–20)

## 2021-08-17 MED ORDER — METHOCARBAMOL 500 MG PO TABS: 500.0000 mg | ORAL_TABLET | Freq: Two times a day (BID) | ORAL | 0 refills | Status: DC | PRN

## 2021-08-17 NOTE — Progress Notes (Signed)
Acute Office Visit  Subjective:     Patient ID: Casey Golden, male    DOB: 1950-06-24, 71 y.o.   MRN: [id number]  Chief Complaint  Patient presents with   Neck Pain    Pain right back side of the neck close to the middle of the neck about 3 days ago-08/14/21, (recurrent, started a few months ago)and sometimes has a pressure sensation on top of his head when he gets pain on the side of the neck. Sometimes gets a little dizzy.      Patient is in today for neck pain   Symptoms started approx 3 days ago. No injury to head or neck. States that this has happened to him before. States that it happened approx 6-8 months ago.  Intermittent in nature. Described as a pressure. Will have a sharp sensation sometimes. States that he has tried tylenol that may have helped. If he rests for a bit it will go away.   Review of Systems  Constitutional:  Negative for chills and fever.  Eyes:  Negative for double vision.       Wears glasses intermittent since catar  Cardiovascular:  Negative for chest pain.  Musculoskeletal:  Positive for neck pain.  Neurological:  Negative for headaches.        Objective:    BP 136/62   Pulse 69   Temp (!) 97.3 F (36.3 C)   Resp 12   Ht '5\' 8"'$  (1.727 m)   Wt 241 lb 2 oz (109.4 kg)   SpO2 99%   BMI 36.66 kg/m    Physical Exam Vitals and nursing note reviewed.  Constitutional:      Appearance: Normal appearance.  HENT:     Right Ear: Tympanic membrane, ear canal and external ear normal.     Left Ear: Tympanic membrane, ear canal and external ear normal.  Eyes:     Extraocular Movements: Extraocular movements intact.     Pupils: Pupils are equal, round, and reactive to light.  Cardiovascular:     Rate and Rhythm: Normal rate and regular rhythm.     Heart sounds: Normal heart sounds.  Pulmonary:     Effort: Pulmonary effort is normal.     Breath sounds: Normal breath sounds.  Musculoskeletal:        General: Tenderness present. No signs of  injury.  Lymphadenopathy:     Cervical: No cervical adenopathy.  Neurological:     General: No focal deficit present.     Mental Status: He is alert.     Cranial Nerves: Cranial nerves 2-12 are intact.     Sensory: Sensation is intact.     Motor: No weakness.     Coordination: Finger-Nose-Finger Test normal.     Gait: Gait is intact.     Deep Tendon Reflexes:     Reflex Scores:      Bicep reflexes are 2+ on the right side and 2+ on the left side.      Patellar reflexes are 2+ on the right side and 2+ on the left side.    Comments: Bilateral upper and lower extremity strength 5/5     Results for orders placed or performed in visit on 08/17/21  CBC  Result Value Ref Range   WBC 8.7 4.0 - 10.5 K/uL   RBC 4.59 4.22 - 5.81 Mil/uL   Platelets 229.0 150.0 - 400.0 K/uL   Hemoglobin 15.6 13.0 - 17.0 g/dL   HCT 45.4 39.0 - 52.0 %  MCV 98.9 78.0 - 100.0 fl   MCHC 34.3 30.0 - 36.0 g/dL   RDW 12.0 11.5 - 15.5 %  Comprehensive metabolic panel  Result Value Ref Range   Sodium 139 135 - 145 mEq/L   Potassium 4.4 3.5 - 5.1 mEq/L   Chloride 101 96 - 112 mEq/L   CO2 32 19 - 32 mEq/L   Glucose, Bld 123 (H) 70 - 99 mg/dL   BUN 15 6 - 23 mg/dL   Creatinine, Ser 0.79 0.40 - 1.50 mg/dL   Total Bilirubin 0.7 0.2 - 1.2 mg/dL   Alkaline Phosphatase 59 39 - 117 U/L   AST 46 (H) 0 - 37 U/L   ALT 47 0 - 53 U/L   Total Protein 7.1 6.0 - 8.3 g/dL   Albumin 4.4 3.5 - 5.2 g/dL   GFR 88.26 >60.00 mL/min   Calcium 9.6 8.4 - 10.5 mg/dL  Sedimentation rate  Result Value Ref Range   Sed Rate 5 0 - 20 mm/hr        Assessment & Plan:   Problem List Items Addressed This Visit       Other   Nonintractable headache - Primary    Patient has symptoms previously.  Has had work-up in the emergency department concerning for TIA or stroke that was negative in that regard did show possible normal pressure hydrocephalus.  Patient's neurological exam benign today in office.  Did review signs and symptoms  when to seek urgent emergent healthcare.  We will trial patient with a low-dose muscle relaxer methocarbamol 500 mg twice daily.  Did discuss with patient they can make him sleepy and dizzy so he needs to be careful.  If this does not resolve he needs to follow-up.      Relevant Medications   methocarbamol (ROBAXIN) 500 MG tablet   Other Relevant Orders   CBC (Completed)   Comprehensive metabolic panel (Completed)   Sedimentation rate (Completed)    Meds ordered this encounter  Medications   methocarbamol (ROBAXIN) 500 MG tablet    Sig: Take 1 tablet (500 mg total) by mouth 2 (two) times daily as needed for muscle spasms.    Dispense:  20 tablet    Refill:  0    Order Specific Question:   Supervising Provider    Answer:   TOWER, MARNE A [[id number]    Return if symptoms worsen or fail to improve.  Romilda Garret, NP

## 2021-08-17 NOTE — Patient Instructions (Signed)
Nice to see you today Try the muscle relaxer and see if that helps. Your can also try some over the counter voltaren gel to help with the pain Follow up if no improvement

## 2021-08-17 NOTE — Assessment & Plan Note (Signed)
Patient has symptoms previously.  Has had work-up in the emergency department concerning for TIA or stroke that was negative in that regard did show possible normal pressure hydrocephalus.  Patient's neurological exam benign today in office.  Did review signs and symptoms when to seek urgent emergent healthcare.  We will trial patient with a low-dose muscle relaxer methocarbamol 500 mg twice daily.  Did discuss with patient they can make him sleepy and dizzy so he needs to be careful.  If this does not resolve he needs to follow-up.

## 2021-09-05 ENCOUNTER — Telehealth: Payer: Self-pay

## 2021-09-05 NOTE — Telephone Encounter (Signed)
If any fever, yellowing of skin, or recurrent vomiting or worsening pain, recommend urgent evaluation.

## 2021-09-05 NOTE — Telephone Encounter (Signed)
I spoke with pt; pt is not supposed to drive and his wife will be home in little while and they will discuss if pt needs to go to ED or not. Pt said last night had bad abd pain and abd was extremely hard and pt vomited a small trash can full last night. Pt said today still some pain and the upper abd does not feel as tight and hard as last night. Pt just woke up and said he will call back after speaks with wife whether he is going to ED or not. Pt said he already has appt with Dr Darnell Level scheduled for 09/06/21 and pt does not want to cancel that appt even if he goes to ED. UC & ED precautions given and pt voiced understanding and appreciative of call.sending note to DR Valinda Hoar CMA and will teams lisa also.

## 2021-09-05 NOTE — Telephone Encounter (Signed)
[facility] - Client TELEPHONE ADVICE RECORD [facility] Patient Name: Casey Golden Gender: Male DOB: Jun 18, 1950 Age: 71 Y 50 M 6 D Return Phone Number: [phone] (Primary) Address[place]  [address] Client [facility] - Client Client Site [facility] Provider Ria Bush - MD Contact Type Call Who Is Calling Patient / Member / Family / Caregiver Call Type Triage / Clinical Relationship To Patient Self Return Phone Number [phone] (Primary) Chief Complaint Abdominal Pain Reason for Call Symptomatic / Request for [place] states she is calling from the office with a pt on the line. Caller states he is having stomach pain and its hard and comes and goes.Caller states he has an appt for tomorrow. Caller states last night he threw up couple times and then it hurt. caller states it was severe last night but its not now. Translation No Nurse Assessment Nurse: Ysidro Evert, RN, Levada Dy Date/Time (Eastern Time): 09/05/2021 10:17:37 AM Confirm and document reason for call. If symptomatic, describe symptoms. ---Caller states he is having abdominal pain since last night. He states his stomach looks swollen. No fever Does the patient have any new or worsening symptoms? ---Yes Will a triage be completed? ---Yes Related visit to physician within the last 2 weeks? ---No Does the PT have any chronic conditions? (i.e. diabetes, asthma, this includes High risk factors for pregnancy, etc.) ---Yes List chronic conditions. ---diabetes Is this a behavioral health or substance abuse call? ---No Guidelines Guideline Title Affirmed Question Affirmed Notes Nurse Date/Time (Eastern Time) Abdominal Pain - Male Black or tarry bowel movements (Exception: Chronicunchanged black-grey BMs AND is taking iron pills or PeptoBismol.) Ysidro Evert, RN, Levada Dy 09/05/2021  10:18:49 AM PLEASE NOTE: All timestamps contained within this report are represented as [place] Standard Time. CONFIDENTIALTY NOTICE: This fax transmission is intended only for the addressee. It contains information that is legally privileged, confidential or otherwise protected from use or disclosure. If you are not the intended recipient, you are strictly prohibited from reviewing, disclosing, copying using or disseminating any of this information or taking any action in reliance on or regarding this information. If you have received this fax in error, please notify us immediately by telephone so that we can arrange for its return to [place]. Phone: [phone], Toll-Free: [phone], Fax: [phone] Page: 2 of 2 Call Id: [id number] [place]. Time Eilene Ghazi Time) Disposition Final User 09/05/2021 10:00:17 AM Attempt made - message left Earleen Reaper 09/05/2021 10:24:13 AM Go to ED Now Yes Ysidro Evert, RN, Levada Dy Final Disposition 09/05/2021 10:24:13 AM Go to ED Now Yes Ysidro Evert, RN, Marin Shutter Disagree/Comply Comply Caller Understands Yes PreDisposition Did not know what to do Care Advice Given Per Guideline GO TO ED NOW: * You need to be seen in the Emergency Department. * Go to the ED at ___________ Hospital. * Another adult should drive. * Leave now. Drive carefully. NOTE TO TRIAGER - DRIVING: CARE ADVICE given per Abdominal Pain - Male (Adult) guideline. Referrals [place]

## 2021-09-05 NOTE — Telephone Encounter (Signed)
Pt called in stated he's not going to the Ed unless things worsen but will make his appointment with PCP

## 2021-09-05 NOTE — Telephone Encounter (Signed)
Spoke with pt asking Dr. Synthia Innocent question and relayed message. Pt denies any of sxs asked about but will go to ED if he gets worse.  Says he wants to keep OV with Dr. Darnell Level tomorrow at 11:30.

## 2021-09-06 ENCOUNTER — Ambulatory Visit (INDEPENDENT_AMBULATORY_CARE_PROVIDER_SITE_OTHER)
Admission: RE | Admit: 2021-09-06 | Discharge: 2021-09-06 | Disposition: A | Discharge status: Home / Self Care | Payer: 59 | Source: Ambulatory Visit | Attending: Family Medicine | Admitting: Family Medicine

## 2021-09-06 ENCOUNTER — Ambulatory Visit (INDEPENDENT_AMBULATORY_CARE_PROVIDER_SITE_OTHER): Payer: 59 | Admitting: Family Medicine

## 2021-09-06 ENCOUNTER — Encounter: Payer: Self-pay | Admitting: Family Medicine

## 2021-09-06 VITALS — BP 146/70 | HR 70 | Temp 97.8°F | Ht 68.0 in | Wt 244.2 lb

## 2021-09-06 DIAGNOSIS — E1169 Type 2 diabetes mellitus with other specified complication: Secondary | ICD-10-CM | POA: Diagnosis not present

## 2021-09-06 DIAGNOSIS — R1030 Lower abdominal pain, unspecified: Secondary | ICD-10-CM | POA: Insufficient documentation

## 2021-09-06 LAB — CBC WITH DIFFERENTIAL/PLATELET
Basophils Absolute: 0.1 10*3/uL (ref 0.0–0.1)
Basophils Relative: 0.7 % (ref 0.0–3.0)
Eosinophils Absolute: 0.7 10*3/uL (ref 0.0–0.7)
Eosinophils Relative: 7.5 % — ABNORMAL HIGH (ref 0.0–5.0)
HCT: 44.5 % (ref 39.0–52.0)
Hemoglobin: 15.4 g/dL (ref 13.0–17.0)
Lymphocytes Relative: 37.4 % (ref 12.0–46.0)
Lymphs Abs: 3.2 10*3/uL (ref 0.7–4.0)
MCHC: 34.6 g/dL (ref 30.0–36.0)
MCV: 99.2 fl (ref 78.0–100.0)
Monocytes Absolute: 0.7 10*3/uL (ref 0.1–1.0)
Monocytes Relative: 8.5 % (ref 3.0–12.0)
Neutro Abs: 4 10*3/uL (ref 1.4–7.7)
Neutrophils Relative %: 45.9 % (ref 43.0–77.0)
Platelets: 241 10*3/uL (ref 150.0–400.0)
RBC: 4.49 Mil/uL (ref 4.22–5.81)
RDW: 12 % (ref 11.5–15.5)
WBC: 8.7 10*3/uL (ref 4.0–10.5)

## 2021-09-06 LAB — COMPREHENSIVE METABOLIC PANEL
ALT: 46 U/L (ref 0–53)
AST: 44 U/L — ABNORMAL HIGH (ref 0–37)
Albumin: 4.5 g/dL (ref 3.5–5.2)
Alkaline Phosphatase: 62 U/L (ref 39–117)
BUN: 16 mg/dL (ref 6–23)
CO2: 35 mEq/L — ABNORMAL HIGH (ref 19–32)
Calcium: 9.3 mg/dL (ref 8.4–10.5)
Chloride: 98 mEq/L (ref 96–112)
Creatinine, Ser: 0.83 mg/dL (ref 0.40–1.50)
GFR: 86.92 mL/min (ref >60.00)
Glucose, Bld: 229 mg/dL — ABNORMAL HIGH (ref 70–99)
Potassium: 3.7 mEq/L (ref 3.5–5.1)
Sodium: 137 mEq/L (ref 135–145)
Total Bilirubin: 0.8 mg/dL (ref 0.2–1.2)
Total Protein: 7.6 g/dL (ref 6.0–8.3)

## 2021-09-06 LAB — LIPASE: Lipase: 81 U/L — ABNORMAL HIGH (ref 11.0–59.0)

## 2021-09-06 MED ORDER — GABAPENTIN 300 MG PO CAPS: 300.0000 mg | ORAL_CAPSULE | Freq: Three times a day (TID) | ORAL | 3 refills | Status: DC

## 2021-09-06 MED ORDER — AMOXICILLIN-POT CLAVULANATE 875-125 MG PO TABS: 1.0000 | ORAL_TABLET | Freq: Two times a day (BID) | ORAL | 0 refills | Status: AC

## 2021-09-06 NOTE — Progress Notes (Signed)
Patient ID: Casey Golden, male    DOB: [birth year], 71 y.o.   MRN: [id number]  This visit was conducted in person.  BP (!) 146/70   Pulse 70   Temp 97.8 F (36.6 C) (Temporal)   Ht _0  (1.727 m)   Wt 244 lb 4 oz (110.8 kg)   SpO2 95%   BMI 37.14 kg/m    CC: abd pain  Subjective:   HPI: Casey Golden is a 71 y.o. male presenting on 09/06/2021 for Abdominal Pain (C/o abd pain, firmness and vomiting. Sxs started 09/04/21. Had BM today and feels a little better and was not black this time. )   2d h/o abdominal pain described as dull ache, mid abdominal tightness, associated with nausea/vomiting x3-4. Has had dark loose stools with some watery diarrhea, but not more malodorous. At its worse 5-6/10. No BM yesterday but still passing gas yesterday. Today's BM was mildly constipated.   No fevers/chills, blood in stool, urinary symptom, gassiness or indigestion.   No sick contacts at home.  He has been eating more figs recently.   S/p appendectomy age 81yo S/p cholecystectomy 2006   COLONOSCOPY WITH PROPOFOL - 01/05/2020 [facility], rpt 5 yrs Allen Norris, Darren, MD)     Relevant past medical, surgical, family and social history reviewed and updated as indicated. Interim medical history since our last visit reviewed. Allergies and medications reviewed and updated. Outpatient Medications Prior to Visit  Medication Sig Dispense Refill   aspirin 81 MG EC tablet Take 1 tablet (81 mg total) by mouth daily. (Patient taking differently: Take 81 mg by mouth every evening.) 30 tablet 12   atorvastatin (LIPITOR) 40 MG tablet Take 1 tablet (40 mg total) by mouth daily. 90 tablet 3   Blood Glucose Monitoring Suppl (BLOOD GLUCOSE MONITOR SYSTEM) w/Device KIT 1 each by Does not apply route daily. Use as directed to check blood sugar once daily 1 each 0   diclofenac sodium (VOLTAREN) 1 % GEL APPLY 2 G TOPICALLY 3 (THREE) TIMES DAILY AS NEEDED (ANTI INFLAMMATORY). 100 g 1   Glucose Blood (BLOOD GLUCOSE TEST  STRIPS) STRP 1 each by In Vitro route daily. Use as directed to check blood sugar once daily 100 each 0   ibuprofen (ADVIL,MOTRIN) 200 MG tablet Take 200-600 mg by mouth every 8 (eight) hours as needed for mild pain (severe back pain.).     Lancets MISC 1 each by Does not apply route daily. Use as directed to check blood sugar daily 100 each 0   losartan (COZAAR) 50 MG tablet Take 1 tablet (50 mg total) by mouth daily. 90 tablet 3   metFORMIN (GLUCOPHAGE) 1000 MG tablet Take 1 tablet (1,000 mg total) by mouth 2 (two) times daily with a meal. 180 tablet 3   methocarbamol (ROBAXIN) 500 MG tablet Take 1 tablet (500 mg total) by mouth 2 (two) times daily as needed for muscle spasms. 20 tablet 0   gabapentin (NEURONTIN) 300 MG capsule Take 1 capsule (300 mg total) by mouth 3 (three) times daily. 270 capsule 3   nitroGLYCERIN (NITROSTAT) 0.4 MG SL tablet Place 1 tablet (0.4 mg total) under the tongue every 5 (five) minutes as needed for chest pain (do not take more than 3 doses.). 50 tablet 1   No facility-administered medications prior to visit.     Per HPI unless specifically indicated in ROS section below Review of Systems  Objective:  BP (!) 146/70   Pulse 70  Temp 97.8 F (36.6 C) (Temporal)   Ht _0  (1.727 m)   Wt 244 lb 4 oz (110.8 kg)   SpO2 95%   BMI 37.14 kg/m   Wt Readings from Last 3 Encounters:  09/06/21 244 lb 4 oz (110.8 kg)  08/17/21 241 lb 2 oz (109.4 kg)  07/21/21 241 lb 4 oz (109.4 kg)      Physical Exam Vitals and nursing note reviewed.  Constitutional:      Appearance: Normal appearance. He is obese. He is not ill-appearing.  HENT:     Mouth/Throat:     Mouth: Mucous membranes are moist.     Pharynx: Oropharynx is clear. No oropharyngeal exudate or posterior oropharyngeal erythema.  Eyes:     Extraocular Movements: Extraocular movements intact.     Pupils: Pupils are equal, round, and reactive to light.  Cardiovascular:     Rate and Rhythm: Normal rate and  regular rhythm.     Pulses: Normal pulses.     Heart sounds: Normal heart sounds. No murmur heard. Pulmonary:     Effort: Pulmonary effort is normal. No respiratory distress.     Breath sounds: Normal breath sounds. No wheezing, rhonchi or rales.  Abdominal:     General: Bowel sounds are increased. There is distension.     Palpations: Abdomen is soft. There is no mass.     Tenderness: There is abdominal tenderness in the suprapubic area and left lower quadrant. There is no guarding or rebound. Negative signs include Murphy's sign.     Hernia: No hernia is present.     Comments:  Mildly increased bowel sounds but not borborygmi Predominant LLQ discomfort Distended lower abdomen  Musculoskeletal:     Right lower leg: No edema.     Left lower leg: No edema.  Skin:    General: Skin is warm and dry.     Findings: No rash.  Neurological:     Mental Status: He is alert.  Psychiatric:        Mood and Affect: Mood normal.        Behavior: Behavior normal.       Results for orders placed or performed in visit on 08/17/21  CBC  Result Value Ref Range   WBC 8.7 4.0 - 10.5 K/uL   RBC 4.59 4.22 - 5.81 Mil/uL   Platelets 229.0 150.0 - 400.0 K/uL   Hemoglobin 15.6 13.0 - 17.0 g/dL   HCT 45.4 39.0 - 52.0 %   MCV 98.9 78.0 - 100.0 fl   MCHC 34.3 30.0 - 36.0 g/dL   RDW 12.0 11.5 - 15.5 %  Comprehensive metabolic panel  Result Value Ref Range   Sodium 139 135 - 145 mEq/L   Potassium 4.4 3.5 - 5.1 mEq/L   Chloride 101 96 - 112 mEq/L   CO2 32 19 - 32 mEq/L   Glucose, Bld 123 (H) 70 - 99 mg/dL   BUN 15 6 - 23 mg/dL   Creatinine, Ser 0.79 0.40 - 1.50 mg/dL   Total Bilirubin 0.7 0.2 - 1.2 mg/dL   Alkaline Phosphatase 59 39 - 117 U/L   AST 46 (H) 0 - 37 U/L   ALT 47 0 - 53 U/L   Total Protein 7.1 6.0 - 8.3 g/dL   Albumin 4.4 3.5 - 5.2 g/dL   GFR 88.26 >60.00 mL/min   Calcium 9.6 8.4 - 10.5 mg/dL  Sedimentation rate  Result Value Ref Range   Sed Rate 5 0 - 20  mm/hr    Assessment &  Plan:   Problem List Items Addressed This Visit     Type 2 diabetes mellitus with other specified complication ([place])    Lab Results  Component Value Date   HGBA1C 6.6 (H) 07/14/2021  only on metformin.       Lower abdominal pain - Primary    2 days of lower abdominal pain associated with bowel movement changes, bloating, nausea/vomiting, known diverticulosis on prior colonoscopy, suspect diverticulitis vs acute gastroenteritis.  Check 2 view abd to evaluate abdominal distension, r/o obstruction.  Check labwork today. Discussed bowel rest, WASP augmentin course sent to pharmacy with instructions when to start.  Update if not improving as expected.      Relevant Orders   Comprehensive metabolic panel   CBC with Differential/Platelet   Lipase   DG Abd 2 Views     Meds ordered this encounter  Medications   gabapentin (NEURONTIN) 300 MG capsule    Sig: Take 1 capsule (300 mg total) by mouth 3 (three) times daily.    Dispense:  270 capsule    Refill:  3   amoxicillin-clavulanate (AUGMENTIN) 875-125 MG tablet    Sig: Take 1 tablet by mouth 2 (two) times daily for 10 days.    Dispense:  20 tablet    Refill:  0   Orders Placed This Encounter  Procedures   DG Abd 2 Views    Standing Status:   Future    Number of Occurrences:   1    Standing Expiration Date:   09/07/2022    Order Specific Question:   Reason for Exam (SYMPTOM  OR DIAGNOSIS REQUIRED)    Answer:   lower abd pain    Order Specific Question:   Preferred imaging location?    Answer:   [facility]   Comprehensive metabolic panel   CBC with Differential/Platelet   Lipase     Patient instructions: Call [facility] to make sure you have all medicines you need as I refilled all for a year in 06/2021.  Labs today  Abdominal xray today Recommend bowel rest - clear liquid diet for next 1-2 days. Gingerale, chicken broth, jello, gatorade. If no better with this, start antibiotic sent to pharmacy. If any worsening or  not improving with treatment, let us know for CT scan.   Follow up plan: No follow-ups on file.  Ria Bush, MD

## 2021-09-06 NOTE — Patient Instructions (Addendum)
Call [facility] to make sure you have all medicines you need as I refilled all for a year in 06/2021. I have gone ahead and refilled gabapentin for you today.  Labs today  Abdominal xray today Possible diverticulitis  Recommend bowel rest - clear liquid diet for next 1-2 days. Gingerale, chicken broth, jello, gatorade. If no better with this, start antibiotic sent to pharmacy. If any worsening or not improving with treatment, let us know for CT scan.   Diverticulitis  Diverticulitis is when small pouches in your colon (large intestine) get infected or swollen. This causes pain in the belly (abdomen) and watery poop (diarrhea). These pouches are called diverticula. The pouches form in people who have a condition called diverticulosis. What are the causes? This condition may be caused by poop (stool) that gets trapped in the pouches in your colon. The poop lets germs (bacteria) grow in the pouches. This causes the infection. What increases the risk? You are more likely to get this condition if you have small pouches in your colon. The risk is higher if: You are overweight or very overweight (obese). You do not exercise enough. You drink alcohol. You smoke or use products with tobacco in them. You eat a diet that has a lot of red meat such as beef, pork, or lamb. You eat a diet that does not have enough fiber in it. You are older than 73 years of age. What are the signs or symptoms? Pain in the belly. Pain is often on the left side, but it may be in other areas. Fever and feeling cold. Feeling like you may vomit. Vomiting. Having cramps. Feeling full. Changes to how often you poop. Blood in your poop. How is this treated? Most cases are treated at home by: Taking over-the-counter pain medicines. Following a clear liquid diet. Taking antibiotic medicines. Resting. Very bad cases may need to be treated at a hospital. This may include: Not eating or drinking. Taking prescription pain  medicine. Getting antibiotic medicines through an IV tube. Getting fluid and food through an IV tube. Having surgery. When you are feeling better, your doctor may tell you to have a test to check your colon (colonoscopy). Follow these instructions at home: Medicines Take over-the-counter and prescription medicines only as told by your doctor. These include: Antibiotics. Pain medicines. Fiber pills. Probiotics. Stool softeners. If you were prescribed an antibiotic medicine, take it as told by your doctor. Do not stop taking the antibiotic even if you start to feel better. Ask your doctor if the medicine prescribed to you requires you to avoid driving or using machinery. Eating and drinking  Follow a diet as told by your doctor. When you feel better, your doctor may tell you to change your diet. You may need to eat a lot of fiber. Fiber makes it easier to poop (have a bowel movement). Foods with fiber include: Berries. Beans. Lentils. Green vegetables. Avoid eating red meat. General instructions Do not use any products that contain nicotine or tobacco, such as cigarettes, e-cigarettes, and chewing tobacco. If you need help quitting, ask your doctor. Exercise 3 or more times a week. Try to get 30 minutes each time. Exercise enough to sweat and make your heart beat faster. Keep all follow-up visits as told by your doctor. This is important. Contact a doctor if: Your pain does not get better. You are not pooping like normal. Get help right away if: Your pain gets worse. Your symptoms do not get better. Your symptoms  get worse very fast. You have a fever. You vomit more than one time. You have poop that is: Bloody. Black. Tarry. Summary This condition happens when small pouches in your colon get infected or swollen. Take medicines only as told by your doctor. Follow a diet as told by your doctor. Keep all follow-up visits as told by your doctor. This is important. This  information is not intended to replace advice given to you by your health care provider. Make sure you discuss any questions you have with your health care provider. Document Revised: 10/27/2018 Document Reviewed: 10/27/2018 Elsevier Patient Education  [employer].

## 2021-09-06 NOTE — Assessment & Plan Note (Addendum)
2 days of lower abdominal pain associated with bowel movement changes, bloating, nausea/vomiting, known diverticulosis on prior colonoscopy, suspect diverticulitis vs acute gastroenteritis.  Check 2 view abd to evaluate abdominal distension, r/o obstruction.  Check labwork today. Discussed bowel rest, WASP augmentin course sent to pharmacy with instructions when to start.  Update if not improving as expected.

## 2021-09-06 NOTE — Assessment & Plan Note (Signed)
Lab Results  Component Value Date   HGBA1C 6.6 (H) 07/14/2021  only on metformin.

## 2021-09-07 ENCOUNTER — Telehealth: Payer: Self-pay

## 2021-09-07 NOTE — Telephone Encounter (Signed)
Patient called in wanting to update Dr.G. Dominica Severin said his stomach is still swollen, had a bowel movement without pain and is feeling a little bit better than yesterday.

## 2021-09-08 NOTE — Telephone Encounter (Signed)
See lab result note.

## 2021-09-08 NOTE — Telephone Encounter (Signed)
Each day he is feeling better  Able to eat toast this morning, and even had coffee this morning  Patient is feeling 100% better

## 2021-09-15 ENCOUNTER — Telehealth: Payer: Self-pay

## 2021-09-15 NOTE — Telephone Encounter (Signed)
We did not call him. Plz f/u to see what he needs.

## 2021-09-15 NOTE — Telephone Encounter (Signed)
[place] Night - Client Nonclinical Telephone Record  [facility] Client [facility] Night - Client Client Site [place] Provider Ria Bush - MD Contact Type Call Who Is Calling Patient / Member / Family / Caregiver Caller Name Casey Golden Phone Number [phone] Call Type Message Only Information Provided Reason for Call Returning a Call from the Office Initial Casey Golden is returning a call to Dr. Emi Holes, MD Additional Comment Caller is returning a call to Dr. Ria Bush, MD. Disp. Time Disposition Final User 09/14/2021 5:44:30 PM General Information Provided Yes King-Hussey, Berdi Call Closed By: [name] Transaction Date/Time: 09/14/2021 5:41:06 PM (ET  Sending note to Dr Danise Mina and Lattie Haw CMA.

## 2021-09-18 NOTE — Telephone Encounter (Signed)
Spoke with pt notifying him we don't see any phn notes or results notes showing anyone from our office tried to contact pt.  Pt offers his apologies and realized call came from one of his other docs.

## 2021-09-19 DIAGNOSIS — L570 Actinic keratosis: Secondary | ICD-10-CM | POA: Diagnosis not present

## 2021-09-19 DIAGNOSIS — D229 Melanocytic nevi, unspecified: Secondary | ICD-10-CM | POA: Diagnosis not present

## 2021-09-19 DIAGNOSIS — C4499 Other specified malignant neoplasm of skin, unspecified: Secondary | ICD-10-CM | POA: Diagnosis not present

## 2021-09-19 DIAGNOSIS — L821 Other seborrheic keratosis: Secondary | ICD-10-CM | POA: Diagnosis not present

## 2021-09-19 DIAGNOSIS — D1801 Hemangioma of skin and subcutaneous tissue: Secondary | ICD-10-CM | POA: Diagnosis not present

## 2021-09-19 DIAGNOSIS — L814 Other melanin hyperpigmentation: Secondary | ICD-10-CM | POA: Diagnosis not present

## 2021-09-19 DIAGNOSIS — Z85828 Personal history of other malignant neoplasm of skin: Secondary | ICD-10-CM | POA: Diagnosis not present

## 2021-10-03 LAB — HM DIABETES EYE EXAM

## 2021-10-04 ENCOUNTER — Encounter: Payer: Self-pay | Admitting: Family Medicine

## 2021-10-26 ENCOUNTER — Telehealth: Payer: Self-pay

## 2021-10-26 NOTE — Telephone Encounter (Signed)
[place] Night - Client Nonclinical Telephone Record  [facility] Client [facility] Night - Client Client Site [place] Provider Ria Bush - MD Contact Type Call Who Is Calling Patient / Member / Family / Caregiver Caller Name Vimal Derego Caller Phone Number [phone] Patient Name Casey Golden Patient DOB [birth year] Call Type Message Only Information Provided Reason for Call Request to Schedule Office Appointment Initial Comment Caller states he is needing to schedule an appt to for the A1 sugar diabetes test. Patient request to speak to RN No Disp. Time Disposition Final User 10/25/2021 5:02:08 PM General Information Provided Yes Jaynie Crumble Call Closed By: Jaynie Crumble Transaction Date/Time: 10/25/2021 4:59:36 PM (ET

## 2021-11-28 ENCOUNTER — Emergency Department: Payer: Medicare Other

## 2021-11-28 ENCOUNTER — Emergency Department
Admission: EM | Admit: 2021-11-28 | Discharge: 2021-11-28 | Disposition: A | Discharge status: Home / Self Care | Payer: Medicare Other | Attending: Emergency Medicine | Admitting: Emergency Medicine

## 2021-11-28 ENCOUNTER — Telehealth: Payer: Self-pay

## 2021-11-28 ENCOUNTER — Other Ambulatory Visit: Payer: Self-pay

## 2021-11-28 DIAGNOSIS — R519 Headache, unspecified: Secondary | ICD-10-CM | POA: Insufficient documentation

## 2021-11-28 DIAGNOSIS — I10 Essential (primary) hypertension: Secondary | ICD-10-CM | POA: Insufficient documentation

## 2021-11-28 DIAGNOSIS — I2510 Atherosclerotic heart disease of native coronary artery without angina pectoris: Secondary | ICD-10-CM | POA: Diagnosis not present

## 2021-11-28 DIAGNOSIS — K921 Melena: Secondary | ICD-10-CM | POA: Insufficient documentation

## 2021-11-28 DIAGNOSIS — R195 Other fecal abnormalities: Secondary | ICD-10-CM

## 2021-11-28 LAB — CBC
HCT: 46.1 % (ref 39.0–52.0)
Hemoglobin: 15.9 g/dL (ref 13.0–17.0)
MCH: 33.2 pg (ref 26.0–34.0)
MCHC: 34.5 g/dL (ref 30.0–36.0)
MCV: 96.2 fL (ref 80.0–100.0)
Platelets: 268 10*3/uL (ref 150–400)
RBC: 4.79 MIL/uL (ref 4.22–5.81)
RDW: 11.8 % (ref 11.5–15.5)
WBC: 7.6 10*3/uL (ref 4.0–10.5)
nRBC: 0 % (ref 0.0–0.2)

## 2021-11-28 LAB — COMPREHENSIVE METABOLIC PANEL
ALT: 41 U/L (ref 0–44)
AST: 43 U/L — ABNORMAL HIGH (ref 15–41)
Albumin: 4.6 g/dL (ref 3.5–5.0)
Alkaline Phosphatase: 56 U/L (ref 38–126)
Anion gap: 6 (ref 5–15)
BUN: 18 mg/dL (ref 8–23)
CO2: 28 mmol/L (ref 22–32)
Calcium: 9.6 mg/dL (ref 8.9–10.3)
Chloride: 104 mmol/L (ref 98–111)
Creatinine, Ser: 0.83 mg/dL (ref 0.61–1.24)
GFR, Estimated: >60 mL/min (ref >60)
Glucose, Bld: 175 mg/dL — ABNORMAL HIGH (ref 70–99)
Potassium: 4 mmol/L (ref 3.5–5.1)
Sodium: 138 mmol/L (ref 135–145)
Total Bilirubin: 1 mg/dL (ref 0.3–1.2)
Total Protein: 8.1 g/dL (ref 6.5–8.1)

## 2021-11-28 LAB — TYPE AND SCREEN
ABO/RH(D): O POS
Antibody Screen: NEGATIVE

## 2021-11-28 NOTE — ED Provider Triage Note (Signed)
Emergency Medicine Provider Triage Evaluation Note  Casey Golden , a 71 y.o. male  was evaluated in triage.  Pt complains of daily right side headache ongoing for the past several months as well as dark tarry stool intermittent for the past few months with last episode last night. Sent to ER by PCP for further eval. No abdominal pain.  Physical Exam  BP (!) 169/65 (BP Location: Left Arm)   Pulse 72   Temp 97.8 F (36.6 C) (Oral)   Resp 18   Ht '5\' 8"'$  (1.727 m)   SpO2 95%   BMI 37.14 kg/m  Gen:   Awake, no distress   Resp:  Normal effort  MSK:   Moves extremities without difficulty  Other:    Medical Decision Making  Medically screening exam initiated at 11:14 AM.  Appropriate orders placed.  Casey Golden was informed that the remainder of the evaluation will be completed by another provider, this initial triage assessment does not replace that evaluation, and the importance of remaining in the ED until their evaluation is complete.    Victorino Dike, FNP 11/28/21 1116

## 2021-11-28 NOTE — Discharge Instructions (Signed)
Your CT scan and lab work were reassuring.  Please follow-up with your PCP regarding the month of dark stools.  I have referred you to neurology if continued headaches.

## 2021-11-28 NOTE — ED Triage Notes (Addendum)
Pt to ED via POV from home. Pt reports constant HA x45monththat has been intermittent for a year. Pt also states black tarry stools intermittent x1-2 months. Pt denies blurry vision, CP or SOB.

## 2021-11-28 NOTE — Telephone Encounter (Signed)
Pt called office with pressure feeling on rt side of head; pt said "does not feel right". Pt also said for 3 - 4 days has black tarry stools (not taking Pepto Bismol) pt has not seen blood in stools. Pt has slight lower mid dull abd pain that is mostly constant. Pt said some weakness in back but pt is not sure if weakness in extremities.pt has noticed last few days has been more agitated and easier to get aggravated than usual. No slurred speech. No available appts at [facility] or [facility]. Pt is going to [facility] ED for eval and any needed testing. Pt ask that I call and let them know pt is coming. I called [facility] ED and spoke with Bill in triage and gave above info. Sending note to Dr Darnell Level and Lattie Haw CMA.

## 2021-11-28 NOTE — Telephone Encounter (Signed)
Will await ER eval

## 2021-11-28 NOTE — ED Provider Notes (Signed)
[facility] Provider Note    Event Date/Time   First MD Initiated Contact with Patient 11/28/21 1227     (approximate)   History   Headache and dark stools   HPI  Casey Golden is a 71 y.o. male with a history of CAD, hypertension who presents with 2 complaints.  Patient's primary complaint is headache which he has had intermittently over the last week, primarily on the right side of the head.  No neurodeficits.  No vomiting.  No weakness, no fever.  Additionally he notes that he has had dark stool over the last month.  He was bringing a stool sample to his physician but was routed to the emergency department instead.  Denies abdominal pain.  Not on blood thinners.     Physical Exam   Triage Vital Signs: ED Triage Vitals  Enc Vitals Group     BP 11/28/21 1053 (!) 169/65     Pulse Rate 11/28/21 1053 72     Resp 11/28/21 1053 18     Temp 11/28/21 1053 97.8 F (36.6 C)     Temp Source 11/28/21 1053 Oral     SpO2 11/28/21 1053 95 %     Weight 11/28/21 1312 110.8 kg (244 lb 4.3 oz)     Height 11/28/21 1054 1.727 m ('5\' 8"'$ )     Head Circumference --      Peak Flow --      Pain Score 11/28/21 1111 0     Pain Loc --      Pain Edu? --      Excl. in [place]? --     Most recent vital signs: Vitals:   11/28/21 1053  BP: (!) 169/65  Pulse: 72  Resp: 18  Temp: 97.8 F (36.6 C)  SpO2: 95%     General: Awake, no distress.  CV:  Good peripheral perfusion.  Resp:  Normal effort.  Abd:  No distention.  Soft, nontender Other:  Nerves II through XII are normal   ED Results / Procedures / Treatments   Labs (all labs ordered are listed, but only abnormal results are displayed) Labs Reviewed  COMPREHENSIVE METABOLIC PANEL - Abnormal; Notable for the following components:      Result Value   Glucose, Bld 175 (*)    AST 43 (*)    All other components within normal limits  CBC  POC OCCULT BLOOD, ED  TYPE AND SCREEN     EKG     RADIOLOGY CT  head viewed interpreted by me, no acute abnormality    PROCEDURES:  Critical Care performed:   Procedures   MEDICATIONS ORDERED IN ED: Medications - No data to display   IMPRESSION / MDM / [place] / ED COURSE  I reviewed the triage vital signs and the nursing notes. Patient's presentation is most consistent with acute presentation with potential threat to life or bodily function.  Patient presents with headache and dark stools as detailed above.  Differential for headache includes nonspecific headache, migraine, tension headache, less likely ICH, mass.  No red flag symptoms, no neurodeficits.  Overall well-appearing, intermittent headache.  Dark stools for 1 month, no abdominal pain, not on blood thinners.  Hemoglobin here is improved from prior, not consistent with significant GI bleed.  Patient is having this worked up appropriately as an outpatient, no indication for admission at this time.  CT scan is reassuring, patient is asymptomatic here in the emergency department, appropriate for discharge  with outpatient follow-up with neurology and PCP.  Return precautions discussed.      FINAL CLINICAL IMPRESSION(S) / ED DIAGNOSES   Final diagnoses:  Nonintractable headache, unspecified chronicity pattern, unspecified headache type  Dark stools     Rx / DC Orders   ED Discharge Orders     None        Note:  This document was prepared using Dragon voice recognition software and may include unintentional dictation errors.   Lavonia Drafts, MD 11/28/21 1359

## 2021-12-04 ENCOUNTER — Telehealth: Payer: Self-pay

## 2021-12-04 NOTE — Progress Notes (Addendum)
    Chronic Care Management Pharmacy Assistant   Name: Casey Golden  MRN: [id number] DOB: 1950-11-05  Reason for Encounter: Non-CCM (Hosptial Follow Up)  Medications: Outpatient Encounter Medications as of 12/04/2021  Medication Sig   aspirin 81 MG EC tablet Take 1 tablet (81 mg total) by mouth daily. (Patient taking differently: Take 81 mg by mouth every evening.)   atorvastatin (LIPITOR) 40 MG tablet Take 1 tablet (40 mg total) by mouth daily.   Blood Glucose Monitoring Suppl (BLOOD GLUCOSE MONITOR SYSTEM) w/Device KIT 1 each by Does not apply route daily. Use as directed to check blood sugar once daily   diclofenac sodium (VOLTAREN) 1 % GEL APPLY 2 G TOPICALLY 3 (THREE) TIMES DAILY AS NEEDED (ANTI INFLAMMATORY).   gabapentin (NEURONTIN) 300 MG capsule Take 1 capsule (300 mg total) by mouth 3 (three) times daily.   Glucose Blood (BLOOD GLUCOSE TEST STRIPS) STRP 1 each by In Vitro route daily. Use as directed to check blood sugar once daily   ibuprofen (ADVIL,MOTRIN) 200 MG tablet Take 200-600 mg by mouth every 8 (eight) hours as needed for mild pain (severe back pain.).   Lancets MISC 1 each by Does not apply route daily. Use as directed to check blood sugar daily   losartan (COZAAR) 50 MG tablet Take 1 tablet (50 mg total) by mouth daily.   metFORMIN (GLUCOPHAGE) 1000 MG tablet Take 1 tablet (1,000 mg total) by mouth 2 (two) times daily with a meal.   methocarbamol (ROBAXIN) 500 MG tablet Take 1 tablet (500 mg total) by mouth 2 (two) times daily as needed for muscle spasms.   nitroGLYCERIN (NITROSTAT) 0.4 MG SL tablet Place 1 tablet (0.4 mg total) under the tongue every 5 (five) minutes as needed for chest pain (do not take more than 3 doses.).   No facility-administered encounter medications on file as of 12/04/2021.    Reviewed hospital notes for details of recent visit. Has patient been contacted by Transitions of Care team? No Has patient seen PCP/specialist for hospital follow up  (summarize OV if yes): No  Admitted to the ED on 11/28/2021. Discharge date was 11/28/2021.  Discharged from [facility]   Discharge diagnosis (Principal Problem): Nonintractable headache  Patient was discharged to Home  Brief summary of hospital course: Patient presents with headache and dark stools as detailed above.   Differential for headache includes nonspecific headache, migraine, tension headache, less likely ICH, mass.  No red flag symptoms, no neurodeficits.  Overall well-appearing, intermittent headache.   Dark stools for 1 month, no abdominal pain, not on blood thinners.   Hemoglobin here is improved from prior, not consistent with significant GI bleed.  Patient is having this worked up appropriately as an outpatient, no indication for admission at this time.   CT scan is reassuring, patient is asymptomatic here in the emergency department, appropriate for discharge with outpatient follow-up with neurology and PCP.  Return precautions discussed.  Medications that remain the same after Hospital Discharge:??  -All other medications will remain the same.    Next CCM appt: Non-CCM  Other upcoming appts: PCP appointment on 01/08/2022  Charlene Brooke, PharmD notified and will determine if action is needed.  Charlene Brooke, CPP notified  Marijean Niemann, [place] Clinical Pharmacy Assistant [phone]   Pharmacist addendum: Reviewed hospital and chart notes. No further action needed.  Charlene Brooke, PharmD, BCACP 12/26/21 12:53 PM

## 2021-12-12 ENCOUNTER — Telehealth: Payer: Self-pay

## 2021-12-12 NOTE — Telephone Encounter (Signed)
     Patient  visit on [place]  at 10/31  Have you been able to follow up with your primary care physician? Yes   The patient was or was not able to obtain any needed medicine or equipment. Yes   Are there diet recommendations that you are having difficulty following? Na   Patient expresses understanding of discharge instructions and education provided has no other needs at this time.  Yes      [place], [facility], Care Management  [phone] [address], [place], [place] [address] Phone: [phone] Email: [email or web address]

## 2021-12-18 IMAGING — CT CT HEAD W/O CM
3 series · 15 of 47 positions shown, 18 images · non-contrast
Comparison: 09/10/2020

CLINICAL DATA: Mental status changes of unknown cause.  Lethargy.

EXAM:
CT HEAD WITHOUT CONTRAST
TECHNIQUE: Contiguous axial images were obtained from the base of the skull
through the vertex without intravenous contrast.

[Series 3: head wo · axial · 0.43mm/px · z∈[-109,+21]mm · 9 of 32 slices shown, 12 images]
[im 3/32  brain]
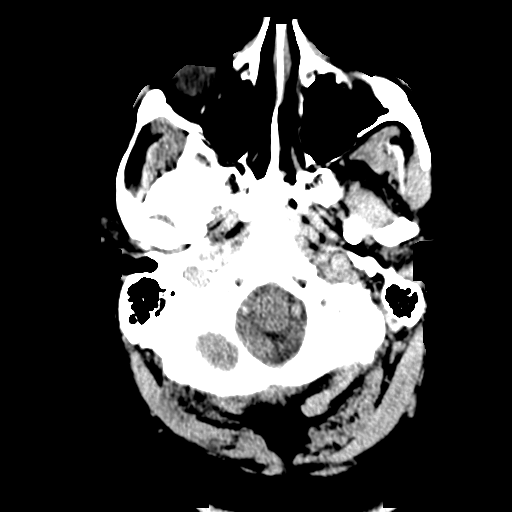
[im 3/32  bone]
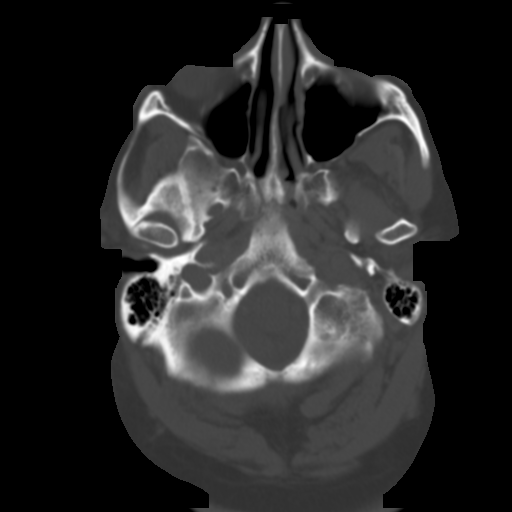
[im 6/32  brain]
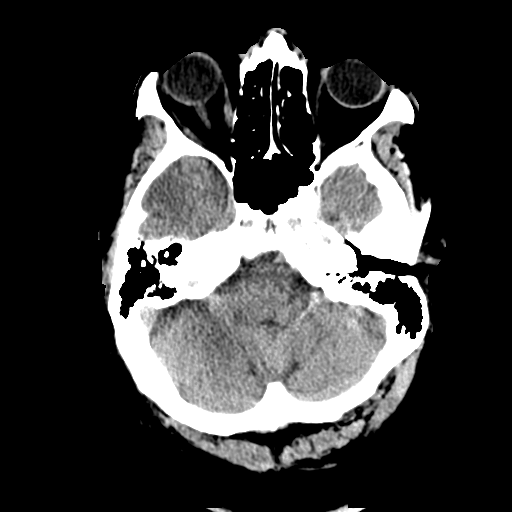
[im 9/32  brain]
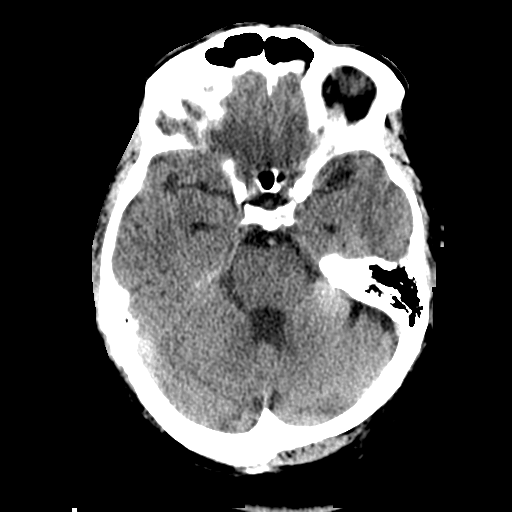
[im 12/32  brain]
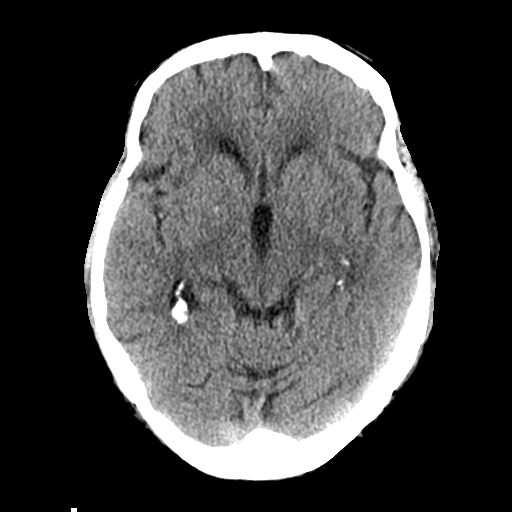
[im 17/32  brain]
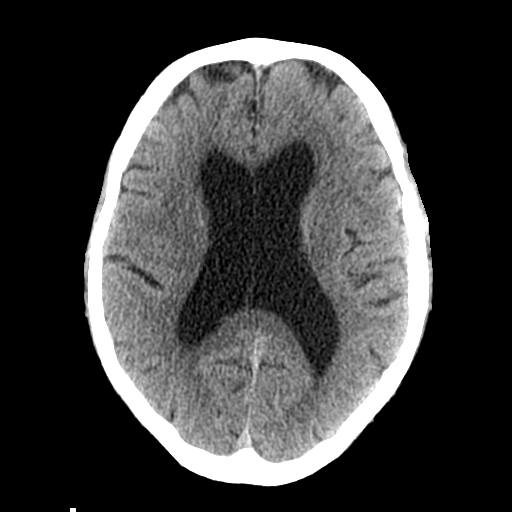
[im 17/32  bone]
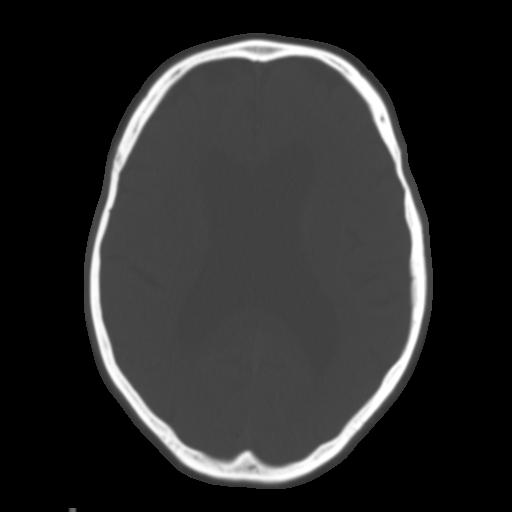
[im 20/32  brain]
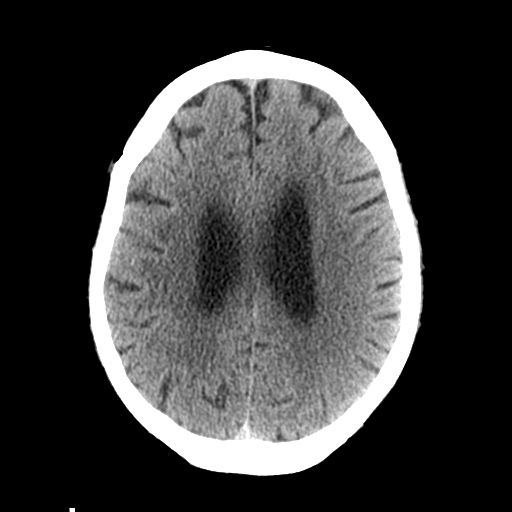
[im 23/32  brain]
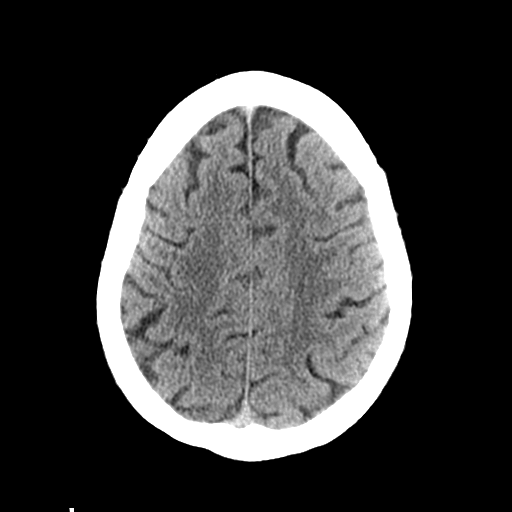
[im 26/32  brain]
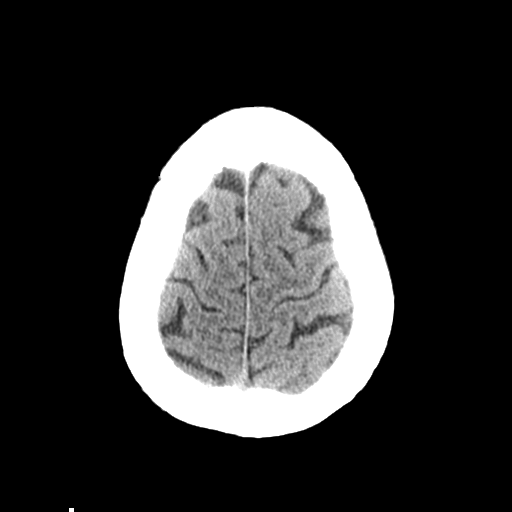
[im 29/32  brain]
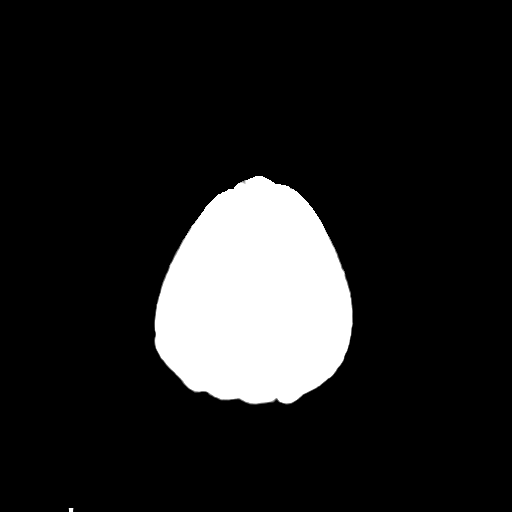
[im 29/32  bone]
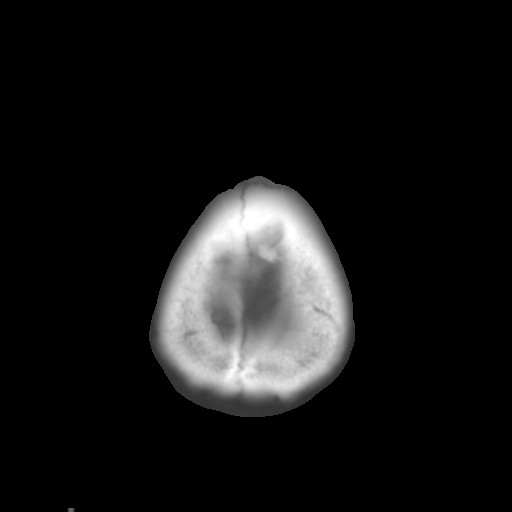

[Series 4: coronal soft tissue · coronal · 0.32mm/px · 3 of 67 slices shown]
[im 23/67  brain]
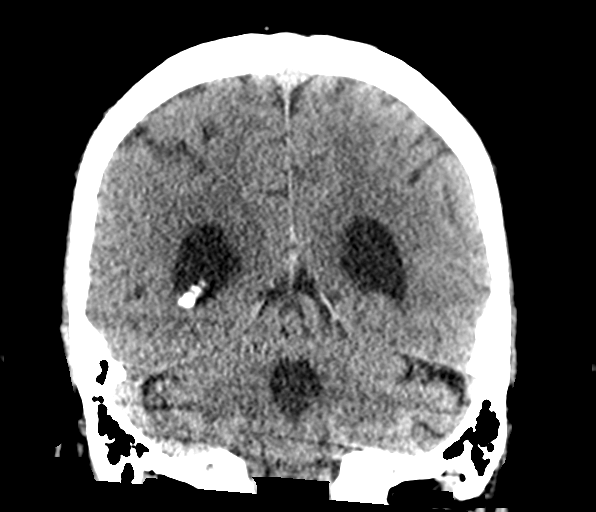
[im 30/67  brain]
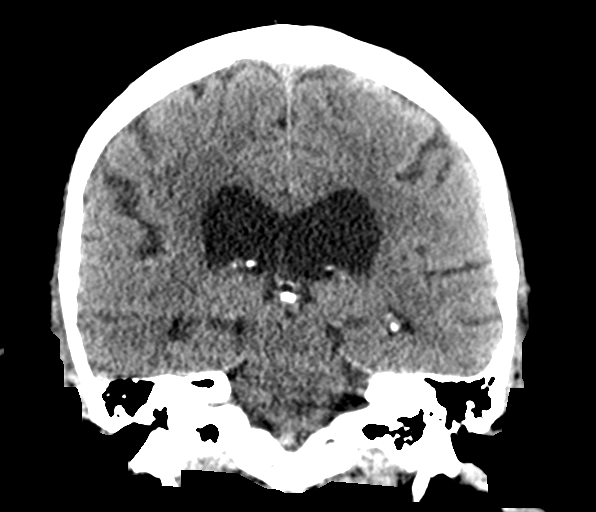
[im 37/67  brain]
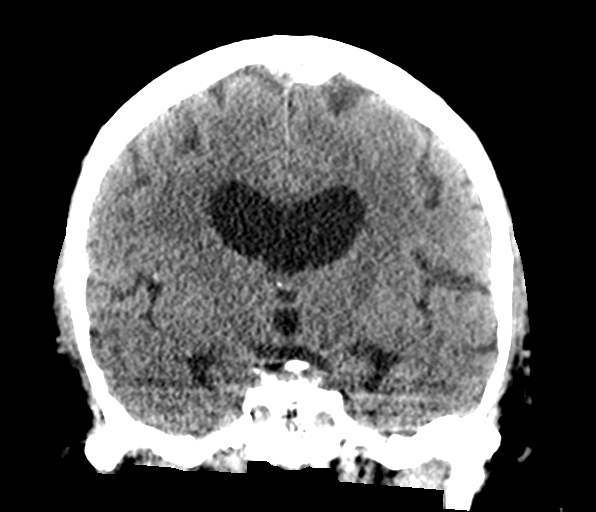

[Series 5: sagittal soft tissue · sagittal · 0.33mm/px · 3 of 55 slices shown]
[im 19/55  brain]
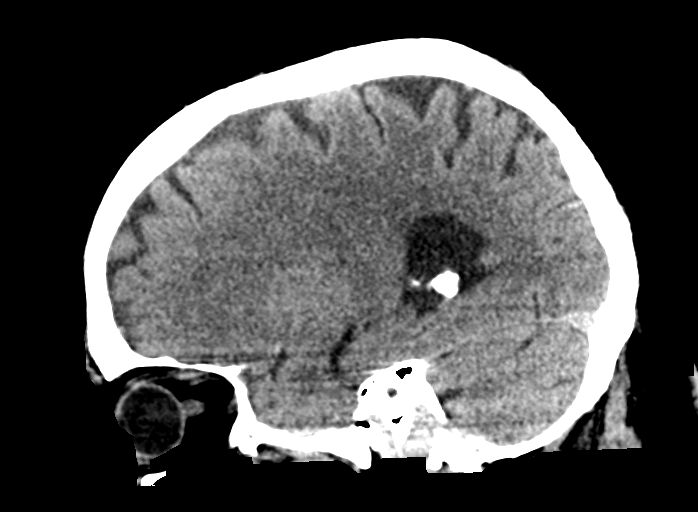
[im 28/55  brain]
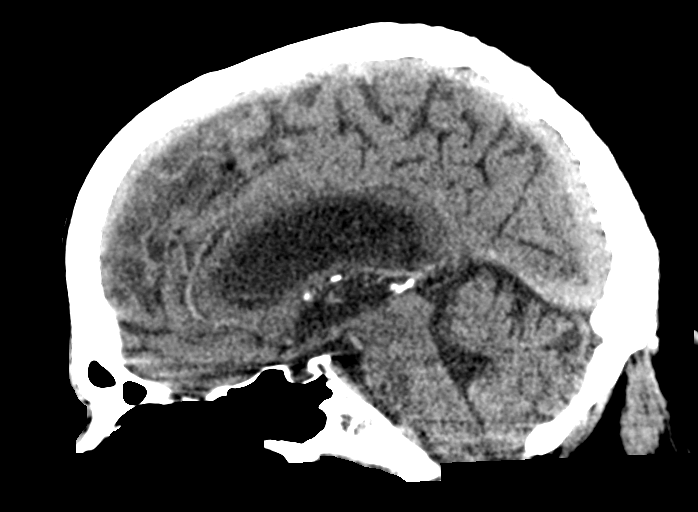
[im 37/55  brain]
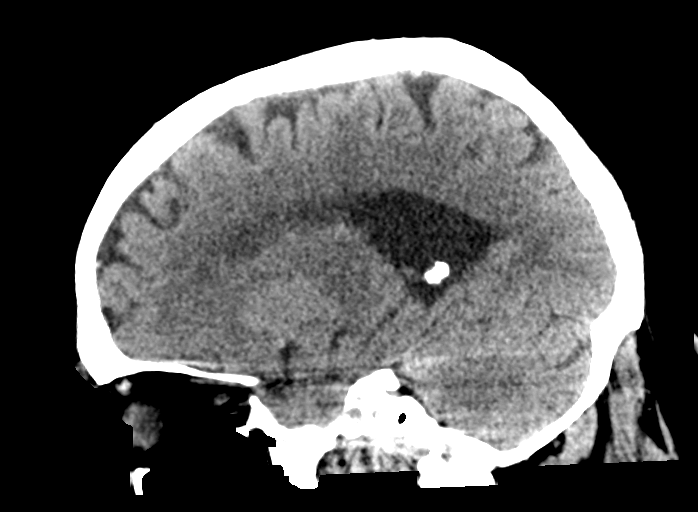

[15 of 47 positions shown; findings below may reference images not displayed]

FINDINGS: Brain: No acute finding. Mild chronic small-vessel ischemic change
of the white matter. Mild chronic ventriculomegaly felt most
consistent with central atrophy. No sign of mass, hemorrhage,
hydrocephalus or extra-axial collection.

Vascular: There is atherosclerotic calcification of the major
vessels at the base of the brain.

Skull: Normal

Sinuses/Orbits: Clear/normal

Other: None low
IMPRESSION: Cerebral Atrophy ([id number]). Mild chronic small-vessel ischemic
change of the white matter. Stable ventriculomegaly, felt most
likely secondary to central atrophy. However, normal pressure
hydrocephalus is not dogmatically excluded by the imaging in this
case.

## 2021-12-18 IMAGING — CR DG CHEST 2V
1 series · 3 of 3 positions shown · non-contrast
Comparison: Chest x-ray 09/12/2020, CT chest 11/04/2018

CLINICAL DATA: Chest x-ray

EXAM:
CHEST - 2 VIEW.  Patient is rotated on frontal view.

[Series 1: dg chest 2 view · 0.14mm/px · 3 of 3 slices shown]
[im 1/3]
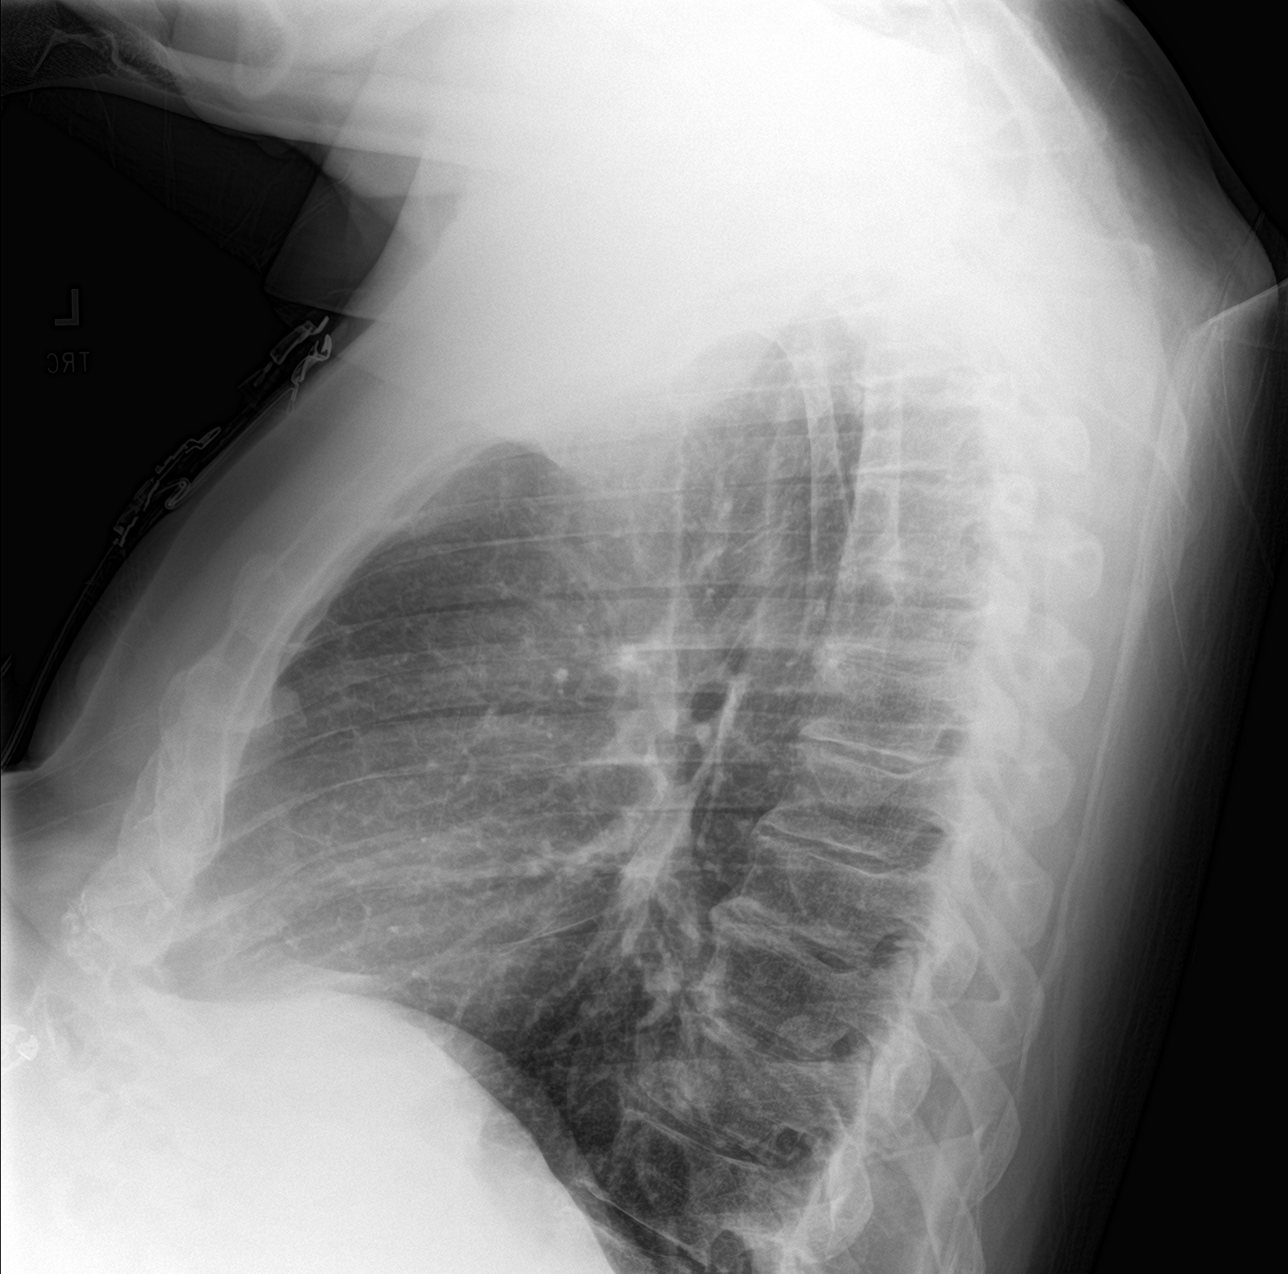
[im 2/3]
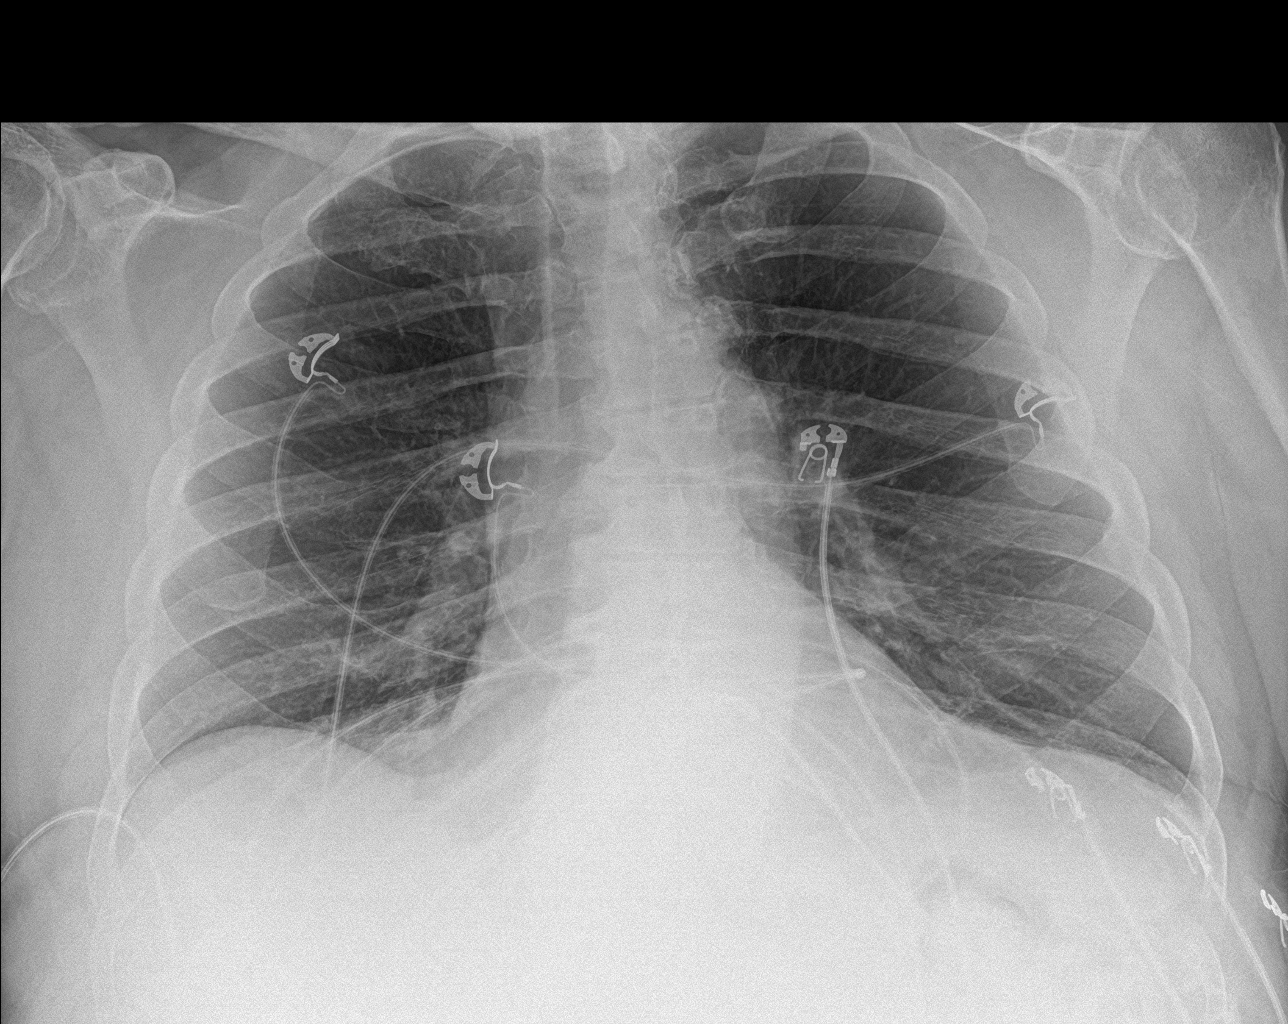
[im 3/3]
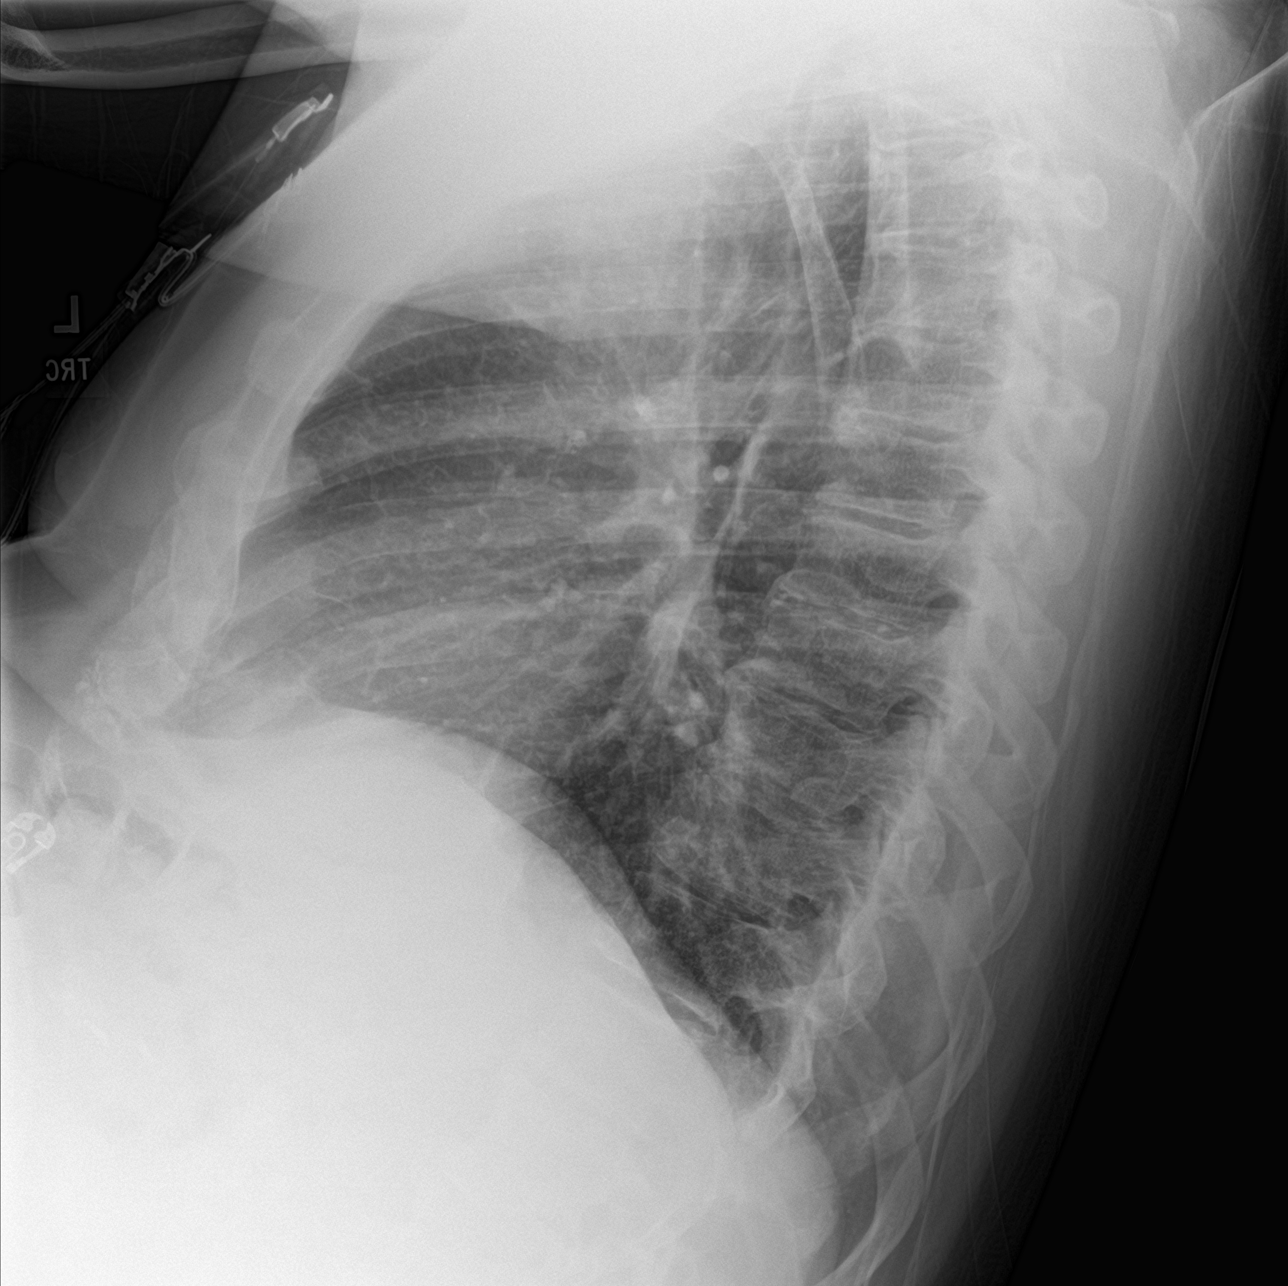

[3 of 3 positions shown; findings below may reference images not displayed]

FINDINGS: The heart and mediastinal contours are unchanged.

No focal consolidation. No pulmonary edema. No pleural effusion. No
pneumothorax.

No acute osseous abnormality.
IMPRESSION: No active cardiopulmonary disease.

## 2021-12-26 ENCOUNTER — Ambulatory Visit (INDEPENDENT_AMBULATORY_CARE_PROVIDER_SITE_OTHER): Payer: 59 | Admitting: Nurse Practitioner

## 2021-12-26 VITALS — BP 136/64 | HR 64 | Temp 97.8°F | Resp 12 | Wt 239.0 lb

## 2021-12-26 DIAGNOSIS — M545 Low back pain, unspecified: Secondary | ICD-10-CM | POA: Diagnosis not present

## 2021-12-26 DIAGNOSIS — M6289 Other specified disorders of muscle: Secondary | ICD-10-CM | POA: Diagnosis not present

## 2021-12-26 LAB — POC URINALSYSI DIPSTICK (AUTOMATED)
Bilirubin, UA: NEGATIVE
Blood, UA: NEGATIVE
Glucose, UA: NEGATIVE
Ketones, UA: NEGATIVE
Leukocytes, UA: NEGATIVE
Nitrite, UA: NEGATIVE
Protein, UA: NEGATIVE
Spec Grav, UA: 1.01 (ref 1.010–1.025)
Urobilinogen, UA: 0.2 E.U./dL
pH, UA: 6 (ref 5.0–8.0)

## 2021-12-26 MED ORDER — TIZANIDINE HCL 2 MG PO TABS: 2.0000 mg | ORAL_TABLET | Freq: Two times a day (BID) | ORAL | 0 refills | Status: DC | PRN

## 2021-12-26 NOTE — Assessment & Plan Note (Signed)
History of spinal surgeries.  No injury.  Will defer imaging at this point we will try muscle relaxant to see if this is beneficial.  If no improvement he will reach out we can always consider imaging at that point.  No red flags on exam

## 2021-12-26 NOTE — Progress Notes (Signed)
Acute Office Visit  Subjective:     Patient ID: Casey Golden, male    DOB: [birth year], 71 y.o.   MRN: [id number]  Chief Complaint  Patient presents with   Back Pain    Started around 12/22/21, no injury or trauma to the area. Got a little better for a couple of days and now more severe again. Pain in the left lower back area. Sharp pain off and on. Gets better after sitting and relaxing for a while. Pain does not radiate.     Patient is in today for Back pain   States that he has back pain for awhile but 4 days ago it  increased and effected his gait. States that he is "walking like [employer]". States that on thanksgiving he did not feel well that day. States that it feels like a knife is being stuck in it. States that he still works but does not lift any thing heavy at work.  States that it depends if it hurts when he sits. States that it will come and go.  Has tried some advil that offered minimal relief.   Review of Systems  Constitutional:  Negative for chills and fever.  Respiratory:  Negative for shortness of breath.   Cardiovascular:  Negative for chest pain.  Gastrointestinal:  Positive for constipation.       Bm yesterday that was smaller than normal   Genitourinary:  Negative for dysuria and hematuria.       B & B negative  Musculoskeletal:  Positive for back pain.  Neurological:  Negative for tingling and weakness.        Objective:    BP 136/64   Pulse 64   Temp 97.8 F (36.6 C) (Oral)   Resp 12   Wt 239 lb (108.4 kg)   SpO2 97%   BMI 36.34 kg/m  BP Readings from Last 3 Encounters:  12/26/21 136/64  11/28/21 (!) 169/65  09/06/21 (!) 146/70   Wt Readings from Last 3 Encounters:  12/26/21 239 lb (108.4 kg)  11/28/21 244 lb 4.3 oz (110.8 kg)  09/06/21 244 lb 4 oz (110.8 kg)      Physical Exam Vitals and nursing note reviewed.  Constitutional:      Appearance: Normal appearance.  Cardiovascular:     Rate and Rhythm: Normal rate and  regular rhythm.     Heart sounds: Normal heart sounds.  Pulmonary:     Effort: Pulmonary effort is normal.     Breath sounds: Normal breath sounds.  Musculoskeletal:        General: No signs of injury.     Lumbar back: No tenderness or bony tenderness. Negative right straight leg raise test and negative left straight leg raise test.       Back:     Comments: Flexion and oblique ROM makes it worse  Left-sided paraspinal muscles tighter than right  Neurological:     General: No focal deficit present.     Mental Status: He is alert.     Deep Tendon Reflexes:     Reflex Scores:      Patellar reflexes are 1+ on the right side and 1+ on the left side.    Comments: Bilateral lower extremity strength 5/5     Results for orders placed or performed in visit on 12/26/21  POCT Urinalysis Dipstick (Automated)  Result Value Ref Range   Color, UA yellow    Clarity, UA clear    Glucose, UA  Negative Negative   Bilirubin, UA negative    Ketones, UA negative    Spec Grav, UA 1.010 1.010 - 1.025   Blood, UA negative    pH, UA 6.0 5.0 - 8.0   Protein, UA Negative Negative   Urobilinogen, UA 0.2 0.2 or 1.0 E.U./dL   Nitrite, UA negative    Leukocytes, UA Negative Negative        Assessment & Plan:   Problem List Items Addressed This Visit       Other   Muscle tightness    Muscle tightness on exam.  Tizanidine 2 mg twice daily as needed.  Sedation and dizzy precautions reviewed with patient      Relevant Medications   tiZANidine (ZANAFLEX) 2 MG tablet   Acute left-sided low back pain without sciatica - Primary    History of spinal surgeries.  No injury.  Will defer imaging at this point we will try muscle relaxant to see if this is beneficial.  If no improvement he will reach out we can always consider imaging at that point.  No red flags on exam      Relevant Medications   tiZANidine (ZANAFLEX) 2 MG tablet   Other Relevant Orders   POCT Urinalysis Dipstick (Automated)  (Completed)    Meds ordered this encounter  Medications   tiZANidine (ZANAFLEX) 2 MG tablet    Sig: Take 1 tablet (2 mg total) by mouth 2 (two) times daily as needed for muscle spasms.    Dispense:  15 tablet    Refill:  0    Order Specific Question:   Supervising Provider    Answer:   TOWER, MARNE A [[id number]    Return if symptoms worsen or fail to improve.  Romilda Garret, NP

## 2021-12-26 NOTE — Patient Instructions (Signed)
Nice to see you today I have sent in a muscle relaxer, this can make you sleepy Follow up if no improvement

## 2021-12-26 NOTE — Assessment & Plan Note (Signed)
Muscle tightness on exam.  Tizanidine 2 mg twice daily as needed.  Sedation and dizzy precautions reviewed with patient

## 2022-01-08 ENCOUNTER — Telehealth: Payer: Self-pay | Admitting: Family Medicine

## 2022-01-08 ENCOUNTER — Encounter: Payer: Self-pay | Admitting: Family Medicine

## 2022-01-08 ENCOUNTER — Ambulatory Visit (INDEPENDENT_AMBULATORY_CARE_PROVIDER_SITE_OTHER): Payer: 59 | Admitting: Family Medicine

## 2022-01-08 VITALS — BP 138/72 | HR 75 | Temp 97.7°F | Ht 68.0 in | Wt 238.6 lb

## 2022-01-08 DIAGNOSIS — E1169 Type 2 diabetes mellitus with other specified complication: Secondary | ICD-10-CM | POA: Diagnosis not present

## 2022-01-08 DIAGNOSIS — I25118 Atherosclerotic heart disease of native coronary artery with other forms of angina pectoris: Secondary | ICD-10-CM

## 2022-01-08 DIAGNOSIS — R0989 Other specified symptoms and signs involving the circulatory and respiratory systems: Secondary | ICD-10-CM | POA: Diagnosis not present

## 2022-01-08 LAB — POCT GLYCOSYLATED HEMOGLOBIN (HGB A1C): Hemoglobin A1C: 6.1 % — AB (ref 4.0–5.6)

## 2022-01-08 MED ORDER — TRULICITY 0.75 MG/0.5ML ~~LOC~~ SOAJ: 0.7500 mg | SUBCUTANEOUS | 6 refills | Status: DC

## 2022-01-08 NOTE — Patient Instructions (Addendum)
Price out trulicity  If you start it, drop metformin dose to 1/2 tablet ('500mg'$ ) twice daily.  We will send you to [facility] for lower leg artery circulation evaluation (ankle-brachial index).  If started, return in 3 months for diabetes follow up visit.   Dulaglutide Injection What is this medication? DULAGLUTIDE (DOO la GLOO tide) treats type 2 diabetes. It works by increasing insulin levels in your body, which decreases your blood sugar (glucose). It also reduces the amount of sugar released into your blood and slows down your digestion. It can also be used to lower the risk of heart attack and stroke in people with type 2 diabetes. Changes to diet and exercise are often combined with this medication. This medicine may be used for other purposes; ask your health care provider or pharmacist if you have questions. COMMON BRAND NAME(S): Trulicity What should I tell my care team before I take this medication? They need to know if you have any of these conditions: Endocrine tumors (MEN 2) or if someone in your family had these tumors Eye disease, vision problems History of pancreatitis Kidney disease Liver disease Stomach or intestine problems Thyroid cancer or if someone in your family had thyroid cancer An unusual or allergic reaction to dulaglutide, other medications, foods, dyes, or preservatives Pregnant or trying to get pregnant Breast-feeding How should I use this medication? This medication is injected under the skin. You will be taught how to prepare and give it. Take it as directed on the prescription label on the same day of each week. Do NOT prime the pen. Keep taking it unless your care team tells you to stop. If you use this medication with insulin, you should inject this medication and the insulin separately. Do not mix them together. Do not give the injections right next to each other. Change (rotate) injection sites with each injection. This medication comes with INSTRUCTIONS  FOR USE. Ask your pharmacist for directions on how to use this medication. Read the information carefully. Talk to your pharmacist or care team if you have questions. It is important that you put your used needles and syringes in a special sharps container. Do not put them in a trash can. If you do not have a sharps container, call your pharmacist or care team to get one. A special MedGuide will be given to you by the pharmacist with each prescription and refill. Be sure to read this information carefully each time. Talk to your care team about the use of this medication in children. While it may be prescribed for children as young as 10 years for selected conditions, precautions do apply. Overdosage: If you think you have taken too much of this medicine contact a poison control center or emergency room at once. NOTE: This medicine is only for you. Do not share this medicine with others. What if I miss a dose? If you miss a dose, take it as soon as you can unless it is more than 3 days late. If it is more than 3 days late, skip the missed dose. Take the next dose at the normal time. What may interact with this medication? Other medications for diabetes Many medications may cause changes in blood sugar, these include: Alcohol Antiviral medications for HIV or AIDS Aspirin and aspirin-like medications Certain medications for blood pressure, heart disease, irregular heartbeat Chromium Diuretics Estrogen or progestin hormones Fenofibrate Gemfibrozil Isoniazid Lanreotide MAOIs, such as Carbex, Eldepryl, Marplan, Nardil, or Parnate Medications for allergies, asthma, cold, or cough Medications  for mental health conditions Medications for weight loss Niacin Nicotine NSAIDs, medications for pain and inflammation, such as ibuprofen or naproxen Octreotide Pasireotide Pentamidine Phenytoin Probenecid Quinolone antibiotics such as ciprofloxacin, levofloxacin, or ofloxacin Some herbal dietary  supplements Steroid medications, such as prednisone or cortisone Sulfamethoxazole; trimethoprim Testosterone or anabolic steroids Thyroid hormones Some medications can hide the warning symptoms of low blood sugar (hypoglycemia). You may need to monitor your blood sugar more closely if you are taking one of these medications. These include: Beta blockers, such as atenolol, metoprolol, or propranolol Clonidine Guanethidine Reserpine This list may not describe all possible interactions. Give your health care provider a list of all the medicines, herbs, non-prescription drugs, or dietary supplements you use. Also tell them if you smoke, drink alcohol, or use illegal drugs. Some items may interact with your medicine. What should I watch for while using this medication? Visit your care team for regular checks on your progress. Check with your care team if you have severe diarrhea, nausea, and vomiting, or if you sweat a lot. The loss of too much body fluid may make it dangerous for you to take this medication. A test called the HbA1C (A1C) will be monitored. This is a simple blood test. It measures your blood sugar control over the last 2 to 3 months. You will receive this test every 3 to 6 months. Learn how to check your blood sugar. Learn the symptoms of low and high blood sugar and how to manage them. Always carry a quick-source of sugar with you in case you have symptoms of low blood sugar. Examples include hard sugar candy or glucose tablets. Make sure others know that you can choke if you eat or drink when you develop serious symptoms of low blood sugar, such as seizures or unconsciousness. Get medical help at once. Tell your care team if you have high blood sugar. You might need to change the dose of your medication. If you are sick or exercising more than usual, you may need to change the dose of your medication. Do not skip meals. Ask your care team if you should avoid alcohol. Many  nonprescription cough and cold products contain sugar or alcohol. These can affect blood sugar. Pens should never be shared. Even if the needle is changed, sharing may result in passing of viruses like hepatitis or HIV. Wear a medical ID bracelet or chain. Carry a card that describes your condition. List the medications and doses you take on the card. What side effects may I notice from receiving this medication? Side effects that you should report to your care team as soon as possible: Allergic reactions--skin rash, itching, hives, swelling of the face, lips, tongue, or throat Change in vision Dehydration--increased thirst, dry mouth, feeling faint or lightheaded, headache, dark yellow or brown urine Kidney injury--decrease in the amount of urine, swelling of the ankles, hands, or feet Pancreatitis--severe stomach pain that spreads to your back or gets worse after eating or when touched, fever, nausea, vomiting Thyroid cancer--new mass or lump in the neck, pain or trouble swallowing, trouble breathing, hoarseness Side effects that usually do not require medical attention (report to your care team if they continue or are bothersome): Diarrhea Loss of appetite Nausea Stomach pain Vomiting This list may not describe all possible side effects. Call your doctor for medical advice about side effects. You may report side effects to FDA at 1-800-FDA-1088. Where should I keep my medication? Keep out of the reach of children and pets.  Refrigeration (preferred): Store unopened pens in a refrigerator between 2 and 8 degrees C (36 and 46 degrees F). Keep it in the original carton until you are ready to take it. Do not freeze or use if the medication has been frozen. Protect from light. Get rid of any unused medication after the expiration date on the label. Room Temperature: The pen may be stored at room temperature below 30 degrees C (86 degrees F) for up to a total of 14 days if needed. Protect from  light. Avoid exposure to extreme heat. If it is stored at room temperature, throw away any unused medication after 14 days or after it expires, whichever is first. To get rid of medications that are no longer needed or have expired: Take the medication to a medication take-back program. Check with your pharmacy or law enforcement to find a location. If you cannot return the medication, ask your pharmacist or care team how to get rid of this medication safely. NOTE: This sheet is a summary. It may not cover all possible information. If you have questions about this medicine, talk to your doctor, pharmacist, or health care provider.  2023 Elsevier/Gold Standard (2012-10-17 00:00:00)

## 2022-01-08 NOTE — Telephone Encounter (Signed)
I placed a referral for ABIs to be done in [place]. I think that is a good place to start - doesn't need to go to [facility] for this.

## 2022-01-08 NOTE — Assessment & Plan Note (Addendum)
H/o this, sees cardiology.  Did not tolerate imdur.  Will price out GLP1RA.

## 2022-01-08 NOTE — Assessment & Plan Note (Signed)
Encourage weight loss through sustainable dietary and lifestyle efforts. Obesity complicated by comorbidities of CAD, HTN, HLD, DM, OSA and osteoarthritis.

## 2022-01-08 NOTE — Assessment & Plan Note (Signed)
Will order ABIs at [facility].

## 2022-01-08 NOTE — Telephone Encounter (Signed)
Patient called and stated for Dr. Darnell Level to get him the best doctor in [facility] for his blood flow and feet.

## 2022-01-08 NOTE — Progress Notes (Signed)
Patient ID: Casey Golden, male    DOB: [birth year], 71 y.o.   MRN: [id number]  This visit was conducted in person.  BP 138/72   Pulse 75   Temp 97.7 F (36.5 C) (Temporal)   Ht _0  (1.727 m)   Wt 238 lb 9.6 oz (108.2 kg)   SpO2 98%   BMI 36.28 kg/m    CC: 6 mo DM f/u visit  Subjective:   HPI: Casey Golden is a 71 y.o. male presenting on 01/08/2022 for Follow-up (Here for 6 mo f/u.)   CAD - h/o MI 1999. Latest catheterization 01/2021 - mild-moderate non-obstructive CAD - did not tolerate nitrate.   DM - does not regularly check sugars. Compliant with antihyperglycemic regimen which includes: metformin 1081m bid. Denies low sugars or hypoglycemic symptoms. Denies blurry vision. Occasional toe paresthesias. Last diabetic eye exam 09/2021. Glucometer brand: unsure. Last foot exam: 10/2019 - DUE. [facility]  Lab Results  Component Value Date   HGBA1C 6.1 (A) 01/08/2022   Diabetic Foot Exam - Simple   Simple Foot Form Diabetic Foot exam was performed with the following findings: Yes 01/08/2022 10:16 AM  Visual Inspection No deformities, no ulcerations, no other skin breakdown bilaterally: Yes Sensation Testing See comments: Yes Pulse Check See comments: Yes Comments Diminished sensation to monofilament testing Decreased pedal pulses bilaterally Cold feet, L>R    Lab Results  Component Value Date   MICROALBUR <0.7 07/14/2021         Relevant past medical, surgical, family and social history reviewed and updated as indicated. Interim medical history since our last visit reviewed. Allergies and medications reviewed and updated. Outpatient Medications Prior to Visit  Medication Sig Dispense Refill   aspirin 81 MG EC tablet Take 1 tablet (81 mg total) by mouth daily. (Patient taking differently: Take 81 mg by mouth every evening.) 30 tablet 12   atorvastatin (LIPITOR) 40 MG tablet Take 1 tablet (40 mg total) by mouth daily. 90 tablet 3    Blood Glucose Monitoring Suppl (BLOOD GLUCOSE MONITOR SYSTEM) w/Device KIT 1 each by Does not apply route daily. Use as directed to check blood sugar once daily 1 each 0   diclofenac sodium (VOLTAREN) 1 % GEL APPLY 2 G TOPICALLY 3 (THREE) TIMES DAILY AS NEEDED (ANTI INFLAMMATORY). 100 g 1   gabapentin (NEURONTIN) 300 MG capsule Take 1 capsule (300 mg total) by mouth 3 (three) times daily. 270 capsule 3   Glucose Blood (BLOOD GLUCOSE TEST STRIPS) STRP 1 each by In Vitro route daily. Use as directed to check blood sugar once daily 100 each 0   ibuprofen (ADVIL,MOTRIN) 200 MG tablet Take 200-600 mg by mouth every 8 (eight) hours as needed for mild pain (severe back pain.).     Lancets MISC 1 each by Does not apply route daily. Use as directed to check blood sugar daily 100 each 0   losartan (COZAAR) 50 MG tablet Take 1 tablet (50 mg total) by mouth daily. 90 tablet 3   metFORMIN (GLUCOPHAGE) 1000 MG tablet Take 1 tablet (1,000 mg total) by mouth 2 (two) times daily with a meal. 180 tablet 3   nitroGLYCERIN (NITROSTAT) 0.4 MG SL tablet Place 1 tablet (0.4 mg total) under the tongue every 5 (five) minutes as needed for chest pain (do not take more than 3 doses.). 50 tablet 1   tiZANidine (ZANAFLEX) 2 MG tablet Take 1 tablet (2 mg total) by mouth 2 (two) times  daily as needed for muscle spasms. 15 tablet 0   No facility-administered medications prior to visit.     Per HPI unless specifically indicated in ROS section below Review of Systems  Objective:  BP 138/72   Pulse 75   Temp 97.7 F (36.5 C) (Temporal)   Ht _0  (1.727 m)   Wt 238 lb 9.6 oz (108.2 kg)   SpO2 98%   BMI 36.28 kg/m   Wt Readings from Last 3 Encounters:  01/08/22 238 lb 9.6 oz (108.2 kg)  12/26/21 239 lb (108.4 kg)  11/28/21 244 lb 4.3 oz (110.8 kg)      Physical Exam Vitals and nursing note reviewed.  Constitutional:      Appearance: Normal appearance. He is not ill-appearing.  HENT:     Mouth/Throat:     Mouth:  Mucous membranes are moist.     Pharynx: Oropharynx is clear. No oropharyngeal exudate or posterior oropharyngeal erythema.  Eyes:     Extraocular Movements: Extraocular movements intact.     Conjunctiva/sclera: Conjunctivae normal.     Pupils: Pupils are equal, round, and reactive to light.  Cardiovascular:     Rate and Rhythm: Normal rate and regular rhythm.     Pulses: Normal pulses.     Heart sounds: Normal heart sounds. No murmur heard. Pulmonary:     Effort: Pulmonary effort is normal. No respiratory distress.     Breath sounds: Normal breath sounds. No wheezing, rhonchi or rales.  Musculoskeletal:     Right lower leg: No edema.     Left lower leg: No edema.     Comments: See HPI for foot exam if done  Skin:    General: Skin is warm and dry.     Findings: No rash.  Neurological:     Mental Status: He is alert.  Psychiatric:        Mood and Affect: Mood normal.        Behavior: Behavior normal.       Results for orders placed or performed in visit on 01/08/22  POCT glycosylated hemoglobin (Hb A1C)  Result Value Ref Range   Hemoglobin A1C 6.1 (A) 4.0 - 5.6 %   HbA1c POC (<> result, manual entry)     HbA1c, POC (prediabetic range)     HbA1c, POC (controlled diabetic range)      Assessment & Plan:   Problem List Items Addressed This Visit       Unprioritized   CAD (coronary artery disease), native coronary artery    H/o this, sees cardiology.  Did not tolerate imdur.  Will price out GLP1RA.       Type 2 diabetes mellitus with other specified complication (HCC) - Primary    Chronic, great control on full dose metformin. Discussed weekly GLP1RA - he would benefit for weight loss and in known CAD history.  Reviewed side effects of medication and adverse events to monitor for. No fmhx thyroid cancer.  Will price out trulicity 6.23JS weekly, and if started, will drop metformin dose to 53m BID.  RTC 3 mo DM f/u visit.       Relevant Medications   Dulaglutide  (TRULICITY) 02.83MTD/1.7OHSOPN   Other Relevant Orders   POCT glycosylated hemoglobin (Hb A1C) (Completed)   VAS [place] WITH/WO TBI   Severe obesity (BMI 35.0-39.9) with comorbidity ([place]    Encourage weight loss through sustainable dietary and lifestyle efforts. Obesity complicated by comorbidities of CAD, HTN, HLD, DM, OSA and  osteoarthritis.       Relevant Medications   Dulaglutide (TRULICITY) 6.16 OH/7.2BM SOPN   Diminished pulses in lower extremity    Will order ABIs at [facility].       Relevant Orders   VAS [place] ABI WITH/WO TBI     Meds ordered this encounter  Medications   Dulaglutide (TRULICITY) 2.11 DB/5.2CE SOPN    Sig: Inject 0.75 mg into the skin once a week.    Dispense:  2 mL    Refill:  6   Orders Placed This Encounter  Procedures   POCT glycosylated hemoglobin (Hb A1C)    Patient instructions: Price out trulicity  If you start it, drop metformin dose to 1/2 tablet (519m) twice daily.  We will send you to  for lower leg artery circulation evaluation (ankle-brachial index).  If started, return in 3 months for diabetes follow up visit.   Follow up plan: Return in about 3 months (around 04/09/2022) for follow up visit.  JRia Bush MD

## 2022-01-08 NOTE — Assessment & Plan Note (Signed)
Chronic, great control on full dose metformin. Discussed weekly GLP1RA - he would benefit for weight loss and in known CAD history.  Reviewed side effects of medication and adverse events to monitor for. No fmhx thyroid cancer.  Will price out trulicity 0.'75mg'$  weekly, and if started, will drop metformin dose to '500mg'$  BID.  RTC 3 mo DM f/u visit.

## 2022-01-09 NOTE — Telephone Encounter (Signed)
Spoke with pt relaying Dr. G's message. Pt verbalizes understanding.  

## 2022-01-11 NOTE — Telephone Encounter (Signed)
Patient called in and stated that he hasn't heard from anyone to get this scheduled.

## 2022-01-12 NOTE — Telephone Encounter (Signed)
Looks like Dr. Danise Mina put in an order for a VAS [place] ABI on 01/08/22.

## 2022-01-12 NOTE — Telephone Encounter (Signed)
Per Jilda Roche, pt is scheduled on 03/02/22 for nerve test.   Lvm asking pt to call back. Need relay info above and pt should get a call with more info closer to appt.

## 2022-01-15 NOTE — Telephone Encounter (Signed)
Per Jilda Roche, pt is scheduled on 03/02/22 for nerve test.   Lvm asking pt to call back. Need relay info above and pt should get a call with more info closer to appt.

## 2022-01-21 ENCOUNTER — Ambulatory Visit
Admission: EM | Admit: 2022-01-21 | Discharge: 2022-01-21 | Disposition: A | Discharge status: Home / Self Care | Payer: 59 | Attending: Urgent Care | Admitting: Urgent Care

## 2022-01-21 DIAGNOSIS — R6889 Other general symptoms and signs: Secondary | ICD-10-CM

## 2022-01-21 LAB — POCT INFLUENZA A/B
Influenza A, POC: NEGATIVE
Influenza A, POC: NEGATIVE
Influenza B, POC: NEGATIVE

## 2022-01-21 NOTE — ED Provider Notes (Signed)
Casey Golden    CSN: [id number] Arrival date & time: 01/21/22  0920      History   Chief Complaint No chief complaint on file.   HPI Casey Golden is a 71 y.o. male.   HPI  Triage by provider.  Presents with complaint of "feel bad all over".  He says he had fever yesterday of 101.x while at work.  He states that he had some acute confusion and could not remember his codes necessary for his job.  He has dry occasional cough with some production of sputum which she describes as dark green.  He endorses headache and bodyaches as well as chills.  Denies nausea, vomiting, diarrhea.  Past Medical History:  Diagnosis Date   Arthritis    CAD (coronary artery disease)    cathx3 with nonobstructive disease. Last cath with RCA 40% stenosis in 2009   Cancer [facility])    Cataract 2019   bilateral; resolved with surgery   Colonic polyp    Fatty liver disease, nonalcoholic 0102   by [place]   GERD (gastroesophageal reflux disease)    History of chicken pox    Hyperlipidemia    Hypertension    Nocturia    OSA (obstructive sleep apnea)    CPAP, compliant   Paget's disease of bony pelvis    Past use of tobacco    Quit 1990, 70 pack year history   Rash of genital area    09-08-2014  per pt Dr Junious Silk aware   Seasonal and perennial allergic rhinitis    Type 2 diabetes mellitus ([place])    Wears dentures    full upper/  partial lower   Wears glasses     Patient Active Problem List   Diagnosis Date Noted   Diminished pulses in lower extremity 01/08/2022   Muscle tightness 12/26/2021   Acute left-sided low back pain without sciatica 12/26/2021   Lower abdominal pain 09/06/2021   First degree AV block 11/25/2020   Fall with injury 11/25/2020   Adverse reaction to pneumococcal vaccine 09/13/2020   Cerebrovascular small vessel disease 09/13/2020   Porokeratosis 05/16/2020   Plantar flexed metatarsal, left 05/16/2020   Plantar flexed metatarsal, right 05/16/2020   Polyp of  transverse colon    Medicare annual wellness visit, subsequent 05/27/2019   Centrilobular emphysema ([place]) 02/05/2019   Pulmonary nodule less than 6 mm determined by computed tomography of lung 11/06/2018   Atherosclerosis of aorta ([place]) 11/05/2018   Extramammary Paget's disease 10/24/2018   Pain due to onychomycosis of toenails of both feet 07/31/2018   Osteoarthritis of knee 05/23/2017   Cerumen impaction 05/20/2017   Ex-smoker 03/27/2017   Elevated PSA 03/27/2017   Nonintractable headache 11/22/2016   Skin rash 05/15/2016   Hx of colonic polyps    Benign neoplasm of transverse colon    Polyp of sigmoid colon    Benign neoplasm of descending colon    Chronic midline low back pain 02/27/2016   Bilateral hip pain 11/02/2015   Right knee pain 07/12/2015   Health maintenance examination 03/07/2015   Severe obesity (BMI 35.0-39.9) with comorbidity ([place]) 03/07/2015   Advanced care planning/counseling discussion 03/07/2015   Type 2 diabetes mellitus with other specified complication ([place]) 72/53/6644   Dyslipidemia associated with type 2 diabetes mellitus ([place]) 05/06/2014   OSA (obstructive sleep apnea) 05/06/2014   Fatty liver disease, nonalcoholic 03/47/4259   HTN (hypertension) 02/15/2009   CAD (coronary artery disease), native coronary artery 02/15/2009    Past  Surgical History:  Procedure Laterality Date   ABDOMINAL HERNIA REPAIR  2007      [place]   open repair   APPENDECTOMY  age 25   CARDIAC CATHETERIZATION  12-23-2007   [place]   Abnormal myoview w/ ischemia/  40% mRCA with nonobstructive and no sig. plaque in his left system, EF 55%   CARDIAC CATHETERIZATION  Apr 2008    [facility]   Abnormal myoview/  50% RCA,  ef 65%   CARDIAC CATHETERIZATION  1999      [facility]   CATARACT EXTRACTION, BILATERAL Bilateral 11/2017   CERVICAL FUSION  1992   CHOLECYSTECTOMY OPEN  2006   COLONOSCOPY WITH PROPOFOL N/A 04/17/2016   TAs, high grade dysplasia with margins clear, diverticulosis Lucilla Lame,  MD)   COLONOSCOPY WITH PROPOFOL N/A 08/07/2016   TAx1, diverticulosis, rpt 3 yrs ([place])   COLONOSCOPY WITH PROPOFOL N/A 01/05/2020   SSP, rpt Allen Norris, Darren, MD)   DENTAL SURGERY     metal dental implant L mandible   EXCISION OF SKIN TAG Right 09/14/2014   Procedure: EXCISION OF SKIN TAG;  Surgeon: Festus Aloe, MD;  Location: [facility];  Service: Urology;  Laterality: Right;   GROIN MASS OPEN BIOPSY Left 01/17/2019   HYDROCELE EXCISION Left 09/14/2014   Procedure: LEFT HYDROCELECTOMY ADULT;  Surgeon: Festus Aloe, MD;  Location[facility];  Service: Urology;  Laterality: Left;   LEFT HEART CATH AND CORONARY ANGIOGRAPHY Left 02/15/2021   Procedure: LEFT HEART CATH AND CORONARY ANGIOGRAPHY;  Surgeon: Nelva Bush, MD;  Location: [place] [facility];  Service: Cardiovascular;  Laterality: Left;   MOHS SURGERY  2015   skin cancer   TONSILLECTOMY  age 57       Home Medications    Prior to Admission medications   Medication Sig Start Date End Date Taking? Authorizing Provider  aspirin 81 MG EC tablet Take 1 tablet (81 mg total) by mouth daily. Patient taking differently: Take 81 mg by mouth every evening. 09/17/14   Festus Aloe, MD  atorvastatin (LIPITOR) 40 MG tablet Take 1 tablet (40 mg total) by mouth daily. 07/21/21   Ria Bush, MD  Blood Glucose Monitoring Suppl (BLOOD GLUCOSE MONITOR SYSTEM) w/Device KIT 1 each by Does not apply route daily. Use as directed to check blood sugar once daily 07/24/17   Ria Bush, MD  diclofenac sodium (VOLTAREN) 1 % GEL APPLY 2 G TOPICALLY 3 (THREE) TIMES DAILY AS NEEDED (ANTI INFLAMMATORY). 07/24/17   Ria Bush, MD  Dulaglutide (TRULICITY) 4.27 CW/2.3JS SOPN Inject 0.75 mg into the skin once a week. 01/08/22   Ria Bush, MD  gabapentin (NEURONTIN) 300 MG capsule Take 1 capsule (300 mg total) by mouth 3 (three) times daily. 09/06/21   Ria Bush, MD  Glucose Blood (BLOOD  GLUCOSE TEST STRIPS) STRP 1 each by In Vitro route daily. Use as directed to check blood sugar once daily 07/24/17   Ria Bush, MD  ibuprofen (ADVIL,MOTRIN) 200 MG tablet Take 200-600 mg by mouth every 8 (eight) hours as needed for mild pain (severe back pain.).    [provider]  Lancets MISC 1 each by Does not apply route daily. Use as directed to check blood sugar daily 07/24/17   Ria Bush, MD  losartan (COZAAR) 50 MG tablet Take 1 tablet (50 mg total) by mouth daily. 07/21/21   Ria Bush, MD  metFORMIN (GLUCOPHAGE) 1000 MG tablet Take 0.5 tablets (500 mg total) by mouth 2 (two) times  daily with a meal. 01/08/22   Ria Bush, MD  nitroGLYCERIN (NITROSTAT) 0.4 MG SL tablet Place 1 tablet (0.4 mg total) under the tongue every 5 (five) minutes as needed for chest pain (do not take more than 3 doses.). 02/08/21 01/08/22  Ria Bush, MD  tiZANidine (ZANAFLEX) 2 MG tablet Take 1 tablet (2 mg total) by mouth 2 (two) times daily as needed for muscle spasms. 12/26/21   Michela Pitcher, NP    Family History Family History  Problem Relation Age of Onset   CAD Mother        MI   Diabetes Mother    Stroke Mother        mini-stroke   Cancer Father 15       lung (smoker)   Cancer Sister        lung   Heart disease Paternal Grandmother    Heart disease Paternal Grandfather    Diabetes Paternal Grandfather    Coronary artery disease Neg Hx        Premature   Sleep apnea Neg Hx     Social History Social History   Tobacco Use   Smoking status: Former    Packs/day: 2.00    Years: 35.00    Total pack years: 70.00    Types: Cigarettes    Quit date: 01/29/1990    Years since quitting: 32.0   Smokeless tobacco: Never  Vaping Use   Vaping Use: Never used  Substance Use Topics   Alcohol use: No   Drug use: No     Allergies   Elemental sulfur, Imdur [isosorbide nitrate], and Sulfa antibiotics   Review of Systems Review of Systems   Physical  Exam Triage Vital Signs ED Triage Vitals  Enc Vitals Group     BP      Pulse      Resp      Temp      Temp src      SpO2      Weight      Height      Head Circumference      Peak Flow      Pain Score      Pain Loc      Pain Edu?      Excl. in [place]?    No data found.  Updated Vital Signs There were no vitals taken for this visit.  Visual Acuity Right Eye Distance:   Left Eye Distance:   Bilateral Distance:    Right Eye Near:   Left Eye Near:    Bilateral Near:     Physical Exam Vitals reviewed.  Constitutional:      Appearance: Normal appearance. He is ill-appearing.  Cardiovascular:     Rate and Rhythm: Normal rate and regular rhythm.     Pulses: Normal pulses.     Heart sounds: Normal heart sounds.  Pulmonary:     Effort: Pulmonary effort is normal.     Breath sounds: Normal breath sounds.  Neurological:     General: No focal deficit present.     Mental Status: He is alert and oriented to person, place, and time.  Psychiatric:        Mood and Affect: Mood normal.        Behavior: Behavior normal.      UC Treatments / Results  Labs (all labs ordered are listed, but only abnormal results are displayed) Labs Reviewed - No data to display  EKG   Radiology  No results found.  Procedures Procedures (including critical care time)  Medications Ordered in UC Medications - No data to display  Initial Impression / Assessment and Plan / UC Course  I have reviewed the triage vital signs and the nursing notes.  Pertinent labs & imaging results that were available during my care of the patient were reviewed by me and considered in my medical decision making (see chart for details).   Patient is afebrile here without recent antipyretics. Satting well on room air. Overall is very ill appearing, non-toxic. He appears well hydrated and without respiratory distress. Pulmonary exam is unremarkable.  Lungs CTAB without wheezing, rhonchi, rales.  No pharyngeal  erythema or peritonsillar exudates are present.  Patient's symptoms are consistent with viral process including flu and COVID.  Obtained POC flu which is negative.  Offered to obtain COVID swab to rule out however given holiday weekend, suggested to patient he might want to get early results by using at home swab.  Recommending continued use of OTC medication for symptom control.  Final Clinical Impressions(s) / UC Diagnoses   Final diagnoses:  None   Discharge Instructions   None    ED Prescriptions   None    PDMP not reviewed this encounter.   Rose Phi, [place] 01/21/22 1336

## 2022-01-21 NOTE — Discharge Instructions (Addendum)
You have been diagnosed with a viral upper respiratory infection based on your symptoms and exam. Viral illnesses cannot be treated with antibiotics - they are self limiting - and you should find your symptoms resolving within a few days. Get plenty of rest and non-caffeinated fluids. Watch for signs of dehydration including reduced urine output and dark colored urine.  We have perform in-clinic respiratory swab testing for influenza and test was negative for both influenza A & B.  I recommend you complete at home COVID test for earlier result considering the already weekend and limited lab availability to process COVID test as you would be eligible for anti-viral therapy for Covid.    We recommend you use over-the-counter medications for symptom control including acetaminophen (Tylenol), ibuprofen (Advil/Motrin) or naproxen (Aleve) for fever, chills or body aches. You may combine use of acetaminophen and ibuprofen/naproxen if needed. Also recommend age-appropriate cold/cough medication, but please note that some cough medications are not recommended if you suffer from hypertension.    Saline mist spray is helpful for removing excess mucus from your nose.  Room humidifiers are helpful to ease breathing at night. I recommend guaifenesin (Mucinex) to help thin and loosen mucus secretions in your respiratory passages.   If appropriate based upon your other medical problems, you might also find relief of nasal/sinus congestion symptoms by using a nasal decongestant such as Flonase (fluticasone) or Sudafed sinus (pseudoephedrine).  You will need to obtain Sudafed from behind the pharmacist counter.  Speak to the pharmacist to verify that you are not duplicating medications with other over-the-counter formulations that you may be using.   Follow up here or with your primary care provider if your symptoms are worsening or not improving.

## 2022-01-21 NOTE — ED Notes (Signed)
Disregard value entry for 1317 influenza results. See 1318 influenza results

## 2022-01-24 ENCOUNTER — Ambulatory Visit (INDEPENDENT_AMBULATORY_CARE_PROVIDER_SITE_OTHER): Payer: 59 | Admitting: Family Medicine

## 2022-01-24 ENCOUNTER — Encounter: Payer: Self-pay | Admitting: Family Medicine

## 2022-01-24 VITALS — BP 132/70 | HR 75 | Temp 98.0°F | Ht 68.0 in | Wt 237.1 lb

## 2022-01-24 DIAGNOSIS — J069 Acute upper respiratory infection, unspecified: Secondary | ICD-10-CM

## 2022-01-24 NOTE — Progress Notes (Signed)
Shauniece Kwan T. Casey Sanagustin, MD, [place] at [facility] [place], [address]  Phone: [phone]  FAX: [phone]  Casey Golden - 71 y.o. male  MRN [id number]  Date of Birth: Jan 08, 1951  Date: 01/24/2022  PCP: Ria Bush, MD  Referral: Ria Bush, MD  Chief Complaint  Patient presents with   Cough    With chest tightness-Seen at [facility] 01/21/22 and they only tested him for Flu which was negative Negative Home Covid Test 01/21/22   Shortness of Breath        Subjective:   Casey Golden is a 71 y.o. very pleasant male patient with Body mass index is 36.05 kg/m. who presents with the following:  Patient presents with some ongoing flulike symptoms and respiratory symptoms.  He was actually seen in urgent care on January 21, 2022, at that point they felt like he had a flulike illness.  He did have a negative influenza test.  He was going to do a home COVID test when he returned home.  Chest has been hurting, and he has been congested a lot.  Was instructed to check a home Covid test, and that was negative.    Feeling a lot better than he did on Christmas eve.  Monday did not feel all that well.   Has not had a temp - wife said it was high.  He is globally feeling better than he did a few days ago.  Drinking water and coffee.   Urinating and having normal bowel movements.     Review of Systems is noted in the HPI, as appropriate  Objective:   BP 132/70   Pulse 75   Temp 98 F (36.7 C) (Oral)   Ht _0  (1.727 m)   Wt 237 lb 2 oz (107.6 kg)   SpO2 97%   BMI 36.05 kg/m    Gen: WDWN, NAD. Globally Non-toxic HEENT: Throat clear, w/o exudate, R TM clear, L TM - good landmarks, No fluid present. rhinnorhea.  MMM Frontal sinuses: NT Max sinuses: NT NECK: Anterior cervical  LAD is absent CV: RRR, No M/G/R, cap refill <2 sec PULM: Breathing comfortably in no respiratory distress. no wheezing,  crackles, rhonchi   Laboratory and Imaging Data:  Assessment and Plan:     ICD-10-CM   1. Viral URI  J06.9      Resolving URI.  Reassurance.  Continue symptomatic care.  [employer] One speech-to-text software was used for transcription in this dictation.  Possible transcriptional errors can occur using Editor, commissioning.   Signed,  Maud Deed. Julian Medina, MD   Outpatient Encounter Medications as of 01/24/2022  Medication Sig   aspirin 81 MG EC tablet Take 1 tablet (81 mg total) by mouth daily.   atorvastatin (LIPITOR) 40 MG tablet Take 1 tablet (40 mg total) by mouth daily.   Blood Glucose Monitoring Suppl (BLOOD GLUCOSE MONITOR SYSTEM) w/Device KIT 1 each by Does not apply route daily. Use as directed to check blood sugar once daily   diclofenac sodium (VOLTAREN) 1 % GEL APPLY 2 G TOPICALLY 3 (THREE) TIMES DAILY AS NEEDED (ANTI INFLAMMATORY).   Dulaglutide (TRULICITY) 7.41 OI/7.8MV SOPN Inject 0.75 mg into the skin once a week.   gabapentin (NEURONTIN) 300 MG capsule Take 1 capsule (300 mg total) by mouth 3 (three) times daily.   Glucose Blood (BLOOD GLUCOSE TEST STRIPS) STRP 1 each by In Vitro route daily. Use as directed to check  blood sugar once daily   ibuprofen (ADVIL,MOTRIN) 200 MG tablet Take 200-600 mg by mouth every 8 (eight) hours as needed for mild pain (severe back pain.).   Lancets MISC 1 each by Does not apply route daily. Use as directed to check blood sugar daily   losartan (COZAAR) 50 MG tablet Take 1 tablet (50 mg total) by mouth daily.   metFORMIN (GLUCOPHAGE) 1000 MG tablet Take 0.5 tablets (500 mg total) by mouth 2 (two) times daily with a meal.   tiZANidine (ZANAFLEX) 2 MG tablet Take 1 tablet (2 mg total) by mouth 2 (two) times daily as needed for muscle spasms.   nitroGLYCERIN (NITROSTAT) 0.4 MG SL tablet Place 1 tablet (0.4 mg total) under the tongue every 5 (five) minutes as needed for chest pain (do not take more than 3 doses.).   No facility-administered  encounter medications on file as of 01/24/2022.

## 2022-02-16 ENCOUNTER — Other Ambulatory Visit: Payer: Self-pay

## 2022-02-21 ENCOUNTER — Other Ambulatory Visit: Payer: Self-pay | Admitting: Family Medicine

## 2022-02-21 DIAGNOSIS — E1169 Type 2 diabetes mellitus with other specified complication: Secondary | ICD-10-CM

## 2022-02-21 DIAGNOSIS — R0989 Other specified symptoms and signs involving the circulatory and respiratory systems: Secondary | ICD-10-CM

## 2022-02-21 DIAGNOSIS — I25118 Atherosclerotic heart disease of native coronary artery with other forms of angina pectoris: Secondary | ICD-10-CM

## 2022-02-26 ENCOUNTER — Other Ambulatory Visit: Payer: Self-pay

## 2022-02-26 ENCOUNTER — Encounter: Payer: Self-pay | Admitting: Cardiovascular Disease

## 2022-02-26 DIAGNOSIS — I10 Essential (primary) hypertension: Secondary | ICD-10-CM

## 2022-02-26 DIAGNOSIS — E1169 Type 2 diabetes mellitus with other specified complication: Secondary | ICD-10-CM

## 2022-02-26 MED ORDER — LOSARTAN POTASSIUM 50 MG PO TABS
50.0000 mg | ORAL_TABLET | Freq: Every day | ORAL | 1 refills | Status: DC
  Filled 2022-02-26: qty 90, 90d supply, fill #0

## 2022-02-26 MED ORDER — METFORMIN HCL 1000 MG PO TABS
500.0000 mg | ORAL_TABLET | Freq: Two times a day (BID) | ORAL | 1 refills | Status: DC
  Filled 2022-02-26: qty 90, 90d supply, fill #0

## 2022-02-26 MED ORDER — TRULICITY 0.75 MG/0.5ML ~~LOC~~ SOAJ
0.7500 mg | SUBCUTANEOUS | 6 refills | Status: DC
  Filled 2022-02-26: qty 2, 28d supply, fill #0
  Filled 2022-04-05: qty 2, 28d supply, fill #1
  Filled 2022-04-26: qty 2, 28d supply, fill #2
  Filled 2022-06-04: qty 2, 28d supply, fill #3

## 2022-02-26 MED ORDER — ATORVASTATIN CALCIUM 40 MG PO TABS
40.0000 mg | ORAL_TABLET | Freq: Every day | ORAL | 1 refills | Status: DC
  Filled 2022-02-26: qty 90, 90d supply, fill #0

## 2022-02-26 NOTE — Telephone Encounter (Signed)
Received secured message from Letta Median, [facility] with [place] stating pt is requesting rxs by Dr. Darnell Level be transferred to them.   I sent rxs for atorvastatin, Trulicity, losartan and metformin.  Gabapentin Last rx: 09/06/21. #270 Last OV: 01/08/22, 6 mo f/u Next OV: none

## 2022-02-26 NOTE — Telephone Encounter (Signed)
Made an error

## 2022-03-01 ENCOUNTER — Other Ambulatory Visit: Payer: Self-pay

## 2022-03-01 MED ORDER — GABAPENTIN 300 MG PO CAPS
300.0000 mg | ORAL_CAPSULE | Freq: Three times a day (TID) | ORAL | 3 refills | Status: DC
  Filled 2022-03-01: qty 270, 90d supply, fill #0

## 2022-03-02 ENCOUNTER — Ambulatory Visit: Payer: Medicare Other | Attending: Family Medicine

## 2022-03-02 DIAGNOSIS — R0989 Other specified symptoms and signs involving the circulatory and respiratory systems: Secondary | ICD-10-CM | POA: Diagnosis not present

## 2022-03-04 LAB — VAS US LOWER EXT ART SEG MULTI (SEGMENTALS & LE RAYNAUDS)
Left ABI: 1.12
Right ABI: 1.16

## 2022-03-05 ENCOUNTER — Other Ambulatory Visit: Payer: Self-pay

## 2022-03-28 NOTE — Progress Notes (Signed)
Cardiology Office Note  Date:  03/30/2022   ID:  Casey Golden, DOB 1950/08/10, MRN [id number]  PCP:  Ria Bush, MD   Chief Complaint  Patient presents with   Other    12 month f/u c/o no complaints today .  Meds reviewed verbally with pt.    HPI:  Casey Golden is a very pleasant 72 year old gentleman with past medical history of  Former smoker, quit 1990,  smoking for 27 years,   Coronary artery disease  50% mid RCA disease by cardiac catheterization in 2009 , 30% LAD disease  Catheterization performed February 15, 2021, mixed hyperlipidemia,  hypertension.  syncopal episode many years ago while walking his dog. He was down for one minute. He had no warning. He has not had any further episodes since that time Extramammary Paget's disease of scrotum Who presents for follow-up of his coronary artery disease  Last seen by myself in clinic February 2023 Doing well, Working part time [employer] Chronic knee and back issues  Denies chest pain concerning for angina Blood pressure stable  Helps take care of his 2 grandchildren, takes them to school, will often pick them up  Lab work reviewed A1C 6.1 Total cholesterol less than 100  EKG personally reviewed by myself on todays visit NSR rate 84 bpm nonspecific ST ABN  Prior records reviewed Catheterization performed February 15, 2021,   2nd Diag lesion is 20% stenosed.   Mid LAD to Dist LAD lesion is 35% stenosed.   Prox RCA lesion is 25% stenosed.   There is hyperdynamic left ventricular systolic function.   LV end diastolic pressure is mildly elevated.   The left ventricular ejection fraction is greater than 65% by visual estimate.   There is no aortic valve stenosis.   Mild-moderate, non-obstructive coronary artery disease, including long segment of mid LAD stenosis of up to 30-40%, 20% ostial D2 stenosis, and 20-30% proximal RCA lesion. Hyperdynamic left ventricular systolic function with mildly elevated filling  pressure (LVEDP ~20 mmHg).   Following the catheterization he was prescribed Imdur 15 daily Reports that he tried the medication for several days but had to stop Side effects on imdur, "sick to stomach", could not get up, H/A  Quit smoking before 2000  Other past medical history reviewed Seen by dermatology Extramammary Paget's disease of scrotum , surgery at [facility], MOHS Post op hematoma next day CT scan: 5.6 x 8.4 x 14.2 cm curvilinear hematoma extending from the left inguinal region into the scrotal sac.   CT scan: degenerative changes throughout the spine associated with a thoracolumbar scoliosis. There is osseous foraminal narrowing in the lower lumbar spine, especially on the right at L4-5 and to the left at L5-S1.  Cardiac catheterization report 05/13/2006 detailing normal ejection fraction, 50% mid RCA disease    testicular surgery 09/14/2014 in [place] with Dr. Junious Silk.    positive Myoview several years ago which resulted in a heart catheterization.  He had nonobstructive disease. His normal left ventricular function.    PMH:   has a past medical history of Arthritis, CAD (coronary artery disease), Cancer ([place]), Cataract (2019), Colonic polyp, Fatty liver disease, nonalcoholic (123456), GERD (gastroesophageal reflux disease), History of chicken pox, Hyperlipidemia, Hypertension, Nocturia, OSA (obstructive sleep apnea), Paget's disease of bony pelvis, Past use of tobacco, Rash of genital area, Seasonal and perennial allergic rhinitis, Type 2 diabetes mellitus ([place]), Wears dentures, and Wears glasses.  PSH:    Past Surgical History:  Procedure Laterality Date   ABDOMINAL  HERNIA REPAIR  2007      [place]   open repair   APPENDECTOMY  age 24   CARDIAC CATHETERIZATION  12-23-2007   [place]   Abnormal myoview w/ ischemia/  40% mRCA with nonobstructive and no sig. plaque in his left system, EF 55%   CARDIAC CATHETERIZATION  Apr 2008    [facility]   Abnormal myoview/  50% RCA,  ef 65%    CARDIAC CATHETERIZATION  1999      [facility]   CATARACT EXTRACTION, BILATERAL Bilateral 11/2017   CERVICAL FUSION  1992   CHOLECYSTECTOMY OPEN  2006   COLONOSCOPY WITH PROPOFOL N/A 04/17/2016   TAs, high grade dysplasia with margins clear, diverticulosis Lucilla Lame, MD)   COLONOSCOPY WITH PROPOFOL N/A 08/07/2016   TAx1, diverticulosis, rpt 3 yrs ([place])   COLONOSCOPY WITH PROPOFOL N/A 01/05/2020   SSP, rpt Allen Norris, Darren, MD)   DENTAL SURGERY     metal dental implant L mandible   EXCISION OF SKIN TAG Right 09/14/2014   Procedure: EXCISION OF SKIN TAG;  Surgeon: Festus Aloe, MD;  Location: [facility];  Service: Urology;  Laterality: Right;   GROIN MASS OPEN BIOPSY Left 01/17/2019   HYDROCELE EXCISION Left 09/14/2014   Procedure: LEFT HYDROCELECTOMY ADULT;  Surgeon: Festus Aloe, MD;  [facility] [place];  Service: Urology;  Laterality: Left;   LEFT HEART CATH AND CORONARY ANGIOGRAPHY Left 02/15/2021   Procedure: LEFT HEART CATH AND CORONARY ANGIOGRAPHY;  Surgeon: Nelva Bush, MD;  Location: [facility];  Service: Cardiovascular;  Laterality: Left;   MOHS SURGERY  2015   skin cancer   TONSILLECTOMY  age 2    Current Outpatient Medications  Medication Sig Dispense Refill   aspirin 81 MG EC tablet Take 1 tablet (81 mg total) by mouth daily. 30 tablet 12   atorvastatin (LIPITOR) 40 MG tablet Take 1 tablet (40 mg total) by mouth daily. 90 tablet 1   Blood Glucose Monitoring Suppl (BLOOD GLUCOSE MONITOR SYSTEM) w/Device KIT 1 each by Does not apply route daily. Use as directed to check blood sugar once daily 1 each 0   diclofenac sodium (VOLTAREN) 1 % GEL APPLY 2 G TOPICALLY 3 (THREE) TIMES DAILY AS NEEDED (ANTI INFLAMMATORY). 100 g 1   Dulaglutide (TRULICITY) A999333 0000000 SOPN Inject 0.75 mg into the skin once a week. 2 mL 6   gabapentin (NEURONTIN) 300 MG capsule Take 300 mg by mouth 3 (three) times daily.     Glucose Blood (BLOOD GLUCOSE  TEST STRIPS) STRP 1 each by In Vitro route daily. Use as directed to check blood sugar once daily 100 each 0   ibuprofen (ADVIL,MOTRIN) 200 MG tablet Take 200-600 mg by mouth every 8 (eight) hours as needed for mild pain (severe back pain.).     Lancets MISC 1 each by Does not apply route daily. Use as directed to check blood sugar daily 100 each 0   losartan (COZAAR) 50 MG tablet Take 1 tablet (50 mg total) by mouth daily. 90 tablet 1   metFORMIN (GLUCOPHAGE) 1000 MG tablet Take 0.5 tablets (500 mg total) by mouth 2 (two) times daily with a meal. 90 tablet 1   nitroGLYCERIN (NITROSTAT) 0.4 MG SL tablet Place 1 tablet (0.4 mg total) under the tongue every 5 (five) minutes as needed for chest pain (do not take more than 3 doses.). 50 tablet 1   tiZANidine (ZANAFLEX) 2 MG tablet Take 1 tablet (2 mg total) by mouth  2 (two) times daily as needed for muscle spasms. 15 tablet 0   No current facility-administered medications for this visit.    Allergies:   Elemental sulfur, Imdur [isosorbide nitrate], and Sulfa antibiotics   Social History:  The patient  reports that he quit smoking about 32 years ago. His smoking use included cigarettes. He has a 70.00 pack-year smoking history. He has never used smokeless tobacco. He reports that he does not drink alcohol and does not use drugs.   Family History:   family history includes CAD in his mother; Cancer in his sister; Cancer (age of onset: 57) in his father; Diabetes in his mother and paternal grandfather; Heart disease in his paternal grandfather and paternal grandmother; Stroke in his mother.   Review of Systems: Review of Systems  Constitutional: Negative.   HENT: Negative.    Respiratory: Negative.    Cardiovascular: Negative.   Gastrointestinal: Negative.   Musculoskeletal:  Positive for back pain and joint pain.  Neurological: Negative.   Psychiatric/Behavioral: Negative.    All other systems reviewed and are negative.   PHYSICAL EXAM: VS:   BP 120/60 (BP Location: Left Arm, Patient Position: Sitting, Cuff Size: Normal)   Pulse 84   Ht '5\' 8"'$  (1.727 m)   Wt 225 lb 4 oz (102.2 kg)   SpO2 98%   BMI 34.25 kg/m  , BMI Body mass index is 34.25 kg/m.   Recent Labs: 11/28/2021: ALT 41; BUN 18; Creatinine, Ser 0.83; Hemoglobin 15.9; Platelets 268; Potassium 4.0; Sodium 138    Lipid Panel Lab Results  Component Value Date   CHOL 83 07/14/2021   HDL 29.30 (L) 07/14/2021   LDLCALC 27 07/14/2021   TRIG 132.0 07/14/2021      Wt Readings from Last 3 Encounters:  03/30/22 225 lb 4 oz (102.2 kg)  01/24/22 237 lb 2 oz (107.6 kg)  01/08/22 238 lb 9.6 oz (108.2 kg)      ASSESSMENT AND PLAN:  Coronary artery disease involving native coronary artery of native heart without angina pectoris -  Prior cardiac cath, nonobstructive disease Did not tolerate imdur after cath Just wishes to take NTG sl Non on b-blocker, BP low Currently with no symptoms of angina. No further workup at this time. Continue current medication regimen.  Essential hypertension Blood pressure is well controlled on today's visit. No changes made to the medications.  controlled type 2 diabetes with neuropathy (HCC) A1c 6.1 Numbers well controlled  Osteoarthritis Chronic knee pain, back pain Long hours of working at [employer], now part time  Chronic midline low back pain, with sciatica presence unspecified Previously seen by chiropractor [employer] part time [employer]   Total encounter time more than 30 minutes  Greater than 50% was spent in counseling and coordination of care with the patient   Orders Placed This Encounter  Procedures   EKG 12-Lead     Signed, Esmond Plants, M.D., Ph.D. 03/30/2022  [place], [place]

## 2022-03-30 ENCOUNTER — Encounter: Payer: Self-pay | Admitting: Cardiovascular Disease

## 2022-03-30 ENCOUNTER — Ambulatory Visit: Payer: Medicare Other | Attending: Cardiovascular Disease | Admitting: Cardiovascular Disease

## 2022-03-30 VITALS — BP 120/60 | HR 84 | Ht 68.0 in | Wt 225.2 lb

## 2022-03-30 DIAGNOSIS — I10 Essential (primary) hypertension: Secondary | ICD-10-CM | POA: Diagnosis not present

## 2022-03-30 DIAGNOSIS — E1169 Type 2 diabetes mellitus with other specified complication: Secondary | ICD-10-CM | POA: Diagnosis not present

## 2022-03-30 DIAGNOSIS — I700 Atherosclerosis of aorta: Secondary | ICD-10-CM | POA: Diagnosis not present

## 2022-03-30 DIAGNOSIS — I679 Cerebrovascular disease, unspecified: Secondary | ICD-10-CM

## 2022-03-30 DIAGNOSIS — I25118 Atherosclerotic heart disease of native coronary artery with other forms of angina pectoris: Secondary | ICD-10-CM | POA: Diagnosis not present

## 2022-03-30 DIAGNOSIS — E782 Mixed hyperlipidemia: Secondary | ICD-10-CM

## 2022-03-30 NOTE — Patient Instructions (Signed)
Medication Instructions:  No changes  If you need a refill on your cardiac medications before your next appointment, please call your pharmacy.   Lab work: No new labs needed  Testing/Procedures: No new testing needed  Follow-Up: At [facility], you and your health needs are our priority.  As part of our continuing mission to provide you with exceptional heart care, we have created designated Provider Care Teams.  These Care Teams include your primary Cardiologist (physician) and Advanced Practice Providers (APPs -  Physician Assistants and Nurse Practitioners) who all work together to provide you with the care you need, when you need it.  You will need a follow up appointment in 12 months  Providers on your designated Care Team:   Christopher Berge, NP Ryan Dunn, PA-C Cadence Furth, PA-C  COVID-19 Vaccine Information can be found at: [email or web address] For questions related to vaccine distribution or appointments, please email [email or web address] or call [phone].   

## 2022-04-05 ENCOUNTER — Other Ambulatory Visit: Payer: Self-pay

## 2022-04-26 ENCOUNTER — Other Ambulatory Visit: Payer: Self-pay

## 2022-05-04 ENCOUNTER — Telehealth: Payer: Self-pay | Admitting: Family Medicine

## 2022-05-04 NOTE — Telephone Encounter (Signed)
Pt came by office asking if Dr. [name] knew when his next colonoscopy will be? Call back # [phone]

## 2022-05-04 NOTE — Telephone Encounter (Signed)
Called and spoke to patient and advised him that when our records show that he had a colonoscopy 01/05/20 and it appears that he needs a repeat in 5 years. Patient and his wife were given the telephone number of the GI doctor to call and verify this information.

## 2022-05-15 LAB — HM DIABETES EYE EXAM

## 2022-05-18 ENCOUNTER — Encounter (INDEPENDENT_AMBULATORY_CARE_PROVIDER_SITE_OTHER): Payer: Self-pay

## 2022-05-24 ENCOUNTER — Encounter: Payer: Self-pay | Admitting: Family Medicine

## 2022-05-24 ENCOUNTER — Ambulatory Visit (INDEPENDENT_AMBULATORY_CARE_PROVIDER_SITE_OTHER): Payer: Medicare Other | Admitting: Family Medicine

## 2022-05-24 VITALS — BP 120/60 | HR 68 | Temp 98.1°F | Ht 68.0 in | Wt 220.0 lb

## 2022-05-24 DIAGNOSIS — R42 Dizziness and giddiness: Secondary | ICD-10-CM | POA: Diagnosis not present

## 2022-05-24 DIAGNOSIS — E1169 Type 2 diabetes mellitus with other specified complication: Secondary | ICD-10-CM | POA: Diagnosis not present

## 2022-05-24 DIAGNOSIS — Z7985 Long-term (current) use of injectable non-insulin antidiabetic drugs: Secondary | ICD-10-CM | POA: Diagnosis not present

## 2022-05-24 MED ORDER — MECLIZINE HCL 12.5 MG PO TABS: 12.5000 mg | ORAL_TABLET | Freq: Three times a day (TID) | ORAL | 0 refills | Status: DC | PRN

## 2022-05-24 MED ORDER — METFORMIN HCL 1000 MG PO TABS: 1000.0000 mg | ORAL_TABLET | Freq: Two times a day (BID) | ORAL | Status: DC

## 2022-05-24 NOTE — Progress Notes (Signed)
Vertigo.  Episodic sx.  Pressure sensation on the top of the head. This AM the room was spinning for about 1 hour, then gradually got better as the day went along.  He had woken up with symptoms, prior to getting out of bed.  No recent sx, prior to today.  No new hearing changes.  No FCNAVD.  Not sick o/w.  Longstanding gabapentin use, at baseline, w/o ADE prev.  No double vision.  No other focal neuro sx.    Meds, vitals, and allergies reviewed.   ROS: Per HPI unless specifically indicated in ROS section   GEN: nad, alert and oriented HEENT: Right tympanic membrane exam with some wax in the canal.  Left tympanic membrane exam normal., decreased hearing in left ear at baseline.  Does not no change.  Dix-Hallpike positive when moving from supine with the head rotated to the right to a sitting position facing forward. NECK: supple w/o LA CV: rrr.  PULM: ctab, no inc wob ABD: soft, +bs EXT: no edema SKIN: Well-perfused.

## 2022-05-24 NOTE — Patient Instructions (Signed)
Likely BPV.  Try bedside exercise for vertigo if needed, along with meclizine.  Update [place] as needed, if not getting better.   Take care.  Glad to see you.

## 2022-05-24 NOTE — Telephone Encounter (Signed)
Spoke with pt asking about sxs. States he woke this morning with a bad vertigo episode. Says he's feeling a better now but wants to be seen. Scheduled OV today at 3:00 with Dr. Para March.

## 2022-05-27 DIAGNOSIS — R42 Dizziness and giddiness: Secondary | ICD-10-CM | POA: Insufficient documentation

## 2022-05-27 NOTE — Assessment & Plan Note (Signed)
Likely BPV.  Anatomy and pathophysiology discussed with patient. Reasonable to try bedside exercise for vertigo if needed, along with meclizine.  Update [place] as needed, if not getting better.  He agrees to plan.

## 2022-05-30 ENCOUNTER — Telehealth: Payer: Self-pay | Admitting: Family Medicine

## 2022-05-30 DIAGNOSIS — E1169 Type 2 diabetes mellitus with other specified complication: Secondary | ICD-10-CM

## 2022-05-30 DIAGNOSIS — I10 Essential (primary) hypertension: Secondary | ICD-10-CM

## 2022-05-30 MED ORDER — LOSARTAN POTASSIUM 50 MG PO TABS: 50.0000 mg | ORAL_TABLET | Freq: Every day | ORAL | 1 refills | Status: DC

## 2022-05-30 MED ORDER — ATORVASTATIN CALCIUM 40 MG PO TABS: 40.0000 mg | ORAL_TABLET | Freq: Every day | ORAL | 1 refills | Status: DC

## 2022-05-30 MED ORDER — GABAPENTIN 300 MG PO CAPS: 300.0000 mg | ORAL_CAPSULE | Freq: Three times a day (TID) | ORAL | 1 refills | Status: DC

## 2022-05-30 NOTE — Telephone Encounter (Signed)
Atorvastatin, gabapentin, losartan sent to mailorder.  Per last OV 12/2021, plan was to start Trulicity 0.75mg  weekly and if started then he needed to drop metformin to 500mg  BID. Did he do this? I need to know to send correct metformin dose to pharmacy.  Also I had asked him to return in 3 months for diabetes f/u visit. Sounds like he needs DM f/u visit - please schedule ASAP to review ongoing need for trulicity.

## 2022-05-30 NOTE — Telephone Encounter (Signed)
Prescription Request  05/30/2022  LOV: 01/08/2022  What is the name of the medication or equipment?  atorvastatin (LIPITOR) 40 MG tablet gabapentin (NEURONTIN) 300 MG capsule losartan (COZAAR) 50 MG tablet metFORMIN (GLUCOPHAGE) 1000 MG tablet   Have you contacted your pharmacy to request a refill? No   Which pharmacy would you like this sent to?   [facility]) [facility] - [place], [place] - [address] [place] [address] Phone: [phone] Fax: [phone]    Patient notified that their request is being sent to the clinical staff for review and that they should receive a response within 2 business days.   Please advise at Mobile [phone] (mobile)  Patient would also like to know if Dr. Sharen Hones would like him to continue taking his trulicity, says he has lost 50 lbs so he does not know if he should continue taking it. Please advise, thank you.

## 2022-05-30 NOTE — Telephone Encounter (Signed)
Patient notified as instructed by telephone and verbalized understanding. Patient stated that he did not drop his Metformin and he has been taking 1000 mg twice a day. Patient scheduled an office visit on 06/01/22 at 12:00 noon with Dr. Sharen Hones. Dr. Sharen Hones aware of the appointment.

## 2022-05-30 NOTE — Telephone Encounter (Signed)
Called and spoke with patient, he states he recently switched insurance and can no longer get his Rx from [facility] at a low cost. Patient needs the below medication sent to [facility].   Patient would also like to know if Dr. Sharen Hones would like him to continue taking his trulicity, says he has lost 50 lbs so he does not know if he should continue taking it. Please advise, thank you.

## 2022-06-01 ENCOUNTER — Ambulatory Visit (INDEPENDENT_AMBULATORY_CARE_PROVIDER_SITE_OTHER): Payer: Medicare Other | Admitting: Family Medicine

## 2022-06-01 ENCOUNTER — Encounter: Payer: Self-pay | Admitting: Family Medicine

## 2022-06-01 VITALS — BP 126/66 | HR 72 | Temp 97.3°F | Ht 68.0 in | Wt 218.4 lb

## 2022-06-01 DIAGNOSIS — I10 Essential (primary) hypertension: Secondary | ICD-10-CM | POA: Diagnosis not present

## 2022-06-01 DIAGNOSIS — E669 Obesity, unspecified: Secondary | ICD-10-CM | POA: Diagnosis not present

## 2022-06-01 DIAGNOSIS — Z7984 Long term (current) use of oral hypoglycemic drugs: Secondary | ICD-10-CM | POA: Diagnosis not present

## 2022-06-01 DIAGNOSIS — E1169 Type 2 diabetes mellitus with other specified complication: Secondary | ICD-10-CM

## 2022-06-01 LAB — POCT GLYCOSYLATED HEMOGLOBIN (HGB A1C): Hemoglobin A1C: 5.4 % (ref 4.0–5.6)

## 2022-06-01 MED ORDER — LANCET DEVICE MISC: 1.0000 | Freq: Three times a day (TID) | 0 refills | Status: AC

## 2022-06-01 MED ORDER — METFORMIN HCL 500 MG PO TABS: 500.0000 mg | ORAL_TABLET | Freq: Every day | ORAL | 1 refills | Status: DC

## 2022-06-01 MED ORDER — BLOOD GLUCOSE TEST VI STRP: 1.0000 | ORAL_STRIP | Freq: Three times a day (TID) | 0 refills | Status: AC

## 2022-06-01 MED ORDER — BLOOD GLUCOSE MONITOR SYSTEM W/DEVICE KIT
1.0000 | PACK | Freq: Three times a day (TID) | 0 refills | Status: AC
  Filled 2022-08-31: qty 1, 1d supply, fill #0

## 2022-06-01 MED ORDER — LANCETS MISC. MISC: 1.0000 | Freq: Three times a day (TID) | 0 refills | Status: AC

## 2022-06-01 NOTE — Assessment & Plan Note (Addendum)
Chronic, stable on current regimen - continue. 

## 2022-06-01 NOTE — Patient Instructions (Addendum)
Schedule follow up appointment with hearing doctor.  Continue trulicity 0.75mg  weekly. Drop metformin dose to 500mg  once daily. New dose sent to pharmacy.  Schedule physical after 07/19/2022.

## 2022-06-01 NOTE — Progress Notes (Signed)
Ph: [phone]       Fax: [phone]   Patient ID: Casey Golden, male    DOB: [birth year], 72 y.o.   MRN: [id number]  This visit was conducted in person.  BP 126/66   Pulse 72   Temp (!) 97.3 F (36.3 C) (Temporal)   Ht 5\' 8"  (1.727 m)   Wt 218 lb 6 oz (99.1 kg)   SpO2 92%   BMI 33.20 kg/m    CC: DM f/u visit  Subjective:   HPI: Casey Golden is a 72 y.o. male presenting on 06/01/2022 for Medical Management of Chronic Issues (Here for DM f/u.)   See recent phone note.   Seen last week with vertigo thought BPV treated with home exercises with benefit. He also used wrist bands for motion sickness with benefit.   20 lb weight loss since starting trulicity 0.75mg  weekly. 244 lbs 10/2021, 238 lbs 12/2021. Healthier diet, more fruits, salads, only eating 2 meals/day.   DM - does not regularly check sugars. Compliant with antihyperglycemic regimen which includes: trulicity 0.75mg  weekly, metformin 1000mg  bid. Tolerating well, occ constipation, no nausea or diarrhea. No epigastric pain. Denies low sugars or hypoglycemic symptoms. Denies paresthesias, blurry vision. Last diabetic eye exam 04/2022. Glucometer brand: doesn't have one. Last foot exam: 12/2021. DSME: 2019 startd, not completed. Lab Results  Component Value Date   HGBA1C 5.4 06/01/2022   Diabetic Foot Exam - Simple   No data filed    Lab Results  Component Value Date   MICROALBUR <0.7 07/14/2021         Relevant past medical, surgical, family and social history reviewed and updated as indicated. Interim medical history since our last visit reviewed. Allergies and medications reviewed and updated. Outpatient Medications Prior to Visit  Medication Sig Dispense Refill   aspirin 81 MG EC tablet Take 1 tablet (81 mg total) by mouth daily. 30 tablet 12   atorvastatin (LIPITOR) 40 MG tablet Take 1 tablet (40 mg total) by mouth daily. 90 tablet 1   diclofenac sodium (VOLTAREN) 1 % GEL APPLY 2 G TOPICALLY 3  (THREE) TIMES DAILY AS NEEDED (ANTI INFLAMMATORY). 100 g 1   Dulaglutide (TRULICITY) 0.75 MG/0.5ML SOPN Inject 0.75 mg into the skin once a week. 2 mL 6   gabapentin (NEURONTIN) 300 MG capsule Take 1 capsule (300 mg total) by mouth 3 (three) times daily. 270 capsule 1   ibuprofen (ADVIL,MOTRIN) 200 MG tablet Take 200-600 mg by mouth every 8 (eight) hours as needed for mild pain (severe back pain.).     losartan (COZAAR) 50 MG tablet Take 1 tablet (50 mg total) by mouth daily. 90 tablet 1   meclizine (ANTIVERT) 12.5 MG tablet Take 1 tablet (12.5 mg total) by mouth 3 (three) times daily as needed for dizziness. 30 tablet 0   nitroGLYCERIN (NITROSTAT) 0.4 MG SL tablet Place 1 tablet (0.4 mg total) under the tongue every 5 (five) minutes as needed for chest pain (do not take more than 3 doses.). 50 tablet 1   tiZANidine (ZANAFLEX) 2 MG tablet Take 1 tablet (2 mg total) by mouth 2 (two) times daily as needed for muscle spasms. 15 tablet 0   metFORMIN (GLUCOPHAGE) 1000 MG tablet Take 1 tablet (1,000 mg total) by mouth 2 (two) times daily with a meal.     Blood Glucose Monitoring Suppl (BLOOD GLUCOSE MONITOR SYSTEM) w/Device KIT 1 each by Does not apply route daily. Use as directed to check blood sugar  once daily 1 each 0   No facility-administered medications prior to visit.     Per HPI unless specifically indicated in ROS section below Review of Systems  Objective:  BP 126/66   Pulse 72   Temp (!) 97.3 F (36.3 C) (Temporal)   Ht 5\' 8"  (1.727 m)   Wt 218 lb 6 oz (99.1 kg)   SpO2 92%   BMI 33.20 kg/m   Wt Readings from Last 3 Encounters:  06/01/22 218 lb 6 oz (99.1 kg)  05/24/22 220 lb (99.8 kg)  03/30/22 225 lb 4 oz (102.2 kg)      Physical Exam Vitals and nursing note reviewed.  Constitutional:      Appearance: Normal appearance. He is not ill-appearing.  Eyes:     Extraocular Movements: Extraocular movements intact.     Conjunctiva/sclera: Conjunctivae normal.     Pupils: Pupils  are equal, round, and reactive to light.  Cardiovascular:     Rate and Rhythm: Normal rate and regular rhythm.     Pulses: Normal pulses.     Heart sounds: Normal heart sounds. No murmur heard. Pulmonary:     Effort: Pulmonary effort is normal. No respiratory distress.     Breath sounds: Normal breath sounds. No wheezing, rhonchi or rales.  Musculoskeletal:     Right lower leg: No edema.     Left lower leg: No edema.     Comments:  See HPI for foot exam if done Stooped posture with ambulation  Skin:    General: Skin is warm and dry.     Findings: No rash.  Neurological:     Mental Status: He is alert.  Psychiatric:        Mood and Affect: Mood normal.        Behavior: Behavior normal.       Results for orders placed or performed in visit on 06/01/22  POCT glycosylated hemoglobin (Hb A1C)  Result Value Ref Range   Hemoglobin A1C 5.4 4.0 - 5.6 %   HbA1c POC (<> result, manual entry)     HbA1c, POC (prediabetic range)     HbA1c, POC (controlled diabetic range)     Lab Results  Component Value Date   CREATININE 0.83 11/28/2021   BUN 18 11/28/2021   NA 138 11/28/2021   K 4.0 11/28/2021   CL 104 11/28/2021   CO2 28 11/28/2021    Lab Results  Component Value Date   WBC 7.6 11/28/2021   HGB 15.9 11/28/2021   HCT 46.1 11/28/2021   MCV 96.2 11/28/2021   PLT 268 11/28/2021    Lab Results  Component Value Date   ALT 41 11/28/2021   AST 43 (H) 11/28/2021   ALKPHOS 56 11/28/2021   BILITOT 1.0 11/28/2021    Assessment & Plan:   Problem List Items Addressed This Visit     HTN (hypertension)    Chronic, stable on current regimen - continue.       Type 2 diabetes mellitus with other specified complication (HCC) - Primary    Chronic, significant weight loss since starting trulicity, didn't drop metformin dose yet.  A1c 5.4% - will drop metformin to 500mg  once daily, continue trulicity 0.75mg  weekly. Reassess at CPE.       Relevant Medications   metFORMIN  (GLUCOPHAGE) 500 MG tablet   Other Relevant Orders   POCT glycosylated hemoglobin (Hb A1C) (Completed)   Obesity, Class I, BMI 30-34.9    Congratulated on weight loss to date.  20 lbs down since starting Trulicity, had lost some weight even prior. Continue healthy diet choices.         Meds ordered this encounter  Medications   Blood Glucose Monitoring Suppl DEVI    Sig: 1 each by Does not apply route in the morning, at noon, and at bedtime. May substitute to any manufacturer covered by patient's insurance.    Dispense:  1 each    Refill:  0   Glucose Blood (BLOOD GLUCOSE TEST STRIPS) STRP    Sig: 1 each by In Vitro route in the morning, at noon, and at bedtime. May substitute to any manufacturer covered by patient's insurance.    Dispense:  100 strip    Refill:  0   Lancet Device MISC    Sig: 1 each by Does not apply route in the morning, at noon, and at bedtime. May substitute to any manufacturer covered by patient's insurance.    Dispense:  1 each    Refill:  0   Lancets Misc. MISC    Sig: 1 each by Does not apply route in the morning, at noon, and at bedtime. May substitute to any manufacturer covered by patient's insurance.    Dispense:  100 each    Refill:  0   metFORMIN (GLUCOPHAGE) 500 MG tablet    Sig: Take 1 tablet (500 mg total) by mouth daily with breakfast.    Dispense:  90 tablet    Refill:  1    Note new dose    Orders Placed This Encounter  Procedures   POCT glycosylated hemoglobin (Hb A1C)    Patient Instructions  Schedule follow up appointment with hearing doctor.  Continue trulicity 0.75mg  weekly. Drop metformin dose to 500mg  once daily. New dose sent to pharmacy.  Schedule physical after 07/19/2022.   Follow up plan: Return in about 6 weeks (around 07/13/2022) for annual exam, prior fasting for blood work.  Eustaquio Boyden, MD

## 2022-06-01 NOTE — Assessment & Plan Note (Signed)
Chronic, significant weight loss since starting trulicity, didn't drop metformin dose yet.  A1c 5.4% - will drop metformin to 500mg  once daily, continue trulicity 0.75mg  weekly. Reassess at CPE.

## 2022-06-01 NOTE — Assessment & Plan Note (Signed)
Congratulated on weight loss to date. 20 lbs down since starting Trulicity, had lost some weight even prior. Continue healthy diet choices.

## 2022-06-04 ENCOUNTER — Other Ambulatory Visit: Payer: Self-pay

## 2022-06-06 ENCOUNTER — Telehealth: Payer: Self-pay

## 2022-06-07 ENCOUNTER — Other Ambulatory Visit: Payer: Self-pay

## 2022-06-08 ENCOUNTER — Other Ambulatory Visit: Payer: Self-pay

## 2022-06-08 NOTE — Telephone Encounter (Signed)
My chart sent to patient to let know Berkley Harvey has been approved.

## 2022-06-11 ENCOUNTER — Other Ambulatory Visit: Payer: Self-pay

## 2022-07-03 ENCOUNTER — Ambulatory Visit (INDEPENDENT_AMBULATORY_CARE_PROVIDER_SITE_OTHER): Payer: Medicare Other | Admitting: Family Medicine

## 2022-07-03 ENCOUNTER — Encounter: Payer: Self-pay | Admitting: Family Medicine

## 2022-07-03 ENCOUNTER — Other Ambulatory Visit: Payer: Self-pay

## 2022-07-03 ENCOUNTER — Telehealth: Payer: Self-pay

## 2022-07-03 VITALS — BP 124/62 | HR 69 | Temp 97.7°F | Ht 68.0 in | Wt 217.0 lb

## 2022-07-03 DIAGNOSIS — I10 Essential (primary) hypertension: Secondary | ICD-10-CM | POA: Diagnosis not present

## 2022-07-03 DIAGNOSIS — R5383 Other fatigue: Secondary | ICD-10-CM

## 2022-07-03 DIAGNOSIS — E1169 Type 2 diabetes mellitus with other specified complication: Secondary | ICD-10-CM

## 2022-07-03 DIAGNOSIS — Z7985 Long-term (current) use of injectable non-insulin antidiabetic drugs: Secondary | ICD-10-CM

## 2022-07-03 DIAGNOSIS — E785 Hyperlipidemia, unspecified: Secondary | ICD-10-CM

## 2022-07-03 DIAGNOSIS — Z7984 Long term (current) use of oral hypoglycemic drugs: Secondary | ICD-10-CM

## 2022-07-03 DIAGNOSIS — L989 Disorder of the skin and subcutaneous tissue, unspecified: Secondary | ICD-10-CM

## 2022-07-03 MED ORDER — GABAPENTIN 300 MG PO CAPS
300.0000 mg | ORAL_CAPSULE | Freq: Three times a day (TID) | ORAL | 1 refills | Status: DC
  Filled 2022-07-03: qty 270, 90d supply, fill #0

## 2022-07-03 MED ORDER — METFORMIN HCL 500 MG PO TABS
500.0000 mg | ORAL_TABLET | Freq: Every day | ORAL | 1 refills | Status: DC
  Filled 2022-07-03: qty 90, 90d supply, fill #0

## 2022-07-03 MED ORDER — LOSARTAN POTASSIUM 50 MG PO TABS
50.0000 mg | ORAL_TABLET | Freq: Every day | ORAL | 1 refills | Status: DC
  Filled 2022-07-03: qty 90, 90d supply, fill #0

## 2022-07-03 MED ORDER — TRULICITY 0.75 MG/0.5ML ~~LOC~~ SOAJ
0.7500 mg | SUBCUTANEOUS | 6 refills | Status: DC
  Filled 2022-07-03: qty 2, 28d supply, fill #0
  Filled 2022-07-31: qty 2, 28d supply, fill #1
  Filled 2022-08-31 (×2): qty 2, 28d supply, fill #2
  Filled 2022-10-02: qty 2, 28d supply, fill #3
  Filled 2022-10-30: qty 2, 28d supply, fill #4

## 2022-07-03 MED ORDER — ATORVASTATIN CALCIUM 40 MG PO TABS
40.0000 mg | ORAL_TABLET | Freq: Every day | ORAL | 1 refills | Status: DC
  Filled 2022-07-03: qty 90, 90d supply, fill #0

## 2022-07-03 NOTE — Patient Instructions (Addendum)
Let me know if you don't get a call about seeing Dr. Lorn Junes at [facility].  Take care.  Glad to see you. Let us know if you can't your meds filled at [facility].  Keep drinking enough water to keep your urine clear or light colored.

## 2022-07-03 NOTE — Progress Notes (Unsigned)
Lesion on L ear.  Present for about 2 years.  Had seen Dr. Lorn Junes at [facility].  It burns, is painful.  D/w pt about referral back to Dr. Lorn Junes.    Started not feeling well 06/30/22.  Fatigued in the meantime.  Sleeping more in the meantime.  Feels some better today but not back to baseline.  No FCNAVD.  "I felt drained."  No blood in stools, no blood in urine.  No clear trigger.  No exertional CP.  Not SOB.  No tick bites.  No rhinorrhea, no ST.    He was asking about getting his meds filled via [facility] pharmacy.  Refills sent.    Meds, vitals, and allergies reviewed.   ROS: Per HPI unless specifically indicated in ROS section   Nad Ncat Irritated cutaneous horn on the L pinna.   Neck supple, no LA Rrr Ctab Abd soft, not ttp Ext w/o edema.

## 2022-07-03 NOTE — Telephone Encounter (Signed)
Patient was in to see Dr. Para March this morning and was saying that he is having trouble pick up his medications. He said that they had been sent in but is having issues with [facility] getting them delivered as usual. Wanted to know if they could be sent back to [facility] today. He could only tell me about gabapentin and metformin but may need others.

## 2022-07-05 ENCOUNTER — Encounter: Payer: Self-pay | Admitting: *Deleted

## 2022-07-05 DIAGNOSIS — R5381 Other malaise: Secondary | ICD-10-CM | POA: Insufficient documentation

## 2022-07-05 DIAGNOSIS — R5383 Other fatigue: Secondary | ICD-10-CM | POA: Insufficient documentation

## 2022-07-05 NOTE — Telephone Encounter (Signed)
Noted. Looks like atorvastatin and losartan were also sent in.

## 2022-07-05 NOTE — Assessment & Plan Note (Addendum)
Refer back to Dr. Lorn Junes with derm.  Appreciate his input.

## 2022-07-05 NOTE — Assessment & Plan Note (Signed)
Of unclear source but he feels better today.  He could have had a transient viral illness.  His exam is benign and he is improving in the meantime so I think it makes sense to give him more time with observation.  Update [place] as needed in the meantime.  He agrees to plan.

## 2022-07-19 ENCOUNTER — Ambulatory Visit (INDEPENDENT_AMBULATORY_CARE_PROVIDER_SITE_OTHER): Payer: Medicare Other

## 2022-07-19 VITALS — Ht 69.0 in | Wt 220.0 lb

## 2022-07-19 DIAGNOSIS — Z0000 Encounter for general adult medical examination without abnormal findings: Secondary | ICD-10-CM

## 2022-07-19 NOTE — Patient Instructions (Signed)
Casey Golden , Thank you for taking time to come for your Medicare Wellness Visit. I appreciate your ongoing commitment to your health goals. Please review the following plan we discussed and let me know if I can assist you in the future.   These are the goals we discussed:  Goals      DIET - EAT MORE FRUITS AND VEGETABLES     Increase physical activity     Starting 05/21/2018, I will continue to walk 45-60 minutes daily.      Patient Stated     07/06/2020, I will continue to walk daily 6,000-8,000 steps daily.     Patient Stated     Continue to work and stay active.        This is a list of the screening recommended for you and due dates:  Health Maintenance  Topic Date Due   Yearly kidney health urinalysis for diabetes  07/15/2022   COVID-19 Vaccine (7 - 2023-24 season) 07/19/2022*   Flu Shot  08/30/2022   Yearly kidney function blood test for diabetes  11/29/2022   Hemoglobin A1C  12/02/2022   Complete foot exam   01/09/2023   Eye exam for diabetics  05/15/2023   Medicare Annual Wellness Visit  07/19/2023   Colon Cancer Screening  01/04/2025   DTaP/Tdap/Td vaccine (4 - Td or Tdap) 06/11/2028   Pneumonia Vaccine  Completed   Hepatitis C Screening  Completed   Zoster (Shingles) Vaccine  Completed   HPV Vaccine  Aged Out  *Topic was postponed. The date shown is not the original due date.    Advanced directives: none  Conditions/risks identified: Aim for 30 minutes of exercise or brisk walking, 6-8 glasses of water, and 5 servings of fruits and vegetables each day.   Next appointment: Follow up in one year for your annual wellness visit. 07/22/23 @ 1pm televisit  Preventive Care 65 Years and Older, Male  Preventive care refers to lifestyle choices and visits with your health care provider that can promote health and wellness. What does preventive care include? A yearly physical exam. This is also called an annual well check. Dental exams once or twice a year. Routine eye  exams. Ask your health care provider how often you should have your eyes checked. Personal lifestyle choices, including: Daily care of your teeth and gums. Regular physical activity. Eating a healthy diet. Avoiding tobacco and drug use. Limiting alcohol use. Practicing safe sex. Taking low doses of aspirin every day. Taking vitamin and mineral supplements as recommended by your health care provider. What happens during an annual well check? The services and screenings done by your health care provider during your annual well check will depend on your age, overall health, lifestyle risk factors, and family history of disease. Counseling  Your health care provider may ask you questions about your: Alcohol use. Tobacco use. Drug use. Emotional well-being. Home and relationship well-being. Sexual activity. Eating habits. History of falls. Memory and ability to understand (cognition). Work and work Astronomer. Screening  You may have the following tests or measurements: Height, weight, and BMI. Blood pressure. Lipid and cholesterol levels. These may be checked every 5 years, or more frequently if you are over 46 years old. Skin check. Lung cancer screening. You may have this screening every year starting at age 29 if you have a 30-pack-year history of smoking and currently smoke or have quit within the past 15 years. Fecal occult blood test (FOBT) of the stool. You may have  this test every year starting at age 67. Flexible sigmoidoscopy or colonoscopy. You may have a sigmoidoscopy every 5 years or a colonoscopy every 10 years starting at age 67. Prostate cancer screening. Recommendations will vary depending on your family history and other risks. Hepatitis C blood test. Hepatitis B blood test. Sexually transmitted disease (STD) testing. Diabetes screening. This is done by checking your blood sugar (glucose) after you have not eaten for a while (fasting). You may have this done every  1-3 years. Abdominal aortic aneurysm (AAA) screening. You may need this if you are a current or former smoker. Osteoporosis. You may be screened starting at age 43 if you are at high risk. Talk with your health care provider about your test results, treatment options, and if necessary, the need for more tests. Vaccines  Your health care provider may recommend certain vaccines, such as: Influenza vaccine. This is recommended every year. Tetanus, diphtheria, and acellular pertussis (Tdap, Td) vaccine. You may need a Td booster every 10 years. Zoster vaccine. You may need this after age 57. Pneumococcal 13-valent conjugate (PCV13) vaccine. One dose is recommended after age 56. Pneumococcal polysaccharide (PPSV23) vaccine. One dose is recommended after age 53. Talk to your health care provider about which screenings and vaccines you need and how often you need them. This information is not intended to replace advice given to you by your health care provider. Make sure you discuss any questions you have with your health care provider. Document Released: 02/11/2015 Document Revised: 10/05/2015 Document Reviewed: 11/16/2014 Elsevier Interactive Patient Education  2017 [employer].  Fall Prevention in the Home Falls can cause injuries. They can happen to people of all ages. There are many things you can do to make your home safe and to help prevent falls. What can I do on the outside of my home? Regularly fix the edges of walkways and driveways and fix any cracks. Remove anything that might make you trip as you walk through a door, such as a raised step or threshold. Trim any bushes or trees on the path to your home. Use bright outdoor lighting. Clear any walking paths of anything that might make someone trip, such as rocks or tools. Regularly check to see if handrails are loose or broken. Make sure that both sides of any steps have handrails. Any raised decks and porches should have guardrails on  the edges. Have any leaves, snow, or ice cleared regularly. Use sand or salt on walking paths during winter. Clean up any spills in your garage right away. This includes oil or grease spills. What can I do in the bathroom? Use night lights. Install grab bars by the toilet and in the tub and shower. Do not use towel bars as grab bars. Use non-skid mats or decals in the tub or shower. If you need to sit down in the shower, use a plastic, non-slip stool. Keep the floor dry. Clean up any water that spills on the floor as soon as it happens. Remove soap buildup in the tub or shower regularly. Attach bath mats securely with double-sided non-slip rug tape. Do not have throw rugs and other things on the floor that can make you trip. What can I do in the bedroom? Use night lights. Make sure that you have a light by your bed that is easy to reach. Do not use any sheets or blankets that are too big for your bed. They should not hang down onto the floor. Have a firm chair  that has side arms. You can use this for support while you get dressed. Do not have throw rugs and other things on the floor that can make you trip. What can I do in the kitchen? Clean up any spills right away. Avoid walking on wet floors. Keep items that you use a lot in easy-to-reach places. If you need to reach something above you, use a strong step stool that has a grab bar. Keep electrical cords out of the way. Do not use floor polish or wax that makes floors slippery. If you must use wax, use non-skid floor wax. Do not have throw rugs and other things on the floor that can make you trip. What can I do with my stairs? Do not leave any items on the stairs. Make sure that there are handrails on both sides of the stairs and use them. Fix handrails that are broken or loose. Make sure that handrails are as long as the stairways. Check any carpeting to make sure that it is firmly attached to the stairs. Fix any carpet that is loose  or worn. Avoid having throw rugs at the top or bottom of the stairs. If you do have throw rugs, attach them to the floor with carpet tape. Make sure that you have a light switch at the top of the stairs and the bottom of the stairs. If you do not have them, ask someone to add them for you. What else can I do to help prevent falls? Wear shoes that: Do not have high heels. Have rubber bottoms. Are comfortable and fit you well. Are closed at the toe. Do not wear sandals. If you use a stepladder: Make sure that it is fully opened. Do not climb a closed stepladder. Make sure that both sides of the stepladder are locked into place. Ask someone to hold it for you, if possible. Clearly mark and make sure that you can see: Any grab bars or handrails. First and last steps. Where the edge of each step is. Use tools that help you move around (mobility aids) if they are needed. These include: Canes. Walkers. Scooters. Crutches. Turn on the lights when you go into a dark area. Replace any light bulbs as soon as they burn out. Set up your furniture so you have a clear path. Avoid moving your furniture around. If any of your floors are uneven, fix them. If there are any pets around you, be aware of where they are. Review your medicines with your doctor. Some medicines can make you feel dizzy. This can increase your chance of falling. Ask your doctor what other things that you can do to help prevent falls. This information is not intended to replace advice given to you by your health care provider. Make sure you discuss any questions you have with your health care provider. Document Released: 11/11/2008 Document Revised: 06/23/2015 Document Reviewed: 02/19/2014 Elsevier Interactive Patient Education  2017 [employer].

## 2022-07-19 NOTE — Progress Notes (Signed)
Subjective:   Casey Golden is a 72 y.o. male who presents for Medicare Annual/Subsequent preventive examination.  Visit Complete: Virtual  I connected with  Casey Golden on 07/19/22 by a audio enabled telemedicine application and verified that I am speaking with the correct person using two identifiers.  Patient Location: Home  Provider Location: Home Office  I discussed the limitations of evaluation and management by telemedicine. The patient expressed understanding and agreed to proceed.    Review of Systems      Cardiac Risk Factors include: hypertension;diabetes mellitus;male gender;sedentary lifestyle;advanced age (>55men, >37 women)     Objective:    Today's Vitals   07/19/22 0945  Weight: 220 lb (99.8 kg)  Height: 5\' 9"  (1.753 m)   Body mass index is 32.49 kg/m.     07/19/2022   10:00 AM 11/28/2021   11:12 AM 07/17/2021   10:52 AM 04/17/2021    9:18 AM 02/01/2021    5:23 PM 12/07/2020    4:25 PM 11/16/2020    2:09 PM  Advanced Directives  Does Patient Have a Medical Advance Directive? No No No No No No No  Would patient like information on creating a medical advance directive? No - Patient declined No - Patient declined No - Patient declined        Current Medications (verified) Outpatient Encounter Medications as of 07/19/2022  Medication Sig   aspirin 81 MG EC tablet Take 1 tablet (81 mg total) by mouth daily.   atorvastatin (LIPITOR) 40 MG tablet Take 1 tablet (40 mg total) by mouth daily.   Blood Glucose Monitoring Suppl DEVI 1 each by Does not apply route in the morning, at noon, and at bedtime. May substitute to any manufacturer covered by patient's insurance.   Dulaglutide (TRULICITY) 0.75 MG/0.5ML SOPN Inject 0.75 mg into the skin once a week.   gabapentin (NEURONTIN) 300 MG capsule Take 1 capsule (300 mg total) by mouth 3 (three) times daily.   ibuprofen (ADVIL,MOTRIN) 200 MG tablet Take 200-600 mg by mouth every 8 (eight) hours as needed for  mild pain (severe back pain.).   losartan (COZAAR) 50 MG tablet Take 1 tablet (50 mg total) by mouth daily.   metFORMIN (GLUCOPHAGE) 500 MG tablet Take 1 tablet (500 mg total) by mouth daily with breakfast.   diclofenac sodium (VOLTAREN) 1 % GEL APPLY 2 G TOPICALLY 3 (THREE) TIMES DAILY AS NEEDED (ANTI INFLAMMATORY). (Patient not taking: Reported on 07/19/2022)   nitroGLYCERIN (NITROSTAT) 0.4 MG SL tablet Place 1 tablet (0.4 mg total) under the tongue every 5 (five) minutes as needed for chest pain (do not take more than 3 doses.).   tiZANidine (ZANAFLEX) 2 MG tablet Take 1 tablet (2 mg total) by mouth 2 (two) times daily as needed for muscle spasms. (Patient not taking: Reported on 07/19/2022)   No facility-administered encounter medications on file as of 07/19/2022.    Allergies (verified) Elemental sulfur, Imdur [isosorbide nitrate], and Sulfa antibiotics   History: Past Medical History:  Diagnosis Date   Arthritis    CAD (coronary artery disease)    cathx3 with nonobstructive disease. Last cath with RCA 40% stenosis in 2009   Cancer [facility])    Cataract 2019   bilateral; resolved with surgery   Colonic polyp    Fatty liver disease, nonalcoholic 2015   by [place]   GERD (gastroesophageal reflux disease)    History of chicken pox    Hyperlipidemia    Hypertension    Nocturia  OSA (obstructive sleep apnea)    CPAP, compliant   Paget's disease of bony pelvis    Past use of tobacco    Quit 1990, 70 pack year history   Rash of genital area    09-08-2014  per pt Dr Mena Goes aware   Seasonal and perennial allergic rhinitis    Type 2 diabetes mellitus (HCC)    Wears dentures    full upper/  partial lower   Wears glasses    Past Surgical History:  Procedure Laterality Date   ABDOMINAL HERNIA REPAIR  2007      [facility]   open repair   APPENDECTOMY  age 19   CARDIAC CATHETERIZATION  12-23-2007   [facility]   Abnormal myoview w/ ischemia/  40% mRCA with nonobstructive and no sig. plaque in his  left system, EF 55%   CARDIAC CATHETERIZATION  Apr 2008    [facility]   Abnormal myoview/  50% RCA,  ef 65%   CARDIAC CATHETERIZATION  1999      [facility]   CATARACT EXTRACTION, BILATERAL Bilateral 11/2017   CERVICAL FUSION  1992   CHOLECYSTECTOMY OPEN  2006   COLONOSCOPY WITH PROPOFOL N/A 04/17/2016   TAs, high grade dysplasia with margins clear, diverticulosis Midge Minium, MD)   COLONOSCOPY WITH PROPOFOL N/A 08/07/2016   TAx1, diverticulosis, rpt 3 yrs ([facility])   COLONOSCOPY WITH PROPOFOL N/A 01/05/2020   SSP, rpt Servando Snare, Darren, MD)   DENTAL SURGERY     metal dental implant L mandible   EXCISION OF SKIN TAG Right 09/14/2014   Procedure: EXCISION OF SKIN TAG;  Surgeon: Jerilee Field, MD;  Location: [facility];  Service: Urology;  Laterality: Right;   GROIN MASS OPEN BIOPSY Left 01/17/2019   HYDROCELE EXCISION Left 09/14/2014   Procedure: LEFT HYDROCELECTOMY ADULT;  Surgeon: Jerilee Field, MD;  [facility];  Service: Urology;  Laterality: Left;   LEFT HEART CATH AND CORONARY ANGIOGRAPHY Left 02/15/2021   Procedure: LEFT HEART CATH AND CORONARY ANGIOGRAPHY;  Surgeon: Yvonne Kendall, MD;  Location: [facility] INVASIVE CV LAB;  Service: Cardiovascular;  Laterality: Left;   MOHS SURGERY  2015   skin cancer   TONSILLECTOMY  age 63   Family History  Problem Relation Age of Onset   CAD Mother        MI   Diabetes Mother    Stroke Mother        mini-stroke   Cancer Father 43       lung (smoker)   Cancer Sister        lung   Heart disease Paternal Grandmother    Heart disease Paternal Grandfather    Diabetes Paternal Grandfather    Coronary artery disease Neg Hx        Premature   Sleep apnea Neg Hx    Social History   Socioeconomic History   Marital status: Married    Spouse name: Not on file   Number of children: 3   Years of education: Not on file   Highest education level: Not on file  Occupational History   Occupation: Full time     Employer: [employer]  Tobacco Use   Smoking status: Former    Packs/day: 2.00    Years: 35.00    Additional pack years: 0.00    Total pack years: 70.00    Types: Cigarettes    Quit date: 01/29/1990    Years since quitting: 32.4   Smokeless tobacco: Never  Vaping  Use   Vaping Use: Never used  Substance and Sexual Activity   Alcohol use: No   Drug use: No   Sexual activity: Yes  Other Topics Concern   Not on file  Social History Narrative   Lives with wife, dog and cats    Occupation: retired, was self employed, now works at [employer]    Edu: HS   Activity: walks 1.5 mi daily   Diet: some water, fruits/vegetables daily   Social Determinants of Health   Financial Resource Strain: Low Risk  (07/19/2022)   Overall Financial Resource Strain (CARDIA)    Difficulty of Paying Living Expenses: Not hard at all  Food Insecurity: No Food Insecurity (07/19/2022)   Hunger Vital Sign    Worried About Running Out of Food in the Last Year: Never true    Ran Out of Food in the Last Year: Never true  Transportation Needs: No Transportation Needs (07/19/2022)   PRAPARE - Administrator, Civil Service (Medical): No    Lack of Transportation (Non-Medical): No  Physical Activity: Insufficiently Active (07/19/2022)   Exercise Vital Sign    Days of Exercise per Week: 7 days    Minutes of Exercise per Session: 20 min  Stress: No Stress Concern Present (07/19/2022)   [employer] of Occupational Health - Occupational Stress Questionnaire    Feeling of Stress : Only a little  Social Connections: Moderately Integrated (07/19/2022)   Social Connection and Isolation Panel [NHANES]    Frequency of Communication with Friends and Family: More than three times a week    Frequency of Social Gatherings with Friends and Family: More than three times a week    Attends Religious Services: More than 4 times per year    Active Member of [employer] or Organizations: No    Attends Tax inspector Meetings: Never    Marital Status: Married  Recent Concern: Social Connections - Moderately Isolated (07/19/2022)   Social Connection and Isolation Panel [NHANES]    Frequency of Communication with Friends and Family: More than three times a week    Frequency of Social Gatherings with Friends and Family: More than three times a week    Attends Religious Services: Never    Database administrator or Organizations: No    Attends Engineer, structural: Never    Marital Status: Married    Tobacco Counseling Counseling given: Not Answered   Clinical Intake:  Pre-visit preparation completed: Yes  Pain : No/denies pain     Nutritional Risks: Non-healing wound (spot on left ear has a referral to dermatologist) Diabetes: Yes CBG done?: No Did pt. bring in CBG monitor from home?: No  How often do you need to have someone help you when you read instructions, pamphlets, or other written materials from your doctor or pharmacy?: 1 - Never  Interpreter Needed?: No  Information entered by :: C.Dagmar Adcox LPN   Activities of Daily Living    07/19/2022    8:03 AM  In your present state of health, do you have any difficulty performing the following activities:  Hearing? 0  Vision? 0  Difficulty concentrating or making decisions? 1  Comment occasionally forgets  Walking or climbing stairs? 1  Comment knee and back pain  Dressing or bathing? 0  Doing errands, shopping? 0  Preparing Food and eating ? N  Using the Toilet? N  In the past six months, have you accidently leaked urine? N  Do you have problems  with loss of bowel control? N  Managing your Medications? N  Managing your Finances? N  Housekeeping or managing your Housekeeping? N    Patient Care Team: Eustaquio Boyden, MD as PCP - General (Family Medicine) Mariah Milling Tollie Pizza, MD as PCP - Cardiology (Cardiology) Antonieta Iba, MD as Consulting Physician (Cardiology)  Indicate any recent Medical  Services you may have received from other than [facility] providers in the past year (date may be approximate).     Assessment:   This is a routine wellness examination for Casey Golden.  Hearing/Vision screen Hearing Screening - Comments:: Difficulty hearing - no aids Vision Screening - Comments:: Glasses - [facility]   Dietary issues and exercise activities discussed:     Goals Addressed             This Visit's Progress    Patient Stated       Continue to work and stay active.       Depression Screen    07/19/2022    9:57 AM 05/24/2022    3:05 PM 07/17/2021   10:48 AM 07/06/2020    1:22 PM 05/27/2019   11:01 AM 05/21/2018   12:31 PM 11/01/2017   10:45 AM  PHQ 2/9 Scores  PHQ - 2 Score 0 0 0 0 0 0 0  PHQ- 9 Score 0 0 0 0 3 0     Fall Risk    07/19/2022    9:52 AM 07/19/2022    8:03 AM 05/24/2022    3:05 PM 07/17/2021   10:55 AM 07/06/2020    1:19 PM  Fall Risk   Falls in the past year? 0 0 0 0 0  Number falls in past yr: 0 0 0 0 0  Injury with Fall? 0 0 0 0 0  Risk for fall due to : No Fall Risks  No Fall Risks No Fall Risks Medication side effect  Follow up Falls prevention discussed;Falls evaluation completed  Falls evaluation completed Falls evaluation completed Falls evaluation completed;Falls prevention discussed    MEDICARE RISK AT HOME:   Cognitive Function:    07/06/2020    1:27 PM 05/21/2018   12:31 PM 05/13/2017   10:10 AM  MMSE - Mini Mental State Exam  Orientation to time 5 5 5   Orientation to Place 5 5 5   Registration 3 3 3   Attention/ Calculation 5 0 0  Recall 3 2 2   Recall-comments  unable to recall 1 of 3 words unable to recall 1 of 3 words  Language- name 2 objects  0 0  Language- repeat 1 1 1   Language- follow 3 step command  0 1  Language- follow 3 step command-comments   unable to follow 1 step of 3 step command  Language- read & follow direction  0 0  Write a sentence  0 0  Copy design  0 0  Total score  16 17        07/19/2022    10:04 AM 07/17/2021   10:59 AM  6CIT Screen  What Year? 0 points 0 points  What month? 0 points 0 points  What time? 0 points 0 points  Count back from 20 0 points 0 points  Months in reverse 0 points 0 points  Repeat phrase 0 points 0 points  Total Score 0 points 0 points    Immunizations Immunization History  Administered Date(s) Administered   COVID-19, mRNA, vaccine(Comirnaty)12 years and older 11/01/2021   Influenza, High Dose Seasonal PF  11/30/2013, 09/15/2018, 09/03/2019, 10/26/2020, 10/30/2021   Influenza,inj,Quad PF,6+ Mos 10/15/2012, 09/30/2015, 09/06/2017   Influenza,inj,quad, With Preservative 10/29/2016   Influenza-Unspecified 11/01/2014, 10/13/2016   PFIZER Comirnaty(Gray Top)Covid-19 Tri-Sucrose Vaccine 07/08/2020   PFIZER(Purple Top)SARS-COV-2 Vaccination 04/02/2019, 04/23/2019, 11/29/2019   PNEUMOCOCCAL CONJUGATE-20 09/09/2020   Pfizer Covid-19 Vaccine Bivalent Booster 38yrs & up 02/09/2021   Pneumococcal Conjugate-13 09/05/2015   Pneumococcal Polysaccharide-23 01/30/2011, 10/18/2016, 04/18/2018   Respiratory Syncytial Virus Vaccine,Recomb Aduvanted(Arexvy) 10/30/2021   Td 10/27/2015   Tdap 10/27/2015, 06/12/2018   Zoster Recombinat (Shingrix) 09/13/2017, 12/15/2017   Zoster, Live 09/15/2012    TDAP status: Up to date  Flu Vaccine status: Up to date  Pneumococcal vaccine status: Up to date  Covid-19 vaccine status: Information provided on how to obtain vaccines.   Qualifies for Shingles Vaccine? Yes   Zostavax completed  unknown   Shingrix Completed?: Yes  Screening Tests Health Maintenance  Topic Date Due   Diabetic kidney evaluation - Urine ACR  07/15/2022   COVID-19 Vaccine (7 - 2023-24 season) 07/19/2022 (Originally 12/27/2021)   INFLUENZA VACCINE  08/30/2022   Diabetic kidney evaluation - eGFR measurement  11/29/2022   HEMOGLOBIN A1C  12/02/2022   FOOT EXAM  01/09/2023   OPHTHALMOLOGY EXAM  05/15/2023   Medicare Annual Wellness (AWV)   07/19/2023   Colonoscopy  01/04/2025   DTaP/Tdap/Td (4 - Td or Tdap) 06/11/2028   Pneumonia Vaccine 44+ Years old  Completed   Hepatitis C Screening  Completed   Zoster Vaccines- Shingrix  Completed   HPV VACCINES  Aged Out    Health Maintenance  Health Maintenance Due  Topic Date Due   Diabetic kidney evaluation - Urine ACR  07/15/2022    Colorectal cancer screening: Type of screening: Colonoscopy. Completed 01/05/20. Repeat every 5 years  Lung Cancer Screening: (Low Dose CT Chest recommended if Age 27-80 years, 20 pack-year currently smoking OR have quit w/in 15years.) does not qualify.   Lung Cancer Screening Referral: no  Additional Screening:  Hepatitis C Screening: does qualify; Completed 03/01/15  Vision Screening: Recommended annual ophthalmology exams for early detection of glaucoma and other disorders of the eye. Is the patient up to date with their annual eye exam?  Yes  Who is the provider or what is the name of the office in which the patient attends annual eye exams? [facility] If pt is not established with a provider, would they like to be referred to a provider to establish care? Yes .   Dental Screening: Recommended annual dental exams for proper oral hygiene  Diabetic Foot Exam: Diabetic Foot Exam: Completed 01/08/22 pcp  Community Resource Referral / Chronic Care Management: CRR required this visit?  No   CCM required this visit?  No     Plan:     I have personally reviewed and noted the following in the patient's chart:   Medical and social history Use of alcohol, tobacco or illicit drugs  Current medications and supplements including opioid prescriptions. Patient is not currently taking opioid prescriptions. Functional ability and status Nutritional status Physical activity Advanced directives List of other physicians Hospitalizations, surgeries, and ER visits in previous 12 months Vitals Screenings to include cognitive,  depression, and falls Referrals and appointments  In addition, I have reviewed and discussed with patient certain preventive protocols, quality metrics, and best practice recommendations. A written personalized care plan for preventive services as well as general preventive health recommendations were provided to patient.     Sibyl Mikula Linus Orn, LPN  07/19/2022   After Visit Summary: (MyChart) Due to this being a telephonic visit, the after visit summary with patients personalized plan was offered to patient via MyChart   Nurse Notes: none

## 2022-07-31 ENCOUNTER — Other Ambulatory Visit: Payer: Self-pay

## 2022-08-07 DIAGNOSIS — D0422 Carcinoma in situ of skin of left ear and external auricular canal: Secondary | ICD-10-CM | POA: Diagnosis not present

## 2022-08-07 DIAGNOSIS — Z85828 Personal history of other malignant neoplasm of skin: Secondary | ICD-10-CM | POA: Diagnosis not present

## 2022-08-07 DIAGNOSIS — D492 Neoplasm of unspecified behavior of bone, soft tissue, and skin: Secondary | ICD-10-CM | POA: Diagnosis not present

## 2022-08-12 ENCOUNTER — Observation Stay
Admission: EM | Admit: 2022-08-12 | Discharge: 2022-08-13 | Disposition: A | Discharge status: Home / Self Care | Payer: Medicare Other | Source: Home / Self Care | Attending: Emergency Medicine | Admitting: Emergency Medicine

## 2022-08-12 ENCOUNTER — Observation Stay: Payer: Medicare Other

## 2022-08-12 ENCOUNTER — Other Ambulatory Visit: Payer: Self-pay

## 2022-08-12 ENCOUNTER — Encounter: Payer: Self-pay | Admitting: Radiology

## 2022-08-12 ENCOUNTER — Emergency Department: Payer: Medicare Other

## 2022-08-12 DIAGNOSIS — Z7984 Long term (current) use of oral hypoglycemic drugs: Secondary | ICD-10-CM | POA: Diagnosis not present

## 2022-08-12 DIAGNOSIS — Z7985 Long-term (current) use of injectable non-insulin antidiabetic drugs: Secondary | ICD-10-CM | POA: Diagnosis not present

## 2022-08-12 DIAGNOSIS — Z8673 Personal history of transient ischemic attack (TIA), and cerebral infarction without residual deficits: Secondary | ICD-10-CM | POA: Diagnosis not present

## 2022-08-12 DIAGNOSIS — R29818 Other symptoms and signs involving the nervous system: Secondary | ICD-10-CM | POA: Diagnosis not present

## 2022-08-12 DIAGNOSIS — J449 Chronic obstructive pulmonary disease, unspecified: Secondary | ICD-10-CM | POA: Diagnosis not present

## 2022-08-12 DIAGNOSIS — Z471 Aftercare following joint replacement surgery: Secondary | ICD-10-CM | POA: Diagnosis not present

## 2022-08-12 DIAGNOSIS — E66811 Obesity, class 1: Secondary | ICD-10-CM | POA: Diagnosis present

## 2022-08-12 DIAGNOSIS — E785 Hyperlipidemia, unspecified: Secondary | ICD-10-CM | POA: Diagnosis present

## 2022-08-12 DIAGNOSIS — Z7982 Long term (current) use of aspirin: Secondary | ICD-10-CM | POA: Diagnosis not present

## 2022-08-12 DIAGNOSIS — R42 Dizziness and giddiness: Principal | ICD-10-CM

## 2022-08-12 DIAGNOSIS — Z79899 Other long term (current) drug therapy: Secondary | ICD-10-CM | POA: Insufficient documentation

## 2022-08-12 DIAGNOSIS — Z87891 Personal history of nicotine dependence: Secondary | ICD-10-CM | POA: Diagnosis not present

## 2022-08-12 DIAGNOSIS — J432 Centrilobular emphysema: Secondary | ICD-10-CM | POA: Diagnosis present

## 2022-08-12 DIAGNOSIS — E1169 Type 2 diabetes mellitus with other specified complication: Secondary | ICD-10-CM | POA: Diagnosis not present

## 2022-08-12 DIAGNOSIS — E119 Type 2 diabetes mellitus without complications: Secondary | ICD-10-CM

## 2022-08-12 DIAGNOSIS — R531 Weakness: Secondary | ICD-10-CM | POA: Insufficient documentation

## 2022-08-12 DIAGNOSIS — I2510 Atherosclerotic heart disease of native coronary artery without angina pectoris: Secondary | ICD-10-CM | POA: Diagnosis not present

## 2022-08-12 DIAGNOSIS — G9389 Other specified disorders of brain: Secondary | ICD-10-CM | POA: Diagnosis not present

## 2022-08-12 DIAGNOSIS — I10 Essential (primary) hypertension: Secondary | ICD-10-CM | POA: Diagnosis not present

## 2022-08-12 DIAGNOSIS — E669 Obesity, unspecified: Secondary | ICD-10-CM | POA: Diagnosis present

## 2022-08-12 DIAGNOSIS — R519 Headache, unspecified: Secondary | ICD-10-CM | POA: Diagnosis not present

## 2022-08-12 DIAGNOSIS — I6782 Cerebral ischemia: Secondary | ICD-10-CM | POA: Diagnosis not present

## 2022-08-12 DIAGNOSIS — G4733 Obstructive sleep apnea (adult) (pediatric): Secondary | ICD-10-CM | POA: Diagnosis present

## 2022-08-12 LAB — ETHANOL: Alcohol, Ethyl (B): <10 mg/dL (ref <10)

## 2022-08-12 LAB — URINALYSIS, ROUTINE W REFLEX MICROSCOPIC
Bilirubin Urine: NEGATIVE
Glucose, UA: 50 mg/dL — AB
Hgb urine dipstick: NEGATIVE
Ketones, ur: NEGATIVE mg/dL
Nitrite: NEGATIVE
Protein, ur: NEGATIVE mg/dL
Specific Gravity, Urine: 1.018 (ref 1.005–1.030)
Squamous Epithelial / HPF: NONE SEEN /HPF (ref 0–5)
pH: 7 (ref 5.0–8.0)

## 2022-08-12 LAB — COMPREHENSIVE METABOLIC PANEL
ALT: 32 U/L (ref 0–44)
AST: 35 U/L (ref 15–41)
Albumin: 4.3 g/dL (ref 3.5–5.0)
Alkaline Phosphatase: 57 U/L (ref 38–126)
Anion gap: 10 (ref 5–15)
BUN: 23 mg/dL (ref 8–23)
CO2: 27 mmol/L (ref 22–32)
Calcium: 9.2 mg/dL (ref 8.9–10.3)
Chloride: 101 mmol/L (ref 98–111)
Creatinine, Ser: 0.77 mg/dL (ref 0.61–1.24)
GFR, Estimated: >60 mL/min (ref >60)
Glucose, Bld: 132 mg/dL — ABNORMAL HIGH (ref 70–99)
Potassium: 4 mmol/L (ref 3.5–5.1)
Sodium: 138 mmol/L (ref 135–145)
Total Bilirubin: 1.1 mg/dL (ref 0.3–1.2)
Total Protein: 7.6 g/dL (ref 6.5–8.1)

## 2022-08-12 LAB — CBC
HCT: 46.4 % (ref 39.0–52.0)
Hemoglobin: 16.3 g/dL (ref 13.0–17.0)
MCH: 34 pg (ref 26.0–34.0)
MCHC: 35.1 g/dL (ref 30.0–36.0)
MCV: 96.9 fL (ref 80.0–100.0)
Platelets: 233 10*3/uL (ref 150–400)
RBC: 4.79 MIL/uL (ref 4.22–5.81)
RDW: 11.7 % (ref 11.5–15.5)
WBC: 10.4 10*3/uL (ref 4.0–10.5)
nRBC: 0 % (ref 0.0–0.2)

## 2022-08-12 LAB — DIFFERENTIAL
Abs Immature Granulocytes: 0.05 10*3/uL (ref 0.00–0.07)
Basophils Absolute: 0.1 10*3/uL (ref 0.0–0.1)
Basophils Relative: 1 %
Eosinophils Absolute: 0.2 10*3/uL (ref 0.0–0.5)
Eosinophils Relative: 2 %
Immature Granulocytes: 1 %
Lymphocytes Relative: 28 %
Lymphs Abs: 2.9 10*3/uL (ref 0.7–4.0)
Monocytes Absolute: 0.6 10*3/uL (ref 0.1–1.0)
Monocytes Relative: 6 %
Neutro Abs: 6.6 10*3/uL (ref 1.7–7.7)
Neutrophils Relative %: 62 %

## 2022-08-12 LAB — PROTIME-INR
INR: 1.1 (ref 0.8–1.2)
Prothrombin Time: 13.9 seconds (ref 11.4–15.2)

## 2022-08-12 LAB — APTT: aPTT: 27 seconds (ref 24–36)

## 2022-08-12 LAB — CBG MONITORING, ED: Glucose-Capillary: 117 mg/dL — ABNORMAL HIGH (ref 70–99)

## 2022-08-12 LAB — GLUCOSE, CAPILLARY
Glucose-Capillary: 158 mg/dL — ABNORMAL HIGH (ref 70–99)
Glucose-Capillary: 118 mg/dL — ABNORMAL HIGH (ref 70–99)

## 2022-08-12 MED ORDER — GABAPENTIN 300 MG PO CAPS
300.0000 mg | ORAL_CAPSULE | Freq: Three times a day (TID) | ORAL | Status: DC
  Administered 2022-08-12 – 2022-08-13 (×3): 300 mg via ORAL
  Filled 2022-08-12 (×3): qty 1

## 2022-08-12 MED ORDER — HYDRALAZINE HCL 20 MG/ML IJ SOLN: 5.0000 mg | INTRAMUSCULAR | Status: DC | PRN

## 2022-08-12 MED ORDER — LOSARTAN POTASSIUM 50 MG PO TABS
50.0000 mg | ORAL_TABLET | Freq: Every day | ORAL | Status: DC
  Administered 2022-08-13: 50 mg via ORAL

## 2022-08-12 MED ORDER — SODIUM CHLORIDE 0.9 % IV BOLUS
500.0000 mL | Freq: Once | INTRAVENOUS | Status: DC
Start: 2022-08-12 — End: 2022-08-12

## 2022-08-12 MED ORDER — SODIUM CHLORIDE 0.9 % IV SOLN: INTRAVENOUS | Status: DC

## 2022-08-12 MED ORDER — ONDANSETRON HCL 4 MG/2ML IJ SOLN: 4.0000 mg | Freq: Three times a day (TID) | INTRAMUSCULAR | Status: DC | PRN

## 2022-08-12 MED ORDER — ACETAMINOPHEN 325 MG PO TABS: 650.0000 mg | ORAL_TABLET | ORAL | Status: DC | PRN

## 2022-08-12 MED ORDER — STROKE: EARLY STAGES OF RECOVERY BOOK: Freq: Once | Status: AC

## 2022-08-12 MED ORDER — ENOXAPARIN SODIUM 60 MG/0.6ML IJ SOSY
0.5000 mg/kg | PREFILLED_SYRINGE | INTRAMUSCULAR | Status: DC
  Administered 2022-08-12: 50 mg via SUBCUTANEOUS
  Filled 2022-08-12: qty 0.6

## 2022-08-12 MED ORDER — ASPIRIN 81 MG PO TBEC
81.0000 mg | DELAYED_RELEASE_TABLET | Freq: Every day | ORAL | Status: DC
  Administered 2022-08-13: 81 mg via ORAL
  Filled 2022-08-12: qty 1

## 2022-08-12 MED ORDER — NITROGLYCERIN 0.4 MG SL SUBL: 0.4000 mg | SUBLINGUAL_TABLET | SUBLINGUAL | Status: DC | PRN

## 2022-08-12 MED ORDER — ASPIRIN 81 MG PO CHEW
324.0000 mg | CHEWABLE_TABLET | Freq: Once | ORAL | Status: AC
  Administered 2022-08-12: 324 mg via ORAL
  Filled 2022-08-12: qty 4

## 2022-08-12 MED ORDER — ALBUTEROL SULFATE (2.5 MG/3ML) 0.083% IN NEBU: 2.5000 mg | INHALATION_SOLUTION | RESPIRATORY_TRACT | Status: DC | PRN

## 2022-08-12 MED ORDER — DM-GUAIFENESIN ER 30-600 MG PO TB12: 1.0000 | ORAL_TABLET | Freq: Two times a day (BID) | ORAL | Status: DC | PRN

## 2022-08-12 MED ORDER — ACETAMINOPHEN 650 MG RE SUPP: 650.0000 mg | RECTAL | Status: DC | PRN

## 2022-08-12 MED ORDER — SENNOSIDES-DOCUSATE SODIUM 8.6-50 MG PO TABS: 1.0000 | ORAL_TABLET | Freq: Every evening | ORAL | Status: DC | PRN

## 2022-08-12 MED ORDER — INSULIN ASPART 100 UNIT/ML IJ SOLN
0.0000 [IU] | Freq: Three times a day (TID) | INTRAMUSCULAR | Status: DC
  Administered 2022-08-12: 2 [IU] via SUBCUTANEOUS
  Filled 2022-08-12: qty 1

## 2022-08-12 MED ORDER — ACETAMINOPHEN 160 MG/5ML PO SOLN: 650.0000 mg | ORAL | Status: DC | PRN

## 2022-08-12 MED ORDER — MECLIZINE HCL 25 MG PO TABS: 25.0000 mg | ORAL_TABLET | Freq: Three times a day (TID) | ORAL | Status: DC | PRN

## 2022-08-12 MED ORDER — ATORVASTATIN CALCIUM 20 MG PO TABS
40.0000 mg | ORAL_TABLET | Freq: Every day | ORAL | Status: DC
  Administered 2022-08-13: 40 mg via ORAL
  Filled 2022-08-12: qty 2

## 2022-08-12 MED ORDER — INSULIN ASPART 100 UNIT/ML IJ SOLN
0.0000 [IU] | Freq: Every day | INTRAMUSCULAR | Status: DC
  Filled 2022-08-12: qty 1

## 2022-08-12 NOTE — ED Triage Notes (Signed)
Pt reports when he woke up this am at 0600 he was dizzy and was not able to stand up to go to the bathroom. Pt sat on the floor and then crawled to the bathroom. Pt also reports his head hurts a little and feels funny and he feels nauseated. Left side of pt face appears to be drooping, per pt wife this is normal for him. Pt reports his dizziness it not has bad as it was but he still feels off.

## 2022-08-12 NOTE — H&P (Signed)
History and Physical    Casey Golden:[id number] DOB: Jan 17, 1951 DOA: 08/12/2022  Referring MD/NP/PA:   PCP: Eustaquio Boyden, MD   Patient coming from:  The patient is coming from home.     Chief Complaint: dizziness and weakness  HPI: Casey Golden is a 72 y.o. male with medical history significant of hypertension, hyperlipidemia, diabetes mellitus, CAD, GERD, obesity, OSA on CPAP, former smoker, who presents with dizziness and weakness.  Pt states that he woke up around 6:00 this morning with a sensation of pressure on the top of his head and dizziness. He has little feeling of room spinning around him, no ear ringing or hearing loss.  He has generalized weakness.  He states that he felt so weak that he had difficulty walking and had to crawl to the bathroom.  He denies unilateral numbness or tinglings in extremities.  No slurred speech. Left side of pt face appears to be drooping, but per pt wife this is normal for him. Patient does not have chest pain, cough and SOB.  He has nausea, no vomiting, diarrhea or abdominal pain.  Denies symptoms of UTI.  Data reviewed independently and ED Course: pt was found to have WBC 10.4, INR 1.1, GFR> 60, temperature normal, blood pressure 140/70, heart rate 62, RR 20, oxygen saturation 96% on room air.  CT of head is negative for acute intracranial abnormalities.  Patient is placed on telemetry bed for observation.   EKG: I have personally reviewed.  Sinus rhythm, QTc 427, low voltage, no ischemic change.   Review of Systems:   General: no fevers, chills, no body weight gain, has fatigue HEENT: no blurry vision, hearing changes or sore throat Respiratory: no dyspnea, coughing, wheezing CV: no chest pain, no palpitations GI: no nausea, vomiting, abdominal pain, diarrhea, constipation GU: no dysuria, burning on urination, increased urinary frequency, hematuria  Ext: no leg edema Neuro: no unilateral weakness, numbness, or tingling, no  vision change or hearing loss. Has dizziness Skin: no rash, no skin tear. MSK: No muscle spasm, no deformity, no limitation of range of movement in spin Heme: No easy bruising.  Travel history: No recent long distant travel.   Allergy:  Allergies  Allergen Reactions   Elemental Sulfur    Imdur [Isosorbide Nitrate]     H/A, "Sick", could not get out of bed   Sulfa Antibiotics Hives    Past Medical History:  Diagnosis Date   Arthritis    CAD (coronary artery disease)    cathx3 with nonobstructive disease. Last cath with RCA 40% stenosis in 2009   Cancer [facility])    Cataract 2019   bilateral; resolved with surgery   Colonic polyp    Fatty liver disease, nonalcoholic 2015   by [place]   GERD (gastroesophageal reflux disease)    History of chicken pox    Hyperlipidemia    Hypertension    Nocturia    OSA (obstructive sleep apnea)    CPAP, compliant   Paget's disease of bony pelvis    Past use of tobacco    Quit 1990, 70 pack year history   Rash of genital area    09-08-2014  per pt Dr Mena Goes aware   Seasonal and perennial allergic rhinitis    Type 2 diabetes mellitus (HCC)    Wears dentures    full upper/  partial lower   Wears glasses     Past Surgical History:  Procedure Laterality Date   ABDOMINAL HERNIA REPAIR  2007  [facility]   open repair   APPENDECTOMY  age 47   CARDIAC CATHETERIZATION  12-23-2007   [facility]   Abnormal myoview w/ ischemia/  40% mRCA with nonobstructive and no sig. plaque in his left system, EF 55%   CARDIAC CATHETERIZATION  Apr 2008    [facility]   Abnormal myoview/  50% RCA,  ef 65%   CARDIAC CATHETERIZATION  1999      [facility]   CATARACT EXTRACTION, BILATERAL Bilateral 11/2017   CERVICAL FUSION  1992   CHOLECYSTECTOMY OPEN  2006   COLONOSCOPY WITH PROPOFOL N/A 04/17/2016   TAs, high grade dysplasia with margins clear, diverticulosis Midge Minium, MD)   COLONOSCOPY WITH PROPOFOL N/A 08/07/2016   TAx1, diverticulosis, rpt 3 yrs ([facility])   COLONOSCOPY WITH  PROPOFOL N/A 01/05/2020   SSP, rpt Servando Snare, Darren, MD)   DENTAL SURGERY     metal dental implant L mandible   EXCISION OF SKIN TAG Right 09/14/2014   Procedure: EXCISION OF SKIN TAG;  Surgeon: Jerilee Field, MD;  Location: [facility];  Service: Urology;  Laterality: Right;   GROIN MASS OPEN BIOPSY Left 01/17/2019   HYDROCELE EXCISION Left 09/14/2014   Procedure: LEFT HYDROCELECTOMY ADULT;  Surgeon: Jerilee Field, MD;  Location: [facility];  Service: Urology;  Laterality: Left;   LEFT HEART CATH AND CORONARY ANGIOGRAPHY Left 02/15/2021   Procedure: LEFT HEART CATH AND CORONARY ANGIOGRAPHY;  Surgeon: Yvonne Kendall, MD;  Location: [facility] INVASIVE CV LAB;  Service: Cardiovascular;  Laterality: Left;   MOHS SURGERY  2015   skin cancer   TONSILLECTOMY  age 60    Social History:  reports that he quit smoking about 32 years ago. His smoking use included cigarettes. He started smoking about 67 years ago. He has a 70 pack-year smoking history. He has never used smokeless tobacco. He reports that he does not drink alcohol and does not use drugs.  Family History:  Family History  Problem Relation Age of Onset   CAD Mother        MI   Diabetes Mother    Stroke Mother        mini-stroke   Cancer Father 78       lung (smoker)   Cancer Sister        lung   Heart disease Paternal Grandmother    Heart disease Paternal Grandfather    Diabetes Paternal Grandfather    Coronary artery disease Neg Hx        Premature   Sleep apnea Neg Hx      Prior to Admission medications   Medication Sig Start Date End Date Taking? Authorizing Provider  aspirin 81 MG EC tablet Take 1 tablet (81 mg total) by mouth daily. 09/17/14   Jerilee Field, MD  atorvastatin (LIPITOR) 40 MG tablet Take 1 tablet (40 mg total) by mouth daily. 07/03/22   Joaquim Nam, MD  Blood Glucose Monitoring Suppl DEVI 1 each by Does not apply route in the morning, at noon, and at bedtime. May  substitute to any manufacturer covered by patient's insurance. 06/01/22   Eustaquio Boyden, MD  diclofenac sodium (VOLTAREN) 1 % GEL APPLY 2 G TOPICALLY 3 (THREE) TIMES DAILY AS NEEDED (ANTI INFLAMMATORY). Patient not taking: Reported on 07/19/2022 07/24/17   Eustaquio Boyden, MD  Dulaglutide (TRULICITY) 0.75 MG/0.5ML SOPN Inject 0.75 mg into the skin once a week. 07/03/22   Joaquim Nam, MD  gabapentin (NEURONTIN) 300 MG capsule Take 1 capsule (300  mg total) by mouth 3 (three) times daily. 07/03/22   Joaquim Nam, MD  ibuprofen (ADVIL,MOTRIN) 200 MG tablet Take 200-600 mg by mouth every 8 (eight) hours as needed for mild pain (severe back pain.).    [provider]  losartan (COZAAR) 50 MG tablet Take 1 tablet (50 mg total) by mouth daily. 07/03/22   Joaquim Nam, MD  metFORMIN (GLUCOPHAGE) 500 MG tablet Take 1 tablet (500 mg total) by mouth daily with breakfast. 07/03/22   Joaquim Nam, MD  nitroGLYCERIN (NITROSTAT) 0.4 MG SL tablet Place 1 tablet (0.4 mg total) under the tongue every 5 (five) minutes as needed for chest pain (do not take more than 3 doses.). 02/08/21 06/01/22  Eustaquio Boyden, MD  tiZANidine (ZANAFLEX) 2 MG tablet Take 1 tablet (2 mg total) by mouth 2 (two) times daily as needed for muscle spasms. Patient not taking: Reported on 07/19/2022 12/26/21   Eden Emms, NP    Physical Exam: Vitals:   08/12/22 1317 08/12/22 1320 08/12/22 1631  BP:  (!) 140/70 (!) 174/71  Pulse:  62 61  Resp:  20 18  Temp:  97.8 F (36.6 C)   TempSrc:  Oral   SpO2:  96% 100%  Weight: 99 kg    Height: 5\' 9"  (1.753 m)     General: Not in acute distress HEENT:       Eyes: PERRL, EOMI, no jaundice       ENT: No discharge from the ears and nose, no pharynx injection, no tonsillar enlargement.        Neck: No JVD, no bruit, no mass felt. Heme: No neck lymph node enlargement. Cardiac: S1/S2, RRR, No murmurs, No gallops or rubs. Respiratory: No rales, wheezing, rhonchi or  rubs. GI: Soft, nondistended, nontender, no rebound pain, no organomegaly, BS present. GU: No hematuria Ext: No pitting leg edema bilaterally. 1+DP/PT pulse bilaterally. Musculoskeletal: No joint deformities, No joint redness or warmth, no limitation of ROM in spin. Skin: No rashes.  Neuro: Alert, oriented X3, cranial nerves II-XII grossly intact, moves all extremities. His right leg is minimally weaker than the left leg.  Both arms has normal muscle strength. Sensation to light touch intact.  Psych: Patient is not psychotic, no suicidal or hemocidal ideation.  Labs on Admission: I have personally reviewed following labs and imaging studies  CBC: Recent Labs  Lab 08/12/22 1320  WBC 10.4  NEUTROABS 6.6  HGB 16.3  HCT 46.4  MCV 96.9  PLT 233   Basic Metabolic Panel: Recent Labs  Lab 08/12/22 1320  NA 138  K 4.0  CL 101  CO2 27  GLUCOSE 132*  BUN 23  CREATININE 0.77  CALCIUM 9.2   GFR: Estimated Creatinine Clearance: 94 mL/min (by C-G formula based on SCr of 0.77 mg/dL). Liver Function Tests: Recent Labs  Lab 08/12/22 1320  AST 35  ALT 32  ALKPHOS 57  BILITOT 1.1  PROT 7.6  ALBUMIN 4.3   No results for input(s): "LIPASE", "AMYLASE" in the last 168 hours. No results for input(s): "AMMONIA" in the last 168 hours. Coagulation Profile: Recent Labs  Lab 08/12/22 1320  INR 1.1   Cardiac Enzymes: No results for input(s): "CKTOTAL", "CKMB", "CKMBINDEX", "TROPONINI" in the last 168 hours. BNP (last 3 results) No results for input(s): "PROBNP" in the last 8760 hours. HbA1C: No results for input(s): "HGBA1C" in the last 72 hours. CBG: Recent Labs  Lab 08/12/22 1321  GLUCAP 117*   Lipid Profile: No  results for input(s): "CHOL", "HDL", "LDLCALC", "TRIG", "CHOLHDL", "LDLDIRECT" in the last 72 hours. Thyroid Function Tests: No results for input(s): "TSH", "T4TOTAL", "FREET4", "T3FREE", "THYROIDAB" in the last 72 hours. Anemia Panel: No results for input(s):  "VITAMINB12", "FOLATE", "FERRITIN", "TIBC", "IRON", "RETICCTPCT" in the last 72 hours. Urine analysis:    Component Value Date/Time   COLORURINE YELLOW (A) 08/12/2022 1600   APPEARANCEUR HAZY (A) 08/12/2022 1600   APPEARANCEUR CLEAR 12/05/2013 0904   LABSPEC 1.018 08/12/2022 1600   LABSPEC 1.020 12/05/2013 0904   PHURINE 7.0 08/12/2022 1600   GLUCOSEU 50 (A) 08/12/2022 1600   GLUCOSEU 250 mg/dL 40/98/1191 4782   HGBUR NEGATIVE 08/12/2022 1600   BILIRUBINUR NEGATIVE 08/12/2022 1600   BILIRUBINUR negative 12/26/2021 1021   BILIRUBINUR NEGATIVE 12/05/2013 0904   KETONESUR NEGATIVE 08/12/2022 1600   PROTEINUR NEGATIVE 08/12/2022 1600   UROBILINOGEN 0.2 12/26/2021 1021   NITRITE NEGATIVE 08/12/2022 1600   LEUKOCYTESUR TRACE (A) 08/12/2022 1600   LEUKOCYTESUR NEGATIVE 12/05/2013 0904   Sepsis Labs: @LABRCNTIP (procalcitonin:4,lacticidven:4) )No results found for this or any previous visit (from the past 240 hour(s)).   Radiological Exams on Admission: CT HEAD WO CONTRAST  Result Date: 08/12/2022 CLINICAL DATA:  Headache, neuro deficit EXAM: CT HEAD WITHOUT CONTRAST TECHNIQUE: Contiguous axial images were obtained from the base of the skull through the vertex without intravenous contrast. RADIATION DOSE REDUCTION: This exam was performed according to the departmental dose-optimization program which includes automated exposure control, adjustment of the mA and/or kV according to patient size and/or use of iterative reconstruction technique. COMPARISON:  11/28/2021 FINDINGS: Brain: No evidence of acute infarction, hemorrhage, hydrocephalus, extra-axial collection or mass lesion/mass effect. Scattered low-density changes within the periventricular and subcortical white matter most compatible with chronic microvascular ischemic change. Mild diffuse cerebral volume loss. Vascular: Atherosclerotic calcifications involving the large vessels of the skull base. No unexpected hyperdense vessel. Skull:  Normal. Negative for fracture or focal lesion. Sinuses/Orbits: No acute finding. Other: None. IMPRESSION: 1. No acute intracranial findings. 2. Chronic microvascular ischemic change and cerebral volume loss. Electronically Signed   By: Duanne Guess D.O.   On: 08/12/2022 14:06      Assessment/Plan Principal Problem:   Dizziness Active Problems:   HTN (hypertension)   CAD (coronary artery disease), native coronary artery   Dyslipidemia associated with type 2 diabetes mellitus (HCC)   Diabetes mellitus without complication (HCC)   Centrilobular emphysema (HCC)   OSA (obstructive sleep apnea)   Obesity, Class I, BMI 30-34.9   Assessment and Plan:  Dizziness: Etiology is not clear.  Patient reports dizziness, and seems to have minimal right leg weakness on examination.  The differential diagnosis include peripheral vertigo versus stroke.  CT head negative.  Will get MRI of brain, if it is positive for stroke, will do full stroke workup.  - Placed on tele med bed for observation - As needed meclizine - IV fluid: 75 cc/h of normal saline - Check orthostatic vital signs - Obtain MRI-brain  - Start ASA empirically -Continue Lipitor - fasting lipid panel and HbA1c  - swallowing screen. If fails, will get SLP - fall precaution - PT/OT consult  HTN (hypertension): -IV hydralazine as needed -Cozaar  CAD (coronary artery disease), native coronary artery: No chest pain -Aspirin and Lipitor  Dyslipidemia associated with type 2 diabetes mellitus (HCC) -Lipitor  Diabetes mellitus without complication (HCC): Recent A1c 5.4, well-controlled.  Patient taking metformin and Trulicity -Sliding scale insulin  Centrilobular emphysema (HCC): Stable -As needed albuterol and Mucinex  OSA (obstructive sleep apnea) -CPAP  Obesity: Body weight 99 kg, BMI 32.23 -Encourage losing weight, exercise and healthy diet      DVT ppx:  SQ Lovenox  Code Status: Full code     Family  Communication:  Yes, patient's wife  at bed side.    Disposition Plan:  Anticipate discharge back to previous environment  Consults called:  none  Admission status and Level of care: Telemetry Medical: for obs    Dispo: The patient is from: Home              Anticipated d/c is to: Home              Anticipated d/c date is: 1 day              Patient currently is not medically stable to d/c.    Severity of Illness:  The appropriate patient status for this patient is OBSERVATION. Observation status is judged to be reasonable and necessary in order to provide the required intensity of service to ensure the patient's safety. The patient's presenting symptoms, physical exam findings, and initial radiographic and laboratory data in the context of their medical condition is felt to place them at decreased risk for further clinical deterioration. Furthermore, it is anticipated that the patient will be medically stable for discharge from the hospital within 2 midnights of admission.        Date of Service 08/12/2022    Lorretta Harp [facility]   If 7PM-7AM, please contact night-coverage [email or web address] 08/12/2022, 5:14 PM

## 2022-08-12 NOTE — ED Provider Notes (Signed)
[facility] Provider Note    Event Date/Time   First MD Initiated Contact with Patient 08/12/22 1537     (approximate)   History   Chief Complaint Dizziness and Headache   HPI  Casey Golden is a 72 y.o. male with past medical history of hypertension, diabetes, CAD, COPD, and stroke presents to the ED complaining of dizziness and headache.  Patient reports that he woke up around 6:00 this morning with a sensation of pressure on the top of his head, states this does not feel like a typical headache for him.  He attempted to go to the bathroom but felt dizzy and lightheaded, had difficulty walking and had to crawl to the bathroom.  He felt nauseous at the time but has not vomited and denies any abdominal pain or diarrhea.  Patient states that the dizziness is improved but still present, he was able to walk a short distance to his car from his house, but has not tried to walk again since then.  He denies any vision changes or speech changes.  He does not take any blood thinners.  He states he was feeling well last night before he went to bed with no recent fevers, cough, chest pain, shortness of breath, or dysuria.     Physical Exam   Triage Vital Signs: ED Triage Vitals  Encounter Vitals Group     BP 08/12/22 1320 (!) 140/70     Systolic BP Percentile --      Diastolic BP Percentile --      Pulse Rate 08/12/22 1320 62     Resp 08/12/22 1320 20     Temp 08/12/22 1320 97.8 F (36.6 C)     Temp Source 08/12/22 1320 Oral     SpO2 08/12/22 1320 96 %     Weight 08/12/22 1317 218 lb 4.1 oz (99 kg)     Height 08/12/22 1317 5\' 9"  (1.753 m)     Head Circumference --      Peak Flow --      Pain Score 08/12/22 1316 5     Pain Loc --      Pain Education --      Exclude from Growth Chart --     Most recent vital signs: Vitals:   08/12/22 1320  BP: (!) 140/70  Pulse: 62  Resp: 20  Temp: 97.8 F (36.6 C)  SpO2: 96%    Constitutional: Alert and  oriented. Eyes: Conjunctivae are normal. Head: Atraumatic. Nose: No congestion/rhinnorhea. Mouth/Throat: Mucous membranes are moist.  Cardiovascular: Normal rate, regular rhythm. Grossly normal heart sounds.  2+ radial pulses bilaterally. Respiratory: Normal respiratory effort.  No retractions. Lungs CTAB. Gastrointestinal: Soft and nontender. No distention. Musculoskeletal: No lower extremity tenderness nor edema.  Neurologic:  Normal speech and language.  Slight right facial droop noted.  4-5 strength in right upper and right lower extremities with slight pronator drift.  5 out of 5 strength in left upper and lower extremities.    ED Results / Procedures / Treatments   Labs (all labs ordered are listed, but only abnormal results are displayed) Labs Reviewed  COMPREHENSIVE METABOLIC PANEL - Abnormal; Notable for the following components:      Result Value   Glucose, Bld 132 (*)    All other components within normal limits  CBG MONITORING, ED - Abnormal; Notable for the following components:   Glucose-Capillary 117 (*)    All other components within normal limits  PROTIME-INR  APTT  CBC  DIFFERENTIAL  ETHANOL  URINALYSIS, ROUTINE W REFLEX MICROSCOPIC     EKG  ED ECG REPORT I, Chesley Noon, the attending physician, personally viewed and interpreted this ECG.   Date: 08/12/2022  EKG Time: 13:17  Rate: 59  Rhythm: sinus bradycardia  Axis: Normal  Intervals:none  ST&T Change: None  RADIOLOGY CT head reviewed and interpreted by me with no hemorrhage or midline shift.  PROCEDURES:  Critical Care performed: No  Procedures   MEDICATIONS ORDERED IN ED: Medications  aspirin chewable tablet 324 mg (has no administration in time range)     IMPRESSION / MDM / ASSESSMENT AND PLAN / ED COURSE  I reviewed the triage vital signs and the nursing notes.                              72 y.o. male with past medical history of hypertension, diabetes, CAD, COPD, and  stroke who presents to the ED complaining of dizziness and head pressure since getting up this morning, had difficulty walking to the bathroom.  Patient's presentation is most consistent with acute presentation with potential threat to life or bodily function.  Differential diagnosis includes, but is not limited to, stroke, TIA, SAH, migraine headache, tension headache, anemia, electrolyte abnormality, AKI, UTI.  Patient nontoxic-appearing and in no acute distress, vital signs are unremarkable.  He states that his headache is improved, describes minimal pressure in his head.  CT head is negative for acute process and I doubt SAH, no findings concerning for meningitis.  He does appear to have right-sided weakness with pronator drift and slight right facial droop on exam and I am concerned for stroke.  He is VAN negative and I doubt large vessel occlusion, we will further assess with MRI and give loading dose of aspirin.  Labs are reassuring with no significant anemia, [place] ptosis, tract abnormality, or AKI.  LFTs are unremarkable.  Case discussed with hospitalist for admission.      FINAL CLINICAL IMPRESSION(S) / ED DIAGNOSES   Final diagnoses:  Dizziness  Right sided weakness     Rx / DC Orders   ED Discharge Orders     None        Note:  This document was prepared using Dragon voice recognition software and may include unintentional dictation errors.   Chesley Noon, MD 08/12/22 1610

## 2022-08-13 ENCOUNTER — Other Ambulatory Visit: Payer: Self-pay

## 2022-08-13 DIAGNOSIS — E119 Type 2 diabetes mellitus without complications: Secondary | ICD-10-CM | POA: Diagnosis not present

## 2022-08-13 DIAGNOSIS — I10 Essential (primary) hypertension: Secondary | ICD-10-CM | POA: Diagnosis not present

## 2022-08-13 DIAGNOSIS — E1169 Type 2 diabetes mellitus with other specified complication: Secondary | ICD-10-CM | POA: Diagnosis not present

## 2022-08-13 DIAGNOSIS — R42 Dizziness and giddiness: Secondary | ICD-10-CM | POA: Diagnosis not present

## 2022-08-13 LAB — LIPID PANEL
Cholesterol: 94 mg/dL (ref 0–200)
HDL: 29 mg/dL — ABNORMAL LOW (ref >40)
LDL Cholesterol: 38 mg/dL (ref 0–99)
Total CHOL/HDL Ratio: 3.2 RATIO
Triglycerides: 137 mg/dL (ref <150)
VLDL: 27 mg/dL (ref 0–40)

## 2022-08-13 LAB — GLUCOSE, CAPILLARY: Glucose-Capillary: 85 mg/dL (ref 70–99)

## 2022-08-13 LAB — HEMOGLOBIN A1C
Hgb A1c MFr Bld: 5.3 % (ref 4.8–5.6)
Mean Plasma Glucose: 105.41 mg/dL

## 2022-08-13 MED ORDER — CEPHALEXIN 500 MG PO CAPS
500.0000 mg | ORAL_CAPSULE | Freq: Three times a day (TID) | ORAL | 0 refills | Status: DC
  Filled 2022-08-13: qty 15, 5d supply, fill #0

## 2022-08-13 MED ORDER — MECLIZINE HCL 25 MG PO TABS
25.0000 mg | ORAL_TABLET | Freq: Three times a day (TID) | ORAL | 0 refills | Status: DC | PRN
  Filled 2022-08-13: qty 30, 10d supply, fill #0

## 2022-08-13 NOTE — Progress Notes (Addendum)
SLP Cancellation Note  Patient Details Name: Casey Golden MRN: [id number] DOB: Nov 04, 1950   Cancelled treatment:       Reason Eval/Treat Not Completed: SLP screened, no needs identified, will sign off (chart reviewed; met w/ NSG and pt.)  Pt denied any difficulty swallowing and is currently on a regular diet; tolerates swallowing pills w/ water per NSG.  Pt conversed in general conversation w/out gross, overt expressive/receptive deficits noted; he exhibited, inconsistent/intermittent hesitations in speech. Pt denied any new speech-language deficits or changes from his Baseline. Speech clear, intelligible. Pt described he has been dx'd w/ a UTI, "glad it's not a stroke". MRI was negative for acute infarct; Head CT noted "Chronic microvascular ischemic change and cerebral volume loss". Noted during pt's chart review that he last had a wellness exam visit w/ his PCP 06/2022 - noted he has been administered the Mini Mental State Exam(MMSE) in previous years.    No further skilled ST services indicated as pt appears at his baseline. Pt agreed. NSG to reconsult if any change in status while admitted.      Jerilynn Som, MS, CCC-SLP Speech Language Pathologist Rehab Services; [facility] [phone] (ascom) [place] 08/13/2022, 11:14 AM

## 2022-08-13 NOTE — Discharge Summary (Signed)
Physician Discharge Summary   Patient: Casey Golden MRN: [id number] DOB: Nov 01, 1950  Admit date:     08/12/2022  Discharge date: 08/13/22  Discharge Physician: Arnetha Courser   PCP: Eustaquio Boyden, MD   Recommendations at discharge:  Please obtain CBC and BMP in 1 week Patient is being given 5-day course of Keflex for concern of UTI based on urinary symptoms and mild pyuria. Follow-up with primary care provider within a week Consider vestibular rehab if recurrence of vertigo.  Discharge Diagnoses: Principal Problem:   Dizziness Active Problems:   HTN (hypertension)   CAD (coronary artery disease), native coronary artery   Dyslipidemia associated with type 2 diabetes mellitus (HCC)   Diabetes mellitus without complication (HCC)   Centrilobular emphysema (HCC)   OSA (obstructive sleep apnea)   Obesity, Class I, BMI 30-34.9   Hospital Course: Taken from H&P.  Casey Golden is a 72 y.o. male with medical history significant of hypertension, hyperlipidemia, diabetes mellitus, CAD, GERD, obesity, OSA on CPAP, former smoker, who presents with dizziness and generalized weakness, starting around 6 AM on the day of admission.  No other focal deficit or hearing issues.  Data reviewed independently and ED Course: pt was found to have WBC 10.4, INR 1.1, GFR> 60, temperature normal, blood pressure 140/70, heart rate 62, RR 20, oxygen saturation 96% on room air.  CT of head is negative for acute intracranial abnormalities.  EKG: Sinus rhythm, QTc 427, low voltage, no ischemic change.   7/15: Vital stable.  MRI was negative for any acute abnormality.  Patient was having burning micturition and increased urinary frequency for the past few days.  UA with mild pyuria.  Remained afebrile.  He was given Keflex for 5 days based on urinary symptoms for concern of developing UTI.  Dizziness has been improved.  He was also given meclizine to use as needed.  Our physical therapist evaluated him with  no further recommendations.  If he continues to have some dizziness which is more like vertigo, he will get benefit from vestibular rehab.  His PCP can help.  Patient will continue on his home medications and need to have a close follow-up with PCP for further recommendations.       Consultants: None Procedures performed: None Disposition: Home Diet recommendation:  Discharge Diet Orders (From admission, onward)     Start     Ordered   08/13/22 0000  Diet - low sodium heart healthy        08/13/22 1033           Cardiac and Carb modified diet DISCHARGE MEDICATION: Allergies as of 08/13/2022       Reactions   Elemental Sulfur    Imdur [isosorbide Nitrate]    H/A, "Sick", could not get out of bed   Sulfa Antibiotics Hives        Medication List     STOP taking these medications    tiZANidine 2 MG tablet Commonly known as: ZANAFLEX       TAKE these medications    aspirin EC 81 MG tablet Take 1 tablet (81 mg total) by mouth daily.   atorvastatin 40 MG tablet Commonly known as: LIPITOR Take 1 tablet (40 mg total) by mouth daily.   Blood Glucose Monitoring Suppl Devi 1 each by Does not apply route in the morning, at noon, and at bedtime. May substitute to any manufacturer covered by patient's insurance.   cephALEXin 500 MG capsule Commonly known as: KEFLEX Take 1  capsule (500 mg total) by mouth 3 (three) times daily for 5 days.   diclofenac sodium 1 % Gel Commonly known as: VOLTAREN APPLY 2 G TOPICALLY 3 (THREE) TIMES DAILY AS NEEDED (ANTI INFLAMMATORY).   gabapentin 300 MG capsule Commonly known as: NEURONTIN Take 1 capsule (300 mg total) by mouth 3 (three) times daily.   ibuprofen 200 MG tablet Commonly known as: ADVIL Take 200-600 mg by mouth every 8 (eight) hours as needed for mild pain (severe back pain.).   losartan 50 MG tablet Commonly known as: Cozaar Take 1 tablet (50 mg total) by mouth daily.   meclizine 25 MG tablet Commonly  known as: ANTIVERT Take 1 tablet (25 mg total) by mouth 3 (three) times daily as needed for dizziness.   metFORMIN 500 MG tablet Commonly known as: GLUCOPHAGE Take 1 tablet (500 mg total) by mouth daily with breakfast.   nitroGLYCERIN 0.4 MG SL tablet Commonly known as: NITROSTAT Place 1 tablet (0.4 mg total) under the tongue every 5 (five) minutes as needed for chest pain (do not take more than 3 doses.).   Trulicity 0.75 MG/0.5ML Sopn Generic drug: Dulaglutide Inject 0.75 mg into the skin once a week.        Follow-up Information     Eustaquio Boyden, MD. Schedule an appointment as soon as possible for a visit in 1 week(s).   Specialty: Family Medicine Contact information: [address] [place] [address] [phone]                Discharge Exam: Ceasar Mons Weights   08/12/22 1317  Weight: 99 kg   General.  Hard of hearing elderly man, in no acute distress. Pulmonary.  Lungs clear bilaterally, normal respiratory effort. CV.  Regular rate and rhythm, no JVD, rub or murmur. Abdomen.  Soft, nontender, nondistended, BS positive. CNS.  Alert and oriented .  No focal neurologic deficit. Extremities.  No edema, no cyanosis, pulses intact and symmetrical. Psychiatry.  Judgment and insight appears normal.   Condition at discharge: stable  The results of significant diagnostics from this hospitalization (including imaging, microbiology, ancillary and laboratory) are listed below for reference.   Imaging Studies: MR BRAIN WO CONTRAST  Result Date: 08/12/2022 CLINICAL DATA:  Stroke suspected EXAM: MRI HEAD WITHOUT CONTRAST TECHNIQUE: Multiplanar, multiecho pulse sequences of the brain and surrounding structures were obtained without intravenous contrast. COMPARISON:  09/10/2020 MRI head, correlation is also made with 08/12/2022 CT head FINDINGS: Brain: No restricted diffusion to suggest acute or subacute infarct. No acute hemorrhage, mass, mass effect, or  midline shift. No hydrocephalus or extra-axial collection. Normal pituitary and craniocervical junction. No hemosiderin deposition to suggest remote hemorrhage. Ex vacuo dilatation of the ventricles, consistent with central volume loss, without features suspicious for normal pressure hydrocephalus. Scattered T2 hyperintense signal in the periventricular white matter, likely the sequela of chronic small vessel ischemic disease. Vascular: Normal arterial flow voids. Skull and upper cervical spine: Normal marrow signal. Sinuses/Orbits: Minimal mucosal thickening in the ethmoid air cells. Status post bilateral lens replacements. Other: The mastoid air cells are well aerated. IMPRESSION: No acute intracranial process. No evidence of acute or subacute infarct. Electronically Signed   By: Wiliam Ke M.D.   On: 08/12/2022 23:02   CT HEAD WO CONTRAST  Result Date: 08/12/2022 CLINICAL DATA:  Headache, neuro deficit EXAM: CT HEAD WITHOUT CONTRAST TECHNIQUE: Contiguous axial images were obtained from the base of the skull through the vertex without intravenous contrast. RADIATION DOSE REDUCTION: This  exam was performed according to the departmental dose-optimization program which includes automated exposure control, adjustment of the mA and/or kV according to patient size and/or use of iterative reconstruction technique. COMPARISON:  11/28/2021 FINDINGS: Brain: No evidence of acute infarction, hemorrhage, hydrocephalus, extra-axial collection or mass lesion/mass effect. Scattered low-density changes within the periventricular and subcortical white matter most compatible with chronic microvascular ischemic change. Mild diffuse cerebral volume loss. Vascular: Atherosclerotic calcifications involving the large vessels of the skull base. No unexpected hyperdense vessel. Skull: Normal. Negative for fracture or focal lesion. Sinuses/Orbits: No acute finding. Other: None. IMPRESSION: 1. No acute intracranial findings. 2.  Chronic microvascular ischemic change and cerebral volume loss. Electronically Signed   By: Duanne Guess D.O.   On: 08/12/2022 14:06    Microbiology: Results for orders placed or performed during the hospital encounter of 01/01/20  SARS CORONAVIRUS 2 (TAT 6-24 HRS) Nasopharyngeal Nasopharyngeal Swab     Status: None   Collection Time: 01/01/20  8:45 AM   Specimen: Nasopharyngeal Swab  Result Value Ref Range Status   SARS Coronavirus 2 NEGATIVE NEGATIVE Final    Comment: (NOTE) SARS-CoV-2 target nucleic acids are NOT DETECTED.  The SARS-CoV-2 RNA is generally detectable in upper and lower respiratory specimens during the acute phase of infection. Negative results do not preclude SARS-CoV-2 infection, do not rule out co-infections with other pathogens, and should not be used as the sole basis for treatment or other patient management decisions. Negative results must be combined with clinical observations, patient history, and epidemiological information. The expected result is Negative.  Fact Sheet for Patients: HairSlick.no  Fact Sheet for Healthcare Providers: quierodirigir.com  This test is not yet approved or cleared by the [place] FDA and  has been authorized for detection and/or diagnosis of SARS-CoV-2 by FDA under an Emergency Use Authorization (EUA). This EUA will remain  in effect (meaning this test can be used) for the duration of the COVID-19 declaration under Se ction 564(b)(1) of the Act, 21 U.S.C. section 360bbb-3(b)(1), unless the authorization is terminated or revoked sooner.  Performed at [facility], [address], [place], [place] [address]     Labs: CBC: Recent Labs  Lab 08/12/22 1320  WBC 10.4  NEUTROABS 6.6  HGB 16.3  HCT 46.4  MCV 96.9  PLT 233   Basic Metabolic Panel: Recent Labs  Lab 08/12/22 1320  NA 138  K 4.0  CL 101  CO2 27  GLUCOSE 132*  BUN 23  CREATININE  0.77  CALCIUM 9.2   Liver Function Tests: Recent Labs  Lab 08/12/22 1320  AST 35  ALT 32  ALKPHOS 57  BILITOT 1.1  PROT 7.6  ALBUMIN 4.3   CBG: Recent Labs  Lab 08/12/22 1321 08/12/22 1751 08/12/22 1948 08/13/22 0756  GLUCAP 117* 158* 118* 85    Discharge time spent: greater than 30 minutes.  This record has been created using Conservation officer, historic buildings. Errors have been sought and corrected,but may not always be located. Such creation errors do not reflect on the standard of care.   Signed: Arnetha Courser, MD [facility] 08/13/2022

## 2022-08-13 NOTE — Progress Notes (Signed)
Physical Therapy Evaluation Patient Details Name: Casey Golden MRN: [id number] DOB: 05/29/1950 Today's Date: 08/13/2022  History of Present Illness  Pt is a 72y/o male with initial complaints of dizziness. PMH includes HTN, diabetes, CAD, COPD, and stroke. Current MRI results show no evidence of acute or subacute infarct.  Clinical Impression   Pt presents in bed with family member in the room. Current home set up includes 4 stairs to enter but is unclear if there are rails available. Pt also works part time at [employer] and is able to ambulate short distances without an AD but utilizes a cart at work for longer distances to help alleviate low back pain.    Pt verbalized he is currently functioning at his baseline. PT screening revealed no remarkable findings with gross ROM, MMT, light touch, and vision screening. No central signs were present with cross cover testing or smooth pursuits. Pt was able to ambulate with CGA and initially utilized the IV pole but was able to maintain balance and gait speed without IV pole for support. Noteable forward flexed posture and kyphosis was present throughout mobility, limiting ability to visually scan surroundings and awareness. Pt currently does not show need for continued therapy at this time since he is at baseline functioning.    Assistance Recommended at Discharge None  If plan is discharge home, recommend the following:  Can travel by private vehicle   (n/a)        Equipment Recommendations None recommended by PT  Recommendations for Other Services       Functional Status Assessment Patient has not had a recent decline in their functional status     Precautions / Restrictions Precautions Precautions: Fall Restrictions Weight Bearing Restrictions: No      Mobility  Bed Mobility Overal bed mobility: Independent                  Transfers Overall transfer level: Modified independent Equipment used: None                General transfer comment: Increased time due to LBP / discomfort, forward flexed posture immediately upon standing    Ambulation/Gait Ambulation/Gait assistance: Modified independent (Device/Increase time) Gait Distance (Feet): 200 Feet Assistive device: IV Pole Gait Pattern/deviations: Trunk flexed, Decreased stride length, Shuffle       General Gait Details: Evident forward flexed posture and kyphosis limits awareness of surroundings/ ability to visually scan obstacles. Able to correct with VC but quickly would return to original posturing.  Stairs            Wheelchair Mobility     Tilt Bed    Modified Rankin (Stroke Patients Only)       Balance Overall balance assessment: Needs assistance Sitting-balance support: Bilateral upper extremity supported, Feet supported, Feet unsupported Sitting balance-Leahy Scale: Normal   Postural control: Posterior lean Standing balance support: No upper extremity supported Standing balance-Leahy Scale: Good                               Pertinent Vitals/Pain Pain Assessment Pain Assessment: Faces Faces Pain Scale: Hurts a little bit Pain Location: back Pain Descriptors / Indicators: Constant, Discomfort Pain Intervention(s): Monitored during session, Repositioned    Home Living Family/patient expects to be discharged to:: Private residence Living Arrangements: Spouse/significant other Available Help at Discharge: Family Type of Home: House Home Access: Stairs to enter   [employer] of Steps: 4  Home Layout: One level        Prior Function Prior Level of Function : Independent/Modified Independent             Mobility Comments: PTA was able to [employer] with a push mower and works part time at [employer]. For short distances, able to ambulate without AD but longer distances will grab a cart due to low back pain.       Hand Dominance   Dominant Hand: Right    Extremity/Trunk  Assessment   Upper Extremity Assessment Upper Extremity Assessment: Overall WFL for tasks assessed;RUE deficits/detail;LUE deficits/detail RUE Deficits / Details: Gross MMT 4/5 and light touch sensasion unremarkable, RUE ROM WFL but shoulder ABD/Flexion were lacking at end range RUE Sensation: WNL LUE Deficits / Details: Gross MMT 4/5, light touch sensation and ROM unremarkable LUE Sensation: WNL    Lower Extremity Assessment Lower Extremity Assessment: Overall WFL for tasks assessed    Cervical / Trunk Assessment Cervical / Trunk Assessment: Kyphotic  Communication   Communication: No difficulties  Cognition Arousal/Alertness: Awake/alert Behavior During Therapy: WFL for tasks assessed/performed Overall Cognitive Status: Within Functional Limits for tasks assessed                                 General Comments: Has difficulty with hearing        General Comments      Exercises     Assessment/Plan    PT Assessment Patient does not need any further PT services  PT Problem List         PT Treatment Interventions      PT Goals (Current goals can be found in the Care Plan section)  Acute Rehab PT Goals Patient Stated Goal: return home PT Goal Formulation: With patient Time For Goal Achievement: 08/15/22 Potential to Achieve Goals: Good    Frequency       Co-evaluation               AM-PAC PT "6 Clicks" Mobility  Outcome Measure Help needed turning from your back to your side while in a flat bed without using bedrails?: None Help needed moving from lying on your back to sitting on the side of a flat bed without using bedrails?: None Help needed moving to and from a bed to a chair (including a wheelchair)?: None Help needed standing up from a chair using your arms (e.g., wheelchair or bedside chair)?: None Help needed to walk in hospital room?: None Help needed climbing 3-5 steps with a railing? : None 6 Click Score: 24    End of  Session Equipment Utilized During Treatment: Gait belt Activity Tolerance: Patient tolerated treatment well;No increased pain Patient left: in chair;with call bell/phone within reach;with chair alarm set;with family/visitor present   PT Visit Diagnosis: Unsteadiness on feet (R26.81);Dizziness and giddiness (R42);Other abnormalities of gait and mobility (R26.89)    Time: 4401-0272 PT Time Calculation (min) (ACUTE ONLY): 34 min   Charges:   PT Evaluation $PT Eval Low Complexity: 1 Low PT Treatments $Therapeutic Activity: 8-22 mins PT General Charges $$ ACUTE PT VISIT: 1 Visit        Alexiya Franqui, PT, SPT 12:48 PM,08/13/22

## 2022-08-13 NOTE — Hospital Course (Addendum)
Taken from H&P.  Casey Golden is a 72 y.o. male with medical history significant of hypertension, hyperlipidemia, diabetes mellitus, CAD, GERD, obesity, OSA on CPAP, former smoker, who presents with dizziness and generalized weakness, starting around 6 AM on the day of admission.  No other focal deficit or hearing issues.  Data reviewed independently and ED Course: pt was found to have WBC 10.4, INR 1.1, GFR> 60, temperature normal, blood pressure 140/70, heart rate 62, RR 20, oxygen saturation 96% on room air.  CT of head is negative for acute intracranial abnormalities.  EKG: Sinus rhythm, QTc 427, low voltage, no ischemic change.   7/15: Vital stable.  MRI was negative for any acute abnormality.  Patient was having burning micturition and increased urinary frequency for the past few days.  UA with mild pyuria.  Remained afebrile.  He was given Keflex for 5 days based on urinary symptoms for concern of developing UTI.  Dizziness has been improved.  He was also given meclizine to use as needed.  Our physical therapist evaluated him with no further recommendations.  If he continues to have some dizziness which is more like vertigo, he will get benefit from vestibular rehab.  His PCP can help.  Patient will continue on his home medications and need to have a close follow-up with PCP for further recommendations.

## 2022-08-13 NOTE — Progress Notes (Signed)
Pt discharged , went through discharge instruction with the pt, verbalize understanding with follow up appointment. Pt left   with his wife

## 2022-08-15 ENCOUNTER — Encounter: Payer: Self-pay | Admitting: Family Medicine

## 2022-08-15 ENCOUNTER — Ambulatory Visit (INDEPENDENT_AMBULATORY_CARE_PROVIDER_SITE_OTHER): Payer: Medicare Other | Admitting: Family Medicine

## 2022-08-15 VITALS — BP 136/70 | HR 67 | Temp 97.6°F | Ht 69.0 in | Wt 220.1 lb

## 2022-08-15 DIAGNOSIS — R5381 Other malaise: Secondary | ICD-10-CM | POA: Diagnosis not present

## 2022-08-15 DIAGNOSIS — R42 Dizziness and giddiness: Secondary | ICD-10-CM

## 2022-08-15 DIAGNOSIS — E1169 Type 2 diabetes mellitus with other specified complication: Secondary | ICD-10-CM | POA: Diagnosis not present

## 2022-08-15 DIAGNOSIS — R5383 Other fatigue: Secondary | ICD-10-CM | POA: Diagnosis not present

## 2022-08-15 NOTE — Patient Instructions (Addendum)
Labs today  Finish antibiotics but drop Keflex to twice daily.  Recent evaluation was reassuring.  Let us know if any recurrent symptoms including dizziness.

## 2022-08-15 NOTE — Progress Notes (Signed)
Ph: [phone] Fax: [phone]   Patient ID: Casey Golden, male    DOB: 09-03-1950, 72 y.o.   MRN: [id number]  This visit was conducted in person.  BP 136/70 (BP Location: Right Arm, Cuff Size: Large)   Pulse 67   Temp 97.6 F (36.4 C) (Temporal)   Ht 5\' 9"  (1.753 m)   Wt 220 lb 2 oz (99.8 kg)   SpO2 98%   BMI 32.51 kg/m   BP Readings from Last 3 Encounters:  08/15/22 136/70  08/13/22 136/73  07/03/22 124/62  Orthostatic Vitals for the past 48 hrs (Last 6 readings):  Patient Position Orthostatic BP BP Pulse BP Location BP Method Cuff Size Patient Position (if appropriate)  08/15/22 1408 -- -- (!) 158/70 67 -- -- -- --  08/15/22 1412 -- -- (!) 156/70 -- -- -- -- --  08/15/22 1413 Supine 164/72 -- -- -- -- -- --  08/15/22 1416 Standing 170/78 -- -- -- -- -- --  08/15/22 1501 -- -- 136/70 -- Right Arm Manual Large Sitting    CC: hops f/u visit  Subjective:   HPI: Casey Golden is a 72 y.o. male presenting on 08/15/2022 for Hospitalization Follow-up (Admitted on 08/12/22 at [facility], dx dizziness; R-sided weakness. )   Recent hospitalization for dizziness and malaise/weakness that started on Sunday.  This occurred after mowing the grass. Head CT and MRI brain negative for acute process.  Found to have UTI based on mildly abnormal UA (tr LE, but 0-5 WBC/hpf) - started on 5d keflex antibiotic course. Urine culture was not sent. Denies dysuria, frequency, hematuria, fevers/chills, flank pain. Chronic mild urgency.  Pressure to R top of head as well as dizziness described as lightheaded and unsteady, no vertigo room spinning, treated with meclizine. Saw hospital PT.  Hospital records reviewed. Med rec performed.  Tizanidine was discontinued.   BP elevated today - he is unsure if he took losartan 50mg  today. Improved on recheck  He continues aspirin 81mg  daily, atorvastatin 40mg  daily, metformin and Trulicity.   Home health not set up.  Other follow up appointments  scheduled: none ______________________________________________________________________ Hospital admission: 08/12/2022 Hospital discharge: 08/13/2022 TCM f/u phone call: not performed - seen within 2 days of hospital discharge  Recommendations at discharge:  Please obtain CBC and BMP in 1 week Patient is being given 5-day course of Keflex for concern of UTI based on urinary symptoms and mild pyuria. Follow-up with primary care provider within a week Consider vestibular rehab if recurrence of vertigo.   Discharge Diagnoses: Principal Problem:   Dizziness Active Problems:   HTN (hypertension)   CAD (coronary artery disease), native coronary artery   Dyslipidemia associated with type 2 diabetes mellitus (HCC)   Diabetes mellitus without complication (HCC)   Centrilobular emphysema (HCC)   OSA (obstructive sleep apnea)   Obesity, Class I, BMI 30-34.9     Relevant past medical, surgical, family and social history reviewed and updated as indicated. Interim medical history since our last visit reviewed. Allergies and medications reviewed and updated. Outpatient Medications Prior to Visit  Medication Sig Dispense Refill   aspirin 81 MG EC tablet Take 1 tablet (81 mg total) by mouth daily. 30 tablet 12   atorvastatin (LIPITOR) 40 MG tablet Take 1 tablet (40 mg total) by mouth daily. 90 tablet 1   Blood Glucose Monitoring Suppl DEVI 1 each by Does not apply route in the morning, at noon, and at bedtime. May substitute to any manufacturer covered by  patient's insurance. 1 each 0   diclofenac sodium (VOLTAREN) 1 % GEL APPLY 2 G TOPICALLY 3 (THREE) TIMES DAILY AS NEEDED (ANTI INFLAMMATORY). 100 g 1   Dulaglutide (TRULICITY) 0.75 MG/0.5ML SOPN Inject 0.75 mg into the skin once a week. 2 mL 6   gabapentin (NEURONTIN) 300 MG capsule Take 1 capsule (300 mg total) by mouth 3 (three) times daily. 270 capsule 1   ibuprofen (ADVIL,MOTRIN) 200 MG tablet Take 200-600 mg by mouth every 8 (eight) hours as  needed for mild pain (severe back pain.).     losartan (COZAAR) 50 MG tablet Take 1 tablet (50 mg total) by mouth daily. 90 tablet 1   cephALEXin (KEFLEX) 500 MG capsule Take 1 capsule (500 mg total) by mouth 3 (three) times daily for 5 days. 15 capsule 0   meclizine (ANTIVERT) 25 MG tablet Take 1 tablet (25 mg total) by mouth 3 (three) times daily as needed for dizziness. 30 tablet 0   metFORMIN (GLUCOPHAGE) 500 MG tablet Take 1 tablet (500 mg total) by mouth daily with breakfast. 90 tablet 1   cephALEXin (KEFLEX) 500 MG capsule Take 1 capsule (500 mg total) by mouth 2 (two) times daily.     nitroGLYCERIN (NITROSTAT) 0.4 MG SL tablet Place 1 tablet (0.4 mg total) under the tongue every 5 (five) minutes as needed for chest pain (do not take more than 3 doses.). 50 tablet 1   No facility-administered medications prior to visit.     Per HPI unless specifically indicated in ROS section below Review of Systems  Objective:  BP 136/70 (BP Location: Right Arm, Cuff Size: Large)   Pulse 67   Temp 97.6 F (36.4 C) (Temporal)   Ht 5\' 9"  (1.753 m)   Wt 220 lb 2 oz (99.8 kg)   SpO2 98%   BMI 32.51 kg/m   Wt Readings from Last 3 Encounters:  08/15/22 220 lb 2 oz (99.8 kg)  08/12/22 218 lb 4.1 oz (99 kg)  07/19/22 220 lb (99.8 kg)      Physical Exam Vitals and nursing note reviewed.  Constitutional:      Appearance: Normal appearance. He is not ill-appearing.     Comments: Stopped posture with ambulation, ambulates unassisted  HENT:     Mouth/Throat:     Mouth: Mucous membranes are moist.     Pharynx: Oropharynx is clear. No oropharyngeal exudate or posterior oropharyngeal erythema.  Eyes:     Extraocular Movements: Extraocular movements intact.     Pupils: Pupils are equal, round, and reactive to light.  Cardiovascular:     Rate and Rhythm: Normal rate and regular rhythm.     Pulses: Normal pulses.     Heart sounds: Normal heart sounds. No murmur heard. Pulmonary:     Effort:  Pulmonary effort is normal. No respiratory distress.     Breath sounds: Normal breath sounds. No wheezing, rhonchi or rales.  Musculoskeletal:     Right lower leg: No edema.     Left lower leg: No edema.  Skin:    General: Skin is warm and dry.     Findings: No rash.  Neurological:     General: No focal deficit present.     Mental Status: He is alert and oriented to person, place, and time.     Comments:  CN 2-12 intact except for decreased hearing FTN intact EOMI  Psychiatric:        Mood and Affect: Mood normal.  Behavior: Behavior normal.       Results for orders placed or performed in visit on 08/15/22  CBC with Differential/Platelet  Result Value Ref Range   WBC 6.8 4.0 - 10.5 K/uL   RBC 4.61 4.22 - 5.81 Mil/uL   Hemoglobin 15.7 13.0 - 17.0 g/dL   HCT 86.5 78.4 - 69.6 %   MCV 98.8 78.0 - 100.0 fl   MCHC 34.5 30.0 - 36.0 g/dL   RDW 29.5 28.4 - 13.2 %   Platelets 242.0 150.0 - 400.0 K/uL   Neutrophils Relative % 43.7 43.0 - 77.0 %   Lymphocytes Relative 40.7 12.0 - 46.0 %   Monocytes Relative 8.9 3.0 - 12.0 %   Eosinophils Relative 5.6 (H) 0.0 - 5.0 %   Basophils Relative 1.1 0.0 - 3.0 %   Neutro Abs 3.0 1.4 - 7.7 K/uL   Lymphs Abs 2.8 0.7 - 4.0 K/uL   Monocytes Absolute 0.6 0.1 - 1.0 K/uL   Eosinophils Absolute 0.4 0.0 - 0.7 K/uL   Basophils Absolute 0.1 0.0 - 0.1 K/uL  Basic metabolic panel  Result Value Ref Range   Sodium 142 135 - 145 mEq/L   Potassium 4.0 3.5 - 5.1 mEq/L   Chloride 103 96 - 112 mEq/L   CO2 33 (H) 19 - 32 mEq/L   Glucose, Bld 69 (L) 70 - 99 mg/dL   BUN 16 6 - 23 mg/dL   Creatinine, Ser 4.40 0.40 - 1.50 mg/dL   GFR 10.27 >25.36 mL/min   Calcium 9.7 8.4 - 10.5 mg/dL   Lab Results  Component Value Date   HGBA1C 5.3 08/12/2022    Assessment & Plan:   Problem List Items Addressed This Visit     Type 2 diabetes mellitus with other specified complication (HCC)    Chronic, he continues trulicity 0.75mg  weekly, weight stable over the  past few months after initial drop with GLP1RA commencement.  He also continues metformin 500mg  once daily.  Recent A1c 5.4% - will recommend stop metformin.       Malaise and fatigue - Primary    Recent hospitalization and reassuring workup reviewed with patient - reassuring head CT, brain MRI. ?symptoms due to possible dehydration in summer heat. Encouraged ongoing efforts towards good hydration status.  Check CBC, BMP today.  I'm not convinced he had UTI as hasn't had significant UTI symptoms and UA was only mildly abnormal. Recommend he drop keflex dose to 500mg  BID and complete 5d course then stop (was prescribed TID dosing).       Dizziness    ?dehydration contributing.  Gabapentin could also contribute.  Not consistent with vertigo - don't think he needs meclizine at this time.  Orthostatics normal in office today.  Continue to encourage good hydration especially in summer months when working outdoors. Tizanidine was discontinued.         No orders of the defined types were placed in this encounter.   Orders Placed This Encounter  Procedures   CBC with Differential/Platelet   Basic metabolic panel    Patient Instructions  Labs today  Finish antibiotics but drop Keflex to twice daily.  Recent evaluation was reassuring.  Let us know if any recurrent symptoms including dizziness.   Follow up plan: No follow-ups on file.  Eustaquio Boyden, MD

## 2022-08-16 LAB — BASIC METABOLIC PANEL
BUN: 16 mg/dL (ref 6–23)
CO2: 33 mEq/L — ABNORMAL HIGH (ref 19–32)
Calcium: 9.7 mg/dL (ref 8.4–10.5)
Chloride: 103 mEq/L (ref 96–112)
Creatinine, Ser: 0.87 mg/dL (ref 0.40–1.50)
GFR: 85.13 mL/min (ref >60.00)
Glucose, Bld: 69 mg/dL — ABNORMAL LOW (ref 70–99)
Potassium: 4 mEq/L (ref 3.5–5.1)
Sodium: 142 mEq/L (ref 135–145)

## 2022-08-16 LAB — CBC WITH DIFFERENTIAL/PLATELET
Basophils Absolute: 0.1 10*3/uL (ref 0.0–0.1)
Basophils Relative: 1.1 % (ref 0.0–3.0)
Eosinophils Absolute: 0.4 10*3/uL (ref 0.0–0.7)
Eosinophils Relative: 5.6 % — ABNORMAL HIGH (ref 0.0–5.0)
HCT: 45.5 % (ref 39.0–52.0)
Hemoglobin: 15.7 g/dL (ref 13.0–17.0)
Lymphocytes Relative: 40.7 % (ref 12.0–46.0)
Lymphs Abs: 2.8 10*3/uL (ref 0.7–4.0)
MCHC: 34.5 g/dL (ref 30.0–36.0)
MCV: 98.8 fl (ref 78.0–100.0)
Monocytes Absolute: 0.6 10*3/uL (ref 0.1–1.0)
Monocytes Relative: 8.9 % (ref 3.0–12.0)
Neutro Abs: 3 10*3/uL (ref 1.4–7.7)
Neutrophils Relative %: 43.7 % (ref 43.0–77.0)
Platelets: 242 10*3/uL (ref 150.0–400.0)
RBC: 4.61 Mil/uL (ref 4.22–5.81)
RDW: 12 % (ref 11.5–15.5)
WBC: 6.8 10*3/uL (ref 4.0–10.5)

## 2022-08-16 NOTE — Assessment & Plan Note (Addendum)
?  dehydration contributing.  Gabapentin could also contribute.  Not consistent with vertigo - don't think he needs meclizine at this time.  Orthostatics normal in office today.  Continue to encourage good hydration especially in summer months when working outdoors. Tizanidine was discontinued.

## 2022-08-16 NOTE — Assessment & Plan Note (Signed)
Chronic, he continues trulicity 0.75mg  weekly, weight stable over the past few months after initial drop with GLP1RA commencement.  He also continues metformin 500mg  once daily.  Recent A1c 5.4% - will recommend stop metformin.

## 2022-08-16 NOTE — Assessment & Plan Note (Addendum)
Recent hospitalization and reassuring workup reviewed with patient - reassuring head CT, brain MRI. ?symptoms due to possible dehydration in summer heat. Encouraged ongoing efforts towards good hydration status.  Check CBC, BMP today.  I'm not convinced he had UTI as hasn't had significant UTI symptoms and UA was only mildly abnormal. Recommend he drop keflex dose to 500mg  BID and complete 5d course then stop (was prescribed TID dosing).

## 2022-08-16 NOTE — Addendum Note (Signed)
Addended by: Eustaquio Boyden on: 08/16/2022 12:41 PM   Modules accepted: Level of Service

## 2022-08-31 ENCOUNTER — Other Ambulatory Visit: Payer: Self-pay

## 2022-08-31 MED ORDER — GLUCOSE BLOOD VI STRP
ORAL_STRIP | 0 refills | Status: AC
  Filled 2022-08-31: qty 100, 30d supply, fill #0

## 2022-08-31 MED ORDER — ONETOUCH DELICA PLUS LANCET33G MISC
Freq: Three times a day (TID) | 0 refills | Status: DC
  Filled 2022-08-31: qty 100, 30d supply, fill #0

## 2022-08-31 MED ORDER — METFORMIN HCL 500 MG PO TABS
500.0000 mg | ORAL_TABLET | Freq: Every day | ORAL | 1 refills | Status: DC
  Filled 2022-08-31: qty 90, 90d supply, fill #0

## 2022-08-31 MED ORDER — ACCU-CHEK SOFTCLIX LANCETS MISC
0 refills | Status: AC
  Filled 2022-08-31: qty 100, 30d supply, fill #0

## 2022-09-03 ENCOUNTER — Other Ambulatory Visit: Payer: Self-pay

## 2022-09-04 ENCOUNTER — Ambulatory Visit (INDEPENDENT_AMBULATORY_CARE_PROVIDER_SITE_OTHER): Payer: Medicare Other | Admitting: Family Medicine

## 2022-09-04 ENCOUNTER — Encounter: Payer: Self-pay | Admitting: Family Medicine

## 2022-09-04 VITALS — BP 146/74 | HR 65 | Temp 97.5°F | Ht 67.75 in | Wt 219.2 lb

## 2022-09-04 DIAGNOSIS — J432 Centrilobular emphysema: Secondary | ICD-10-CM

## 2022-09-04 DIAGNOSIS — I25118 Atherosclerotic heart disease of native coronary artery with other forms of angina pectoris: Secondary | ICD-10-CM | POA: Diagnosis not present

## 2022-09-04 DIAGNOSIS — Z7189 Other specified counseling: Secondary | ICD-10-CM | POA: Diagnosis not present

## 2022-09-04 DIAGNOSIS — E1169 Type 2 diabetes mellitus with other specified complication: Secondary | ICD-10-CM

## 2022-09-04 DIAGNOSIS — Z0000 Encounter for general adult medical examination without abnormal findings: Secondary | ICD-10-CM | POA: Diagnosis not present

## 2022-09-04 DIAGNOSIS — R972 Elevated prostate specific antigen [PSA]: Secondary | ICD-10-CM

## 2022-09-04 DIAGNOSIS — C4499 Other specified malignant neoplasm of skin, unspecified: Secondary | ICD-10-CM

## 2022-09-04 DIAGNOSIS — I10 Essential (primary) hypertension: Secondary | ICD-10-CM | POA: Diagnosis not present

## 2022-09-04 DIAGNOSIS — E669 Obesity, unspecified: Secondary | ICD-10-CM

## 2022-09-04 DIAGNOSIS — K760 Fatty (change of) liver, not elsewhere classified: Secondary | ICD-10-CM

## 2022-09-04 DIAGNOSIS — E785 Hyperlipidemia, unspecified: Secondary | ICD-10-CM

## 2022-09-04 DIAGNOSIS — G4733 Obstructive sleep apnea (adult) (pediatric): Secondary | ICD-10-CM

## 2022-09-04 DIAGNOSIS — I700 Atherosclerosis of aorta: Secondary | ICD-10-CM

## 2022-09-04 NOTE — Assessment & Plan Note (Signed)
H/o mild OSA, but he has been off CPAP for over a year.  Has not returned to neurology since 2022.  Offered updated HST after recent weight loss (last done 2018) - he declines at this time.

## 2022-09-04 NOTE — Assessment & Plan Note (Signed)
Appreciate cardiology Dr Mariah Milling care.  Continue trulicity GLP1RA weekly.

## 2022-09-04 NOTE — Assessment & Plan Note (Addendum)
Needs yearly dermatology f/u.

## 2022-09-04 NOTE — Assessment & Plan Note (Signed)
Continue aspirin, statin.  

## 2022-09-04 NOTE — Assessment & Plan Note (Signed)
Chronic, great control on trulicity - recently stopped metformin. Reassess at 6 mo DM f/u visit.

## 2022-09-04 NOTE — Progress Notes (Signed)
Ph: [phone] Fax: [phone]   Patient ID: Casey Golden, male    DOB: April 07, 1950, 72 y.o.   MRN: [id number]  This visit was conducted in person.  BP (!) 146/74 (BP Location: Right Arm, Cuff Size: Large)   Pulse 65   Temp (!) 97.5 F (36.4 C) (Temporal)   Ht 5' 7.75" (1.721 m)   Wt 219 lb 4 oz (99.5 kg)   SpO2 93%   BMI 33.58 kg/m   BP Readings from Last 3 Encounters:  09/04/22 (!) 146/74  08/15/22 136/70  08/13/22 136/73    CC: CPE Subjective:   HPI: Casey Golden is a 72 y.o. male presenting on 09/04/2022 for Medical Management of Chronic Issues (MCR prt 2 [AWV- 07/19/22].)   Saw health advisor 06/2022 for medicare wellness visit. Note reviewed.   No results found.  Flowsheet Row Office Visit from 08/15/2022 in [facility] [place]  PHQ-2 Total Score 0          07/19/2022    9:52 AM 07/19/2022    8:03 AM 05/24/2022    3:05 PM 07/17/2021   10:55 AM 07/06/2020    1:19 PM  Fall Risk   Falls in the past year? 0 0 0 0 0  Number falls in past yr: 0 0 0 0 0  Injury with Fall? 0 0 0 0 0  Risk for fall due to : No Fall Risks  No Fall Risks No Fall Risks Medication side effect  Follow up Falls prevention discussed;Falls evaluation completed  Falls evaluation completed Falls evaluation completed Falls evaluation completed;Falls prevention discussed    S/p surgical excision 12/2018 of L perineal extramammary scrotal Paget's disease. Subsequent panCT reassuring, only noted incidental mild centrilobular emphysema as well as 1.4cm L adrenal adenoma and bilateral renal cysts. Sees Dr Lorn Junes [facility] dermatology.   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   OSA but not on CPAP - last saw [facility] neurology 11/2020.   DM - continues trulicity 0.75mg  weekly. Metformin recently stopped due to great sugar control with initial GLP1-RA related weight loss.   Preventative: COLONOSCOPY WITH PROPOFOL 04/17/2016 TAs, high grade  dysplasia with margins clear, diverticulosis Midge Minium, MD) COLONOSCOPY WITH PROPOFOL 08/07/2016 TAx1, diverticulosis, rpt 3 yrs ([facility]) COLONOSCOPY WITH PROPOFOL - 01/05/2020 SSP, rpt 5 yrs Servando Snare, Darren, MD) Prostate cancer screening - Dr Mena Goes at [facility] watching elevated PSA. S/p biopsy 03/2017 - BPH with 1 apical core of atypia, sees yearly in August.  Lung cancer screening - not eligible  Flu shot - yearly  COVID vaccine - Pfizer 03/2019 x2, booster 10/2019, 06/2020, bivalent 01/2021 Td 2017, Tdap 05/2018  Pneumovax 2013, prevnar-13 2017 zostavax - 08/2012 shingrix - 08/2017, 11/2017 RSV 10/2021  Advanced directive discussion - scanned and in chart 12/2016. Does not want prolonged life support if terminal condition. No HCPOA form filled out. Would want wife [name] then daughter Lennie Muckle to be HCPOA.  Seat belt use discussed.  Sunscreen use discussed, no changing moles on skin. Sees derm regularly.  Ex smoker quit 1992 Alcohol - none  Dentist yearly - has upper and lower partial dentures  Eye exam yearly  Bowel - no constipation - good fiber in diet.  Bladder - no incontinence    Lives with wife, dog and cats Occupation: retired, was self employed, now works at [employer], hospital [employer]: HS Activity: walks 1.5 mi daily Diet: some water, fruits/vegetables daily  Relevant past medical, surgical, family and social history reviewed and updated as indicated. Interim medical history since our last visit reviewed. Allergies and medications reviewed and updated. Outpatient Medications Prior to Visit  Medication Sig Dispense Refill   Accu-Chek Softclix Lancets lancets Use to check blood glucose 3 times a day 100 each 0   aspirin 81 MG EC tablet Take 1 tablet (81 mg total) by mouth daily. 30 tablet 12   atorvastatin (LIPITOR) 40 MG tablet Take 1 tablet (40 mg total) by mouth daily. 90 tablet 1   Blood Glucose Monitoring Suppl (BLOOD GLUCOSE MONITOR SYSTEM) w/Device KIT Use to  check blood sugar 3 (three) times daily. 1 kit 0   diclofenac sodium (VOLTAREN) 1 % GEL APPLY 2 G TOPICALLY 3 (THREE) TIMES DAILY AS NEEDED (ANTI INFLAMMATORY). 100 g 1   Dulaglutide (TRULICITY) 0.75 MG/0.5ML SOPN Inject 0.75 mg into the skin once a week. 2 mL 6   gabapentin (NEURONTIN) 300 MG capsule Take 1 capsule (300 mg total) by mouth 3 (three) times daily. 270 capsule 1   glucose blood test strip Use to check blood glucose 3 times a day 100 each 0   ibuprofen (ADVIL,MOTRIN) 200 MG tablet Take 200-600 mg by mouth every 8 (eight) hours as needed for mild pain (severe back pain.).     Lancets (ONETOUCH DELICA PLUS LANCET33G) MISC Use to check blood glucose 3 (three) times daily. 100 each 0   losartan (COZAAR) 50 MG tablet Take 1 tablet (50 mg total) by mouth daily. 90 tablet 1   nitroGLYCERIN (NITROSTAT) 0.4 MG SL tablet Place 1 tablet (0.4 mg total) under the tongue every 5 (five) minutes as needed for chest pain (do not take more than 3 doses.). 50 tablet 1   cephALEXin (KEFLEX) 500 MG capsule Take 1 capsule (500 mg total) by mouth 2 (two) times daily.     metFORMIN (GLUCOPHAGE) 500 MG tablet Take 1 tablet (500 mg total) by mouth daily with breakfast. (Patient not taking: Reported on 09/04/2022) 90 tablet 1   No facility-administered medications prior to visit.     Per HPI unless specifically indicated in ROS section below Review of Systems  Constitutional:  Negative for activity change, appetite change, chills, fatigue, fever and unexpected weight change.  HENT:  Negative for hearing loss.   Eyes:  Negative for visual disturbance.  Respiratory:  Negative for cough, chest tightness, shortness of breath and wheezing.   Cardiovascular:  Negative for chest pain, palpitations and leg swelling.  Gastrointestinal:  Negative for abdominal distention, abdominal pain, blood in stool, constipation, diarrhea, nausea and vomiting.  Genitourinary:  Negative for difficulty urinating and hematuria.   Musculoskeletal:  Negative for arthralgias, myalgias and neck pain.       No extremity weakness   Skin:  Negative for rash.  Neurological:  Positive for dizziness (occ). Negative for seizures, syncope and headaches.  Hematological:  Negative for adenopathy. Bruises/bleeds easily.  Psychiatric/Behavioral:  Negative for dysphoric mood. The patient is not nervous/anxious.     Objective:  BP (!) 146/74 (BP Location: Right Arm, Cuff Size: Large)   Pulse 65   Temp (!) 97.5 F (36.4 C) (Temporal)   Ht 5' 7.75" (1.721 m)   Wt 219 lb 4 oz (99.5 kg)   SpO2 93%   BMI 33.58 kg/m   Wt Readings from Last 3 Encounters:  09/04/22 219 lb 4 oz (99.5 kg)  08/15/22 220 lb 2 oz (99.8 kg)  08/12/22 218 lb 4.1 oz (99  kg)      Physical Exam Vitals and nursing note reviewed.  Constitutional:      General: He is not in acute distress.    Appearance: Normal appearance. He is well-developed. He is not ill-appearing.  HENT:     Head: Normocephalic and atraumatic.     Right Ear: Hearing, tympanic membrane, ear canal and external ear normal.     Left Ear: Hearing, tympanic membrane, ear canal and external ear normal.     Nose: Nose normal.     Mouth/Throat:     Mouth: Mucous membranes are moist.     Pharynx: Oropharynx is clear. No oropharyngeal exudate or posterior oropharyngeal erythema.  Eyes:     General: No scleral icterus.    Extraocular Movements: Extraocular movements intact.     Conjunctiva/sclera: Conjunctivae normal.     Pupils: Pupils are equal, round, and reactive to light.  Neck:     Thyroid: No thyroid mass or thyromegaly.  Cardiovascular:     Rate and Rhythm: Normal rate and regular rhythm.     Pulses: Normal pulses.          Radial pulses are 2+ on the right side and 2+ on the left side.     Heart sounds: Normal heart sounds. No murmur heard. Pulmonary:     Effort: Pulmonary effort is normal. No respiratory distress.     Breath sounds: Normal breath sounds. No wheezing, rhonchi  or rales.  Abdominal:     General: Bowel sounds are normal. There is no distension.     Palpations: Abdomen is soft. There is no mass.     Tenderness: There is no abdominal tenderness. There is no guarding or rebound.     Hernia: No hernia is present.  Musculoskeletal:        General: Normal range of motion.     Cervical back: Normal range of motion and neck supple.     Right lower leg: No edema.     Left lower leg: No edema.  Lymphadenopathy:     Cervical: No cervical adenopathy.  Skin:    General: Skin is warm and dry.     Findings: No rash.  Neurological:     General: No focal deficit present.     Mental Status: He is alert and oriented to person, place, and time.  Psychiatric:        Mood and Affect: Mood normal.        Behavior: Behavior normal.        Thought Content: Thought content normal.        Judgment: Judgment normal.       Results for orders placed or performed in visit on 08/15/22  CBC with Differential/Platelet  Result Value Ref Range   WBC 6.8 4.0 - 10.5 K/uL   RBC 4.61 4.22 - 5.81 Mil/uL   Hemoglobin 15.7 13.0 - 17.0 g/dL   HCT 40.1 02.7 - 25.3 %   MCV 98.8 78.0 - 100.0 fl   MCHC 34.5 30.0 - 36.0 g/dL   RDW 66.4 40.3 - 47.4 %   Platelets 242.0 150.0 - 400.0 K/uL   Neutrophils Relative % 43.7 43.0 - 77.0 %   Lymphocytes Relative 40.7 12.0 - 46.0 %   Monocytes Relative 8.9 3.0 - 12.0 %   Eosinophils Relative 5.6 (H) 0.0 - 5.0 %   Basophils Relative 1.1 0.0 - 3.0 %   Neutro Abs 3.0 1.4 - 7.7 K/uL   Lymphs Abs 2.8 0.7 -  4.0 K/uL   Monocytes Absolute 0.6 0.1 - 1.0 K/uL   Eosinophils Absolute 0.4 0.0 - 0.7 K/uL   Basophils Absolute 0.1 0.0 - 0.1 K/uL  Basic metabolic panel  Result Value Ref Range   Sodium 142 135 - 145 mEq/L   Potassium 4.0 3.5 - 5.1 mEq/L   Chloride 103 96 - 112 mEq/L   CO2 33 (H) 19 - 32 mEq/L   Glucose, Bld 69 (L) 70 - 99 mg/dL   BUN 16 6 - 23 mg/dL   Creatinine, Ser 1.61 0.40 - 1.50 mg/dL   GFR 09.60 >45.40 mL/min   Calcium  9.7 8.4 - 10.5 mg/dL   Lab Results  Component Value Date   CHOL 94 08/13/2022   HDL 29 (L) 08/13/2022   LDLCALC 38 08/13/2022   LDLDIRECT 54.0 02/13/2021   TRIG 137 08/13/2022   CHOLHDL 3.2 08/13/2022    Assessment & Plan:   Problem List Items Addressed This Visit     Health maintenance examination - Primary (Chronic)    Preventative protocols reviewed and updated unless pt declined. Discussed healthy diet and lifestyle.       Advanced care planning/counseling discussion (Chronic)    Previously discussed      HTN (hypertension)    Chronic, BP above goal, does improve some on recheck. No changes made today.       CAD (coronary artery disease), native coronary artery    Appreciate cardiology Dr Mariah Milling care.  Continue trulicity GLP1RA weekly.       Type 2 diabetes mellitus with other specified complication (HCC)    Chronic, great control on trulicity - recently stopped metformin. Reassess at 6 mo DM f/u visit.       Dyslipidemia associated with type 2 diabetes mellitus (HCC)    Chronic, stable period on atorvastatin 40mg  daily - continue this.  The ASCVD Risk score (Arnett DK, et al., 2019) failed to calculate for the following reasons:   The valid total cholesterol range is 130 to 320 mg/dL       OSA (obstructive sleep apnea)    H/o mild OSA, but he has been off CPAP for over a year.  Has not returned to neurology since 2022.  Offered updated HST after recent weight loss (last done 2018) - he declines at this time.       Obesity, Class I, BMI 30-34.9    Continue to encourage healthy diet and lifestyle choices to affect sustainable weight loss.       Fatty liver disease, nonalcoholic   Elevated PSA    Encouraged schedule uro f/u - # provided to call and schedule appt       Extramammary Paget's disease    Needs yearly dermatology f/u.       Atherosclerosis of aorta (HCC)    Continue aspirin, statin.       Centrilobular emphysema (HCC)    Stable period  off respiratory medication.         No orders of the defined types were placed in this encounter.   No orders of the defined types were placed in this encounter.   Patient Instructions  Call Dr Mena Goes urology to schedule appointment [phone].  Continue seeing skin doctor regularly.  Good to see you today Return in 6 months for diabetes follow up visit.   Follow up plan: Return in about 6 months (around 03/07/2023) for follow up visit.  Eustaquio Boyden, MD

## 2022-09-04 NOTE — Assessment & Plan Note (Signed)
Stable period off respiratory medication. 

## 2022-09-04 NOTE — Assessment & Plan Note (Signed)
Continue to encourage healthy diet and lifestyle choices to affect sustainable weight loss.  °

## 2022-09-04 NOTE — Patient Instructions (Addendum)
Call Dr Mena Goes urology to schedule appointment [phone].  Continue seeing skin doctor regularly.  Good to see you today Return in 6 months for diabetes follow up visit.

## 2022-09-04 NOTE — Assessment & Plan Note (Signed)
Encouraged schedule uro f/u - # provided to call and schedule appt

## 2022-09-04 NOTE — Assessment & Plan Note (Signed)
Chronic, BP above goal, does improve some on recheck. No changes made today.

## 2022-09-04 NOTE — Assessment & Plan Note (Signed)
Chronic, stable period on atorvastatin 40mg  daily - continue this.  The ASCVD Risk score (Arnett DK, et al., 2019) failed to calculate for the following reasons:   The valid total cholesterol range is 130 to 320 mg/dL

## 2022-09-04 NOTE — Assessment & Plan Note (Signed)
Preventative protocols reviewed and updated unless pt declined. Discussed healthy diet and lifestyle.  

## 2022-09-04 NOTE — Assessment & Plan Note (Signed)
Previously discussed.

## 2022-09-26 DIAGNOSIS — D0422 Carcinoma in situ of skin of left ear and external auricular canal: Secondary | ICD-10-CM | POA: Diagnosis not present

## 2022-10-02 ENCOUNTER — Other Ambulatory Visit: Payer: Self-pay

## 2022-10-30 ENCOUNTER — Other Ambulatory Visit: Payer: Self-pay

## 2022-10-30 DIAGNOSIS — E119 Type 2 diabetes mellitus without complications: Secondary | ICD-10-CM | POA: Diagnosis not present

## 2022-11-09 ENCOUNTER — Other Ambulatory Visit: Payer: Self-pay

## 2022-11-28 ENCOUNTER — Telehealth: Payer: Self-pay | Admitting: Family Medicine

## 2022-11-28 DIAGNOSIS — R351 Nocturia: Secondary | ICD-10-CM | POA: Diagnosis not present

## 2022-11-28 DIAGNOSIS — N401 Enlarged prostate with lower urinary tract symptoms: Secondary | ICD-10-CM | POA: Diagnosis not present

## 2022-11-28 DIAGNOSIS — R972 Elevated prostate specific antigen [PSA]: Secondary | ICD-10-CM | POA: Diagnosis not present

## 2022-11-28 DIAGNOSIS — E1169 Type 2 diabetes mellitus with other specified complication: Secondary | ICD-10-CM

## 2022-11-28 NOTE — Telephone Encounter (Signed)
Prescription Request  11/28/2022  LOV: 09/04/2022  What is the name of the medication or equipment? Dulaglutide (TRULICITY) 0.75 MG/0.5ML SOPN   losartan (COZAAR) 50 MG tablet   Have you contacted your pharmacy to request a refill? No   Which pharmacy would you like this sent to?   [facility] - [place], [place] - [address] [place] [address] Phone: [phone] Fax: [phone]    Patient notified that their request is being sent to the clinical staff for review and that they should receive a response within 2 business days.   Please advise at [facility] [phone]

## 2022-11-28 NOTE — Telephone Encounter (Signed)
Trulicity Last rx:  07/03/22, #2 mL Last OV:  09/04/22, CPE Next OV:  none

## 2022-11-29 MED ORDER — TRULICITY 0.75 MG/0.5ML ~~LOC~~ SOAJ: 0.7500 mg | SUBCUTANEOUS | 3 refills | Status: DC

## 2022-11-29 NOTE — Telephone Encounter (Signed)
ERx to mail order.

## 2022-11-29 NOTE — Addendum Note (Signed)
Addended by: Eustaquio Boyden on: 11/29/2022 08:37 AM   Modules accepted: Orders

## 2022-12-07 ENCOUNTER — Other Ambulatory Visit: Payer: Self-pay | Admitting: Family Medicine

## 2022-12-07 ENCOUNTER — Other Ambulatory Visit: Payer: Self-pay

## 2022-12-07 DIAGNOSIS — E1169 Type 2 diabetes mellitus with other specified complication: Secondary | ICD-10-CM

## 2022-12-07 MED FILL — Dulaglutide Soln Auto-injector 0.75 MG/0.5ML: SUBCUTANEOUS | 28 days supply | Qty: 2 | Fill #0 | Status: AC

## 2022-12-08 ENCOUNTER — Other Ambulatory Visit: Payer: Self-pay

## 2022-12-20 ENCOUNTER — Other Ambulatory Visit: Payer: Self-pay

## 2022-12-20 ENCOUNTER — Emergency Department: Payer: Medicare Other

## 2022-12-20 ENCOUNTER — Emergency Department
Admission: EM | Admit: 2022-12-20 | Discharge: 2022-12-20 | Disposition: A | Discharge status: Home / Self Care | Payer: Medicare Other | Attending: Emergency Medicine | Admitting: Emergency Medicine

## 2022-12-20 ENCOUNTER — Encounter: Payer: Self-pay | Admitting: Emergency Medicine

## 2022-12-20 DIAGNOSIS — G4733 Obstructive sleep apnea (adult) (pediatric): Secondary | ICD-10-CM | POA: Insufficient documentation

## 2022-12-20 DIAGNOSIS — I2510 Atherosclerotic heart disease of native coronary artery without angina pectoris: Secondary | ICD-10-CM | POA: Insufficient documentation

## 2022-12-20 DIAGNOSIS — K219 Gastro-esophageal reflux disease without esophagitis: Secondary | ICD-10-CM | POA: Diagnosis not present

## 2022-12-20 DIAGNOSIS — I10 Essential (primary) hypertension: Secondary | ICD-10-CM | POA: Diagnosis not present

## 2022-12-20 DIAGNOSIS — E785 Hyperlipidemia, unspecified: Secondary | ICD-10-CM | POA: Diagnosis not present

## 2022-12-20 DIAGNOSIS — E669 Obesity, unspecified: Secondary | ICD-10-CM | POA: Insufficient documentation

## 2022-12-20 DIAGNOSIS — I6782 Cerebral ischemia: Secondary | ICD-10-CM | POA: Diagnosis not present

## 2022-12-20 DIAGNOSIS — R200 Anesthesia of skin: Secondary | ICD-10-CM | POA: Diagnosis not present

## 2022-12-20 DIAGNOSIS — R202 Paresthesia of skin: Secondary | ICD-10-CM | POA: Diagnosis not present

## 2022-12-20 DIAGNOSIS — R29810 Facial weakness: Secondary | ICD-10-CM | POA: Diagnosis not present

## 2022-12-20 DIAGNOSIS — R519 Headache, unspecified: Secondary | ICD-10-CM | POA: Diagnosis not present

## 2022-12-20 DIAGNOSIS — E119 Type 2 diabetes mellitus without complications: Secondary | ICD-10-CM | POA: Insufficient documentation

## 2022-12-20 DIAGNOSIS — H02401 Unspecified ptosis of right eyelid: Secondary | ICD-10-CM | POA: Diagnosis not present

## 2022-12-20 HISTORY — DX: Cerebral infarction, unspecified: I63.9

## 2022-12-20 LAB — DIFFERENTIAL
Abs Immature Granulocytes: 0.04 10*3/uL (ref 0.00–0.07)
Basophils Absolute: 0.1 10*3/uL (ref 0.0–0.1)
Basophils Relative: 1 %
Eosinophils Absolute: 0.2 10*3/uL (ref 0.0–0.5)
Eosinophils Relative: 3 %
Immature Granulocytes: 1 %
Lymphocytes Relative: 46 %
Lymphs Abs: 3.4 10*3/uL (ref 0.7–4.0)
Monocytes Absolute: 0.6 10*3/uL (ref 0.1–1.0)
Monocytes Relative: 7 %
Neutro Abs: 3.1 10*3/uL (ref 1.7–7.7)
Neutrophils Relative %: 42 %

## 2022-12-20 LAB — CBC
HCT: 47.2 % (ref 39.0–52.0)
Hemoglobin: 16.7 g/dL (ref 13.0–17.0)
MCH: 34.2 pg — ABNORMAL HIGH (ref 26.0–34.0)
MCHC: 35.4 g/dL (ref 30.0–36.0)
MCV: 96.7 fL (ref 80.0–100.0)
Platelets: 245 10*3/uL (ref 150–400)
RBC: 4.88 MIL/uL (ref 4.22–5.81)
RDW: 11.7 % (ref 11.5–15.5)
WBC: 7.4 10*3/uL (ref 4.0–10.5)
nRBC: 0 % (ref 0.0–0.2)

## 2022-12-20 LAB — PROTIME-INR
INR: 1 (ref 0.8–1.2)
Prothrombin Time: 12.9 s (ref 11.4–15.2)

## 2022-12-20 LAB — COMPREHENSIVE METABOLIC PANEL
ALT: 35 U/L (ref 0–44)
AST: 38 U/L (ref 15–41)
Albumin: 4.3 g/dL (ref 3.5–5.0)
Alkaline Phosphatase: 69 U/L (ref 38–126)
Anion gap: 10 (ref 5–15)
BUN: 22 mg/dL (ref 8–23)
CO2: 27 mmol/L (ref 22–32)
Calcium: 9.1 mg/dL (ref 8.9–10.3)
Chloride: 100 mmol/L (ref 98–111)
Creatinine, Ser: 0.9 mg/dL (ref 0.61–1.24)
GFR, Estimated: >60 mL/min (ref >60)
Glucose, Bld: 135 mg/dL — ABNORMAL HIGH (ref 70–99)
Potassium: 4.4 mmol/L (ref 3.5–5.1)
Sodium: 137 mmol/L (ref 135–145)
Total Bilirubin: 0.7 mg/dL (ref <1.2)
Total Protein: 7.6 g/dL (ref 6.5–8.1)

## 2022-12-20 LAB — ETHANOL: Alcohol, Ethyl (B): <10 mg/dL (ref <10)

## 2022-12-20 LAB — SEDIMENTATION RATE: Sed Rate: 4 mm/h (ref 0–20)

## 2022-12-20 LAB — APTT: aPTT: 31 s (ref 24–36)

## 2022-12-20 MED ORDER — SODIUM CHLORIDE 0.9% FLUSH: 3.0000 mL | Freq: Once | INTRAVENOUS | Status: DC

## 2022-12-20 NOTE — ED Provider Notes (Signed)
[facility] Provider Note    Event Date/Time   First MD Initiated Contact with Patient 12/20/22 [phone]     (approximate)   History   Eye Droop   HPI  Casey Golden is a 72 y.o. male  hypertension, hyperlipidemia, diabetes mellitus, CAD, GERD, obesity, OSA on CPAP.  Will  Patient noticed yesterday had a little bit of a strange feeling not like a full numbness but just a strange feeling and a little bit swollen above his right eyebrow.  The swelling is gone away and he never saw a rash.  He reports that it just a strange sensation from about the front of his right ear out over the top of his eyebrow.  He feels like it just changes that sensational bit.  He has had no vision changes no difficulty speaking.  Several months ago was admitted with weakness in his right arm and right leg but those have resolved, and he has not seen any symptoms like that.  He has not yet taken his blood pressure or his home medications this morning  No chest pain no trouble breathing.  Relates this discomfort a strange discomfort or a slight change in feeling brought his right eyebrow on the right forehead area.     Physical Exam   Triage Vital Signs: ED Triage Vitals  Encounter Vitals Group     BP 12/20/22 0758 (!) 158/80     Systolic BP Percentile --      Diastolic BP Percentile --      Pulse Rate 12/20/22 0758 62     Resp 12/20/22 0758 18     Temp 12/20/22 0802 98.3 F (36.8 C)     Temp Source 12/20/22 0802 Oral     SpO2 12/20/22 0758 100 %     Weight 12/20/22 0759 240 lb (108.9 kg)     Height 12/20/22 0759 5\' 7"  (1.702 m)     Head Circumference --      Peak Flow --      Pain Score 12/20/22 0758 1     Pain Loc --      Pain Education --      Exclude from Growth Chart --     Most recent vital signs: Vitals:   12/20/22 0758 12/20/22 0802  BP: (!) 158/80   Pulse: 62   Resp: 18   Temp:  98.3 F (36.8 C)  SpO2: 100%      General: Awake, no distress.  No  pronator drift in extremity.  5 out of 5 strength all extremities.  Able to distinguish light touch over all zones of the face, but reports that this feels slightly different over the right forehead above the right eyebrow.  There is no facial droop his tongue protrudes at midline.  His extraocular moods are normal.  His vision is clear.  The anterior chamber of the eye appears clear.  He denies tenderness to palpation over the temporal artery.  There is no facial swelling.  He is normocephalic atraumatic.  He is alert well-oriented.  No obvious focal deficits but he does report paresthesia or other tingly type feeling or a slightly decreased sensation over the right forehead CV:  Good peripheral perfusion.  Resp:  Normal effort.  Abd:  No distention.  Other:     ED Results / Procedures / Treatments   Labs (all labs ordered are listed, but only abnormal results are displayed) Labs Reviewed  CBC - Abnormal; Notable for  the following components:      Result Value   MCH 34.2 (*)    All other components within normal limits  COMPREHENSIVE METABOLIC PANEL - Abnormal; Notable for the following components:   Glucose, Bld 135 (*)    All other components within normal limits  PROTIME-INR  APTT  DIFFERENTIAL  ETHANOL  SEDIMENTATION RATE   Labs interpreted as normal comprehensive metabolic panel.  CBC normal  ESR normal, low likelyhood of acute vascular inflammatory process like giant call   EKG  EKG interpreted by me at 8 AM heart rate 65 QRS 70 QTc 410, normal sinus rhythm.  P waves are somewhat difficult to discern but in lead V1 appears to have P waves consistently with each QRS complex though 1 fusion beat may be observed. No ischemia   RADIOLOGY CT head interpreted by me as negative for acute finding    MR BRAIN WO CONTRAST  Result Date: 12/20/2022 CLINICAL DATA:  Numbness or tingling, paresthesia. Right facial paresthesia, exclude stroke. EXAM: MRI HEAD WITHOUT CONTRAST  TECHNIQUE: Multiplanar, multiecho pulse sequences of the brain and surrounding structures were obtained without intravenous contrast. COMPARISON:  Head CT December 20, 2022; MRI August 12, 2022. FINDINGS: Brain: No acute infarction, hemorrhage, extra-axial collection or mass lesion. Prominence of the supratentorial ventricular system, suggestive of predominantly central parenchymal volume loss. Scattered foci of T2 hyperintensity are seen within the white matter of the cerebral hemispheres, nonspecific, stable. Vascular: Normal flow voids. Skull and upper cervical spine: Normal marrow signal. Sinuses/Orbits: Negative. Other: None. IMPRESSION: 1. No acute intracranial abnormality. 2. Stable mild chronic microvascular ischemic changes of the white matter. 3. Prominence of the supratentorial ventricular system, suggestive of predominantly central parenchymal volume loss. Electronically Signed   By: Baldemar Lenis M.D.   On: 12/20/2022 11:03   CT HEAD WO CONTRAST  Result Date: 12/20/2022 CLINICAL DATA:  72 year old male with right eye droop, right scalp swelling since yesterday morning. Headache. EXAM: CT HEAD WITHOUT CONTRAST TECHNIQUE: Contiguous axial images were obtained from the base of the skull through the vertex without intravenous contrast. RADIATION DOSE REDUCTION: This exam was performed according to the departmental dose-optimization program which includes automated exposure control, adjustment of the mA and/or kV according to patient size and/or use of iterative reconstruction technique. COMPARISON:  Brain MRI 08/12/2022 and earlier. FINDINGS: Brain: No midline shift, ventriculomegaly, mass effect, evidence of mass lesion, intracranial hemorrhage or evidence of cortically based acute infarction. Gray-white matter differentiation is within normal limits throughout the brain. Trache that stable cerebral volume. Mild for age scattered white matter hypodensity appears stable since the July  MRI. Vascular: No suspicious intracranial vascular hyperdensity. Chronic left vertex dural calcifications suspected (series 4, image 28). Calcified atherosclerosis at the skull base. Skull: Stable and intact. Sinuses/Orbits: Visualized paranasal sinuses and mastoids are stable and well aerated. Other: Postoperative changes to both globes are chronic. No acute orbit or scalp soft tissue finding. IMPRESSION: Stable and negative for age non contrast CT appearance of the brain. Electronically Signed   By: Odessa Fleming M.D.   On: 12/20/2022 08:33      PROCEDURES:  Critical Care performed: No  Procedures   MEDICATIONS ORDERED IN ED: Medications - No data to display    IMPRESSION / MDM / ASSESSMENT AND PLAN / ED COURSE  I reviewed the triage vital signs and the nursing notes.  Differential diagnosis includes, but is not limited to, ongoing paresthesia, possible trigeminal symptomatology/neuralgia, no evidence of shingles rash or infection at this time.  Less likely central neurologic cause, but given the patient's clinical history previous admission evaluation I think it would be prudent to obtain an MRI of the brain to exclude small infarct though this seems to be less likely.  Patient not agreeable with plan to move forth MRI.  Labs reassuring.  ESR pending.  No acute cardiac or pulmonary symptoms.  No infectious symptoms.  Reassuring exam  Patient's presentation is most consistent with acute complicated illness / injury requiring diagnostic workup.    ----------------------------------------- 11:15 AM on 12/20/2022 ----------------------------------------- Patient resting comfortably in hallway.  MRI reassuring with no evidence of acute central cause.  ESR normal.  At this point seems to have a very mild paresthesia.  I feel the patient is appropriate for outpatient further care, he will continue on his current medication regimen  Return precautions and treatment  recommendations and follow-up discussed with the patient who is agreeable with the plan.       FINAL CLINICAL IMPRESSION(S) / ED DIAGNOSES   Final diagnoses:  Facial paresthesia     Rx / DC Orders   ED Discharge Orders     None        Note:  This document was prepared using Dragon voice recognition software and may include unintentional dictation errors.   Sharyn Creamer, MD 12/20/22 1115

## 2022-12-20 NOTE — ED Notes (Signed)
Pt. Returned from MRI 

## 2022-12-20 NOTE — ED Triage Notes (Signed)
Patient to ED via POV for right eye drooping that started yesterday morning upon awaking. Pt reports that the temple area on his right side also appeared swollen this AM upon awaking. States he is having a dull headache. Hx of stroke in the last few months. Equal strength in all extremities. Ambulatory to ED. Speech clear.

## 2022-12-24 ENCOUNTER — Ambulatory Visit (INDEPENDENT_AMBULATORY_CARE_PROVIDER_SITE_OTHER): Payer: Medicare Other | Admitting: Family Medicine

## 2022-12-24 ENCOUNTER — Encounter: Payer: Self-pay | Admitting: Family Medicine

## 2022-12-24 VITALS — BP 142/72 | HR 63 | Temp 97.9°F | Ht 67.0 in | Wt 229.2 lb

## 2022-12-24 DIAGNOSIS — R413 Other amnesia: Secondary | ICD-10-CM

## 2022-12-24 LAB — VITAMIN B12: Vitamin B-12: 329 pg/mL (ref 211–911)

## 2022-12-24 LAB — TSH: TSH: 0.91 u[IU]/mL (ref 0.35–5.50)

## 2022-12-24 MED ORDER — GABAPENTIN 300 MG PO CAPS: 300.0000 mg | ORAL_CAPSULE | Freq: Two times a day (BID) | ORAL | Status: DC

## 2022-12-24 NOTE — Patient Instructions (Addendum)
Your memory testing was not normal today.    Go to the lab on the way out.   If you have mychart we'll likely use that to update you.    Take care.  Glad to see you. Let me check with Dr. [name].

## 2022-12-24 NOTE — Progress Notes (Unsigned)
Recent MRI with  IMPRESSION: 1. No acute intracranial abnormality. 2. Stable mild chronic microvascular ischemic changes of the white matter. 3. Prominence of the supratentorial ventricular system, suggestive of predominantly central parenchymal volume loss.   On trulicity, taking gabapentin BID.  He thought he was off atorvastatin, d/w pt.    He is having some short term memory changes.  He is still working part time.  Wife called in the midst of the OV.  Wife has noted some short term changes, repeating questions.   D/w pt about prev HA.  He has less sensation changes now on the R side of the scalp.  Not resolved but clearly better.    Meds, vitals, and allergies reviewed.   ROS: Per HPI unless specifically indicated in ROS section   GEN: nad, alert and oriented except at below.  HEENT: ncat NECK: supple w/o LA CV: rrr. PULM: ctab, no inc wob ABD: soft, +bs EXT: no edema SKIN: well perfused.  CN 2-12 wnl B, S/S/DTR wnl x4 Normal scalp and facial sensation to light tough B  MMSE 24/30, -2 orientation, -1 attention, -3 recall.    32 minutes were devoted to patient care in this encounter (this includes time spent reviewing the patient's file/history, interviewing and examining the patient, counseling/reviewing plan with patient).

## 2022-12-26 DIAGNOSIS — R413 Other amnesia: Secondary | ICD-10-CM | POA: Insufficient documentation

## 2022-12-26 DIAGNOSIS — G3184 Mild cognitive impairment, so stated: Secondary | ICD-10-CM | POA: Insufficient documentation

## 2022-12-26 NOTE — Assessment & Plan Note (Signed)
Abnormal MMSE with wife noting short-term memory changes.  Recent MRI with suggestion of parenchymal volume loss but no acute changes.  Defer repeat imaging at this point but will check relevant labs and update PCP.  The concern is that the patient could have an issue with primary memory loss, assuming his follow-up labs are unremarkable.  We need to get his labs resulted and then go from there.

## 2022-12-31 NOTE — Progress Notes (Signed)
Spoke with pt scheduling Dr Timoteo Expose message. Pt verbalizes understanding and scheduled f/u OV on 03/11/22 at 8:00.

## 2023-01-07 ENCOUNTER — Other Ambulatory Visit: Payer: Self-pay

## 2023-01-07 DIAGNOSIS — E1169 Type 2 diabetes mellitus with other specified complication: Secondary | ICD-10-CM

## 2023-01-07 MED ORDER — ATORVASTATIN CALCIUM 40 MG PO TABS
40.0000 mg | ORAL_TABLET | Freq: Every day | ORAL | 2 refills | Status: DC
Start: 2023-01-07 — End: 2023-01-29
  Filled 2023-01-07: qty 90, 90d supply, fill #0

## 2023-01-07 NOTE — Telephone Encounter (Signed)
E-scribed refill 

## 2023-01-16 ENCOUNTER — Other Ambulatory Visit: Payer: Self-pay

## 2023-01-16 MED FILL — Dulaglutide Soln Auto-injector 0.75 MG/0.5ML: SUBCUTANEOUS | 28 days supply | Qty: 2 | Fill #1 | Status: AC

## 2023-01-17 ENCOUNTER — Telehealth: Payer: Self-pay

## 2023-01-17 NOTE — Telephone Encounter (Signed)
Noted.   I don't see any messages or result notes indicating someone from our office called pt. I do see pt has OV on 01/28/23 at 9:30. Maybe it was a reminder call.

## 2023-01-17 NOTE — Telephone Encounter (Signed)
I did not reach out to patient.

## 2023-01-17 NOTE — Telephone Encounter (Signed)
Copied from CRM [id number]. Topic: General - Call Back - No Documentation >> Jan 17, 2023 10:37 AM Gaetano Hawthorne wrote: Reason for CRM: Patient mentioned that he missed a call from Dr. Sharen Hones yesterday, when he has a moment - he can be reached at [phone].

## 2023-01-28 ENCOUNTER — Encounter: Payer: Self-pay | Admitting: Family Medicine

## 2023-01-28 ENCOUNTER — Ambulatory Visit (INDEPENDENT_AMBULATORY_CARE_PROVIDER_SITE_OTHER): Payer: Medicare Other | Admitting: Family Medicine

## 2023-01-28 VITALS — BP 154/76 | HR 70 | Temp 98.2°F | Ht 67.0 in | Wt 231.0 lb

## 2023-01-28 DIAGNOSIS — E1169 Type 2 diabetes mellitus with other specified complication: Secondary | ICD-10-CM

## 2023-01-28 DIAGNOSIS — I10 Essential (primary) hypertension: Secondary | ICD-10-CM | POA: Diagnosis not present

## 2023-01-28 DIAGNOSIS — E785 Hyperlipidemia, unspecified: Secondary | ICD-10-CM

## 2023-01-28 DIAGNOSIS — M545 Low back pain, unspecified: Secondary | ICD-10-CM

## 2023-01-28 DIAGNOSIS — R413 Other amnesia: Secondary | ICD-10-CM

## 2023-01-28 DIAGNOSIS — H9193 Unspecified hearing loss, bilateral: Secondary | ICD-10-CM

## 2023-01-28 DIAGNOSIS — G8929 Other chronic pain: Secondary | ICD-10-CM

## 2023-01-28 DIAGNOSIS — Z7985 Long-term (current) use of injectable non-insulin antidiabetic drugs: Secondary | ICD-10-CM

## 2023-01-28 LAB — POCT GLYCOSYLATED HEMOGLOBIN (HGB A1C): Hemoglobin A1C: 5.9 % — AB (ref 4.0–5.6)

## 2023-01-28 MED ORDER — VITAMIN B-12 1000 MCG PO TABS: 1000.0000 ug | ORAL_TABLET | Freq: Every day | ORAL | Status: DC

## 2023-01-28 NOTE — Assessment & Plan Note (Addendum)
Chronic, deteriorated with BP above goal.  He is unsure if he's taking losartan 50mg  at home - will verify and let me know to titrate meds accordingly.

## 2023-01-28 NOTE — Patient Instructions (Addendum)
Ensure that you're taking losartan 50mg  daily - medicine for blood pressure as your bp was elevated today.  Sugar is doing great - continue Trulicity  For back pain -likely muscle strain - treat with heating pad to area covered in a towel for no more than 15-20 min at a time.  For memory - continue vitamin b12, we could consider starting memory medicine called Aricept (donepezil) or refer you to a memory doctor (neurologist). Keep February appointment for follow up, bring your wife.  We will work on Management consultant.

## 2023-01-28 NOTE — Progress Notes (Signed)
Ph: [phone] Fax: [phone]   Patient ID: Casey Golden, male    DOB: 1950/10/23, 72 y.o.   MRN: [id number]  This visit was conducted in person.  BP (!) 154/76 (BP Location: Right Arm, Cuff Size: Large)   Pulse 70   Temp 98.2 F (36.8 C) (Oral)   Ht 5\' 7"  (1.702 m)   Wt 231 lb (104.8 kg)   SpO2 97%   BMI 36.18 kg/m   BP Readings from Last 3 Encounters:  01/28/23 (!) 154/76  12/24/22 (!) 142/72  12/20/22 (!) 137/59    CC: several concerns  Subjective:   HPI: Casey Golden is a 72 y.o. male presenting on 01/28/2023 for Hearing Problem (C/o decreased hearing despite wearing hearing aids- declines to have hearing checked today. However, requests a letter, for work, stating recent pair of hearing aids are indeed hearing aids and not for listening to music. Followed by audiology. ) and Leg Pain (C/o L upper leg burning and L side of head. Started about 1 wk ago. )   Seen by Dr. Para March in November with some concern for short-term memory changes noted by wife.  MMSE at the time was 24 out of 30, TSH normal, B12 low normal at 329-started on B12 replacement 1000 mcg daily - he continues taking this.  He was seen a few days prior at [facility] ER with paresthesias above his right eyebrow with unrevealing evaluation including head CT and brain MRI.  Records reviewed. Brain MRI did show mild chronic microvascular ischemic changes and prominence of the supratentorial ventricular system suggestive of central parenchymal volume loss.   1 wk h/o discomfort to R lower back, last night developed burning discomfort to left lateral knee and to left temple region - this woke him up from sleep. Lasted 15-20 min, but he continues to feel lateral knee pain.   HTN - bp elevated today - he's not been taking losartan 50mg  daily.   Hearing loss - has tried 3 different hearing aides with limited benefit. Has seen audiologists in [place]. Requests letter for work saying he uses hearing  aides. 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 Requests work accomodation form filled out to be able to use his hearing aides.   DM - continues Trulicity 0.75mg  weekly. Metformin stopped due to great sugar control only on GLP1RA.  Lab Results  Component Value Date   HGBA1C 5.9 (A) 01/28/2023    Requests meds refilled     Relevant past medical, surgical, family and social history reviewed and updated as indicated. Interim medical history since our last visit reviewed. Allergies and medications reviewed and updated. Outpatient Medications Prior to Visit  Medication Sig Dispense Refill   Accu-Chek Softclix Lancets lancets Use to check blood glucose 3 times a day 100 each 0   aspirin 81 MG EC tablet Take 1 tablet (81 mg total) by mouth daily. 30 tablet 12   Blood Glucose Monitoring Suppl (BLOOD GLUCOSE MONITOR SYSTEM) w/Device KIT Use to check blood sugar 3 (three) times daily. 1 kit 0   diclofenac sodium (VOLTAREN) 1 % GEL APPLY 2 G TOPICALLY 3 (THREE) TIMES DAILY AS NEEDED (ANTI INFLAMMATORY). 100 g 1   Dulaglutide (TRULICITY) 0.75 MG/0.5ML SOAJ Inject 0.75 mg into the skin once a week. 2 mL 6   glucose blood test strip Use to check blood glucose 3 times a day 100 each 0   ibuprofen (ADVIL,MOTRIN) 200 MG tablet Take 200-600 mg  by mouth every 8 (eight) hours as needed for mild pain (severe back pain.).     Lancets (ONETOUCH DELICA PLUS LANCET33G) MISC Use to check blood glucose 3 (three) times daily. 100 each 0   atorvastatin (LIPITOR) 40 MG tablet Take 1 tablet (40 mg total) by mouth daily. 90 tablet 2   gabapentin (NEURONTIN) 300 MG capsule Take 1 capsule (300 mg total) by mouth 2 (two) times daily.     losartan (COZAAR) 50 MG tablet Take 1 tablet (50 mg total) by mouth daily. 90 tablet 1   nitroGLYCERIN (NITROSTAT) 0.4 MG SL tablet Place 1 tablet (0.4 mg total) under the tongue every 5 (five) minutes as needed for chest pain (do not take more than 3  doses.). 50 tablet 1   No facility-administered medications prior to visit.     Per HPI unless specifically indicated in ROS section below Review of Systems  Objective:  BP (!) 154/76 (BP Location: Right Arm, Cuff Size: Large)   Pulse 70   Temp 98.2 F (36.8 C) (Oral)   Ht 5\' 7"  (1.702 m)   Wt 231 lb (104.8 kg)   SpO2 97%   BMI 36.18 kg/m   Wt Readings from Last 3 Encounters:  01/28/23 231 lb (104.8 kg)  12/24/22 229 lb 3.2 oz (104 kg)  12/20/22 240 lb (108.9 kg)      Physical Exam Vitals and nursing note reviewed.  Constitutional:      Appearance: Normal appearance. He is not ill-appearing.  HENT:     Right Ear: Tympanic membrane and ear canal normal. Decreased hearing noted.     Left Ear: Tympanic membrane and ear canal normal. Decreased hearing noted.     Mouth/Throat:     Mouth: Mucous membranes are moist.     Pharynx: Oropharynx is clear. No oropharyngeal exudate or posterior oropharyngeal erythema.  Eyes:     Extraocular Movements: Extraocular movements intact.     Pupils: Pupils are equal, round, and reactive to light.  Cardiovascular:     Rate and Rhythm: Normal rate and regular rhythm.     Pulses: Normal pulses.     Heart sounds: Normal heart sounds. No murmur heard. Pulmonary:     Effort: Pulmonary effort is normal. No respiratory distress.     Breath sounds: Normal breath sounds. No wheezing, rhonchi or rales.  Musculoskeletal:        General: Tenderness present.       Arms:     Right lower leg: No edema.     Left lower leg: No edema.     Comments: Tender to palpation R lateral lower back, no midline spine tenderness, neg seated SLR  Skin:    General: Skin is warm and dry.     Findings: No rash.  Neurological:     Mental Status: He is alert.  Psychiatric:        Mood and Affect: Mood normal.        Behavior: Behavior normal.       Results for orders placed or performed in visit on 01/28/23  POCT glycosylated hemoglobin (Hb A1C)   Collection  Time: 01/28/23  9:55 AM  Result Value Ref Range   Hemoglobin A1C 5.9 (A) 4.0 - 5.6 %   HbA1c POC (<> result, manual entry)     HbA1c, POC (prediabetic range)     HbA1c, POC (controlled diabetic range)     Lab Results  Component Value Date   VITAMINB12 329 12/24/2022  Lab Results  Component Value Date   TSH 0.91 12/24/2022   Assessment & Plan:   Problem List Items Addressed This Visit     HTN (hypertension)   Chronic, deteriorated with BP above goal.  He is unsure if he's taking losartan 50mg  at home - will verify and let me know to titrate meds accordingly.       Relevant Medications   losartan (COZAAR) 50 MG tablet   atorvastatin (LIPITOR) 40 MG tablet   Type 2 diabetes mellitus with other specified complication (HCC) - Primary   Chronic, stable only on Trulicity - continue.  Metformin was discontinued due to great glycemic control       Relevant Medications   losartan (COZAAR) 50 MG tablet   atorvastatin (LIPITOR) 40 MG tablet   Other Relevant Orders   POCT glycosylated hemoglobin (Hb A1C) (Completed)   Dyslipidemia associated with type 2 diabetes mellitus (HCC)   Relevant Medications   losartan (COZAAR) 50 MG tablet   atorvastatin (LIPITOR) 40 MG tablet   Chronic midline low back pain   Relevant Medications   gabapentin (NEURONTIN) 300 MG capsule   Memory change   Seen last month with concern for this, wife endorsed short term memory loss. Memory labs revealed low normal b12 - he has started b12 replacement.  Previous brain imaging also overall reassuring, did show show mild chronic microvascular ischemic changes and prominence of the supratentorial ventricular system suggestive of central parenchymal volume loss. Discussed with patient as well as treatment options including memory medication like aricept vs neurology evaluation.  I asked him to return at next scheduled appointment in February along with wife for reevaluation.      Acute right-sided low  back pain   Right lateral low back is site of pain  Anticipate muscle strain  Supportive measures reviewed  Update if not improving with this.       Decreased hearing of both ears   ADA work accomodation form filled out for patient to be able to use hearing aides at work.         Meds ordered this encounter  Medications   cyanocobalamin (VITAMIN B12) 1000 MCG tablet    Sig: Take 1 tablet (1,000 mcg total) by mouth daily.   losartan (COZAAR) 50 MG tablet    Sig: Take 1 tablet (50 mg total) by mouth daily.    Dispense:  90 tablet    Refill:  3   gabapentin (NEURONTIN) 300 MG capsule    Sig: Take 1 capsule (300 mg total) by mouth 2 (two) times daily.    Dispense:  180 capsule    Refill:  3   atorvastatin (LIPITOR) 40 MG tablet    Sig: Take 1 tablet (40 mg total) by mouth daily.    Dispense:  90 tablet    Refill:  3    Orders Placed This Encounter  Procedures   POCT glycosylated hemoglobin (Hb A1C)    Patient Instructions  Ensure that you're taking losartan 50mg  daily - medicine for blood pressure as your bp was elevated today.  Sugar is doing great - continue Trulicity  For back pain -likely muscle strain - treat with heating pad to area covered in a towel for no more than 15-20 min at a time.  For memory - continue vitamin b12, we could consider starting memory medicine called Aricept (donepezil) or refer you to a memory doctor (neurologist). Keep February appointment for follow up, bring your wife.  We  will work on Management consultant.   Follow up plan: No follow-ups on file.  Eustaquio Boyden, MD

## 2023-01-28 NOTE — Assessment & Plan Note (Addendum)
Chronic, stable only on Trulicity - continue.  Metformin was discontinued due to great glycemic control

## 2023-01-29 ENCOUNTER — Encounter: Payer: Self-pay | Admitting: Family Medicine

## 2023-01-29 ENCOUNTER — Other Ambulatory Visit: Payer: Self-pay

## 2023-01-29 DIAGNOSIS — H9193 Unspecified hearing loss, bilateral: Secondary | ICD-10-CM | POA: Insufficient documentation

## 2023-01-29 DIAGNOSIS — M545 Low back pain, unspecified: Secondary | ICD-10-CM | POA: Insufficient documentation

## 2023-01-29 MED ORDER — ATORVASTATIN CALCIUM 40 MG PO TABS
40.0000 mg | ORAL_TABLET | Freq: Every day | ORAL | 3 refills | Status: DC
  Filled 2023-01-29 – 2023-04-02 (×2): qty 90, 90d supply, fill #0
  Filled 2023-06-12: qty 90, 90d supply, fill #1
  Filled 2023-09-02: qty 90, 90d supply, fill #2
  Filled 2023-12-21: qty 90, 90d supply, fill #3

## 2023-01-29 MED ORDER — GABAPENTIN 300 MG PO CAPS
300.0000 mg | ORAL_CAPSULE | Freq: Two times a day (BID) | ORAL | 3 refills | Status: DC
  Filled 2023-01-29: qty 180, 90d supply, fill #0
  Filled 2023-05-11: qty 180, 90d supply, fill #1

## 2023-01-29 MED ORDER — LOSARTAN POTASSIUM 50 MG PO TABS
50.0000 mg | ORAL_TABLET | Freq: Every day | ORAL | 3 refills | Status: DC
Start: 2023-01-29 — End: 2023-03-06
  Filled 2023-01-29: qty 90, 90d supply, fill #0

## 2023-01-29 NOTE — Assessment & Plan Note (Signed)
ADA work accomodation form filled out for patient to be able to use hearing aides at work.

## 2023-01-29 NOTE — Assessment & Plan Note (Signed)
 Seen last month with concern for this, wife endorsed short term memory loss. Memory labs revealed low normal b12 - he has started b12 1000mcg replacement.  Previous brain imaging also overall reassuring, did show show mild chronic microvascular ischemic changes and prominence of the supratentorial ventricular system suggestive of central parenchymal volume loss. Discussed with patient as well as treatment options including memory medication like aricept  vs neurology evaluation.  I asked him to return at next scheduled appointment in February along with wife for reevaluation.

## 2023-01-29 NOTE — Assessment & Plan Note (Signed)
Right lateral low back is site of pain  Anticipate muscle strain  Supportive measures reviewed  Update if not improving with this.

## 2023-02-05 ENCOUNTER — Other Ambulatory Visit: Payer: Self-pay

## 2023-02-11 ENCOUNTER — Other Ambulatory Visit: Payer: Self-pay

## 2023-02-11 MED FILL — Dulaglutide Soln Auto-injector 0.75 MG/0.5ML: SUBCUTANEOUS | 28 days supply | Qty: 2 | Fill #2 | Status: AC

## 2023-02-20 ENCOUNTER — Telehealth: Payer: Self-pay

## 2023-02-20 NOTE — Telephone Encounter (Signed)
Copied from CRM [id number]. Topic: Clinical - Prescription Issue >> Feb 20, 2023  3:30 PM Adaysia C wrote: Reason for CRM: Patients insurance([employer]) has called to speak with provider about the prior authorization request for Trulicity. The patients insurance has requested the patients diagnoses and chart notes. Patients insurance has requested a follow up to provide this information #[phone](option 2).

## 2023-02-21 NOTE — Telephone Encounter (Signed)
Received faxed form from [employer] requesting additional info.   Faxed all requested info to [employer] at [phone].

## 2023-02-28 ENCOUNTER — Encounter: Payer: Self-pay | Admitting: Family Medicine

## 2023-02-28 ENCOUNTER — Ambulatory Visit: Payer: HMO | Admitting: Family Medicine

## 2023-02-28 ENCOUNTER — Other Ambulatory Visit: Payer: Self-pay

## 2023-02-28 VITALS — BP 135/65 | HR 72 | Temp 98.0°F | Ht 67.0 in | Wt 230.0 lb

## 2023-02-28 DIAGNOSIS — L03211 Cellulitis of face: Secondary | ICD-10-CM | POA: Diagnosis not present

## 2023-02-28 DIAGNOSIS — I10 Essential (primary) hypertension: Secondary | ICD-10-CM | POA: Diagnosis not present

## 2023-02-28 DIAGNOSIS — R519 Headache, unspecified: Secondary | ICD-10-CM | POA: Insufficient documentation

## 2023-02-28 MED ORDER — CEPHALEXIN 500 MG PO CAPS
500.0000 mg | ORAL_CAPSULE | Freq: Three times a day (TID) | ORAL | 0 refills | Status: DC
  Filled 2023-02-28: qty 21, 7d supply, fill #0

## 2023-02-28 MED FILL — Dulaglutide Soln Auto-injector 0.75 MG/0.5ML: SUBCUTANEOUS | 28 days supply | Qty: 2 | Fill #3 | Status: AC

## 2023-02-28 NOTE — Patient Instructions (Addendum)
You may have a skin infection of the cheek/face  Keep check/face clean with soap and water  Take generic keflex as directed for skin infection   Watch for  Extension of redness or swelling or pain  Rash with tiny blisters on face or neck or head (that can indicate shingles)  Pain in eye / redness of eye or change in vision  Facial droop   If any severe symptoms-go to the ER   If symptoms worsen - call us asap   Let's set up follow up for re check early next week  Stop on way out to make that appointment

## 2023-02-28 NOTE — Progress Notes (Signed)
Subjective:    Patient ID: Casey Golden, male    DOB: 05/18/1950, 73 y.o.   MRN: [id number]  HPI  Wt Readings from Last 3 Encounters:  02/28/23 230 lb (104.3 kg)  01/28/23 231 lb (104.8 kg)  12/24/22 229 lb 3.2 oz (104 kg)   36.02 kg/m  Vitals:   02/28/23 1120 02/28/23 1153  BP: (!) 144/70 135/65  Pulse: 72   Temp: 98 F (36.7 C)   SpO2: 96%    73 yo pf of Dr Casey Golden presents for left cheek pain/redness/swelling    Woke up with a burning sensation of left face  Eye is swollen Cheek is swollen  Feels al little funny in left side of scalp   Unsure if vision is changed  Wears glasses just to read  No contact lenses  Eye burns a bit Not red or watery this am   No cold symptoms recently  Tends to get a a little congested    Has never had shingles before  Is vaccinated    Does not wear a cpap    No fever  Feels a little tired/under the weather        History of HTN and DM and Padget's disease (skin) Lab Results  Component Value Date   HGBA1C 5.9 (A) 01/28/2023   Lab Results  Component Value Date   NA 137 12/20/2022   K 4.4 12/20/2022   CO2 27 12/20/2022   GLUCOSE 135 (H) 12/20/2022   BUN 22 12/20/2022   CREATININE 0.90 12/20/2022   CALCIUM 9.1 12/20/2022   GFR 85.13 08/15/2022   GFRNONAA >60 12/20/2022   Lab Results  Component Value Date   WBC 7.4 12/20/2022   HGB 16.7 12/20/2022   HCT 47.2 12/20/2022   MCV 96.7 12/20/2022   PLT 245 12/20/2022       Patient Active Problem List   Diagnosis Date Noted   Cellulitis of external cheek, left 02/28/2023   Acute right-sided low back pain 01/29/2023   Decreased hearing of both ears 01/29/2023   Memory change 12/26/2022   Dizziness 08/12/2022   Vertigo 05/27/2022   Diminished pulses in lower extremity 01/08/2022   First degree AV block 11/25/2020   Adverse reaction to pneumococcal vaccine 09/13/2020   Cerebrovascular small vessel disease 09/13/2020   Porokeratosis 05/16/2020   Plantar  flexed metatarsal, left 05/16/2020   Plantar flexed metatarsal, right 05/16/2020   Polyp of transverse colon    Medicare annual wellness visit, subsequent 05/27/2019   Centrilobular emphysema (HCC) 02/05/2019   Pulmonary nodule less than 6 mm determined by computed tomography of lung 11/06/2018   Atherosclerosis of aorta (HCC) 11/05/2018   Extramammary Paget's disease 10/24/2018   Pain due to onychomycosis of toenails of both feet 07/31/2018   Osteoarthritis of knee 05/23/2017   Ex-smoker 03/27/2017   Elevated PSA 03/27/2017   Nonintractable headache 11/22/2016   Skin lesion 05/15/2016   Hx of colonic polyps    Benign neoplasm of transverse colon    Polyp of sigmoid colon    Benign neoplasm of descending colon    Chronic midline low back pain 02/27/2016   Bilateral hip pain 11/02/2015   Right knee pain 07/12/2015   Health maintenance examination 03/07/2015   Obesity, Class I, BMI 30-34.9 03/07/2015   Advanced care planning/counseling discussion 03/07/2015   Type 2 diabetes mellitus with other specified complication (HCC) 05/06/2014   Dyslipidemia associated with type 2 diabetes mellitus (HCC) 05/06/2014   OSA (obstructive sleep apnea)  05/06/2014   Fatty liver disease, nonalcoholic 01/29/2013   HTN (hypertension) 02/15/2009   CAD (coronary artery disease), native coronary artery 02/15/2009   Past Medical History:  Diagnosis Date   Arthritis    CAD (coronary artery disease)    cathx3 with nonobstructive disease. Last cath with RCA 40% stenosis in 2009   Cancer [facility])    Cataract 2019   bilateral; resolved with surgery   Colonic polyp    Fatty liver disease, nonalcoholic 2015   by [place]   GERD (gastroesophageal reflux disease)    History of chicken pox    Hyperlipidemia    Hypertension    Nocturia    OSA (obstructive sleep apnea)    CPAP, compliant   Paget's disease of bony pelvis    Past use of tobacco    Quit 1990, 70 pack year history   Rash of genital area     09-08-2014  per pt Dr Mena Goes aware   Seasonal and perennial allergic rhinitis    Stroke (HCC)    Type 2 diabetes mellitus (HCC)    Wears dentures    full upper/  partial lower   Wears glasses    Past Surgical History:  Procedure Laterality Date   ABDOMINAL HERNIA REPAIR  2007      [facility]   open repair   APPENDECTOMY  age 41   CARDIAC CATHETERIZATION  12-23-2007   [facility]   Abnormal myoview w/ ischemia/  40% mRCA with nonobstructive and no sig. plaque in his left system, EF 55%   CARDIAC CATHETERIZATION  Apr 2008    [facility]   Abnormal myoview/  50% RCA,  ef 65%   CARDIAC CATHETERIZATION  1999      [facility]   CATARACT EXTRACTION, BILATERAL Bilateral 11/2017   CERVICAL FUSION  1992   CHOLECYSTECTOMY OPEN  2006   COLONOSCOPY WITH PROPOFOL N/A 04/17/2016   TAs, high grade dysplasia with margins clear, diverticulosis Midge Minium, MD)   COLONOSCOPY WITH PROPOFOL N/A 08/07/2016   TAx1, diverticulosis, rpt 3 yrs ([facility])   COLONOSCOPY WITH PROPOFOL N/A 01/05/2020   SSP, rpt Servando Snare, Darren, MD)   DENTAL SURGERY     metal dental implant L mandible   EXCISION OF SKIN TAG Right 09/14/2014   Procedure: EXCISION OF SKIN TAG;  Surgeon: Jerilee Field, MD;  Location: [facility];  Service: Urology;  Laterality: Right;   GROIN MASS OPEN BIOPSY Left 01/17/2019   HYDROCELE EXCISION Left 09/14/2014   Procedure: LEFT HYDROCELECTOMY ADULT;  Surgeon: Jerilee Field, MD;  Location[facility];  Service: Urology;  Laterality: Left;   LEFT HEART CATH AND CORONARY ANGIOGRAPHY Left 02/15/2021   Procedure: LEFT HEART CATH AND CORONARY ANGIOGRAPHY;  Surgeon: Yvonne Kendall, MD;  Location: [facility] INVASIVE CV LAB;  Service: Cardiovascular;  Laterality: Left;   MOHS SURGERY  2015   skin cancer   TONSILLECTOMY  age 66   Social History   Tobacco Use   Smoking status: Former    Current packs/day: 0.00    Average packs/day: 2.0 packs/day for 35.0 years (70.0 ttl pk-yrs)    Types: Cigarettes     Start date: 01/30/1955    Quit date: 01/29/1990    Years since quitting: 33.1   Smokeless tobacco: Never  Vaping Use   Vaping status: Never Used  Substance Use Topics   Alcohol use: No   Drug use: No   Family History  Problem Relation Age of Onset   CAD Mother  MI   Diabetes Mother    Stroke Mother        mini-stroke   Cancer Father 73       lung (smoker)   Cancer Sister        lung   Heart disease Paternal Grandmother    Heart disease Paternal Grandfather    Diabetes Paternal Grandfather    Coronary artery disease Neg Hx        Premature   Sleep apnea Neg Hx    Allergies  Allergen Reactions   Elemental Sulfur    Imdur [Isosorbide Nitrate]     H/A, "Sick", could not get out of bed   Sulfa Antibiotics Hives   Current Outpatient Medications on File Prior to Visit  Medication Sig Dispense Refill   Accu-Chek Softclix Lancets lancets Use to check blood glucose 3 times a day 100 each 0   aspirin 81 MG EC tablet Take 1 tablet (81 mg total) by mouth daily. 30 tablet 12   atorvastatin (LIPITOR) 40 MG tablet Take 1 tablet (40 mg total) by mouth daily. 90 tablet 3   Blood Glucose Monitoring Suppl (BLOOD GLUCOSE MONITOR SYSTEM) w/Device KIT Use to check blood sugar 3 (three) times daily. 1 kit 0   cyanocobalamin (VITAMIN B12) 1000 MCG tablet Take 1 tablet (1,000 mcg total) by mouth daily.     diclofenac sodium (VOLTAREN) 1 % GEL APPLY 2 G TOPICALLY 3 (THREE) TIMES DAILY AS NEEDED (ANTI INFLAMMATORY). 100 g 1   Dulaglutide (TRULICITY) 0.75 MG/0.5ML SOAJ Inject 0.75 mg into the skin once a week. 2 mL 6   gabapentin (NEURONTIN) 300 MG capsule Take 1 capsule (300 mg total) by mouth 2 (two) times daily. 180 capsule 3   glucose blood test strip Use to check blood glucose 3 times a day 100 each 0   ibuprofen (ADVIL,MOTRIN) 200 MG tablet Take 200-600 mg by mouth every 8 (eight) hours as needed for mild pain (severe back pain.).     Lancets (ONETOUCH DELICA PLUS LANCET33G) MISC Use to  check blood glucose 3 (three) times daily. 100 each 0   losartan (COZAAR) 50 MG tablet Take 1 tablet (50 mg total) by mouth daily. 90 tablet 3   nitroGLYCERIN (NITROSTAT) 0.4 MG SL tablet Place 1 tablet (0.4 mg total) under the tongue every 5 (five) minutes as needed for chest pain (do not take more than 3 doses.). 50 tablet 1   No current facility-administered medications on file prior to visit.    Review of Systems     Objective:   Physical Exam Constitutional:      General: He is not in acute distress.    Appearance: Normal appearance. He is obese. He is not ill-appearing or diaphoretic.  HENT:     Head: Normocephalic and atraumatic.     Right Ear: Tympanic membrane and ear canal normal.     Left Ear: Tympanic membrane and ear canal normal.     Nose: Nose normal.     Mouth/Throat:     Mouth: Mucous membranes are moist.     Pharynx: Oropharynx is clear.     Comments: No lesions or swelling in mough Eyes:     General: No visual field deficit or scleral icterus.       Right eye: No discharge.        Left eye: No discharge.     Conjunctiva/sclera: Conjunctivae normal.     Pupils: Pupils are equal, round, and reactive to light.  Cardiovascular:  Rate and Rhythm: Normal rate and regular rhythm.  Pulmonary:     Effort: Pulmonary effort is normal. No respiratory distress.     Breath sounds: Normal breath sounds. No wheezing or rales.  Musculoskeletal:     Cervical back: Neck supple. No tenderness.     Comments: Walks significantly hunched forward  Per pt baseline   Lymphadenopathy:     Cervical: No cervical adenopathy.  Skin:    Coloration: Skin is not jaundiced.     Findings: Erythema present. No bruising.     Comments: 1.5 by 2.5 oval area of erythema around left cheek bone under eye , with mild swelling  No erythema of eyelids or eye  No swelling of upper eyelid   No vesicles or papules No open areas or drainage   No rash or erythema of scalp Solar  damage/keratoses noted on scalp  Neurological:     Mental Status: He is alert.     Cranial Nerves: Cranial nerves 2-12 are intact. No cranial nerve deficit, dysarthria or facial asymmetry.     Sensory: Sensation is intact.     Motor: No weakness, atrophy or abnormal muscle tone.     Gait: Gait is intact.  Psychiatric:        Mood and Affect: Mood normal.     Comments: Pleasant Talkative             Assessment & Plan:   Problem List Items Addressed This Visit       Cardiovascular and Mediastinum   HTN (hypertension)   Blood pressure better on 2nd check today  BP: 135/65   No change in medications         Musculoskeletal and Integument   Cellulitis of external cheek, left - Primary   Small area of erythema and swelling of left cheekbone area in pt with well controlled dm  See pic in chart No rash/vesicles  Eye does not appear to be involved   Otherwise reassuring exam and neuro exam Prescription keflex 500 mg tid  Warm compress/keep clean/ see AVS   Call back and Er precautions noted in detail today   Watching for rash (is immunized for zoster) , worsening of redness or swelling or any eye involvement   Will plan follow up early next week

## 2023-02-28 NOTE — Assessment & Plan Note (Addendum)
Small area of erythema and swelling of left cheekbone area in pt with well controlled dm  See pic in chart No rash/vesicles  Eye does not appear to be involved   Otherwise reassuring exam and neuro exam Prescription keflex 500 mg tid  Warm compress/keep clean/ see AVS   Call back and Er precautions noted in detail today   Watching for rash (is immunized for zoster) , worsening of redness or swelling or any eye involvement   Will plan follow up early next week

## 2023-02-28 NOTE — Assessment & Plan Note (Signed)
Blood pressure better on 2nd check today  BP: 135/65   No change in medications

## 2023-03-01 ENCOUNTER — Ambulatory Visit: Payer: HMO | Admitting: Family Medicine

## 2023-03-05 ENCOUNTER — Encounter: Payer: Self-pay | Admitting: Podiatry

## 2023-03-05 ENCOUNTER — Ambulatory Visit: Payer: PPO | Admitting: Podiatry

## 2023-03-05 VITALS — Ht 67.0 in | Wt 230.0 lb

## 2023-03-05 DIAGNOSIS — E1169 Type 2 diabetes mellitus with other specified complication: Secondary | ICD-10-CM | POA: Diagnosis not present

## 2023-03-05 DIAGNOSIS — B351 Tinea unguium: Secondary | ICD-10-CM | POA: Diagnosis not present

## 2023-03-05 DIAGNOSIS — M2041 Other hammer toe(s) (acquired), right foot: Secondary | ICD-10-CM | POA: Diagnosis not present

## 2023-03-05 DIAGNOSIS — M79675 Pain in left toe(s): Secondary | ICD-10-CM

## 2023-03-05 DIAGNOSIS — M79674 Pain in right toe(s): Secondary | ICD-10-CM | POA: Diagnosis not present

## 2023-03-05 DIAGNOSIS — M2042 Other hammer toe(s) (acquired), left foot: Secondary | ICD-10-CM | POA: Diagnosis not present

## 2023-03-05 NOTE — Progress Notes (Signed)
  Subjective:  Patient ID: Casey Golden, male    DOB: September 04, 1950,  MRN: [id number]  Chief Complaint  Patient presents with   Nail Problem    Pt is here for nail trim and is interested in getting some diabetic shoes.   73 y.o. male returns for the above complaint.  Patient presents with thickened elongated dystrophic mycotic toenails x 10 mild pain on palpation hurts with ambulation worse with pressure patient would like to have them debride down he is not able to do it himself denies any other acute complaints he would also like to get diabetic shoes has been greater than 1 year prior to his diabetes.  Objective:  There were no vitals filed for this visit. Podiatric Exam: Vascular: dorsalis pedis and posterior tibial pulses are palpable bilateral. Capillary return is immediate. Temperature gradient is WNL. Skin turgor WNL  Sensorium: Normal Semmes Weinstein monofilament test. Normal tactile sensation bilaterally. Nail Exam: Pt has thick disfigured discolored nails with subungual debris noted bilateral entire nail hallux through fifth toenails.  Pain on palpation to the nails.SABRA Gentry noted bilaterally semiflexible in nature Ulcer Exam: There is no evidence of ulcer or pre-ulcerative changes or infection. Orthopedic Exam: Muscle tone and strength are WNL. No limitations in general ROM. No crepitus or effusions noted.  Skin: No Porokeratosis. No infection or ulcers    Assessment & Plan:   1. Pain due to onychomycosis of toenails of both feet   2. Hammertoe, bilateral   3. Type 2 diabetes mellitus with other specified complication, without long-term current use of insulin  [facility])     Patient was evaluated and treated and all questions answered.  Hammertoe bilateral -All questions and concerns were discussed with the patient extensively given the presence of hammertoe in the setting of diabetes patient is a high risk of developing ulceration he will benefit from diabetic shoes he was  scheduled with Trish to get diabetic shoes Onychomycosis with pain  -Nails palliatively debrided as below. -Educated on self-care  Procedure: Nail Debridement Rationale: pain  Type of Debridement: manual, sharp debridement. Instrumentation: Nail nipper, rotary burr. Number of Nails: 10  Procedures and Treatment: Consent by patient was obtained for treatment procedures. The patient understood the discussion of treatment and procedures well. All questions were answered thoroughly reviewed. Debridement of mycotic and hypertrophic toenails, 1 through 5 bilateral and clearing of subungual debris. No ulceration, no infection noted.  Return Visit-Office Procedure: Patient instructed to return to the office for a follow up visit 3 months for continued evaluation and treatment.  Franky Blanch, DPM    No follow-ups on file.

## 2023-03-06 ENCOUNTER — Other Ambulatory Visit: Payer: Self-pay

## 2023-03-06 ENCOUNTER — Ambulatory Visit: Payer: PPO | Admitting: Family Medicine

## 2023-03-06 ENCOUNTER — Encounter: Payer: Self-pay | Admitting: Family Medicine

## 2023-03-06 VITALS — BP 150/74 | HR 75 | Temp 98.5°F | Ht 67.0 in | Wt 229.2 lb

## 2023-03-06 DIAGNOSIS — L03211 Cellulitis of face: Secondary | ICD-10-CM

## 2023-03-06 DIAGNOSIS — R413 Other amnesia: Secondary | ICD-10-CM

## 2023-03-06 DIAGNOSIS — I10 Essential (primary) hypertension: Secondary | ICD-10-CM

## 2023-03-06 MED ORDER — NITROGLYCERIN 0.4 MG SL SUBL
0.4000 mg | SUBLINGUAL_TABLET | SUBLINGUAL | 0 refills | Status: DC | PRN
  Filled 2023-03-06: qty 50, 10d supply, fill #0

## 2023-03-06 MED ORDER — LOSARTAN POTASSIUM 100 MG PO TABS
100.0000 mg | ORAL_TABLET | Freq: Every day | ORAL | 3 refills | Status: DC
  Filled 2023-03-06: qty 90, 90d supply, fill #0
  Filled 2023-07-22: qty 90, 90d supply, fill #1
  Filled 2023-08-31 – 2023-10-13 (×2): qty 90, 90d supply, fill #2
  Filled 2024-01-12: qty 90, 90d supply, fill #3

## 2023-03-06 NOTE — Assessment & Plan Note (Addendum)
 MMSE 24/30 (11/2022)  I did encourage he keep f/u appt for next week for further evaluation, and bring all meds along with his wife.

## 2023-03-06 NOTE — Assessment & Plan Note (Addendum)
 Chronic BP remaining above ideal - will increase losartan  to 100mg  daily.  Reassess at f/u visit next week, likely update BMP at that time.

## 2023-03-06 NOTE — Patient Instructions (Addendum)
 Ensure you finish all Keflex  (cephalexin ) antibiotic prescribed.  I want you to use warm compresses three times a day to left cheek to stimulate circulation. Good to see you today.  BP remaining too high. Increase losartan  to 100mg  daily (you may take 2 tablets of the 50mg  dose until you run out, higher dose will be at pharmacy).  Monitor blood pressure readings at home over the next week and jot down, then bring in log sheet filled out (provided today)  Keep appointment next week on Tuesday - bring all your medicines, and bring your wife.

## 2023-03-06 NOTE — Assessment & Plan Note (Signed)
 Overall improved after keflex  course. Encouraged he fully finish this then start warm compresses to area.  No need for ongoing antibiotic. Not consistent with shingles. Did see eye doctor - with reassuring evaluation.  Will reassess at follow up visit next week.

## 2023-03-06 NOTE — Progress Notes (Signed)
 Ph: [phone] Fax: [phone]   Patient ID: Casey Golden, male    DOB: 1950/05/01, 73 y.o.   MRN: [id number]  This visit was conducted in person.  BP (!) 150/74 (BP Location: Right Arm, Cuff Size: Large)   Pulse 75   Temp 98.5 F (36.9 C) (Oral)   Ht 5' 7 (1.702 m)   Wt 229 lb 4 oz (104 kg)   SpO2 98%   BMI 35.91 kg/m   BP Readings from Last 3 Encounters:  03/06/23 (!) 150/74  02/28/23 135/65  01/28/23 (!) 154/76    CC: follow up visit  Subjective:   HPI: Casey Golden is a 73 y.o. male presenting on 03/06/2023 for Medical Management of Chronic Issues (Here for f/u of facial redness. )   HTN - BP persistently elevated. Last visit he was unsure if he was taking losartan  50mg  at home, plan was to verify this and let me know - he states he is taking this at home, prescription seems to have been dispensed #90 on 02/05/2023.   Seen last week by Dr Randeen with concern for L facial cheek cellulitis treated with keflex  500mg  TID x7d. Rash is somewhat improved but he still notes discomfort/soreness that woke him up last night.  Eye doctor saw him on Monday - reassuringly normal evaluation.   No fevers/chills, no pain with eye movements, no facial swelling.  Diabetic on trulicity . Lab Results  Component Value Date   HGBA1C 5.9 (A) 01/28/2023       Relevant past medical, surgical, family and social history reviewed and updated as indicated. Interim medical history since our last visit reviewed. Allergies and medications reviewed and updated. Outpatient Medications Prior to Visit  Medication Sig Dispense Refill   Accu-Chek Softclix Lancets lancets Use to check blood glucose 3 times a day 100 each 0   aspirin  81 MG EC tablet Take 1 tablet (81 mg total) by mouth daily. 30 tablet 12   atorvastatin  (LIPITOR) 40 MG tablet Take 1 tablet (40 mg total) by mouth daily. 90 tablet 3   Blood Glucose Monitoring Suppl (BLOOD GLUCOSE MONITOR SYSTEM) w/Device KIT Use to check blood  sugar 3 (three) times daily. 1 kit 0   cephALEXin  (KEFLEX ) 500 MG capsule Take 1 capsule (500 mg total) by mouth 3 (three) times daily. 21 capsule 0   cyanocobalamin  (VITAMIN B12) 1000 MCG tablet Take 1 tablet (1,000 mcg total) by mouth daily.     Dulaglutide  (TRULICITY ) 0.75 MG/0.5ML SOAJ Inject 0.75 mg into the skin once a week. 2 mL 6   gabapentin  (NEURONTIN ) 300 MG capsule Take 1 capsule (300 mg total) by mouth 2 (two) times daily. 180 capsule 3   glucose blood test strip Use to check blood glucose 3 times a day 100 each 0   ibuprofen (ADVIL,MOTRIN) 200 MG tablet Take 200-600 mg by mouth every 8 (eight) hours as needed for mild pain (severe back pain.).     Lancets (ONETOUCH DELICA PLUS LANCET33G) MISC Use to check blood glucose 3 (three) times daily. 100 each 0   losartan  (COZAAR ) 50 MG tablet Take 1 tablet (50 mg total) by mouth daily. 90 tablet 3   nitroGLYCERIN  (NITROSTAT ) 0.4 MG SL tablet Place 1 tablet (0.4 mg total) under the tongue every 5 (five) minutes as needed for chest pain (do not take more than 3 doses.). 50 tablet 1   diclofenac  sodium (VOLTAREN ) 1 % GEL APPLY 2 G TOPICALLY 3 (THREE) TIMES DAILY AS NEEDED (  ANTI INFLAMMATORY). 100 g 1   No facility-administered medications prior to visit.     Per HPI unless specifically indicated in ROS section below Review of Systems  Objective:  BP (!) 150/74 (BP Location: Right Arm, Cuff Size: Large)   Pulse 75   Temp 98.5 F (36.9 C) (Oral)   Ht 5' 7 (1.702 m)   Wt 229 lb 4 oz (104 kg)   SpO2 98%   BMI 35.91 kg/m   Wt Readings from Last 3 Encounters:  03/06/23 229 lb 4 oz (104 kg)  03/05/23 230 lb (104.3 kg)  02/28/23 230 lb (104.3 kg)      Physical Exam Vitals and nursing note reviewed.  Constitutional:      Appearance: Normal appearance. He is not ill-appearing.  HENT:     Head:      Comments: Faint tender erythema below left eye, improved     Mouth/Throat:     Mouth: Mucous membranes are moist.     Pharynx:  Oropharynx is clear. No oropharyngeal exudate or posterior oropharyngeal erythema.  Eyes:     Extraocular Movements: Extraocular movements intact.     Conjunctiva/sclera: Conjunctivae normal.     Pupils: Pupils are equal, round, and reactive to light.     Comments: EOMI without pain   Cardiovascular:     Rate and Rhythm: Normal rate and regular rhythm.     Pulses: Normal pulses.     Heart sounds: Normal heart sounds. No murmur heard. Pulmonary:     Effort: Pulmonary effort is normal. No respiratory distress.     Breath sounds: Normal breath sounds. No wheezing, rhonchi or rales.  Musculoskeletal:     Cervical back: Normal range of motion and neck supple.     Right lower leg: No edema.     Left lower leg: No edema.  Lymphadenopathy:     Head:     Right side of head: No submental, submandibular, tonsillar, preauricular or posterior auricular adenopathy.     Left side of head: No submental, submandibular, tonsillar, preauricular or posterior auricular adenopathy.     Cervical: No cervical adenopathy.     Right cervical: No superficial, deep or posterior cervical adenopathy.    Left cervical: No superficial, deep or posterior cervical adenopathy.     Upper Body:     Right upper body: No supraclavicular adenopathy.     Left upper body: No supraclavicular adenopathy.  Skin:    General: Skin is warm and dry.     Findings: Erythema present. No rash.  Neurological:     Mental Status: He is alert.  Psychiatric:        Mood and Affect: Mood normal.        Behavior: Behavior normal.       Results for orders placed or performed in visit on 01/28/23  POCT glycosylated hemoglobin (Hb A1C)   Collection Time: 01/28/23  9:55 AM  Result Value Ref Range   Hemoglobin A1C 5.9 (A) 4.0 - 5.6 %   HbA1c POC (<> result, manual entry)     HbA1c, POC (prediabetic range)     HbA1c, POC (controlled diabetic range)      Assessment & Plan:   Problem List Items Addressed This Visit     HTN  (hypertension)   Chronic BP remaining above ideal - will increase losartan  to 100mg  daily.  Reassess at f/u visit next week, likely update BMP at that time.       Relevant Medications  nitroGLYCERIN  (NITROSTAT ) 0.4 MG SL tablet   losartan  (COZAAR ) 100 MG tablet   Memory change   MMSE 24/30 (11/2022)  I did encourage he keep f/u appt for next week for further evaluation, and bring all meds along with his wife.       Cellulitis of external cheek, left - Primary   Overall improved after keflex  course. Encouraged he fully finish this then start warm compresses to area.  No need for ongoing antibiotic. Not consistent with shingles. Did see eye doctor - with reassuring evaluation.  Will reassess at follow up visit next week.        Meds ordered this encounter  Medications   nitroGLYCERIN  (NITROSTAT ) 0.4 MG SL tablet    Sig: Place 1 tablet (0.4 mg total) under the tongue every 5 (five) minutes as needed for chest pain (do not take more than 3 doses.).    Dispense:  50 tablet    Refill:  0   losartan  (COZAAR ) 100 MG tablet    Sig: Take 1 tablet (100 mg total) by mouth daily.    Dispense:  90 tablet    Refill:  3    Note new dose    No orders of the defined types were placed in this encounter.   Patient Instructions  Ensure you finish all Keflex  (cephalexin ) antibiotic prescribed.  I want you to use warm compresses three times a day to left cheek to stimulate circulation. Good to see you today.  BP remaining too high. Increase losartan  to 100mg  daily (you may take 2 tablets of the 50mg  dose until you run out, higher dose will be at pharmacy).  Monitor blood pressure readings at home over the next week and jot down, then bring in log sheet filled out (provided today)  Keep appointment next week on Tuesday - bring all your medicines, and bring your wife.   Follow up plan: Return if symptoms worsen or fail to improve.  Anton Blas, MD

## 2023-03-12 ENCOUNTER — Encounter: Payer: Self-pay | Admitting: Family Medicine

## 2023-03-12 ENCOUNTER — Ambulatory Visit (INDEPENDENT_AMBULATORY_CARE_PROVIDER_SITE_OTHER): Payer: PPO | Admitting: Family Medicine

## 2023-03-12 ENCOUNTER — Other Ambulatory Visit: Payer: Self-pay

## 2023-03-12 VITALS — BP 138/76 | HR 60 | Temp 97.8°F | Ht 67.75 in | Wt 231.5 lb

## 2023-03-12 DIAGNOSIS — E538 Deficiency of other specified B group vitamins: Secondary | ICD-10-CM

## 2023-03-12 DIAGNOSIS — H9193 Unspecified hearing loss, bilateral: Secondary | ICD-10-CM

## 2023-03-12 DIAGNOSIS — G3184 Mild cognitive impairment, so stated: Secondary | ICD-10-CM | POA: Diagnosis not present

## 2023-03-12 DIAGNOSIS — L03211 Cellulitis of face: Secondary | ICD-10-CM

## 2023-03-12 MED ORDER — DONEPEZIL HCL 5 MG PO TABS
5.0000 mg | ORAL_TABLET | Freq: Every day | ORAL | 2 refills | Status: DC
  Filled 2023-03-12: qty 90, 90d supply, fill #0

## 2023-03-12 NOTE — Patient Instructions (Addendum)
See below 4 core lifestyle modifications to support a healthy mind:  1. Nutritious well balance diet.  2. Regular physical activity routine.  3. Regular mental activity such as reading books, word puzzles, math puzzles, jigsaw puzzles.  4. Social engagement.  Also ensure good blood pressure control, limit alcohol, no smoking.    Start aricept 5mg  nightly for memory.  Good to see you today Return in 6 months for physical

## 2023-03-12 NOTE — Progress Notes (Unsigned)
Ph: [phone] Fax: [phone]   Patient ID: Casey Golden, male    DOB: 09/01/1950, 73 y.o.   MRN: [id number]  This visit was conducted in person.  BP 138/76   Pulse 60   Temp 97.8 F (36.6 C) (Oral)   Ht 5' 7.75" (1.721 m)   Wt 231 lb 8 oz (105 kg)   SpO2 98%   BMI 35.46 kg/m    CC: memory eval  Subjective:   HPI: Casey Golden is a 73 y.o. male presenting on 03/12/2023 for Medical Management of Chronic Issues (Here for 2-3 mo memory f/u. Pt accompanied by wife, [name]. )   Cheek cellulitis - see prior note for details. Improving after keflex course. Had reassuring eye exam.  Diabetic on trulicity.   Wife notes he repeats questions a lot.  Not wearing hearing aides.   MMSE 24/30 (11/2022), TSH normal, B12 low normal at 329-started on B12 replacement 1000 mcg daily. Brain MRI from 11/2022 for facial paresthesias showed mild chronic microvascular ischemic changes and prominence of the supratentorial ventricular system suggestive of central parenchymal volume loss.   Geriatric Assessment: Activities of Daily Living:     Bathing- independent     Dressing- independent     Eating- independent     Toileting- independent     Transferring- independent     Continence- independent  Overall Assessment: independent   Instrumental Activities of Daily Living:     Transportation- independent     Meal/Food Preparation- independent     Shopping Errands- independent  - most of the shopping    Housekeeping/Chores- independent     Money Management/Finances- independent     Medication Management-- independent    Ability to Use Telephone- independent    Laundry- partially dependent Overall Assessment:  independent  Mental Status Exam:   Clock Drawing Score:       Relevant past medical, surgical, family and social history reviewed and updated as indicated. Interim medical history since our last visit reviewed. Allergies and medications reviewed and  updated. Outpatient Medications Prior to Visit  Medication Sig Dispense Refill   Accu-Chek Softclix Lancets lancets Use to check blood glucose 3 times a day 100 each 0   aspirin 81 MG EC tablet Take 1 tablet (81 mg total) by mouth daily. 30 tablet 12   atorvastatin (LIPITOR) 40 MG tablet Take 1 tablet (40 mg total) by mouth daily. 90 tablet 3   Blood Glucose Monitoring Suppl (BLOOD GLUCOSE MONITOR SYSTEM) w/Device KIT Use to check blood sugar 3 (three) times daily. 1 kit 0   cyanocobalamin (VITAMIN B12) 1000 MCG tablet Take 1 tablet (1,000 mcg total) by mouth daily.     diclofenac sodium (VOLTAREN) 1 % GEL APPLY 2 G TOPICALLY 3 (THREE) TIMES DAILY AS NEEDED (ANTI INFLAMMATORY). 100 g 1   Dulaglutide (TRULICITY) 0.75 MG/0.5ML SOAJ Inject 0.75 mg into the skin once a week. 2 mL 6   gabapentin (NEURONTIN) 300 MG capsule Take 1 capsule (300 mg total) by mouth 2 (two) times daily. 180 capsule 3   glucose blood test strip Use to check blood glucose 3 times a day 100 each 0   ibuprofen (ADVIL,MOTRIN) 200 MG tablet Take 200-600 mg by mouth every 8 (eight) hours as needed for mild pain (severe back pain.).     Lancets (ONETOUCH DELICA PLUS LANCET33G) MISC Use to check blood glucose 3 (three) times daily. 100 each 0   losartan (COZAAR) 100 MG tablet Take 1 tablet (  100 mg total) by mouth daily. 90 tablet 3   nitroGLYCERIN (NITROSTAT) 0.4 MG SL tablet Place 1 tablet (0.4 mg total) under the tongue every 5 (five) minutes as needed for chest pain (do not take more than 3 doses.). 50 tablet 0   cephALEXin (KEFLEX) 500 MG capsule Take 1 capsule (500 mg total) by mouth 3 (three) times daily. 21 capsule 0   No facility-administered medications prior to visit.     Per HPI unless specifically indicated in ROS section below Review of Systems  Objective:  BP 138/76   Pulse 60   Temp 97.8 F (36.6 C) (Oral)   Ht 5' 7.75" (1.721 m)   Wt 231 lb 8 oz (105 kg)   SpO2 98%   BMI 35.46 kg/m   Wt Readings from  Last 3 Encounters:  03/12/23 231 lb 8 oz (105 kg)  03/06/23 229 lb 4 oz (104 kg)  03/05/23 230 lb (104.3 kg)      Physical Exam Vitals and nursing note reviewed.  Constitutional:      Appearance: Normal appearance. He is not ill-appearing.  Cardiovascular:     Rate and Rhythm: Normal rate and regular rhythm.     Pulses: Normal pulses.     Heart sounds: Normal heart sounds. No murmur heard. Pulmonary:     Effort: Pulmonary effort is normal. No respiratory distress.     Breath sounds: Normal breath sounds. No wheezing, rhonchi or rales.  Musculoskeletal:     Right lower leg: No edema.     Left lower leg: No edema.  Neurological:     Mental Status: He is alert.  Psychiatric:        Mood and Affect: Mood normal.        Behavior: Behavior normal.       Results for orders placed or performed in visit on 01/28/23  POCT glycosylated hemoglobin (Hb A1C)   Collection Time: 01/28/23  9:55 AM  Result Value Ref Range   Hemoglobin A1C 5.9 (A) 4.0 - 5.6 %   HbA1c POC (<> result, manual entry)     HbA1c, POC (prediabetic range)     HbA1c, POC (controlled diabetic range)      Assessment & Plan:   Problem List Items Addressed This Visit   None    No orders of the defined types were placed in this encounter.   No orders of the defined types were placed in this encounter.   There are no Patient Instructions on file for this visit.  Follow up plan: No follow-ups on file.  Eustaquio Boyden, MD

## 2023-03-13 ENCOUNTER — Other Ambulatory Visit: Payer: Self-pay

## 2023-03-13 DIAGNOSIS — G3184 Mild cognitive impairment, so stated: Secondary | ICD-10-CM | POA: Insufficient documentation

## 2023-03-13 DIAGNOSIS — E538 Deficiency of other specified B group vitamins: Secondary | ICD-10-CM | POA: Insufficient documentation

## 2023-03-13 NOTE — Assessment & Plan Note (Deleted)
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  Reviewed 4 core strategies for a healthy mind as per instructions. Discussed further evaluation options including brain imaging vs neurology eval Discussed treatment options - will start aricept 5mg  nightly, monitoring for cardiac conduction abnormalities and GI upset side effects.  Reassess at CPE in 6 months.

## 2023-03-13 NOTE — Assessment & Plan Note (Addendum)
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  Reviewed recent brain MRI last yaer showing chronic microvascular ischemic changes with supratentorial ventricular prominence suggesting central parenchymal volume loss. Reviewed 4 core strategies for a healthy mind as per instructions. Discussed further evaluation options including neurology eval - will defer at this time. Discussed treatment options - will start aricept 5mg  nightly, monitoring for cardiac conduction abnormalities and GI upset side effects.  Prior labwork unrevealing Reassess at CPE in 6 months.

## 2023-03-13 NOTE — Assessment & Plan Note (Signed)
Continue b12 daily.

## 2023-03-13 NOTE — Assessment & Plan Note (Signed)
Overall improved, completed Keflex course.  Has not yet started warm compresses -encouraged he do this.

## 2023-03-13 NOTE — Assessment & Plan Note (Addendum)
Encouraged regular hearing aide use, reviewed relation of decreased hearing on cognitive impairment.

## 2023-04-02 ENCOUNTER — Other Ambulatory Visit: Payer: Self-pay

## 2023-04-02 MED FILL — Dulaglutide Soln Auto-injector 0.75 MG/0.5ML: SUBCUTANEOUS | 28 days supply | Qty: 2 | Fill #4 | Status: AC

## 2023-04-08 ENCOUNTER — Telehealth: Payer: Self-pay | Admitting: Family Medicine

## 2023-04-08 DIAGNOSIS — G3184 Mild cognitive impairment, so stated: Secondary | ICD-10-CM

## 2023-04-08 MED ORDER — VITAMIN E 1000 UNITS PO CAPS: 1000.0000 [IU] | ORAL_CAPSULE | Freq: Two times a day (BID) | ORAL | Status: AC

## 2023-04-08 NOTE — Telephone Encounter (Signed)
 Neuro referral placed. Thank you.

## 2023-04-08 NOTE — Addendum Note (Signed)
 Addended by: Eustaquio Boyden on: 04/08/2023 04:26 PM   Modules accepted: Orders

## 2023-04-08 NOTE — Telephone Encounter (Signed)
 Noted. See prior phone note regarding same issue.

## 2023-04-08 NOTE — Addendum Note (Signed)
 Addended by: Donnamarie Poag on: 04/08/2023 02:11 PM   Modules accepted: Orders

## 2023-04-08 NOTE — Telephone Encounter (Signed)
 Spoke to patient he will D/C the aricept. It has been removed from list. He will start vit E as directed. He would like referral to Neurology. Does not have preference of [place] or [place]. Will give our office a call if has not received call to schedule appointment in two weeks.

## 2023-04-08 NOTE — Telephone Encounter (Signed)
 Copied from CRM [id number]. Topic: Clinical - Medication Question >> Apr 08, 2023  8:42 AM Alcus Dad wrote: Reason for CRM: Patient stated that he has been taking donepezil (ARICEPT) 5 MG tablet and since he started taking the medication he has not been feeling too good. Make patient head feels like it is swelling and also makes his head hurt

## 2023-04-08 NOTE — Telephone Encounter (Signed)
 Noted. Recommend he stop aricept as headaches can be side effect.   Would suggest starting vitamin E 1000 units twice daily for possible neuroprotective effect.  Would offer neurology referral if desires further evaluation or possible treatment.

## 2023-04-08 NOTE — Telephone Encounter (Signed)
 Copied from CRM [id number]. Topic: Referral - Status >> Apr 08, 2023  2:02 PM Sim Boast F wrote: Reason for CRM:  Patient called to follow up on message he left this morning. I did read Dr. Patrice Paradise note and patient did agree that he would like to move forward with Neurology referral

## 2023-04-08 NOTE — Addendum Note (Signed)
 Addended by: Eustaquio Boyden on: 04/08/2023 01:01 PM   Modules accepted: Orders

## 2023-04-10 ENCOUNTER — Other Ambulatory Visit: Payer: Self-pay

## 2023-04-10 ENCOUNTER — Telehealth: Payer: Self-pay

## 2023-04-10 ENCOUNTER — Emergency Department

## 2023-04-10 ENCOUNTER — Encounter: Payer: Self-pay | Admitting: Emergency Medicine

## 2023-04-10 ENCOUNTER — Emergency Department
Admission: EM | Admit: 2023-04-10 | Discharge: 2023-04-11 | Disposition: A | Discharge status: Home / Self Care | Attending: Emergency Medicine | Admitting: Emergency Medicine

## 2023-04-10 DIAGNOSIS — I2510 Atherosclerotic heart disease of native coronary artery without angina pectoris: Secondary | ICD-10-CM | POA: Insufficient documentation

## 2023-04-10 DIAGNOSIS — R0789 Other chest pain: Secondary | ICD-10-CM | POA: Diagnosis not present

## 2023-04-10 DIAGNOSIS — E119 Type 2 diabetes mellitus without complications: Secondary | ICD-10-CM | POA: Insufficient documentation

## 2023-04-10 DIAGNOSIS — R079 Chest pain, unspecified: Secondary | ICD-10-CM | POA: Diagnosis not present

## 2023-04-10 DIAGNOSIS — I10 Essential (primary) hypertension: Secondary | ICD-10-CM | POA: Diagnosis not present

## 2023-04-10 LAB — BASIC METABOLIC PANEL
Anion gap: 9 (ref 5–15)
BUN: 17 mg/dL (ref 8–23)
CO2: 27 mmol/L (ref 22–32)
Calcium: 9.1 mg/dL (ref 8.9–10.3)
Chloride: 101 mmol/L (ref 98–111)
Creatinine, Ser: 0.87 mg/dL (ref 0.61–1.24)
GFR, Estimated: >60 mL/min (ref >60)
Glucose, Bld: 151 mg/dL — ABNORMAL HIGH (ref 70–99)
Potassium: 4.4 mmol/L (ref 3.5–5.1)
Sodium: 137 mmol/L (ref 135–145)

## 2023-04-10 LAB — CBC
HCT: 47.2 % (ref 39.0–52.0)
Hemoglobin: 17 g/dL (ref 13.0–17.0)
MCH: 35.4 pg — ABNORMAL HIGH (ref 26.0–34.0)
MCHC: 36 g/dL (ref 30.0–36.0)
MCV: 98.3 fL (ref 80.0–100.0)
Platelets: 240 10*3/uL (ref 150–400)
RBC: 4.8 MIL/uL (ref 4.22–5.81)
RDW: 11.9 % (ref 11.5–15.5)
WBC: 8.8 10*3/uL (ref 4.0–10.5)
nRBC: 0 % (ref 0.0–0.2)

## 2023-04-10 LAB — TROPONIN I (HIGH SENSITIVITY)
Troponin I (High Sensitivity): 10 ng/L (ref <18)
Troponin I (High Sensitivity): 9 ng/L (ref <18)

## 2023-04-10 NOTE — ED Provider Triage Note (Signed)
 Emergency Medicine Provider Triage Evaluation Note  Casey Golden , a 73 y.o. male  was evaluated in triage.  Pt complains of chest pain since yesterday, weakness patient was referred from [place] [facility].  Review of Systems  Positive:  Negative:   Physical Exam  BP (!) 141/71 (BP Location: Left Arm)   Pulse 76   Temp 98.5 F (36.9 C) (Oral)   Resp 18   Ht 5\' 8"  (1.727 m)   Wt 105 kg   SpO2 96%   BMI 35.20 kg/m  Gen:   Awake, no distress   Resp:  Normal effort no wheezing MSK:   Moves extremities without difficulty  Other:    Medical Decision Making  Medically screening exam initiated at 5:29 PM.  Appropriate orders placed.  Nakia Koble was informed that the remainder of the evaluation will be completed by another provider, this initial triage assessment does not replace that evaluation, and the importance of remaining in the ED until their evaluation is complete.  With chest pain order [facility] chest x-ray troponins   Gladys Damme, PA-C 04/10/23 1730

## 2023-04-10 NOTE — Telephone Encounter (Signed)
 Noted.

## 2023-04-10 NOTE — Telephone Encounter (Addendum)
 Unfortunately I don't know of any other office that would have sooner appt unless he would be willing to try [facility] in [place].

## 2023-04-10 NOTE — Telephone Encounter (Signed)
 Copied from CRM [id number]. Topic: General - Other >> Apr 10, 2023 11:48 AM Aletta Edouard wrote: Reason for CRM: patient appt isnt until June the 26th with the neurologist  patient wife would like a call back to see if there is someone he can see sooner

## 2023-04-10 NOTE — ED Triage Notes (Signed)
 Patient to ED via POV from [facility] for centralized chest pain. Started yesterday after working outside. Hx of MI in 1999.

## 2023-04-10 NOTE — Telephone Encounter (Signed)
 Misty Stanley CMA said she was talking with pt and pt said that he was having CP and asked me to triage pt; I spoke  with pt and starting today pt has had mid dull chest pain that radiates to his arm. Pt does not have SOB, sweats or nausea and pt had heart attack 8 yrs ago and this is not the same pain. I explained pts symptoms can be caused by different things but it is also symptoms of possible heart attack. Pt said he will go to [facility] in now. Sending note to DR G and G pool.I spoke with Misty Stanley CMA also.

## 2023-04-10 NOTE — Telephone Encounter (Signed)
 Spoke with pt relaying Dr Timoteo Expose message. Pt verbalizes understanding and states he'll keep appt already scheduled.

## 2023-04-11 NOTE — ED Provider Notes (Signed)
 [facility] Provider Note    Event Date/Time   First MD Initiated Contact with Patient 04/11/23 [phone]     (approximate)  History   Chief Complaint: Chest Pain  HPI  Casey Golden is a 73 y.o. male with a past medical history of CAD status post prior MI 25 years ago, gastric reflux, hypertension, hyperlipidemia, diabetes, presents to the emergency department for chest pain.  According to the patient for the last 2 days the patient has been experiencing some chest discomfort.  He states it started after he was working in the yard.  He states he thought it would go away which he did but it came back somewhat today which concerned him so he came to the emergency department.  Unfortunate patient has had a prolonged wait in the emergency department approximately 7 hours.  He states all of his pain has completely resolved he has no chest pain.  Denies any shortness of breath nausea or diaphoresis at any point.  No fever cough or congestion.  Physical Exam   Triage Vital Signs: ED Triage Vitals [04/10/23 1728]  Encounter Vitals Group     BP (!) 141/71     Systolic BP Percentile      Diastolic BP Percentile      Pulse Rate 76     Resp 18     Temp 98.5 F (36.9 C)     Temp Source Oral     SpO2 96 %     Weight 231 lb 7.7 oz (105 kg)     Height 5\' 8"  (1.727 m)     Head Circumference      Peak Flow      Pain Score 2     Pain Loc      Pain Education      Exclude from Growth Chart     Most recent vital signs: Vitals:   04/10/23 2121 04/11/23 0011  BP: (!) 155/79 (!) 145/67  Pulse: 72 63  Resp: 18 17  Temp: 98.9 F (37.2 C) 98.8 F (37.1 C)  SpO2: 96% 99%    General: Awake, no distress.  CV:  Good peripheral perfusion.  Regular rate and rhythm  Resp:  Normal effort.  Equal breath sounds bilaterally.  Abd:  No distention.  Soft, nontender.  No rebound or guarding.  ED Results / Procedures / Treatments   EKG  EKG viewed and interpreted by myself  shows normal sinus rhythm at 72 bpm with a narrow QRS, normal axis, normal intervals, no concerning ST changes.  Reassuring EKG.  RADIOLOGY  Chest x-ray read as negative.  (Unable to view images locally).   MEDICATIONS ORDERED IN ED: Medications - No data to display   IMPRESSION / MDM / ASSESSMENT AND PLAN / ED COURSE  I reviewed the triage vital signs and the nursing notes.  Patient's presentation is most consistent with acute presentation with potential threat to life or bodily function.  Patient presents to the emergency department for chest discomfort for the last 2 days since working in the yard.  Patient thinks he might have just "overdone it" while working in the yard but given his history of MI 25 years ago he was concerned so he came to the emergency department for evaluation.  Patient's workup tonight is reassuring showing a normal CBC, reassuring chemistry, troponin negative x 2.  Patient's chest x-ray is clear.  EKG shows no concerning findings.  Given the patient's reassuring workup, reassuring physical exam  and reassuring vital signs as well as being chest pain-free I believe the patient is safe for discharge home with outpatient follow-up.  Patient follows up with Dr. Mariah Milling of cardiology.  He will call them tomorrow to inform them of today's ER visit.  I discussed return precautions for any return of chest pain any shortness of breath or any other symptom concerning to the patient.  He is agreeable to this plan of care.  FINAL CLINICAL IMPRESSION(S) / ED DIAGNOSES   Chest pain   Note:  This document was prepared using Dragon voice recognition software and may include unintentional dictation errors.   Minna Antis, MD 04/11/23 [phone]

## 2023-04-12 NOTE — Telephone Encounter (Signed)
 Noted ER evaluation with reassuring labs, EKG ,chest xray.

## 2023-04-14 NOTE — Progress Notes (Unsigned)
 Cardiology Office Note    Date:  04/16/2023   ID:  Casey Golden, DOB 09/04/1950, MRN [id number]  PCP:  Eustaquio Boyden, MD  Cardiologist:  Julien Nordmann, MD  Electrophysiologist:  None   Chief Complaint: ED follow-up  History of Present Illness:   Casey Golden is a 73 y.o. male with history of former tobacco use (27 years, quit 1990), CAD, mixed HLD, hypertension, syncope, OSA, osteoarthritis, chronic low back pain, T2DM, and extramammary Paget's disease of the scrotum who presents for ED follow up.   Longstanding history of nonobstructive CAD with positive nuclear stress test leading to cath 04/2006 showing normal LVEF, 50% mid RCA disease, and 30% LAD disease. Cath 11/2007 with 40% mid RCA disease, normal LVEF. Multiple ED visits in late 2022/early 2023 for recurrent chest pain. Repeat cath 01/2021 showed mild to moderate, nonobstructive CAD, including long segment of mid LAD stenosis of up to 30 to 40%, 20% ostial D2 stenosis, and 20 to 30% proximal RCA lesion. Hyperdynamic LVSF with mildly elevated filling pressure ~20 mmHg. Patient was started on Imdur for antianginal effect however could not tolerate due to headache and was later discontinued. Unable to add BB due to hypotension.  Most recently seen in clinic 03/2022 was doing well from a cardiac perspective without symptoms of angina. No medication changes were made. Seen in the emergency department 04/10/2023 with 2 days of chest discomfort provoked by working in his yard. On presentation, blood pressure was mildly elevated at 145/67, otherwise vitals within normal limits. EKG without ischemic changes. CBC and BMP within normal limits. Troponin negative x 2. Chest x-ray clear. By the time patient was seen by a provider, his chest pain had resolved. Given reassuring workup he was later discharged.   Since his ED visit, he reports ongoing intermittent episodes of centrally located chest pressure. Never worse than 3/10 pain and he has  not felt the pain is bad enough to take sublingual nitroglycerin. This occurs at inconsistent times and lasts several minutes to an hour. Sometimes wakes him up from sleep. Not associated with exertion. Unchanged by position or deep breaths. Not reproducible with palpation. He tells me this pain is different from prior anginal episodes, which started in his jaw and radiated down his left arm. He denies shortness of breath, palpitations, diaphoresis, nausea, cough, fever, chills, lower extremity edema, orthopnea, and PND. Currently without symptoms of angina. He reports compliance with all medications without adverse effect. He no longer walks for exercise due to back pain, but stays active with yard work, push mowing his lawn, and taking care of his grandchildren.   Labs independently reviewed: 03/2023 - Hgb 17.0, PLT 240, potassium 4.4, BUN 17, serum creatinine 0.87 12/2022 - A1c 5.9 11/2022 - TSH normal, albumin 4.3, AST/ALT normal 07/2022 - TC 94, TG 137, HDL 29, LDL 38  Past Medical History:  Diagnosis Date   Arthritis    CAD (coronary artery disease)    cathx3 with nonobstructive disease.   Cancer [facility])    Cataract 2019   bilateral; resolved with surgery   Colonic polyp    Fatty liver disease, nonalcoholic 2015   by [place]   GERD (gastroesophageal reflux disease)    History of chicken pox    Hyperlipidemia    Hypertension    Nocturia    OSA (obstructive sleep apnea)    CPAP, compliant   Paget's disease of bony pelvis    Past use of tobacco    Quit 1990, 70  pack year history   Rash of genital area    09-08-2014  per pt Dr Mena Goes aware   Seasonal and perennial allergic rhinitis    Stroke [facility])    Type 2 diabetes mellitus (HCC)    Wears dentures    full upper/  partial lower   Wears glasses     Past Surgical History:  Procedure Laterality Date   ABDOMINAL HERNIA REPAIR  2007      [facility]   open repair   APPENDECTOMY  age 18   CARDIAC CATHETERIZATION  12-23-2007   [facility]    Abnormal myoview w/ ischemia/  40% mRCA with nonobstructive and no sig. plaque in his left system, EF 55%   CARDIAC CATHETERIZATION  Apr 2008    [facility]   Abnormal myoview/  50% RCA,  ef 65%   CARDIAC CATHETERIZATION  1999      [facility]   CATARACT EXTRACTION, BILATERAL Bilateral 11/2017   CERVICAL FUSION  1992   CHOLECYSTECTOMY OPEN  2006   COLONOSCOPY WITH PROPOFOL N/A 04/17/2016   TAs, high grade dysplasia with margins clear, diverticulosis Midge Minium, MD)   COLONOSCOPY WITH PROPOFOL N/A 08/07/2016   TAx1, diverticulosis, rpt 3 yrs ([facility])   COLONOSCOPY WITH PROPOFOL N/A 01/05/2020   SSP, rpt Servando Snare, Darren, MD)   DENTAL SURGERY     metal dental implant L mandible   EXCISION OF SKIN TAG Right 09/14/2014   Procedure: EXCISION OF SKIN TAG;  Surgeon: Jerilee Field, MD;  Location: [facility];  Service: Urology;  Laterality: Right;   GROIN MASS OPEN BIOPSY Left 01/17/2019   HYDROCELE EXCISION Left 09/14/2014   Procedure: LEFT HYDROCELECTOMY ADULT;  Surgeon: Jerilee Field, MD;  [facility]: [facility];  Service: Urology;  Laterality: Left;   LEFT HEART CATH AND CORONARY ANGIOGRAPHY Left 02/15/2021   Procedure: LEFT HEART CATH AND CORONARY ANGIOGRAPHY;  Surgeon: Yvonne Kendall, MD;  Location: [facility] INVASIVE CV LAB;  Service: Cardiovascular;  Laterality: Left;   MOHS SURGERY  2015   skin cancer   TONSILLECTOMY  age 50    Current Medications: Current Meds  Medication Sig   Accu-Chek Softclix Lancets lancets Use to check blood glucose 3 times a day   aspirin 81 MG EC tablet Take 1 tablet (81 mg total) by mouth daily.   atorvastatin (LIPITOR) 40 MG tablet Take 1 tablet (40 mg total) by mouth daily.   Blood Glucose Monitoring Suppl (BLOOD GLUCOSE MONITOR SYSTEM) w/Device KIT Use to check blood sugar 3 (three) times daily.   cyanocobalamin (VITAMIN B12) 1000 MCG tablet Take 1 tablet (1,000 mcg total) by mouth daily.   diclofenac sodium (VOLTAREN) 1 % GEL APPLY 2 G  TOPICALLY 3 (THREE) TIMES DAILY AS NEEDED (ANTI INFLAMMATORY).   Dulaglutide (TRULICITY) 0.75 MG/0.5ML SOAJ Inject 0.75 mg into the skin once a week.   gabapentin (NEURONTIN) 300 MG capsule Take 1 capsule (300 mg total) by mouth 2 (two) times daily.   glucose blood test strip Use to check blood glucose 3 times a day   ibuprofen (ADVIL,MOTRIN) 200 MG tablet Take 200-600 mg by mouth every 8 (eight) hours as needed for mild pain (severe back pain.).   Lancets (ONETOUCH DELICA PLUS LANCET33G) MISC Use to check blood glucose 3 (three) times daily.   losartan (COZAAR) 100 MG tablet Take 1 tablet (100 mg total) by mouth daily.   metoprolol succinate (TOPROL XL) 25 MG 24 hr tablet Take 0.5 tablets (12.5 mg total) by mouth daily.  metoprolol tartrate (LOPRESSOR) 50 MG tablet Take 1 tablet (50 mg total) by mouth once for 1 dose 2 hours prior to cardiac procedure   nitroGLYCERIN (NITROSTAT) 0.4 MG SL tablet Place 1 tablet (0.4 mg total) under the tongue every 5 (five) minutes as needed for chest pain (do not take more than 3 doses.).   vitamin E 1000 UNIT capsule Take 1 capsule (1,000 Units total) by mouth in the morning and at bedtime.    Allergies:   Aricept [donepezil], Elemental sulfur, Imdur [isosorbide nitrate], and Sulfa antibiotics   Social History   Socioeconomic History   Marital status: Married    Spouse name: Not on file   Number of children: 3   Years of education: Not on file   Highest education level: 12th grade  Occupational History   Occupation: Full time    Employer: [employer]  Tobacco Use   Smoking status: Former    Current packs/day: 0.00    Average packs/day: 2.0 packs/day for 35.0 years (70.0 ttl pk-yrs)    Types: Cigarettes    Start date: 01/30/1955    Quit date: 01/29/1990    Years since quitting: 33.2   Smokeless tobacco: Never  Vaping Use   Vaping status: Never Used  Substance and Sexual Activity   Alcohol use: No   Drug use: No   Sexual activity: Yes   Other Topics Concern   Not on file  Social History Narrative   Lives with wife, dog and cats    Occupation: retired, was self employed, now works at [employer]    Edu: HS   Activity: walks 1.5 mi daily   Diet: some water, fruits/vegetables daily   Social Drivers of Corporate investment banker Strain: Low Risk  (12/23/2022)   Overall Financial Resource Strain (CARDIA)    Difficulty of Paying Living Expenses: Not hard at all  Food Insecurity: No Food Insecurity (12/23/2022)   Hunger Vital Sign    Worried About Running Out of Food in the Last Year: Never true    Ran Out of Food in the Last Year: Never true  Transportation Needs: No Transportation Needs (12/23/2022)   PRAPARE - Administrator, Civil Service (Medical): No    Lack of Transportation (Non-Medical): No  Physical Activity: Insufficiently Active (12/23/2022)   Exercise Vital Sign    Days of Exercise per Week: 2 days    Minutes of Exercise per Session: 30 min  Stress: No Stress Concern Present (12/23/2022)   [employer] of Occupational Health - Occupational Stress Questionnaire    Feeling of Stress : Not at all  Social Connections: Unknown (12/23/2022)   Social Connection and Isolation Panel [NHANES]    Frequency of Communication with Friends and Family: More than three times a week    Frequency of Social Gatherings with Friends and Family: Twice a week    Attends Religious Services: Patient declined    Database administrator or Organizations: No    Attends Engineer, structural: Never    Marital Status: Married     Family History:  The patient's family history includes CAD in his mother; Cancer in his sister; Cancer (age of onset: 20) in his father; Diabetes in his mother and paternal grandfather; Heart disease in his paternal grandfather and paternal grandmother; Stroke in his mother. There is no history of Coronary artery disease or Sleep apnea.  ROS:   12-point review of systems is  negative unless otherwise noted in  the HPI.   EKGs/Labs/Other Studies Reviewed:    Studies reviewed were summarized above. The additional studies were reviewed today:  LHC 02/15/2021:   2nd Diag lesion is 20% stenosed.   Mid LAD to Dist LAD lesion is 35% stenosed.   Prox RCA lesion is 25% stenosed.   There is hyperdynamic left ventricular systolic function.   LV end diastolic pressure is mildly elevated.   The left ventricular ejection fraction is greater than 65% by visual estimate.   There is no aortic valve stenosis. Conclusions: Mild-moderate, non-obstructive coronary artery disease, including long segment of mid LAD stenosis of up to 30-40%, 20% ostial D2 stenosis, and 20-30% proximal RCA lesion. Hyperdynamic left ventricular systolic function with mildly elevated filling pressure (LVEDP ~20 mmHg). Recommendations: No significant coronary lesion to explain accelerating angina.  Will add isosorbide mononitrate 15 mg daily. Medical therapy and risk factor modification to prevent progression of disease.  EKG: ED EKG reviewed and shows normal sinus rhythm with first degree AV block, rate 72 bpm  Recent Labs: 12/20/2022: ALT 35 12/24/2022: TSH 0.91 04/10/2023: BUN 17; Creatinine, Ser 0.87; Hemoglobin 17.0; Platelets 240; Potassium 4.4; Sodium 137  Recent Lipid Panel    Component Value Date/Time   CHOL 94 08/13/2022 0512   CHOL 179 07/21/2014 0935   TRIG 137 08/13/2022 0512   TRIG 192 10/03/2007 0000   HDL 29 (L) 08/13/2022 0512   HDL 31 (L) 07/21/2014 0935   CHOLHDL 3.2 08/13/2022 0512   VLDL 27 08/13/2022 0512   LDLCALC 38 08/13/2022 0512   LDLCALC 74 07/21/2014 0935   LDLDIRECT 54.0 02/13/2021 0852    PHYSICAL EXAM:    VS:  BP 132/62 (BP Location: Left Arm)   Pulse 67   Ht 5\' 8"  (1.727 m)   Wt 232 lb (105.2 kg)   SpO2 96%   BMI 35.28 kg/m   BMI: Body mass index is 35.28 kg/m.  Physical Exam Constitutional:      Appearance: Normal appearance.  Cardiovascular:      Rate and Rhythm: Normal rate and regular rhythm.     Pulses: Normal pulses.     Heart sounds: Normal heart sounds. No murmur heard. Pulmonary:     Effort: Pulmonary effort is normal.     Breath sounds: Normal breath sounds.  Chest:     Chest wall: No tenderness.  Musculoskeletal:     Right lower leg: No edema.     Left lower leg: No edema.  Skin:    General: Skin is warm and dry.  Neurological:     Mental Status: He is alert and oriented to person, place, and time.  Psychiatric:        Mood and Affect: Mood normal.        Behavior: Behavior normal.     Wt Readings from Last 3 Encounters:  04/16/23 232 lb (105.2 kg)  04/10/23 231 lb 7.7 oz (105 kg)  03/12/23 231 lb 8 oz (105 kg)     ASSESSMENT & PLAN:   Non-obstructive CAD Chest pain - Longstanding history of CAD detailed above, with most recent cath 01/2021 showing mild to moderate non-obstructive CAD. Seen in the ED 04/11/2023 for chest pain with negative troponin x2, EKG without ischemic changes, and remaining workup largely within normal limits. Today he describes ongoing intermittent episodes of 3/10 chest pressure. Currently without symptoms of angina. Unclear if chest discomfort is ischemic in nature. Continue ASA and atorvastatin. Continue sublingual nitroglycerin as needed. Schedule coronary CT for further  ischemic evaluation. Start metoprolol succinate 12.5 mg daily for antianginal effect.   Essential hypertension - BP slightly above goal at 132/62. Continue losartan 100 mg daily. Addition of metoprolol succinate as above.  First degree AV block - Noted on most recent EKG with PR 212 ms, improved from prior. Will closely monitor with the addition of metoprolol.   Hyperlipidemia - Most recent lipid panel 07/2022 with LDL 38, at goal. Continue atorvastatin 40 mg daily.   Controlled T2DM - A1C 5.9, well controlled  Disposition: F/u with Dr. Mariah Milling or an APP in 1 month.   Medication Adjustments/Labs and Tests  Ordered: Current medicines are reviewed at length with the patient today.  Concerns regarding medicines are outlined above. Medication changes, Labs and Tests ordered today are summarized above and listed in the Patient Instructions accessible in Encounters.   Velora Mediate, PA-C 04/16/2023 4:47 PM     [facility] [place] [address] [place], [place] [address] [phone]

## 2023-04-16 ENCOUNTER — Other Ambulatory Visit: Payer: Self-pay

## 2023-04-16 ENCOUNTER — Encounter: Payer: Self-pay | Admitting: Physician Assistant

## 2023-04-16 ENCOUNTER — Ambulatory Visit: Attending: Physician Assistant | Admitting: Physician Assistant

## 2023-04-16 VITALS — BP 132/62 | HR 67 | Ht 68.0 in | Wt 232.0 lb

## 2023-04-16 DIAGNOSIS — R072 Precordial pain: Secondary | ICD-10-CM | POA: Diagnosis not present

## 2023-04-16 DIAGNOSIS — Z79899 Other long term (current) drug therapy: Secondary | ICD-10-CM

## 2023-04-16 DIAGNOSIS — I25118 Atherosclerotic heart disease of native coronary artery with other forms of angina pectoris: Secondary | ICD-10-CM

## 2023-04-16 DIAGNOSIS — I10 Essential (primary) hypertension: Secondary | ICD-10-CM | POA: Diagnosis not present

## 2023-04-16 DIAGNOSIS — I440 Atrioventricular block, first degree: Secondary | ICD-10-CM | POA: Diagnosis not present

## 2023-04-16 MED ORDER — METOPROLOL SUCCINATE ER 25 MG PO TB24
12.5000 mg | ORAL_TABLET | Freq: Every day | ORAL | 3 refills | Status: DC
  Filled 2023-04-16: qty 50, 100d supply, fill #0
  Filled 2023-04-16: qty 45, 90d supply, fill #0

## 2023-04-16 MED ORDER — METOPROLOL TARTRATE 50 MG PO TABS
50.0000 mg | ORAL_TABLET | Freq: Once | ORAL | 0 refills | Status: DC
  Filled 2023-04-16 (×2): qty 1, 1d supply, fill #0

## 2023-04-16 NOTE — Patient Instructions (Addendum)
 Medication Instructions:  Your physician recommends the following medication changes.    START TAKING: Toprol 12.5 mg daily  *If you need a refill on your cardiac medications before your next appointment, please call your pharmacy*   Lab Work: No labs ordered today  If you have labs (blood work) drawn today and your tests are completely normal, you will receive your results only by: MyChart Message (if you have MyChart) OR A paper copy in the mail If you have any lab test that is abnormal or we need to change your treatment, we will call you to review the results.   Testing/Procedures:   Your cardiac CT will be scheduled at one of the below locations:   [facility] [address] [place], [place] [address] [phone]   If scheduled at [facility] or [facility], please arrive 15 mins early for check-in and test prep.  There is spacious parking and easy access to the radiology department from the [facility] entrance. Please enter here and check-in with the desk attendant.   If scheduled at [facility], please arrive 30 minutes early for check-in and test prep.  Please follow these instructions carefully (unless otherwise directed):  An IV will be required for this test and Nitroglycerin will be given.  Hold all erectile dysfunction medications at least 3 days (72 hrs) prior to test. (Ie viagra, cialis, sildenafil, tadalafil, etc)   On the Night Before the Test: Be sure to Drink plenty of water. Do not consume any caffeinated/decaffeinated beverages or chocolate 12 hours prior to your test. Do not take any antihistamines 12 hours prior to your test.  On the Day of the Test: Drink plenty of water until 1 hour prior to the test. Do not eat any food 1 hour prior to test. You may take your regular medications prior to the test.  Take metoprolol  Tartrate  (Lopressor) 50 mg two  hours prior to test. Hold metoprolo Succinate ( TOPROL- XL ) 12.5mg  the morning of. If you take Furosemide/Hydrochlorothiazide/Spironolactone/Chlorthalidone, please HOLD on the morning of the test. Patients who wear a continuous glucose monitor MUST remove the device prior to scanning.   After the Test: Drink plenty of water. After receiving IV contrast, you may experience a mild flushed feeling. This is normal. On occasion, you may experience a mild rash up to 24 hours after the test. This is not dangerous. If this occurs, you can take Benadryl 25 mg, Zyrtec, Claritin, or Allegra and increase your fluid intake. (Patients taking Tikosyn should avoid Benadryl, and may take Zyrtec, Claritin, or Allegra) If you experience trouble breathing, this can be serious. If it is severe call 911 IMMEDIATELY. If it is mild, please call our office.  We will call to schedule your test 2-4 weeks out understanding that some insurance companies will need an authorization prior to the service being performed.   For more information and frequently asked questions, please visit our website : [email or web address]  For non-scheduling related questions, please contact the cardiac imaging nurse navigator should you have any questions/concerns: Cardiac Imaging Nurse Navigators Direct Office Dial: [phone]   For scheduling needs, including cancellations and rescheduling, please call [place], [phone].    Follow-Up: At [facility], you and your health needs are our priority.  As part of our continuing mission to provide you with exceptional heart care, we have created designated Provider Care Teams.  These Care Teams include your primary  Cardiologist (physician) and Advanced Practice Providers (APPs -  Physician Assistants and Nurse Practitioners) who all work together to provide you with the care you need, when you need it.    Your next appointment:   1 month(s)  Provider:   You  may see Julien Nordmann, MD or one of the following Advanced Practice Providers on your designated Care Team:   Nicolasa Ducking, NP Eula Listen, PA-C Cadence Fransico Michael, PA-C Charlsie Quest, NP Carlos Levering, NP

## 2023-04-17 LAB — BASIC METABOLIC PANEL
BUN/Creatinine Ratio: 20 (ref 10–24)
BUN: 17 mg/dL (ref 8–27)
CO2: 24 mmol/L (ref 20–29)
Calcium: 9.2 mg/dL (ref 8.6–10.2)
Chloride: 103 mmol/L (ref 96–106)
Creatinine, Ser: 0.86 mg/dL (ref 0.76–1.27)
Glucose: 83 mg/dL (ref 70–99)
Potassium: 4.5 mmol/L (ref 3.5–5.2)
Sodium: 142 mmol/L (ref 134–144)
eGFR: 91 mL/min/{1.73_m2} (ref >59)

## 2023-04-19 ENCOUNTER — Ambulatory Visit: Payer: PPO

## 2023-04-19 DIAGNOSIS — E1169 Type 2 diabetes mellitus with other specified complication: Secondary | ICD-10-CM

## 2023-04-19 DIAGNOSIS — M2041 Other hammer toe(s) (acquired), right foot: Secondary | ICD-10-CM

## 2023-04-19 DIAGNOSIS — M2141 Flat foot [pes planus] (acquired), right foot: Secondary | ICD-10-CM

## 2023-04-19 NOTE — Progress Notes (Signed)
 Patient presents to the office today for diabetic shoe and insole measuring.  Patient was measured with brannock device to determine size and width for 1 pair of extra depth shoes and foam casted for 3 pair of insoles.   Documentation of medical necessity will be sent to patient's treating diabetic doctor to verify and sign.   Patient's diabetic provider: Dion Saucier MD   Shoes and insoles will be ordered at that time and patient will be notified for an appointment for fitting when they arrive.   Shoe size (per patient): 11.5 Shoe choice:   Y900M / 410 Shoe size ordered: 11.5XWD ABN signed

## 2023-04-25 DIAGNOSIS — G8929 Other chronic pain: Secondary | ICD-10-CM | POA: Diagnosis not present

## 2023-04-25 DIAGNOSIS — E785 Hyperlipidemia, unspecified: Secondary | ICD-10-CM | POA: Diagnosis not present

## 2023-04-25 DIAGNOSIS — I10 Essential (primary) hypertension: Secondary | ICD-10-CM | POA: Diagnosis not present

## 2023-04-25 DIAGNOSIS — Z7982 Long term (current) use of aspirin: Secondary | ICD-10-CM | POA: Diagnosis not present

## 2023-04-25 DIAGNOSIS — E669 Obesity, unspecified: Secondary | ICD-10-CM | POA: Diagnosis not present

## 2023-04-26 ENCOUNTER — Other Ambulatory Visit: Payer: Self-pay

## 2023-04-26 MED FILL — Dulaglutide Soln Auto-injector 0.75 MG/0.5ML: SUBCUTANEOUS | 28 days supply | Qty: 2 | Fill #5 | Status: AC

## 2023-05-08 ENCOUNTER — Other Ambulatory Visit: Payer: Self-pay

## 2023-05-11 ENCOUNTER — Other Ambulatory Visit: Payer: Self-pay | Admitting: Physician Assistant

## 2023-05-11 DIAGNOSIS — I25118 Atherosclerotic heart disease of native coronary artery with other forms of angina pectoris: Secondary | ICD-10-CM

## 2023-05-12 ENCOUNTER — Other Ambulatory Visit: Payer: Self-pay

## 2023-05-13 ENCOUNTER — Other Ambulatory Visit: Payer: Self-pay

## 2023-05-13 MED ORDER — METOPROLOL TARTRATE 50 MG PO TABS
50.0000 mg | ORAL_TABLET | Freq: Once | ORAL | 0 refills | Status: DC
Start: 2023-05-13 — End: 2023-05-20
  Filled 2023-05-13: qty 1, 1d supply, fill #0

## 2023-05-20 ENCOUNTER — Other Ambulatory Visit: Payer: Self-pay

## 2023-05-20 ENCOUNTER — Encounter: Payer: Self-pay | Admitting: Physician Assistant

## 2023-05-20 ENCOUNTER — Ambulatory Visit: Attending: Physician Assistant | Admitting: Physician Assistant

## 2023-05-20 VITALS — BP 124/70 | HR 72 | Ht 70.0 in | Wt 234.0 lb

## 2023-05-20 DIAGNOSIS — I10 Essential (primary) hypertension: Secondary | ICD-10-CM | POA: Diagnosis not present

## 2023-05-20 DIAGNOSIS — E785 Hyperlipidemia, unspecified: Secondary | ICD-10-CM

## 2023-05-20 DIAGNOSIS — I25110 Atherosclerotic heart disease of native coronary artery with unstable angina pectoris: Secondary | ICD-10-CM | POA: Diagnosis not present

## 2023-05-20 DIAGNOSIS — Z79899 Other long term (current) drug therapy: Secondary | ICD-10-CM

## 2023-05-20 DIAGNOSIS — R072 Precordial pain: Secondary | ICD-10-CM | POA: Diagnosis not present

## 2023-05-20 NOTE — Patient Instructions (Signed)
 Medication Instructions:  Your Physician recommend you continue on your current medication as directed.    *If you need a refill on your cardiac medications before your next appointment, please call your pharmacy*  Lab Work: Your provider would like for you to have following labs drawn today BMeT, and CBC.   If you have labs (blood work) drawn today and your tests are completely normal, you will receive your results only by: MyChart Message (if you have MyChart) OR A paper copy in the mail If you have any lab test that is abnormal or we need to change your treatment, we will call you to review the results.  Testing/Procedures:  [place] A DEPT OF [place]. [facility] [place] [address] [place] [address] Dept: [phone] Loc: [phone]  Casey Golden  05/20/2023  You are scheduled for a Cardiac Catheterization on Tuesday, April 22 with Dr. Veryl Golden End.  1. Please arrive at the Heart & Vascular Center Entrance of [facility], [address], [place] [address] at 9:30 AM (This is 1 hour(s) prior to your procedure time).  Proceed to the Check-In Desk directly inside the entrance.  Procedure Parking: Use the entrance off of the [address] side of the hospital. Turn right upon entering and follow the driveway to parking that is directly in front of the Heart & Vascular Center. There is no valet parking available at this entrance, however there is an awning directly in front of the Heart & Vascular Center for drop off/ pick up for patients.  Special note: Every effort is made to have your procedure done on time. Please understand that emergencies sometimes delay scheduled procedures.  2. Diet: Do not eat solid foods after midnight.  The patient may have clear liquids until 5am upon the day of the procedure.  3. Labs: You will need to have blood drawn today  4. Medication instructions in  preparation for your procedure:   Contrast Allergy: No  Please hold your TRULICITY  inject (shot) until after your cath.  On the morning of your procedure, take your Aspirin  81 mg.  You may use sips of water.  5. Plan to go home the same day, you will only stay overnight if medically necessary. 6. Bring a current list of your medications and current insurance cards. 7. You MUST have a responsible person to drive you home. 8. Someone MUST be with you the first 24 hours after you arrive home or your discharge will be delayed. 9. Please wear clothes that are easy to get on and off and wear slip-on shoes.  Thank you for allowing us  to care for you!   -- [facility] [facility]   Follow-Up: At [facility], you and your health needs are our priority.  As part of our continuing mission to provide you with exceptional heart care, our providers are all part of one team.  This team includes your primary Cardiologist (physician) and Advanced Practice Providers or APPs (Physician Assistants and Nurse Practitioners) who all work together to provide you with the care you need, when you need it.  Your next appointment:   1-2 week(s)  Provider:   You may see Casey Gollan, MD or Casey Gentleman, PA-C

## 2023-05-20 NOTE — H&P (View-Only) (Signed)
 Cardiology Office Note    Date:  05/20/2023   ID:  Casey, Golden 1950-03-02, MRN [id number]  PCP:  Claire Crick, MD  Cardiologist:  Belva Boyden, MD  Electrophysiologist:  None   Chief Complaint: Follow-up  History of Present Illness:   Casey Golden is a 73 y.o. male with history of nonobstructive CAD by LHC in 01/2021, DM 2, HTN, HLD, syncope, prior tobacco use quitting in 1990, chronic low back pain, osteoarthritis, and extramammary Paget's disease of the scrotum who presents for follow-up of chest pain.  Longstanding history of nonobstructive CAD with positive nuclear stress test leading to cath 04/2006 showing normal LVEF, 50% mid RCA disease, and 30% LAD disease. Cath 11/2007 with 40% mid RCA disease, normal LVEF. Multiple ED visits in late 2022/early 2023 for recurrent chest pain.  Most recent LHC in 01/2021 showed mild to moderate, nonobstructive CAD including long segment of mid LAD stenosis of up to 30 to 40% with 20% ostial D2 stenosis, and 20 to 30% proximal RCA stenosis.  Hyperdynamic LV systolic function with mildly elevated LVEDP.  Medical therapy was recommended.  He was seen in the ED in 03/2023 with chest discomfort provoked by walking in his yard.  High-sensitivity troponin negative x 2.  Chest x-ray without acute cardiopulmonary process.  EKG without acute ischemic changes.  He followed up in the office on 04/16/2023 and reported intermittent episodes of centrally located chest pressure.  Coronary CTA was recommended and remains pending at this time.  He comes in today continuing to note intermittent bandlike chest pain that occurs with rest, though also randomly.  No associated symptoms with the chest discomfort.  Discomfort is occurring more frequently and is also more intense with last episode of significant discomfort on 4/20.  Woke up this morning and felt well.  Did not start metoprolol .  No dizziness, presyncope, or syncope.   Labs independently  reviewed: 03/2023 - Hgb 17.0, PLT 240, potassium 4.4, BUN 17, serum creatinine 0.87 12/2022 - A1c 5.9 11/2022 - TSH normal, albumin 4.3, AST/ALT normal 07/2022 - TC 94, TG 137, HDL 29, LDL 38  Past Medical History:  Diagnosis Date   Arthritis    CAD (coronary artery disease)    cathx3 with nonobstructive disease.   Cancer [facility])    Cataract 2019   bilateral; resolved with surgery   Colonic polyp    Fatty liver disease, nonalcoholic 2015   by US    GERD (gastroesophageal reflux disease)    History of chicken pox    Hyperlipidemia    Hypertension    Nocturia    OSA (obstructive sleep apnea)    CPAP, compliant   Paget's disease of bony pelvis    Past use of tobacco    Quit 1990, 70 pack year history   Rash of genital area    09-08-2014  per pt Dr Derrick Fling aware   Seasonal and perennial allergic rhinitis    Stroke (HCC)    Type 2 diabetes mellitus (HCC)    Wears dentures    full upper/  partial lower   Wears glasses     Past Surgical History:  Procedure Laterality Date   ABDOMINAL HERNIA REPAIR  2007      [facility]   open repair   APPENDECTOMY  age 72   CARDIAC CATHETERIZATION  12-23-2007   [facility]   Abnormal myoview w/ ischemia/  40% mRCA with nonobstructive and no sig. plaque in his left system, EF 55%   CARDIAC  CATHETERIZATION  Apr 2008    [facility]   Abnormal myoview/  50% RCA,  ef 65%   CARDIAC CATHETERIZATION  1999      [facility]   CATARACT EXTRACTION, BILATERAL Bilateral 11/2017   CERVICAL FUSION  1992   CHOLECYSTECTOMY OPEN  2006   COLONOSCOPY WITH PROPOFOL  N/A 04/17/2016   TAs, high grade dysplasia with margins clear, diverticulosis Marnee Sink, MD)   COLONOSCOPY WITH PROPOFOL  N/A 08/07/2016   TAx1, diverticulosis, rpt 3 yrs ([facility])   COLONOSCOPY WITH PROPOFOL  N/A 01/05/2020   SSP, rpt Ole Berkeley, Darren, MD)   DENTAL SURGERY     metal dental implant L mandible   EXCISION OF SKIN TAG Right 09/14/2014   Procedure: EXCISION OF SKIN TAG;  Surgeon: Christina Coyer, MD;  Location:  [facility];  Service: Urology;  Laterality: Right;   GROIN MASS OPEN BIOPSY Left 01/17/2019   HYDROCELE EXCISION Left 09/14/2014   Procedure: LEFT HYDROCELECTOMY ADULT;  Surgeon: Christina Coyer, MD;  Location: [facility];  Service: Urology;  Laterality: Left;   LEFT HEART CATH AND CORONARY ANGIOGRAPHY Left 02/15/2021   Procedure: LEFT HEART CATH AND CORONARY ANGIOGRAPHY;  Surgeon: Sammy Crisp, MD;  Location: [facility] INVASIVE CV LAB;  Service: Cardiovascular;  Laterality: Left;   MOHS SURGERY  2015   skin cancer   TONSILLECTOMY  age 1    Current Medications: Current Meds  Medication Sig   Accu-Chek Softclix Lancets lancets Use to check blood glucose 3 times a day   aspirin  81 MG EC tablet Take 1 tablet (81 mg total) by mouth daily.   atorvastatin  (LIPITOR) 40 MG tablet Take 1 tablet (40 mg total) by mouth daily.   Blood Glucose Monitoring Suppl (BLOOD GLUCOSE MONITOR SYSTEM) w/Device KIT Use to check blood sugar 3 (three) times daily.   cyanocobalamin  (VITAMIN B12) 1000 MCG tablet Take 1 tablet (1,000 mcg total) by mouth daily.   diclofenac  sodium (VOLTAREN ) 1 % GEL APPLY 2 G TOPICALLY 3 (THREE) TIMES DAILY AS NEEDED (ANTI INFLAMMATORY).   gabapentin  (NEURONTIN ) 300 MG capsule Take 1 capsule (300 mg total) by mouth 2 (two) times daily.   glucose blood test strip Use to check blood glucose 3 times a day   ibuprofen (ADVIL,MOTRIN) 200 MG tablet Take 200-600 mg by mouth every 8 (eight) hours as needed for mild pain (severe back pain.).   Lancets (ONETOUCH DELICA PLUS LANCET33G) MISC Use to check blood glucose 3 (three) times daily.   losartan  (COZAAR ) 100 MG tablet Take 1 tablet (100 mg total) by mouth daily.   nitroGLYCERIN  (NITROSTAT ) 0.4 MG SL tablet Place 1 tablet (0.4 mg total) under the tongue every 5 (five) minutes as needed for chest pain (do not take more than 3 doses.).   vitamin E  1000 UNIT capsule Take 1 capsule (1,000 Units total) by mouth in the  morning and at bedtime.    Allergies:   Aricept  [donepezil ], Elemental sulfur, Imdur  [isosorbide  nitrate], and Sulfa antibiotics   Social History   Socioeconomic History   Marital status: Married    Spouse name: Not on file   Number of children: 3   Years of education: Not on file   Highest education level: 12th grade  Occupational History   Occupation: Full time    Employer: [employer]  Tobacco Use   Smoking status: Former    Current packs/day: 0.00    Average packs/day: 2.0 packs/day for 35.0 years (70.0 ttl pk-yrs)    Types: Cigarettes  Start date: 01/30/1955    Quit date: 01/29/1990    Years since quitting: 33.3   Smokeless tobacco: Never  Vaping Use   Vaping status: Never Used  Substance and Sexual Activity   Alcohol use: No   Drug use: No   Sexual activity: Yes  Other Topics Concern   Not on file  Social History Narrative   Lives with wife, dog and cats    Occupation: retired, was self employed, now works at [employer]    Edu: HS   Activity: walks 1.5 mi daily   Diet: some water, fruits/vegetables daily   Social Drivers of Corporate investment banker Strain: Low Risk  (12/23/2022)   Overall Financial Resource Strain (CARDIA)    Difficulty of Paying Living Expenses: Not hard at all  Food Insecurity: No Food Insecurity (12/23/2022)   Hunger Vital Sign    Worried About Running Out of Food in the Last Year: Never true    Ran Out of Food in the Last Year: Never true  Transportation Needs: No Transportation Needs (12/23/2022)   PRAPARE - Administrator, Civil Service (Medical): No    Lack of Transportation (Non-Medical): No  Physical Activity: Insufficiently Active (12/23/2022)   Exercise Vital Sign    Days of Exercise per Week: 2 days    Minutes of Exercise per Session: 30 min  Stress: No Stress Concern Present (12/23/2022)   [employer] of Occupational Health - Occupational Stress Questionnaire    Feeling of Stress : Not at all   Social Connections: Unknown (12/23/2022)   Social Connection and Isolation Panel [NHANES]    Frequency of Communication with Friends and Family: More than three times a week    Frequency of Social Gatherings with Friends and Family: Twice a week    Attends Religious Services: Patient declined    Database administrator or Organizations: No    Attends Engineer, structural: Never    Marital Status: Married     Family History:  The patient's family history includes CAD in his mother; Cancer in his sister; Cancer (age of onset: 44) in his father; Diabetes in his mother and paternal grandfather; Heart disease in his paternal grandfather and paternal grandmother; Stroke in his mother. There is no history of Coronary artery disease or Sleep apnea.  ROS:   12-point review of systems is negative unless otherwise noted in the HPI.   EKGs/Labs/Other Studies Reviewed:    Studies reviewed were summarized above. The additional studies were reviewed today:  LHC 02/15/2021:   2nd Diag lesion is 20% stenosed.   Mid LAD to Dist LAD lesion is 35% stenosed.   Prox RCA lesion is 25% stenosed.   There is hyperdynamic left ventricular systolic function.   LV end diastolic pressure is mildly elevated.   The left ventricular ejection fraction is greater than 65% by visual estimate.   There is no aortic valve stenosis.   Conclusions: Mild-moderate, non-obstructive coronary artery disease, including long segment of mid LAD stenosis of up to 30-40%, 20% ostial D2 stenosis, and 20-30% proximal RCA lesion. Hyperdynamic left ventricular systolic function with mildly elevated filling pressure (LVEDP ~20 mmHg).   Recommendations: No significant coronary lesion to explain accelerating angina.  Will add isosorbide  mononitrate 15 mg daily. Medical therapy and risk factor modification to prevent progression of disease.   EKG:  EKG is ordered today.  The EKG ordered today demonstrates NSR, 65 bpm,  first-degree AV block, possible prior anterior  infarct versus lead placement, no acute ST-T changes  Recent Labs: 12/20/2022: ALT 35 12/24/2022: TSH 0.91 04/10/2023: Hemoglobin 17.0; Platelets 240 04/16/2023: BUN 17; Creatinine, Ser 0.86; Potassium 4.5; Sodium 142  Recent Lipid Panel    Component Value Date/Time   CHOL 94 08/13/2022 0512   CHOL 179 07/21/2014 0935   TRIG 137 08/13/2022 0512   TRIG 192 10/03/2007 0000   HDL 29 (L) 08/13/2022 0512   HDL 31 (L) 07/21/2014 0935   CHOLHDL 3.2 08/13/2022 0512   VLDL 27 08/13/2022 0512   LDLCALC 38 08/13/2022 0512   LDLCALC 74 07/21/2014 0935   LDLDIRECT 54.0 02/13/2021 0852    PHYSICAL EXAM:    VS:  BP 124/70 (BP Location: Left Arm, Patient Position: Sitting, Cuff Size: Normal)   Pulse 72   Ht 5\' 10"  (1.778 m)   Wt 234 lb (106.1 kg)   SpO2 98%   BMI 33.58 kg/m   BMI: Body mass index is 33.58 kg/m.  Physical Exam Vitals reviewed.  Constitutional:      Appearance: He is well-developed.  HENT:     Head: Normocephalic and atraumatic.  Eyes:     General:        Right eye: No discharge.        Left eye: No discharge.  Cardiovascular:     Rate and Rhythm: Normal rate and regular rhythm.     Heart sounds: Normal heart sounds, S1 normal and S2 normal. Heart sounds not distant. No midsystolic click and no opening snap. No murmur heard.    No friction rub.  Pulmonary:     Effort: Pulmonary effort is normal. No respiratory distress.     Breath sounds: Normal breath sounds. No decreased breath sounds, wheezing, rhonchi or rales.  Chest:     Chest wall: No tenderness.  Musculoskeletal:     Cervical back: Normal range of motion.     Right lower leg: No edema.     Left lower leg: No edema.  Skin:    General: Skin is warm and dry.     Nails: There is no clubbing.  Neurological:     Mental Status: He is alert and oriented to person, place, and time.  Psychiatric:        Speech: Speech normal.        Behavior: Behavior normal.         Thought Content: Thought content normal.        Judgment: Judgment normal.     Wt Readings from Last 3 Encounters:  05/20/23 234 lb (106.1 kg)  04/16/23 232 lb (105.2 kg)  04/10/23 231 lb 7.7 oz (105 kg)     ASSESSMENT & PLAN:   CAD involving the native coronary arteries with unstable angina: Continues to report intermittent chest discomfort.  Coronary CTA was not scheduled for unclear reasons.  Given ongoing and escalation of symptoms we have elected to pursue diagnostic LHC in place of coronary CTA for definitive evaluation.  Continue aspirin  and atorvastatin .  Previously intolerant to Imdur  secondary to headache.  Has not yet started metoprolol , await cath results.  HTN: Blood pressure is well-controlled in the office today.  He remains on losartan  100 mg.  HLD: LDL 38 in 07/2022.  He remains on atorvastatin  40 mg.   Informed Consent   Shared Decision Making/Informed Consent{  The risks [stroke (1 in 1000), death (1 in 1000), kidney failure [usually temporary] (1 in 500), bleeding (1 in 200), allergic reaction [possibly serious] (1 in  200)], benefits (diagnostic support and management of coronary artery disease) and alternatives of a cardiac catheterization were discussed in detail with Mr. Brobeck and he is willing to proceed.        Disposition: F/u with Dr. Gollan or an APP 1 to 2 weeks after.   Medication Adjustments/Labs and Tests Ordered: Current medicines are reviewed at length with the patient today.  Concerns regarding medicines are outlined above. Medication changes, Labs and Tests ordered today are summarized above and listed in the Patient Instructions accessible in Encounters.   Signed, Varney Gentleman, PA-C 05/20/2023 10:08 AM     [facility] [place] [address] [place], [place] [address] [phone]

## 2023-05-20 NOTE — Progress Notes (Signed)
 Cardiology Office Note    Date:  05/20/2023   ID:  [name], [name] 1950-03-02, MRN [id number]  PCP:  Claire Crick, MD  Cardiologist:  Belva Boyden, MD  Electrophysiologist:  None   Chief Complaint: Follow-up  History of Present Illness:   Casey Golden is a 73 y.o. male with history of nonobstructive CAD by LHC in 01/2021, DM 2, HTN, HLD, syncope, prior tobacco use quitting in 1990, chronic low back pain, osteoarthritis, and extramammary Paget's disease of the scrotum who presents for follow-up of chest pain.  Longstanding history of nonobstructive CAD with positive nuclear stress test leading to cath 04/2006 showing normal LVEF, 50% mid RCA disease, and 30% LAD disease. Cath 11/2007 with 40% mid RCA disease, normal LVEF. Multiple ED visits in late 2022/early 2023 for recurrent chest pain.  Most recent LHC in 01/2021 showed mild to moderate, nonobstructive CAD including long segment of mid LAD stenosis of up to 30 to 40% with 20% ostial D2 stenosis, and 20 to 30% proximal RCA stenosis.  Hyperdynamic LV systolic function with mildly elevated LVEDP.  Medical therapy was recommended.  He was seen in the ED in 03/2023 with chest discomfort provoked by walking in his yard.  High-sensitivity troponin negative x 2.  Chest x-ray without acute cardiopulmonary process.  EKG without acute ischemic changes.  He followed up in the office on 04/16/2023 and reported intermittent episodes of centrally located chest pressure.  Coronary CTA was recommended and remains pending at this time.  He comes in today continuing to note intermittent bandlike chest pain that occurs with rest, though also randomly.  No associated symptoms with the chest discomfort.  Discomfort is occurring more frequently and is also more intense with last episode of significant discomfort on 4/20.  Woke up this morning and felt well.  Did not start metoprolol .  No dizziness, presyncope, or syncope.   Labs independently  reviewed: 03/2023 - Hgb 17.0, PLT 240, potassium 4.4, BUN 17, serum creatinine 0.87 12/2022 - A1c 5.9 11/2022 - TSH normal, albumin 4.3, AST/ALT normal 07/2022 - TC 94, TG 137, HDL 29, LDL 38  Past Medical History:  Diagnosis Date   Arthritis    CAD (coronary artery disease)    cathx3 with nonobstructive disease.   Cancer [facility])    Cataract 2019   bilateral; resolved with surgery   Colonic polyp    Fatty liver disease, nonalcoholic 2015   by US    GERD (gastroesophageal reflux disease)    History of chicken pox    Hyperlipidemia    Hypertension    Nocturia    OSA (obstructive sleep apnea)    CPAP, compliant   Paget's disease of bony pelvis    Past use of tobacco    Quit 1990, 70 pack year history   Rash of genital area    09-08-2014  per pt Dr Derrick Fling aware   Seasonal and perennial allergic rhinitis    Stroke (HCC)    Type 2 diabetes mellitus (HCC)    Wears dentures    full upper/  partial lower   Wears glasses     Past Surgical History:  Procedure Laterality Date   ABDOMINAL HERNIA REPAIR  2007      [facility]   open repair   APPENDECTOMY  age 72   CARDIAC CATHETERIZATION  12-23-2007   [facility]   Abnormal myoview w/ ischemia/  40% mRCA with nonobstructive and no sig. plaque in his left system, EF 55%   CARDIAC  CATHETERIZATION  Apr 2008    [facility]   Abnormal myoview/  50% RCA,  ef 65%   CARDIAC CATHETERIZATION  1999      [facility]   CATARACT EXTRACTION, BILATERAL Bilateral 11/2017   CERVICAL FUSION  1992   CHOLECYSTECTOMY OPEN  2006   COLONOSCOPY WITH PROPOFOL  N/A 04/17/2016   TAs, high grade dysplasia with margins clear, diverticulosis Marnee Sink, MD)   COLONOSCOPY WITH PROPOFOL  N/A 08/07/2016   TAx1, diverticulosis, rpt 3 yrs ([facility])   COLONOSCOPY WITH PROPOFOL  N/A 01/05/2020   SSP, rpt Ole Berkeley, Darren, MD)   DENTAL SURGERY     metal dental implant L mandible   EXCISION OF SKIN TAG Right 09/14/2014   Procedure: EXCISION OF SKIN TAG;  Surgeon: Christina Coyer, MD;  Location:  [facility];  Service: Urology;  Laterality: Right;   GROIN MASS OPEN BIOPSY Left 01/17/2019   HYDROCELE EXCISION Left 09/14/2014   Procedure: LEFT HYDROCELECTOMY ADULT;  Surgeon: Christina Coyer, MD;  Location: [facility];  Service: Urology;  Laterality: Left;   LEFT HEART CATH AND CORONARY ANGIOGRAPHY Left 02/15/2021   Procedure: LEFT HEART CATH AND CORONARY ANGIOGRAPHY;  Surgeon: Sammy Crisp, MD;  Location: [facility] INVASIVE CV LAB;  Service: Cardiovascular;  Laterality: Left;   MOHS SURGERY  2015   skin cancer   TONSILLECTOMY  age 1    Current Medications: Current Meds  Medication Sig   Accu-Chek Softclix Lancets lancets Use to check blood glucose 3 times a day   aspirin  81 MG EC tablet Take 1 tablet (81 mg total) by mouth daily.   atorvastatin  (LIPITOR) 40 MG tablet Take 1 tablet (40 mg total) by mouth daily.   Blood Glucose Monitoring Suppl (BLOOD GLUCOSE MONITOR SYSTEM) w/Device KIT Use to check blood sugar 3 (three) times daily.   cyanocobalamin  (VITAMIN B12) 1000 MCG tablet Take 1 tablet (1,000 mcg total) by mouth daily.   diclofenac  sodium (VOLTAREN ) 1 % GEL APPLY 2 G TOPICALLY 3 (THREE) TIMES DAILY AS NEEDED (ANTI INFLAMMATORY).   gabapentin  (NEURONTIN ) 300 MG capsule Take 1 capsule (300 mg total) by mouth 2 (two) times daily.   glucose blood test strip Use to check blood glucose 3 times a day   ibuprofen (ADVIL,MOTRIN) 200 MG tablet Take 200-600 mg by mouth every 8 (eight) hours as needed for mild pain (severe back pain.).   Lancets (ONETOUCH DELICA PLUS LANCET33G) MISC Use to check blood glucose 3 (three) times daily.   losartan  (COZAAR ) 100 MG tablet Take 1 tablet (100 mg total) by mouth daily.   nitroGLYCERIN  (NITROSTAT ) 0.4 MG SL tablet Place 1 tablet (0.4 mg total) under the tongue every 5 (five) minutes as needed for chest pain (do not take more than 3 doses.).   vitamin E  1000 UNIT capsule Take 1 capsule (1,000 Units total) by mouth in the  morning and at bedtime.    Allergies:   Aricept  [donepezil ], Elemental sulfur, Imdur  [isosorbide  nitrate], and Sulfa antibiotics   Social History   Socioeconomic History   Marital status: Married    Spouse name: Not on file   Number of children: 3   Years of education: Not on file   Highest education level: 12th grade  Occupational History   Occupation: Full time    Employer: [employer]  Tobacco Use   Smoking status: Former    Current packs/day: 0.00    Average packs/day: 2.0 packs/day for 35.0 years (70.0 ttl pk-yrs)    Types: Cigarettes  Start date: 01/30/1955    Quit date: 01/29/1990    Years since quitting: 33.3   Smokeless tobacco: Never  Vaping Use   Vaping status: Never Used  Substance and Sexual Activity   Alcohol use: No   Drug use: No   Sexual activity: Yes  Other Topics Concern   Not on file  Social History Narrative   Lives with wife, dog and cats    Occupation: retired, was self employed, now works at [employer]    Edu: HS   Activity: walks 1.5 mi daily   Diet: some water, fruits/vegetables daily   Social Drivers of Corporate investment banker Strain: Low Risk  (12/23/2022)   Overall Financial Resource Strain (CARDIA)    Difficulty of Paying Living Expenses: Not hard at all  Food Insecurity: No Food Insecurity (12/23/2022)   Hunger Vital Sign    Worried About Running Out of Food in the Last Year: Never true    Ran Out of Food in the Last Year: Never true  Transportation Needs: No Transportation Needs (12/23/2022)   PRAPARE - Administrator, Civil Service (Medical): No    Lack of Transportation (Non-Medical): No  Physical Activity: Insufficiently Active (12/23/2022)   Exercise Vital Sign    Days of Exercise per Week: 2 days    Minutes of Exercise per Session: 30 min  Stress: No Stress Concern Present (12/23/2022)   [employer] of Occupational Health - Occupational Stress Questionnaire    Feeling of Stress : Not at all   Social Connections: Unknown (12/23/2022)   Social Connection and Isolation Panel [NHANES]    Frequency of Communication with Friends and Family: More than three times a week    Frequency of Social Gatherings with Friends and Family: Twice a week    Attends Religious Services: Patient declined    Database administrator or Organizations: No    Attends Engineer, structural: Never    Marital Status: Married     Family History:  The patient's family history includes CAD in his mother; Cancer in his sister; Cancer (age of onset: 44) in his father; Diabetes in his mother and paternal grandfather; Heart disease in his paternal grandfather and paternal grandmother; Stroke in his mother. There is no history of Coronary artery disease or Sleep apnea.  ROS:   12-point review of systems is negative unless otherwise noted in the HPI.   EKGs/Labs/Other Studies Reviewed:    Studies reviewed were summarized above. The additional studies were reviewed today:  LHC 02/15/2021:   2nd Diag lesion is 20% stenosed.   Mid LAD to Dist LAD lesion is 35% stenosed.   Prox RCA lesion is 25% stenosed.   There is hyperdynamic left ventricular systolic function.   LV end diastolic pressure is mildly elevated.   The left ventricular ejection fraction is greater than 65% by visual estimate.   There is no aortic valve stenosis.   Conclusions: Mild-moderate, non-obstructive coronary artery disease, including long segment of mid LAD stenosis of up to 30-40%, 20% ostial D2 stenosis, and 20-30% proximal RCA lesion. Hyperdynamic left ventricular systolic function with mildly elevated filling pressure (LVEDP ~20 mmHg).   Recommendations: No significant coronary lesion to explain accelerating angina.  Will add isosorbide  mononitrate 15 mg daily. Medical therapy and risk factor modification to prevent progression of disease.   EKG:  EKG is ordered today.  The EKG ordered today demonstrates NSR, 65 bpm,  first-degree AV block, possible prior anterior  infarct versus lead placement, no acute ST-T changes  Recent Labs: 12/20/2022: ALT 35 12/24/2022: TSH 0.91 04/10/2023: Hemoglobin 17.0; Platelets 240 04/16/2023: BUN 17; Creatinine, Ser 0.86; Potassium 4.5; Sodium 142  Recent Lipid Panel    Component Value Date/Time   CHOL 94 08/13/2022 0512   CHOL 179 07/21/2014 0935   TRIG 137 08/13/2022 0512   TRIG 192 10/03/2007 0000   HDL 29 (L) 08/13/2022 0512   HDL 31 (L) 07/21/2014 0935   CHOLHDL 3.2 08/13/2022 0512   VLDL 27 08/13/2022 0512   LDLCALC 38 08/13/2022 0512   LDLCALC 74 07/21/2014 0935   LDLDIRECT 54.0 02/13/2021 0852    PHYSICAL EXAM:    VS:  BP 124/70 (BP Location: Left Arm, Patient Position: Sitting, Cuff Size: Normal)   Pulse 72   Ht 5\' 10"  (1.778 m)   Wt 234 lb (106.1 kg)   SpO2 98%   BMI 33.58 kg/m   BMI: Body mass index is 33.58 kg/m.  Physical Exam Vitals reviewed.  Constitutional:      Appearance: He is well-developed.  HENT:     Head: Normocephalic and atraumatic.  Eyes:     General:        Right eye: No discharge.        Left eye: No discharge.  Cardiovascular:     Rate and Rhythm: Normal rate and regular rhythm.     Heart sounds: Normal heart sounds, S1 normal and S2 normal. Heart sounds not distant. No midsystolic click and no opening snap. No murmur heard.    No friction rub.  Pulmonary:     Effort: Pulmonary effort is normal. No respiratory distress.     Breath sounds: Normal breath sounds. No decreased breath sounds, wheezing, rhonchi or rales.  Chest:     Chest wall: No tenderness.  Musculoskeletal:     Cervical back: Normal range of motion.     Right lower leg: No edema.     Left lower leg: No edema.  Skin:    General: Skin is warm and dry.     Nails: There is no clubbing.  Neurological:     Mental Status: He is alert and oriented to person, place, and time.  Psychiatric:        Speech: Speech normal.        Behavior: Behavior normal.         Thought Content: Thought content normal.        Judgment: Judgment normal.     Wt Readings from Last 3 Encounters:  05/20/23 234 lb (106.1 kg)  04/16/23 232 lb (105.2 kg)  04/10/23 231 lb 7.7 oz (105 kg)     ASSESSMENT & PLAN:   CAD involving the native coronary arteries with unstable angina: Continues to report intermittent chest discomfort.  Coronary CTA was not scheduled for unclear reasons.  Given ongoing and escalation of symptoms we have elected to pursue diagnostic LHC in place of coronary CTA for definitive evaluation.  Continue aspirin  and atorvastatin .  Previously intolerant to Imdur  secondary to headache.  Has not yet started metoprolol , await cath results.  HTN: Blood pressure is well-controlled in the office today.  He remains on losartan  100 mg.  HLD: LDL 38 in 07/2022.  He remains on atorvastatin  40 mg.   Informed Consent   Shared Decision Making/Informed Consent{  The risks [stroke (1 in 1000), death (1 in 1000), kidney failure [usually temporary] (1 in 500), bleeding (1 in 200), allergic reaction [possibly serious] (1 in  200)], benefits (diagnostic support and management of coronary artery disease) and alternatives of a cardiac catheterization were discussed in detail with Casey Golden and he is willing to proceed.        Disposition: F/u with Dr. Gollan or an APP 1 to 2 weeks after.   Medication Adjustments/Labs and Tests Ordered: Current medicines are reviewed at length with the patient today.  Concerns regarding medicines are outlined above. Medication changes, Labs and Tests ordered today are summarized above and listed in the Patient Instructions accessible in Encounters.   Signed, Varney Gentleman, PA-C 05/20/2023 10:08 AM     [facility] [place] [address] [place], [place] [address] [phone]

## 2023-05-21 ENCOUNTER — Encounter
Admission: RE | Disposition: A | Discharge status: Home / Self Care | Payer: Self-pay | Source: Home / Self Care | Attending: Internal Medicine

## 2023-05-21 ENCOUNTER — Telehealth: Payer: Self-pay | Admitting: *Deleted

## 2023-05-21 ENCOUNTER — Other Ambulatory Visit: Payer: Self-pay

## 2023-05-21 ENCOUNTER — Ambulatory Visit
Admission: RE | Admit: 2023-05-21 | Discharge: 2023-05-21 | Disposition: A | Discharge status: Home / Self Care | Attending: Internal Medicine | Admitting: Internal Medicine

## 2023-05-21 DIAGNOSIS — E119 Type 2 diabetes mellitus without complications: Secondary | ICD-10-CM | POA: Diagnosis not present

## 2023-05-21 DIAGNOSIS — I10 Essential (primary) hypertension: Secondary | ICD-10-CM | POA: Diagnosis not present

## 2023-05-21 DIAGNOSIS — Z79899 Other long term (current) drug therapy: Secondary | ICD-10-CM | POA: Insufficient documentation

## 2023-05-21 DIAGNOSIS — Z87891 Personal history of nicotine dependence: Secondary | ICD-10-CM | POA: Insufficient documentation

## 2023-05-21 DIAGNOSIS — I25110 Atherosclerotic heart disease of native coronary artery with unstable angina pectoris: Secondary | ICD-10-CM

## 2023-05-21 DIAGNOSIS — E785 Hyperlipidemia, unspecified: Secondary | ICD-10-CM | POA: Diagnosis not present

## 2023-05-21 DIAGNOSIS — I200 Unstable angina: Secondary | ICD-10-CM | POA: Diagnosis present

## 2023-05-21 DIAGNOSIS — R0789 Other chest pain: Secondary | ICD-10-CM | POA: Diagnosis present

## 2023-05-21 HISTORY — PX: LEFT HEART CATH AND CORONARY ANGIOGRAPHY: CATH118249

## 2023-05-21 LAB — BASIC METABOLIC PANEL WITH GFR
BUN/Creatinine Ratio: 19 (ref 10–24)
BUN: 16 mg/dL (ref 8–27)
CO2: 26 mmol/L (ref 20–29)
Calcium: 9.3 mg/dL (ref 8.6–10.2)
Chloride: 103 mmol/L (ref 96–106)
Creatinine, Ser: 0.84 mg/dL (ref 0.76–1.27)
Glucose: 107 mg/dL — ABNORMAL HIGH (ref 70–99)
Potassium: 4.5 mmol/L (ref 3.5–5.2)
Sodium: 142 mmol/L (ref 134–144)
eGFR: 91 mL/min/{1.73_m2} (ref >59)

## 2023-05-21 LAB — CBC
Hematocrit: 47.2 % (ref 37.5–51.0)
Hemoglobin: 16.4 g/dL (ref 13.0–17.7)
MCH: 34.6 pg — ABNORMAL HIGH (ref 26.6–33.0)
MCHC: 34.7 g/dL (ref 31.5–35.7)
MCV: 100 fL — ABNORMAL HIGH (ref 79–97)
Platelets: 234 10*3/uL (ref 150–450)
RBC: 4.74 x10E6/uL (ref 4.14–5.80)
RDW: 11.7 % (ref 11.6–15.4)
WBC: 7 10*3/uL (ref 3.4–10.8)

## 2023-05-21 SURGERY — LEFT HEART CATH AND CORONARY ANGIOGRAPHY
Anesthesia: Moderate Sedation | Laterality: Left

## 2023-05-21 MED ORDER — VERAPAMIL HCL 2.5 MG/ML IV SOLN
INTRAVENOUS | Status: AC
  Filled 2023-05-21: qty 2

## 2023-05-21 MED ORDER — SODIUM CHLORIDE 0.9 % WEIGHT BASED INFUSION: 1.0000 mL/kg/h | INTRAVENOUS | Status: DC

## 2023-05-21 MED ORDER — SODIUM CHLORIDE 0.9 % IV SOLN: 250.0000 mL | INTRAVENOUS | Status: DC | PRN

## 2023-05-21 MED ORDER — LIDOCAINE HCL 1 % IJ SOLN
INTRAMUSCULAR | Status: AC
  Filled 2023-05-21: qty 20

## 2023-05-21 MED ORDER — LABETALOL HCL 5 MG/ML IV SOLN: 10.0000 mg | INTRAVENOUS | Status: DC | PRN

## 2023-05-21 MED ORDER — ONDANSETRON HCL 4 MG/2ML IJ SOLN: 4.0000 mg | Freq: Four times a day (QID) | INTRAMUSCULAR | Status: DC | PRN

## 2023-05-21 MED ORDER — MIDAZOLAM HCL 2 MG/2ML IJ SOLN
INTRAMUSCULAR | Status: AC
  Filled 2023-05-21: qty 2

## 2023-05-21 MED ORDER — FENTANYL CITRATE (PF) 100 MCG/2ML IJ SOLN
INTRAMUSCULAR | Status: AC
  Filled 2023-05-21: qty 2

## 2023-05-21 MED ORDER — HEPARIN (PORCINE) IN NACL 1000-0.9 UT/500ML-% IV SOLN
INTRAVENOUS | Status: DC | PRN
  Administered 2023-05-21: 1000 mL

## 2023-05-21 MED ORDER — FENTANYL CITRATE (PF) 100 MCG/2ML IJ SOLN
INTRAMUSCULAR | Status: DC | PRN
  Administered 2023-05-21: 25 ug via INTRAVENOUS

## 2023-05-21 MED ORDER — HYDRALAZINE HCL 20 MG/ML IJ SOLN: 10.0000 mg | INTRAMUSCULAR | Status: DC | PRN

## 2023-05-21 MED ORDER — ACETAMINOPHEN 325 MG PO TABS: 650.0000 mg | ORAL_TABLET | ORAL | Status: DC | PRN

## 2023-05-21 MED ORDER — IOHEXOL 300 MG/ML  SOLN
INTRAMUSCULAR | Status: DC | PRN
  Administered 2023-05-21: 35 mL

## 2023-05-21 MED ORDER — VERAPAMIL HCL 2.5 MG/ML IV SOLN
INTRAVENOUS | Status: DC | PRN
Start: 2023-05-21 — End: 2023-05-21
  Administered 2023-05-21: 2.5 mg via INTRA_ARTERIAL

## 2023-05-21 MED ORDER — ASPIRIN 81 MG PO CHEW: 81.0000 mg | CHEWABLE_TABLET | ORAL | Status: DC

## 2023-05-21 MED ORDER — HEPARIN SODIUM (PORCINE) 1000 UNIT/ML IJ SOLN
INTRAMUSCULAR | Status: DC | PRN
  Administered 2023-05-21: 5000 [IU] via INTRAVENOUS

## 2023-05-21 MED ORDER — LIDOCAINE HCL (PF) 1 % IJ SOLN
INTRAMUSCULAR | Status: DC | PRN
  Administered 2023-05-21: 2 mL

## 2023-05-21 MED ORDER — SODIUM CHLORIDE 0.9% FLUSH: 3.0000 mL | INTRAVENOUS | Status: DC | PRN

## 2023-05-21 MED ORDER — SODIUM CHLORIDE 0.9 % WEIGHT BASED INFUSION: 3.0000 mL/kg/h | INTRAVENOUS | Status: AC

## 2023-05-21 MED ORDER — MIDAZOLAM HCL 2 MG/2ML IJ SOLN
INTRAMUSCULAR | Status: DC | PRN
  Administered 2023-05-21: 1 mg via INTRAVENOUS

## 2023-05-21 MED ORDER — FUROSEMIDE 20 MG PO TABS
20.0000 mg | ORAL_TABLET | Freq: Every day | ORAL | 5 refills | Status: DC
  Filled 2023-05-21: qty 30, 30d supply, fill #0
  Filled 2023-06-12 – 2023-06-13 (×2): qty 30, 30d supply, fill #1
  Filled 2023-07-10: qty 30, 30d supply, fill #2

## 2023-05-21 MED ORDER — SODIUM CHLORIDE 0.9% FLUSH: 3.0000 mL | Freq: Two times a day (BID) | INTRAVENOUS | Status: DC

## 2023-05-21 MED ORDER — HEPARIN SODIUM (PORCINE) 1000 UNIT/ML IJ SOLN
INTRAMUSCULAR | Status: AC
  Filled 2023-05-21: qty 10

## 2023-05-21 MED ORDER — HEPARIN (PORCINE) IN NACL 1000-0.9 UT/500ML-% IV SOLN
INTRAVENOUS | Status: AC
  Filled 2023-05-21: qty 1000

## 2023-05-21 SURGICAL SUPPLY — 9 items
CATH INFINITI 5FR ANG PIGTAIL (CATHETERS) IMPLANT
CATH INFINITI AMBI 5FR TG (CATHETERS) IMPLANT
DEVICE RAD TR BAND REGULAR (VASCULAR PRODUCTS) IMPLANT
DRAPE BRACHIAL (DRAPES) IMPLANT
GLIDESHEATH SLEND SS 6F .021 (SHEATH) IMPLANT
GUIDEWIRE INQWIRE 1.5J.035X260 (WIRE) IMPLANT
PACK CARDIAC CATH (CUSTOM PROCEDURE TRAY) ×1 IMPLANT
SET ATX-X65L (MISCELLANEOUS) IMPLANT
STATION PROTECTION PRESSURIZED (MISCELLANEOUS) IMPLANT

## 2023-05-21 NOTE — Telephone Encounter (Signed)
 Patients daughter confirmed appointment was scheduled. No further needs.

## 2023-05-21 NOTE — Telephone Encounter (Signed)
-----   Message from [place] sent at 05/21/2023  1:38 PM EDT ----- Regarding: Echo Good afternoon,  Could you arrange for Mr. Fults to have an echocardiogram, ideally before his follow-up visit with Verdie Gladden on 06/07/2023?  Thanks.  Larinda Plover

## 2023-05-21 NOTE — Telephone Encounter (Signed)
 Received request from Dr. Nolan Battle for patient to have echo done before his upcoming appointment. Scheduling will reach out to patient to have this done.

## 2023-05-21 NOTE — Interval H&P Note (Signed)
 History and Physical Interval Note:  05/21/2023 9:41 AM  Casey Golden  has presented today for surgery, with the diagnosis of unstable Angina.  The various methods of treatment have been discussed with the patient and family. After consideration of risks, benefits and other options for treatment, the patient has consented to  Procedure(s): LEFT HEART CATH AND CORONARY ANGIOGRAPHY (Left) as a surgical intervention.  The patient's history has been reviewed, patient examined, no change in status, stable for surgery.  I have reviewed the patient's chart and labs.  Questions were answered to the patient's satisfaction.    Cath Lab Visit (complete for each Cath Lab visit)  Clinical Evaluation Leading to the Procedure:   ACS: No.  Non-ACS:    Anginal Classification: CCS IV  Anti-ischemic medical therapy: No Therapy  Non-Invasive Test Results: No non-invasive testing performed  Prior CABG: No previous CABG  Casey Golden

## 2023-05-22 ENCOUNTER — Encounter: Payer: Self-pay | Admitting: Internal Medicine

## 2023-05-23 ENCOUNTER — Other Ambulatory Visit: Payer: Self-pay

## 2023-05-23 ENCOUNTER — Ambulatory Visit: Attending: Internal Medicine

## 2023-05-23 DIAGNOSIS — I25110 Atherosclerotic heart disease of native coronary artery with unstable angina pectoris: Secondary | ICD-10-CM | POA: Diagnosis not present

## 2023-05-23 LAB — ECHOCARDIOGRAM COMPLETE
AR max vel: 2.96 cm2
AV Area VTI: 2.89 cm2
AV Area mean vel: 2.74 cm2
AV Mean grad: 3 mmHg
AV Peak grad: 5.6 mmHg
Ao pk vel: 1.18 m/s
Area-P 1/2: 2.76 cm2
S' Lateral: 3.07 cm

## 2023-05-24 ENCOUNTER — Other Ambulatory Visit: Payer: Self-pay

## 2023-05-29 ENCOUNTER — Other Ambulatory Visit: Payer: Self-pay

## 2023-05-29 MED FILL — Dulaglutide Soln Auto-injector 0.75 MG/0.5ML: SUBCUTANEOUS | 28 days supply | Qty: 2 | Fill #6 | Status: CN

## 2023-06-07 ENCOUNTER — Ambulatory Visit: Attending: Physician Assistant | Admitting: Physician Assistant

## 2023-06-07 ENCOUNTER — Encounter: Payer: Self-pay | Admitting: Physician Assistant

## 2023-06-07 ENCOUNTER — Other Ambulatory Visit: Payer: Self-pay

## 2023-06-07 VITALS — BP 122/66 | HR 68 | Ht 70.0 in | Wt 231.4 lb

## 2023-06-07 DIAGNOSIS — E782 Mixed hyperlipidemia: Secondary | ICD-10-CM | POA: Diagnosis not present

## 2023-06-07 DIAGNOSIS — I25118 Atherosclerotic heart disease of native coronary artery with other forms of angina pectoris: Secondary | ICD-10-CM

## 2023-06-07 DIAGNOSIS — I10 Essential (primary) hypertension: Secondary | ICD-10-CM

## 2023-06-07 DIAGNOSIS — Z79899 Other long term (current) drug therapy: Secondary | ICD-10-CM | POA: Diagnosis not present

## 2023-06-07 DIAGNOSIS — I5189 Other ill-defined heart diseases: Secondary | ICD-10-CM | POA: Diagnosis not present

## 2023-06-07 MED ORDER — AMLODIPINE BESYLATE 5 MG PO TABS
5.0000 mg | ORAL_TABLET | Freq: Every day | ORAL | 3 refills | Status: DC
  Filled 2023-06-07: qty 90, 90d supply, fill #0
  Filled 2023-08-12 – 2023-08-14 (×3): qty 90, 90d supply, fill #1
  Filled 2023-08-31 – 2023-11-09 (×4): qty 90, 90d supply, fill #2

## 2023-06-07 NOTE — Progress Notes (Signed)
 Cardiology Office Note    Date:  06/07/2023   ID:  Casey Golden, DOB 23-Aug-1950, MRN [id number]  PCP:  Claire Crick, MD  Cardiologist:  Belva Boyden, MD  Electrophysiologist:  None   Chief Complaint: Follow up  History of Present Illness:   Casey Golden is a 73 y.o. male with history of nonobstructive CAD 03/2023, T2DM, hypertension, hyperlipidemia, syncope, prior tobacco use (quit in 1990), chronic low back pain, osteoarthritis, and extramammary Paget's disease of the scrotum who presents for follow up on nonobstructive CAD.     Longstanding history of obstructive CAD with positive nuclear stress test leading to cath 04/2016 showing normal EF, 50% mid RCA disease, and 30% LAD disease.  Cath 11/2007 with 40% mid RCA disease, normal EF.  Multiple ED visits in late 2020/early 2023 for recurrent chest pain.  LHC 01/2021 showed mild to moderate nonobstructive CAD including long segment of mid LAD stenosis of up to 30 to 40% with 20% ostial D2 stenosis, and 20-30 percent proximal RCA stenosis.  Hyperdynamic LV systolic function with mildly elevated LVEDP.  Medical therapy was recommended.  He was seen in the ED 03/2023 with chest discomfort provoked by walking in his yard.  Troponin negative x 2.  Chest x-ray without acute cardiopulmonary process.  EKG without acute ischemic changes.  He followed up in office 04/16/2023 and reported intermittent episodes of centrally located chest pressure.  Coronary CTA was recommended.  However, this was not scheduled for unknown reason.    Patient was most recently seen 05/20/2023 for follow-up on chest pain.  He reported ongoing and escalating symptoms.  He was scheduled for LHC in place of coronary CTA for definitive evaluation.  LHC showed mild to moderate nonobstructive CAD with no significant change from prior catheterization in 01/2021.  Left ventricular filling pressure was moderately elevated (LVEDP 25 mmHg).  He was started on furosemide  20 mg daily  and recommended addition of amlodipine  if chest pain persists and/or patient had continued hypertension.  Echo showed EF 60 to 65%, no RWMA, G1 DD, normal RV function/size, and no significant valvular abnormalities.  Today patient reports overall doing well without symptoms of cardiac decompensation. He does endorse ongoing intermittent chest pressure. This comes on mostly at rest and lasts for short periods of time. He denies exertional angina. He has been tolerating furosemide  well with no adverse effects. He denies shortness of breath, lightheadedness, dizziness, orthopnea, and PND.   Labs independently reviewed: 05/20/2023- Hgb 16.4, HCT 47.2, BUN 16, Cr 0.84 Na 142, K 4.5 12/20/2022- normal LFTs 08/17/2022- TC 94, TG 137, HDL 29, LDL 38  Past Medical History:  Diagnosis Date   Arthritis    CAD (coronary artery disease)    cathx3 with nonobstructive disease.   [facility])    Cataract 2019   bilateral; resolved with surgery   Colonic polyp    Fatty liver disease, nonalcoholic 2015   by US    GERD (gastroesophageal reflux disease)    History of chicken pox    Hyperlipidemia    Hypertension    Nocturia    OSA (obstructive sleep apnea)    CPAP, compliant   Paget's disease of bony pelvis    Past use of tobacco    Quit 1990, 70 pack year history   Rash of genital area    09-08-2014  per pt Dr Derrick Fling aware   Seasonal and perennial allergic rhinitis    Stroke (HCC)    Type 2 diabetes mellitus (HCC)  Wears dentures    full upper/  partial lower   Wears glasses     Past Surgical History:  Procedure Laterality Date   ABDOMINAL HERNIA REPAIR  2007      [facility]   open repair   APPENDECTOMY  age 6   CARDIAC CATHETERIZATION  12-23-2007   [facility]   Abnormal myoview w/ ischemia/  40% mRCA with nonobstructive and no sig. plaque in his left system, EF 55%   CARDIAC CATHETERIZATION  Apr 2008    [facility]   Abnormal myoview/  50% RCA,  ef 65%   CARDIAC CATHETERIZATION  1999      [facility]    CATARACT EXTRACTION, BILATERAL Bilateral 11/2017   CERVICAL FUSION  1992   CHOLECYSTECTOMY OPEN  2006   COLONOSCOPY WITH PROPOFOL  N/A 04/17/2016   TAs, high grade dysplasia with margins clear, diverticulosis Marnee Sink, MD)   COLONOSCOPY WITH PROPOFOL  N/A 08/07/2016   TAx1, diverticulosis, rpt 3 yrs ([facility])   COLONOSCOPY WITH PROPOFOL  N/A 01/05/2020   SSP, rpt Ole Berkeley, Darren, MD)   DENTAL SURGERY     metal dental implant L mandible   EXCISION OF SKIN TAG Right 09/14/2014   Procedure: EXCISION OF SKIN TAG;  Surgeon: Christina Coyer, MD;  Location: [facility];  Service: Urology;  Laterality: Right;   GROIN MASS OPEN BIOPSY Left 01/17/2019   HYDROCELE EXCISION Left 09/14/2014   Procedure: LEFT HYDROCELECTOMY ADULT;  Surgeon: Christina Coyer, MD;  Location: [facility];  Service: Urology;  Laterality: Left;   LEFT HEART CATH AND CORONARY ANGIOGRAPHY Left 02/15/2021   Procedure: LEFT HEART CATH AND CORONARY ANGIOGRAPHY;  Surgeon: Sammy Crisp, MD;  Location: [facility] INVASIVE CV LAB;  Service: Cardiovascular;  Laterality: Left;   LEFT HEART CATH AND CORONARY ANGIOGRAPHY Left 05/21/2023   Procedure: LEFT HEART CATH AND CORONARY ANGIOGRAPHY;  Surgeon: Sammy Crisp, MD;  Location: [facility] INVASIVE CV LAB;  Service: Cardiovascular;  Laterality: Left;   MOHS SURGERY  2015   skin cancer   TONSILLECTOMY  age 45    Current Medications: Current Meds  Medication Sig   Accu-Chek Softclix Lancets lancets Use to check blood glucose 3 times a day   amLODipine  (NORVASC ) 5 MG tablet Take 1 tablet (5 mg total) by mouth daily.   aspirin  81 MG EC tablet Take 1 tablet (81 mg total) by mouth daily.   atorvastatin  (LIPITOR) 40 MG tablet Take 1 tablet (40 mg total) by mouth daily.   Blood Glucose Monitoring Suppl (BLOOD GLUCOSE MONITOR SYSTEM) w/Device KIT Use to check blood sugar 3 (three) times daily.   cyanocobalamin  (VITAMIN B12) 1000 MCG tablet Take 1 tablet (1,000 mcg total) by  mouth daily.   diclofenac  sodium (VOLTAREN ) 1 % GEL APPLY 2 G TOPICALLY 3 (THREE) TIMES DAILY AS NEEDED (ANTI INFLAMMATORY).   Dulaglutide  (TRULICITY ) 0.75 MG/0.5ML SOAJ Inject 0.75 mg into the skin once a week.   furosemide  (LASIX ) 20 MG tablet Take 1 tablet (20 mg total) by mouth daily.   gabapentin  (NEURONTIN ) 300 MG capsule Take 1 capsule (300 mg total) by mouth 2 (two) times daily.   glucose blood test strip Use to check blood glucose 3 times a day   ibuprofen (ADVIL,MOTRIN) 200 MG tablet Take 200-600 mg by mouth every 8 (eight) hours as needed for mild pain (severe back pain.).   Lancets (ONETOUCH DELICA PLUS LANCET33G) MISC Use to check blood glucose 3 (three) times daily.   losartan  (COZAAR ) 100 MG tablet Take 1 tablet (100  mg total) by mouth daily.   vitamin E  1000 UNIT capsule Take 1 capsule (1,000 Units total) by mouth in the morning and at bedtime.    Allergies:   Aricept  [donepezil ], Elemental sulfur, Imdur  [isosorbide  nitrate], and Sulfa antibiotics   Social History   Socioeconomic History   Marital status: Married    Spouse name: Not on file   Number of children: 3   Years of education: Not on file   Highest education level: 12th grade  Occupational History   Occupation: Full time    Employer: [employer]  Tobacco Use   Smoking status: Former    Current packs/day: 0.00    Average packs/day: 2.0 packs/day for 35.0 years (70.0 ttl pk-yrs)    Types: Cigarettes    Start date: 01/30/1955    Quit date: 01/29/1990    Years since quitting: 33.3   Smokeless tobacco: Never  Vaping Use   Vaping status: Never Used  Substance and Sexual Activity   Alcohol use: No   Drug use: No   Sexual activity: Yes  Other Topics Concern   Not on file  Social History Narrative   Lives with wife, dog and cats    Occupation: retired, was self employed, now works at [employer]    Edu: HS   Activity: walks 1.5 mi daily   Diet: some water, fruits/vegetables daily   Social Drivers of  Corporate investment banker Strain: Low Risk  (12/23/2022)   Overall Financial Resource Strain (CARDIA)    Difficulty of Paying Living Expenses: Not hard at all  Food Insecurity: No Food Insecurity (12/23/2022)   Hunger Vital Sign    Worried About Running Out of Food in the Last Year: Never true    Ran Out of Food in the Last Year: Never true  Transportation Needs: No Transportation Needs (12/23/2022)   PRAPARE - Administrator, Civil Service (Medical): No    Lack of Transportation (Non-Medical): No  Physical Activity: Insufficiently Active (12/23/2022)   Exercise Vital Sign    Days of Exercise per Week: 2 days    Minutes of Exercise per Session: 30 min  Stress: No Stress Concern Present (12/23/2022)   [employer] of Occupational Health - Occupational Stress Questionnaire    Feeling of Stress : Not at all  Social Connections: Unknown (12/23/2022)   Social Connection and Isolation Panel [NHANES]    Frequency of Communication with Friends and Family: More than three times a week    Frequency of Social Gatherings with Friends and Family: Twice a week    Attends Religious Services: Patient declined    Database administrator or Organizations: No    Attends Engineer, structural: Never    Marital Status: Married     Family History:  The patient's family history includes CAD in his mother; Cancer in his sister; Cancer (age of onset: 57) in his father; Diabetes in his mother and paternal grandfather; Heart disease in his paternal grandfather and paternal grandmother; Stroke in his mother. There is no history of Coronary artery disease or Sleep apnea.  ROS:   12-point review of systems is negative unless otherwise noted in the HPI.   EKGs/Labs/Other Studies Reviewed:    Studies reviewed were summarized above. The additional studies were reviewed today:  05/23/2023 Echo complete  1. Left ventricular ejection fraction, by estimation, is 60 to 65%. Left   ventricular ejection fraction by PLAX is 64 %. The left ventricle has  normal function. The left ventricle has no regional wall motion  abnormalities. Left ventricular diastolic  parameters are consistent with Grade I diastolic dysfunction (impaired  relaxation).   2. Right ventricular systolic function is normal. The right ventricular  size is normal.   3. The mitral valve is normal in structure. No evidence of mitral valve  regurgitation. No evidence of mitral stenosis.   4. The aortic valve is normal in structure. Aortic valve regurgitation is  not visualized. Aortic valve sclerosis is present, with no evidence of  aortic valve stenosis.   5. The inferior vena cava is normal in size with greater than 50%  respiratory variability, suggesting right atrial pressure of 3 mmHg.   05/21/2023 LHC Conclusions: Mild-moderate, nonobstructive coronary artery disease, as detailed below.  No significant change from prior catheterization in 01/2021. Moderately elevated left ventricular filling pressure (LVEDP 25 mmHg). Recommendations: Initiate furosemide  20 mg daily with BMP at follow-up. If chest pain persists and/or patient has continued hypertension, addition of amlodipine  should be considered for blood pressure control and antianginal therapy. Obtain echocardiogram.   EKG:  EKG is ordered today.  The EKG ordered today demonstrates sinus rhythm with first degree AV block.   Recent Labs: 12/20/2022: ALT 35 12/24/2022: TSH 0.91 05/20/2023: BUN 16; Creatinine, Ser 0.84; Hemoglobin 16.4; Platelets 234; Potassium 4.5; Sodium 142  Recent Lipid Panel    Component Value Date/Time   CHOL 94 08/13/2022 0512   CHOL 179 07/21/2014 0935   TRIG 137 08/13/2022 0512   TRIG 192 10/03/2007 0000   HDL 29 (L) 08/13/2022 0512   HDL 31 (L) 07/21/2014 0935   CHOLHDL 3.2 08/13/2022 0512   VLDL 27 08/13/2022 0512   LDLCALC 38 08/13/2022 0512   LDLCALC 74 07/21/2014 0935   LDLDIRECT 54.0 02/13/2021 0852     PHYSICAL EXAM:    VS:  BP 122/66 (BP Location: Left Arm, Patient Position: Sitting, Cuff Size: Normal)   Pulse 68   Ht 5\' 10"  (1.778 m)   Wt 231 lb 6.4 oz (105 kg)   SpO2 96%   BMI 33.20 kg/m   BMI: Body mass index is 33.2 kg/m.  Physical Exam Vitals and nursing note reviewed.  Constitutional:      Appearance: Normal appearance.  Cardiovascular:     Rate and Rhythm: Normal rate and regular rhythm.     Heart sounds: No murmur heard.    No gallop.  Pulmonary:     Effort: Pulmonary effort is normal.     Breath sounds: Normal breath sounds. No wheezing or rales.  Musculoskeletal:     Right lower leg: Edema (trace) present.     Left lower leg: Edema (trace) present.  Skin:    General: Skin is warm and dry.  Neurological:     General: No focal deficit present.     Mental Status: He is alert and oriented to person, place, and time. Mental status is at baseline.  Psychiatric:        Mood and Affect: Mood normal.        Behavior: Behavior normal.     Wt Readings from Last 3 Encounters:  06/07/23 231 lb 6.4 oz (105 kg)  05/21/23 230 lb (104.3 kg)  05/20/23 234 lb (106.1 kg)     ASSESSMENT & PLAN:   Coronary artery disease - LHC done 05/21/2023 showed nonobstructive CAD with no significant change from prior heart catheterization 01/2021. Echo showed normal EF with G1DD. He endorses ongoing intermittent chest pressure. He is  without exertional angina and cardiac decompensation. Patient was previously intolerant to Imdur  due to headache. Will defer BB at this time due to 1st degree AV block. Will start amlodipine  5 mg daily for antianginal therapy. Continue ASA and atorvastatin .   Diastolic dysfunction - LHC 05/21/2022 showed elevated LVEDP. Echo 05/23/2023 showed EF 60-65% with G1DD. Appears euvolemic on exam. He is continued on furosemide  20 mg daily (started after LHC). Will check BMP today.   Hypertension - BP well controlled. Continue losartan  100 mg daily. Add amlodipine   as above.   Hyperlipidemia - Most recent lipid panel 07/2022 with LDL 38. Continue atorvastatin  40 mg daily.    Disposition: F/u with Dr. Gollan or an APP in 2 months.   Medication Adjustments/Labs and Tests Ordered: Current medicines are reviewed at length with the patient today.  Concerns regarding medicines are outlined above. Medication changes, Labs and Tests ordered today are summarized above and listed in the Patient Instructions accessible in Encounters.   Beather Liming, PA-C 06/07/2023 11:09 AM     [facility] [place] [address] [place], [place] [address] [phone]

## 2023-06-07 NOTE — Patient Instructions (Signed)
 Medication Instructions:  Your physician recommends the following medication changes.  START TAKING: Amlodipine  5 mg 1 tablet daily *If you need a refill on your cardiac medications before your next appointment, please call your pharmacy*  Lab Work: Your provider would like for you to have following labs drawn today Basic Metabolic Panel.   If you have labs (blood work) drawn today and your tests are completely normal, you will receive your results only by: MyChart Message (if you have MyChart) OR A paper copy in the mail If you have any lab test that is abnormal or we need to change your treatment, we will call you to review the results.  Follow-Up: At [facility], you and your health needs are our priority.  As part of our continuing mission to provide you with exceptional heart care, our providers are all part of one team.  This team includes your primary Cardiologist (physician) and Advanced Practice Providers or APPs (Physician Assistants and Nurse Practitioners) who all work together to provide you with the care you need, when you need it.  Your next appointment:   2 month(s)  Provider:   You may see Timothy Gollan, MD or one of the following Advanced Practice Providers on your designated Care Team:   Gildardo Labrador, PA-C Varney Gentleman, PA-C   We recommend signing up for the patient portal called "MyChart".  Sign up information is provided on this After Visit Summary.  MyChart is used to connect with patients for Virtual Visits (Telemedicine).  Patients are able to view lab/test results, encounter notes, upcoming appointments, etc.  Non-urgent messages can be sent to your provider as well.   To learn more about what you can do with MyChart, go to [email or web address].

## 2023-06-08 LAB — BASIC METABOLIC PANEL WITH GFR
BUN/Creatinine Ratio: 21 (ref 10–24)
BUN: 19 mg/dL (ref 8–27)
CO2: 23 mmol/L (ref 20–29)
Calcium: 9.5 mg/dL (ref 8.6–10.2)
Chloride: 102 mmol/L (ref 96–106)
Creatinine, Ser: 0.9 mg/dL (ref 0.76–1.27)
Glucose: 99 mg/dL (ref 70–99)
Potassium: 4.5 mmol/L (ref 3.5–5.2)
Sodium: 143 mmol/L (ref 134–144)
eGFR: 89 mL/min/1.73

## 2023-06-10 ENCOUNTER — Ambulatory Visit: Admitting: Neurology

## 2023-06-10 ENCOUNTER — Encounter: Payer: Self-pay | Admitting: Emergency Medicine

## 2023-06-10 ENCOUNTER — Telehealth: Payer: Self-pay | Admitting: Neurology

## 2023-06-10 NOTE — Telephone Encounter (Signed)
 Pt reschedule appointment due to scheduling conflict with another appointment

## 2023-06-11 ENCOUNTER — Ambulatory Visit: Admitting: Neurology

## 2023-06-11 LAB — HM DIABETES EYE EXAM

## 2023-06-12 ENCOUNTER — Other Ambulatory Visit: Payer: Self-pay

## 2023-06-12 MED FILL — Dulaglutide Soln Auto-injector 0.75 MG/0.5ML: SUBCUTANEOUS | 28 days supply | Qty: 2 | Fill #6 | Status: AC

## 2023-07-10 ENCOUNTER — Other Ambulatory Visit: Payer: Self-pay | Admitting: Family Medicine

## 2023-07-10 ENCOUNTER — Other Ambulatory Visit: Payer: Self-pay

## 2023-07-10 DIAGNOSIS — E1169 Type 2 diabetes mellitus with other specified complication: Secondary | ICD-10-CM

## 2023-07-10 NOTE — Telephone Encounter (Signed)
 Trulicity  Last filled:  06/13/23, #2 mL Last OV:  03/12/23, 2-3 mo memory f/u Next OV:  none

## 2023-07-11 ENCOUNTER — Other Ambulatory Visit: Payer: Self-pay | Admitting: Family Medicine

## 2023-07-11 ENCOUNTER — Other Ambulatory Visit: Payer: Self-pay

## 2023-07-11 DIAGNOSIS — E1169 Type 2 diabetes mellitus with other specified complication: Secondary | ICD-10-CM

## 2023-07-11 NOTE — Telephone Encounter (Signed)
 Duplicate request (see 07/10/23 refill note).

## 2023-07-12 ENCOUNTER — Other Ambulatory Visit: Payer: Self-pay

## 2023-07-12 MED FILL — Dulaglutide Soln Auto-injector 0.75 MG/0.5ML: SUBCUTANEOUS | 28 days supply | Qty: 2 | Fill #0 | Status: AC

## 2023-07-15 ENCOUNTER — Telehealth: Payer: Self-pay

## 2023-07-15 NOTE — Telephone Encounter (Signed)
 Copied from CRM [id number]. Topic: Clinical - Lab/Test Results >> Jul 15, 2023  9:39 AM Dimple Francis wrote: Reason for CRM: Patient said he received a call a few weeks ago from Dr Mariam Shingles about lab results, has not been able to find the message and wanted him to reach out again

## 2023-07-15 NOTE — Telephone Encounter (Signed)
 Spoke with Casey Golden returning his call. Wanted to confirm his appt with Dr Crissie Dome on 07/19/23. Informed Casey Golden his appt is at 7:30 and to plz check in at the front desk by 7:15 or 7:20 so I can have him ready at 7:30. Casey Golden verbalizes understanding and expresses his thanks for the call back.

## 2023-07-19 ENCOUNTER — Encounter: Payer: Self-pay | Admitting: Family Medicine

## 2023-07-19 ENCOUNTER — Ambulatory Visit (INDEPENDENT_AMBULATORY_CARE_PROVIDER_SITE_OTHER)

## 2023-07-19 ENCOUNTER — Ambulatory Visit: Admitting: Family Medicine

## 2023-07-19 ENCOUNTER — Other Ambulatory Visit: Payer: Self-pay

## 2023-07-19 ENCOUNTER — Telehealth: Payer: Self-pay

## 2023-07-19 VITALS — BP 136/82 | HR 60 | Temp 98.0°F | Ht 70.0 in | Wt 233.5 lb

## 2023-07-19 DIAGNOSIS — R0789 Other chest pain: Secondary | ICD-10-CM | POA: Diagnosis not present

## 2023-07-19 DIAGNOSIS — E785 Hyperlipidemia, unspecified: Secondary | ICD-10-CM

## 2023-07-19 DIAGNOSIS — M2041 Other hammer toe(s) (acquired), right foot: Secondary | ICD-10-CM

## 2023-07-19 DIAGNOSIS — E1169 Type 2 diabetes mellitus with other specified complication: Secondary | ICD-10-CM

## 2023-07-19 DIAGNOSIS — Z7985 Long-term (current) use of injectable non-insulin antidiabetic drugs: Secondary | ICD-10-CM | POA: Diagnosis not present

## 2023-07-19 DIAGNOSIS — E538 Deficiency of other specified B group vitamins: Secondary | ICD-10-CM

## 2023-07-19 DIAGNOSIS — I10 Essential (primary) hypertension: Secondary | ICD-10-CM | POA: Diagnosis not present

## 2023-07-19 DIAGNOSIS — G3184 Mild cognitive impairment, so stated: Secondary | ICD-10-CM | POA: Diagnosis not present

## 2023-07-19 DIAGNOSIS — M2142 Flat foot [pes planus] (acquired), left foot: Secondary | ICD-10-CM | POA: Diagnosis not present

## 2023-07-19 DIAGNOSIS — I25118 Atherosclerotic heart disease of native coronary artery with other forms of angina pectoris: Secondary | ICD-10-CM

## 2023-07-19 DIAGNOSIS — M2141 Flat foot [pes planus] (acquired), right foot: Secondary | ICD-10-CM | POA: Diagnosis not present

## 2023-07-19 DIAGNOSIS — M2042 Other hammer toe(s) (acquired), left foot: Secondary | ICD-10-CM

## 2023-07-19 LAB — BASIC METABOLIC PANEL WITH GFR
BUN: 22 mg/dL (ref 6–23)
CO2: 32 meq/L (ref 19–32)
Calcium: 9.5 mg/dL (ref 8.4–10.5)
Chloride: 101 meq/L (ref 96–112)
Creatinine, Ser: 0.78 mg/dL (ref 0.40–1.50)
GFR: 87.42 mL/min (ref >60.00)
Glucose, Bld: 114 mg/dL — ABNORMAL HIGH (ref 70–99)
Potassium: 4.6 meq/L (ref 3.5–5.1)
Sodium: 139 meq/L (ref 135–145)

## 2023-07-19 LAB — LIPID PANEL
Cholesterol: 94 mg/dL (ref 0–200)
HDL: 32.7 mg/dL — ABNORMAL LOW (ref >39.00)
LDL Cholesterol: 39 mg/dL (ref 0–99)
NonHDL: 61.37
Total CHOL/HDL Ratio: 3
Triglycerides: 111 mg/dL (ref 0.0–149.0)
VLDL: 22.2 mg/dL (ref 0.0–40.0)

## 2023-07-19 LAB — HEMOGLOBIN A1C: Hgb A1c MFr Bld: 6.3 % (ref 4.6–6.5)

## 2023-07-19 LAB — VITAMIN B12: Vitamin B-12: 939 pg/mL — ABNORMAL HIGH (ref 211–911)

## 2023-07-19 MED ORDER — ISOSORBIDE MONONITRATE ER 30 MG PO TB24: 15.0000 mg | ORAL_TABLET | Freq: Every day | ORAL | 1 refills | Status: DC

## 2023-07-19 MED ORDER — ISOSORBIDE MONONITRATE ER 30 MG PO TB24
15.0000 mg | ORAL_TABLET | Freq: Every day | ORAL | 1 refills | Status: DC
  Filled 2023-07-19: qty 15, 30d supply, fill #0

## 2023-07-19 NOTE — Assessment & Plan Note (Signed)
 Suspect cardiac source.  Did recently complete reassuring cardiac evaluation. Has not tried sublingual nitroglycerin  yet. History isosorbide  intolerance-headache, sick feeling Suggest that he try sublingual nitroglycerin  for this chest discomfort and if helpful, may try low-dose isosorbide  50 mg daily sent to pharmacy. Keep cardiology appointment scheduled next month.

## 2023-07-19 NOTE — Assessment & Plan Note (Signed)
Update levels on daily replacement.  

## 2023-07-19 NOTE — Progress Notes (Signed)
 Patient presents today to pick up diabetic shoes and insoles.  Patient was dispensed 1 pair of diabetic shoes and 3 pairs of total contact diabetic insoles. Fit was satisfactory. Instructions for break-in and wear was reviewed and a copy was given to the patient.   Re-appointment for regularly scheduled diabetic foot care visits or if they should experience any trouble with the shoes or insoles.

## 2023-07-19 NOTE — Assessment & Plan Note (Signed)
 Appreciate cardiology care. Reviewed latest records including left heart catheterization and echocardiogram results. See above-start isosorbide .

## 2023-07-19 NOTE — Progress Notes (Signed)
 Ph: [phone] Fax: [phone]   Patient ID: Casey Golden, male    DOB: August 23, 1950, 73 y.o.   MRN: [id number]  This visit was conducted in person.  BP 136/82   Pulse 60   Temp 98 F (36.7 C) (Oral)   Ht 5' 10 (1.778 m)   Wt 233 lb 8 oz (105.9 kg)   SpO2 97%   BMI 33.50 kg/m    CC: med refill  Subjective:   HPI: Casey Golden is a 73 y.o. male presenting on 07/19/2023 for Medical Management of Chronic Issues (Here for med refill. )   See prior note for details.  Dx MCI with memory loss, MMSE 24/30 (11/2022) --> 22/30, CDT 4/4, started on aricept  5mg  nightly - this caused headache so it was stopped. Started on vitamin E  1000 units twice daily. Referred to neurology - appt scheduled for 09/2023.   B12 low normal 329 - continues oral B12 replacement.  Brain MRI 11/2022 showed mild chronic microvascular ischemic changes with prominence of supratentorial ventricular system suggestive of central parenchymal volume loss.   Since seen, developed chest pain, evaluated at ER with subsequent cardiology evaluation concerning for unstable angina s/p L heart catheterization completed 05/21/2023 showing mild-mod non-obstructive CAD - started on furosemide  20mg  daily in addition to amlodipine  5mg  daily. Last saw cardiology 06/07/2023   Echo 04/2023 showed EF 60 to 65%, no RWMA, G1 DD, normal RV function/size, and no significant valvular abnormalities.   Notes ongoing intermittent substernal chest pressure despite amlodipine . Imdur  previously caused headache, sick. Continues aspirin , atorvastatin , losartan  100mg  daily, trulicity  0.75mg  weekly. Has not tried SL nitroglycerin .  Lab Results  Component Value Date   HGBA1C 5.9 (A) 01/28/2023    Lab Results  Component Value Date   CHOL 94 08/13/2022   HDL 29 (L) 08/13/2022   LDLCALC 38 08/13/2022   LDLDIRECT 54.0 02/13/2021   TRIG 137 08/13/2022   CHOLHDL 3.2 08/13/2022        Relevant past medical, surgical, family and social  history reviewed and updated as indicated. Interim medical history since our last visit reviewed. Allergies and medications reviewed and updated. Outpatient Medications Prior to Visit  Medication Sig Dispense Refill   Accu-Chek Softclix Lancets lancets Use to check blood glucose 3 times a day 100 each 0   amLODipine  (NORVASC ) 5 MG tablet Take 1 tablet (5 mg total) by mouth daily. 180 tablet 3   aspirin  81 MG EC tablet Take 1 tablet (81 mg total) by mouth daily. 30 tablet 12   atorvastatin  (LIPITOR) 40 MG tablet Take 1 tablet (40 mg total) by mouth daily. 90 tablet 3   Blood Glucose Monitoring Suppl (BLOOD GLUCOSE MONITOR SYSTEM) w/Device KIT Use to check blood sugar 3 (three) times daily. 1 kit 0   cyanocobalamin  (VITAMIN B12) 1000 MCG tablet Take 1 tablet (1,000 mcg total) by mouth daily.     Dulaglutide  (TRULICITY ) 0.75 MG/0.5ML SOAJ Inject 0.75 mg into the skin once a week. 2 mL 6   furosemide  (LASIX ) 20 MG tablet Take 1 tablet (20 mg total) by mouth daily. 30 tablet 5   gabapentin  (NEURONTIN ) 300 MG capsule Take 1 capsule (300 mg total) by mouth 2 (two) times daily. 180 capsule 3   glucose blood test strip Use to check blood glucose 3 times a day 100 each 0   Lancets (ONETOUCH DELICA PLUS LANCET33G) MISC Use to check blood glucose 3 (three) times daily. 100 each 0   losartan  (COZAAR ) 100  MG tablet Take 1 tablet (100 mg total) by mouth daily. 90 tablet 3   nitroGLYCERIN  (NITROSTAT ) 0.4 MG SL tablet Place 1 tablet (0.4 mg total) under the tongue every 5 (five) minutes as needed for chest pain (do not take more than 3 doses.). 50 tablet 0   vitamin E  1000 UNIT capsule Take 1 capsule (1,000 Units total) by mouth in the morning and at bedtime.     ibuprofen (ADVIL,MOTRIN) 200 MG tablet Take 200-600 mg by mouth every 8 (eight) hours as needed for mild pain (severe back pain.).     diclofenac  sodium (VOLTAREN ) 1 % GEL APPLY 2 G TOPICALLY 3 (THREE) TIMES DAILY AS NEEDED (ANTI INFLAMMATORY). 100 g 1    No facility-administered medications prior to visit.     Per HPI unless specifically indicated in ROS section below Review of Systems  Objective:  BP 136/82   Pulse 60   Temp 98 F (36.7 C) (Oral)   Ht 5' 10 (1.778 m)   Wt 233 lb 8 oz (105.9 kg)   SpO2 97%   BMI 33.50 kg/m   Wt Readings from Last 3 Encounters:  07/19/23 233 lb 8 oz (105.9 kg)  06/07/23 231 lb 6.4 oz (105 kg)  05/21/23 230 lb (104.3 kg)      Physical Exam Vitals and nursing note reviewed.  Constitutional:      Appearance: Normal appearance. He is not ill-appearing.  HENT:     Head: Normocephalic and atraumatic.     Mouth/Throat:     Mouth: Mucous membranes are moist.     Pharynx: Oropharynx is clear. No oropharyngeal exudate or posterior oropharyngeal erythema.   Eyes:     Extraocular Movements: Extraocular movements intact.     Conjunctiva/sclera: Conjunctivae normal.     Pupils: Pupils are equal, round, and reactive to light.    Cardiovascular:     Rate and Rhythm: Normal rate and regular rhythm.     Pulses: Normal pulses.     Heart sounds: Normal heart sounds. No murmur heard. Pulmonary:     Effort: Pulmonary effort is normal. No respiratory distress.     Breath sounds: Normal breath sounds. No wheezing, rhonchi or rales.  Chest:     Chest wall: No tenderness (no reproducible chest tightness).   Skin:    General: Skin is warm and dry.     Findings: No rash.   Neurological:     Mental Status: He is alert.   Psychiatric:        Mood and Affect: Mood normal.        Behavior: Behavior normal.       Results for orders placed or performed in visit on 06/11/23  HM DIABETES EYE EXAM   Collection Time: 06/11/23  1:13 PM  Result Value Ref Range   HM Diabetic Eye Exam No Retinopathy No Retinopathy    Assessment & Plan:   Problem List Items Addressed This Visit     HTN (hypertension)   Chronic stable on current regimen - continue.       Relevant Medications   isosorbide   mononitrate (IMDUR ) 30 MG 24 hr tablet   CAD (coronary artery disease), native coronary artery - Primary   Appreciate cardiology care. Reviewed latest records including left heart catheterization and echocardiogram results. See above-start isosorbide .      Relevant Medications   isosorbide  mononitrate (IMDUR ) 30 MG 24 hr tablet   Chest discomfort   Suspect cardiac source.  Did recently complete reassuring  cardiac evaluation. Has not tried sublingual nitroglycerin  yet. History isosorbide  intolerance-headache, sick feeling Suggest that he try sublingual nitroglycerin  for this chest discomfort and if helpful, may try low-dose isosorbide  50 mg daily sent to pharmacy. Keep cardiology appointment scheduled next month.      Type 2 diabetes mellitus with other specified complication (HCC)   Chronic, update A1c only on low dose Trulicity  Off metformin  due to previously great glycemic control.       Relevant Orders   Hemoglobin A1c   Basic metabolic panel with GFR   Dyslipidemia associated with type 2 diabetes mellitus (HCC)   Update FLP on atorvastatin  40mg  daily . The ASCVD Risk score (Arnett DK, et al., 2019) failed to calculate for the following reasons:   Risk score cannot be calculated because patient has a medical history suggesting prior/existing ASCVD       Relevant Orders   Lipid panel   Amnestic MCI (mild cognitive impairment with memory loss)   Chronic, stable.  Now on vitamin EE regular supplementation. Donepezil  caused headaches Stay off further medications at this time, await neurology evaluation in September.      Low serum vitamin B12   Update levels on daily replacement.       Relevant Orders   Vitamin B12     Meds ordered this encounter  Medications   isosorbide  mononitrate (IMDUR ) 30 MG 24 hr tablet    Sig: Take 0.5 tablets (15 mg total) by mouth daily.    Dispense:  15 tablet    Refill:  1    Orders Placed This Encounter  Procedures   Lipid panel    Vitamin B12   Hemoglobin A1c   Basic metabolic panel with GFR    Patient Instructions  Try nitroglycerin  under the tongue for chest pressure. If this helps, may try very low dose isosorbide  mononitrate (imdur ) 1/2 tablet daily to see if it will help. Update me with effect. Keep cardiology appointment next month.  Stop ibuprofen. Good to see you today  Return in 2-3 months for physical  Follow up plan: Return in about 3 months (around 10/19/2023) for annual exam, prior fasting for blood work, medicare wellness visit.  Claire Crick, MD

## 2023-07-19 NOTE — Patient Instructions (Addendum)
 Try nitroglycerin  under the tongue for chest pressure. If this helps, may try very low dose isosorbide  mononitrate (imdur ) 1/2 tablet daily to see if it will help. Update me with effect. Keep cardiology appointment next month.  Stop ibuprofen. Good to see you today  Return in 2-3 months for physical

## 2023-07-19 NOTE — Addendum Note (Signed)
 Addended by: Donnis Phaneuf on: 07/19/2023 12:32 PM   Modules accepted: Orders

## 2023-07-19 NOTE — Assessment & Plan Note (Signed)
 Chronic stable on current regimen - continue.

## 2023-07-19 NOTE — Assessment & Plan Note (Signed)
 Update FLP on atorvastatin  40mg  daily . The ASCVD Risk score (Arnett DK, et al., 2019) failed to calculate for the following reasons:   Risk score cannot be calculated because patient has a medical history suggesting prior/existing ASCVD

## 2023-07-19 NOTE — Telephone Encounter (Signed)
 Copied from CRM [id number]. Topic: Clinical - Prescription Issue >> Jul 19, 2023 11:08 AM Dorthula Gavel H wrote: Reason for CRM: Pt's wife called in stating that pt's meds should be sent to [facility] [address] [place] [address], instead of [place] [facility]

## 2023-07-19 NOTE — Assessment & Plan Note (Signed)
 Chronic, update A1c only on low dose Trulicity  Off metformin  due to previously great glycemic control.

## 2023-07-19 NOTE — Assessment & Plan Note (Signed)
 Chronic, stable.  Now on vitamin EE regular supplementation. Donepezil  caused headaches Stay off further medications at this time, await neurology evaluation in September.

## 2023-07-19 NOTE — Telephone Encounter (Signed)
 E-scribed rx to [facility].

## 2023-07-22 ENCOUNTER — Other Ambulatory Visit: Payer: Self-pay

## 2023-07-22 ENCOUNTER — Ambulatory Visit (INDEPENDENT_AMBULATORY_CARE_PROVIDER_SITE_OTHER): Payer: Medicare Other

## 2023-07-22 ENCOUNTER — Other Ambulatory Visit: Payer: Self-pay | Admitting: Family Medicine

## 2023-07-22 VITALS — BP 132/86 | Ht 70.0 in | Wt 233.0 lb

## 2023-07-22 DIAGNOSIS — Z0000 Encounter for general adult medical examination without abnormal findings: Secondary | ICD-10-CM

## 2023-07-22 NOTE — Telephone Encounter (Unsigned)
 Copied from CRM [id number]. Topic: Clinical - Medication Refill >> Jul 22, 2023  1:38 PM Henretta I wrote: Medication: gabapentin  (NEURONTIN ) 300 MG capsule  Has the patient contacted their pharmacy? No (Agent: If no, request that the patient contact the pharmacy for the refill. If patient does not wish to contact the pharmacy document the reason why and proceed with request.) (Agent: If yes, when and what did the pharmacy advise?)  This is the patient's preferred pharmacy:  [facility] [address] [place] [address] Phone: [phone] Fax: [phone]   Is this the correct pharmacy for this prescription? Yes If no, delete pharmacy and type the correct one.   Has the prescription been filled recently? No  Is the patient out of the medication? No, patient has only about a week supply left if he takes one a day but he takes 2 a day as prescribed his supply will run out sooner   Has the patient been seen for an appointment in the last year OR does the patient have an upcoming appointment? Yes  Can we respond through MyChart? No  Agent: Please be advised that Rx refills may take up to 3 business days. We ask that you follow-up with your pharmacy.

## 2023-07-22 NOTE — Progress Notes (Signed)
 Because this visit was a virtual/telehealth visit,  certain criteria was not obtained, such a blood pressure, CBG if applicable, and timed get up and go. Any medications not marked as taking were not mentioned during the medication reconciliation part of the visit. Any vitals not documented were not able to be obtained due to this being a telehealth visit or patient was unable to self-report a recent blood pressure reading due to a lack of equipment at home via telehealth. Vitals that have been documented are verbally provided by the patient.   This visit was performed by a medical professional under my direct supervision. I was immediately available for consultation/collaboration. I have reviewed and agree with the Annual Wellness Visit documentation.  Subjective:   Casey Golden is a 73 y.o. who presents for a Medicare Wellness preventive visit.  As a reminder, Annual Wellness Visits don't include a physical exam, and some assessments may be limited, especially if this visit is performed virtually. We may recommend an in-person follow-up visit with your provider if needed.  Visit Complete: Virtual I connected with  Casey Golden on 07/22/23 by a audio enabled telemedicine application and verified that I am speaking with the correct person using two identifiers.  Patient Location: Home  Provider Location: Home Office  I discussed the limitations of evaluation and management by telemedicine. The patient expressed understanding and agreed to proceed.  Vital Signs: Because this visit was a virtual/telehealth visit, some criteria may be missing or patient reported. Any vitals not documented were not able to be obtained and vitals that have been documented are patient reported.  VideoDeclined- This patient declined Librarian, academic. Therefore the visit was completed with audio only.  Persons Participating in Visit: Patient.  AWV Questionnaire: No: Patient  Medicare AWV questionnaire was not completed prior to this visit.  Cardiac Risk Factors include: advanced age (>4men, >3 women);male gender;diabetes mellitus;obesity (BMI >30kg/m2)     Objective:    Today's Vitals   07/22/23 1315  BP: 132/86  Weight: 233 lb (105.7 kg)  Height: 5' 10 (1.778 m)   Body mass index is 33.43 kg/m.     07/22/2023    1:21 PM 04/10/2023    5:30 PM 12/20/2022    8:00 AM 08/12/2022    6:52 PM 08/12/2022    6:35 PM 07/19/2022   10:00 AM 11/28/2021   11:12 AM  Advanced Directives  Does Patient Have a Medical Advance Directive? No No No  No No No  Would patient like information on creating a medical advance directive? No - Patient declined   No - Patient declined  No - Patient declined No - Patient declined    Current Medications (verified) Outpatient Encounter Medications as of 07/22/2023  Medication Sig   Accu-Chek Softclix Lancets lancets Use to check blood glucose 3 times a day   amLODipine  (NORVASC ) 5 MG tablet Take 1 tablet (5 mg total) by mouth daily.   aspirin  81 MG EC tablet Take 1 tablet (81 mg total) by mouth daily.   atorvastatin  (LIPITOR) 40 MG tablet Take 1 tablet (40 mg total) by mouth daily.   Blood Glucose Monitoring Suppl (BLOOD GLUCOSE MONITOR SYSTEM) w/Device KIT Use to check blood sugar 3 (three) times daily.   cyanocobalamin  (VITAMIN B12) 1000 MCG tablet Take 1 tablet (1,000 mcg total) by mouth daily.   diclofenac  sodium (VOLTAREN ) 1 % GEL APPLY 2 G TOPICALLY 3 (THREE) TIMES DAILY AS NEEDED (ANTI INFLAMMATORY).   Dulaglutide  (TRULICITY ) 0.75  MG/0.5ML SOAJ Inject 0.75 mg into the skin once a week.   furosemide  (LASIX ) 20 MG tablet Take 1 tablet (20 mg total) by mouth daily.   gabapentin  (NEURONTIN ) 300 MG capsule Take 1 capsule (300 mg total) by mouth 2 (two) times daily.   glucose blood test strip Use to check blood glucose 3 times a day   isosorbide  mononitrate (IMDUR ) 30 MG 24 hr tablet Take 0.5 tablets (15 mg total) by mouth daily.    Lancets (ONETOUCH DELICA PLUS LANCET33G) MISC Use to check blood glucose 3 (three) times daily.   losartan  (COZAAR ) 100 MG tablet Take 1 tablet (100 mg total) by mouth daily.   nitroGLYCERIN  (NITROSTAT ) 0.4 MG SL tablet Place 1 tablet (0.4 mg total) under the tongue every 5 (five) minutes as needed for chest pain (do not take more than 3 doses.).   vitamin E  1000 UNIT capsule Take 1 capsule (1,000 Units total) by mouth in the morning and at bedtime.   [DISCONTINUED] metoprolol  succinate (TOPROL  XL) 25 MG 24 hr tablet Take 0.5 tablets (12.5 mg total) by mouth daily.   No facility-administered encounter medications on file as of 07/22/2023.    Allergies (verified) Aricept  [donepezil ], Elemental sulfur, Imdur  [isosorbide  nitrate], and Sulfa antibiotics   History: Past Medical History:  Diagnosis Date   Arthritis    CAD (coronary artery disease)    cathx3 with nonobstructive disease.   Cancer [facility])    Cataract 2019   bilateral; resolved with surgery   Colonic polyp    Fatty liver disease, nonalcoholic 2015   by US    GERD (gastroesophageal reflux disease)    History of chicken pox    Hyperlipidemia    Hypertension    Nocturia    OSA (obstructive sleep apnea)    CPAP, compliant   Paget's disease of bony pelvis    Past use of tobacco    Quit 1990, 70 pack year history   Rash of genital area    09-08-2014  per pt Dr Nieves aware   Seasonal and perennial allergic rhinitis    Stroke (HCC)    Type 2 diabetes mellitus (HCC)    Wears dentures    full upper/  partial lower   Wears glasses    Past Surgical History:  Procedure Laterality Date   ABDOMINAL HERNIA REPAIR  2007      [facility]   open repair   APPENDECTOMY  age 50   CARDIAC CATHETERIZATION  12-23-2007   [facility]   Abnormal myoview w/ ischemia/  40% mRCA with nonobstructive and no sig. plaque in his left system, EF 55%   CARDIAC CATHETERIZATION  Apr 2008    [facility]   Abnormal myoview/  50% RCA,  ef 65%   CARDIAC CATHETERIZATION   1999      [facility]   CATARACT EXTRACTION, BILATERAL Bilateral 11/2017   CERVICAL FUSION  1992   CHOLECYSTECTOMY OPEN  2006   COLONOSCOPY WITH PROPOFOL  N/A 04/17/2016   TAs, high grade dysplasia with margins clear, diverticulosis [name], MD)   COLONOSCOPY WITH PROPOFOL  N/A 08/07/2016   TAx1, diverticulosis, rpt 3 yrs ([facility])   COLONOSCOPY WITH PROPOFOL  N/A 01/05/2020   [facility], rpt [name], Darren, MD)   DENTAL SURGERY     metal dental implant L mandible   EXCISION OF SKIN TAG Right 09/14/2014   Procedure: EXCISION OF SKIN TAG;  Surgeon: Donnice Nieves, MD;  Location: [facility];  Service: Urology;  Laterality: Right;   GROIN MASS OPEN  BIOPSY Left 01/17/2019   HYDROCELE EXCISION Left 09/14/2014   Procedure: LEFT HYDROCELECTOMY ADULT;  Surgeon: Donnice Brooks, MD;  Location: [facility];  Service: Urology;  Laterality: Left;   LEFT HEART CATH AND CORONARY ANGIOGRAPHY Left 02/15/2021   Procedure: LEFT HEART CATH AND CORONARY ANGIOGRAPHY;  Surgeon: Mady Bruckner, MD;  Location: [facility] INVASIVE CV LAB;  Service: Cardiovascular;  Laterality: Left;   LEFT HEART CATH AND CORONARY ANGIOGRAPHY Left 05/21/2023   Procedure: LEFT HEART CATH AND CORONARY ANGIOGRAPHY;  Surgeon: Mady Bruckner, MD;  Location: [facility] INVASIVE CV LAB;  Service: Cardiovascular;  Laterality: Left;   MOHS SURGERY  2015   skin cancer   TONSILLECTOMY  age 26   Family History  Problem Relation Age of Onset   CAD Mother        MI   Diabetes Mother    Stroke Mother        mini-stroke   Cancer Father 83       lung (smoker)   Cancer Sister        lung   Heart disease Paternal Grandmother    Heart disease Paternal Grandfather    Diabetes Paternal Grandfather    Coronary artery disease Neg Hx        Premature   Sleep apnea Neg Hx    Social History   Socioeconomic History   Marital status: Married    Spouse name: Not on file   Number of children: 3   Years of education: Not on file    Highest education level: 12th grade  Occupational History   Occupation: Full time    Employer: [employer]  Tobacco Use   Smoking status: Former    Current packs/day: 0.00    Average packs/day: 2.0 packs/day for 35.0 years (70.0 ttl pk-yrs)    Types: Cigarettes    Start date: 01/30/1955    Quit date: 01/29/1990    Years since quitting: 33.4   Smokeless tobacco: Never  Vaping Use   Vaping status: Never Used  Substance and Sexual Activity   Alcohol use: No   Drug use: No   Sexual activity: Yes  Other Topics Concern   Not on file  Social History Narrative   Lives with wife, dog and cats    Occupation: retired, was self employed, now works at [employer]    Edu: HS   Activity: walks 1.5 mi daily   Diet: some water, fruits/vegetables daily   Social Drivers of Corporate investment banker Strain: Low Risk  (07/22/2023)   Overall Financial Resource Strain (CARDIA)    Difficulty of Paying Living Expenses: Not hard at all  Food Insecurity: No Food Insecurity (07/22/2023)   Hunger Vital Sign    Worried About Running Out of Food in the Last Year: Never true    Ran Out of Food in the Last Year: Never true  Transportation Needs: No Transportation Needs (07/22/2023)   PRAPARE - Administrator, Civil Service (Medical): No    Lack of Transportation (Non-Medical): No  Physical Activity: Sufficiently Active (07/22/2023)   Exercise Vital Sign    Days of Exercise per Week: 5 days    Minutes of Exercise per Session: 30 min  Stress: No Stress Concern Present (07/22/2023)   [employer] of Occupational Health - Occupational Stress Questionnaire    Feeling of Stress: Not at all  Social Connections: Moderately Isolated (07/22/2023)   Social Connection and Isolation Panel    Frequency of Communication  with Friends and Family: More than three times a week    Frequency of Social Gatherings with Friends and Family: More than three times a week    Attends Religious Services: Patient  declined    Database administrator or Organizations: No    Attends Engineer, structural: Never    Marital Status: Married    Tobacco Counseling Counseling given: Not Answered    Clinical Intake:  Pre-visit preparation completed: Yes  Pain : No/denies pain     BMI - recorded: 33.43 Nutritional Status: BMI > 30  Obese Nutritional Risks: None Diabetes: Yes CBG done?: No Did pt. bring in CBG monitor from home?: No  Lab Results  Component Value Date   HGBA1C 6.3 07/19/2023   HGBA1C 5.9 (A) 01/28/2023   HGBA1C 5.3 08/12/2022     How often do you need to have someone help you when you read instructions, pamphlets, or other written materials from your doctor or pharmacy?: 1 - Never  Interpreter Needed?: No  Comments: High school Information entered by :: [employer]   Activities of Daily Living     07/22/2023    1:19 PM 05/21/2023    9:04 AM  In your present state of health, do you have any difficulty performing the following activities:  Hearing? 0 0  Vision? 0 0  Difficulty concentrating or making decisions? 0 0  Walking or climbing stairs? 0   Dressing or bathing? 0   Doing errands, shopping? 0   Preparing Food and eating ? N   Using the Toilet? N   In the past six months, have you accidently leaked urine? N   Do you have problems with loss of bowel control? N   Managing your Medications? N   Managing your Finances? N   Housekeeping or managing your Housekeeping? N     Patient Care Team: Rilla Baller, MD as PCP - General (Family Medicine) Perla Evalene PARAS, MD as PCP - Cardiology (Cardiology) Perla Evalene PARAS, MD as Consulting Physician (Cardiology)  I have updated your Care Teams any recent Medical Services you may have received from other providers in the past year.     Assessment:   This is a routine wellness examination for Casey.  Hearing/Vision screen Hearing Screening - Comments:: Patient has some hearing aids   Vision Screening - Comments:: Patient wears glasses    Goals Addressed             This Visit's Progress    Patient Stated   On track    Continue to work and stay active.       Depression Screen     07/22/2023    1:22 PM 07/19/2023    7:35 AM 03/12/2023    8:10 AM 03/06/2023    9:54 AM 08/15/2022    2:19 PM 07/19/2022    9:57 AM 05/24/2022    3:05 PM  PHQ 2/9 Scores  PHQ - 2 Score 0 0  0 0 0 0  PHQ- 9 Score 0     0 0  Exception Documentation   Medical reason        Fall Risk     07/22/2023    1:21 PM 07/19/2023    7:35 AM 07/19/2022    9:52 AM 07/19/2022    8:03 AM 05/24/2022    3:05 PM  Fall Risk   Falls in the past year? 0 0 0 0 0  Number falls in past yr: 0  0  0 0  Injury with Fall? 0  0 0 0  Risk for fall due to : No Fall Risks  No Fall Risks  No Fall Risks  Follow up Falls evaluation completed  Falls prevention discussed;Falls evaluation completed  Falls evaluation completed    MEDICARE RISK AT HOME:  Medicare Risk at Home Any stairs in or around the home?: Yes If so, are there any without handrails?: No Home free of loose throw rugs in walkways, pet beds, electrical cords, etc?: Yes Adequate lighting in your home to reduce risk of falls?: Yes Life alert?: No Use of a cane, walker or w/c?: No Grab bars in the bathroom?: No Shower chair or bench in shower?: No Elevated toilet seat or a handicapped toilet?: No  TIMED UP AND GO:  Was the test performed?  No  Cognitive Function: 6CIT completed    07/06/2020    1:27 PM 05/21/2018   12:31 PM 05/13/2017   10:10 AM  MMSE - Mini Mental State Exam  Orientation to time 5 5 5   Orientation to Place 5 5 5   Registration 3 3 3   Attention/ Calculation 5 0 0  Recall 3 2 2   Recall-comments  unable to recall 1 of 3 words unable to recall 1 of 3 words  Language- name 2 objects  0 0  Language- repeat 1 1 1   Language- follow 3 step command  0 1  Language- follow 3 step command-comments   unable to follow 1 step of 3 step  command  Language- read & follow direction  0 0  Write a sentence  0 0  Copy design  0 0  Total score  16 17        07/22/2023    1:17 PM 07/19/2022   10:04 AM 07/17/2021   10:59 AM  6CIT Screen  What Year? 0 points 0 points 0 points  What month? 0 points 0 points 0 points  What time? 0 points 0 points 0 points  Count back from 20 0 points 0 points 0 points  Months in reverse 0 points 0 points 0 points  Repeat phrase 0 points 0 points 0 points  Total Score 0 points 0 points 0 points    Immunizations Immunization History  Administered Date(s) Administered   Influenza, High Dose Seasonal PF 11/30/2013, 09/15/2018, 09/03/2019, 10/26/2020, 10/30/2021, 09/28/2022   Influenza,inj,Quad PF,6+ Mos 10/15/2012, 09/30/2015, 09/06/2017   Influenza,inj,quad, With Preservative 10/29/2016   Influenza-Unspecified 11/01/2014, 10/13/2016   PFIZER Comirnaty(Gray Top)Covid-19 Tri-Sucrose Vaccine 07/08/2020   PFIZER(Purple Top)SARS-COV-2 Vaccination 04/02/2019, 04/23/2019, 11/29/2019   PNEUMOCOCCAL CONJUGATE-20 09/09/2020   Pfizer Covid-19 Vaccine Bivalent Booster 61yrs & up 02/09/2021   Pfizer(Comirnaty)Fall Seasonal Vaccine 12 years and older 11/01/2021, 09/28/2022   Pneumococcal Conjugate-13 09/05/2015   Pneumococcal Polysaccharide-23 01/30/2011, 10/18/2016, 04/18/2018   Respiratory Syncytial Virus Vaccine,Recomb Aduvanted(Arexvy) 10/30/2021   Td 10/27/2015   Tdap 10/27/2015, 06/12/2018   Zoster Recombinant(Shingrix) 09/13/2017, 12/15/2017   Zoster, Live 09/15/2012    Screening Tests Health Maintenance  Topic Date Due   Diabetic kidney evaluation - Urine ACR  07/15/2022   FOOT EXAM  01/09/2023   COVID-19 Vaccine (8 - Pfizer risk 2024-25 season) 03/15/2024 (Originally 03/29/2023)   INFLUENZA VACCINE  08/30/2023   HEMOGLOBIN A1C  01/18/2024   OPHTHALMOLOGY EXAM  06/10/2024   Diabetic kidney evaluation - eGFR measurement  07/18/2024   Medicare Annual Wellness (AWV)  07/21/2024    Colonoscopy  01/04/2025   DTaP/Tdap/Td (4 - Td or Tdap) 06/11/2028   Pneumococcal  Vaccine: 50+ Years  Completed   Hepatitis C Screening  Completed   Zoster Vaccines- Shingrix  Completed   HPV VACCINES  Aged Out   Meningococcal B Vaccine  Aged Out    Health Maintenance  Health Maintenance Due  Topic Date Due   Diabetic kidney evaluation - Urine ACR  07/15/2022   FOOT EXAM  01/09/2023   Health Maintenance Items Addressed:   Additional Screening:  Vision Screening: Recommended annual ophthalmology exams for early detection of glaucoma and other disorders of the eye. Would you like a referral to an eye doctor? No    Dental Screening: Recommended annual dental exams for proper oral hygiene  Community Resource Referral / Chronic Care Management: CRR required this visit?  No   CCM required this visit?  No   Plan:    I have personally reviewed and noted the following in the patient's chart:   Medical and social history Use of alcohol, tobacco or illicit drugs  Current medications and supplements including opioid prescriptions. Patient is not currently taking opioid prescriptions. Functional ability and status Nutritional status Physical activity Advanced directives List of other physicians Hospitalizations, surgeries, and ER visits in previous 12 months Vitals Screenings to include cognitive, depression, and falls Referrals and appointments  In addition, I have reviewed and discussed with patient certain preventive protocols, quality metrics, and best practice recommendations. A written personalized care plan for preventive services as well as general preventive health recommendations were provided to patient.   Lyle MARLA Right, [place]   07/22/2023   After Visit Summary: (MyChart) Due to this being a telephonic visit, the after visit summary with patients personalized plan was offered to patient via MyChart   Notes: Nothing significant to report at this time.

## 2023-07-22 NOTE — Patient Instructions (Signed)
 [name] , Thank you for taking time out of your busy schedule to complete your Annual Wellness Visit with me. I enjoyed our conversation and look forward to speaking with you again next year. I, as well as your care team,  appreciate your ongoing commitment to your health goals. Please review the following plan we discussed and let me know if I can assist you in the future. Your Game plan/ To Do List    Referrals: If you haven't heard from the office you've been referred to, please reach out to them at the phone provided.   Follow up Visits: Next Medicare AWV with our clinical staff: 07/24/2023   Have you seen your provider in the last 6 months (3 months if uncontrolled diabetes)? No Next Office Visit with your provider: 10/21/2023  Clinician Recommendations:  Aim for 30 minutes of exercise or brisk walking, 6-8 glasses of water, and 5 servings of fruits and vegetables each day.       This is a list of the screening recommended for you and due dates:  Health Maintenance  Topic Date Due   Yearly kidney health urinalysis for diabetes  07/15/2022   Complete foot exam   01/09/2023   COVID-19 Vaccine (8 - Pfizer risk 2024-25 season) 03/15/2024*   Flu Shot  08/30/2023   Hemoglobin A1C  01/18/2024   Eye exam for diabetics  06/10/2024   Yearly kidney function blood test for diabetes  07/18/2024   Medicare Annual Wellness Visit  07/21/2024   Colon Cancer Screening  01/04/2025   DTaP/Tdap/Td vaccine (4 - Td or Tdap) 06/11/2028   Pneumococcal Vaccine for age over 72  Completed   Hepatitis C Screening  Completed   Zoster (Shingles) Vaccine  Completed   HPV Vaccine  Aged Out   Meningitis B Vaccine  Aged Out  *Topic was postponed. The date shown is not the original due date.    Advanced directives: (Declined) Advance directive discussed with you today. Even though you declined this today, please call our office should you change your mind, and we can give you the proper paperwork for you to fill  out. Advance Care Planning is important because it:  [x]  Makes sure you receive the medical care that is consistent with your values, goals, and preferences  [x]  It provides guidance to your family and loved ones and reduces their decisional burden about whether or not they are making the right decisions based on your wishes.  Follow the link provided in your after visit summary or read over the paperwork we have mailed to you to help you started getting your Advance Directives in place. If you need assistance in completing these, please reach out to us  so that we can help you!  See attachments for Preventive Care and Fall Prevention Tips.

## 2023-07-23 ENCOUNTER — Other Ambulatory Visit: Payer: Self-pay

## 2023-07-23 NOTE — Telephone Encounter (Signed)
 Gabapentin  rx sent 01/29/23, #180/3 refills to [facility] pharmacy. Should have refills available.  Spoke with [name], of [facility] pharmacy, asking about pt's rx. States pt has refills available but too early for next. Should be able to at end of July.   Spoke with pt relaying info from pharmacy. Pt states he accidentally through out his pills from last fill. I suggested he speak with [facility] pharmacy to let them know that and they will probably give him enough until the end of July but he may have to pay out of pocket. Pt verbalizes understanding and expresses his thanks.   Unpinned rx.

## 2023-07-25 ENCOUNTER — Ambulatory Visit: Admitting: Neurology

## 2023-07-25 ENCOUNTER — Ambulatory Visit: Payer: Self-pay | Admitting: Family Medicine

## 2023-07-25 MED ORDER — VITAMIN B-12 1000 MCG PO TABS: 1000.0000 ug | ORAL_TABLET | ORAL | Status: AC

## 2023-07-26 ENCOUNTER — Other Ambulatory Visit

## 2023-08-07 ENCOUNTER — Telehealth: Payer: Self-pay

## 2023-08-07 NOTE — Telephone Encounter (Signed)
 Spoke with pt to get clarification of message. Pt states he has stopped taking gabapentin  and spoke with Dr KANDICE about it. Says he doesn't need any labs at this time.   Updated pt's med list.

## 2023-08-07 NOTE — Telephone Encounter (Signed)
 Patient stated he is not going to pick up the medication and to not order it again.  Patient verbalized to do a blood test

## 2023-08-09 ENCOUNTER — Telehealth: Payer: Self-pay | Admitting: Family Medicine

## 2023-08-09 NOTE — Telephone Encounter (Signed)
 Copied from CRM [id number]. Topic: Clinical - Prescription Issue >> Aug 09, 2023 10:37 AM Jasmin G wrote: Reason for CRM: Patient received a call from Goins, Felicia, CMA a few days ago. He states that he does not want to take a certain medication and was told that he needs to call the Dr. Hellen to inform him about the matter. He request that he gets a call back from Kenvir, Lipan, CMA as he got a missed call from her, to get Dr's name so he can resolve the matter.

## 2023-08-09 NOTE — Telephone Encounter (Addendum)
 Spoke with pt returning his call. Pt just wanted to get phn # for [facility] pharmacy. I provided phn # [phone]. Pt expresses his thanks.

## 2023-08-12 ENCOUNTER — Ambulatory Visit: Attending: Physician Assistant | Admitting: Physician Assistant

## 2023-08-12 ENCOUNTER — Other Ambulatory Visit: Payer: Self-pay

## 2023-08-12 ENCOUNTER — Encounter: Payer: Self-pay | Admitting: Physician Assistant

## 2023-08-12 VITALS — BP 120/70 | HR 89 | Ht 67.0 in | Wt 229.6 lb

## 2023-08-12 DIAGNOSIS — E785 Hyperlipidemia, unspecified: Secondary | ICD-10-CM

## 2023-08-12 DIAGNOSIS — I2510 Atherosclerotic heart disease of native coronary artery without angina pectoris: Secondary | ICD-10-CM | POA: Diagnosis not present

## 2023-08-12 DIAGNOSIS — Z79899 Other long term (current) drug therapy: Secondary | ICD-10-CM

## 2023-08-12 DIAGNOSIS — I5189 Other ill-defined heart diseases: Secondary | ICD-10-CM | POA: Diagnosis not present

## 2023-08-12 DIAGNOSIS — I10 Essential (primary) hypertension: Secondary | ICD-10-CM

## 2023-08-12 MED ORDER — FUROSEMIDE 20 MG PO TABS
20.0000 mg | ORAL_TABLET | Freq: Every day | ORAL | 5 refills | Status: DC
  Filled 2023-09-20: qty 30, 30d supply, fill #0
  Filled 2023-10-13: qty 30, 30d supply, fill #1
  Filled 2023-11-09: qty 30, 30d supply, fill #2
  Filled 2023-12-04: qty 30, 30d supply, fill #3
  Filled 2024-01-15: qty 30, 30d supply, fill #4
  Filled 2024-02-18: qty 30, 30d supply, fill #5

## 2023-08-12 MED FILL — Dulaglutide Soln Auto-injector 0.75 MG/0.5ML: SUBCUTANEOUS | 28 days supply | Qty: 2 | Fill #1 | Status: AC

## 2023-08-12 NOTE — Progress Notes (Signed)
 Cardiology Office Note    Date:  08/12/2023   ID:  Casey Golden, DOB 1950-05-08, MRN [id number]  PCP:  Rilla Baller, MD  Cardiologist:  Evalene Lunger, MD  Electrophysiologist:  None   Chief Complaint: Follow-up  History of Present Illness:   Casey Golden is a 73 y.o. male with history of nonobstructive CAD by LHC in 01/2021 and 04/2023, diastolic dysfunction, DM2, HTN, HLD, syncope, prior tobacco use quitting in 1990, chronic low back pain, osteoarthritis, and extramammary Paget's disease of the scrotum who presents for follow-up of CAD.   Longstanding history of nonobstructive CAD with positive nuclear stress test leading to cath 04/2006 showing normal LVEF, 50% mid RCA disease, and 30% LAD disease. Cath 11/2007 with 40% mid RCA disease, normal LVEF. Multiple ED visits in late 2022/early 2023 for recurrent chest pain.  Most recent LHC in 01/2021 showed mild to moderate, nonobstructive CAD including long segment of mid LAD stenosis of up to 30 to 40% with 20% ostial D2 stenosis, and 20 to 30% proximal RCA stenosis.  Hyperdynamic LV systolic function with mildly elevated LVEDP.  Medical therapy was recommended.  He was seen in the ED in 03/2023 with chest discomfort provoked by walking in his yard.  High-sensitivity troponin negative x 2.  Chest x-ray without acute cardiopulmonary process.  EKG without acute ischemic changes.  He followed up in the office on 04/16/2023 and reported intermittent episodes of centrally located chest pressure.  Coronary CTA was recommended.  However, before this could be undertaken he was seen in the office with continued intermittent chest pain concerning for angina that was occurring more frequently and more intense.  In this setting he underwent LHC in 04/2023 that showed mild to moderate, nonobstructive CAD that was not significantly changed from cath in 01/2021.  Moderately elevated LVEDP estimated at 25 mmHg.  Echo in 04/2023 showed an EF of 60 to 65%, no  regional wall motion normalities, grade 1 diastolic dysfunction, normal RV systolic function and ventricular cavity size, no significant valvular abnormalities, and an estimated right atrial pressure of 3 mmHg.  He comes in today and is without symptoms of angina or cardiac decompensation.  No dizziness, presyncope, or syncope.  He has several noncardiac complaints at this time including low back pain, pain along the 3rd and 4th digits of his right hand, and left-sided facial pain.  He indicates these pains came about after self discontinuing gabapentin  which he indicates he will no longer take.  He has also self discontinued Aricept  and Imdur  indicating the latter causes death.  His weight is down 2 pounds today when compared to his last clinic visit.  No progressive orthopnea or lower extremity swelling.   Labs independently reviewed: 06/2023 - potassium 4.6, BUN 22, serum creatinine 0.78, A1c 6.3, TC 94, TG 111, HDL 32, LDL 39 04/2023 - Hgb 16.4, PLT 234 11/2022 - TSH normal, albumin 4.3, AST/ALT normal   Past Medical History:  Diagnosis Date   Arthritis    CAD (coronary artery disease)    cathx3 with nonobstructive disease.   Cancer [facility])    Cataract 2019   bilateral; resolved with surgery   Colonic polyp    Fatty liver disease, nonalcoholic 2015   by US    GERD (gastroesophageal reflux disease)    History of chicken pox    Hyperlipidemia    Hypertension    Nocturia    OSA (obstructive sleep apnea)    CPAP, compliant   Paget's disease of  bony pelvis    Past use of tobacco    Quit 1990, 70 pack year history   Rash of genital area    09-08-2014  per pt Dr Nieves aware   Seasonal and perennial allergic rhinitis    Stroke (HCC)    Type 2 diabetes mellitus (HCC)    Wears dentures    full upper/  partial lower   Wears glasses     Past Surgical History:  Procedure Laterality Date   ABDOMINAL HERNIA REPAIR  2007      [facility]   open repair   APPENDECTOMY  age 89   CARDIAC  CATHETERIZATION  12-23-2007   [facility]   Abnormal myoview w/ ischemia/  40% mRCA with nonobstructive and no sig. plaque in his left system, EF 55%   CARDIAC CATHETERIZATION  Apr 2008    [facility]   Abnormal myoview/  50% RCA,  ef 65%   CARDIAC CATHETERIZATION  1999      [facility]   CATARACT EXTRACTION, BILATERAL Bilateral 11/2017   CERVICAL FUSION  1992   CHOLECYSTECTOMY OPEN  2006   COLONOSCOPY WITH PROPOFOL  N/A 04/17/2016   TAs, high grade dysplasia with margins clear, diverticulosis [name], MD)   COLONOSCOPY WITH PROPOFOL  N/A 08/07/2016   TAx1, diverticulosis, rpt 3 yrs ([facility])   COLONOSCOPY WITH PROPOFOL  N/A 01/05/2020   SSP, rpt [name], Darren, MD)   DENTAL SURGERY     metal dental implant L mandible   EXCISION OF SKIN TAG Right 09/14/2014   Procedure: EXCISION OF SKIN TAG;  Surgeon: Donnice Nieves, MD;  Location: [facility];  Service: Urology;  Laterality: Right;   GROIN MASS OPEN BIOPSY Left 01/17/2019   HYDROCELE EXCISION Left 09/14/2014   Procedure: LEFT HYDROCELECTOMY ADULT;  Surgeon: Donnice Nieves, MD;  Location: [facility];  Service: Urology;  Laterality: Left;   LEFT HEART CATH AND CORONARY ANGIOGRAPHY Left 02/15/2021   Procedure: LEFT HEART CATH AND CORONARY ANGIOGRAPHY;  Surgeon: Mady Bruckner, MD;  Location: [facility];  Service: Cardiovascular;  Laterality: Left;   LEFT HEART CATH AND CORONARY ANGIOGRAPHY Left 05/21/2023   Procedure: LEFT HEART CATH AND CORONARY ANGIOGRAPHY;  Surgeon: Mady Bruckner, MD;  Location: [facility];  Service: Cardiovascular;  Laterality: Left;   MOHS SURGERY  2015   skin cancer   TONSILLECTOMY  age 63    Current Medications: Current Meds  Medication Sig   Accu-Chek Softclix Lancets lancets Use to check blood glucose 3 times a day   amLODipine  (NORVASC ) 5 MG tablet Take 1 tablet (5 mg total) by mouth daily.   aspirin  81 MG EC tablet Take 1 tablet (81 mg total) by mouth daily.   atorvastatin   (LIPITOR) 40 MG tablet Take 1 tablet (40 mg total) by mouth daily.   Blood Glucose Monitoring Suppl (BLOOD GLUCOSE MONITOR SYSTEM) w/Device KIT Use to check blood sugar 3 (three) times daily.   cyanocobalamin  (VITAMIN B12) 1000 MCG tablet Take 1 tablet (1,000 mcg total) by mouth every Monday, Wednesday, and Friday.   diclofenac  sodium (VOLTAREN ) 1 % GEL APPLY 2 G TOPICALLY 3 (THREE) TIMES DAILY AS NEEDED (ANTI INFLAMMATORY).   Dulaglutide  (TRULICITY ) 0.75 MG/0.5ML SOAJ Inject 0.75 mg into the skin once a week.   glucose blood test strip Use to check blood glucose 3 times a day   Lancets (ONETOUCH DELICA PLUS LANCET33G) MISC Use to check blood glucose 3 (three) times daily.   losartan  (COZAAR ) 100 MG tablet Take 1  tablet (100 mg total) by mouth daily.   nitroGLYCERIN  (NITROSTAT ) 0.4 MG SL tablet Place 1 tablet (0.4 mg total) under the tongue every 5 (five) minutes as needed for chest pain (do not take more than 3 doses.).   vitamin E  1000 UNIT capsule Take 1 capsule (1,000 Units total) by mouth in the morning and at bedtime.   [DISCONTINUED] furosemide  (LASIX ) 20 MG tablet Take 1 tablet (20 mg total) by mouth daily.   [DISCONTINUED] isosorbide  mononitrate (IMDUR ) 30 MG 24 hr tablet Take 0.5 tablets (15 mg total) by mouth daily.    Allergies:   Aricept  [donepezil ], Elemental sulfur, Imdur  [isosorbide  nitrate], and Sulfa antibiotics   Social History   Socioeconomic History   Marital status: Married    Spouse name: Not on file   Number of children: 3   Years of education: Not on file   Highest education level: 12th grade  Occupational History   Occupation: Full time    Employer: [employer]  Tobacco Use   Smoking status: Former    Current packs/day: 0.00    Average packs/day: 2.0 packs/day for 35.0 years (70.0 ttl pk-yrs)    Types: Cigarettes    Start date: 01/30/1955    Quit date: 01/29/1990    Years since quitting: 33.5   Smokeless tobacco: Never  Vaping Use   Vaping status:  Never Used  Substance and Sexual Activity   Alcohol use: No   Drug use: No   Sexual activity: Yes  Other Topics Concern   Not on file  Social History Narrative   Lives with wife, dog and cats    Occupation: retired, was self employed, now works at [employer]    Edu: HS   Activity: walks 1.5 mi daily   Diet: some water, fruits/vegetables daily   Social Drivers of Corporate investment banker Strain: Low Risk  (07/22/2023)   Overall Financial Resource Strain (CARDIA)    Difficulty of Paying Living Expenses: Not hard at all  Food Insecurity: No Food Insecurity (07/22/2023)   Hunger Vital Sign    Worried About Running Out of Food in the Last Year: Never true    Ran Out of Food in the Last Year: Never true  Transportation Needs: No Transportation Needs (07/22/2023)   PRAPARE - Administrator, Civil Service (Medical): No    Lack of Transportation (Non-Medical): No  Physical Activity: Sufficiently Active (07/22/2023)   Exercise Vital Sign    Days of Exercise per Week: 5 days    Minutes of Exercise per Session: 30 min  Stress: No Stress Concern Present (07/22/2023)   [employer] of Occupational Health - Occupational Stress Questionnaire    Feeling of Stress: Not at all  Social Connections: Moderately Isolated (07/22/2023)   Social Connection and Isolation Panel    Frequency of Communication with Friends and Family: More than three times a week    Frequency of Social Gatherings with Friends and Family: More than three times a week    Attends Religious Services: Patient declined    Database administrator or Organizations: No    Attends Engineer, structural: Never    Marital Status: Married     Family History:  The patient's family history includes CAD in his mother; Cancer in his sister; Cancer (age of onset: 17) in his father; Diabetes in his mother and paternal grandfather; Heart disease in his paternal grandfather and paternal grandmother; Stroke in his  mother. There is no  history of Coronary artery disease or Sleep apnea.  ROS:   12-point review of systems is negative unless otherwise noted in the HPI.   EKGs/Labs/Other Studies Reviewed:    Studies reviewed were summarized above. The additional studies were reviewed today:  LHC 02/15/2021:   2nd Diag lesion is 20% stenosed.   Mid LAD to Dist LAD lesion is 35% stenosed.   Prox RCA lesion is 25% stenosed.   There is hyperdynamic left ventricular systolic function.   LV end diastolic pressure is mildly elevated.   The left ventricular ejection fraction is greater than 65% by visual estimate.   There is no aortic valve stenosis.   Conclusions: Mild-moderate, non-obstructive coronary artery disease, including long segment of mid LAD stenosis of up to 30-40%, 20% ostial D2 stenosis, and 20-30% proximal RCA lesion. Hyperdynamic left ventricular systolic function with mildly elevated filling pressure (LVEDP ~20 mmHg).   Recommendations: No significant coronary lesion to explain accelerating angina.  Will add isosorbide  mononitrate 15 mg daily. Medical therapy and risk factor modification to prevent progression of disease. __________  [facility] 05/21/2023: Conclusions: Mild-moderate, nonobstructive coronary artery disease, as detailed below.  No significant change from prior catheterization in 01/2021. Moderately elevated left ventricular filling pressure (LVEDP 25 mmHg).   Recommendations: Initiate furosemide  20 mg daily with BMP at follow-up. If chest pain persists and/or patient has continued hypertension, addition of amlodipine  should be considered for blood pressure control and antianginal therapy. Obtain echocardiogram. __________  2D echo 05/23/2023: 1. Left ventricular ejection fraction, by estimation, is 60 to 65%. Left  ventricular ejection fraction by PLAX is 64 %. The left ventricle has  normal function. The left ventricle has no regional wall motion  abnormalities. Left  ventricular diastolic  parameters are consistent with Grade I diastolic dysfunction (impaired  relaxation).   2. Right ventricular systolic function is normal. The right ventricular  size is normal.   3. The mitral valve is normal in structure. No evidence of mitral valve  regurgitation. No evidence of mitral stenosis.   4. The aortic valve is normal in structure. Aortic valve regurgitation is  not visualized. Aortic valve sclerosis is present, with no evidence of  aortic valve stenosis.   5. The inferior vena cava is normal in size with greater than 50%  respiratory variability, suggesting right atrial pressure of 3 mmHg.     EKG:  EKG is not ordered today.   Recent Labs: 12/20/2022: ALT 35 12/24/2022: TSH 0.91 05/20/2023: Hemoglobin 16.4; Platelets 234 07/19/2023: BUN 22; Creatinine, Ser 0.78; Potassium 4.6; Sodium 139  Recent Lipid Panel    Component Value Date/Time   CHOL 94 07/19/2023 0754   CHOL 179 07/21/2014 0935   TRIG 111.0 07/19/2023 0754   TRIG 192 10/03/2007 0000   HDL 32.70 (L) 07/19/2023 0754   HDL 31 (L) 07/21/2014 0935   CHOLHDL 3 07/19/2023 0754   VLDL 22.2 07/19/2023 0754   LDLCALC 39 07/19/2023 0754   LDLCALC 74 07/21/2014 0935   LDLDIRECT 54.0 02/13/2021 0852    PHYSICAL EXAM:    VS:  BP 120/70 (BP Location: Left Arm, Patient Position: Sitting, Cuff Size: Normal)   Pulse 89   Ht 5' 7 (1.702 m)   Wt 229 lb 9.6 oz (104.1 kg)   SpO2 97%   BMI 35.96 kg/m   BMI: Body mass index is 35.96 kg/m.  Physical Exam Vitals reviewed.  Constitutional:      Appearance: He is well-developed.  HENT:     Head:  Normocephalic and atraumatic.  Eyes:     General:        Right eye: No discharge.        Left eye: No discharge.  Cardiovascular:     Rate and Rhythm: Normal rate and regular rhythm.     Heart sounds: Normal heart sounds, S1 normal and S2 normal. Heart sounds not distant. No midsystolic click and no opening snap. No murmur heard.    No friction rub.   Pulmonary:     Effort: Pulmonary effort is normal. No respiratory distress.     Breath sounds: Normal breath sounds. No decreased breath sounds, wheezing, rhonchi or rales.  Chest:     Chest wall: No tenderness.  Musculoskeletal:     Cervical back: Normal range of motion.     Right lower leg: No edema.     Left lower leg: No edema.  Skin:    General: Skin is warm and dry.     Nails: There is no clubbing.  Neurological:     Mental Status: He is alert and oriented to person, place, and time.  Psychiatric:        Speech: Speech normal.        Behavior: Behavior normal.        Thought Content: Thought content normal.        Judgment: Judgment normal.     Wt Readings from Last 3 Encounters:  08/12/23 229 lb 9.6 oz (104.1 kg)  07/22/23 233 lb (105.7 kg)  07/19/23 233 lb 8 oz (105.9 kg)     ASSESSMENT & PLAN:   CAD involving the native coronary arteries without angina: He is without symptoms of angina or cardiac decompensation.  Continue aggressive risk factor modification and primary prevention including aspirin  81 mg and atorvastatin  40 mg.  No indication for further ischemic testing at this time.  Diastolic dysfunction: Remains on furosemide  20 mg daily.  Check BMP.  HTN: Blood pressure is well-controlled in the office today.  He remains on amlodipine  5 mg and losartan  100 mg.  HLD: LDL 39 in 06/2023 with normal AST/ALT in 11/2022.  Continue atorvastatin  40 mg.     Disposition: F/u with Dr. Gollan or an APP in 6 months.   Medication Adjustments/Labs and Tests Ordered: Current medicines are reviewed at length with the patient today.  Concerns regarding medicines are outlined above. Medication changes, Labs and Tests ordered today are summarized above and listed in the Patient Instructions accessible in Encounters.   Signed, Bernardino Bring, PA-C 08/12/2023 12:37 PM     [facility] [place] [address] [place], [place] [address] [phone]

## 2023-08-12 NOTE — Patient Instructions (Addendum)
 Medication Instructions:  Your physician recommends the following medication changes.  STOP TAKING: Imdur   *If you need a refill on your cardiac medications before your next appointment, please call your pharmacy*  Lab Work: Your provider would like for you to have following labs drawn today BMeT.   If you have labs (blood work) drawn today and your tests are completely normal, you will receive your results only by: MyChart Message (if you have MyChart) OR A paper copy in the mail If you have any lab test that is abnormal or we need to change your treatment, we will call you to review the results.  Testing/Procedures: None ordered at this time   Follow-Up: At [facility], you and your health needs are our priority.  As part of our continuing mission to provide you with exceptional heart care, our providers are all part of one team.  This team includes your primary Cardiologist (physician) and Advanced Practice Providers or APPs (Physician Assistants and Nurse Practitioners) who all work together to provide you with the care you need, when you need it.  Your next appointment:   6 month(s)  Provider:   You may see Timothy Gollan, MD or Bernardino Bring, PA-C  This RN called [facility] pharmacy to cancel gabapentin  prescription as requested by Mr Behrle.

## 2023-08-13 ENCOUNTER — Ambulatory Visit: Payer: Self-pay | Admitting: Physician Assistant

## 2023-08-13 LAB — BASIC METABOLIC PANEL WITH GFR
BUN/Creatinine Ratio: 24 (ref 10–24)
BUN: 20 mg/dL (ref 8–27)
CO2: 20 mmol/L (ref 20–29)
Calcium: 9.5 mg/dL (ref 8.6–10.2)
Chloride: 101 mmol/L (ref 96–106)
Creatinine, Ser: 0.85 mg/dL (ref 0.76–1.27)
Glucose: 105 mg/dL — ABNORMAL HIGH (ref 70–99)
Potassium: 4.5 mmol/L (ref 3.5–5.2)
Sodium: 138 mmol/L (ref 134–144)
eGFR: 91 mL/min/1.73 (ref >59)

## 2023-08-14 ENCOUNTER — Other Ambulatory Visit: Payer: Self-pay

## 2023-08-28 ENCOUNTER — Other Ambulatory Visit: Payer: Self-pay

## 2023-08-31 ENCOUNTER — Other Ambulatory Visit: Payer: Self-pay

## 2023-09-02 MED FILL — Dulaglutide Soln Auto-injector 0.75 MG/0.5ML: SUBCUTANEOUS | 28 days supply | Qty: 2 | Fill #2 | Status: AC

## 2023-09-03 ENCOUNTER — Other Ambulatory Visit: Payer: Self-pay

## 2023-09-04 ENCOUNTER — Ambulatory Visit (INDEPENDENT_AMBULATORY_CARE_PROVIDER_SITE_OTHER): Admitting: Family Medicine

## 2023-09-04 ENCOUNTER — Encounter: Payer: Self-pay | Admitting: Family Medicine

## 2023-09-04 ENCOUNTER — Other Ambulatory Visit: Payer: Self-pay

## 2023-09-04 VITALS — BP 138/72 | HR 72 | Temp 98.6°F | Ht 67.0 in | Wt 231.2 lb

## 2023-09-04 DIAGNOSIS — M25561 Pain in right knee: Secondary | ICD-10-CM

## 2023-09-04 DIAGNOSIS — G8929 Other chronic pain: Secondary | ICD-10-CM | POA: Diagnosis not present

## 2023-09-04 DIAGNOSIS — G3184 Mild cognitive impairment, so stated: Secondary | ICD-10-CM

## 2023-09-04 DIAGNOSIS — R519 Headache, unspecified: Secondary | ICD-10-CM

## 2023-09-04 DIAGNOSIS — M545 Low back pain, unspecified: Secondary | ICD-10-CM | POA: Diagnosis not present

## 2023-09-04 MED ORDER — GABAPENTIN 300 MG PO CAPS
300.0000 mg | ORAL_CAPSULE | Freq: Every day | ORAL | 3 refills | Status: DC
  Filled 2023-09-04: qty 30, 30d supply, fill #0
  Filled 2023-09-27: qty 30, 30d supply, fill #1
  Filled 2023-10-27: qty 30, 30d supply, fill #2
  Filled 2023-11-26: qty 30, 30d supply, fill #3

## 2023-09-04 MED ORDER — MULTIVITAMIN ADULT PO TABS
1.0000 | ORAL_TABLET | Freq: Every day | ORAL | Status: AC
Start: 2023-09-04 — End: ?

## 2023-09-04 NOTE — Progress Notes (Unsigned)
 Ph: [phone] Fax: [phone]   Patient ID: Casey Golden, male    DOB: [birth year], 73 y.o.   MRN: [id number]  This visit was conducted in person.  BP 138/72   Pulse 72   Temp 98.6 F (37 C) (Oral)   Ht 5' 7 (1.702 m)   Wt 231 lb 4 oz (104.9 kg)   SpO2 99%   BMI 36.22 kg/m    CC: L facial swelling , R leg swelling  Subjective:   HPI: Casey Golden is a 73 y.o. male presenting on 09/04/2023 for Facial Swelling (C/o L-side facial swelling. Started 6-8 wks ago after stopping gabapentin . Also, c/o L leg swelling.         )   See prior notes for details.  Dx MCI with memory loss 11/2022 - aricept  caused headaches. Continues vit E 1000 units bid. Low b12 - on replacement, levels high so now on MWF replacement.  Upcoming neurology appt 09/2023.   Brain MRI 11/2022 showed mild chronic microvascular ischemic changes with prominence of supratentorial ventricular system suggestive of central parenchymal volume loss.   Developed chest pain 04/2023, evaluated at ER with subsequent cardiology evaluation concerning for unstable angina s/p L heart catheterization completed 05/21/2023 showing mild-mod non-obstructive CAD - started on furosemide  20mg  daily in addition to amlodipine  5mg  daily. Was recommended isosorbide . He self stopped isosorbide  mononitrate 15mg  - and states he doesn't want to take it  may cause death was on warning label.   Subsequently stopped gabapentin  300mg  BID - thought I was concerned about him taking this medication. Since stopping, he's noted L facial swelling and throbbing pain, worse at night time. Has also noted R knee pain with lower leg swelling and pain as well as R low back pain. Was on gabapentin  for chronic lumbar back pain. No recent long car or plane rides. MRI 2017: severe spinal stenosis predominant at L4/5 with severe R and mod L subarticular zone narrowing, moderate R neural foraminal narrowing with impingement on R L4 nerve root and severe R facet  arthrosis, severe L3/4 spinal stenosis, mod-sever L2/3 spinal stenosis.   No skin changes to face.  No jaw claudication.  No left ear pain or temple pain.  No fevers/chills No tinnitus or hearing changes.   HOH - he uses hearing aide by [employer].      Relevant past medical, surgical, family and social history reviewed and updated as indicated. Interim medical history since our last visit reviewed. Allergies and medications reviewed and updated. Outpatient Medications Prior to Visit  Medication Sig Dispense Refill   Accu-Chek Softclix Lancets lancets Use to check blood glucose 3 times a day 100 each 0   amLODipine  (NORVASC ) 5 MG tablet Take 1 tablet (5 mg total) by mouth daily. 180 tablet 3   aspirin  81 MG EC tablet Take 1 tablet (81 mg total) by mouth daily. 30 tablet 12   atorvastatin  (LIPITOR) 40 MG tablet Take 1 tablet (40 mg total) by mouth daily. 90 tablet 3   Blood Glucose Monitoring Suppl (BLOOD GLUCOSE MONITOR SYSTEM) w/Device KIT Use to check blood sugar 3 (three) times daily. 1 kit 0   cyanocobalamin  (VITAMIN B12) 1000 MCG tablet Take 1 tablet (1,000 mcg total) by mouth every Monday, Wednesday, and Friday.     diclofenac  sodium (VOLTAREN ) 1 % GEL APPLY 2 G TOPICALLY 3 (THREE) TIMES DAILY AS NEEDED (ANTI INFLAMMATORY). 100 g 1   Dulaglutide  (TRULICITY ) 0.75 MG/0.5ML SOAJ Inject 0.75 mg into  the skin once a week. 2 mL 6   furosemide  (LASIX ) 20 MG tablet Take 1 tablet (20 mg total) by mouth daily. 30 tablet 5   glucose blood test strip Use to check blood glucose 3 times a day 100 each 0   Lancets (ONETOUCH DELICA PLUS LANCET33G) MISC Use to check blood glucose 3 (three) times daily. 100 each 0   losartan  (COZAAR ) 100 MG tablet Take 1 tablet (100 mg total) by mouth daily. 90 tablet 3   nitroGLYCERIN  (NITROSTAT ) 0.4 MG SL tablet Place 1 tablet (0.4 mg total) under the tongue every 5 (five) minutes as needed for chest pain (do not take more than 3 doses.). 50 tablet 0   vitamin E   1000 UNIT capsule Take 1 capsule (1,000 Units total) by mouth in the morning and at bedtime.     metoprolol  succinate (TOPROL  XL) 25 MG 24 hr tablet Take 0.5 tablets (12.5 mg total) by mouth daily. 90 tablet 3   No facility-administered medications prior to visit.     Per HPI unless specifically indicated in ROS section below Review of Systems  Objective:  BP 138/72   Pulse 72   Temp 98.6 F (37 C) (Oral)   Ht 5' 7 (1.702 m)   Wt 231 lb 4 oz (104.9 kg)   SpO2 99%   BMI 36.22 kg/m   Wt Readings from Last 3 Encounters:  09/04/23 231 lb 4 oz (104.9 kg)  08/12/23 229 lb 9.6 oz (104.1 kg)  07/22/23 233 lb (105.7 kg)      Physical Exam Vitals and nursing note reviewed.  Constitutional:      Appearance: Normal appearance. He is not ill-appearing.  HENT:     Head: Normocephalic and atraumatic. No right periorbital erythema or left periorbital erythema.     Jaw: There is normal jaw occlusion. No tenderness or pain on movement.     Salivary Glands: Right salivary gland is not diffusely enlarged or tender. Left salivary gland is not diffusely enlarged or tender.      Comments: Mild puffiness to left cheek    Right Ear: Hearing, tympanic membrane, ear canal and external ear normal. There is no impacted cerumen.     Left Ear: Hearing, tympanic membrane, ear canal and external ear normal. There is no impacted cerumen.     Nose: Nose normal. No mucosal edema, congestion or rhinorrhea.     Right Turbinates: Not enlarged or swollen.     Left Turbinates: Not enlarged or swollen.     Right Sinus: No maxillary sinus tenderness or frontal sinus tenderness.     Left Sinus: No maxillary sinus tenderness or frontal sinus tenderness.     Mouth/Throat:     Mouth: Mucous membranes are moist.     Pharynx: Oropharynx is clear. No oropharyngeal exudate or posterior oropharyngeal erythema.  Eyes:     General:        Right eye: No discharge.        Left eye: No discharge.     Extraocular Movements:  Extraocular movements intact.     Conjunctiva/sclera: Conjunctivae normal.     Pupils: Pupils are equal, round, and reactive to light.  Musculoskeletal:        General: No swelling or tenderness.     Cervical back: Normal range of motion and neck supple. No rigidity.     Right lower leg: No edema.     Left lower leg: No edema.     Comments:  Left  leg circ 42cm Right leg circ 42cm  Lymphadenopathy:     Cervical: No cervical adenopathy.  Skin:    General: Skin is warm and dry.     Findings: No rash.  Neurological:     General: No focal deficit present.     Mental Status: He is alert.  Psychiatric:        Mood and Affect: Mood normal.        Behavior: Behavior normal.       Results for orders placed or performed in visit on 08/12/23  Basic metabolic panel with GFR   Collection Time: 08/12/23  9:01 AM  Result Value Ref Range   Glucose 105 (H) 70 - 99 mg/dL   BUN 20 8 - 27 mg/dL   Creatinine, Ser 9.14 0.76 - 1.27 mg/dL   eGFR 91 >40 fO/fpw/8.26   BUN/Creatinine Ratio 24 10 - 24   Sodium 138 134 - 144 mmol/L   Potassium 4.5 3.5 - 5.2 mmol/L   Chloride 101 96 - 106 mmol/L   CO2 20 20 - 29 mmol/L   Calcium  9.5 8.6 - 10.2 mg/dL    Assessment & Plan:  I personally spent a total of 25 minutes in the care of the patient today including preparing to see the patient, getting/reviewing separately obtained history, performing a medically appropriate exam/evaluation, counseling and educating, placing orders, and documenting clinical information in the EHR.   Problem List Items Addressed This Visit     Right knee pain   Acute on chronic pain and swelling in known OA s/p previous viscosupplementation 2018.  Anticipate acute exacerbation of osteoarthritis, with possible contribution of lumbar pain as worsened off gabapentin  - in known h/o lumbar back issues (spondylosis and facet arthropathy with resultant foraminal and spinal stenosis).  Rec restart gabapentin , update with effect.        Chronic midline low back pain   Relevant Medications   gabapentin  (NEURONTIN ) 300 MG capsule   Amnestic MCI (mild cognitive impairment with memory loss)   MMSE (03/2023) = 22/30 (missed 3 orientation, 2 calculation, 3 recall), CDT 4/4, independent in ADLs/IADLs Aricept  worsened headaches.  Only on vitamin E  1000 BID.  Pending neurology eval next month.       Left facial pressure and pain - Primary   Previously treated for L facial cellulitis early 2025 with keflex  course with benefit.  Today's exam not consistent with cellulitis/infection. He has mild swelling at L cheek below eye , but no erythema, warmth or significant pain. No temporal pain or ear pain or abnormal ear exam. ?maxillary trigeminal neuralgia as it was better on gabapentin  although description of pain not typical of [place] - rec restart gabapentin , at lower 300mg  nightly dose (was previously BID). Discussed I don't have significant concerns regarding him taking this medication especially at currently low doses.  Location of pain not consistent with nervus intermedius neuralgia.  Update with effect.        Meds ordered this encounter  Medications   gabapentin  (NEURONTIN ) 300 MG capsule    Sig: Take 1 capsule (300 mg total) by mouth at bedtime.    Dispense:  30 capsule    Refill:  3   Multiple Vitamin (MULTIVITAMIN ADULT) TABS    Sig: Take 1 tablet by mouth daily.    No orders of the defined types were placed in this encounter.   Patient Instructions  ?nervus intermedius neuralgia.  Restart gabapentin  300mg  nightly and monitor effect o leg pain, back pain,  left facial pain which may be coming from irritated nerve  Gabapentin  refilled to [facility] outpatient pharmacy.  Let us  know if worsening symptoms despite this.   Follow up plan: No follow-ups on file.  Anton Blas, MD

## 2023-09-04 NOTE — Patient Instructions (Addendum)
?  nervus intermedius neuralgia.  Restart gabapentin  300mg  nightly and monitor effect o leg pain, back pain, left facial pain which may be coming from irritated nerve  Gabapentin  refilled to [facility] outpatient pharmacy.  Let us  know if worsening symptoms despite this. ww

## 2023-09-05 ENCOUNTER — Encounter: Payer: Self-pay | Admitting: Family Medicine

## 2023-09-05 DIAGNOSIS — N401 Enlarged prostate with lower urinary tract symptoms: Secondary | ICD-10-CM | POA: Diagnosis not present

## 2023-09-05 DIAGNOSIS — R972 Elevated prostate specific antigen [PSA]: Secondary | ICD-10-CM | POA: Diagnosis not present

## 2023-09-05 DIAGNOSIS — R351 Nocturia: Secondary | ICD-10-CM | POA: Diagnosis not present

## 2023-09-05 NOTE — Assessment & Plan Note (Addendum)
 MMSE (03/2023) = 22/30 (missed 3 orientation, 2 calculation, 3 recall), CDT 4/4, independent in ADLs/IADLs Aricept  worsened headaches.  Only on vitamin E  1000 BID.  Pending neurology eval next month.

## 2023-09-05 NOTE — Assessment & Plan Note (Addendum)
 Previously treated for L facial cellulitis early 2025 with keflex  course with benefit.  Today's exam not consistent with cellulitis/infection. He has mild swelling at L cheek below eye , but no erythema, warmth or significant pain. No temporal pain or ear pain or abnormal ear exam. ?maxillary trigeminal neuralgia as it was better on gabapentin  although description of pain not typical of [place] - rec restart gabapentin , at lower 300mg  nightly dose (was previously BID). Discussed I don't have significant concerns regarding him taking this medication especially at currently low doses.  Location of pain not consistent with nervus intermedius neuralgia.  Update with effect.

## 2023-09-05 NOTE — Assessment & Plan Note (Addendum)
 Acute on chronic pain and swelling in known OA s/p previous viscosupplementation 2018.  Anticipate acute exacerbation of osteoarthritis, with possible contribution of lumbar pain as worsened off gabapentin  - in known h/o lumbar back issues (spondylosis and facet arthropathy with resultant foraminal and spinal stenosis).  Rec restart gabapentin , update with effect.

## 2023-09-06 ENCOUNTER — Other Ambulatory Visit: Payer: Self-pay

## 2023-09-20 ENCOUNTER — Other Ambulatory Visit: Payer: Self-pay

## 2023-10-02 MED FILL — Dulaglutide Soln Auto-injector 0.75 MG/0.5ML: SUBCUTANEOUS | 28 days supply | Qty: 2 | Fill #3 | Status: AC

## 2023-10-09 ENCOUNTER — Other Ambulatory Visit: Payer: Self-pay

## 2023-10-09 ENCOUNTER — Encounter: Payer: Self-pay | Admitting: Neurology

## 2023-10-09 ENCOUNTER — Ambulatory Visit: Admitting: Neurology

## 2023-10-09 VITALS — BP 125/69 | HR 67 | Ht 69.0 in | Wt 232.0 lb

## 2023-10-09 DIAGNOSIS — G3184 Mild cognitive impairment, so stated: Secondary | ICD-10-CM

## 2023-10-09 DIAGNOSIS — R413 Other amnesia: Secondary | ICD-10-CM

## 2023-10-09 DIAGNOSIS — E669 Obesity, unspecified: Secondary | ICD-10-CM | POA: Diagnosis not present

## 2023-10-09 DIAGNOSIS — G4733 Obstructive sleep apnea (adult) (pediatric): Secondary | ICD-10-CM | POA: Diagnosis not present

## 2023-10-09 MED ORDER — MEMANTINE HCL 5 MG PO TABS
5.0000 mg | ORAL_TABLET | Freq: Two times a day (BID) | ORAL | 5 refills | Status: AC
  Filled 2023-10-09: qty 60, 30d supply, fill #0
  Filled 2023-11-06: qty 60, 30d supply, fill #1
  Filled 2023-12-06: qty 60, 30d supply, fill #2
  Filled 2023-12-28 – 2023-12-30 (×2): qty 60, 30d supply, fill #3
  Filled 2024-02-02: qty 60, 30d supply, fill #4
  Filled 2024-02-18 – 2024-02-25 (×2): qty 60, 30d supply, fill #5

## 2023-10-09 NOTE — Progress Notes (Signed)
 Subjective:    Patient ID: Casey Golden is a 73 y.o. male.  HPI    True Mar, MD, PhD [facility] [address] [address] [place], [place] [address]  Dear Dr. Rilla,  I saw your patient, Casey Golden, upon your kind request in my neurologic clinic today for evaluation of his memory loss.  The patient is unaccompanied today.  As you know, Casey Golden is a 73 year old male with an underlying medical history of obstructive sleep apnea (no longer on PAP therapy), hypertension, hyperlipidemia, arthritis, coronary artery disease, hearing loss, with hearing aids status, post multiple surgeries including hernia repair, appendectomy, cardiac catheterizations, cataract extractions, cervical fusion, dental surgery, hydrocele excision, Mohs surgery, tonsillectomy, history of Paget's disease, prior history of smoking, diabetes, allergies, nonalcoholic fatty liver, reflux disease, and obesity, who reports doing fairly well with regards to his memory, he does not elaborate very much but reports that he has had forgetfulness and short-term memory issues for the past few months.  He reports that he functions fairly well.  He still works.  He has not had any issues driving.  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  He tries to hydrate well.  He reports that he drinks about 5-6 bottles of water per day, 16.9 ounce size each.  He does not drink any alcohol.  He has stopped using his CPAP.  He does not drink caffeine daily, usually decaf tea and decaf coffee.  He takes gabapentin  as needed at night.  He has a history of low back pain.  He also has a history of arthritis in the knee, has received 3 shots in the right knee.  He was supposed to have back surgery at some point but never did end up having surgery.  He is not aware of any dementia diagnosis in the family but mom had memory loss in her late 62s or mid 65s.  I reviewed his AutoPap compliance data, there was no  data since January 2023.  I reviewed your office note from 03/12/2023.  His MMSE was 24/30 in November 2024.  He has been on vitamin B12 supplementation.  He had blood work through your office in June 2025 and I reviewed test results in his electronic chart. He was tried on Aricept  but it was discontinued due to side effects.   His A1c was 6.3.  His lipid panel showed benign findings, his vitamin D  B12 was 939, CMP benign.  Last TSH from November 2024 was normal at 0.91.  He had sleep testing in 2019  He had a brain MRI without contrast on 12/20/2022 for indication of numbness or tingling, paresthesia, right facial paresthesia, exclude stroke.  I reviewed the results:   IMPRESSION: 1. No acute intracranial abnormality. 2. Stable mild chronic microvascular ischemic changes of the white matter. 3. Prominence of the supratentorial ventricular system, suggestive of predominantly central parenchymal volume loss. In addition, I personally and independently reviewed images through the PACS system.  We have previously followed him in our sleep clinic in the past.  He was last seen by our nurse practitioner in November 2022, at which time he reported not using his CPAP any longer.  Previously:  12/27/2020 Casey Russell, NP): <<Casey Golden is a 73 year old male with a history of obstructive sleep apnea on CPAP.  He returns today for follow-up.  The patient reports that he is not using CPAP machine in 6 months.  States that there is some issue with  the billing department and he refuses to use his machine until the DME company fixes this.   Patient states that he had 2 hospitalizations and was advised by his PCP  and ED to discuss this with our office.  I was unaware of these hospitalizations and have not reviewed the notes nor did we receive a new referral.  I advised the patient that we would read over the notes and get him scheduled with our office appropriately. >>   12/28/19 (MM): << Casey Golden is a  73 year old male with a history of obstructive sleep apnea on CPAP.  He returns today for follow-up.  Download indicates that he use his machine 27 out of 30 days for compliance of 90%.  He uses machine greater than 4 hours 16 days for compliance of 53%.  On average he uses his machine 5 hours and 7 minutes.  His residual AHI is 4.5 on 7 cm of water with EPR of 3.  Reports that the CPAP works well for him.  He denies any new issues.  Returns today for follow-up.   >>   12/22/2018 (SA): 73 year old right-handed gentleman with an underlying medical history of type 2 diabetes, allergic rhinitis, hypertension, hyperlipidemia, reflux disease, fatty liver, coronary artery disease, arthritis and obesity who presents for follow-up consultation of his obstructive sleep apnea, on CPAP therapy. The patient is unaccompanied today and presents for his yearly checkup.  I last saw him on 12/20/2018, at which time he was compliant with his CPAP which he started after his CPAP titration study.    I reviewed his CPAP compliance data From 11/18/2018 through 12/17/2018 which is a total of 30 days, during which time he used his machine 28 days with percent use days greater than 4 hours at 80%, indicating very good compliance with an average usage of 5 hours and 24 minutes, residual AHI borderline at 5.2/h, leak on the high side with a 95th percentile at 21.1 L/min on a pressure of 7 cm with EPR of 3. He reports generally doing okay with his CPAP but sometimes the mask is uncomfortable as he had a skin cancer removed from the upper left lip area.  He has been seeing other specialists, he has a lesion in his groin that needs removing.  In the process of working this up he had a CT of his chest, abdomen and pelvis, he was told he had a spot on the lung which needs monitoring.  He is up-to-date with his CPAP related supplies and motivated to continue with treatment, he continues to benefit from it.       I saw him on 07/01/2017,  at which time he was compliant with his AutoPap. However, he was still struggling with tolerance, he was recently treated for an upper respiratory infection.    We did an overnight pulse oximetry test in the interim on 07/05/2017 which showed ongoing issues with desaturations at night with an O2 nadir of 81% and time below 89% saturation of nearly 12 minutes for the night. He was advised to return for a full night CPAP titration study. He had this on 08/14/2017. Sleep efficiency was 80.7%, sleep latency 12 minutes, REM latency 58 minutes. He was fitted with a nasal mask but was noted to have mouth venting. He declined a trial of full face mask. CPAP was started at 5 cm and advanced a 7 cm. On the final pressure his AHI was 0.4 per hour with supine REM sleep achieved an O2  nadir of 90%. Based on his test results I prescribed CPAP therapy for home use at a pressure of 7 cm.    I reviewed his CPAP compliance data from 11/18/2017 through 12/17/2017 which is a total of 30 days, during which time he used his CPAP 29 days with percent used days greater than 4 hours at 87%, indicating very good compliance with an average usage of 6 hours and 11 minutes, residual AHI borderline at 4.5 per hour, leak on the high side with the 95th percentile at 24.6 L/m on a pressure of 7 cm with EPR of 3.    I first met him on 12/04/2016 at the request of his primary care physician, at which time the patient reported a prior diagnosis of sleep apnea, he had a diagnostic test in July 2015 which indicated mild sleep apnea he had a CPAP titration study in August 2015 which determined an adequate treatment pressure of 8 cm. He had not start CPAP therapy at the time. He had a home sleep test on 01/14/2017 which indicated mild sleep apnea with an AHI of 9.5 per hour, however desaturation nadir was 69% which was significant, and time below or at 88% saturation was 24 minutes for the test time of 7 hours and 45 minutes. He was advised to start  AutoPap therapy.   I reviewed his AutoPap compliance data from 05/28/2017 through 06/26/2017 which is a total of 30 days, during which time he used his machine every night with percent used days greater than 4 hours at 87%, indicating very good compliance however average usage of only 5 hours and 6 minutes, residual AHI borderline at 5.5 per hour, 95th percentile pressure at 9.2 cm, leak quite high with the 95th percentile at 46.7 L/m at a pressure range of 6 cm to 13 cm with EPR.    12/04/2016: (He) was previously diagnosed with obstructive sleep apnea. I reviewed his prior study results from 2015. He had a baseline sleep study on 07/29/2013 which showed an AHI of 11.3 per hour, 37.9 per hour during REM sleep. REM latency was 50 minutes, sleep efficiency was 80.3%. Average oxygen saturation was 96.8%, nadir was 72.6%. He had a PLM index of 51.2 with an associated arousal index of 0.5 per hour. He had a CPAP titration study on 09/16/2013. He was titrated from 5 cm to 10 cm. His optimal pressure was deemed 8 cm. His BMI at that time was 32. I reviewed your office note from 11/22/2016. He has not actually been on CPAP therapy. I reviewed your office note from 10/25/2016 as well. His Epworth sleepiness score is 10 out of 24 on the fatigue score is 15 out of 63. He lives at home with his wife. They have 3 children. He works at [employer]. He quit smoking in 1990 and does not typically drink alcohol, drinks caffeine in the form of coffee, 5 cups a day on average. His bedtime is typically around 11. By that time he has typically already taken a short nap while watching TV. His wake up time is around 5, he helps out with his grandchildren, taking them to school. He denies restless leg symptoms. He has neuropathy secondary to diabetes and is on gabapentin . He has a history of pneumonia twice or 3 times in his life, also bronchitis. He denies morning headaches but has nocturia about once or twice per average  night.   His Past Medical History Is Significant For: Past Medical History:  Diagnosis Date  Arthritis    CAD (coronary artery disease)    cathx3 with nonobstructive disease.   Cancer [facility])    Cataract 2019   bilateral; resolved with surgery   Colonic polyp    Fatty liver disease, nonalcoholic 2015   by US    GERD (gastroesophageal reflux disease)    History of chicken pox    Hyperlipidemia    Hypertension    Nocturia    OSA (obstructive sleep apnea)    CPAP, compliant   Paget's disease of bony pelvis    Past use of tobacco    Quit 1990, 70 pack year history   Rash of genital area    09-08-2014  per pt Dr Nieves aware   Seasonal and perennial allergic rhinitis    Stroke (HCC)    Type 2 diabetes mellitus (HCC)    Wears dentures    full upper/  partial lower   Wears glasses     His Past Surgical History Is Significant For: Past Surgical History:  Procedure Laterality Date   ABDOMINAL HERNIA REPAIR  2007      [facility]   open repair   APPENDECTOMY  age 78   CARDIAC CATHETERIZATION  12-23-2007   [facility]   Abnormal myoview w/ ischemia/  40% mRCA with nonobstructive and no sig. plaque in his left system, EF 55%   CARDIAC CATHETERIZATION  Apr 2008    [facility]   Abnormal myoview/  50% RCA,  ef 65%   CARDIAC CATHETERIZATION  1999      [facility]   CATARACT EXTRACTION, BILATERAL Bilateral 11/2017   CERVICAL FUSION  1992   CHOLECYSTECTOMY OPEN  2006   COLONOSCOPY WITH PROPOFOL  N/A 04/17/2016   TAs, high grade dysplasia with margins clear, diverticulosis [name], MD)   COLONOSCOPY WITH PROPOFOL  N/A 08/07/2016   TAx1, diverticulosis, rpt 3 yrs ([facility])   COLONOSCOPY WITH PROPOFOL  N/A 01/05/2020   [facility], rpt [name], Darren, MD)   DENTAL SURGERY     metal dental implant L mandible   EXCISION OF SKIN TAG Right 09/14/2014   Procedure: EXCISION OF SKIN TAG;  Surgeon: Donnice Nieves, MD;  Location: [facility];  Service: Urology;  Laterality: Right;   GROIN MASS OPEN BIOPSY  Left 01/17/2019   HYDROCELE EXCISION Left 09/14/2014   Procedure: LEFT HYDROCELECTOMY ADULT;  Surgeon: Donnice Nieves, MD;  Location: [facility];  Service: Urology;  Laterality: Left;   LEFT HEART CATH AND CORONARY ANGIOGRAPHY Left 02/15/2021   Procedure: LEFT HEART CATH AND CORONARY ANGIOGRAPHY;  Surgeon: Mady Bruckner, MD;  Location: [facility] INVASIVE CV LAB;  Service: Cardiovascular;  Laterality: Left;   LEFT HEART CATH AND CORONARY ANGIOGRAPHY Left 05/21/2023   Procedure: LEFT HEART CATH AND CORONARY ANGIOGRAPHY;  Surgeon: Mady Bruckner, MD;  Location: [facility] INVASIVE CV LAB;  Service: Cardiovascular;  Laterality: Left;   MOHS SURGERY  2015   skin cancer   TONSILLECTOMY  age 23    His Family History Is Significant For: Family History  Problem Relation Age of Onset   CAD Mother        MI   Diabetes Mother    Stroke Mother        mini-stroke   Cancer Father 70       lung (smoker)   Cancer Sister        lung   Heart disease Paternal Grandmother    Heart disease Paternal Grandfather    Diabetes Paternal Grandfather    Coronary artery disease Neg Hx  Premature   Sleep apnea Neg Hx    Dementia Neg Hx     His Social History Is Significant For: Social History   Socioeconomic History   Marital status: Married    Spouse name: Not on file   Number of children: 3   Years of education: Not on file   Highest education level: 12th grade  Occupational History   Occupation: Full time    Employer: [employer]  Tobacco Use   Smoking status: Former    Current packs/day: 0.00    Average packs/day: 2.0 packs/day for 35.0 years (70.0 ttl pk-yrs)    Types: Cigarettes    Start date: 01/30/1955    Quit date: 01/29/1990    Years since quitting: 33.7   Smokeless tobacco: Never   Tobacco comments:    States quit 01/29/1997  Vaping Use   Vaping status: Never Used  Substance and Sexual Activity   Alcohol use: No   Drug use: No   Sexual activity: Yes  Other  Topics Concern   Not on file  Social History Narrative   Lives with wife, dogs and cats    Occupation: retired, was self employed, now works at [employer]    Edu: HS   Activity: walks 1.5 mi daily   Diet: some water, fruits/vegetables daily      Social Drivers of Corporate investment banker Strain: Low Risk  (07/22/2023)   Overall Financial Resource Strain (CARDIA)    Difficulty of Paying Living Expenses: Not hard at all  Food Insecurity: No Food Insecurity (07/22/2023)   Hunger Vital Sign    Worried About Running Out of Food in the Last Year: Never true    Ran Out of Food in the Last Year: Never true  Transportation Needs: No Transportation Needs (07/22/2023)   PRAPARE - Administrator, Civil Service (Medical): No    Lack of Transportation (Non-Medical): No  Physical Activity: Sufficiently Active (07/22/2023)   Exercise Vital Sign    Days of Exercise per Week: 5 days    Minutes of Exercise per Session: 30 min  Stress: No Stress Concern Present (07/22/2023)   [employer] of Occupational Health - Occupational Stress Questionnaire    Feeling of Stress: Not at all  Social Connections: Moderately Isolated (07/22/2023)   Social Connection and Isolation Panel    Frequency of Communication with Friends and Family: More than three times a week    Frequency of Social Gatherings with Friends and Family: More than three times a week    Attends Religious Services: Patient declined    Database administrator or Organizations: No    Attends Engineer, structural: Never    Marital Status: Married    His Allergies Are:  Allergies  Allergen Reactions   Aricept  [Donepezil ] Other (See Comments)    HA, didn't feel well on this   Elemental Sulfur    Imdur  [Isosorbide  Nitrate]     H/A, Sick, could not get out of bed   Sulfa Antibiotics Hives  :   His Current Medications Are:  Outpatient Encounter Medications as of 10/09/2023  Medication Sig   Accu-Chek Softclix  Lancets lancets Use to check blood glucose 3 times a day   amLODipine  (NORVASC ) 5 MG tablet Take 1 tablet (5 mg total) by mouth daily.   aspirin  81 MG EC tablet Take 1 tablet (81 mg total) by mouth daily.   atorvastatin  (LIPITOR) 40 MG tablet Take 1 tablet (40 mg  total) by mouth daily.   Blood Glucose Monitoring Suppl (BLOOD GLUCOSE MONITOR SYSTEM) w/Device KIT Use to check blood sugar 3 (three) times daily.   cyanocobalamin  (VITAMIN B12) 1000 MCG tablet Take 1 tablet (1,000 mcg total) by mouth every Monday, Wednesday, and Friday.   diclofenac  sodium (VOLTAREN ) 1 % GEL APPLY 2 G TOPICALLY 3 (THREE) TIMES DAILY AS NEEDED (ANTI INFLAMMATORY). (Patient taking differently: Apply 2 g topically 3 (three) times daily as needed (anti inflammatory). 2 times)   Dulaglutide  (TRULICITY ) 0.75 MG/0.5ML SOAJ Inject 0.75 mg into the skin once a week.   gabapentin  (NEURONTIN ) 300 MG capsule Take 1 capsule (300 mg total) by mouth at bedtime. (Patient taking differently: Take 300 mg by mouth at bedtime. Takes as needed)   glucose blood test strip Use to check blood glucose 3 times a day   Lancets (ONETOUCH DELICA PLUS LANCET33G) MISC Use to check blood glucose 3 (three) times daily.   losartan  (COZAAR ) 100 MG tablet Take 1 tablet (100 mg total) by mouth daily.   Multiple Vitamin (MULTIVITAMIN ADULT) TABS Take 1 tablet by mouth daily.   nitroGLYCERIN  (NITROSTAT ) 0.4 MG SL tablet Place 1 tablet (0.4 mg total) under the tongue every 5 (five) minutes as needed for chest pain (do not take more than 3 doses.).   vitamin E  1000 UNIT capsule Take 1 capsule (1,000 Units total) by mouth in the morning and at bedtime. (Patient taking differently: Take 1,000 Units by mouth in the morning and at bedtime. Daily)   furosemide  (LASIX ) 20 MG tablet Take 1 tablet (20 mg total) by mouth daily.   No facility-administered encounter medications on file as of 10/09/2023.  :   Review of Systems:  Out of a complete 14 point review of  systems, all are reviewed and negative with the exception of these symptoms as listed below:   Review of Systems  Neurological:        Patient is here alone for memory concerns. He states he has noticed it sometimes but his wife has mostly. Symptoms ongoing for awhile. MMSE 22/30 animals 13.    Objective:  Neurological Exam  Physical Exam Physical Examination:   Vitals:   10/09/23 1057  BP: 125/69  Pulse: 67    General Examination: The patient is a very pleasant 73 y.o. male in no acute distress. He appears well-developed and well-nourished and well groomed.   HEENT: Normocephalic, atraumatic, pupils are equal, round and reactive to light, corrective eye glasses in place. Extraocular tracking is good without limitation to gaze excursion or nystagmus noted. Normal smooth pursuit is noted. Hearing is mildly impaired. Face is symmetric with normal facial animation and normal facial sensation. Speech is clear with no dysarthria noted. There is no hypophonia. There is no lip, neck/head, jaw or voice tremor. Neck with FROM. Small scar left upper lip area. Oropharynx exam reveals: mild to moderate mouth dryness, adequate dental hygiene with full dentures. Tongue protrudes centrally and palate elevates symmetrically. Tonsils are absent.     Chest: Clear to auscultation without wheezing, rhonchi or crackles noted.   Heart: S1+S2+0, regular and normal without murmurs, rubs or gallops noted.    Abdomen: Soft, non-tender and non-distended with normal bowel sounds appreciated on auscultation.   Extremities: There is no pitting edema in the distal lower extremities.     Skin: Warm and dry without trophic changes noted.     Musculoskeletal: exam reveals abnormal posture with increase in lumbar kyphosis and upper body tilted to the  right especially when standing.  Right knee discomfort reported.   Neurologically:  Mental status: The patient is awake, alert and oriented in all 4 spheres. His  immediate and remote memory, attention, language skills and fund of knowledge are fair.  He does not give a very elaborate history.  Some word finding difficulty noted.  Affect is normal.     10/09/2023   11:01 AM 07/06/2020    1:27 PM 05/21/2018   12:31 PM 05/13/2017   10:10 AM  MMSE - Mini Mental State Exam  Orientation to time 3 5 5 5   Orientation to Place 5 5 5 5   Registration 3 3 3 3   Attention/ Calculation 3 5 0 0  Recall 0 3 2 2   Recall-comments   unable to recall 1 of 3 words unable to recall 1 of 3 words  Language- name 2 objects 2  0 0  Language- repeat 1 1 1 1   Language- follow 3 step command 3  0 1  Language- follow 3 step command-comments    unable to follow 1 step of 3 step command  Language- read & follow direction 1  0 0  Write a sentence 1  0 0  Copy design 0  0 0  Total score 22  16 17     Cranial nerves II - XII are as described above under HEENT exam.  Motor exam: Normal bulk, strength and tone is noted. There is no tremor. Fine motor skills and coordination: grossly intact.  Cerebellar testing: No dysmetria or intention tremor. There is no truncal or gait ataxia.  Sensory exam: intact to light touch in the upper and lower extremities.   Assessment and plan:  In summary, Casey Golden is a very pleasant 73 y.o.-year old male with an underlying medical history of obstructive sleep apnea (no longer on PAP therapy), hypertension, hyperlipidemia, arthritis, coronary artery disease, hearing loss, with hearing aids status, post multiple surgeries including hernia repair, appendectomy, cardiac catheterizations, cataract extractions, cervical fusion, dental surgery, hydrocele excision, Mohs surgery, tonsillectomy, history of Paget's disease, prior history of smoking, diabetes, allergies, nonalcoholic fatty liver, reflux disease, and obesity, who presents for evaluation of his memory loss of several months duration.  History and examination are supportive of mild cognitive  impairment.  He does have multiple vascular risk factors, currently also not being treated for his sleep apnea.  He has a history of mild obstructive sleep apnea and is agreeable to getting reevaluated.  He has tried donepezil  some months ago but had side effects.  I had a long discussion with the patient today regarding memory loss, the causes of it, supportive care, lifestyle modification and medication possibilities.    Below is a summary of my recommendations and our discussion points from today's visit, based on chart review, history and examination. They were given these instructions verbally during the visit in detail and also in writing in the MyChart after visit summary (AVS), which they can access electronically.   <<   We will do a home sleep test to reevaluate you for sleep apnea.  If you still have obstructive sleep apnea I would likely recommend that you restart using an AutoPap machine.   You had recent blood work and I do not believe we need to do any repeat testing quite yet.  You had a recent brain MRI within the past year, I do not see a pressing reason to repeat your brain MRI at this time.   Please continue to hydrate  well with water.  Avoid alcohol.  Limit your caffeine to 1 or 2 servings per day.   We will start you for your memory loss a new medication called Namenda  (generic name: Memantine ), starting at 5 mg twice daily with gradual buildup to eventually 10 mg twice daily. Please note that side effects may include, but are not limited to: nausea, confusion, hallucination, personality changes. If you are having mild side effects, try to stick with the treatment as these initial side effects may go away after the first 10-14 days.    We will keep you posted as to your home sleep test results by phone call for now and plan a follow-up in this clinic for you to see one of our nurse practitioners in about 6 months routinely.  >>    Thank you very much for allowing me to  participate in the care of this nice patient. If I can be of any further assistance to you please do not hesitate to call me at [phone].  Sincerely,   True Mar, MD, PhD  I spent 60 minutes in total face-to-face time and in reviewing records during pre-charting, more than 50% of which was spent in counseling and coordination of care, reviewing test results, reviewing medications and treatment regimen and/or in discussing or reviewing the diagnosis of memory loss, the prognosis and treatment options. Pertinent laboratory and imaging test results that were available during this visit with the patient were reviewed by me and considered in my medical decision making (see chart for details).

## 2023-10-09 NOTE — Patient Instructions (Signed)
 It was nice to see you today. You have complaints of memory loss: memory loss or changes in cognitive function can have many reasons and does not always mean you have dementia.  There are several conditions and situations that can contribute to subjective or objective memory loss.  These factors include: depression, stress, sleep deprivation or poor sleep from insomnia or sleep apnea, dehydration, fluctuation in blood sugar values, thyroid  or electrolyte dysfunction, medication effects from sedating medications or narcotic pain medication for example and certain vitamin deficiencies such as vitamin B12 deficiency, and anemia. Dementia can be caused by stroke, brain atherosclerosis or brain vascular disease due to vascular risk factors (smoking, high blood pressure, high cholesterol, obesity and uncontrolled diabetes), certain degenerative brain disorders (including Parkinson's disease and Multiple sclerosis) and by Alzheimer's disease or other, more rare and sometimes hereditary causes.   Here is what I would recommend:   We will do a home sleep test to reevaluate you for sleep apnea.  If you still have obstructive sleep apnea I would likely recommend that you restart using an AutoPap machine.   You had recent blood work and I do not believe we need to do any repeat testing quite yet.  You had a recent brain MRI within the past year, I do not see a pressing reason to repeat your brain MRI at this time.   Please continue to hydrate well with water.  Avoid alcohol.  Limit your caffeine to 1 or 2 servings per day.   We will start you for your memory loss a new medication called Namenda  (generic name: Memantine ), starting at 5 mg twice daily with gradual buildup to eventually 10 mg twice daily. Please note that side effects may include, but are not limited to: nausea, confusion, hallucination, personality changes. If you are having mild side effects, try to stick with the treatment as these initial side effects  may go away after the first 10-14 days.    We will keep you posted as to your home sleep test results by phone call for now and plan a follow-up in this clinic for you to see one of our nurse practitioners in about 6 months routinely.

## 2023-10-10 ENCOUNTER — Telehealth: Payer: Self-pay | Admitting: Neurology

## 2023-10-10 ENCOUNTER — Ambulatory Visit: Admitting: Neurology

## 2023-10-10 NOTE — Telephone Encounter (Signed)
 HST- HTA pending

## 2023-10-13 ENCOUNTER — Other Ambulatory Visit: Payer: Self-pay | Admitting: Family Medicine

## 2023-10-13 DIAGNOSIS — E538 Deficiency of other specified B group vitamins: Secondary | ICD-10-CM

## 2023-10-13 DIAGNOSIS — E1169 Type 2 diabetes mellitus with other specified complication: Secondary | ICD-10-CM

## 2023-10-13 DIAGNOSIS — R972 Elevated prostate specific antigen [PSA]: Secondary | ICD-10-CM

## 2023-10-13 DIAGNOSIS — G3184 Mild cognitive impairment, so stated: Secondary | ICD-10-CM

## 2023-10-14 ENCOUNTER — Other Ambulatory Visit (INDEPENDENT_AMBULATORY_CARE_PROVIDER_SITE_OTHER)

## 2023-10-14 ENCOUNTER — Other Ambulatory Visit: Payer: Self-pay | Admitting: Radiology

## 2023-10-14 ENCOUNTER — Other Ambulatory Visit: Payer: Self-pay

## 2023-10-14 ENCOUNTER — Ambulatory Visit: Payer: Self-pay | Admitting: Family Medicine

## 2023-10-14 DIAGNOSIS — E785 Hyperlipidemia, unspecified: Secondary | ICD-10-CM

## 2023-10-14 DIAGNOSIS — G3184 Mild cognitive impairment, so stated: Secondary | ICD-10-CM | POA: Diagnosis not present

## 2023-10-14 DIAGNOSIS — R972 Elevated prostate specific antigen [PSA]: Secondary | ICD-10-CM | POA: Diagnosis not present

## 2023-10-14 DIAGNOSIS — E1169 Type 2 diabetes mellitus with other specified complication: Secondary | ICD-10-CM

## 2023-10-14 LAB — COMPREHENSIVE METABOLIC PANEL WITH GFR
ALT: 32 U/L (ref 0–53)
AST: 29 U/L (ref 0–37)
Albumin: 4.4 g/dL (ref 3.5–5.2)
Alkaline Phosphatase: 63 U/L (ref 39–117)
BUN: 18 mg/dL (ref 6–23)
CO2: 30 meq/L (ref 19–32)
Calcium: 9.8 mg/dL (ref 8.4–10.5)
Chloride: 104 meq/L (ref 96–112)
Creatinine, Ser: 0.8 mg/dL (ref 0.40–1.50)
GFR: 86.6 mL/min (ref >60.00)
Glucose, Bld: 123 mg/dL — ABNORMAL HIGH (ref 70–99)
Potassium: 5.1 meq/L (ref 3.5–5.1)
Sodium: 142 meq/L (ref 135–145)
Total Bilirubin: 0.7 mg/dL (ref 0.2–1.2)
Total Protein: 7 g/dL (ref 6.0–8.3)

## 2023-10-14 LAB — CBC WITH DIFFERENTIAL/PLATELET
Basophils Absolute: 0.1 K/uL (ref 0.0–0.1)
Basophils Relative: 0.8 % (ref 0.0–3.0)
Eosinophils Absolute: 0.3 K/uL (ref 0.0–0.7)
Eosinophils Relative: 4.1 % (ref 0.0–5.0)
HCT: 44.8 % (ref 39.0–52.0)
Hemoglobin: 15.5 g/dL (ref 13.0–17.0)
Lymphocytes Relative: 39.6 % (ref 12.0–46.0)
Lymphs Abs: 3 K/uL (ref 0.7–4.0)
MCHC: 34.6 g/dL (ref 30.0–36.0)
MCV: 98.6 fl (ref 78.0–100.0)
Monocytes Absolute: 0.6 K/uL (ref 0.1–1.0)
Monocytes Relative: 7.4 % (ref 3.0–12.0)
Neutro Abs: 3.7 K/uL (ref 1.4–7.7)
Neutrophils Relative %: 48.1 % (ref 43.0–77.0)
Platelets: 238 K/uL (ref 150.0–400.0)
RBC: 4.54 Mil/uL (ref 4.22–5.81)
RDW: 12.3 % (ref 11.5–15.5)
WBC: 7.6 K/uL (ref 4.0–10.5)

## 2023-10-14 LAB — TSH: TSH: 0.97 u[IU]/mL (ref 0.35–5.50)

## 2023-10-14 LAB — PSA: PSA: 4.45 ng/mL — ABNORMAL HIGH (ref 0.10–4.00)

## 2023-10-14 NOTE — Addendum Note (Signed)
 Addended by: HOPE VEVA PARAS on: 10/14/2023 07:44 AM   Modules accepted: Orders

## 2023-10-15 LAB — MICROALBUMIN / CREATININE URINE RATIO
Creatinine,U: 38.5 mg/dL
Microalb Creat Ratio: UNDETERMINED mg/g (ref 0.0–30.0)
Microalb, Ur: <0.7 mg/dL

## 2023-10-17 LAB — FRUCTOSAMINE: Fructosamine: 282 umol/L (ref 205–285)

## 2023-10-21 ENCOUNTER — Encounter: Payer: Self-pay | Admitting: Family Medicine

## 2023-10-21 ENCOUNTER — Ambulatory Visit: Admitting: Family Medicine

## 2023-10-21 VITALS — BP 144/72 | HR 65 | Temp 97.8°F | Ht 68.0 in | Wt 230.2 lb

## 2023-10-21 DIAGNOSIS — J432 Centrilobular emphysema: Secondary | ICD-10-CM

## 2023-10-21 DIAGNOSIS — E1169 Type 2 diabetes mellitus with other specified complication: Secondary | ICD-10-CM | POA: Diagnosis not present

## 2023-10-21 DIAGNOSIS — Z23 Encounter for immunization: Secondary | ICD-10-CM | POA: Diagnosis not present

## 2023-10-21 DIAGNOSIS — E785 Hyperlipidemia, unspecified: Secondary | ICD-10-CM | POA: Diagnosis not present

## 2023-10-21 DIAGNOSIS — I25118 Atherosclerotic heart disease of native coronary artery with other forms of angina pectoris: Secondary | ICD-10-CM

## 2023-10-21 DIAGNOSIS — R972 Elevated prostate specific antigen [PSA]: Secondary | ICD-10-CM | POA: Diagnosis not present

## 2023-10-21 DIAGNOSIS — C4499 Other specified malignant neoplasm of skin, unspecified: Secondary | ICD-10-CM | POA: Diagnosis not present

## 2023-10-21 DIAGNOSIS — E538 Deficiency of other specified B group vitamins: Secondary | ICD-10-CM

## 2023-10-21 DIAGNOSIS — Z0000 Encounter for general adult medical examination without abnormal findings: Secondary | ICD-10-CM | POA: Diagnosis not present

## 2023-10-21 DIAGNOSIS — G3184 Mild cognitive impairment, so stated: Secondary | ICD-10-CM | POA: Diagnosis not present

## 2023-10-21 DIAGNOSIS — I679 Cerebrovascular disease, unspecified: Secondary | ICD-10-CM

## 2023-10-21 DIAGNOSIS — Z7189 Other specified counseling: Secondary | ICD-10-CM

## 2023-10-21 DIAGNOSIS — Z7985 Long-term (current) use of injectable non-insulin antidiabetic drugs: Secondary | ICD-10-CM | POA: Diagnosis not present

## 2023-10-21 DIAGNOSIS — I10 Essential (primary) hypertension: Secondary | ICD-10-CM

## 2023-10-21 DIAGNOSIS — H9193 Unspecified hearing loss, bilateral: Secondary | ICD-10-CM

## 2023-10-21 DIAGNOSIS — G4733 Obstructive sleep apnea (adult) (pediatric): Secondary | ICD-10-CM

## 2023-10-21 LAB — POCT GLYCOSYLATED HEMOGLOBIN (HGB A1C): Hemoglobin A1C: 5.8 % — AB (ref 4.0–5.6)

## 2023-10-21 NOTE — Assessment & Plan Note (Signed)
Chronic, mildly elevated on current regimen - continue.

## 2023-10-21 NOTE — Assessment & Plan Note (Addendum)
 Continue vit b12 MWF

## 2023-10-21 NOTE — Assessment & Plan Note (Signed)
 Appreciate neurology care - just started on namenda .

## 2023-10-21 NOTE — Assessment & Plan Note (Signed)
 Chronic, mild, presumed due to BPH.  He will continue regularly seeing urology, states seen in the past 1-2 months.

## 2023-10-21 NOTE — Assessment & Plan Note (Signed)
 Chronic, stable. Continue low dose Trulicity .  POC A1c today.  RTC 6 mo DM f/u visit

## 2023-10-21 NOTE — Assessment & Plan Note (Signed)
 Previously discussed, scanned 2018

## 2023-10-21 NOTE — Assessment & Plan Note (Signed)
 Noted on imaging - stable period off respiratory medication.  Remote smoker.

## 2023-10-21 NOTE — Patient Instructions (Addendum)
 Flu shot today  Fingerstick A1c today Schedule appointment with dermatology as you're due - you previously saw Dr Gregorio at [facility].  We will request last note from Dr Nieves at [facility] urology.  Ok to take gabapentin  300mg  nightly if you feel it is helpful. This is a very low dose that you should tolerate well.  Good to see you today Return as needed or in 6 months for diabetes follow up visit

## 2023-10-21 NOTE — Assessment & Plan Note (Signed)
 Reviewed healthy diet and lifestyle changes to effect sustainable weight loss.   Obesity complicated by comorbidities of OA, OSA, HTN

## 2023-10-21 NOTE — Assessment & Plan Note (Signed)
 Chronic, stable period on atorvastatin  40mg  daily  The ASCVD Risk score (Arnett DK, et al., 2019) failed to calculate for the following reasons:   Risk score cannot be calculated because patient has a medical history suggesting prior/existing ASCVD

## 2023-10-21 NOTE — Assessment & Plan Note (Signed)
 Appreciate cards care.

## 2023-10-21 NOTE — Progress Notes (Signed)
 Ph: [phone] Fax: [phone]   Patient ID: Casey Golden, male    DOB: 1951/01/05, 73 y.o.   MRN: [id number]  This visit was conducted in person.  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 Note reviewed.     07/22/2023    1:17 PM 07/19/2022   10:04 AM 07/17/2021   10:59 AM  6CIT Screen  What Year? 0 points 0 points 0 points  What month? 0 points 0 points 0 points  What time? 0 points 0 points 0 points  Count back from 20 0 points 0 points 0 points  Months in reverse 0 points 0 points 0 points  Repeat phrase 0 points 0 points 0 points  Total Score 0 points 0 points 0 points   Dx MCI with memory loss 11/2022 - aricept  caused headaches. Continues vit E 1000 units bid. Low b12 - on replacement, levels high so now on MWF replacement.  Saw neurology Dr Buck 09/2023 - started on Namenda  5mg  bid with goal to increase to 10mg  bid. He has started and is tolerating well.   OSA not on CPAP - followed by [facility] neurology Dr Buck pending HST for re-evaluation.   He stopped gabapentin  -   12/2018: s/p excision of L perineal extramammary scrotal Paget's disease. Sees Dr Gregorio [facility] dermatology.    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   DM - continues trulicity  0.75mg  weekly. Metformin  recently stopped due to great sugar control with initial GLP1-RA related weight loss.   Preventative: COLONOSCOPY WITH PROPOFOL  04/17/2016 TAs, high grade dysplasia with margins clear, diverticulosis [name], MD)  COLONOSCOPY WITH PROPOFOL  08/07/2016 TAx1,  diverticulosis, rpt 3 yrs ([facility]) COLONOSCOPY WITH PROPOFOL  - 01/05/2020 SSP, rpt 5 yrs [name], Darren, MD) Prostate cancer screening - Dr Nieves at [facility] watching elevated PSA. S/p biopsy 03/2017 - BPH with 1 apical core of atypia, sees yearly in August.  Lung cancer screening - not eligible  Flu shot - yearly  COVID vaccine - Pfizer 03/2019 x2, booster 10/2019, 06/2020, bivalent 01/2021 Td 2017, Tdap 05/2018  Pneumovax 2013, prevnar-13 2017, Prevnar-20 2022 RSV 10/2021  Zostavax - 08/2012 Shingrix - 08/2017, 11/2017 Advanced directive discussion - scanned and in chart 12/2016. Does not want prolonged life support if terminal condition. No HCPOA form filled out. Would want wife Casey Golden then daughter Casey Golden to be HCPOA.  Seat belt use discussed.  Sunscreen use discussed, no changing moles on skin. overdue for derm f/u.  Ex smoker quit 1992 Alcohol - none  Dentist yearly - has upper and lower partial dentures  Eye exam yearly (05/2023) Bowel - mild constipation - managed with fiber in diet  Bladder - no incontinence   Lives with wife, dog and cats Occupation: retired, was self employed, now works at [employer], [employer]: HS Activity: walks 1.5 mi daily Diet: some water, fruits/vegetables daily     Relevant past medical, surgical, family and social  history reviewed and updated as indicated. Interim medical history since our last visit reviewed. Allergies and medications reviewed and updated. Outpatient Medications Prior to Visit  Medication Sig Dispense Refill   Accu-Chek Softclix Lancets lancets Use to check blood glucose 3 times a day 100 each 0   amLODipine  (NORVASC ) 5 MG tablet Take 1 tablet (5 mg total) by mouth daily. 180 tablet 3   aspirin  81 MG EC tablet Take 1 tablet (81 mg total) by mouth daily. 30 tablet 12   atorvastatin  (LIPITOR) 40 MG tablet Take 1 tablet (40 mg total) by mouth daily. 90 tablet 3   Blood Glucose Monitoring Suppl (BLOOD GLUCOSE MONITOR SYSTEM)  w/Device KIT Use to check blood sugar 3 (three) times daily. 1 kit 0   cyanocobalamin  (VITAMIN B12) 1000 MCG tablet Take 1 tablet (1,000 mcg total) by mouth every Monday, Wednesday, and Friday.     Dulaglutide  (TRULICITY ) 0.75 MG/0.5ML SOAJ Inject 0.75 mg into the skin once a week. 2 mL 6   furosemide  (LASIX ) 20 MG tablet Take 1 tablet (20 mg total) by mouth daily. 30 tablet 5   gabapentin  (NEURONTIN ) 300 MG capsule Take 1 capsule (300 mg total) by mouth at bedtime. (Patient taking differently: Take 300 mg by mouth at bedtime. Takes as needed) 30 capsule 3   glucose blood test strip Use to check blood glucose 3 times a day 100 each 0   Lancets (ONETOUCH DELICA PLUS LANCET33G) MISC Use to check blood glucose 3 (three) times daily. 100 each 0   losartan  (COZAAR ) 100 MG tablet Take 1 tablet (100 mg total) by mouth daily. 90 tablet 3   memantine  (NAMENDA ) 5 MG tablet Take 1 tablet (5 mg total) by mouth 2 (two) times daily. 60 tablet 5   Multiple Vitamin (MULTIVITAMIN ADULT) TABS Take 1 tablet by mouth daily.     nitroGLYCERIN  (NITROSTAT ) 0.4 MG SL tablet Place 1 tablet (0.4 mg total) under the tongue every 5 (five) minutes as needed for chest pain (do not take more than 3 doses.). 50 tablet 0   vitamin E  1000 UNIT capsule Take 1 capsule (1,000 Units total) by mouth in the morning and at bedtime. (Patient taking differently: Take 1,000 Units by mouth in the morning and at bedtime. Daily)     diclofenac  sodium (VOLTAREN ) 1 % GEL APPLY 2 G TOPICALLY 3 (THREE) TIMES DAILY AS NEEDED (ANTI INFLAMMATORY). (Patient not taking: Reported on 10/21/2023) 100 g 1   No facility-administered medications prior to visit.     Per HPI unless specifically indicated in ROS section below Review of Systems  Constitutional:  Negative for activity change, appetite change, chills, fatigue, fever and unexpected weight change.  HENT:  Negative for hearing loss.   Eyes:  Negative for visual disturbance.  Respiratory:  Negative  for cough, chest tightness, shortness of breath and wheezing.   Cardiovascular:  Negative for chest pain, palpitations and leg swelling.  Gastrointestinal:  Negative for abdominal distention, abdominal pain, blood in stool, constipation, diarrhea, nausea and vomiting.  Genitourinary:  Negative for difficulty urinating and hematuria.  Musculoskeletal:  Negative for arthralgias, myalgias and neck pain.  Skin:  Negative for rash.  Neurological:  Negative for dizziness, seizures, syncope and headaches.  Hematological:  Negative for adenopathy. Does not bruise/bleed easily.  Psychiatric/Behavioral:  Negative for dysphoric mood. The patient is not nervous/anxious.     Objective:  BP (!) 144/72 (BP Location: Right Arm, Cuff Size: Large)   Pulse 65   Temp 97.8  F (36.6 C) (Oral)   Ht 5' 8 (1.727 m)   Wt 230 lb 4 oz (104.4 kg)   SpO2 93%   BMI 35.01 kg/m   Wt Readings from Last 3 Encounters:  10/21/23 230 lb 4 oz (104.4 kg)  10/09/23 232 lb (105.2 kg)  09/04/23 231 lb 4 oz (104.9 kg)      Physical Exam Vitals and nursing note reviewed.  Constitutional:      General: He is not in acute distress.    Appearance: Normal appearance. He is well-developed. He is not ill-appearing.  HENT:     Head: Normocephalic and atraumatic.     Right Ear: Hearing, tympanic membrane, ear canal and external ear normal.     Left Ear: Hearing, tympanic membrane, ear canal and external ear normal.     Mouth/Throat:     Mouth: Mucous membranes are moist.     Pharynx: Oropharynx is clear. No oropharyngeal exudate or posterior oropharyngeal erythema.  Eyes:     General: No scleral icterus.    Extraocular Movements: Extraocular movements intact.     Conjunctiva/sclera: Conjunctivae normal.     Pupils: Pupils are equal, round, and reactive to light.  Neck:     Thyroid : No thyroid  mass or thyromegaly.     Vascular: No carotid bruit.  Cardiovascular:     Rate and Rhythm: Normal rate and regular rhythm.      Pulses: Normal pulses.          Radial pulses are 2+ on the right side and 2+ on the left side.     Heart sounds: Normal heart sounds. No murmur heard. Pulmonary:     Effort: Pulmonary effort is normal. No respiratory distress.     Breath sounds: Normal breath sounds. No wheezing, rhonchi or rales.  Abdominal:     General: Bowel sounds are normal. There is no distension.     Palpations: Abdomen is soft. There is no mass.     Tenderness: There is no abdominal tenderness. There is no guarding or rebound.     Hernia: No hernia is present.  Musculoskeletal:        General: Normal range of motion.     Cervical back: Normal range of motion and neck supple.     Right lower leg: No edema.     Left lower leg: No edema.  Lymphadenopathy:     Cervical: No cervical adenopathy.  Skin:    General: Skin is warm and dry.     Findings: No rash.  Neurological:     General: No focal deficit present.     Mental Status: He is alert and oriented to person, place, and time.  Psychiatric:        Mood and Affect: Mood normal.        Behavior: Behavior normal.        Thought Content: Thought content normal.        Judgment: Judgment normal.       Results for orders placed or performed in visit on 10/21/23  POCT glycosylated hemoglobin (Hb A1C)   Collection Time: 10/21/23  9:16 AM  Result Value Ref Range   Hemoglobin A1C 5.8 (A) 4.0 - 5.6 %   HbA1c POC (<> result, manual entry)     HbA1c, POC (prediabetic range)     HbA1c, POC (controlled diabetic range)     Lab Results  Component Value Date   CHOL 94 07/19/2023   HDL 32.70 (L) 07/19/2023  LDLCALC 39 07/19/2023   LDLDIRECT 54.0 02/13/2021   TRIG 111.0 07/19/2023   CHOLHDL 3 07/19/2023   Lab Results  Component Value Date   NA 142 10/14/2023   CL 104 10/14/2023   K 5.1 10/14/2023   CO2 30 10/14/2023   BUN 18 10/14/2023   CREATININE 0.80 10/14/2023   GFR 86.60 10/14/2023   CALCIUM  9.8 10/14/2023   ALBUMIN 4.4 10/14/2023   GLUCOSE 123  (H) 10/14/2023    Lab Results  Component Value Date   WBC 7.6 10/14/2023   HGB 15.5 10/14/2023   HCT 44.8 10/14/2023   MCV 98.6 10/14/2023   PLT 238.0 10/14/2023    Lab Results  Component Value Date   VITAMINB12 939 (H) 07/19/2023    Assessment & Plan:   Problem List Items Addressed This Visit     Health maintenance examination - Primary (Chronic)   Preventative protocols reviewed and updated unless pt declined. Discussed healthy diet and lifestyle.       Advanced care planning/counseling discussion (Chronic)   Previously discussed, scanned 2018      HTN (hypertension)   Chronic, mildly elevated on current regimen - continue.       CAD (coronary artery disease), native coronary artery   Appreciate cards care.       Type 2 diabetes mellitus with other specified complication (HCC)   Chronic, stable. Continue low dose Trulicity .  POC A1c today.  RTC 6 mo DM f/u visit       Relevant Orders   POCT glycosylated hemoglobin (Hb A1C) (Completed)   Dyslipidemia associated with type 2 diabetes mellitus (HCC)   Chronic, stable period on atorvastatin  40mg  daily  The ASCVD Risk score (Arnett DK, et al., 2019) failed to calculate for the following reasons:   Risk score cannot be calculated because patient has a medical history suggesting prior/existing ASCVD       OSA (obstructive sleep apnea)   Not on CPAP Pending rpt HST followed by neurology .      Severe obesity (BMI 35.0-39.9) with comorbidity (HCC)   Reviewed healthy diet and lifestyle changes to effect sustainable weight loss.   Obesity complicated by comorbidities of OA, OSA, HTN      Elevated PSA   Chronic, mild, presumed due to BPH.  He will continue regularly seeing urology, states seen in the past 1-2 months.       Extramammary Paget's disease   Overdue - rec schedule yearly derm f/u.       Centrilobular emphysema (HCC)   Noted on imaging - stable period off respiratory medication.  Remote smoker.        Cerebrovascular small vessel disease   Noted on imaging.       Amnestic MCI (mild cognitive impairment with memory loss)   Appreciate neurology care - just started on namenda .       Decreased hearing of both ears   Low serum vitamin B12   Continue vit b12 MWF      Other Visit Diagnoses       Long-term (current) use of injectable non-insulin  antidiabetic drugs         Encounter for immunization       Relevant Orders   Flu vaccine HIGH DOSE PF(Fluzone Trivalent) (Completed)        No orders of the defined types were placed in this encounter.   Orders Placed This Encounter  Procedures   Flu vaccine HIGH DOSE PF(Fluzone Trivalent)   POCT glycosylated hemoglobin (Hb  A1C)    Patient Instructions  Flu shot today  Fingerstick A1c today Schedule appointment with dermatology as you're due - you previously saw Dr Gregorio at [facility].  We will request last note from Dr Nieves at [facility] urology.  Ok to take gabapentin  300mg  nightly if you feel it is helpful. This is a very low dose that you should tolerate well.  Good to see you today Return as needed or in 6 months for diabetes follow up visit   Follow up plan: Return in about 6 months (around 04/19/2024) for follow up visit.  Anton Blas, MD

## 2023-10-21 NOTE — Assessment & Plan Note (Signed)
 Overdue - rec schedule yearly derm f/u.

## 2023-10-21 NOTE — Assessment & Plan Note (Addendum)
 Not on CPAP Pending rpt HST followed by neurology .

## 2023-10-21 NOTE — Assessment & Plan Note (Signed)
 Noted on imaging

## 2023-10-21 NOTE — Assessment & Plan Note (Signed)
 Preventative protocols reviewed and updated unless pt declined. Discussed healthy diet and lifestyle.

## 2023-10-24 NOTE — Telephone Encounter (Signed)
 HST HTA shara: [id number] (exp. 10/10/23 to 01/08/24)

## 2023-10-28 ENCOUNTER — Encounter: Payer: Self-pay | Admitting: Pharmacist

## 2023-10-28 NOTE — Progress Notes (Signed)
 Pharmacy Quality Measure Review  This patient is appearing on a report for being at risk of failing the adherence measure for hypertension (ACEi/ARB) medications this calendar year.   Medication: losartan  100 mg Last fill date: 07/22/23 for 90 day supply  Insurance report was not up to date. No action needed at this time.  Medication has been refilled as of 10/21/23 x90ds.  1 additional 90d refill remaining.

## 2023-10-30 MED FILL — Dulaglutide Soln Auto-injector 0.75 MG/0.5ML: SUBCUTANEOUS | 28 days supply | Qty: 2 | Fill #4 | Status: AC

## 2023-11-20 MED FILL — Dulaglutide Soln Auto-injector 0.75 MG/0.5ML: SUBCUTANEOUS | 28 days supply | Qty: 2 | Fill #5 | Status: AC

## 2023-12-03 ENCOUNTER — Other Ambulatory Visit: Payer: Self-pay

## 2023-12-04 ENCOUNTER — Other Ambulatory Visit: Payer: Self-pay

## 2023-12-09 ENCOUNTER — Ambulatory Visit: Payer: Self-pay

## 2023-12-09 ENCOUNTER — Ambulatory Visit: Admitting: Family Medicine

## 2023-12-09 ENCOUNTER — Other Ambulatory Visit: Payer: Self-pay

## 2023-12-09 VITALS — BP 150/66 | HR 87 | Ht 68.0 in | Wt 237.0 lb

## 2023-12-09 DIAGNOSIS — S51812A Laceration without foreign body of left forearm, initial encounter: Secondary | ICD-10-CM

## 2023-12-09 DIAGNOSIS — S41102A Unspecified open wound of left upper arm, initial encounter: Secondary | ICD-10-CM | POA: Diagnosis not present

## 2023-12-09 DIAGNOSIS — W19XXXA Unspecified fall, initial encounter: Secondary | ICD-10-CM

## 2023-12-09 MED ORDER — DOXYCYCLINE HYCLATE 100 MG PO TABS
100.0000 mg | ORAL_TABLET | Freq: Two times a day (BID) | ORAL | 0 refills | Status: DC
  Filled 2023-12-09: qty 10, 5d supply, fill #0

## 2023-12-09 MED ORDER — BACITRACIN 500 UNIT/GM EX OINT
1.0000 | TOPICAL_OINTMENT | Freq: Two times a day (BID) | CUTANEOUS | 0 refills | Status: DC
  Filled 2023-12-09: qty 28, 16d supply, fill #0

## 2023-12-09 NOTE — Telephone Encounter (Signed)
 Noted. Appreciate Curtis Boom seeing pt today.

## 2023-12-09 NOTE — Patient Instructions (Addendum)
 Apply bacitracin to wound twice daily Keep wound covered with vaseline + gauze for three days, then can uncover

## 2023-12-09 NOTE — Telephone Encounter (Signed)
 FYI Only or Action Required?: Action required by provider: request for appointment.  Patient was last seen in primary care on 10/21/2023 by Rilla Baller, MD.  Called Nurse Triage reporting Fall.  Symptoms began yesterday.  Interventions attempted: Nothing.  Symptoms are: stable. Fell at home last night letting dogs out. Hit back and arm. Skin abrasion to arm.  Triage Disposition: See HCP Within 4 Hours (Or PCP Triage)  Patient/caregiver understands and will follow disposition?:       Copied from CRM [id number]. Topic: Clinical - Red Word Triage >> Dec 09, 2023  9:06 AM Victoria A wrote: [facility] that prompted transfer to Nurse Triage: Patient fell last night and has top layer of skin that is off and he hurt his back Reason for Disposition  [1] MODERATE weakness (e.g., interferes with work, school, normal activities) AND [2] new-onset or getting worse  Answer Assessment - Initial Assessment Questions 1. MECHANISM: How did the fall happen?     Clemens letting dogs out 2. DOMESTIC VIOLENCE AND ELDER ABUSE SCREENING: Did you fall because someone pushed you or tried to hurt you? If Yes, ask: Are you safe now?     no 3. ONSET: When did the fall happen? (e.g., minutes, hours, or days ago)     Last night 4. LOCATION: What part of the body hit the ground? (e.g., back, buttocks, head, hips, knees, hands, head, stomach)     Back and arm 5. INJURY: Did you hurt (injure) yourself when you fell? If Yes, ask: What did you injure? Tell me more about this? (e.g., body area; type of injury; pain severity)     yes 6. PAIN: Is there any pain? If Yes, ask: How bad is the pain? (e.g., Scale 0-10; or none, mild,      Hurts with walking 7. SIZE: For cuts, bruises, or swelling, ask: How large is it? (e.g., inches or centimeters)      abrasion 8. PREGNANCY: Is there any chance you are pregnant? When was your last menstrual period?     N/a 9. OTHER SYMPTOMS: Do you have any  other symptoms? (e.g., dizziness, fever, weakness; new-onset or worsening).      no 10. CAUSE: What do you think caused the fall (or falling)? (e.g., dizzy spell, tripped)       tripped  Protocols used: Falls and [facility]

## 2023-12-09 NOTE — Progress Notes (Signed)
 Acute Office Visit  Introduced to nurse practitioner role and practice setting.  All questions answered.  Discussed provider/patient relationship and expectations.   Subjective:     Patient ID: Casey Golden, male    DOB: [birth year], 73 y.o.   MRN: [id number]  Chief Complaint  Patient presents with  . Fall    Patient fell backwards this morning. He his his arm on a dresser drawer, and landed flat on his back.  States it knocked the wind out of him.  He denies any head injury.  He states that the fall was the result of opening the door to let the dogs out and then he said his feet just went out from him.      Discussed the use of AI scribe software for clinical note transcription with the patient, who gave verbal consent to proceed.  History of Present Illness Casey Golden is a 73 year old male who presents with a fall resulting in a skin injury to the arm.  He fell while trying to let his dog out, wearing only socks, which caused him to slip and fall in his kitchen. During the fall, his arm hit an open drawer, resulting in a skin injury. He describes the injury as having 'peeled the skin off' and is concerned about the risk of infection. No head injury occurred during the fall.  He is currently taking aspirin  and was previously on a blood thinner, but he is unsure if he is still taking it. He confirms that his medication list is up to date and only includes aspirin .  He reports back pain following the fall, which was significant last night but has improved since then. He experienced some tingling and numbness in the morning, which improved with movement. He took gabapentin  for the back pain, which he takes daily, and it helped alleviate the pain. No weakness in his legs and normal bowel and bladder function.  Fall The accident occurred 12 to 24 hours ago. The fall occurred while walking.    ROS      Objective:    BP (!) 150/66 (BP Location: Left Arm, Patient Position:  Sitting, Cuff Size: Normal)   Pulse 87   Ht 5' 8 (1.727 m)   Wt 237 lb (107.5 kg)   SpO2 98%   BMI 36.04 kg/m  {Vitals History (Optional):23777}  Physical Exam Constitutional:      General: He is not in acute distress.    Appearance: Normal appearance. He is not ill-appearing, toxic-appearing or diaphoretic.  Musculoskeletal:        General: Normal range of motion.     Comments: At baseline, present kyphosis  Skin:    Capillary Refill: Capillary refill takes less than 2 seconds.     Findings: Wound present.     Comments: Left forearm skin tear  Neurological:     General: No focal deficit present.     Mental Status: He is alert and oriented to person, place, and time. Mental status is at baseline.  Psychiatric:        Mood and Affect: Mood normal.        Behavior: Behavior normal.        Thought Content: Thought content normal.        Judgment: Judgment normal.     No results found for any visits on 12/09/23.      Assessment & Plan:  Assessment and Plan Assessment & Plan Open wound and laceration of left upper  extremity after fall Acute open wound and laceration on the left upper extremity following a fall. He is on aspirin , which may increase bleeding risk. Concern for potential infection due to the nature of the wound and the environment in which it occurred. - Prescribed antibiotics to prevent infection  Back pain after fall Back pain following a fall, initially severe but improving. Reports some tingling and numbness, especially in the morning, but symptoms improve with movement. No significant weakness or neurological deficits. Gabapentin  taken for pain management. - Ordered x-ray of the back to rule out injury - Advised to report if pain worsens for potential further intervention     Problem List Items Addressed This Visit   None Visit Diagnoses       Arm wound, left, initial encounter    -  Primary   Relevant Medications   bacitracin 500 UNIT/GM ointment    doxycycline  (VIBRA -TABS) 100 MG tablet       Meds ordered this encounter  Medications  . bacitracin 500 UNIT/GM ointment    Sig: Apply 1 Application topically 2 (two) times daily.    Dispense:  28 g    Refill:  0  . doxycycline  (VIBRA -TABS) 100 MG tablet    Sig: Take 1 tablet (100 mg total) by mouth 2 (two) times daily.    Dispense:  10 tablet    Refill:  0    Return if symptoms worsen or fail to improve.  Curtis DELENA Boom, FNP  I, Curtis DELENA Boom, FNP, have reviewed all documentation for this visit. The documentation on 12/09/23 for the exam, diagnosis, procedures, and orders are all accurate and complete.

## 2023-12-10 ENCOUNTER — Encounter: Payer: Self-pay | Admitting: Family Medicine

## 2023-12-11 ENCOUNTER — Encounter: Payer: Self-pay | Admitting: Family Medicine

## 2023-12-11 ENCOUNTER — Ambulatory Visit: Admitting: Family Medicine

## 2023-12-11 ENCOUNTER — Other Ambulatory Visit: Payer: Self-pay

## 2023-12-11 VITALS — BP 150/72 | HR 65 | Temp 98.1°F | Ht 68.0 in | Wt 232.8 lb

## 2023-12-11 DIAGNOSIS — W19XXXD Unspecified fall, subsequent encounter: Secondary | ICD-10-CM

## 2023-12-11 DIAGNOSIS — M545 Low back pain, unspecified: Secondary | ICD-10-CM | POA: Diagnosis not present

## 2023-12-11 DIAGNOSIS — S51812A Laceration without foreign body of left forearm, initial encounter: Secondary | ICD-10-CM | POA: Insufficient documentation

## 2023-12-11 DIAGNOSIS — R0789 Other chest pain: Secondary | ICD-10-CM | POA: Diagnosis not present

## 2023-12-11 DIAGNOSIS — S51812D Laceration without foreign body of left forearm, subsequent encounter: Secondary | ICD-10-CM

## 2023-12-11 DIAGNOSIS — S41102A Unspecified open wound of left upper arm, initial encounter: Secondary | ICD-10-CM

## 2023-12-11 MED ORDER — DOXYCYCLINE HYCLATE 100 MG PO TABS
100.0000 mg | ORAL_TABLET | Freq: Two times a day (BID) | ORAL | 0 refills | Status: DC
  Filled 2023-12-11 – 2023-12-13 (×3): qty 10, 5d supply, fill #0
  Filled ????-??-??: fill #0

## 2023-12-11 NOTE — Patient Instructions (Signed)
 Start famotidine  twice daily.  Avoid acid triggers.  Can use tylenol  ES three times daily   Call cardiology for follow up.. mention recent more frequent non exertional chest pain.  Increase doxycycline  to twice daily  Stop peroxide and wash wound with warm soapy water.

## 2023-12-11 NOTE — Assessment & Plan Note (Signed)
 Acute, improving but some concern for associated cellulitis.  Patient has not been taking doxycycline  twice daily but instead daily.  Encouraged him to increase to twice daily and will extend course for full 10 days.  Wound rebandaged in office.  Encouraged patient to stop peroxide and start washing with warm soapy water daily with antibacterial soap.  Continue antibiotic ointment and daily dressing changes.

## 2023-12-11 NOTE — Progress Notes (Addendum)
 Patient ID: Casey Golden, male    DOB: 02-03-1950, 73 y.o.   MRN: [id number]  This visit was conducted in person.  BP (!) 150/72   Pulse 65   Temp 98.1 F (36.7 C) (Oral)   Ht 5' 8 (1.727 m)   Wt 232 lb 12.8 oz (105.6 kg)   SpO2 98%   BMI 35.40 kg/m    CC:  Chief Complaint  Patient presents with   Fall    Had a fall Sunday. Hurt back and arm.     Subjective:   HPI: Casey Golden is a 73 y.o. male presenting on 12/11/2023 for Fall (Had a fall Sunday. Hurt back and arm. )     Presents for follow up Fall  occurring 4 days ago, accidental,  no proceeding symptoms. Evaluated at [facility]  11/10.SABRA reviewed note in detial   Resulted in skin tear on left forearm Treated with doxycycline  5 days preventative... only taking once daily    He has been  applying Baitracin daily. Washing daily with  peroxide.  Keeps bandage on in shower... changing bandage.  No change in pain.   He reports back pain is improving.. just stiff.  No focal.  No new numbness, no weakness.   Pt reports some intermittent  chest tightness.  Was given imdur  in past.. self stopped given concern of SE.   Has extensive history of CAD.     Relevant past medical, surgical, family and social history reviewed and updated as indicated. Interim medical history since our last visit reviewed. Allergies and medications reviewed and updated. Outpatient Medications Prior to Visit  Medication Sig Dispense Refill   Accu-Chek Softclix Lancets lancets Use to check blood glucose 3 times a day 100 each 0   amLODipine  (NORVASC ) 5 MG tablet Take 1 tablet (5 mg total) by mouth daily. 180 tablet 3   aspirin  81 MG EC tablet Take 1 tablet (81 mg total) by mouth daily. 30 tablet 12   atorvastatin  (LIPITOR) 40 MG tablet Take 1 tablet (40 mg total) by mouth daily. 90 tablet 3   bacitracin 500 UNIT/GM ointment Apply 1 Application topically 2 (two) times daily. 28 g 0   Blood Glucose Monitoring Suppl  (BLOOD GLUCOSE MONITOR SYSTEM) w/Device KIT Use to check blood sugar 3 (three) times daily. 1 kit 0   cyanocobalamin  (VITAMIN B12) 1000 MCG tablet Take 1 tablet (1,000 mcg total) by mouth every Monday, Wednesday, and Friday.     diclofenac  sodium (VOLTAREN ) 1 % GEL APPLY 2 G TOPICALLY 3 (THREE) TIMES DAILY AS NEEDED (ANTI INFLAMMATORY). 100 g 1   Dulaglutide  (TRULICITY ) 0.75 MG/0.5ML SOAJ Inject 0.75 mg into the skin once a week. 2 mL 6   furosemide  (LASIX ) 20 MG tablet Take 1 tablet (20 mg total) by mouth daily. 30 tablet 5   gabapentin  (NEURONTIN ) 300 MG capsule Take 1 capsule (300 mg total) by mouth at bedtime. 30 capsule 3   glucose blood test strip Use to check blood glucose 3 times a day 100 each 0   Lancets (ONETOUCH DELICA PLUS LANCET33G) MISC Use to check blood glucose 3 (three) times daily. 100 each 0   losartan  (COZAAR ) 100 MG tablet Take 1 tablet (100 mg total) by mouth daily. 90 tablet 3   memantine  (NAMENDA ) 5 MG tablet Take 1 tablet (5 mg total) by mouth 2 (two) times daily. 60 tablet 5   Multiple Vitamin (MULTIVITAMIN ADULT) TABS Take 1 tablet by mouth daily.  nitroGLYCERIN  (NITROSTAT ) 0.4 MG SL tablet Place 1 tablet (0.4 mg total) under the tongue every 5 (five) minutes as needed for chest pain (do not take more than 3 doses.). 50 tablet 0   vitamin E  1000 UNIT capsule Take 1 capsule (1,000 Units total) by mouth in the morning and at bedtime.     doxycycline  (VIBRA -TABS) 100 MG tablet Take 1 tablet (100 mg total) by mouth 2 (two) times daily. 10 tablet 0   No facility-administered medications prior to visit.     Per HPI unless specifically indicated in ROS section below Review of Systems  Constitutional:  Negative for fatigue and fever.  HENT:  Negative for ear pain.   Eyes:  Negative for pain.  Respiratory:  Negative for cough and shortness of breath.   Cardiovascular:  Negative for chest pain, palpitations and leg swelling.  Gastrointestinal:  Negative for abdominal  pain.  Genitourinary:  Negative for dysuria.  Musculoskeletal:  Negative for arthralgias.  Neurological:  Negative for syncope, light-headedness and headaches.  Psychiatric/Behavioral:  Negative for dysphoric mood.    Objective:  BP (!) 150/72   Pulse 65   Temp 98.1 F (36.7 C) (Oral)   Ht 5' 8 (1.727 m)   Wt 232 lb 12.8 oz (105.6 kg)   SpO2 98%   BMI 35.40 kg/m   Wt Readings from Last 3 Encounters:  12/11/23 232 lb 12.8 oz (105.6 kg)  12/09/23 237 lb (107.5 kg)  10/21/23 230 lb 4 oz (104.4 kg)      Physical Exam Constitutional:      Appearance: He is well-developed.  HENT:     Head: Normocephalic.     Right Ear: Hearing normal.     Left Ear: Hearing normal.     Nose: Nose normal.  Neck:     Thyroid : No thyroid  mass or thyromegaly.     Vascular: No carotid bruit.     Trachea: Trachea normal.  Cardiovascular:     Rate and Rhythm: Normal rate and regular rhythm.     Pulses: Normal pulses.     Heart sounds: Heart sounds not distant. No murmur heard.    No friction rub. No gallop.     Comments: No peripheral edema Pulmonary:     Effort: Pulmonary effort is normal. No respiratory distress.     Breath sounds: Normal breath sounds.  Chest:     Comments: Central chest pressure over sternum Musculoskeletal:     Right lower leg: No edema.     Left lower leg: No edema.  Skin:    General: Skin is warm and dry.     Findings: No rash.         Comments: Skin tear, left forearm..  Moderate surrounding erythema, yellowish slough over lesion.  Slough removed and granulation tissue noted,  No odor  Psychiatric:        Speech: Speech normal.        Behavior: Behavior normal.        Thought Content: Thought content normal.       Results for orders placed or performed in visit on 10/21/23  POCT glycosylated hemoglobin (Hb A1C)   Collection Time: 10/21/23  9:16 AM  Result Value Ref Range   Hemoglobin A1C 5.8 (A) 4.0 - 5.6 %   HbA1c POC (<> result, manual entry)     HbA1c,  POC (prediabetic range)     HbA1c, POC (controlled diabetic range)      Assessment and Plan  Fall,  subsequent encounter Assessment & Plan: Recent accidental fall.  Discussed safe footwear and fall precautions. No preceding symptoms   Acute midline low back pain without sciatica Assessment & Plan: Acute flare of chronic issue Recommended gentle stretching of low back.  Can use Tylenol  extra strength 2 tablets 3 times daily as needed for pain. Can use heat.   Skin tear of left forearm without complication, subsequent encounter Assessment & Plan: Acute, improving but some concern for associated cellulitis.  Patient has not been taking doxycycline  twice daily but instead daily.  Encouraged him to increase to twice daily and will extend course for full 10 days.  Wound rebandaged in office.  Encouraged patient to stop peroxide and start washing with warm soapy water daily with antibacterial soap.  Continue antibiotic ointment and daily dressing changes.   Arm wound, left, initial encounter -     Doxycycline  Hyclate; Take 1 tablet (100 mg total) by mouth 2 (two) times daily.  Dispense: 10 tablet; Refill: 0  Atypical chest pain Assessment & Plan: Acute worsening of chronic issue. Symptoms do not sound exactly anginal given they are not exertional.  He also has tenderness in his anterior chest to palpation. Chest pain likely multifactorial, in part due to recent fall and musculoskeletal strain, also some element of possible reflux. Recommend Tylenol  extra strength 3 times daily as needed for pain.  Start famotidine twice daily for possible reflux and avoid triggers.  Given extensive cardiac history if symptoms are not improving, recommend follow-up with cardiology.     No follow-ups on file.   Greig Ring, MD

## 2023-12-11 NOTE — Assessment & Plan Note (Signed)
 Acute flare of chronic issue Recommended gentle stretching of low back.  Can use Tylenol  extra strength 2 tablets 3 times daily as needed for pain. Can use heat.

## 2023-12-11 NOTE — Assessment & Plan Note (Signed)
 Recent accidental fall.  Discussed safe footwear and fall precautions. No preceding symptoms

## 2023-12-11 NOTE — Addendum Note (Signed)
 Addended by: AVELINA NO E on: 12/11/2023 01:00 PM   Modules accepted: Orders, Level of Service

## 2023-12-11 NOTE — Assessment & Plan Note (Signed)
 Acute worsening of chronic issue. Symptoms do not sound exactly anginal given they are not exertional.  He also has tenderness in his anterior chest to palpation. Chest pain likely multifactorial, in part due to recent fall and musculoskeletal strain, also some element of possible reflux. Recommend Tylenol  extra strength 3 times daily as needed for pain.  Start famotidine twice daily for possible reflux and avoid triggers.  Given extensive cardiac history if symptoms are not improving, recommend follow-up with cardiology.

## 2023-12-12 ENCOUNTER — Other Ambulatory Visit: Payer: Self-pay

## 2023-12-13 ENCOUNTER — Other Ambulatory Visit: Payer: Self-pay

## 2023-12-16 ENCOUNTER — Encounter: Payer: Self-pay | Admitting: Pharmacist

## 2023-12-16 NOTE — Progress Notes (Signed)
 Pharmacy Quality Measure Review  This patient is appearing on a report for being at risk of failing the Controlling Blood Pressure measure this calendar year.   Last documented BP  BP Readings from Last 1 Encounters:  12/11/23 (!) 150/72   does not meet criteria for measure closure (BP <140/90).   KED - pass Last documented UACR, GFR Lab Results  Component Value Date   MICRALBCREAT Unable to calculate 10/14/2023   Lab Results  Component Value Date   GFR 86.60 10/14/2023    SUPD: Pass  2025 f/u scheduled: NO

## 2023-12-18 MED FILL — Dulaglutide Soln Auto-injector 0.75 MG/0.5ML: SUBCUTANEOUS | 28 days supply | Qty: 2 | Fill #6 | Status: AC

## 2023-12-20 ENCOUNTER — Other Ambulatory Visit: Payer: Self-pay

## 2023-12-24 ENCOUNTER — Other Ambulatory Visit: Payer: Self-pay | Admitting: Family Medicine

## 2023-12-25 ENCOUNTER — Other Ambulatory Visit: Payer: Self-pay

## 2023-12-25 MED ORDER — GABAPENTIN 300 MG PO CAPS
300.0000 mg | ORAL_CAPSULE | Freq: Every day | ORAL | 1 refills | Status: DC
  Filled 2023-12-25: qty 90, 90d supply, fill #0
  Filled 2024-02-18: qty 90, 90d supply, fill #1

## 2023-12-25 NOTE — Telephone Encounter (Signed)
 ERx

## 2023-12-28 ENCOUNTER — Other Ambulatory Visit: Payer: Self-pay

## 2023-12-30 ENCOUNTER — Other Ambulatory Visit: Payer: Self-pay

## 2024-01-06 LAB — OPHTHALMOLOGY REPORT-SCANNED

## 2024-01-09 ENCOUNTER — Telehealth: Payer: Self-pay

## 2024-01-09 NOTE — Telephone Encounter (Addendum)
 I did not call him nor do I see where our office reached out to him. Plz check to see if he has any concerns

## 2024-01-09 NOTE — Telephone Encounter (Signed)
 Copied from CRM #[id number]. Topic: General - Other >> Jan 09, 2024 10:18 AM Emylou G wrote: Reason for CRM: Patient called.. said Dr had called him?  Pls return call >> Jan 09, 2024  3:29 PM Roselie BROCKS wrote: Patient calling back to speak to PCP, said he is retuning a call from the provider . And requests a return call when available.

## 2024-01-13 ENCOUNTER — Other Ambulatory Visit: Payer: Self-pay

## 2024-01-13 ENCOUNTER — Other Ambulatory Visit: Payer: Self-pay | Admitting: Family Medicine

## 2024-01-13 DIAGNOSIS — E1169 Type 2 diabetes mellitus with other specified complication: Secondary | ICD-10-CM

## 2024-01-14 NOTE — Telephone Encounter (Signed)
 Left message to return call to our office.

## 2024-01-15 ENCOUNTER — Other Ambulatory Visit: Payer: Self-pay | Admitting: Family Medicine

## 2024-01-15 ENCOUNTER — Other Ambulatory Visit: Payer: Self-pay

## 2024-01-15 DIAGNOSIS — E1169 Type 2 diabetes mellitus with other specified complication: Secondary | ICD-10-CM

## 2024-01-15 MED FILL — Dulaglutide Soln Auto-injector 0.75 MG/0.5ML: SUBCUTANEOUS | 28 days supply | Qty: 2 | Fill #0 | Status: AC

## 2024-01-15 NOTE — Telephone Encounter (Signed)
 Left message to return call to our office.

## 2024-01-16 ENCOUNTER — Other Ambulatory Visit: Payer: Self-pay

## 2024-01-16 ENCOUNTER — Other Ambulatory Visit: Payer: Self-pay | Admitting: Family Medicine

## 2024-01-16 DIAGNOSIS — S41102A Unspecified open wound of left upper arm, initial encounter: Secondary | ICD-10-CM

## 2024-01-17 ENCOUNTER — Other Ambulatory Visit: Payer: Self-pay

## 2024-01-17 NOTE — Addendum Note (Signed)
 Addended by: SEBASTIAN DANNA GRADE on: 01/17/2024 12:58 PM   Modules accepted: Orders

## 2024-01-17 NOTE — Telephone Encounter (Signed)
 Was able to reach patient. He states he will need refill has one injection for this week and he will be out. States he is doing good on the 0.75mg . would like called into [facility]

## 2024-01-17 NOTE — Telephone Encounter (Unsigned)
 Copied from CRM #[id number]. Topic: General - Other >> Jan 16, 2024 12:06 PM Nessti S wrote: Reason for CRM: pt returning call to nurse joellen. He would like a call back.

## 2024-01-20 ENCOUNTER — Other Ambulatory Visit: Payer: Self-pay

## 2024-01-20 MED FILL — Bacitracin Oint 500 Unit/GM: CUTANEOUS | 30 days supply | Qty: 28 | Fill #0 | Status: AC

## 2024-01-20 NOTE — Telephone Encounter (Signed)
 Bacitracin  oint Last filled:  12/09/23, #28 g Last OV:  10/21/23, annual exam; 12/11/23, arm wound (Dr Avelina) Next OV:  04/20/24, 6 mo f/u

## 2024-01-20 NOTE — Telephone Encounter (Signed)
Pt informed; no further action needed.

## 2024-01-20 NOTE — Telephone Encounter (Signed)
 Please notify refills for 6 months were sent in last week to [facility] pharmacy so he should have this available to him if he calls that pharmacy to request refills.

## 2024-01-24 ENCOUNTER — Other Ambulatory Visit: Payer: Self-pay

## 2024-02-05 ENCOUNTER — Other Ambulatory Visit: Payer: Self-pay

## 2024-02-11 NOTE — Progress Notes (Unsigned)
 "  Cardiology Office Note    Date:  02/14/2024   ID:  Casey Golden, DOB 1950-11-04, MRN [id number]  PCP:  Rilla Baller, MD  Cardiologist:  Evalene Lunger, MD  Electrophysiologist:  None   Chief Complaint: Follow up  History of Present Illness:   Casey Golden is a 74 y.o. male with history of nonobstructive CAD by [facility] in 2018, 2009, 01/2021 and 04/2023 with chronic chest pain, diastolic dysfunction, DM2, HTN, HLD, syncope, prior tobacco use quitting in 1990, chronic low back pain, osteoarthritis, and extramammary Paget's disease of the scrotum who presents for follow-up of nonobstructive CAD.   Longstanding history of nonobstructive CAD with positive nuclear stress test leading to cath 04/2006 showing 50% mid RCA disease, and 30% LAD disease, and a normal LVEF. Cath 11/2007 with 40% mid RCA disease, normal LVEF. Multiple ED visits in late 2022/early 2023 for recurrent chest pain.  LHC in 01/2021 showed mild to moderate, nonobstructive CAD including long segment of mid LAD stenosis of up to 30 to 40% with 20% ostial D2 stenosis, and 20 to 30% proximal RCA stenosis.  Hyperdynamic LV systolic function with mildly elevated LVEDP.  Medical therapy was recommended.  He was seen in the ED in 03/2023 with chest discomfort provoked by walking in his yard.  High-sensitivity troponin negative x 2.  Chest x-ray without acute cardiopulmonary process.  EKG without acute ischemic changes.  He followed up in the office on 04/16/2023 and reported intermittent episodes of centrally located chest pressure.  Coronary CTA was recommended.  However, before this could be undertaken he was seen in the office with continued intermittent chest pain concerning for angina that was occurring more frequently and more intense.  In this setting he underwent LHC in 04/2023 that showed mild to moderate, nonobstructive CAD that was not significantly changed from cath in 01/2021.  Moderately elevated LVEDP estimated at 25 mmHg.  Echo  in 04/2023 showed an EF of 60 to 65%, no regional wall motion abnormalities, grade 1 diastolic dysfunction, normal RV systolic function and ventricular cavity size, no significant valvular abnormalities, and an estimated right atrial pressure of 3 mmHg.  He was most recently seen in the office in 07/2023 and was doing well from a cardiac perspective, without symptoms of angina or cardiac decompensation.  He reported multiple noncardiac complaints after self discontinuing gabapentin  and Aricept .  No changes in cardiac pharmacotherapy were indicated at that time.  He comes in today and reports an approximate 30-day history of intermittent chest tightness that is randomly occurring that was more pronounced approximately 2 weeks ago and has improved some since.  With this, there was some associated shortness of breath that was more pronounced when his chest was tighter and improved more recently.  No dizziness, presyncope, or syncope.  No lower extremity swelling.  No falls or symptoms concerning for bleeding.  He reports he wonders if his chest tightness is his heart or lungs.  Not checking blood pressure at home.   Labs independently reviewed: 09/2023 - A1c 5.8, potassium 5.1, BUN 18, serum creatinine 0.8, albumin 4.4, AST/ALT normal, TSH normal, Hgb 15.5, PLT 238 06/2023 - TC 94, TG 111, HDL 32, LDL 39  Past Medical History:  Diagnosis Date   Arthritis    CAD (coronary artery disease)    cathx3 with nonobstructive disease.   Cancer [facility])    Cataract 2019   bilateral; resolved with surgery   Colonic polyp    Fatty liver disease, nonalcoholic 2015  by US    GERD (gastroesophageal reflux disease)    History of chicken pox    Hyperlipidemia    Hypertension    Nocturia    OSA (obstructive sleep apnea)    CPAP, compliant   Paget's disease of bony pelvis    Past use of tobacco    Quit 1990, 70 pack year history   Rash of genital area    09-08-2014  per pt Dr Nieves aware   Seasonal and  perennial allergic rhinitis    Stroke (HCC)    Type 2 diabetes mellitus (HCC)    Wears dentures    full upper/  partial lower   Wears glasses     Past Surgical History:  Procedure Laterality Date   ABDOMINAL HERNIA REPAIR  2007      [facility]   open repair   APPENDECTOMY  age 83   CARDIAC CATHETERIZATION  12-23-2007   [facility]   Abnormal myoview w/ ischemia/  40% mRCA with nonobstructive and no sig. plaque in his left system, EF 55%   CARDIAC CATHETERIZATION  Apr 2008    [facility]   Abnormal myoview/  50% RCA,  ef 65%   CARDIAC CATHETERIZATION  1999      [facility]   CATARACT EXTRACTION, BILATERAL Bilateral 11/2017   CERVICAL FUSION  1992   CHOLECYSTECTOMY OPEN  2006   COLONOSCOPY WITH PROPOFOL  N/A 04/17/2016   TAs, high grade dysplasia with margins clear, diverticulosis [name], MD)   COLONOSCOPY WITH PROPOFOL  N/A 08/07/2016   TAx1, diverticulosis, rpt 3 yrs ([facility])   COLONOSCOPY WITH PROPOFOL  N/A 01/05/2020   SSP, rpt [name], Darren, MD)   DENTAL SURGERY     metal dental implant L mandible   EXCISION OF SKIN TAG Right 09/14/2014   Procedure: EXCISION OF SKIN TAG;  Surgeon: Donnice Nieves, MD;  Location: [facility];  Service: Urology;  Laterality: Right;   GROIN MASS OPEN BIOPSY Left 01/17/2019   HYDROCELE EXCISION Left 09/14/2014   Procedure: LEFT HYDROCELECTOMY ADULT;  Surgeon: Donnice Nieves, MD;  Location: [facility];  Service: Urology;  Laterality: Left;   LEFT HEART CATH AND CORONARY ANGIOGRAPHY Left 02/15/2021   Procedure: LEFT HEART CATH AND CORONARY ANGIOGRAPHY;  Surgeon: Mady Bruckner, MD;  Location: [facility] INVASIVE CV LAB;  Service: Cardiovascular;  Laterality: Left;   LEFT HEART CATH AND CORONARY ANGIOGRAPHY Left 05/21/2023   Procedure: LEFT HEART CATH AND CORONARY ANGIOGRAPHY;  Surgeon: Mady Bruckner, MD;  Location: [facility] INVASIVE CV LAB;  Service: Cardiovascular;  Laterality: Left;   MOHS SURGERY  2015   skin cancer   TONSILLECTOMY  age 54     Current Medications: Active Medications[1]  Allergies:   Aricept  [donepezil ], Elemental sulfur, Imdur  [isosorbide  nitrate], and Sulfa antibiotics   Social History   Socioeconomic History   Marital status: Married    Spouse name: Not on file   Number of children: 3   Years of education: Not on file   Highest education level: 12th grade  Occupational History   Occupation: Full time    Employer: [employer]  Tobacco Use   Smoking status: Former    Current packs/day: 0.00    Average packs/day: 2.0 packs/day for 35.0 years (70.0 ttl pk-yrs)    Types: Cigarettes    Start date: 01/30/1955    Quit date: 01/29/1990    Years since quitting: 34.0   Smokeless tobacco: Never   Tobacco comments:    States quit 01/29/1997  Vaping Use  Vaping status: Never Used  Substance and Sexual Activity   Alcohol use: No   Drug use: No   Sexual activity: Yes  Other Topics Concern   Not on file  Social History Narrative   Lives with wife, dogs and cats    Occupation: retired, was self employed, now works at [employer]    Edu: HS   Activity: walks 1.5 mi daily   Diet: some water, fruits/vegetables daily      Social Drivers of Health   Tobacco Use: Medium Risk (02/14/2024)   Patient History    Smoking Tobacco Use: Former    Smokeless Tobacco Use: Never    Passive Exposure: Not on Actuary Strain: Low Risk (07/22/2023)   Overall Financial Resource Strain (CARDIA)    Difficulty of Paying Living Expenses: Not hard at all  Food Insecurity: No Food Insecurity (07/22/2023)   Epic    Worried About Radiation Protection Practitioner of Food in the Last Year: Never true    Ran Out of Food in the Last Year: Never true  Transportation Needs: No Transportation Needs (07/22/2023)   Epic    Lack of Transportation (Medical): No    Lack of Transportation (Non-Medical): No  Physical Activity: Sufficiently Active (07/22/2023)   Exercise Vital Sign    Days of Exercise per Week: 5 days    Minutes of  Exercise per Session: 30 min  Stress: No Stress Concern Present (07/22/2023)   [employer] of Occupational Health - Occupational Stress Questionnaire    Feeling of Stress: Not at all  Social Connections: Moderately Isolated (07/22/2023)   Social Connection and Isolation Panel    Frequency of Communication with Friends and Family: More than three times a week    Frequency of Social Gatherings with Friends and Family: More than three times a week    Attends Religious Services: Patient declined    Active Member of Clubs or Organizations: No    Attends Banker Meetings: Never    Marital Status: Married  Depression (PHQ2-9): Low Risk (12/09/2023)   Depression (PHQ2-9)    PHQ-2 Score: 4  Alcohol Screen: Low Risk (07/18/2023)   Alcohol Screen    Last Alcohol Screening Score (AUDIT): 0  Housing: Low Risk (07/22/2023)   Epic    Unable to Pay for Housing in the Last Year: No    Number of Times Moved in the Last Year: 0    Homeless in the Last Year: No  Utilities: Not At Risk (07/22/2023)   Epic    Threatened with loss of utilities: No  Health Literacy: Adequate Health Literacy (07/22/2023)   B1300 Health Literacy    Frequency of need for help with medical instructions: Never     Family History:  The patient's family history includes CAD in his mother; Cancer in his sister; Cancer (age of onset: 53) in his father; Diabetes in his mother and paternal grandfather; Heart disease in his paternal grandfather and paternal grandmother; Stroke in his mother. There is no history of Coronary artery disease, Sleep apnea, or Dementia.  ROS:   12-point review of systems is negative unless otherwise noted in the HPI.   EKGs/Labs/Other Studies Reviewed:    Studies reviewed were summarized above. The additional studies were reviewed today:  LHC 02/15/2021:   2nd Diag lesion is 20% stenosed.   Mid LAD to Dist LAD lesion is 35% stenosed.   Prox RCA lesion is 25% stenosed.   There is  hyperdynamic left ventricular systolic  function.   LV end diastolic pressure is mildly elevated.   The left ventricular ejection fraction is greater than 65% by visual estimate.   There is no aortic valve stenosis.   Conclusions: Mild-moderate, non-obstructive coronary artery disease, including long segment of mid LAD stenosis of up to 30-40%, 20% ostial D2 stenosis, and 20-30% proximal RCA lesion. Hyperdynamic left ventricular systolic function with mildly elevated filling pressure (LVEDP ~20 mmHg).   Recommendations: No significant coronary lesion to explain accelerating angina.  Will add isosorbide  mononitrate 15 mg daily. Medical therapy and risk factor modification to prevent progression of disease. __________   [facility] 05/21/2023: Conclusions: Mild-moderate, nonobstructive coronary artery disease, as detailed below.  No significant change from prior catheterization in 01/2021. Moderately elevated left ventricular filling pressure (LVEDP 25 mmHg).   Recommendations: Initiate furosemide  20 mg daily with BMP at follow-up. If chest pain persists and/or patient has continued hypertension, addition of amlodipine  should be considered for blood pressure control and antianginal therapy. Obtain echocardiogram. __________   2D echo 05/23/2023: 1. Left ventricular ejection fraction, by estimation, is 60 to 65%. Left  ventricular ejection fraction by PLAX is 64 %. The left ventricle has  normal function. The left ventricle has no regional wall motion  abnormalities. Left ventricular diastolic  parameters are consistent with Grade I diastolic dysfunction (impaired  relaxation).   2. Right ventricular systolic function is normal. The right ventricular  size is normal.   3. The mitral valve is normal in structure. No evidence of mitral valve  regurgitation. No evidence of mitral stenosis.   4. The aortic valve is normal in structure. Aortic valve regurgitation is  not visualized. Aortic valve  sclerosis is present, with no evidence of  aortic valve stenosis.   5. The inferior vena cava is normal in size with greater than 50%  respiratory variability, suggesting right atrial pressure of 3 mmHg.    EKG:  EKG is ordered today.  The EKG ordered today demonstrates NSR, 73 bpm, nonspecific ST-T changes, consistent with prior tracing  Recent Labs: 10/14/2023: ALT 32; BUN 18; Creatinine, Ser 0.80; Hemoglobin 15.5; Platelets 238.0; Potassium 5.1; Sodium 142; TSH 0.97  Recent Lipid Panel    Component Value Date/Time   CHOL 94 07/19/2023 0754   CHOL 179 07/21/2014 0935   TRIG 111.0 07/19/2023 0754   TRIG 192 10/03/2007 0000   HDL 32.70 (L) 07/19/2023 0754   HDL 31 (L) 07/21/2014 0935   CHOLHDL 3 07/19/2023 0754   VLDL 22.2 07/19/2023 0754   LDLCALC 39 07/19/2023 0754   LDLCALC 74 07/21/2014 0935   LDLDIRECT 54.0 02/13/2021 0852    PHYSICAL EXAM:    VS:  BP (!) 160/75 (BP Location: Left Arm, Patient Position: Sitting, Cuff Size: Normal)   Pulse 73 Comment: 78 oximeter  Ht 5' 9 (1.753 m)   Wt 238 lb (108 kg)   SpO2 98%   BMI 35.15 kg/m   BMI: Body mass index is 35.15 kg/m.  Physical Exam Vitals reviewed.  Constitutional:      Appearance: He is well-developed.  HENT:     Head: Normocephalic and atraumatic.  Eyes:     General:        Right eye: No discharge.        Left eye: No discharge.  Cardiovascular:     Rate and Rhythm: Normal rate and regular rhythm.     Heart sounds: Normal heart sounds, S1 normal and S2 normal. Heart sounds not distant. No midsystolic click and  no opening snap. No murmur heard.    No friction rub.     Comments: Chest pain is not reproducible to palpation. Pulmonary:     Effort: Pulmonary effort is normal. No respiratory distress.     Breath sounds: Normal breath sounds. No decreased breath sounds, wheezing, rhonchi or rales.  Musculoskeletal:     Cervical back: Normal range of motion.     Right lower leg: No edema.     Left lower leg: No  edema.  Skin:    General: Skin is warm and dry.     Nails: There is no clubbing.  Neurological:     Mental Status: He is alert and oriented to person, place, and time.  Psychiatric:        Speech: Speech normal.        Behavior: Behavior normal.        Thought Content: Thought content normal.        Judgment: Judgment normal.     Wt Readings from Last 3 Encounters:  02/14/24 238 lb (108 kg)  12/11/23 232 lb 12.8 oz (105.6 kg)  12/09/23 237 lb (107.5 kg)     ASSESSMENT & PLAN:   Nonobstructive CAD involving the native coronary arteries with chronic chest pain: Currently without symptoms of angina or cardiac decompensation.  Reports an approximate 30-day history of intermittent chest tightness.  He has undergone multiple ischemic evaluations that have demonstrated nonobstructive CAD, most recently by cardiac cath in 04/2023 that showed stable nonobstructive coronary anatomy when compared to cardiac cath from 2023.  Pursue coronary CTA to evaluate for progressive CAD as well as for evaluation of possible noncardiac etiology of chest tightness.  Obtain echo.  Query if tightness is in the setting of uncontrolled hypertension.  In this setting we will escalate amlodipine  to 10 mg daily.  Continue aggressive risk factor modification and primary prevention including amlodipine  81 mg and atorvastatin  40 mg.  Intolerant to atorvastatin  secondary to headache.  Has previously been prescribed metoprolol , though has not wanted to take this medication.  Check BMP and CBC.  Diastolic dysfunction: Euvolemic and well compensated.  Pursue optimal blood pressure control as outlined below.  Not requiring standing diuretic.  Update echo.  HTN: Blood pressure is elevated in the office today.  Titrate amlodipine  to 10 mg daily.  Otherwise remains on losartan  100 mg.  Continue escalation of antihypertensive pharmacotherapy as indicated in follow-up.  HLD: LDL 39 in 06/2023.  He remains on atorvastatin  40 mg.      Disposition: F/u with Dr. Gollan or an APP in 2 months.   Medication Adjustments/Labs and Tests Ordered: Current medicines are reviewed at length with the patient today.  Concerns regarding medicines are outlined above. Medication changes, Labs and Tests ordered today are summarized above and listed in the Patient Instructions accessible in Encounters.   Bonney Bernardino Bring, PA-C 02/14/2024 1:06 PM     [facility] [place] [address] [place], [place] [address] [phone]     [1]  Current Meds  Medication Sig   metoprolol  tartrate (LOPRESSOR ) 50 MG tablet TAKE 1 TABLET 2 HR PRIOR TO CARDIAC PROCEDURE   "

## 2024-02-14 ENCOUNTER — Encounter: Payer: Self-pay | Admitting: Physician Assistant

## 2024-02-14 ENCOUNTER — Other Ambulatory Visit: Payer: Self-pay

## 2024-02-14 ENCOUNTER — Ambulatory Visit: Attending: Physician Assistant | Admitting: Physician Assistant

## 2024-02-14 VITALS — BP 160/75 | HR 73 | Ht 69.0 in | Wt 238.0 lb

## 2024-02-14 DIAGNOSIS — I2510 Atherosclerotic heart disease of native coronary artery without angina pectoris: Secondary | ICD-10-CM | POA: Diagnosis not present

## 2024-02-14 DIAGNOSIS — I10 Essential (primary) hypertension: Secondary | ICD-10-CM

## 2024-02-14 DIAGNOSIS — R072 Precordial pain: Secondary | ICD-10-CM | POA: Diagnosis not present

## 2024-02-14 DIAGNOSIS — Z79899 Other long term (current) drug therapy: Secondary | ICD-10-CM | POA: Diagnosis not present

## 2024-02-14 DIAGNOSIS — I5189 Other ill-defined heart diseases: Secondary | ICD-10-CM | POA: Diagnosis not present

## 2024-02-14 DIAGNOSIS — E785 Hyperlipidemia, unspecified: Secondary | ICD-10-CM | POA: Diagnosis not present

## 2024-02-14 MED ORDER — AMLODIPINE BESYLATE 10 MG PO TABS
10.0000 mg | ORAL_TABLET | Freq: Every day | ORAL | 3 refills | Status: AC
  Filled 2024-02-14: qty 100, 100d supply, fill #0

## 2024-02-14 MED ORDER — NITROGLYCERIN 0.4 MG SL SUBL
0.4000 mg | SUBLINGUAL_TABLET | SUBLINGUAL | 0 refills | Status: AC | PRN
  Filled 2024-02-14: qty 50, 16d supply, fill #0

## 2024-02-14 MED ORDER — METOPROLOL TARTRATE 50 MG PO TABS
ORAL_TABLET | ORAL | 0 refills | Status: AC
  Filled 2024-02-14: qty 1, 1d supply, fill #0

## 2024-02-14 NOTE — Patient Instructions (Addendum)
 Medication Instructions:  Your physician recommends the following medication changes.  Continue all medications as prescribed; however,   INCREASE: AmLODipine  (NORVASC ) from 5 mg to 10 mg once daily  *If you need a refill on your cardiac medications before your next appointment, please call your pharmacy*  Lab Work: Your provider would like for you to have following labs drawn today CBC BMET.   If you have labs (blood work) drawn today and your tests are completely normal, you will receive your results only by: MyChart Message (if you have MyChart) OR A paper copy in the mail If you have any lab test that is abnormal or we need to change your treatment, we will call you to review the results.  Testing/Procedures:  Your physician has requested that you have an echocardiogram. Echocardiography is a painless test that uses sound waves to create images of your heart. It provides your doctor with information about the size and shape of your heart and how well your hearts chambers and valves are working.   You may receive an ultrasound enhancing agent through an IV if needed to better visualize your heart during the echo. This procedure takes approximately one hour.  There are no restrictions for this procedure.  This will take place at [address] [facility] [address]) [address], [place] [address]  Please note: We ask at that you not bring children with you during ultrasound (echo/ vascular) testing. Due to room size and safety concerns, children are not allowed in the ultrasound rooms during exams. Our front office staff cannot provide observation of children in our lobby area while testing is being conducted. An adult accompanying a patient to their appointment will only be allowed in the ultrasound room at the discretion of the ultrasound technician under special circumstances. We apologize for any inconvenience.     Your cardiac CT will be scheduled at one of the below locations:    [facility] [address] [place], [place] [address] [phone]  If scheduled at [facility] or [facility], please arrive 15 mins early for check-in and test prep.  There is spacious parking and easy access to the radiology department from the [facility] Heart and V[facility] entrance. Please enter here and check-in with the desk attendant.   Please follow these instructions carefully (unless otherwise directed):  An IV will be required for this test and Nitroglycerin  will be given.  Hold all erectile dysfunction medications at least 3 days (72 hrs) prior to test. (Ie viagra, cialis, sildenafil, tadalafil, etc)      On the Night Before the Test: Be sure to Drink plenty of water. Do not consume any caffeinated/decaffeinated beverages or chocolate 12 hours prior to your test. Do not take any antihistamines 12 hours prior to your test.  On the Day of the Test: Drink plenty of water until 1 hour prior to the test. Do not eat any food 1 hour prior to test. You may take your regular medications prior to the test.  Take metoprolol  (Lopressor ) 100 MG two hours prior to test.  (ONE TIME DOSE) HOLD your Furosemide  (LASIX ) 20 mg until after the CT Scan Patients who wear a continuous glucose monitor MUST remove the device prior to scanning.     After the Test: Drink plenty of water. After receiving IV contrast, you may experience a mild flushed feeling. This is normal. On occasion, you may experience a mild rash up to 24 hours after the  test. This is not dangerous. If this occurs, you can take Benadryl 25 mg, Zyrtec, Claritin, or Allegra and increase your fluid intake. (Patients taking Tikosyn should avoid Benadryl, and may take Zyrtec, Claritin, or Allegra) If you experience trouble breathing, this can be serious. If it is severe call 911 IMMEDIATELY. If it is mild, please call our office.  We will call to schedule your  test 2-4 weeks out understanding that some insurance companies will need an authorization prior to the service being performed.   For more information and frequently asked questions, please visit our website : [email or web address]  For non-scheduling related questions, please contact the cardiac imaging nurse navigator should you have any questions/concerns: Cardiac Imaging Nurse Navigators Direct Office Dial: [phone]   For scheduling needs, including cancellations and rescheduling, please call Brittany, [phone].    Follow-Up: At [facility], you and your health needs are our priority.  As part of our continuing mission to provide you with exceptional heart care, our providers are all part of one team.  This team includes your primary Cardiologist (physician) and Advanced Practice Providers or APPs (Physician Assistants and Nurse Practitioners) who all work together to provide you with the care you need, when you need it.  Your next appointment:   2 month(s)  Provider:   You may see Timothy Gollan, MD or one of the following Advanced Practice Providers on your designated Care Team:    Bernardino Bring, PA-C   We recommend signing up for the patient portal called MyChart.  Sign up information is provided on this After Visit Summary.  MyChart is used to connect with patients for Virtual Visits (Telemedicine).  Patients are able to view lab/test results, encounter notes, upcoming appointments, etc.  Non-urgent messages can be sent to your provider as well.   To learn more about what you can do with MyChart, go to [email or web address].

## 2024-02-15 ENCOUNTER — Other Ambulatory Visit: Payer: Self-pay | Admitting: Physician Assistant

## 2024-02-15 ENCOUNTER — Other Ambulatory Visit: Payer: Self-pay

## 2024-02-15 LAB — CBC
Hematocrit: 47.9 % (ref 37.5–51.0)
Hemoglobin: 16.4 g/dL (ref 13.0–17.7)
MCH: 34.8 pg — ABNORMAL HIGH (ref 26.6–33.0)
MCHC: 34.2 g/dL (ref 31.5–35.7)
MCV: 102 fL — ABNORMAL HIGH (ref 79–97)
Platelets: 264 x10E3/uL (ref 150–450)
RBC: 4.71 x10E6/uL (ref 4.14–5.80)
RDW: 11.7 % (ref 11.6–15.4)
WBC: 8.8 x10E3/uL (ref 3.4–10.8)

## 2024-02-15 LAB — BASIC METABOLIC PANEL WITH GFR
BUN/Creatinine Ratio: 14 (ref 10–24)
BUN: 15 mg/dL (ref 8–27)
CO2: 25 mmol/L (ref 20–29)
Calcium: 9.6 mg/dL (ref 8.6–10.2)
Chloride: 101 mmol/L (ref 96–106)
Creatinine, Ser: 1.04 mg/dL (ref 0.76–1.27)
Glucose: 92 mg/dL (ref 70–99)
Potassium: 4.2 mmol/L (ref 3.5–5.2)
Sodium: 142 mmol/L (ref 134–144)
eGFR: 75 mL/min/1.73

## 2024-02-15 MED FILL — Dulaglutide Soln Auto-injector 0.75 MG/0.5ML: SUBCUTANEOUS | 28 days supply | Qty: 2 | Fill #1 | Status: AC

## 2024-02-16 ENCOUNTER — Ambulatory Visit: Payer: Self-pay | Admitting: Physician Assistant

## 2024-02-17 ENCOUNTER — Other Ambulatory Visit: Payer: Self-pay

## 2024-02-18 ENCOUNTER — Other Ambulatory Visit: Payer: Self-pay | Admitting: Family Medicine

## 2024-02-18 ENCOUNTER — Other Ambulatory Visit: Payer: Self-pay

## 2024-02-18 DIAGNOSIS — I10 Essential (primary) hypertension: Secondary | ICD-10-CM

## 2024-02-18 DIAGNOSIS — E1169 Type 2 diabetes mellitus with other specified complication: Secondary | ICD-10-CM

## 2024-02-19 ENCOUNTER — Other Ambulatory Visit: Payer: Self-pay

## 2024-02-19 MED ORDER — ATORVASTATIN CALCIUM 40 MG PO TABS
40.0000 mg | ORAL_TABLET | Freq: Every day | ORAL | 0 refills | Status: DC
  Filled 2024-02-19: qty 90, 90d supply, fill #0

## 2024-02-19 MED ORDER — LOSARTAN POTASSIUM 100 MG PO TABS
100.0000 mg | ORAL_TABLET | Freq: Every day | ORAL | 0 refills | Status: DC
  Filled 2024-02-19: qty 90, 90d supply, fill #0

## 2024-02-26 ENCOUNTER — Ambulatory Visit: Attending: Physician Assistant

## 2024-02-26 DIAGNOSIS — I5189 Other ill-defined heart diseases: Secondary | ICD-10-CM | POA: Diagnosis not present

## 2024-02-26 DIAGNOSIS — R072 Precordial pain: Secondary | ICD-10-CM

## 2024-02-26 DIAGNOSIS — I2510 Atherosclerotic heart disease of native coronary artery without angina pectoris: Secondary | ICD-10-CM

## 2024-02-26 LAB — ECHOCARDIOGRAM COMPLETE
AR max vel: 3.95 cm2
AV Area VTI: 3.7 cm2
AV Area mean vel: 3.85 cm2
AV Mean grad: 4 mmHg
AV Peak grad: 7 mmHg
Ao pk vel: 1.32 m/s
Area-P 1/2: 2.83 cm2
S' Lateral: 2.65 cm

## 2024-02-27 ENCOUNTER — Telehealth: Payer: Self-pay

## 2024-02-27 NOTE — Telephone Encounter (Signed)
 Recent echo was overall reassuring, showing mildly stiff heart so good BP control is important to help manage this.  This was ordered by cardiology Bernardino Bring and he has already commented on results - anticipate the heart doctor's office will be reaching out to him shortly to review results in detail.

## 2024-02-27 NOTE — Telephone Encounter (Signed)
 Copied from CRM #[id number]. Topic: Clinical - Lab/Test Results >> Feb 27, 2024  8:45 AM Antwanette L wrote: Reason for CRM: Patient is requesting a callback from Dr. Cleatus to discuss echocardiogram results. The patient can be reached at [phone]   Called patient states that he was told he was getting changed to Dr. Cleatus. I have reviewed his chart think he may be talking about Cardiology Dr. Abigail. Advise patient I didn't see any documentation from our office about change. Let him know I will send message to Dr. KANDICE and will get further instructions from him. He asked that someone call him about results. I advised patient that this was ordered  by cardiology and he may want to reach out to that office for results. Patient declined would like call from Dr. Rilla or Dr. Cleatus.

## 2024-02-27 NOTE — Telephone Encounter (Signed)
 Called patient gave results as requested. He would like a call from Dr. KANDICE when he is back in the office. He has questions about a test that you wanted to have I'm done in [place]. Patient had a lot of questions about health including concerns about cancer and breathing. I have scheduled patient appointment in office next week to review questions with PCP. He is aware that he will not get call from our office tomorrow as requested but talk with provider in office at appointment.

## 2024-02-28 NOTE — Telephone Encounter (Signed)
 Noted. Agree OV is most appropriate. Thank you.

## 2024-02-28 NOTE — Telephone Encounter (Signed)
 I will defer- I am not involved in this case.  Thanks.

## 2024-03-03 ENCOUNTER — Other Ambulatory Visit: Payer: Self-pay

## 2024-03-03 ENCOUNTER — Other Ambulatory Visit: Payer: Self-pay | Admitting: Physician Assistant

## 2024-03-03 MED ORDER — FUROSEMIDE 20 MG PO TABS
20.0000 mg | ORAL_TABLET | Freq: Every day | ORAL | 5 refills | Status: AC
  Filled 2024-03-03 – 2024-03-04 (×3): qty 30, 30d supply, fill #0

## 2024-03-04 ENCOUNTER — Ambulatory Visit: Admitting: Family Medicine

## 2024-03-04 ENCOUNTER — Ambulatory Visit: Payer: Self-pay | Admitting: Family Medicine

## 2024-03-04 ENCOUNTER — Encounter: Payer: Self-pay | Admitting: Family Medicine

## 2024-03-04 ENCOUNTER — Other Ambulatory Visit: Payer: Self-pay

## 2024-03-04 VITALS — BP 138/58 | HR 72 | Temp 97.5°F | Ht 69.0 in | Wt 237.0 lb

## 2024-03-04 DIAGNOSIS — G4733 Obstructive sleep apnea (adult) (pediatric): Secondary | ICD-10-CM

## 2024-03-04 DIAGNOSIS — R0789 Other chest pain: Secondary | ICD-10-CM

## 2024-03-04 DIAGNOSIS — I10 Essential (primary) hypertension: Secondary | ICD-10-CM

## 2024-03-04 DIAGNOSIS — E1169 Type 2 diabetes mellitus with other specified complication: Secondary | ICD-10-CM

## 2024-03-04 DIAGNOSIS — I25118 Atherosclerotic heart disease of native coronary artery with other forms of angina pectoris: Secondary | ICD-10-CM

## 2024-03-04 DIAGNOSIS — G3184 Mild cognitive impairment, so stated: Secondary | ICD-10-CM

## 2024-03-04 LAB — POCT GLYCOSYLATED HEMOGLOBIN (HGB A1C): Hemoglobin A1C: 6.5 % — AB (ref 4.0–5.6)

## 2024-03-04 MED ORDER — TRULICITY 0.75 MG/0.5ML ~~LOC~~ SOAJ
0.7500 mg | SUBCUTANEOUS | 1 refills | Status: AC
  Filled 2024-03-04: qty 6, 84d supply, fill #0

## 2024-03-04 MED ORDER — GABAPENTIN 300 MG PO CAPS
300.0000 mg | ORAL_CAPSULE | Freq: Every day | ORAL | 1 refills | Status: AC
  Filled 2024-03-04: qty 90, 90d supply, fill #0

## 2024-03-04 MED ORDER — ATORVASTATIN CALCIUM 40 MG PO TABS
40.0000 mg | ORAL_TABLET | Freq: Every day | ORAL | 1 refills | Status: AC
  Filled 2024-03-04: qty 90, 90d supply, fill #0

## 2024-03-04 MED ORDER — LOSARTAN POTASSIUM 100 MG PO TABS
100.0000 mg | ORAL_TABLET | Freq: Every day | ORAL | 1 refills | Status: AC
  Filled 2024-03-04: qty 90, 90d supply, fill #0

## 2024-03-04 NOTE — Progress Notes (Unsigned)
 " Ph: [phone] Fax: [phone]   Patient ID: Casey Golden, male    DOB: [birth year], 74 y.o.   MRN: [id number]  This visit was conducted in person.  BP (!) 138/58 (BP Location: Right Arm, Cuff Size: Large)   Pulse 72   Temp (!) 97.5 F (36.4 C) (Oral)   Ht 5' 9 (1.753 m)   Wt 237 lb (107.5 kg)   SpO2 96%   BMI 35.00 kg/m    CC: follow up visit  Subjective:   HPI: Casey Golden is a 74 y.o. male presenting on 03/04/2024 for Medical Management of Chronic Issues (FU on Echo//Pt has been having costant pressure in chest for 2 weeks, reports slight SOB//Ella, granddaughter is in room)   Since last seek, had a fall 11/2023 with skin tear seen at [facility] practice treated with abx ointment/oral. Sine this also notes chest pressure and shortness of breath over the past 2 weeks. Notes constant substernal chest pressure, worse with exertional.   Saw cardiology Bernardino Bring PA for h/o nonobstructive CAD s/p several caths latest 04/2023 with planned continued medical management. Most recently amlodipine  was increased to 10mg  daily due to uncontrolled blood pressures, planned 2 mo f/u. Updated echocardiogram overall reassuring, pending CTA chest for further evaluation 2/9.   Dx MCI with memory loss 11/2022. Aricept  caused headache. Continues vit E 1000 units BID. Saw neurology Dr Buck 09/2023 - started on Namenda  5mg  BID.   DM - only on Trulicity  0.75mg  weekly, metformin  stopped due to great control.  Lab Results  Component Value Date   HGBA1C 6.5 (A) 03/04/2024         Relevant past medical, surgical, family and social history reviewed and updated as indicated. Interim medical history since our last visit reviewed. Allergies and medications reviewed and updated. Outpatient Medications Prior to Visit  Medication Sig Dispense Refill   Accu-Chek Softclix Lancets lancets Use to check blood glucose 3 times a day 100 each 0   amLODipine  (NORVASC ) 10 MG tablet Take 1 tablet  (10 mg total) by mouth daily. 180 tablet 3   aspirin  81 MG EC tablet Take 1 tablet (81 mg total) by mouth daily. 30 tablet 12   bacitracin  500 UNIT/GM ointment Apply 1 Application topically 2 (two) times daily. 28 g 0   Blood Glucose Monitoring Suppl (BLOOD GLUCOSE MONITOR SYSTEM) w/Device KIT Use to check blood sugar 3 (three) times daily. 1 kit 0   cyanocobalamin  (VITAMIN B12) 1000 MCG tablet Take 1 tablet (1,000 mcg total) by mouth every Monday, Wednesday, and Friday.     diclofenac  sodium (VOLTAREN ) 1 % GEL APPLY 2 G TOPICALLY 3 (THREE) TIMES DAILY AS NEEDED (ANTI INFLAMMATORY). 100 g 1   furosemide  (LASIX ) 20 MG tablet Take 1 tablet (20 mg total) by mouth daily. 30 tablet 5   glucose blood test strip Use to check blood glucose 3 times a day 100 each 0   memantine  (NAMENDA ) 5 MG tablet Take 1 tablet (5 mg total) by mouth 2 (two) times daily. 60 tablet 5   metoprolol  tartrate (LOPRESSOR ) 50 MG tablet TAKE 1 TABLET 2 HR PRIOR TO CARDIAC PROCEDURE 1 tablet 0   Multiple Vitamin (MULTIVITAMIN ADULT) TABS Take 1 tablet by mouth daily.     nitroGLYCERIN  (NITROSTAT ) 0.4 MG SL tablet Place 1 tablet (0.4 mg total) under the tongue every 5 (five) minutes as needed for chest pain (do not take more than 3 doses.). 50 tablet 0   vitamin  E 1000 UNIT capsule Take 1 capsule (1,000 Units total) by mouth in the morning and at bedtime.     atorvastatin  (LIPITOR) 40 MG tablet Take 1 tablet (40 mg total) by mouth daily. 90 tablet 0   Dulaglutide  (TRULICITY ) 0.75 MG/0.5ML SOAJ Inject 0.75 mg into the skin once a week. 2 mL 6   gabapentin  (NEURONTIN ) 300 MG capsule Take 1 capsule (300 mg total) by mouth at bedtime. 90 capsule 1   losartan  (COZAAR ) 100 MG tablet Take 1 tablet (100 mg total) by mouth daily. 90 tablet 0   doxycycline  (VIBRA -TABS) 100 MG tablet Take 1 tablet (100 mg total) by mouth 2 (two) times daily. 10 tablet 0   No facility-administered medications prior to visit.     Per HPI unless specifically  indicated in ROS section below Review of Systems  Objective:  BP (!) 138/58 (BP Location: Right Arm, Cuff Size: Large)   Pulse 72   Temp (!) 97.5 F (36.4 C) (Oral)   Ht 5' 9 (1.753 m)   Wt 237 lb (107.5 kg)   SpO2 96%   BMI 35.00 kg/m   Wt Readings from Last 3 Encounters:  03/04/24 237 lb (107.5 kg)  02/14/24 238 lb (108 kg)  12/11/23 232 lb 12.8 oz (105.6 kg)      Physical Exam Vitals and nursing note reviewed.  Constitutional:      Appearance: Normal appearance. He is not ill-appearing.  HENT:     Head: Normocephalic and atraumatic.     Mouth/Throat:     Mouth: Mucous membranes are moist.     Pharynx: Oropharynx is clear. No oropharyngeal exudate or posterior oropharyngeal erythema.  Eyes:     Extraocular Movements: Extraocular movements intact.     Conjunctiva/sclera: Conjunctivae normal.     Pupils: Pupils are equal, round, and reactive to light.  Cardiovascular:     Rate and Rhythm: Normal rate and regular rhythm.     Pulses: Normal pulses.     Heart sounds: Normal heart sounds. No murmur heard. Pulmonary:     Effort: Pulmonary effort is normal. No respiratory distress.     Breath sounds: Normal breath sounds. No wheezing, rhonchi or rales.  Chest:     Chest wall: Tenderness (reproducible tenderness to palpation of mid sternum) present.  Musculoskeletal:     Cervical back: Normal range of motion and neck supple.     Right lower leg: No edema.     Left lower leg: No edema.  Skin:    General: Skin is warm and dry.     Findings: No rash.  Neurological:     Mental Status: He is alert.  Psychiatric:        Mood and Affect: Mood normal.        Behavior: Behavior normal.       Results for orders placed or performed in visit on 03/04/24  HgB A1c   Collection Time: 03/04/24  2:19 PM  Result Value Ref Range   Hemoglobin A1C 6.5 (A) 4.0 - 5.6 %   HbA1c POC (<> result, manual entry)     HbA1c, POC (prediabetic range)     HbA1c, POC (controlled diabetic range)       Assessment & Plan:   Problem List Items Addressed This Visit     HTN (hypertension)   Relevant Medications   atorvastatin  (LIPITOR) 40 MG tablet   losartan  (COZAAR ) 100 MG tablet   Type 2 diabetes mellitus with other specified complication (HCC) -  Primary   Relevant Medications   Dulaglutide  (TRULICITY ) 0.75 MG/0.5ML SOAJ   atorvastatin  (LIPITOR) 40 MG tablet   losartan  (COZAAR ) 100 MG tablet   Other Relevant Orders   HgB A1c (Completed)   Dyslipidemia associated with type 2 diabetes mellitus (HCC)   Relevant Medications   Dulaglutide  (TRULICITY ) 0.75 MG/0.5ML SOAJ   atorvastatin  (LIPITOR) 40 MG tablet   losartan  (COZAAR ) 100 MG tablet     Meds ordered this encounter  Medications   gabapentin  (NEURONTIN ) 300 MG capsule    Sig: Take 1 capsule (300 mg total) by mouth at bedtime.    Dispense:  90 capsule    Refill:  1   Dulaglutide  (TRULICITY ) 0.75 MG/0.5ML SOAJ    Sig: Inject 0.75 mg into the skin once a week.    Dispense:  6 mL    Refill:  1   atorvastatin  (LIPITOR) 40 MG tablet    Sig: Take 1 tablet (40 mg total) by mouth daily.    Dispense:  90 tablet    Refill:  1   losartan  (COZAAR ) 100 MG tablet    Sig: Take 1 tablet (100 mg total) by mouth daily.    Dispense:  90 tablet    Refill:  1    Orders Placed This Encounter  Procedures   HgB A1c    Patient Instructions  I'm glad you have scheduled CT chest for next week to further evaluate for cardiac cause of chest pain but given reproducible nature of pain noted today, this could also be musculoskeletal cause like pain coming from chest bone.  May try heating pad, icy hot, voltaren  gel over the counter.  Avoid heavy exertion until you have your CT scan.  Continue current medicines, medicine list updated.  Call Dr Obie office at [phone] to schedule follow up visit Reschedule diabetes follow up visit to 3-4 months from now.   Follow up plan: Return in about 3 months (around 06/01/2024) for follow up  visit.  Anton Blas, MD   "

## 2024-03-04 NOTE — Patient Instructions (Addendum)
 I'm glad you have scheduled CT chest for next week to further evaluate for cardiac cause of chest pain but given reproducible nature of pain noted today, this could also be musculoskeletal cause like pain coming from chest bone.  May try heating pad, icy hot, voltaren  gel over the counter.  Avoid heavy exertion until you have your CT scan.  Continue current medicines, medicine list updated.  Call Dr Obie office at [phone] to schedule follow up visit Reschedule diabetes follow up visit to 3-4 months from now.

## 2024-03-05 ENCOUNTER — Other Ambulatory Visit: Payer: Self-pay

## 2024-03-05 ENCOUNTER — Encounter (HOSPITAL_COMMUNITY): Payer: Self-pay

## 2024-03-05 NOTE — Assessment & Plan Note (Addendum)
 Some confusion based on recent phone note - he thought PCP was changing to Dr Cleatus.  Continue namenda  5mg  bid through neurology, continue vitamin E  1000 units bid.  Donepezil  intolerance.  Wife manages medications.  Rec call to schedule neuro f/u as about due.

## 2024-03-05 NOTE — Assessment & Plan Note (Addendum)
 Both typical and atypical features, with some reproducibility to sternal palpation suggesting MSK component. Discussed supportive measures including heating pad, topical voltaren  or topical muscle rub.  Recent echocardiogram was reassuring.  Await results of coronary morphology CTA scheduled for later this week.  Discussed PRN SL nitroglycerin  use - he should have Rx available at home or at pharmacy.

## 2024-03-05 NOTE — Assessment & Plan Note (Signed)
 Chronic, well controlled only on Trulicity  0.75mg  weekly - continue.

## 2024-03-05 NOTE — Assessment & Plan Note (Signed)
 Followed by neurology.  Rpt HST ordered but not completed.

## 2024-03-05 NOTE — Assessment & Plan Note (Signed)
 Atorvastatin  refilled - continue. FLP well controlled on this regimen with LDL 39.

## 2024-03-05 NOTE — Assessment & Plan Note (Addendum)
 H/o this. Appreciate cardiology care. Pending coronary morphology CTA later this week for further evaluation.  Isosorbide  intolerance.  Amlodipine  recently increased.  Continue aspirin , atorvastatin .

## 2024-03-05 NOTE — Assessment & Plan Note (Addendum)
 Chronic, improved control since amlodipine  dose increased - continue this along with losartan  100mg  daily and lasix  20mg  daily.

## 2024-03-09 ENCOUNTER — Ambulatory Visit

## 2024-04-17 ENCOUNTER — Ambulatory Visit: Admitting: Physician Assistant

## 2024-04-20 ENCOUNTER — Ambulatory Visit: Admitting: Family Medicine

## 2024-07-20 ENCOUNTER — Ambulatory Visit

## 2024-07-23 ENCOUNTER — Ambulatory Visit
